# Patient Record
Sex: Male | Born: 1945 | Race: White | Hispanic: No | Marital: Married | Smoking: Former smoker
Health system: Southern US, Community
[De-identification: ages and names within clinical notes are randomized; demographics above are authoritative.]

## PROBLEM LIST (undated history)

## (undated) DIAGNOSIS — I2510 Atherosclerotic heart disease of native coronary artery without angina pectoris: Secondary | ICD-10-CM

## (undated) DIAGNOSIS — E785 Hyperlipidemia, unspecified: Secondary | ICD-10-CM

## (undated) HISTORY — PX: SKIN SURGERY: SHX2413

---

## 2010-12-16 HISTORY — PX: CORONARY ARTERY BYPASS GRAFT: SHX141

## 2011-05-02 DIAGNOSIS — I10 Essential (primary) hypertension: Secondary | ICD-10-CM | POA: Insufficient documentation

## 2011-05-07 DIAGNOSIS — I2510 Atherosclerotic heart disease of native coronary artery without angina pectoris: Secondary | ICD-10-CM | POA: Insufficient documentation

## 2011-05-28 DIAGNOSIS — Z951 Presence of aortocoronary bypass graft: Secondary | ICD-10-CM | POA: Insufficient documentation

## 2011-05-30 DIAGNOSIS — E785 Hyperlipidemia, unspecified: Secondary | ICD-10-CM | POA: Insufficient documentation

## 2013-04-14 DIAGNOSIS — R059 Cough, unspecified: Secondary | ICD-10-CM | POA: Insufficient documentation

## 2015-04-20 DIAGNOSIS — H9193 Unspecified hearing loss, bilateral: Secondary | ICD-10-CM | POA: Insufficient documentation

## 2015-04-20 DIAGNOSIS — H6123 Impacted cerumen, bilateral: Secondary | ICD-10-CM | POA: Insufficient documentation

## 2015-07-07 DIAGNOSIS — M25522 Pain in left elbow: Secondary | ICD-10-CM | POA: Insufficient documentation

## 2015-09-19 DIAGNOSIS — K759 Inflammatory liver disease, unspecified: Secondary | ICD-10-CM | POA: Insufficient documentation

## 2017-02-13 DIAGNOSIS — IMO0002 Reserved for concepts with insufficient information to code with codable children: Secondary | ICD-10-CM | POA: Insufficient documentation

## 2018-02-10 DIAGNOSIS — R972 Elevated prostate specific antigen [PSA]: Secondary | ICD-10-CM | POA: Insufficient documentation

## 2018-02-10 DIAGNOSIS — R310 Gross hematuria: Secondary | ICD-10-CM | POA: Insufficient documentation

## 2018-03-30 DIAGNOSIS — C61 Malignant neoplasm of prostate: Secondary | ICD-10-CM | POA: Insufficient documentation

## 2018-04-16 DIAGNOSIS — M542 Cervicalgia: Secondary | ICD-10-CM | POA: Insufficient documentation

## 2018-08-10 DIAGNOSIS — M25552 Pain in left hip: Secondary | ICD-10-CM | POA: Insufficient documentation

## 2019-02-25 DIAGNOSIS — M1612 Unilateral primary osteoarthritis, left hip: Secondary | ICD-10-CM | POA: Insufficient documentation

## 2019-04-12 DIAGNOSIS — M7989 Other specified soft tissue disorders: Secondary | ICD-10-CM | POA: Insufficient documentation

## 2019-06-02 DIAGNOSIS — Z96642 Presence of left artificial hip joint: Secondary | ICD-10-CM | POA: Insufficient documentation

## 2019-06-02 DIAGNOSIS — Z471 Aftercare following joint replacement surgery: Secondary | ICD-10-CM | POA: Insufficient documentation

## 2019-06-15 DIAGNOSIS — M720 Palmar fascial fibromatosis [Dupuytren]: Secondary | ICD-10-CM | POA: Insufficient documentation

## 2019-06-15 DIAGNOSIS — S99922A Unspecified injury of left foot, initial encounter: Secondary | ICD-10-CM | POA: Insufficient documentation

## 2019-10-22 DIAGNOSIS — K921 Melena: Secondary | ICD-10-CM | POA: Insufficient documentation

## 2020-01-07 DIAGNOSIS — N201 Calculus of ureter: Secondary | ICD-10-CM | POA: Insufficient documentation

## 2020-03-03 DIAGNOSIS — M6530 Trigger finger, unspecified finger: Secondary | ICD-10-CM | POA: Insufficient documentation

## 2021-10-31 DIAGNOSIS — W19XXXA Unspecified fall, initial encounter: Secondary | ICD-10-CM | POA: Insufficient documentation

## 2021-11-25 ENCOUNTER — Inpatient Hospital Stay (HOSPITAL_COMMUNITY)
Admission: EM | Admit: 2021-11-25 | Discharge: 2021-12-25 | DRG: 003 | Disposition: A | Discharge status: Rehabilitation Facility | Payer: Medicare Other | Attending: Family Medicine | Admitting: Family Medicine

## 2021-11-25 ENCOUNTER — Other Ambulatory Visit: Payer: Self-pay

## 2021-11-25 ENCOUNTER — Emergency Department (HOSPITAL_COMMUNITY): Payer: Medicare Other

## 2021-11-25 ENCOUNTER — Inpatient Hospital Stay (HOSPITAL_COMMUNITY): Payer: Medicare Other

## 2021-11-25 ENCOUNTER — Encounter (HOSPITAL_COMMUNITY): Payer: Self-pay | Admitting: Neurology

## 2021-11-25 DIAGNOSIS — F419 Anxiety disorder, unspecified: Secondary | ICD-10-CM | POA: Diagnosis not present

## 2021-11-25 DIAGNOSIS — I63531 Cerebral infarction due to unspecified occlusion or stenosis of right posterior cerebral artery: Secondary | ICD-10-CM | POA: Diagnosis not present

## 2021-11-25 DIAGNOSIS — J690 Pneumonitis due to inhalation of food and vomit: Secondary | ICD-10-CM

## 2021-11-25 DIAGNOSIS — G936 Cerebral edema: Secondary | ICD-10-CM | POA: Diagnosis not present

## 2021-11-25 DIAGNOSIS — I959 Hypotension, unspecified: Secondary | ICD-10-CM | POA: Diagnosis not present

## 2021-11-25 DIAGNOSIS — I615 Nontraumatic intracerebral hemorrhage, intraventricular: Secondary | ICD-10-CM | POA: Diagnosis not present

## 2021-11-25 DIAGNOSIS — I4892 Unspecified atrial flutter: Secondary | ICD-10-CM | POA: Diagnosis present

## 2021-11-25 DIAGNOSIS — R1311 Dysphagia, oral phase: Secondary | ICD-10-CM | POA: Diagnosis not present

## 2021-11-25 DIAGNOSIS — E162 Hypoglycemia, unspecified: Secondary | ICD-10-CM | POA: Diagnosis not present

## 2021-11-25 DIAGNOSIS — Z951 Presence of aortocoronary bypass graft: Secondary | ICD-10-CM

## 2021-11-25 DIAGNOSIS — N401 Enlarged prostate with lower urinary tract symptoms: Secondary | ICD-10-CM | POA: Diagnosis present

## 2021-11-25 DIAGNOSIS — R310 Gross hematuria: Secondary | ICD-10-CM | POA: Diagnosis not present

## 2021-11-25 DIAGNOSIS — D62 Acute posthemorrhagic anemia: Secondary | ICD-10-CM | POA: Diagnosis not present

## 2021-11-25 DIAGNOSIS — Z931 Gastrostomy status: Secondary | ICD-10-CM | POA: Diagnosis not present

## 2021-11-25 DIAGNOSIS — I639 Cerebral infarction, unspecified: Secondary | ICD-10-CM

## 2021-11-25 DIAGNOSIS — R1310 Dysphagia, unspecified: Secondary | ICD-10-CM | POA: Diagnosis not present

## 2021-11-25 DIAGNOSIS — I69393 Ataxia following cerebral infarction: Secondary | ICD-10-CM | POA: Diagnosis present

## 2021-11-25 DIAGNOSIS — J9601 Acute respiratory failure with hypoxia: Secondary | ICD-10-CM

## 2021-11-25 DIAGNOSIS — R1313 Dysphagia, pharyngeal phase: Secondary | ICD-10-CM | POA: Diagnosis present

## 2021-11-25 DIAGNOSIS — I700 Atherosclerosis of aorta: Secondary | ICD-10-CM | POA: Diagnosis present

## 2021-11-25 DIAGNOSIS — J9600 Acute respiratory failure, unspecified whether with hypoxia or hypercapnia: Secondary | ICD-10-CM

## 2021-11-25 DIAGNOSIS — I63543 Cerebral infarction due to unspecified occlusion or stenosis of bilateral cerebellar arteries: Secondary | ICD-10-CM | POA: Diagnosis present

## 2021-11-25 DIAGNOSIS — R0602 Shortness of breath: Secondary | ICD-10-CM

## 2021-11-25 DIAGNOSIS — Z20822 Contact with and (suspected) exposure to covid-19: Secondary | ICD-10-CM | POA: Diagnosis present

## 2021-11-25 DIAGNOSIS — G935 Compression of brain: Secondary | ICD-10-CM | POA: Diagnosis not present

## 2021-11-25 DIAGNOSIS — Z0189 Encounter for other specified special examinations: Secondary | ICD-10-CM

## 2021-11-25 DIAGNOSIS — K648 Other hemorrhoids: Secondary | ICD-10-CM | POA: Diagnosis present

## 2021-11-25 DIAGNOSIS — I63013 Cerebral infarction due to thrombosis of bilateral vertebral arteries: Secondary | ICD-10-CM | POA: Diagnosis not present

## 2021-11-25 DIAGNOSIS — I2510 Atherosclerotic heart disease of native coronary artery without angina pectoris: Secondary | ICD-10-CM | POA: Diagnosis present

## 2021-11-25 DIAGNOSIS — I69391 Dysphagia following cerebral infarction: Secondary | ICD-10-CM | POA: Diagnosis not present

## 2021-11-25 DIAGNOSIS — I63443 Cerebral infarction due to embolism of bilateral cerebellar arteries: Secondary | ICD-10-CM | POA: Diagnosis not present

## 2021-11-25 DIAGNOSIS — D72829 Elevated white blood cell count, unspecified: Secondary | ICD-10-CM | POA: Diagnosis not present

## 2021-11-25 DIAGNOSIS — J9602 Acute respiratory failure with hypercapnia: Secondary | ICD-10-CM | POA: Diagnosis not present

## 2021-11-25 DIAGNOSIS — R29702 NIHSS score 2: Secondary | ICD-10-CM | POA: Diagnosis present

## 2021-11-25 DIAGNOSIS — I4891 Unspecified atrial fibrillation: Secondary | ICD-10-CM | POA: Diagnosis not present

## 2021-11-25 DIAGNOSIS — R4701 Aphasia: Secondary | ICD-10-CM | POA: Diagnosis not present

## 2021-11-25 DIAGNOSIS — I6389 Other cerebral infarction: Secondary | ICD-10-CM | POA: Diagnosis not present

## 2021-11-25 DIAGNOSIS — H510 Palsy (spasm) of conjugate gaze: Secondary | ICD-10-CM | POA: Diagnosis present

## 2021-11-25 DIAGNOSIS — I110 Hypertensive heart disease with heart failure: Secondary | ICD-10-CM | POA: Diagnosis present

## 2021-11-25 DIAGNOSIS — I10 Essential (primary) hypertension: Secondary | ICD-10-CM | POA: Diagnosis not present

## 2021-11-25 DIAGNOSIS — Z930 Tracheostomy status: Secondary | ICD-10-CM

## 2021-11-25 DIAGNOSIS — R569 Unspecified convulsions: Secondary | ICD-10-CM | POA: Diagnosis not present

## 2021-11-25 DIAGNOSIS — A499 Bacterial infection, unspecified: Secondary | ICD-10-CM | POA: Diagnosis not present

## 2021-11-25 DIAGNOSIS — M1990 Unspecified osteoarthritis, unspecified site: Secondary | ICD-10-CM | POA: Diagnosis present

## 2021-11-25 DIAGNOSIS — E785 Hyperlipidemia, unspecified: Secondary | ICD-10-CM | POA: Diagnosis present

## 2021-11-25 DIAGNOSIS — Z8546 Personal history of malignant neoplasm of prostate: Secondary | ICD-10-CM

## 2021-11-25 DIAGNOSIS — E870 Hyperosmolality and hypernatremia: Secondary | ICD-10-CM | POA: Diagnosis not present

## 2021-11-25 DIAGNOSIS — G9341 Metabolic encephalopathy: Secondary | ICD-10-CM | POA: Diagnosis not present

## 2021-11-25 DIAGNOSIS — N3040 Irradiation cystitis without hematuria: Secondary | ICD-10-CM | POA: Diagnosis not present

## 2021-11-25 DIAGNOSIS — R509 Fever, unspecified: Secondary | ICD-10-CM | POA: Diagnosis not present

## 2021-11-25 DIAGNOSIS — I340 Nonrheumatic mitral (valve) insufficiency: Secondary | ICD-10-CM | POA: Diagnosis present

## 2021-11-25 DIAGNOSIS — I483 Typical atrial flutter: Secondary | ICD-10-CM | POA: Diagnosis not present

## 2021-11-25 DIAGNOSIS — Z79899 Other long term (current) drug therapy: Secondary | ICD-10-CM

## 2021-11-25 DIAGNOSIS — I69351 Hemiplegia and hemiparesis following cerebral infarction affecting right dominant side: Secondary | ICD-10-CM | POA: Diagnosis not present

## 2021-11-25 DIAGNOSIS — R410 Disorientation, unspecified: Secondary | ICD-10-CM | POA: Diagnosis not present

## 2021-11-25 DIAGNOSIS — R001 Bradycardia, unspecified: Secondary | ICD-10-CM | POA: Diagnosis not present

## 2021-11-25 DIAGNOSIS — N390 Urinary tract infection, site not specified: Secondary | ICD-10-CM | POA: Diagnosis not present

## 2021-11-25 DIAGNOSIS — Z978 Presence of other specified devices: Secondary | ICD-10-CM

## 2021-11-25 DIAGNOSIS — N210 Calculus in bladder: Secondary | ICD-10-CM | POA: Diagnosis present

## 2021-11-25 DIAGNOSIS — W19XXXA Unspecified fall, initial encounter: Secondary | ICD-10-CM | POA: Diagnosis not present

## 2021-11-25 DIAGNOSIS — E669 Obesity, unspecified: Secondary | ICD-10-CM | POA: Diagnosis present

## 2021-11-25 DIAGNOSIS — I5032 Chronic diastolic (congestive) heart failure: Secondary | ICD-10-CM | POA: Diagnosis present

## 2021-11-25 DIAGNOSIS — Z8249 Family history of ischemic heart disease and other diseases of the circulatory system: Secondary | ICD-10-CM

## 2021-11-25 DIAGNOSIS — Y842 Radiological procedure and radiotherapy as the cause of abnormal reaction of the patient, or of later complication, without mention of misadventure at the time of the procedure: Secondary | ICD-10-CM | POA: Diagnosis present

## 2021-11-25 DIAGNOSIS — Z781 Physical restraint status: Secondary | ICD-10-CM

## 2021-11-25 DIAGNOSIS — R338 Other retention of urine: Secondary | ICD-10-CM | POA: Diagnosis present

## 2021-11-25 DIAGNOSIS — K5790 Diverticulosis of intestine, part unspecified, without perforation or abscess without bleeding: Secondary | ICD-10-CM | POA: Diagnosis present

## 2021-11-25 DIAGNOSIS — I480 Paroxysmal atrial fibrillation: Secondary | ICD-10-CM | POA: Diagnosis present

## 2021-11-25 DIAGNOSIS — I160 Hypertensive urgency: Secondary | ICD-10-CM | POA: Diagnosis not present

## 2021-11-25 DIAGNOSIS — E876 Hypokalemia: Secondary | ICD-10-CM | POA: Diagnosis not present

## 2021-11-25 DIAGNOSIS — Z7982 Long term (current) use of aspirin: Secondary | ICD-10-CM | POA: Diagnosis not present

## 2021-11-25 DIAGNOSIS — Z96642 Presence of left artificial hip joint: Secondary | ICD-10-CM | POA: Diagnosis present

## 2021-11-25 DIAGNOSIS — Z6831 Body mass index (BMI) 31.0-31.9, adult: Secondary | ICD-10-CM

## 2021-11-25 DIAGNOSIS — R319 Hematuria, unspecified: Secondary | ICD-10-CM | POA: Diagnosis not present

## 2021-11-25 DIAGNOSIS — R1312 Dysphagia, oropharyngeal phase: Secondary | ICD-10-CM | POA: Diagnosis not present

## 2021-11-25 DIAGNOSIS — Z7189 Other specified counseling: Secondary | ICD-10-CM | POA: Diagnosis not present

## 2021-11-25 DIAGNOSIS — R471 Dysarthria and anarthria: Secondary | ICD-10-CM | POA: Diagnosis not present

## 2021-11-25 DIAGNOSIS — H9190 Unspecified hearing loss, unspecified ear: Secondary | ICD-10-CM | POA: Diagnosis present

## 2021-11-25 DIAGNOSIS — I6300 Cerebral infarction due to thrombosis of unspecified precerebral artery: Secondary | ICD-10-CM | POA: Diagnosis not present

## 2021-11-25 DIAGNOSIS — K921 Melena: Secondary | ICD-10-CM | POA: Diagnosis not present

## 2021-11-25 DIAGNOSIS — K625 Hemorrhage of anus and rectum: Secondary | ICD-10-CM

## 2021-11-25 DIAGNOSIS — Z9889 Other specified postprocedural states: Secondary | ICD-10-CM | POA: Diagnosis not present

## 2021-11-25 DIAGNOSIS — Z4659 Encounter for fitting and adjustment of other gastrointestinal appliance and device: Secondary | ICD-10-CM

## 2021-11-25 DIAGNOSIS — Z87891 Personal history of nicotine dependence: Secondary | ICD-10-CM

## 2021-11-25 DIAGNOSIS — F05 Delirium due to known physiological condition: Secondary | ICD-10-CM | POA: Diagnosis not present

## 2021-11-25 DIAGNOSIS — R451 Restlessness and agitation: Secondary | ICD-10-CM | POA: Diagnosis not present

## 2021-11-25 DIAGNOSIS — I69322 Dysarthria following cerebral infarction: Secondary | ICD-10-CM | POA: Diagnosis not present

## 2021-11-25 DIAGNOSIS — N029 Recurrent and persistent hematuria with unspecified morphologic changes: Secondary | ICD-10-CM | POA: Diagnosis present

## 2021-11-25 DIAGNOSIS — Z515 Encounter for palliative care: Secondary | ICD-10-CM | POA: Diagnosis not present

## 2021-11-25 DIAGNOSIS — Z809 Family history of malignant neoplasm, unspecified: Secondary | ICD-10-CM

## 2021-11-25 HISTORY — DX: Atherosclerotic heart disease of native coronary artery without angina pectoris: I25.10

## 2021-11-25 HISTORY — DX: Hyperlipidemia, unspecified: E78.5

## 2021-11-25 LAB — CBC
HCT: 48.7 % (ref 39.0–52.0)
Hemoglobin: 16.6 g/dL (ref 13.0–17.0)
MCH: 30.9 pg (ref 26.0–34.0)
MCHC: 34.1 g/dL (ref 30.0–36.0)
MCV: 90.7 fL (ref 80.0–100.0)
Platelets: 223 10*3/uL (ref 150–400)
RBC: 5.37 MIL/uL (ref 4.22–5.81)
RDW: 14.3 % (ref 11.5–15.5)
WBC: 6.8 10*3/uL (ref 4.0–10.5)
nRBC: 0 % (ref 0.0–0.2)

## 2021-11-25 LAB — URINALYSIS, ROUTINE W REFLEX MICROSCOPIC
Bacteria, UA: NONE SEEN
Bilirubin Urine: NEGATIVE
Glucose, UA: 50 mg/dL — AB
Ketones, ur: 20 mg/dL — AB
Leukocytes,Ua: NEGATIVE
Nitrite: NEGATIVE
Protein, ur: NEGATIVE mg/dL
Specific Gravity, Urine: 1.033 — ABNORMAL HIGH (ref 1.005–1.030)
pH: 6 (ref 5.0–8.0)

## 2021-11-25 LAB — COMPREHENSIVE METABOLIC PANEL
ALT: 29 U/L (ref 0–44)
AST: 31 U/L (ref 15–41)
Albumin: 3.9 g/dL (ref 3.5–5.0)
Alkaline Phosphatase: 86 U/L (ref 38–126)
Anion gap: 12 (ref 5–15)
BUN: 18 mg/dL (ref 8–23)
CO2: 22 mmol/L (ref 22–32)
Calcium: 9.1 mg/dL (ref 8.9–10.3)
Chloride: 105 mmol/L (ref 98–111)
Creatinine, Ser: 1.02 mg/dL (ref 0.61–1.24)
GFR, Estimated: >60 mL/min (ref >60)
Glucose, Bld: 134 mg/dL — ABNORMAL HIGH (ref 70–99)
Potassium: 3.8 mmol/L (ref 3.5–5.1)
Sodium: 139 mmol/L (ref 135–145)
Total Bilirubin: 1.2 mg/dL (ref 0.3–1.2)
Total Protein: 6.9 g/dL (ref 6.5–8.1)

## 2021-11-25 LAB — ABO/RH: ABO/RH(D): A POS

## 2021-11-25 LAB — DIFFERENTIAL
Abs Immature Granulocytes: 0.02 10*3/uL (ref 0.00–0.07)
Basophils Absolute: 0.1 10*3/uL (ref 0.0–0.1)
Basophils Relative: 1 %
Eosinophils Absolute: 0.2 10*3/uL (ref 0.0–0.5)
Eosinophils Relative: 3 %
Immature Granulocytes: 0 %
Lymphocytes Relative: 37 %
Lymphs Abs: 2.5 10*3/uL (ref 0.7–4.0)
Monocytes Absolute: 0.7 10*3/uL (ref 0.1–1.0)
Monocytes Relative: 10 %
Neutro Abs: 3.3 10*3/uL (ref 1.7–7.7)
Neutrophils Relative %: 49 %

## 2021-11-25 LAB — I-STAT CHEM 8, ED
BUN: 21 mg/dL (ref 8–23)
Calcium, Ion: 1 mmol/L — ABNORMAL LOW (ref 1.15–1.40)
Chloride: 108 mmol/L (ref 98–111)
Creatinine, Ser: 0.9 mg/dL (ref 0.61–1.24)
Glucose, Bld: 135 mg/dL — ABNORMAL HIGH (ref 70–99)
HCT: 49 % (ref 39.0–52.0)
Hemoglobin: 16.7 g/dL (ref 13.0–17.0)
Potassium: 3.6 mmol/L (ref 3.5–5.1)
Sodium: 140 mmol/L (ref 135–145)
TCO2: 23 mmol/L (ref 22–32)

## 2021-11-25 LAB — TYPE AND SCREEN
ABO/RH(D): A POS
Antibody Screen: NEGATIVE

## 2021-11-25 LAB — APTT: aPTT: 26 seconds (ref 24–36)

## 2021-11-25 LAB — RESP PANEL BY RT-PCR (FLU A&B, COVID) ARPGX2
Influenza A by PCR: NEGATIVE
Influenza B by PCR: NEGATIVE
SARS Coronavirus 2 by RT PCR: NEGATIVE

## 2021-11-25 LAB — RAPID URINE DRUG SCREEN, HOSP PERFORMED
Amphetamines: NOT DETECTED
Barbiturates: NOT DETECTED
Benzodiazepines: NOT DETECTED
Cocaine: NOT DETECTED
Opiates: NOT DETECTED
Tetrahydrocannabinol: NOT DETECTED

## 2021-11-25 LAB — PROTIME-INR
INR: 1 (ref 0.8–1.2)
Prothrombin Time: 13.2 seconds (ref 11.4–15.2)

## 2021-11-25 LAB — CBG MONITORING, ED
Glucose-Capillary: 132 mg/dL — ABNORMAL HIGH (ref 70–99)
Glucose-Capillary: 141 mg/dL — ABNORMAL HIGH (ref 70–99)

## 2021-11-25 LAB — MRSA NEXT GEN BY PCR, NASAL: MRSA by PCR Next Gen: NOT DETECTED

## 2021-11-25 IMAGING — MR MR HEAD W/O CM
6 of 10 series · 29 of 48 positions shown · non-contrast
Comparison: CT head [DATE]

CLINICAL DATA: Stroke.

EXAM:
MRI HEAD WITHOUT CONTRAST
TECHNIQUE: Multiplanar, multiecho pulse sequences of the brain and surrounding
structures were obtained without intravenous contrast.

[Series 2: DWI · axial · 3.0mm · 0.94mm/px · z∈[-64,+89]mm · 9 of 104 slices shown (1 of 2)]
[im 1/104]
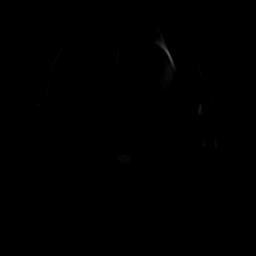
[im 13/104]
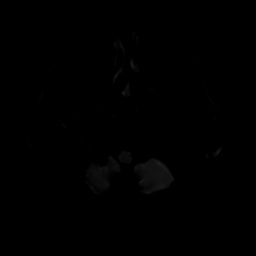
[im 26/104]
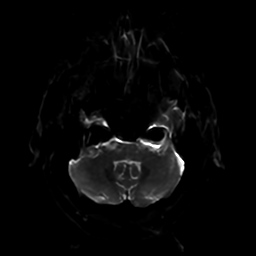
[im 39/104]
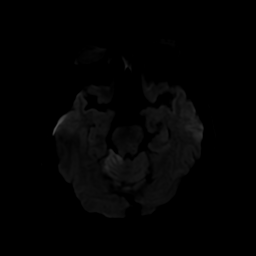
[im 52/104]
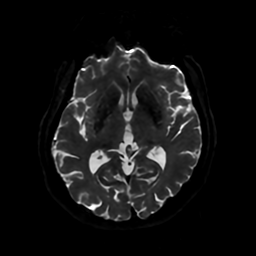
[im 65/104]
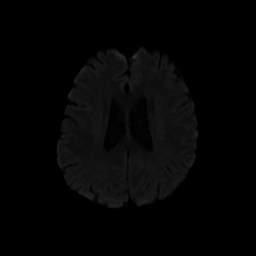
[im 78/104]
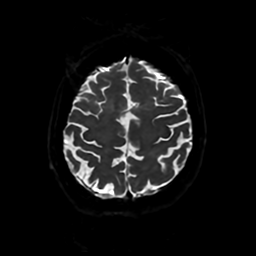
[im 91/104]
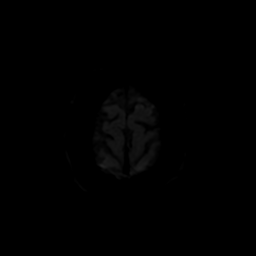
[im 104/104]
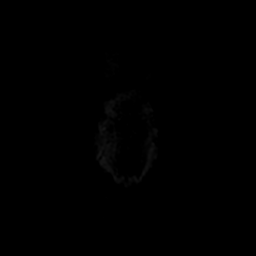

[Series 3: DWI · coronal · 4.0mm · 0.94mm/px · 7 of 74 slices shown (2 of 2)]
[im 1/74]
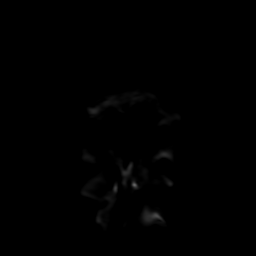
[im 13/74]
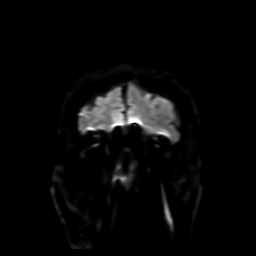
[im 25/74]
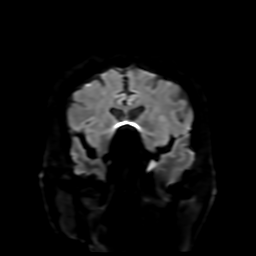
[im 37/74]
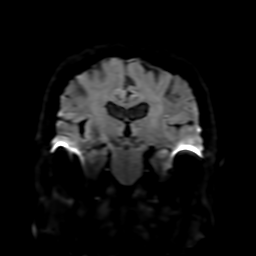
[im 49/74]
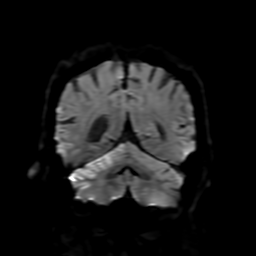
[im 61/74]
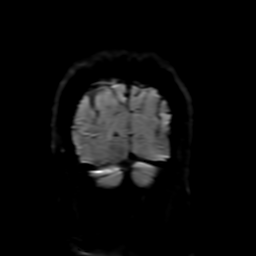
[im 74/74]
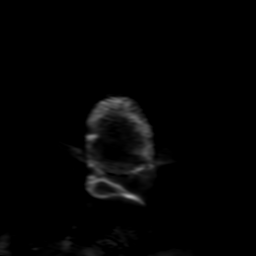

[Series 4: FLAIR · axial · 4.0mm · 0.45mm/px · z∈[-62,+87]mm · 3 of 35 slices shown (1 of 2)]
[im 1/35]
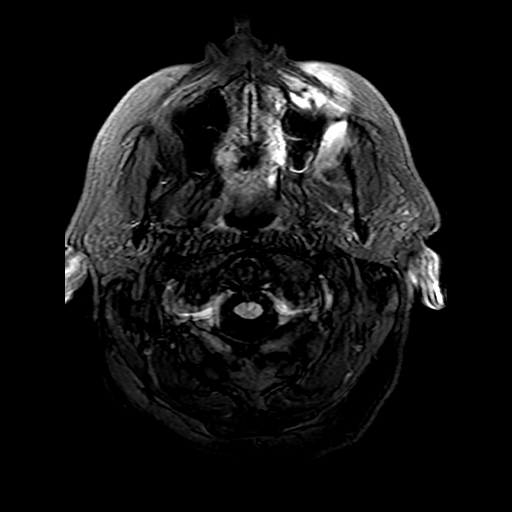
[im 18/35]
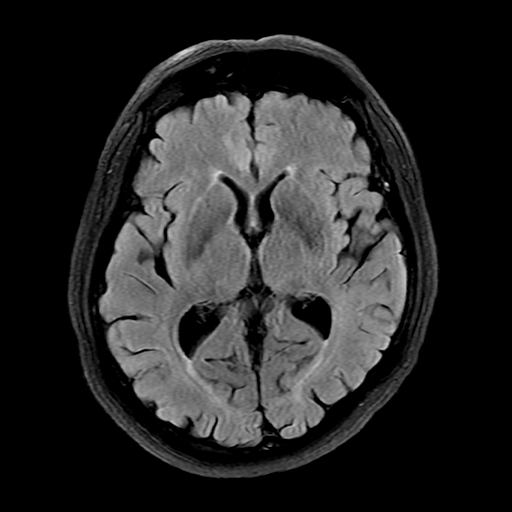
[im 35/35]
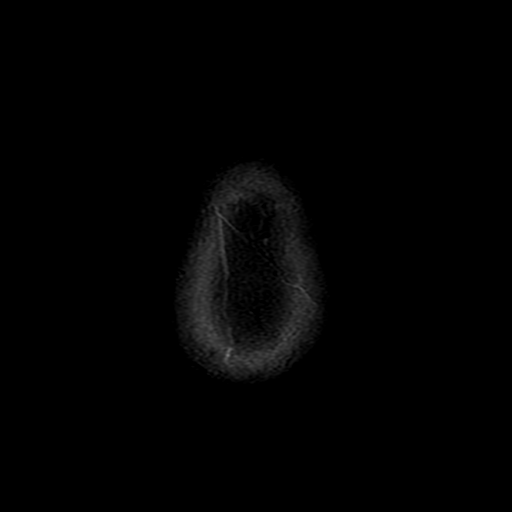

[Series 6: FLAIR · sagittal · 5.0mm · 0.23mm/px · 2 of 25 slices shown (2 of 2)]
[im 1/25]
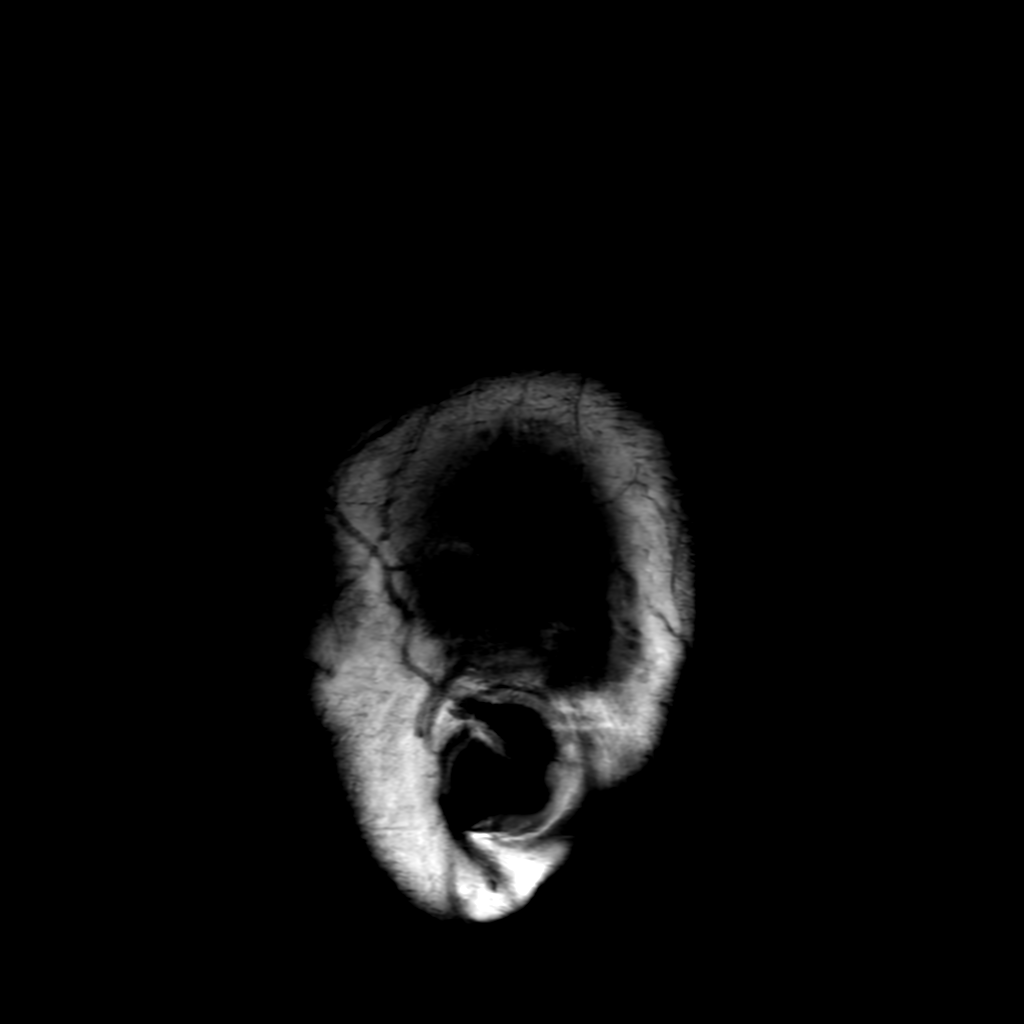
[im 25/25]
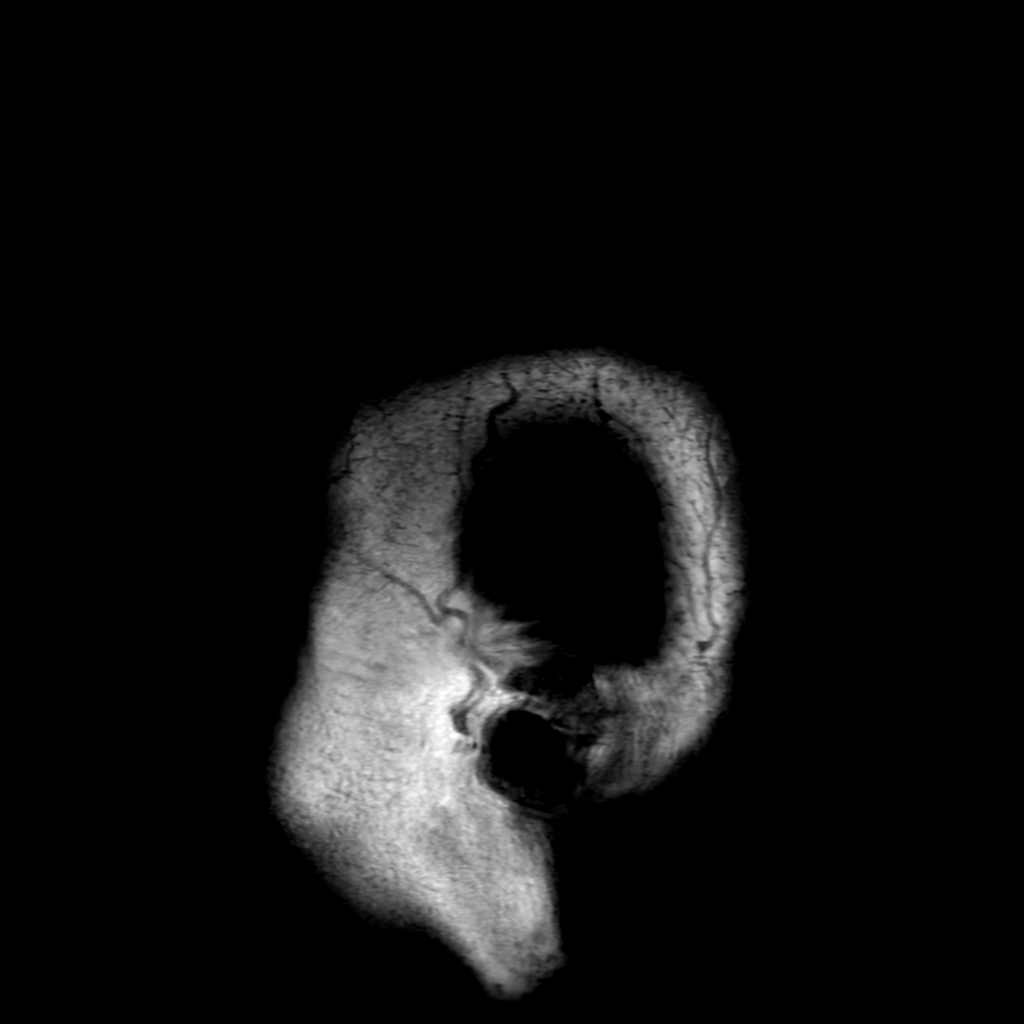

[Series 250: ADC · axial · 3.0mm · 0.94mm/px · z∈[-64,+89]mm · 5 of 52 slices shown (1 of 2)]
[im 1/52]
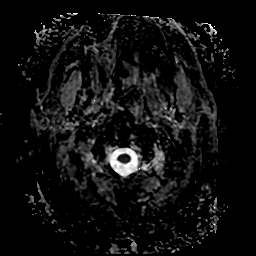
[im 13/52]
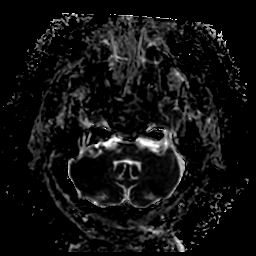
[im 26/52]
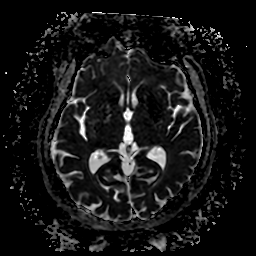
[im 39/52]
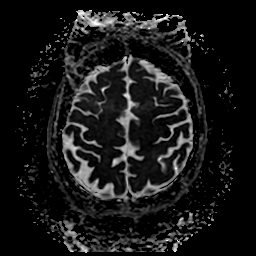
[im 52/52]
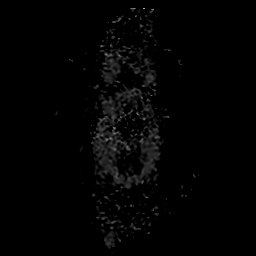

[Series 350: ADC · coronal · 4.0mm · 0.94mm/px · 3 of 34 slices shown (2 of 2)]
[im 1/34]
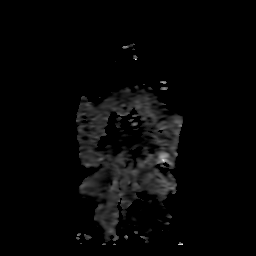
[im 17/34]
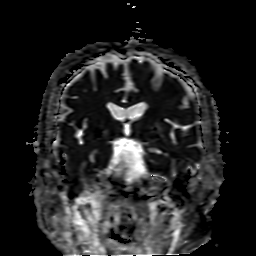
[im 34/34]
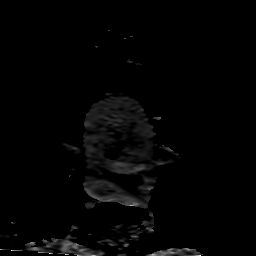

[29 of 48 positions shown; findings below may reference images not displayed]

FINDINGS: Brain: Acute infarct right superior cerebellum. Acute infarct left
posterior lateral cerebellum. No associated hemorrhage.

Ventricle size normal for age. Negative for hemorrhage or mass. No
significant chronic ischemic change.

Vascular: Normal arterial flow voids at the skull base.

Skull and upper cervical spine: Negative

Sinuses/Orbits: Minimal mucosal edema paranasal sinuses. Negative
orbit

Other: None
IMPRESSION: Acute infarct in the cerebellum bilaterally. Negative for
hemorrhage.

## 2021-11-25 IMAGING — CT CT ANGIO HEAD-NECK (W OR W/O PERF)
1 of 8 series · 14 of 47 positions shown · IV contrast (omnipaque)
Comparison: MRI head [DATE].  CT head [DATE]

CLINICAL DATA: Acute neuro deficit. Cerebellar infarct bilaterally
on MRI today.

EXAM:
CT ANGIOGRAPHY HEAD AND NECK
TECHNIQUE: Multidetector CT imaging of the head and neck was performed using
the standard protocol during bolus administration of intravenous
contrast. Multiplanar CT image reconstructions and MIPs were
obtained to evaluate the vascular anatomy. Carotid stenosis
measurements (when applicable) are obtained utilizing NASCET
criteria, using the distal internal carotid diameter as the
denominator.
CONTRAST:  75mL OMNIPAQUE IOHEXOL 350 MG/ML SOLN

[Series 12: thin · axial · 0.59mm/px · z∈[-420,-105]mm · 14 of 728 slices shown]
[im 49/728  brain]
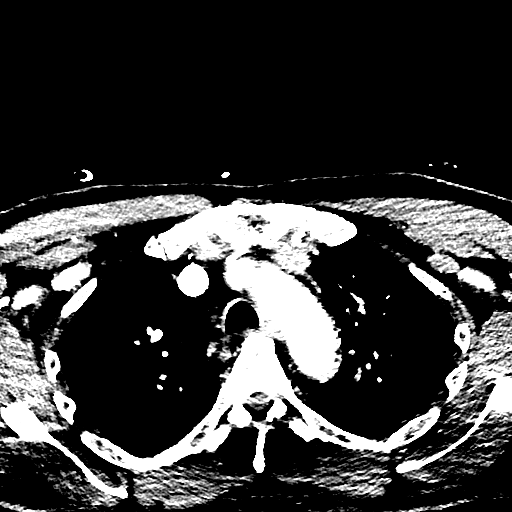
[im 97/728  bone]
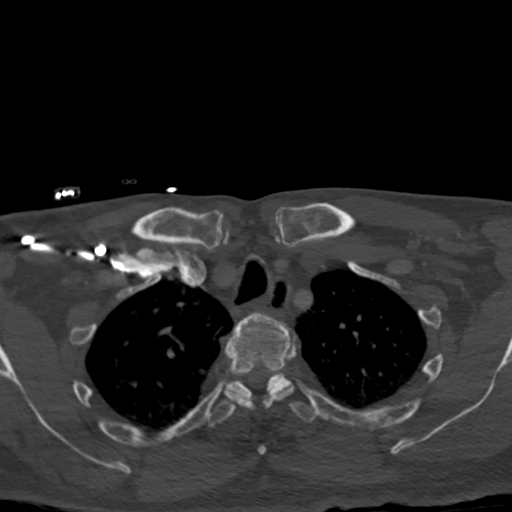
[im 146/728  brain]
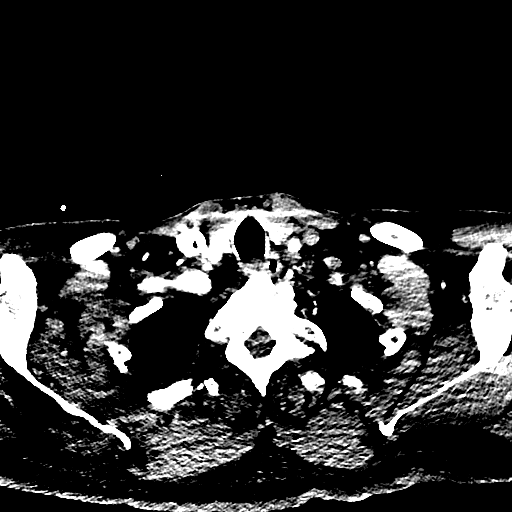
[im 194/728  bone]
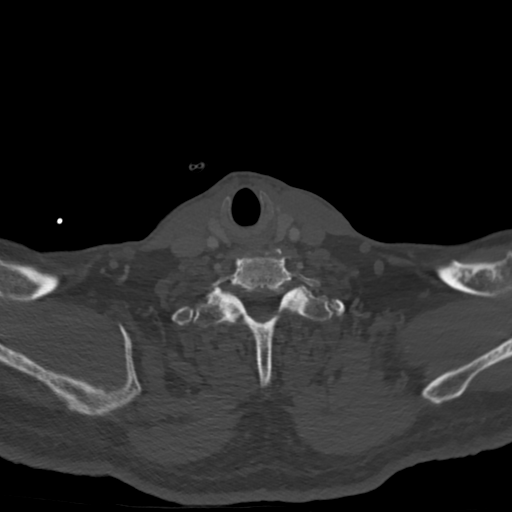
[im 243/728  brain]
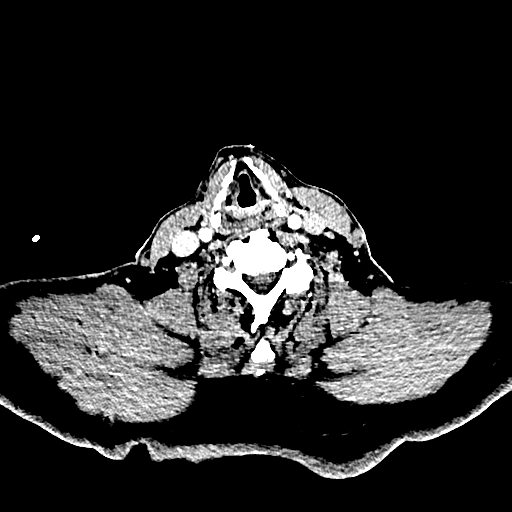
[im 291/728  bone]
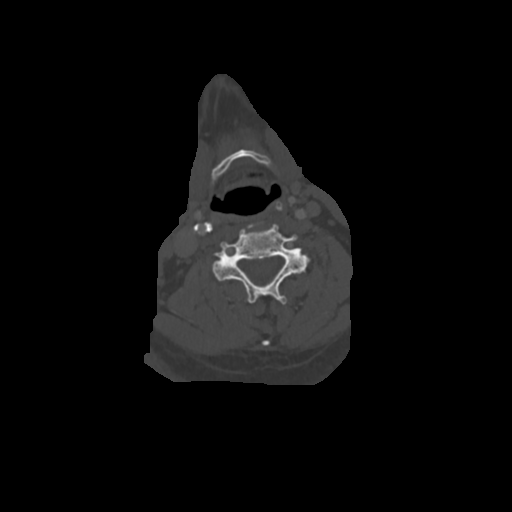
[im 340/728  brain]
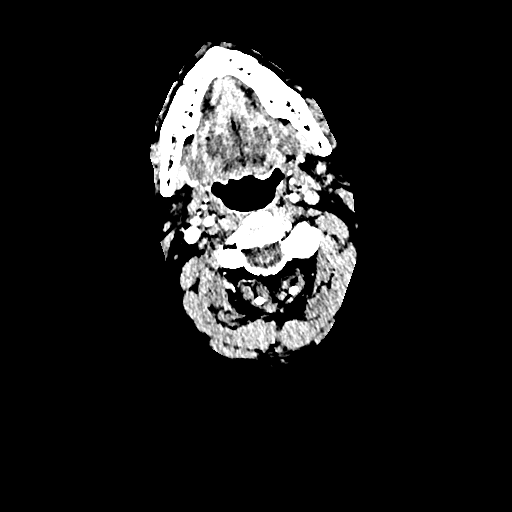
[im 388/728  bone]
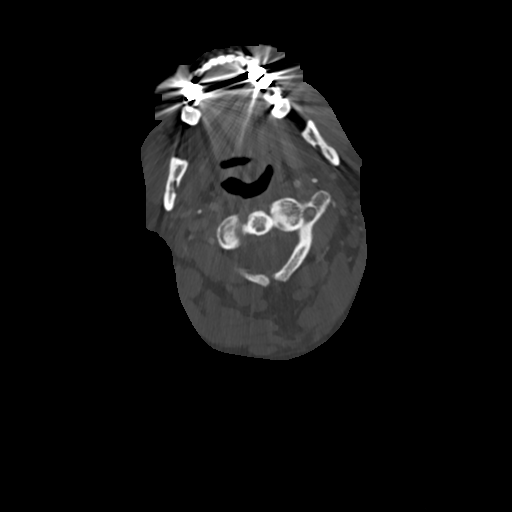
[im 437/728  brain]
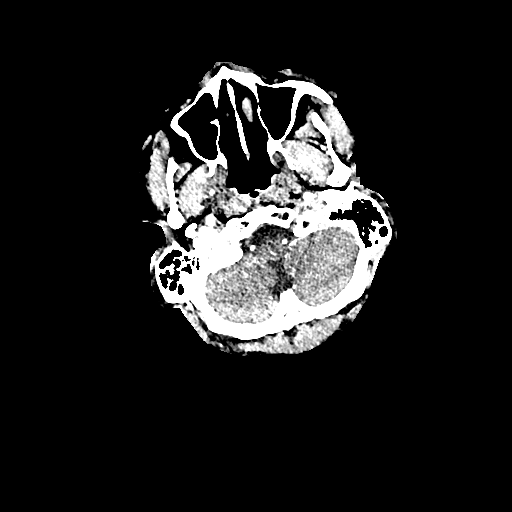
[im 485/728  bone]
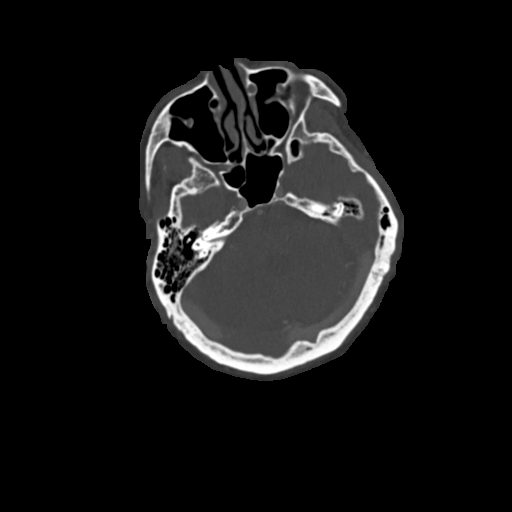
[im 534/728  brain]
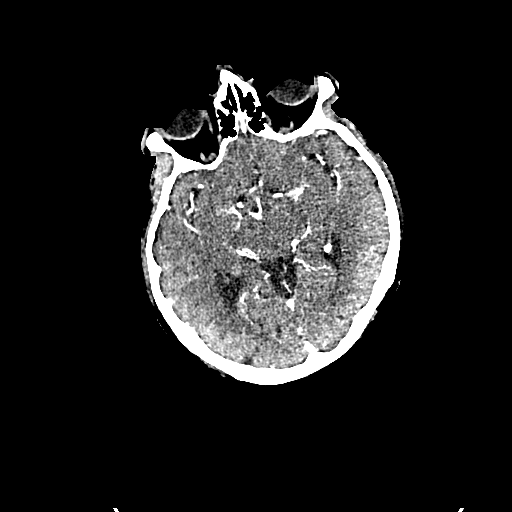
[im 582/728  bone]
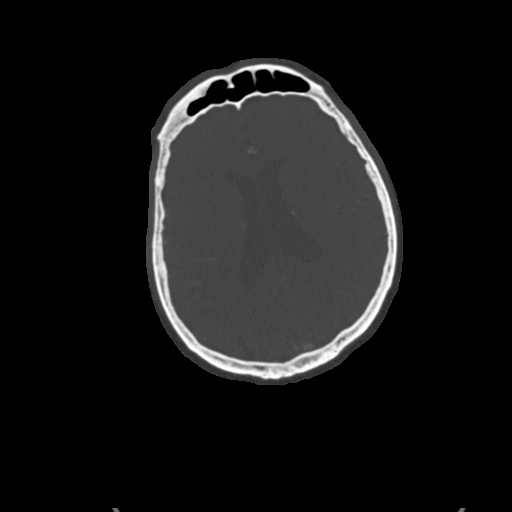
[im 631/728  brain]
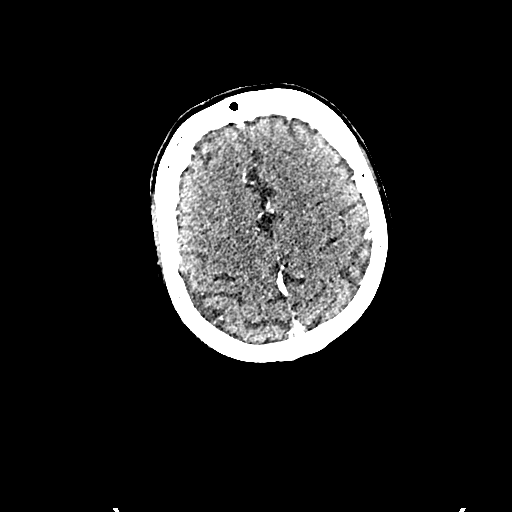
[im 679/728  bone]
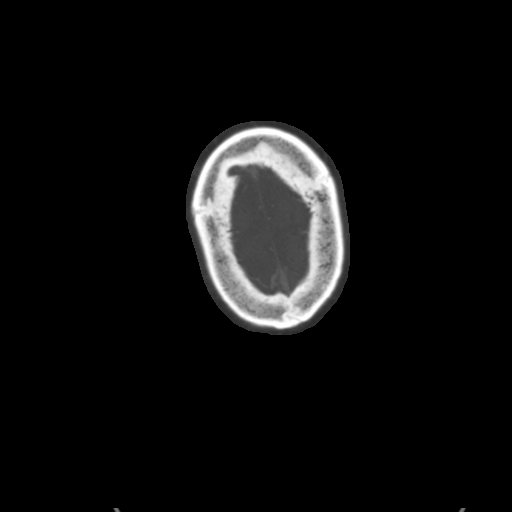

[14 of 47 positions shown; findings below may reference images not displayed]

FINDINGS: CTA NECK FINDINGS

Aortic arch: Suboptimal arterial opacification due to scan timing.

Minimal atherosclerotic calcification aortic arch. Proximal great
vessels widely patent.

Right carotid system: Mild atherosclerotic calcification right
carotid bifurcation without significant stenosis.

Left carotid system: Mild atherosclerotic calcification left carotid
bifurcation without significant stenosis.

Vertebral arteries: Both vertebral arteries widely patent without
significant stenosis.

Skeleton: Mild degenerative change cervical spine. No acute skeletal
abnormality.

Other neck: Mild degenerative change cervical spine. No acute
skeletal abnormality.

Upper chest: Lung apices clear bilaterally.

Review of the MIP images confirms the above findings

CTA HEAD FINDINGS

Anterior circulation: Internal carotid artery widely patent through
the skull base and cavernous segments. Anterior and middle cerebral
arteries widely patent and normal bilaterally.

Posterior circulation: Suboptimal arterial opacification due to scan
timing.

Both vertebral arteries patent to the basilar. PICA patent
bilaterally. Basilar widely patent without significant stenosis or
thrombus. Mild atherosclerotic irregularity. Superior cerebellar
arteries patent bilaterally. Proximal left AICA patent. Right AICA
not visualized.

Venous sinuses: Normal venous enhancement.

Anatomic variants: None

Review of the MIP images confirms the above findings
IMPRESSION: 1. Negative for intracranial large vessel occlusion. No significant
basilar stenosis or thrombus.
2. Suboptimal arterial opacification due to scan timing.
3. Mild atherosclerotic disease in the carotid bifurcation
bilaterally. Negative for carotid or vertebral artery stenosis.

## 2021-11-25 IMAGING — CT CT CTA ABD/PEL W/CM AND/OR W/O CM
3 of 13 series · 11 of 46 positions shown, 17 images · IV contrast (APPLIED)
Comparison: None.

CLINICAL DATA: GI bleed

EXAM:
CTA ABDOMEN AND PELVIS WITHOUT AND WITH CONTRAST
TECHNIQUE: Multidetector CT imaging of the abdomen and pelvis was performed
using the standard protocol during bolus administration of
intravenous contrast. Multiplanar reconstructed images and MIPs were
obtained and reviewed to evaluate the vascular anatomy.
CONTRAST:  100mL OMNIPAQUE IOHEXOL 350 MG/ML SOLN

[Series 10: arterial 3.0 i40f 2 (person_name) · axial · arterial · 0.92mm/px · z∈[+782,+944]mm · 4 of 163 slices shown]
[im 14/163  soft-tissue]
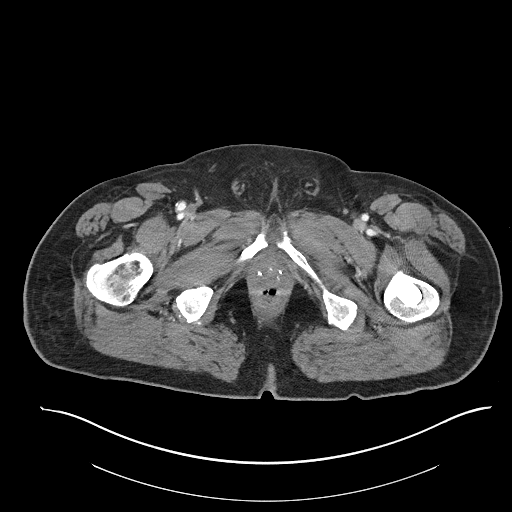
[im 41/163  soft-tissue]
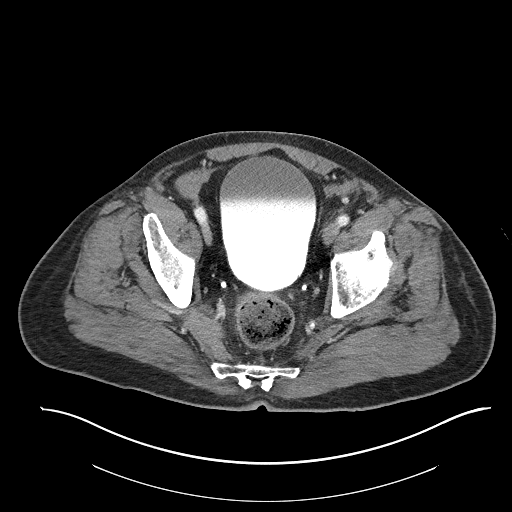
[im 55/163  soft-tissue]
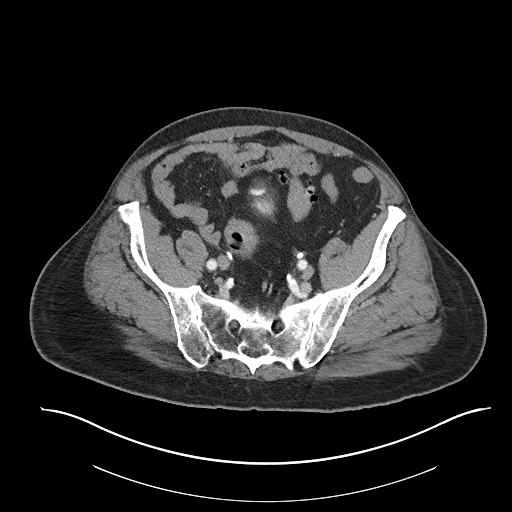
[im 68/163  soft-tissue]
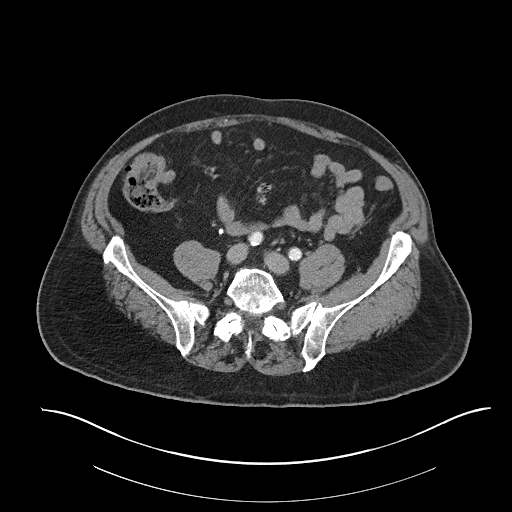

[Series 12: coronals · coronal · 0.89mm/px · 1 of 148 slices shown, 2 images]
[im 74/148  soft-tissue]
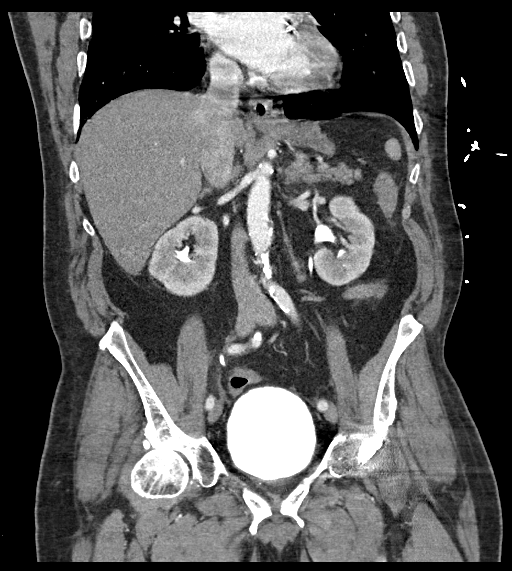
[im 74/148  bone]
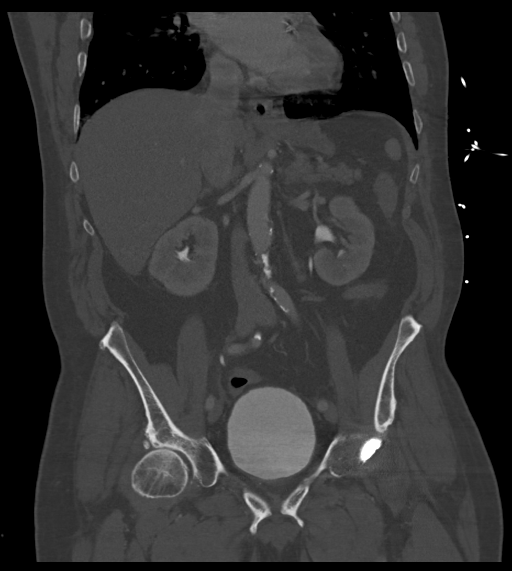

[Series 16: venous 5.0 i30f 1 (person_name) · axial · portal-venous · 0.85mm/px · z∈[+809,+1159]mm · 6 of 98 slices shown, 11 images]
[im 14/98  soft-tissue]
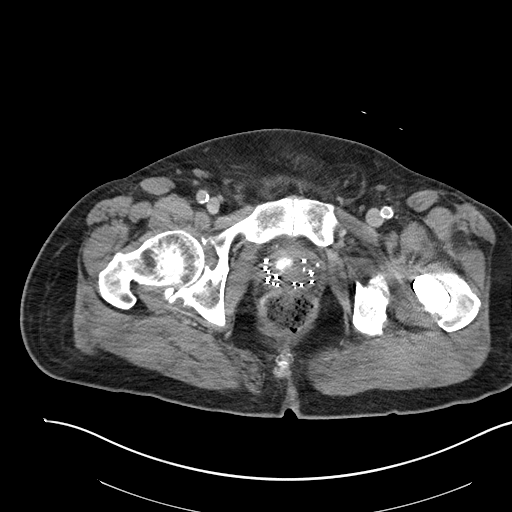
[im 14/98  bone]
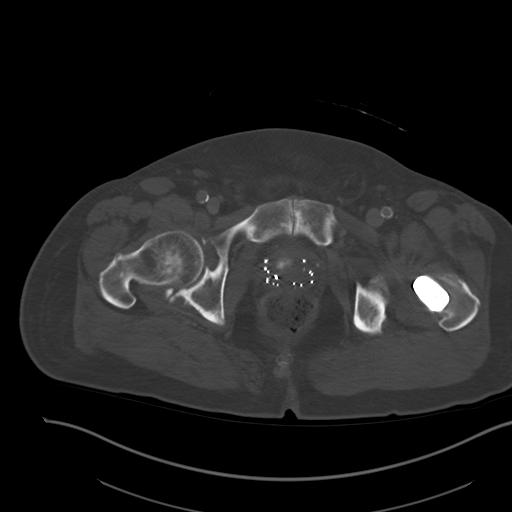
[im 28/98  soft-tissue]
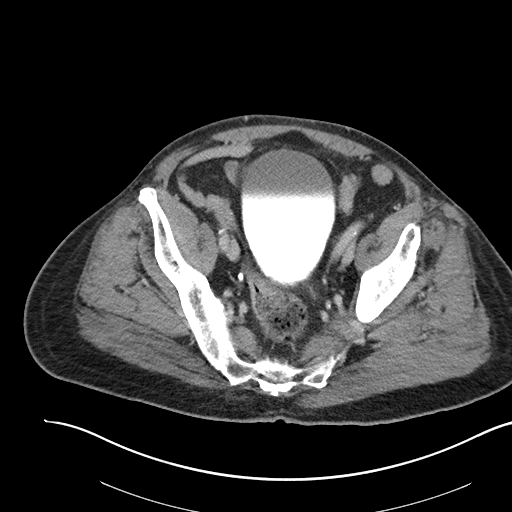
[im 42/98  soft-tissue]
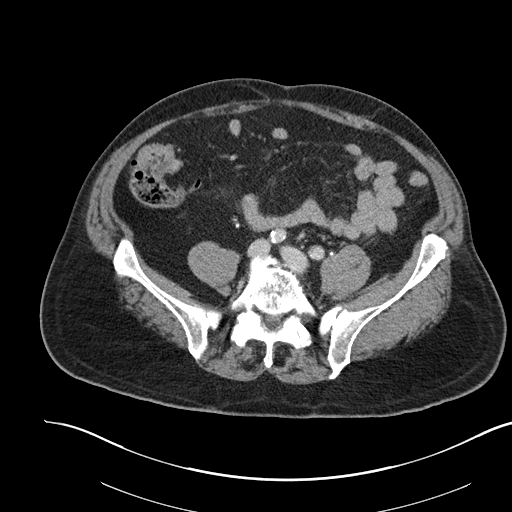
[im 42/98  lung]
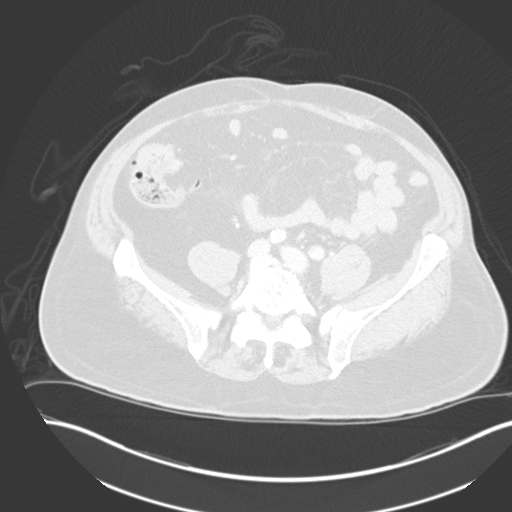
[im 56/98  soft-tissue]
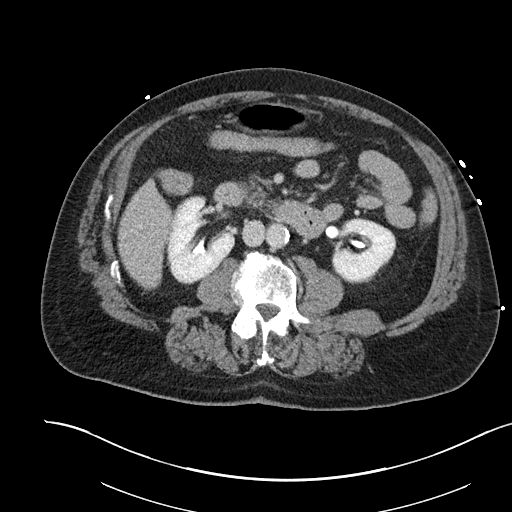
[im 56/98  lung]
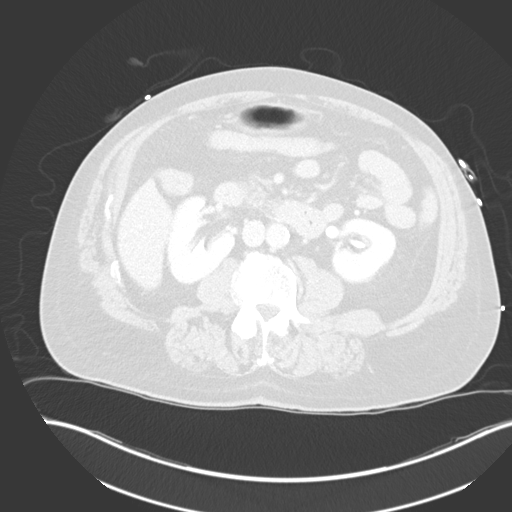
[im 70/98  soft-tissue]
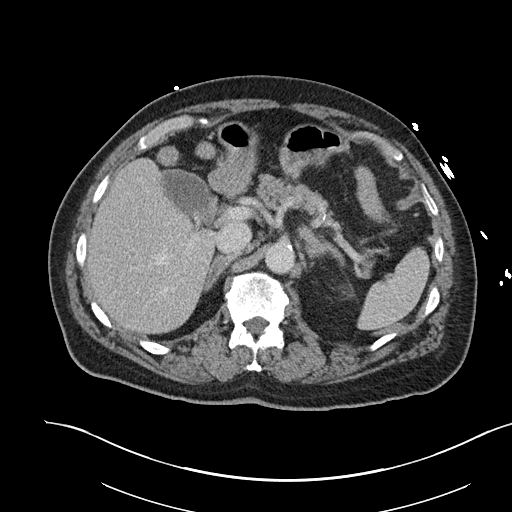
[im 70/98  lung]
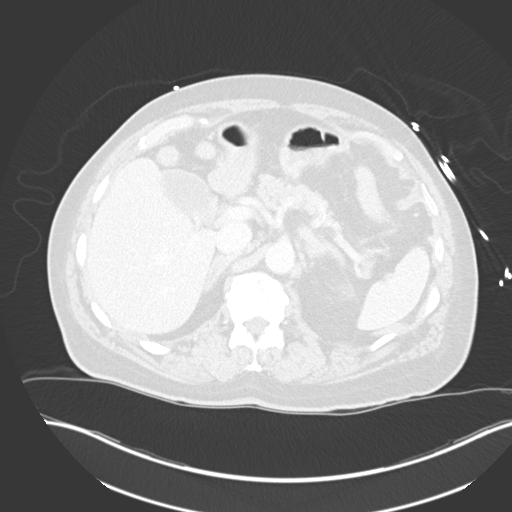
[im 84/98  soft-tissue]
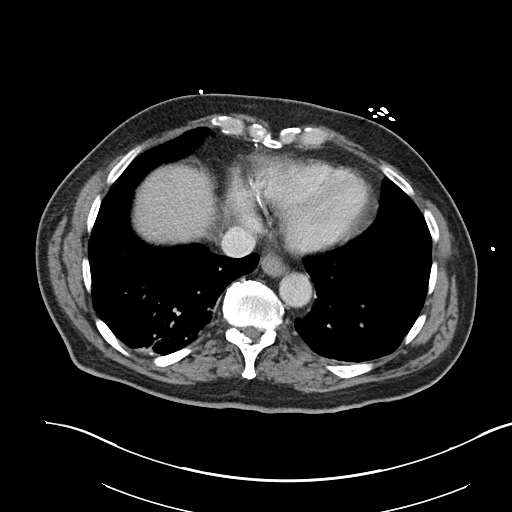
[im 84/98  lung]
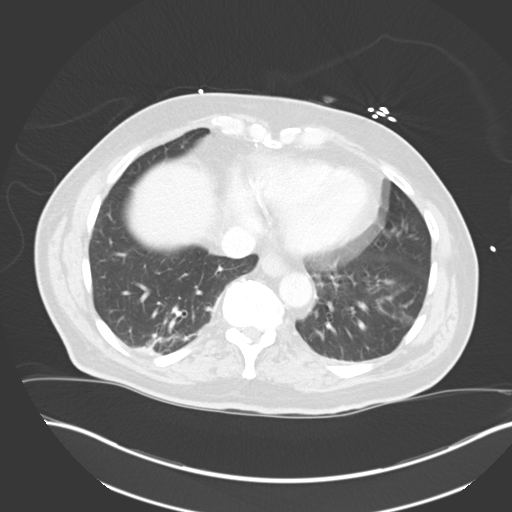

[11 of 46 positions shown; findings below may reference images not displayed]

FINDINGS: VASCULAR

Aorta: Normal caliber aorta without aneurysm, dissection, vasculitis
or significant stenosis. Moderate atherosclerotic disease of the
abdominal aorta consisting of calcified and noncalcified plaque.

Celiac: Patent without evidence of aneurysm, dissection, vasculitis
or significant stenosis.

SMA: Patent without evidence of aneurysm, dissection, vasculitis or
significant stenosis.

Renals: Both renal arteries are patent without evidence of aneurysm,
dissection, vasculitis, fibromuscular dysplasia or significant
stenosis.

IMA: Patent without evidence of aneurysm, dissection, vasculitis or
significant stenosis.

Inflow: Patent without evidence of aneurysm, dissection, vasculitis
or significant stenosis. Mild scattered calcified and noncalcified
plaque.

Proximal Outflow: Bilateral common femoral and visualized portions
of the superficial and profunda femoral arteries are patent without
evidence of aneurysm, dissection, vasculitis or significant
stenosis.

Veins: No obvious venous abnormality within the limitations of this
arterial phase study.

Review of the MIP images confirms the above findings.

NON-VASCULAR

Lower chest: Right greater than left bibasilar opacities, likely due
to scarring or atelectasis. Coronary artery calcifications.

Hepatobiliary: Low-attenuation lesions of the left lobe of the liver
which are likely simple cysts. No suspicious hepatic lesions.
Gallbladder is unremarkable. No biliary ductal dilation.

Pancreas: Unremarkable. No pancreatic ductal dilatation or
surrounding inflammatory changes.

Spleen: Normal in size without focal abnormality.

Adrenals/Urinary Tract: Bilateral adrenal glands are unremarkable.
Kidneys enhance symmetrically with no evidence of hydronephrosis or
nephrolithiasis. No suspicious filling defects of the opacified
renal collecting systems. Bladder is unremarkable.

Stomach/Bowel: Small hiatal hernia. Stomach is otherwise
unremarkable. Diverticulosis. Normal appearance of the gallbladder.
No bowel wall thickening, inflammatory change or evidence of
obstruction. Arterial and venous phase images demonstrate no
evidence of active GI bleed.

Lymphatic: No significant vascular findings are present. No enlarged
abdominal or pelvic lymph nodes.

Reproductive: Brachytherapy seeds noted in the prostate.

Other: Small fat containing left inguinal hernia.

Musculoskeletal: Prior left total hip arthroplasty. No aggressive
appearing osseous lesions.
IMPRESSION: VASCULAR

1. No evidence of active GI bleed.
2. Moderate aortic Atherosclerosis ([81]-[81]).

NON-VASCULAR

1. Diverticulosis with no evidence of diverticulitis.

## 2021-11-25 IMAGING — CT CT HEAD CODE STROKE
3 series · 15 of 47 positions shown, 18 images · non-contrast
Comparison: None.

CLINICAL DATA: Code stroke. 75-year-old male with dizziness and
abnormal gait.

EXAM:
CT HEAD WITHOUT CONTRAST
TECHNIQUE: Contiguous axial images were obtained from the base of the skull
through the vertex without intravenous contrast.

[Series 4: head 5.0 h30s · axial · 0.46mm/px · z∈[-240,-110]mm · 9 of 32 slices shown, 12 images]
[im 3/32  brain]
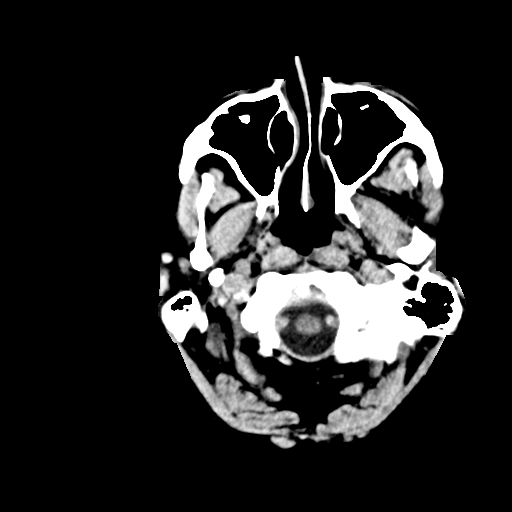
[im 3/32  bone]
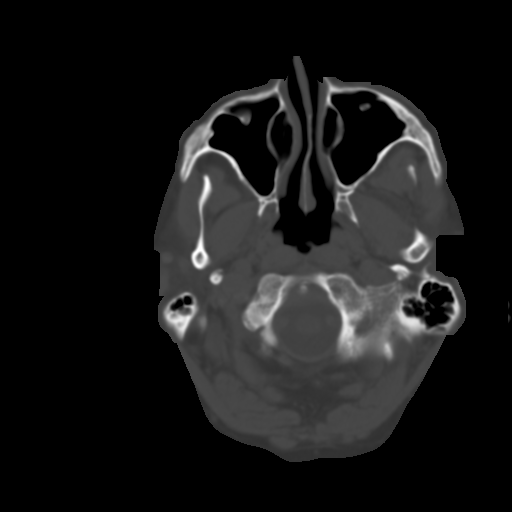
[im 6/32  brain]
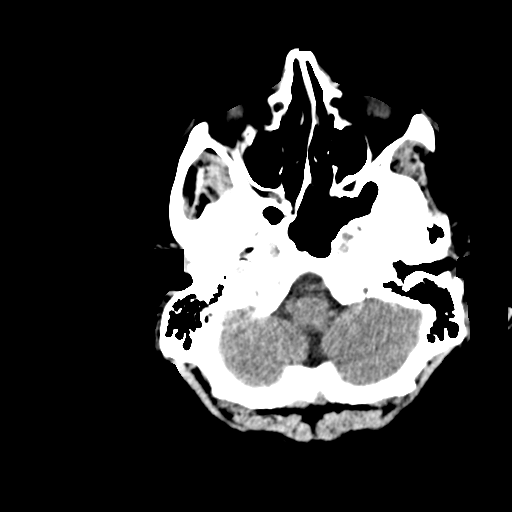
[im 9/32  brain]
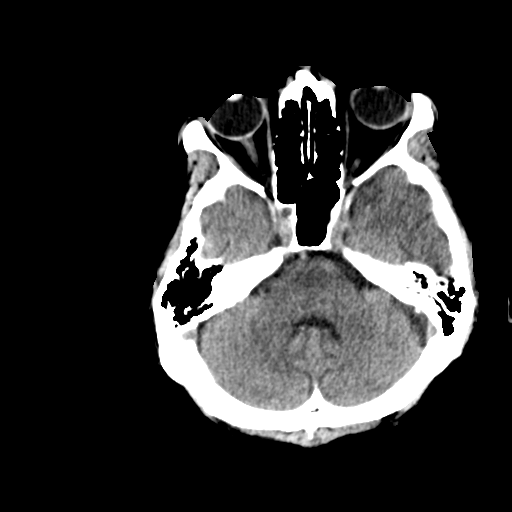
[im 12/32  brain]
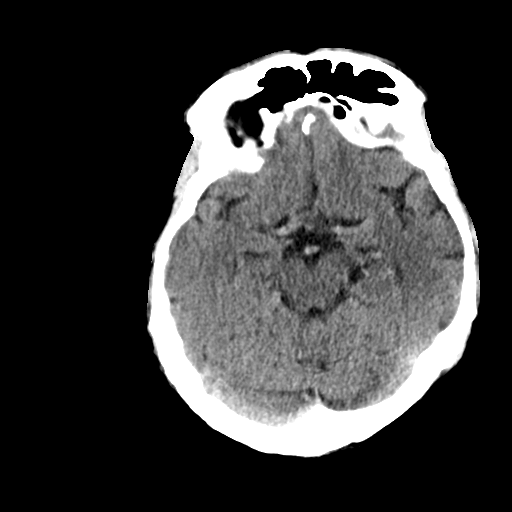
[im 17/32  brain]
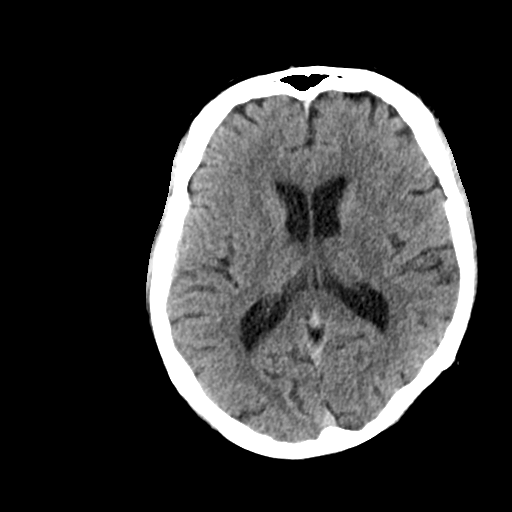
[im 17/32  bone]
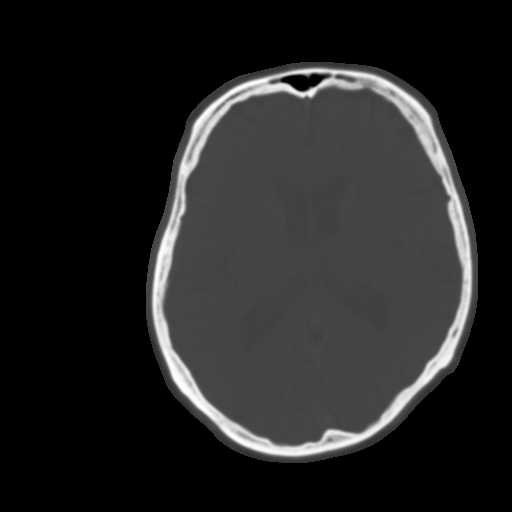
[im 20/32  brain]
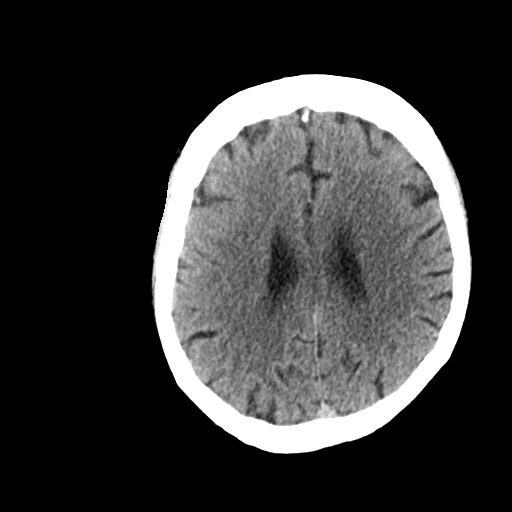
[im 23/32  brain]
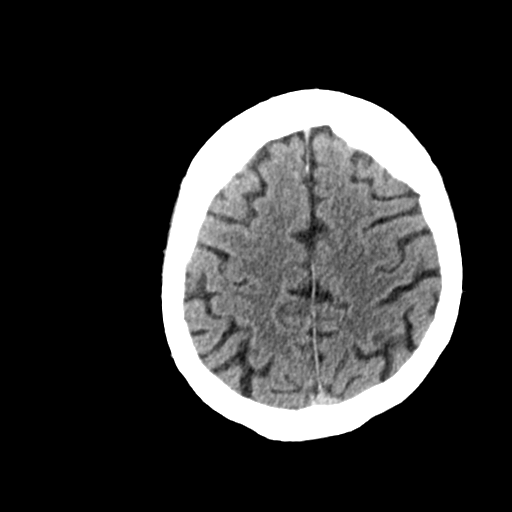
[im 26/32  brain]
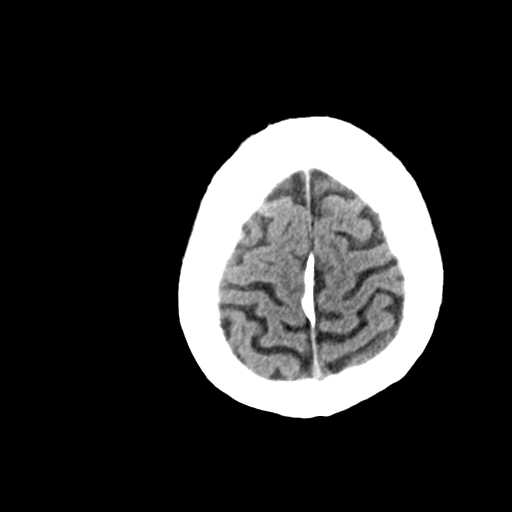
[im 29/32  brain]
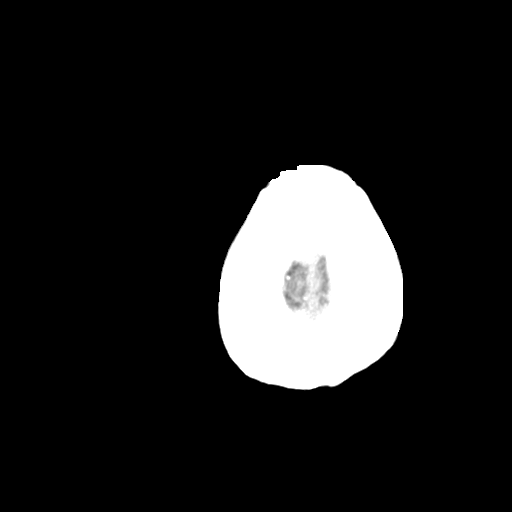
[im 29/32  bone]
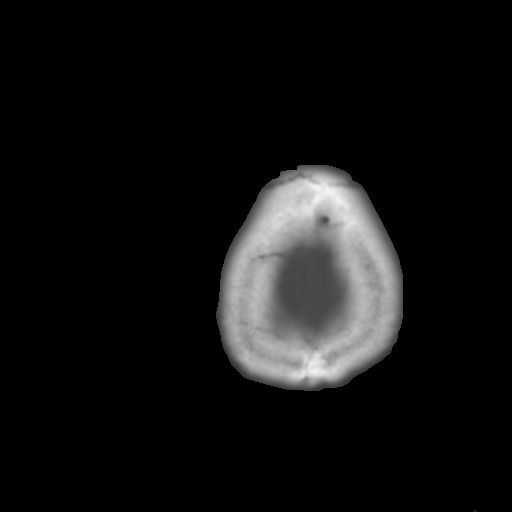

[Series 5: head 3.0 mpr cor · coronal · 0.33mm/px · 3 of 69 slices shown]
[im 23/69  brain]
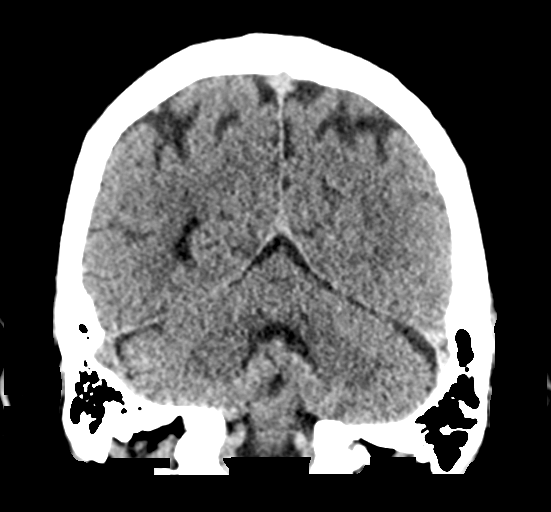
[im 31/69  brain]
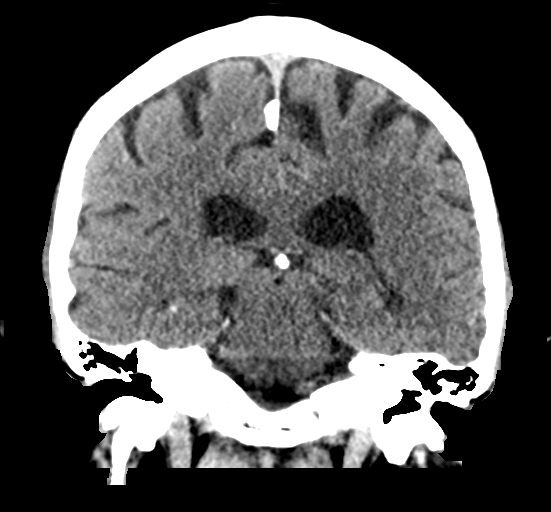
[im 38/69  brain]
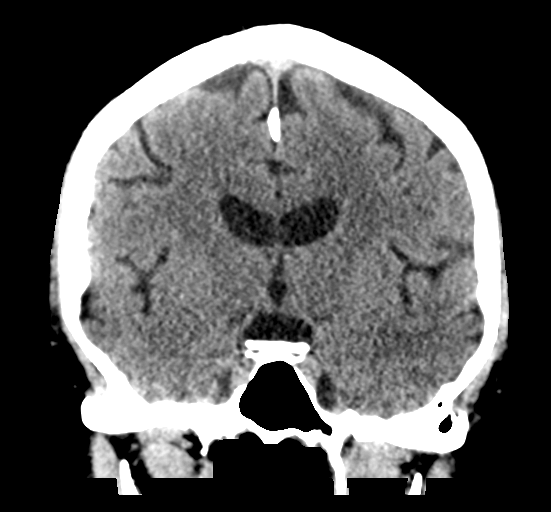

[Series 6: head 3.0 mpr sag · sagittal · 0.32mm/px · 3 of 60 slices shown]
[im 20/60  brain]
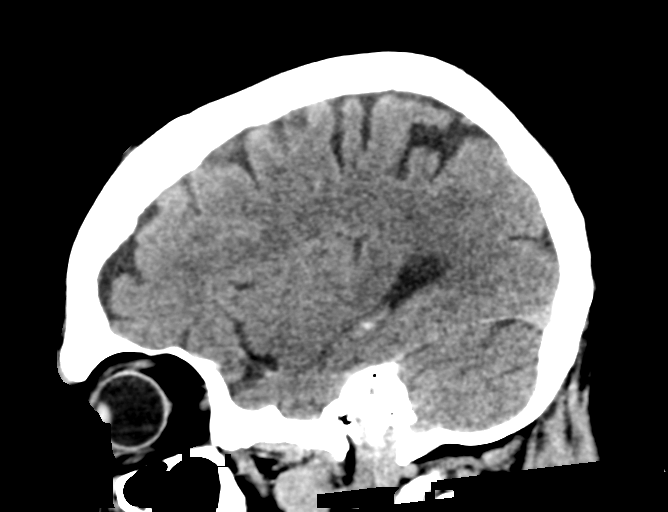
[im 30/60  brain]
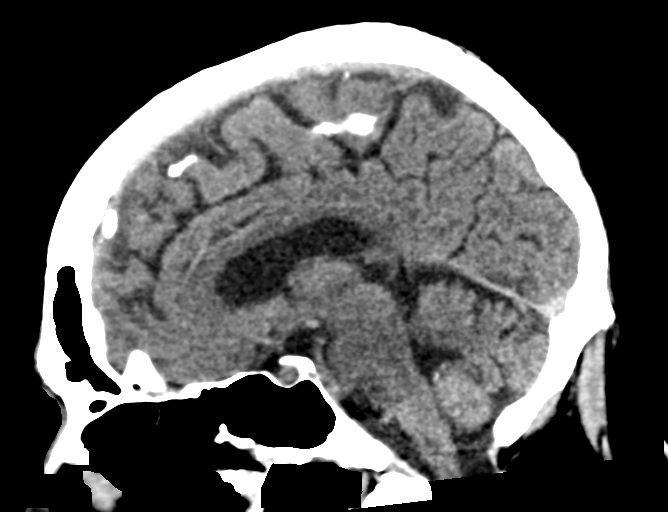
[im 40/60  brain]
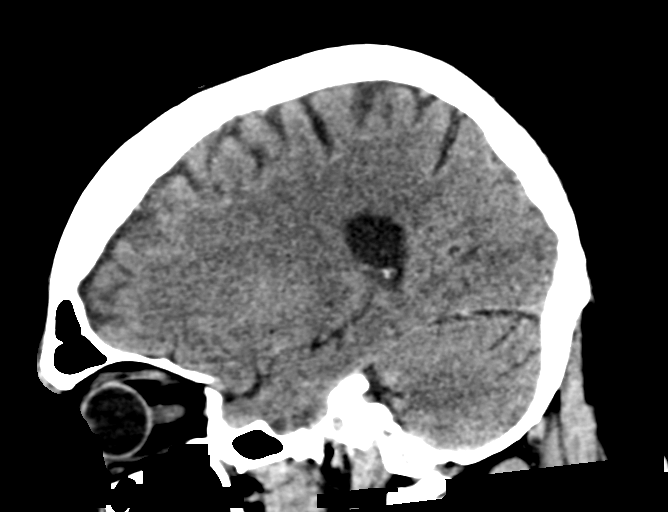

[15 of 47 positions shown; findings below may reference images not displayed]

FINDINGS: Brain: Cerebral volume is within normal limits for age. No midline
shift, ventriculomegaly, mass effect, evidence of mass lesion,
intracranial hemorrhage or evidence of cortically based acute
infarction. Gray-white matter differentiation appears symmetric and
within normal limits for age throughout the brain. Mild vascular
calcification left basal ganglia.

Vascular: Mild Calcified atherosclerosis at the skull base. No
suspicious intracranial vascular hyperdensity.

Skull: Congenital appearing incomplete ossification of the posterior
C1 ring. Mild plagiocephaly. No acute osseous abnormality
identified.

Sinuses/Orbits: Visualized paranasal sinuses and mastoids are clear.
Tympanic cavities are clear.

Other: Visualized orbits and scalp soft tissues are within normal
limits.

ASPECTS ([place] Stroke Program Early CT Score)

Total score (0-10 with 10 being normal): 10
IMPRESSION: 1. Normal for age non contrast CT appearance of the brain. ASPECTS
10.
2. These results were communicated to Dr. PAULUS N at [DATE] on
[DATE] by text page via the [employer] messaging system.

## 2021-11-25 MED ORDER — CLEVIDIPINE BUTYRATE 0.5 MG/ML IV EMUL
0.0000 mg/h | INTRAVENOUS | Status: DC
Start: 2021-11-25 — End: 2021-11-29
  Administered 2021-11-27: 6 mg/h via INTRAVENOUS
  Administered 2021-11-27: 10 mg/h via INTRAVENOUS
  Administered 2021-11-27: 6 mg/h via INTRAVENOUS
  Administered 2021-11-27: 4 mg/h via INTRAVENOUS
  Filled 2021-11-25 (×5): qty 50

## 2021-11-25 MED ORDER — PANTOPRAZOLE SODIUM 40 MG IV SOLR
40.0000 mg | Freq: Every day | INTRAVENOUS | Status: DC
  Administered 2021-11-25 – 2021-11-27 (×3): 40 mg via INTRAVENOUS
  Filled 2021-11-25 (×2): qty 40

## 2021-11-25 MED ORDER — ONDANSETRON HCL 4 MG/2ML IJ SOLN
4.0000 mg | Freq: Once | INTRAMUSCULAR | Status: AC
  Administered 2021-11-25: 4 mg via INTRAVENOUS
  Filled 2021-11-25: qty 2

## 2021-11-25 MED ORDER — DILTIAZEM LOAD VIA INFUSION
10.0000 mg | Freq: Once | INTRAVENOUS | Status: AC
  Administered 2021-11-25: 10 mg via INTRAVENOUS
  Filled 2021-11-25: qty 10

## 2021-11-25 MED ORDER — LORAZEPAM 2 MG/ML IJ SOLN
INTRAMUSCULAR | Status: AC
  Administered 2021-11-25: 0.5 mg via INTRAVENOUS
  Filled 2021-11-25: qty 1

## 2021-11-25 MED ORDER — LACTATED RINGERS IV BOLUS
500.0000 mL | Freq: Once | INTRAVENOUS | Status: AC
  Administered 2021-11-25: 500 mL via INTRAVENOUS

## 2021-11-25 MED ORDER — METOPROLOL TARTRATE 5 MG/5ML IV SOLN
5.0000 mg | INTRAVENOUS | Status: DC | PRN
  Administered 2021-11-28: 07:00:00 5 mg via INTRAVENOUS
  Filled 2021-11-25: qty 5

## 2021-11-25 MED ORDER — ACETAMINOPHEN 325 MG PO TABS
650.0000 mg | ORAL_TABLET | ORAL | Status: DC | PRN
  Administered 2021-11-26 – 2021-12-04 (×8): 650 mg via ORAL
  Filled 2021-11-25 (×8): qty 2

## 2021-11-25 MED ORDER — ACETAMINOPHEN 650 MG RE SUPP
650.0000 mg | RECTAL | Status: DC | PRN
  Administered 2021-11-26 – 2021-11-29 (×3): 650 mg via RECTAL
  Filled 2021-11-25 (×5): qty 1

## 2021-11-25 MED ORDER — DIAZEPAM 5 MG/ML IJ SOLN
2.5000 mg | Freq: Once | INTRAMUSCULAR | Status: AC
  Administered 2021-11-25: 2.5 mg via INTRAVENOUS
  Filled 2021-11-25: qty 2

## 2021-11-25 MED ORDER — TAMSULOSIN HCL 0.4 MG PO CAPS
0.4000 mg | ORAL_CAPSULE | Freq: Every day | ORAL | Status: DC
  Administered 2021-11-27 – 2021-11-28 (×2): 0.4 mg via ORAL
  Filled 2021-11-25 (×3): qty 1

## 2021-11-25 MED ORDER — SENNOSIDES-DOCUSATE SODIUM 8.6-50 MG PO TABS
2.0000 | ORAL_TABLET | Freq: Every day | ORAL | Status: DC
  Administered 2021-11-26 – 2021-11-28 (×3): 2 via ORAL
  Filled 2021-11-25 (×3): qty 2

## 2021-11-25 MED ORDER — ACETAMINOPHEN 160 MG/5ML PO SOLN
650.0000 mg | ORAL | Status: DC | PRN
  Administered 2021-11-30 – 2021-12-17 (×13): 650 mg
  Filled 2021-11-25 (×12): qty 20.3

## 2021-11-25 MED ORDER — LORAZEPAM 2 MG/ML IJ SOLN: 0.5000 mg | Freq: Once | INTRAMUSCULAR | Status: AC

## 2021-11-25 MED ORDER — CLEVIDIPINE BUTYRATE 0.5 MG/ML IV EMUL
INTRAVENOUS | Status: AC
  Administered 2021-11-25: 25 mg
  Filled 2021-11-25: qty 50

## 2021-11-25 MED ORDER — IOHEXOL 350 MG/ML SOLN
100.0000 mL | Freq: Once | INTRAVENOUS | Status: AC | PRN
  Administered 2021-11-25: 100 mL via INTRAVENOUS

## 2021-11-25 MED ORDER — DILTIAZEM HCL-DEXTROSE 125-5 MG/125ML-% IV SOLN (PREMIX)
5.0000 mg/h | INTRAVENOUS | Status: AC
Start: 2021-11-25 — End: 2021-11-26
  Administered 2021-11-25: 10 mg/h via INTRAVENOUS
  Administered 2021-11-25: 14:00:00 5 mg/h via INTRAVENOUS
  Filled 2021-11-25 (×2): qty 125

## 2021-11-25 MED ORDER — IOHEXOL 350 MG/ML SOLN
75.0000 mL | Freq: Once | INTRAVENOUS | Status: AC | PRN
  Administered 2021-11-25: 75 mL via INTRAVENOUS

## 2021-11-25 MED ORDER — SODIUM CHLORIDE 0.9 % IV SOLN: INTRAVENOUS | Status: DC

## 2021-11-25 MED ORDER — STROKE: EARLY STAGES OF RECOVERY BOOK
Freq: Once | Status: AC
  Filled 2021-11-25: qty 1

## 2021-11-25 MED ORDER — HYDRALAZINE HCL 20 MG/ML IJ SOLN
5.0000 mg | Freq: Once | INTRAMUSCULAR | Status: AC
  Administered 2021-11-25: 5 mg via INTRAVENOUS
  Filled 2021-11-25: qty 1

## 2021-11-25 MED ORDER — CLEVIDIPINE BUTYRATE 0.5 MG/ML IV EMUL: 0.0000 mg/h | INTRAVENOUS | Status: DC

## 2021-11-25 MED ORDER — TAMSULOSIN HCL 0.4 MG PO CAPS: 0.4000 mg | ORAL_CAPSULE | Freq: Every day | ORAL | Status: DC

## 2021-11-25 MED ORDER — TENECTEPLASE FOR STROKE
0.2500 mg/kg | PACK | Freq: Once | INTRAVENOUS | Status: AC
  Administered 2021-11-25: 25 mg via INTRAVENOUS
  Filled 2021-11-25: qty 10

## 2021-11-25 MED ORDER — SODIUM CHLORIDE 0.9% FLUSH
3.0000 mL | Freq: Once | INTRAVENOUS | Status: AC
  Administered 2021-11-25: 3 mL via INTRAVENOUS

## 2021-11-25 NOTE — Plan of Care (Signed)
  Problem: Clinical Measurements: Goal: Cardiovascular complication will be avoided Outcome: Progressing   Problem: Elimination: Goal: Will not experience complications related to urinary retention Outcome: Progressing   Problem: Safety: Goal: Ability to remain free from injury will improve Outcome: Progressing

## 2021-11-25 NOTE — Progress Notes (Signed)
1515: First bedside [facility] swallow screen performed by this RN. Patient coughing on intake of fluids, received "Failed" score.  1800: [facility] swallow screen reattempted. No coughing noted initially. Pt began profusely vomiting clear liquid within 15 seconds of PO intake. RN sat patient in High Fowler's position and helped support airway. Alcohol swab provided for patient to smell to help with nausea. Dr. [name] paged. Orders placed for one-time dose of 0.5 Ativan. Per Dr. [name] Zofran not considered due to prolonged QT interval with recent cardiac arrhythmias. Ativan administered with no further vomiting. Spoke with Dr. [name] again regarding diet status, will keep patient NPO until vomiting risk has decreased or SLP consult can be performed.

## 2021-11-25 NOTE — Progress Notes (Signed)
1437: Pt. Arrived to [facility]. Found to be in stool containing frank red blood. Bladder distended with overflow incontinence with movement. Attempted to allow patient to use urinal, patient unable to void independently. Spoke with Dr. [name] regarding stool and urinary retention. RN instructed to continue to monitor for worsening in bleeding as patient has prior history of internal hemorrhoids. For urinary retention, order placed for Foley catheter to avoid urethral trauma from repeated I&O catheterizations.

## 2021-11-25 NOTE — ED Triage Notes (Addendum)
NS notified to activate Code Stroke on pt at triage at 1019.  Pt states he woke up and felt normal this morning.  While putting his coat on at 10am he had sudden onset of dizziness, unsteady gait, nausea, and vomiting.  States his stomach feels very upset.  Denies history of vertigo.  Pt pale and diaphoretic.  EKG and labs completed at triage and pt to bridge for EDP assessment.

## 2021-11-25 NOTE — ED Provider Notes (Signed)
[facility]ERGENCY DEPARTMENT Provider Note   CSN: [id number] Arrival date & time: 11/25/21  1006  An emergency department physician performed an initial assessment on this suspected stroke patient at 1021.  History Chief Complaint  Patient presents with   Code Stroke    [name] is a 75 y.o. male.  HPI      75 year old male with a history of CABGx4, hyperlipidemia, prostate cancer who presents with concern for acute onset vertigo, nausea, vomiting, inability to walk.   Reports going to a wedding last night, had 2 glasses of wine. This morning he was in a normal state of health until 10 AM, when he suddenly developed room spinning dizziness, nausea and vomiting.  He reports developing the sudden onset dizziness, difficulty walking with associated nausea and vomiting.  He feels generalized abdominal discomfort, like he needs to have a bowel movement and his stomach is upset, but denies any other significant abdominal pain.  Denies diarrhea, constipation, black or bloody stools.  Denies numbness, weakness, visual changes, difficulty talking.  He has been out on a unable to walk since the dizziness started.  No history of ear symptoms, tinnitus, congestion, cough.  Denies any chest pain or shortness of breath.   A code stroke was called on arrival to triage.   Past Medical History:  Diagnosis Date   CAD (coronary artery disease)    a. s/p CABG in 2012   Hyperlipidemia     Patient Active Problem List   Diagnosis Date Noted   Stroke [facility]) 11/25/2021    Past Surgical History:  Procedure Laterality Date   CORONARY ARTERY BYPASS GRAFT  2012       Family History  Problem Relation Age of Onset   Cancer Brother    Coronary artery disease Brother     Social History   Tobacco Use   Smoking status: Former    Types: Cigarettes   Smokeless tobacco: Never  Substance Use Topics   Alcohol use: Yes    Alcohol/week: 3.0 standard drinks    Types: 3  Cans of beer per week   Drug use: Never    Home Medications Prior to Admission medications   Medication Sig Start Date End Date Taking? Authorizing Provider  aspirin EC 81 MG tablet Take 81 mg by mouth daily. Swallow whole.   Yes [provider]  atorvastatin (LIPITOR) 80 MG tablet Take 80 mg by mouth daily. 10/01/21  Yes [provider]  HYDROcodone-acetaminophen (NORCO/VICODIN) 5-325 MG tablet Take 1 tablet by mouth daily as needed for pain. 09/12/21  Yes [provider]  Omega-3 Fatty Acids (FISH OIL) 1000 MG CAPS Take 2,000 mg by mouth daily.   Yes [provider]  tamsulosin (FLOMAX) 0.4 MG CAPS capsule Take 0.4 mg by mouth daily.   Yes [provider]  tiZANidine (ZANAFLEX) 2 MG tablet Take 2 mg by mouth 3 (three) times daily.   Yes [provider]    Allergies    Patient has no allergy information on record.  Review of Systems   Review of Systems  Constitutional:  Negative for fever.  HENT:  Negative for sore throat.   Eyes:  Negative for visual disturbance.  Respiratory:  Negative for shortness of breath.   Cardiovascular:  Negative for chest pain.  Gastrointestinal:  Positive for abdominal pain, nausea and vomiting. Negative for constipation and diarrhea.  Genitourinary:  Negative for difficulty urinating.  Musculoskeletal:  Positive for gait problem. Negative for back  pain and neck stiffness.  Skin:  Negative for rash.  Neurological:  Positive for dizziness. Negative for syncope, speech difficulty, weakness, numbness and headaches.   Physical Exam Updated Vital Signs BP (!) 122/48 (BP Location: Left Arm)   Pulse 66   Temp 98.8 F (37.1 C) (Oral)   Resp (!) 22   Ht 5\' 10"  (1.778 m)   Wt 98.1 kg   SpO2 95%   BMI 31.03 kg/m   Physical Exam Vitals and nursing note reviewed.  Constitutional:      General: He is not in acute distress.    Appearance: He is well-developed. He is not diaphoretic.  HENT:     Head:  Normocephalic and atraumatic.  Eyes:     Conjunctiva/sclera: Conjunctivae normal.  Cardiovascular:     Rate and Rhythm: Normal rate and regular rhythm.     Heart sounds: Normal heart sounds. No murmur heard.   No friction rub. No gallop.  Pulmonary:     Effort: Pulmonary effort is normal. No respiratory distress.     Breath sounds: Normal breath sounds. No wheezing or rales.  Abdominal:     General: There is no distension.     Palpations: Abdomen is soft.     Tenderness: There is no abdominal tenderness. There is no guarding.  Musculoskeletal:     Cervical back: Normal range of motion.  Skin:    General: Skin is warm and dry.  Neurological:     Mental Status: He is alert and oriented to person, place, and time.    ED Results / Procedures / Treatments   Labs (all labs ordered are listed, but only abnormal results are displayed) Labs Reviewed  COMPREHENSIVE METABOLIC PANEL - Abnormal; Notable for the following components:      Result Value   Glucose, Bld 134 (*)    All other components within normal limits  URINALYSIS, ROUTINE W REFLEX MICROSCOPIC - Abnormal; Notable for the following components:   Specific Gravity, Urine 1.033 (*)    Glucose, UA 50 (*)    Hgb urine dipstick SMALL (*)    Ketones, ur 20 (*)    All other components within normal limits  I-STAT CHEM 8, ED - Abnormal; Notable for the following components:   Glucose, Bld 135 (*)    Calcium, Ion 1.00 (*)    All other components within normal limits  CBG MONITORING, ED - Abnormal; Notable for the following components:   Glucose-Capillary 132 (*)    All other components within normal limits  CBG MONITORING, ED - Abnormal; Notable for the following components:   Glucose-Capillary 141 (*)    All other components within normal limits  RESP PANEL BY RT-PCR (FLU A&B, COVID) ARPGX2  MRSA NEXT GEN BY PCR, NASAL  PROTIME-INR  APTT  CBC  DIFFERENTIAL  RAPID URINE DRUG SCREEN, HOSP PERFORMED  HEMOGLOBIN A1C  LIPID  PANEL  TSH  CBC  BASIC METABOLIC PANEL  TYPE AND SCREEN  ABO/RH    EKG EKG Interpretation  Date/Time:  Sunday November 25 2021 10:47:17 EST Ventricular Rate:  64 PR Interval:  109 QRS Duration: 100 QT Interval:  437 QTC Calculation: 451 R Axis:   58 Text Interpretation: Sinus or ectopic atrial rhythm Short PR interval Minimal ST depression, diffuse leads No previous ECGs available Confirmed by Gareth Morgan [phone]) on 11/25/2021 11:34:11 AM  Radiology MR BRAIN WO CONTRAST  Result Date: 11/25/2021 CLINICAL DATA:  Stroke. EXAM: MRI HEAD WITHOUT CONTRAST TECHNIQUE: Multiplanar, multiecho pulse sequences  of the brain and surrounding structures were obtained without intravenous contrast. COMPARISON:  CT head 11/25/2021 FINDINGS: Brain: Acute infarct right superior cerebellum. Acute infarct left posterior lateral cerebellum. No associated hemorrhage. Ventricle size normal for age. Negative for hemorrhage or mass. No significant chronic ischemic change. Vascular: Normal arterial flow voids at the skull base. Skull and upper cervical spine: Negative Sinuses/Orbits: Minimal mucosal edema paranasal sinuses. Negative orbit Other: None IMPRESSION: Acute infarct in the cerebellum bilaterally. Negative for hemorrhage. Electronically Signed   By: Franchot Gallo M.D.   On: 11/25/2021 11:53   CT HEAD CODE STROKE WO CONTRAST  Result Date: 11/25/2021 CLINICAL DATA:  Code stroke. 75 year old male with dizziness and abnormal gait. EXAM: CT HEAD WITHOUT CONTRAST TECHNIQUE: Contiguous axial images were obtained from the base of the skull through the vertex without intravenous contrast. COMPARISON:  None. FINDINGS: Brain: Cerebral volume is within normal limits for age. No midline shift, ventriculomegaly, mass effect, evidence of mass lesion, intracranial hemorrhage or evidence of cortically based acute infarction. Gray-white matter differentiation appears symmetric and within normal limits for age  throughout the brain. Mild vascular calcification left basal ganglia. Vascular: Mild Calcified atherosclerosis at the skull base. No suspicious intracranial vascular hyperdensity. 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 Mild plagiocephaly. No acute osseous abnormality identified. Sinuses/Orbits: Visualized paranasal sinuses and mastoids are clear. Tympanic cavities are clear. Other: Visualized orbits and scalp soft tissues are within normal limits. ASPECTS [facility] Stroke Program Early CT Score) Total score (0-10 with 10 being normal): 10 IMPRESSION: 1. Normal for age non contrast CT appearance of the brain. ASPECTS 10. 2. These results were communicated to Dr. [name] at 10:41 am on 11/25/2021 by text page via the [facility] messaging system. Electronically Signed   By: Genevie Ann M.D.   On: 11/25/2021 10:41   CT ANGIO HEAD NECK W WO CM (CODE STROKE)  Result Date: 11/25/2021 CLINICAL DATA:  Acute neuro deficit. Cerebellar infarct bilaterally on MRI today. EXAM: CT ANGIOGRAPHY HEAD AND NECK TECHNIQUE: Multidetector CT imaging of the head and neck was performed using the standard protocol during bolus administration of intravenous contrast. Multiplanar CT image reconstructions and MIPs were obtained to evaluate the vascular anatomy. Carotid stenosis measurements (when applicable) are obtained utilizing NASCET criteria, using the distal internal carotid diameter as the denominator. CONTRAST:  73mL OMNIPAQUE IOHEXOL 350 MG/ML SOLN COMPARISON:  MRI head 11/26/2019.  CT head 11/25/2021 FINDINGS: CTA NECK FINDINGS Aortic arch: Suboptimal arterial opacification due to scan timing. Minimal atherosclerotic calcification aortic arch. Proximal great vessels widely patent. Right carotid system: Mild atherosclerotic calcification right carotid bifurcation without significant stenosis. Left carotid system: Mild atherosclerotic calcification left carotid bifurcation without significant  stenosis. Vertebral arteries: Both vertebral arteries widely patent without significant stenosis. Skeleton: Mild degenerative change cervical spine. No acute skeletal abnormality. Other neck: Mild degenerative change cervical spine. No acute skeletal abnormality. Upper chest: Lung apices clear bilaterally. Review of the MIP images confirms the above findings CTA HEAD FINDINGS Anterior circulation: Internal carotid artery widely patent through the skull base and cavernous segments. Anterior and middle cerebral arteries widely patent and normal bilaterally. Posterior circulation: Suboptimal arterial opacification due to scan timing. Both vertebral arteries patent to the basilar. PICA patent bilaterally. Basilar widely patent without significant stenosis or thrombus. Mild atherosclerotic irregularity. Superior cerebellar arteries patent bilaterally. Proximal left AICA patent. Right AICA not visualized. Venous sinuses: Normal venous enhancement. Anatomic variants: None Review of the MIP images confirms the above findings IMPRESSION: 1. Negative  for intracranial large vessel occlusion. No significant basilar stenosis or thrombus. 2. Suboptimal arterial opacification due to scan timing. 3. Mild atherosclerotic disease in the carotid bifurcation bilaterally. Negative for carotid or vertebral artery stenosis. Electronically Signed   By: Franchot Gallo M.D.   On: 11/25/2021 13:16   CT Angio Abd/Pel W and/or Wo Contrast  Result Date: 11/25/2021 CLINICAL DATA:  GI bleed EXAM: CTA ABDOMEN AND PELVIS WITHOUT AND WITH CONTRAST TECHNIQUE: Multidetector CT imaging of the abdomen and pelvis was performed using the standard protocol during bolus administration of intravenous contrast. Multiplanar reconstructed images and MIPs were obtained and reviewed to evaluate the vascular anatomy. CONTRAST:  150mL OMNIPAQUE IOHEXOL 350 MG/ML SOLN COMPARISON:  None. FINDINGS: VASCULAR Aorta: Normal caliber aorta without aneurysm, dissection,  vasculitis or significant stenosis. Moderate atherosclerotic disease of the abdominal aorta consisting of calcified and noncalcified plaque. Celiac: Patent without evidence of aneurysm, dissection, vasculitis or significant stenosis. SMA: Patent without evidence of aneurysm, dissection, vasculitis or significant stenosis. Renals: Both renal arteries are patent without evidence of aneurysm, dissection, vasculitis, fibromuscular dysplasia or significant stenosis. IMA: Patent without evidence of aneurysm, dissection, vasculitis or significant stenosis. Inflow: Patent without evidence of aneurysm, dissection, vasculitis or significant stenosis. Mild scattered calcified and noncalcified plaque. Proximal Outflow: Bilateral common femoral and visualized portions of the superficial and profunda femoral arteries are patent without evidence of aneurysm, dissection, vasculitis or significant stenosis. Veins: No obvious venous abnormality within the limitations of this arterial phase study. Review of the MIP images confirms the above findings. NON-VASCULAR Lower chest: Right greater than left bibasilar opacities, likely due to scarring or atelectasis. Coronary artery calcifications. Hepatobiliary: Low-attenuation lesions of the left lobe of the liver which are likely simple cysts. No suspicious hepatic lesions. Gallbladder is unremarkable. No biliary ductal dilation. Pancreas: Unremarkable. No pancreatic ductal dilatation or surrounding inflammatory changes. Spleen: Normal in size without focal abnormality. Adrenals/Urinary Tract: Bilateral adrenal glands are unremarkable. Kidneys enhance symmetrically with no evidence of hydronephrosis or nephrolithiasis. No suspicious filling defects of the opacified renal collecting systems. Bladder is unremarkable. Stomach/Bowel: Small hiatal hernia. Stomach is otherwise unremarkable. Diverticulosis. Normal appearance of the gallbladder. No bowel wall thickening, inflammatory change or  evidence of obstruction. Arterial and venous phase images demonstrate no evidence of active GI bleed. Lymphatic: No significant vascular findings are present. No enlarged abdominal or pelvic lymph nodes. Reproductive: Brachytherapy seeds noted in the prostate. Other: Small fat containing left inguinal hernia. Musculoskeletal: Prior left total hip arthroplasty. No aggressive appearing osseous lesions. IMPRESSION: VASCULAR 1. No evidence of active GI bleed. 2. Moderate aortic Atherosclerosis (ICD10-I70.0). NON-VASCULAR 1. Diverticulosis with no evidence of diverticulitis. Electronically Signed   By: Yetta Glassman M.D.   On: 11/25/2021 14:36    Procedures .Critical Care Performed by: Gareth Morgan, MD Authorized by: Gareth Morgan, MD   Critical care provider statement:    Critical care time (minutes):  75   Critical care was time spent personally by me on the following activities:  Development of treatment plan with patient or surrogate, discussions with consultants, evaluation of patient's response to treatment, examination of patient, ordering and review of laboratory studies, ordering and review of radiographic studies, ordering and performing treatments and interventions, pulse oximetry, re-evaluation of patient's condition and review of old charts   Medications Ordered in ED Medications  acetaminophen (TYLENOL) tablet 650 mg (has no administration in time range)    Or  acetaminophen (TYLENOL) 160 MG/5ML solution 650 mg (has no administration in time range)  Or  acetaminophen (TYLENOL) suppository 650 mg (has no administration in time range)  pantoprazole (PROTONIX) injection 40 mg (40 mg Intravenous Given 11/25/21 2114)  0.9 %  sodium chloride infusion ( Intravenous Infusion Verify 11/25/21 2000)  senna-docusate (Senokot-S) tablet 2 tablet (2 tablets Oral Not Given 11/25/21 2114)  diltiazem (CARDIZEM) 1 mg/mL load via infusion 10 mg (10 mg Intravenous Bolus from Bag 11/25/21 1354)     And  diltiazem (CARDIZEM) 125 mg in dextrose 5% 125 mL (1 mg/mL) infusion (10 mg/hr Intravenous New Bag/Given 11/25/21 2114)  tamsulosin (FLOMAX) capsule 0.4 mg (0.4 mg Oral Not Given 11/25/21 1832)  metoprolol tartrate (LOPRESSOR) injection 5 mg (has no administration in time range)  clevidipine (CLEVIPREX) infusion 0.5 mg/mL (0 mg/hr Intravenous Not Given 11/25/21 1831)  sodium chloride flush (NS) 0.9 % injection 3 mL (3 mLs Intravenous Given 11/25/21 1055)  lactated ringers bolus 500 mL (0 mLs Intravenous Stopped 11/25/21 1302)  diazepam (VALIUM) injection 2.5 mg (2.5 mg Intravenous Given 11/25/21 1057)  ondansetron (ZOFRAN) injection 4 mg (4 mg Intravenous Given 11/25/21 1056)  hydrALAZINE (APRESOLINE) injection 5 mg (5 mg Intravenous Given 11/25/21 1102)  tenecteplase (TNKASE) injection for Stroke 25 mg (25 mg Intravenous Given 11/25/21 1214)  iohexol (OMNIPAQUE) 350 MG/ML injection 75 mL (75 mLs Intravenous Contrast Given 11/25/21 1234)   stroke: mapping our early stages of recovery book ( Does not apply Given 11/25/21 1836)  iohexol (OMNIPAQUE) 350 MG/ML injection 100 mL (100 mLs Intravenous Contrast Given 11/25/21 1356)  LORazepam (ATIVAN) injection 0.5 mg (0.5 mg Intravenous Given 11/25/21 1828)    ED Course  I have reviewed the triage vital signs and the nursing notes.  Pertinent labs & imaging results that were available during my care of the patient were reviewed by me and considered in my medical decision making (see chart for details).    MDM Rules/Calculators/A&P                            75 year old male with a history of CABGx4, hyperlipidemia, prostate cancer who presents with concern for acute onset vertigo, nausea, vomiting, inability to walk.   CODE STROKE called on arrival and evaluated by Neurology Dr. [name], low NIH, CT without acute findings. Recommends MRI.   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    Labs without acute findings. Going emergently to MRI which shows signs of cerebellar CVA. Dr. [name] to bedside for reevaluation and CTA ordered.  Reexamined and given known CVA, defictis in tnkase window he was given tNKase for posterior CVA> No obstruction on CTA.    After receiving it, he had bleeding. Noted to have bright red blood in underwear, had brown stool that had blood on outside. Not passing clots, hematochezia or melena. Sent type and screen. Suspect bleeding form internal hemorrhoids (he has had this in the past) --however will order CTA to evaluate for other significant lower GI bleed or etiology of mild pain on repeat exam.  He became tachycardic to the 150s, EKG shows atrial flutter, on monitor later noted to look more like atrial fibrillation. Cardiology consulted. Cleviprex with bp to 160s on very litte, will discontinue and start diltiazem bolus and gtt. For atrial fibrillation/flutter.   Final Clinical Impression(s) / ED Diagnoses Final diagnoses:  Acute ischemic stroke ([place])  Cerebellar stroke ([place])  Atrial flutter, unspecified type ([place])  Rectal bleeding    Rx /  DC Orders ED Discharge Orders     None        Gareth Morgan, MD 11/25/21 2202

## 2021-11-25 NOTE — ED Notes (Signed)
Cardiology at bedside.

## 2021-11-25 NOTE — H&P (Signed)
Neurology H&P  CC: dizziness, ataxia, n/v.   History is obtained from: patient, chart, wife.   HPI: [name] is a 75 y.o. male with a PMHx of CAD s/p CABG x4, HLD, OA, nephrolithiasis, urinary retention, BPH with LUTS and prostate cancer who presented by wife. Wife states they were at a hotel (visiting from [place], [place]) this am and husband was in his normal state of health when he awoke at 0500 hours. They went to parking lot at 1000 hours to move cars. Wife then noted the patient to be leaning over against the car and he stated he could not move. The family present are MDs and evaluated patient and told patient to go to hospital.   Upon arrival, he was ataxic with gait, had n/v several times, and was experiencing vertigo. He also c/o left abdominal pain and knows he has a bladder stone with cystoscopy earlier this month. No hematuria since procedure. No history of vertigo or vestibular problems. Code stroke was called by ED MD.   Upon arrival in CT suite, the patient had difficulty standing from wheelchair and rotating to bed. He needed assistance x 2. He had pallor and was diaphoretic. He was c/o left lower abdominal pain and vomited several times while in CT suite. CTH showed no acute finding and his NIHSS was 0. TNK was not offered at that time. Patient went stat to MRI brain which was positive for findings consistent with bilateral cerebellar strokes.   Upon re-examining, he was noted to have developed ataxia in BL uppers and significantly in BL lower extremities. Given patient was still in TNK window, we returned to ED room and Dr. [name] had a discussion with patient and wife about TNK and risk of bleeding. Patient has a history of hematuria with history of passing clots in his urine along with history of blood spots in his stool. Patient's wife understood his increased risk of GI and GU bleeding in addition to the risk of ICH from [facility]. Felt that his stroke would be debilitating and this  wife hesitantly agree to tNKASE. Patient's BP was over 200 after Hydralazine 5mg  IV. Hydralazine 15mg  IV was then given. BP did not decrease to TNK parameters, so Cleviprex was started. When BP in parameters, TNK was immediately given at 1214 and patient tolerated well. His Cleviprex was increased and his BP remained below parameters with TNK.   After TNK administration, patient returned urgently to CT suite for CTA head and neck which did not show LVO. Patient was taken back to ED and will be admitted to ICU overnight secondary to TNK administration.   After TNK, RN informed NP that patient had some blood streaked stool. NP to room immediately. Stool was brown and solid. Only streaks of blood. No hematochezia, hematemesis. Patient states he has internal hemorrhoids and this same thing occurs at home from time to time. He has never been to a hospital for GI bleeding.    In Care Everywhere, patient has CT abd/pelvis with and without contrast on 11/15/21 due to gross hematuria. He had a urogram which showed a 74mm nonobstructive calculus within the right interpole. Patient denies any hematuria since procedure.   Patient was in NSR, but went into Aflutter with RVR at 1315 and cardiology consult was called by ED MD.   LKW: 1000 TNK given? Yes, after MRI brain resulted.  IR thrombectomy? No , no LVO.  MRS: 0  NIHSS: 0 in CT suite. On subsequent exam in ED  room after MRI brain resulted, patient was noted to be ataxic in the right UE/LE with FNF and HKS.  NIHSS score up to 2.   ROS: As per HPI. A robust ROS was unable to be performed due to emergent nature of event.   PMHx: CAD, HLD, prostate cancer.  SurgHx: CABG x 4, right TKR, XRT and brachytherapy for prostate cancer, TURBT, cystourethroscopy.   No family history on file.  Social History:  has no history on file for tobacco use, alcohol use, and drug use.   PTA medications: ASA 81mg , Lipitor, Hydrocodone, Flomax, Zanaflex.   Not on File    Exam: Current vital signs: BP (!) 170/64   Pulse (!) 114   Temp 98.6 F (37 C) (Oral)   Resp (!) 21   Wt 98.1 kg   SpO2 97%   Physical Exam  Constitutional: Acutely ill appearing, but not toxic male in NAD except for vomiting. WDWN.    Psych: Affect appropriate to situation. Eyes: No scleral injection. HENT: No OP obstruction. Head: Normocephalic/Atraumatic.   Cardiovascular: Normal rate and regular rhythm on telemetry.  No LE edema.  Respiratory: Effort normal.  GI: Soft.  No distension. There is no tenderness.  Skin: pallor, diaphoretic.   Neuro: Mental Status: Patient is awake, alert, oriented to person, place, month, year, and situation. Patient is able to give a clear and coherent history. No signs of aphasia or neglect. Cranial Nerves: II: Visual Fields are full. Pupils are equal, round, and reactive to light. Blinks to threat.  III,IV, VI: EOMI without ptosis or diplopia.  V: Facial sensation is symmetric to light touch.  VII: Facial movement is symmetric. No droop.  VIII: hearing is intact to voice. X: Uvula is midline and palate elevates symmetrically. XI: Shoulder shrug is symmetric. XII: tongue is midline without atrophy or fasciculations.  Motor: 5/5 throughout.         Tone is normal. Bulk is normal.  Sensory: Sensation is symmetric to light touch in the UE/LEs. Extinction is absent to DSS.  Deep Tendon Reflexes: 2+ and symmetric in the biceps and patellae.  Plantars: Toes are downgoing bilaterally. Cerebellar: FNF and HKS are intact bilaterally in CT suite originally. Later, patient ataxic with right arm and leg. No drift.  Gait:  Deferred.   I have reviewed labs in epic and the pertinent results are: INR  1   aPTT  26     creat  .90   glucose  134     Images:  CTH Normal for age non contrast CT appearance of the brain. ASPECTS 10.  MRI brain Acute infarct in the cerebellum bilaterally. Negative for hemorrhage.  CTA head and neck No LVO  per MD and discussed with radiologist.  Assessment: 75 yo male with stroke risk factors of CAD and HLD. CTH was negative for acute findings with ASPECTS or 10. In the CT suite, his NIHSS was 0 and TNK was not offered. However, after stat MRI showed bilateral cerebellar infarcts and upon reexamining, he was noted to have developed ataxia. Patient was given TNK as he was still in the window. On repeat exam, prior to TNK, his NIHSS was 2 for right sided ataxia. He has no known history of cardiac arrhythmia, but given his change to Aflutter with RVR at 1315, he could have had silent AF without past diagnosis. If he is confirmed AF, this could have contributed to the stroke. His presenting symptoms are consistent with the location of the strokes.  Impression:  -Bilateral cerebellar infarcts s/p TNK administration.   Plan:  -ICU admit per neurology.  -Close NIHSS and neurology check monitoring.  -Bleeding precautions.  -No non compressive sticks or tube insertion x 24 hours.  -No ASA or other AC x 24 hours.  -DAPT per stroke team in a.m.  -PT/OT/ST.  -echocardiogram. -Lipid panel, TSH, HbA1c.  -Goal LDL < 70-already on Lipitor 80mg .  -Goal A1c < 7.  -NPO until passes RN swallow study.  -Heart healthy diet after swallow study.  -Stroke team to follow in am.   Cards: New AF with RVR-per cardiology.  No AC til 24 hours after TNK administration.  SCDs.    GI/GU -hx internal hemorrhoids.  -monitor for GIB.  -monitor for hematuria given history of bladder stone and prostate cancer.   Prophylaxis: DVT: SCDs GI: Protonix 40mg  IV q 24 hours.  Bowel: Senna S ii po qhs, hold for loose stools.   Dispo: to be determined.  FULL CODE.   This patient is critically ill and at significant risk of neurological worsening, death and care requires constant monitoring of vital signs, hemodynamics,respiratory and cardiac monitoring, neurological assessment, discussion with family, other specialists and  medical decision making of high complexity. I spent 90 minutes of neurocritical care time  in the care of  this patient. This was time spent independent of any time provided by nurse practitioner or PA.  Patient seen by Clance Boll, MSN, APN-BC and MD. Note/plan to be edited by MD.     NEUROHOSPITALIST ADDENDUM Performed a face to face diagnostic evaluation.   I have reviewed the contents of history and physical exam as documented by PA/ARNP/Resident and agree with above documentation.  I have discussed and formulated the above plan as documented. Edits to the note have been made as needed.  Impression/ey exam findings/Plan: Stroke code for ataxia, nausea + vomiting and abdominal pain. NIHSS in the CT Scanner was 0 and tNKAse not offered. MRI Brain with developing stroke in BL cerebellum with DWI/FLAIR mismatch. On re-examination, noted to have BL lower extremities ataxia along with nausea + vomiting. Discussed risk of ICH with tNKase along with higher risk of bleeding given his prior history of hematuria and GI bleeding from hemorrhoids. He has only had spotting of his stools in the past from these hemorrhoids and this has never been a big concern. Wife is hesitant but given the debilitating nature of ataxia and the fact that he has strokes in BL cerebellum, agreed to [facility]. His Blood pressure was lowered with hydralazine and cleviprex and he was given tNKASE. CT Angio head and neck with no basilar thrombosis, no LVO. Will admit him to ICU and monitor closely along with type and cross.  [place] Pager Number [phone]  Donnetta Simpers, MD [facility] [phone]   If 7pm to 7am, please call on call as listed on AMION.

## 2021-11-25 NOTE — ED Notes (Deleted)
Finishing his lunch. Can he have 10 min before contacting? He has looked pt up

## 2021-11-25 NOTE — ED Notes (Signed)
Patient had episode of brown stool with bright red blood around it. EDP and neurology notified and at bedside monitoring patient. Bedside cleaning done and no more bleeding noted at this time.

## 2021-11-25 NOTE — ED Provider Notes (Addendum)
Emergency Medicine Provider Triage Evaluation Note  [name] , a 75 y.o. male  was evaluated in triage.  Pt complains of severe vertigo.  He states that he was last known normal at 1000.  He states that he woke up with severe vertigo this morning.  He is having nausea and vomiting.  States that its stomach also feels "very upset."  He also has systolic hypertension of 616 on arrival.  Review of Systems  Positive: See above Negative:   Physical Exam  BP (!) 226/87 (BP Location: Left Arm)   Pulse 84   Temp 98.6 F (37 C) (Oral)   Resp 20   SpO2 100%  Gen:   Awake, in moderate distress on exam Resp:  Normal effort  MSK:   Moves extremities without difficulty  Other:  No cranial nerve deficits.  No pronator drift.  He has strength 5/5 in bilateral upper and lower extremities.  He does have what appears to be vertical nystagmus when attempting to do extremes of gaze.  Complaining of vertigo and heaving during exam.  Medical Decision Making  Medically screening exam initiated at 10:21 AM.  Appropriate orders placed.  [name] was informed that the remainder of the evaluation will be completed by another provider, this initial triage assessment does not replace that evaluation, and the importance of remaining in the ED until their evaluation is complete.  Code stroke initiated.  Concern for [facility]   Mickie Hillier, PA-C 11/25/21 1022    Mickie Hillier, PA-C 11/25/21 1028    Mickie Hillier, PA-C 11/25/21 1110    Gareth Morgan, MD 11/26/21 Benancio Deeds

## 2021-11-25 NOTE — ED Notes (Signed)
Stroke swallow screen not completed yet due to patient intermittently vomiting.

## 2021-11-25 NOTE — Progress Notes (Addendum)
PHARMACIST CODE STROKE RESPONSE  Notified to mix TNK at 1204 by Dr. [name] Delivered TNK to RN at 1212  TNK dose = 25 mg IV over 5 seconds.   Issues/delays encountered (if applicable): Needed to obtain weight after consent. BP control requiring hydralazine and cleviprex infusion   Albertina Parr, PharmD., BCPS, BCCCP Clinical Pharmacist Please refer to [facility] for unit-specific pharmacist

## 2021-11-25 NOTE — Consult Note (Signed)
Cardiology Consultation:   Patient ID: [name] MRN: [id number]; DOB: [birth year]  Admit date: 11/25/2021 Date of Consult: 11/25/2021  PCP:  Rogelia Rohrer, MD   [facility] Providers Cardiologist:  [facility] (last visit in 2016 by review of records)  Patient Profile:   [name] is a 75 y.o. male with past medical history of CAD (s/p CABG in 2012 in [place], [place]), HLD and history of prostate cancer who is being seen 11/25/2021 for the evaluation of atrial flutter with RVR at the request of Dr. [name].  History of Present Illness:   [name] presented to [name] ED on 11/25/2021 for evaluation of new onset dizziness, nausea and vomiting. Was noted to have vertical nystagmus upon arrival and CODE STROKE was initiated. CT Head showed no acute findings. MRI showed acute infarct in the cerebellum bilaterally. He did receive TPA and has been started on Clevidipine given his elevated BP. Around 1315, he was noted to go from normal sinus rhythm to atrial flutter with RVR, heart rate in the 140's to 150's. He has been started on IV Cardizem with rates still variable in the 120's to 140's at times.  Most history is provided by his wife who is at the bedside as he is alert and oriented but reports feeling very uncomfortable and having a headache. She reports they live near [facility] and are in town for a wedding but last night he became pale and diaphoretic while sitting at the dinner table. Symptoms spontaneously resolved at that time and he was active throughout the night and dancing per his wife's report without any symptoms. This morning when they were preparing to leave the hotel, his wife noted he was leaning against the car and he told her he could not move. Two of their family members are pediatricians and evaluated the patient and recommended ED evaluation. He does have a known history of CAD as outlined above but is unaware of any prior history of CHF or cardiac  arrhythmias. He has recently been followed by Urology for hematuria and underwent cystoscopy last week which showed small stone debris which were aspirated.   Past Medical History:  Diagnosis Date   CAD (coronary artery disease)    a. s/p CABG in 2012   Hyperlipidemia     Past Surgical History:  Procedure Laterality Date   CORONARY ARTERY BYPASS GRAFT  2012     Home Medications:  Prior to Admission medications   Medication Sig Start Date End Date Taking? Authorizing Provider  aspirin EC 81 MG tablet Take 81 mg by mouth daily. Swallow whole.   Yes [provider]  atorvastatin (LIPITOR) 80 MG tablet Take 80 mg by mouth daily. 10/01/21  Yes [provider]  HYDROcodone-acetaminophen (NORCO/VICODIN) 5-325 MG tablet Take 1 tablet by mouth daily as needed for pain. 09/12/21  Yes [provider]  Omega-3 Fatty Acids (FISH OIL) 1000 MG CAPS Take 2,000 mg by mouth daily.   Yes [provider]  tamsulosin (FLOMAX) 0.4 MG CAPS capsule Take 0.4 mg by mouth daily.   Yes [provider]  tiZANidine (ZANAFLEX) 2 MG tablet Take 2 mg by mouth 3 (three) times daily.   Yes [provider]    Inpatient Medications: Scheduled Meds:   stroke: mapping our early stages of recovery book   Does not apply Once   pantoprazole (PROTONIX) IV  40 mg Intravenous QHS   senna-docusate  2 tablet Oral QHS   tamsulosin  0.4 mg  Oral QPC supper   Continuous Infusions:  sodium chloride     diltiazem (CARDIZEM) infusion 5 mg/hr (11/25/21 1353)   PRN Meds: acetaminophen **OR** acetaminophen (TYLENOL) oral liquid 160 mg/5 mL **OR** acetaminophen  Allergies:   Not on File  Social History:   Social History   Socioeconomic History   Marital status: Married    Spouse name: Not on file   Number of children: Not on file   Years of education: Not on file   Highest education level: Not on file  Occupational History   Not on file  Tobacco Use   Smoking status:  Former    Types: Cigarettes   Smokeless tobacco: Never  Substance and Sexual Activity   Alcohol use: Yes    Alcohol/week: 3.0 standard drinks    Types: 3 Cans of beer per week   Drug use: Never   Sexual activity: Not on file  Other Topics Concern   Not on file  Social History Narrative   Not on file   Social Determinants of Health   Financial Resource Strain: Not on file  Food Insecurity: Not on file  Transportation Needs: Not on file  Physical Activity: Not on file  Stress: Not on file  Social Connections: Not on file  Intimate Partner Violence: Not on file    Family History:   Family History  Problem Relation Age of Onset   Cancer Brother    Coronary artery disease Brother      ROS:  Please see the history of present illness.   All other ROS reviewed and negative.     Physical Exam/Data:   Vitals:   11/25/21 1403 11/25/21 1405 11/25/21 1410 11/25/21 1415  BP: (!) 163/69 (!) 164/67 (!) 166/65 (!) 143/76  Pulse: (!) 143 (!) 120 (!) 115 (!) 133  Resp: (!) 26 (!) 24 (!) 22 (!) 23  Temp:      TempSrc:      SpO2: 96% 96% 96% 96%  Weight:      Height:       No intake or output data in the 24 hours ending 11/25/21 1436 Last 3 Weights 11/25/2021  Weight (lbs) 216 lb 4.3 oz  Weight (kg) 98.1 kg     Body mass index is 31.03 kg/m.  General: Elderly male appearing uncomfortable on stretcher.  HEENT: normal Neck: no JVD Vascular: No carotid bruits; Distal pulses 2+ bilaterally Cardiac:  normal S1, S2; Irregularly irregular.  Lungs:  clear to auscultation bilaterally, no wheezing, rhonchi or rales  Abd: soft, nontender, no hepatomegaly  Ext: no pitting edema Musculoskeletal:  No deformities, BUE and BLE strength normal and equal Skin: warm and dry  Neuro:  no focal abnormalities noted Psych:  Normal affect   EKG:  The EKG was personally reviewed and demonstrates: Atrial flutter with RVR, HR 149.  Telemetry:  Telemetry was personally reviewed and demonstrates:  Atrial flutter, HR in 120's to 140's.   Relevant CV Studies:  Echocardiogram: Pending  Laboratory Data:  High Sensitivity Troponin:  No results for input(s): TROPONINIHS in the last 720 hours.   Chemistry Recent Labs  Lab 11/25/21 1020 11/25/21 1029  NA 139 140  K 3.8 3.6  CL 105 108  CO2 22  --   GLUCOSE 134* 135*  BUN 18 21  CREATININE 1.02 0.90  CALCIUM 9.1  --   GFRNONAA >60  --   ANIONGAP 12  --     Recent Labs  Lab 11/25/21 1020  PROT  6.9  ALBUMIN 3.9  AST 31  ALT 29  ALKPHOS 86  BILITOT 1.2   Lipids No results for input(s): CHOL, TRIG, HDL, LABVLDL, LDLCALC, CHOLHDL in the last 168 hours.  Hematology Recent Labs  Lab 11/25/21 1020 11/25/21 1029  WBC 6.8  --   RBC 5.37  --   HGB 16.6 16.7  HCT 48.7 49.0  MCV 90.7  --   MCH 30.9  --   MCHC 34.1  --   RDW 14.3  --   PLT 223  --    Thyroid No results for input(s): TSH, FREET4 in the last 168 hours.  BNPNo results for input(s): BNP, PROBNP in the last 168 hours.  DDimer No results for input(s): DDIMER in the last 168 hours.   Radiology/Studies:  MR BRAIN WO CONTRAST  Result Date: 11/25/2021 CLINICAL DATA:  Stroke. EXAM: MRI HEAD WITHOUT CONTRAST TECHNIQUE: Multiplanar, multiecho pulse sequences of the brain and surrounding structures were obtained without intravenous contrast. COMPARISON:  CT head 11/25/2021 FINDINGS: Brain: Acute infarct right superior cerebellum. Acute infarct left posterior lateral cerebellum. No associated hemorrhage. Ventricle size normal for age. Negative for hemorrhage or mass. No significant chronic ischemic change. Vascular: Normal arterial flow voids at the skull base. Skull and upper cervical spine: Negative Sinuses/Orbits: Minimal mucosal edema paranasal sinuses. Negative orbit Other: None IMPRESSION: Acute infarct in the cerebellum bilaterally. Negative for hemorrhage. Electronically Signed   By: Franchot Gallo M.D.   On: 11/25/2021 11:53   CT HEAD CODE STROKE WO  CONTRAST  Result Date: 11/25/2021 CLINICAL DATA:  Code stroke. 75 year old male with dizziness and abnormal gait. EXAM: CT HEAD WITHOUT CONTRAST TECHNIQUE: Contiguous axial images were obtained from the base of the skull through the vertex without intravenous contrast. COMPARISON:  None. FINDINGS: Brain: Cerebral volume is within normal limits for age. No midline shift, ventriculomegaly, mass effect, evidence of mass lesion, intracranial hemorrhage or evidence of cortically based acute infarction. Gray-white matter differentiation appears symmetric and within normal limits for age throughout the brain. Mild vascular calcification left basal ganglia. Vascular: Mild Calcified atherosclerosis at the skull base. No suspicious intracranial vascular hyperdensity. 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 Mild plagiocephaly. No acute osseous abnormality identified. Sinuses/Orbits: Visualized paranasal sinuses and mastoids are clear. Tympanic cavities are clear. Other: Visualized orbits and scalp soft tissues are within normal limits. ASPECTS [facility] Stroke Program Early CT Score) Total score (0-10 with 10 being normal): 10 IMPRESSION: 1. Normal for age non contrast CT appearance of the brain. ASPECTS 10. 2. These results were communicated to Dr. [name] at 10:41 am on 11/25/2021 by text page via the [facility] messaging system. Electronically Signed   By: Genevie Ann M.D.   On: 11/25/2021 10:41     Assessment and Plan:   1. Atrial Flutter with RVR - This is a new diagnosis for the patient this admission and he did have an episode of dizziness yesterday evening which could have been possible atrial flutter but he did not tell any family members of associated symptoms at that time. Was initially in normal sinus rhythm upon arrival to the ED but went to atrial flutter with RVR around 1315. - Electrolytes WNL. Will check TSH. Echocardiogram pending. Currently on IV Cardizem 5 mg/hr with  rates mostly in the 120's to 130's. Can titrate if BP remains elevated but anticipate Neurology will want to allow for permissive hypertension in the setting of his recent CVA. Initially plan for a rate-control strategy as  he is not a candidate for DCCV or antiarrhythmics at this time.  - He will require initiation of anticoagulation and will defer timing of this to Neurology given that he did receive tPA this admission. Would also benefit from a sleep study as an outpatient given his new diagnosis and this can be arranged by his PCP or [facility] upon returning home.  2. CAD - He is s/p CABG in 2012 in [place], [place]. No recent ischemic evaluation by review of records. Echocardiogram pending. On ASA 81mg  daily and Atorvastatin 80mg  daily prior to admission.   3. HLD - Repeat FLP pending. On Atorvastatin 80mg  daily as an outpatient.   4. Hematuria - Being followed by Urology as an outpatient and by review of Care Everywhere he recently underwent cystoscopy last week which showed small stone debris which were aspirated.  Risk Assessment/Risk Scores:    CHA2DS2-VASc Score = 4   This indicates a 4.8% annual risk of stroke. The patient's score is based upon: CHF History: 0 HTN History: 0 Diabetes History: 0 Stroke History: 2 Vascular Disease History: 1 Age Score: 1 Gender Score: 0    For questions or updates, please contact [facility] Please consult [email or web address] for contact info under    Signed, Erma Heritage, PA-C  11/25/2021 2:36 PM

## 2021-11-26 ENCOUNTER — Encounter (HOSPITAL_COMMUNITY)
Admission: EM | Disposition: A | Discharge status: Rehabilitation Facility | Payer: Self-pay | Source: Home / Self Care | Attending: Neurology

## 2021-11-26 ENCOUNTER — Inpatient Hospital Stay (HOSPITAL_COMMUNITY): Payer: Medicare Other

## 2021-11-26 ENCOUNTER — Inpatient Hospital Stay (HOSPITAL_COMMUNITY): Payer: Medicare Other | Admitting: Anesthesiology

## 2021-11-26 ENCOUNTER — Encounter (HOSPITAL_COMMUNITY): Payer: Self-pay | Admitting: Neurology

## 2021-11-26 DIAGNOSIS — I6389 Other cerebral infarction: Secondary | ICD-10-CM

## 2021-11-26 HISTORY — PX: SUBOCCIPITAL CRANIECTOMY CERVICAL LAMINECTOMY: SHX5404

## 2021-11-26 LAB — CBC
HCT: 45.5 % (ref 39.0–52.0)
Hemoglobin: 15.6 g/dL (ref 13.0–17.0)
MCH: 30.7 pg (ref 26.0–34.0)
MCHC: 34.3 g/dL (ref 30.0–36.0)
MCV: 89.6 fL (ref 80.0–100.0)
Platelets: 232 10*3/uL (ref 150–400)
RBC: 5.08 MIL/uL (ref 4.22–5.81)
RDW: 14.6 % (ref 11.5–15.5)
WBC: 13.3 10*3/uL — ABNORMAL HIGH (ref 4.0–10.5)
nRBC: 0 % (ref 0.0–0.2)

## 2021-11-26 LAB — HEMOGLOBIN A1C
Hgb A1c MFr Bld: 5.8 % — ABNORMAL HIGH (ref 4.8–5.6)
Mean Plasma Glucose: 119.76 mg/dL

## 2021-11-26 LAB — TSH: TSH: 1.289 u[IU]/mL (ref 0.350–4.500)

## 2021-11-26 LAB — BASIC METABOLIC PANEL
Anion gap: 11 (ref 5–15)
BUN: 15 mg/dL (ref 8–23)
CO2: 22 mmol/L (ref 22–32)
Calcium: 8.7 mg/dL — ABNORMAL LOW (ref 8.9–10.3)
Chloride: 105 mmol/L (ref 98–111)
Creatinine, Ser: 0.83 mg/dL (ref 0.61–1.24)
GFR, Estimated: >60 mL/min (ref >60)
Glucose, Bld: 117 mg/dL — ABNORMAL HIGH (ref 70–99)
Potassium: 3.8 mmol/L (ref 3.5–5.1)
Sodium: 138 mmol/L (ref 135–145)

## 2021-11-26 LAB — ECHOCARDIOGRAM COMPLETE
Area-P 1/2: 3.61 cm2
Height: 70 in
S' Lateral: 3 cm
Weight: 3460.34 oz

## 2021-11-26 LAB — LIPID PANEL
Cholesterol: 164 mg/dL (ref 0–200)
HDL: 48 mg/dL (ref >40)
LDL Cholesterol: 106 mg/dL — ABNORMAL HIGH (ref 0–99)
Total CHOL/HDL Ratio: 3.4 RATIO
Triglycerides: 52 mg/dL (ref <150)
VLDL: 10 mg/dL (ref 0–40)

## 2021-11-26 LAB — SODIUM
Sodium: 138 mmol/L (ref 135–145)
Sodium: 139 mmol/L (ref 135–145)

## 2021-11-26 IMAGING — MR MR HEAD W/O CM
6 series · 37 of 48 positions shown · non-contrast
Comparison: MRI of the brain [DATE].

CLINICAL DATA: Neuro deficit, acute, stroke suspected.

EXAM:
MRI HEAD WITHOUT CONTRAST
TECHNIQUE: Multiplanar, multiecho pulse sequences of the brain and surrounding
structures were obtained without intravenous contrast.

[Series 3: DWI · axial · 3.0mm · 0.94mm/px · z∈[-127,+25]mm · 9 of 104 slices shown (1 of 2)]
[im 1/104]
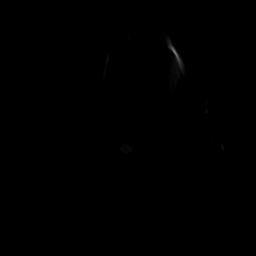
[im 19/104]
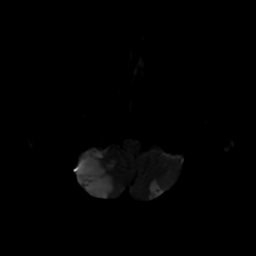
[im 29/104]
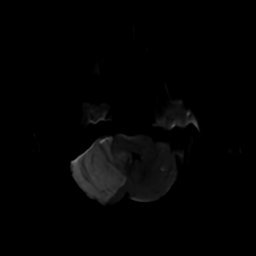
[im 47/104]
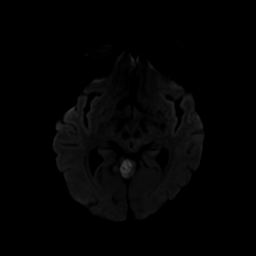
[im 57/104]
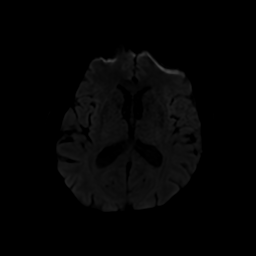
[im 75/104]
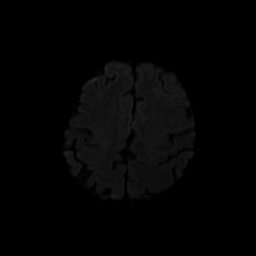
[im 85/104]
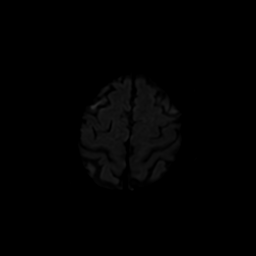
[im 94/104]
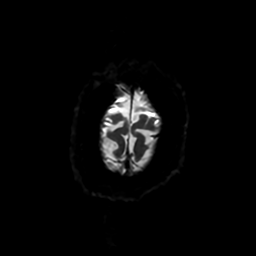
[im 104/104]
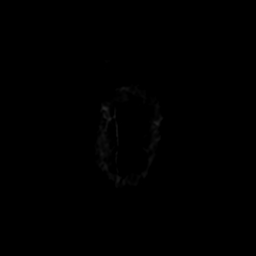

[Series 4: DWI · coronal · 4.0mm · 0.94mm/px · 7 of 71 slices shown (2 of 2)]
[im 1/71]
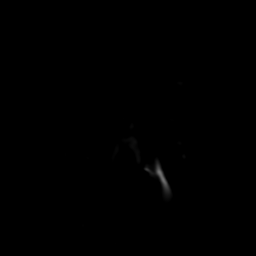
[im 12/71]
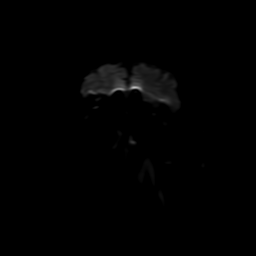
[im 24/71]
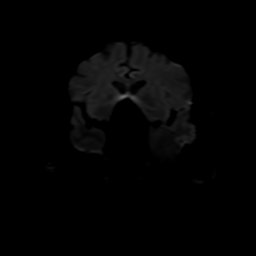
[im 36/71]
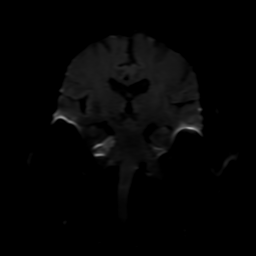
[im 47/71]
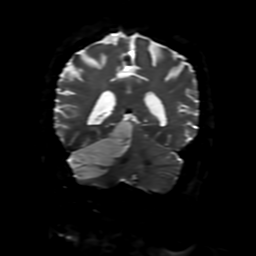
[im 59/71]
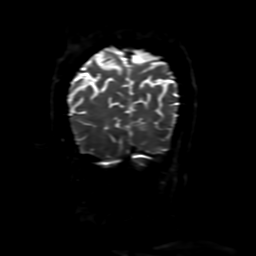
[im 71/71]
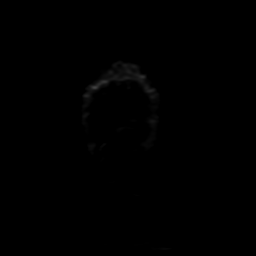

[Series 5: (person_name) · axial · 3.0mm · 0.47mm/px · z∈[-138,+10]mm · 8 of 100 slices shown]
[im 1/100]
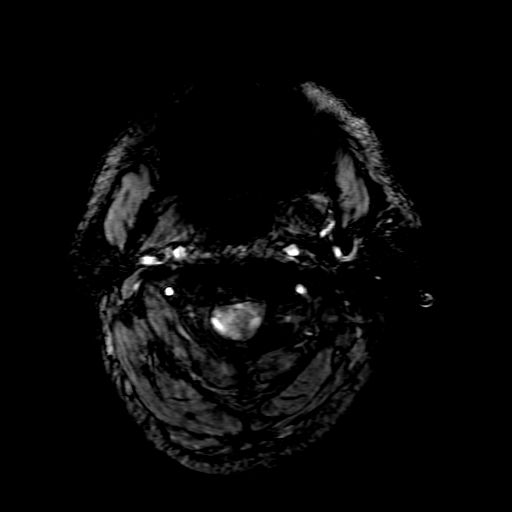
[im 12/100]
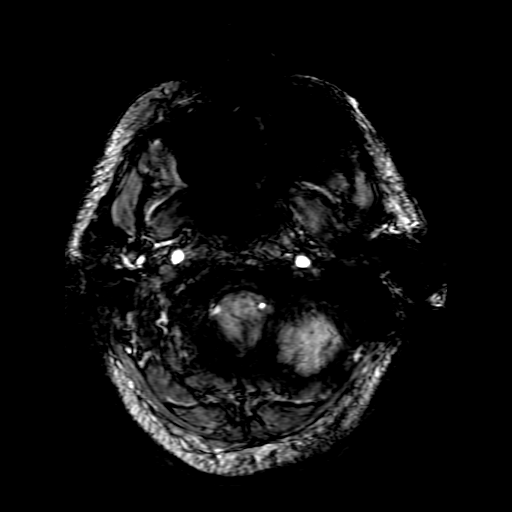
[im 34/100]
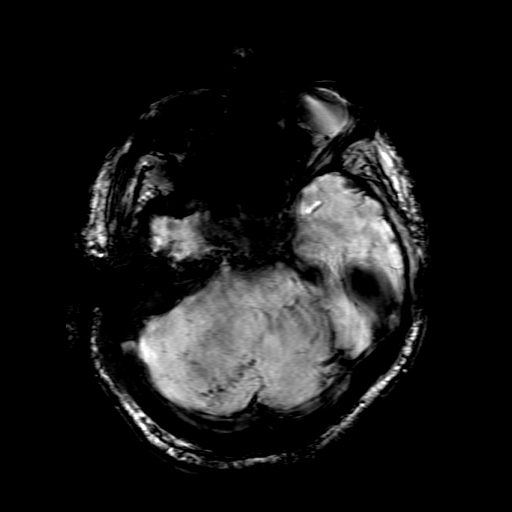
[im 45/100]
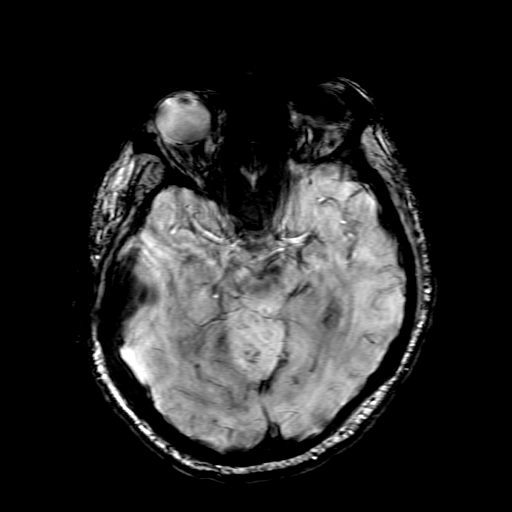
[im 56/100]
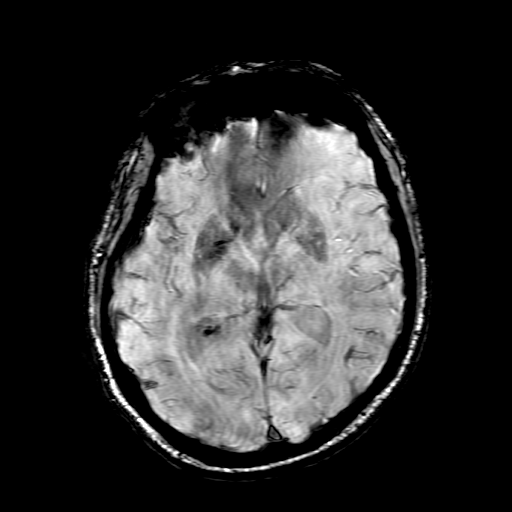
[im 67/100]
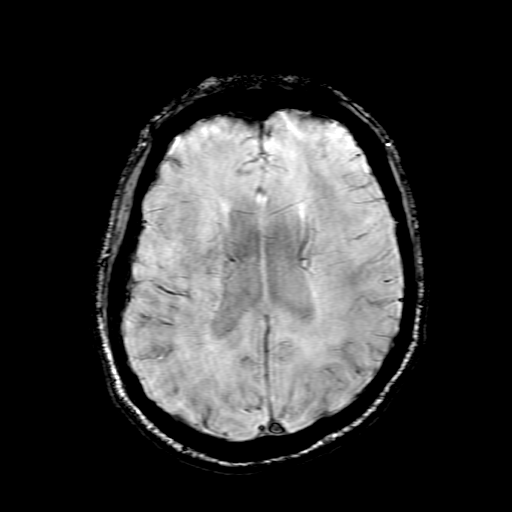
[im 89/100]
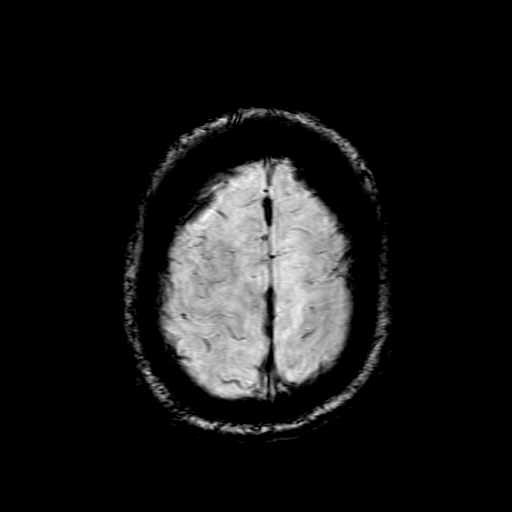
[im 100/100]
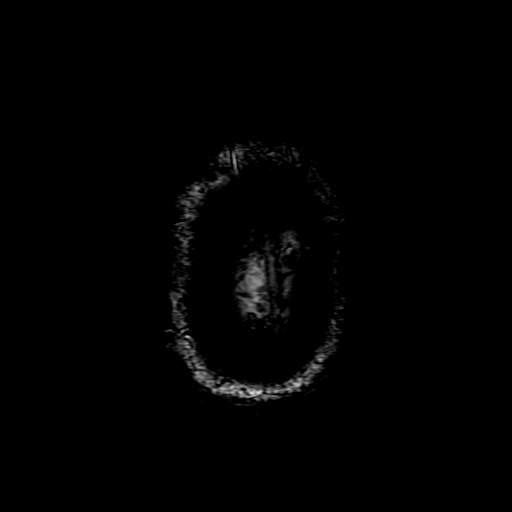

[Series 350: ADC · axial · 3.0mm · 0.94mm/px · z∈[-127,+25]mm · 5 of 52 slices shown (1 of 2)]
[im 1/52]
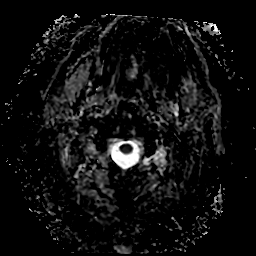
[im 13/52]
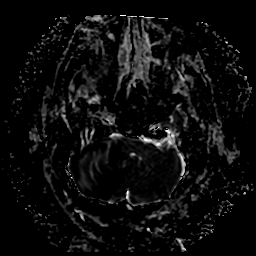
[im 26/52]
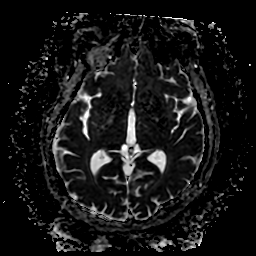
[im 39/52]
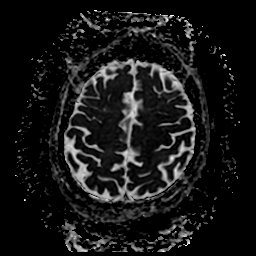
[im 52/52]
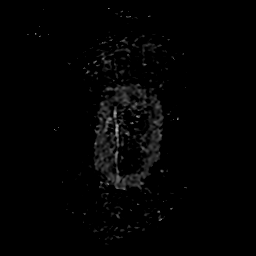

[Series 450: ADC · coronal · 4.0mm · 0.94mm/px · 4 of 35 slices shown (2 of 2)]
[im 1/35]
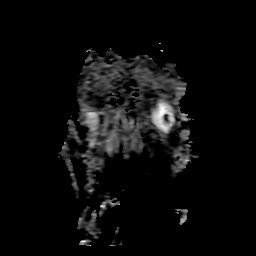
[im 12/35]
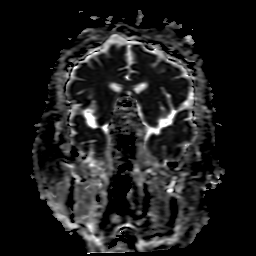
[im 23/35]
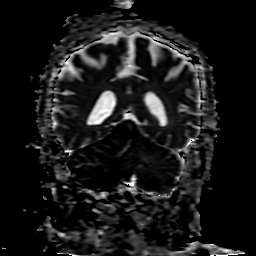
[im 35/35]
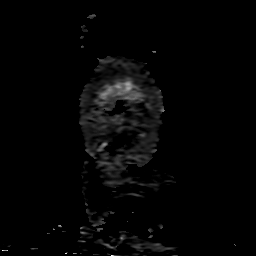

[Series 500: filt_pha: (person_name) · axial · 3.0mm · 0.47mm/px · z∈[-138,-72]mm · 4 of 100 slices shown]
[im 1/100]
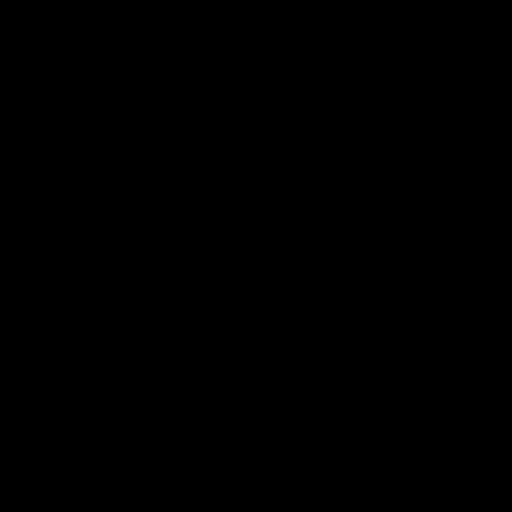
[im 12/100]
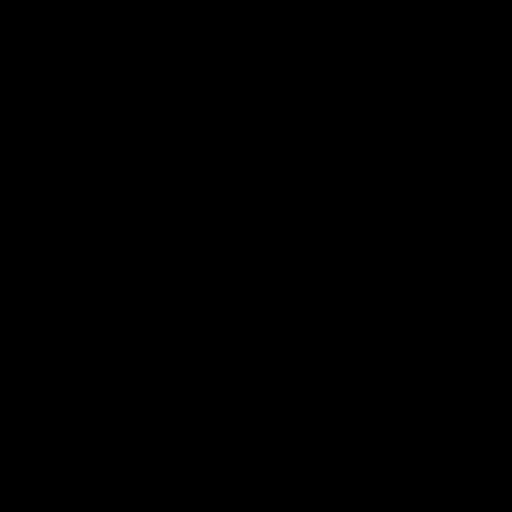
[im 34/100]
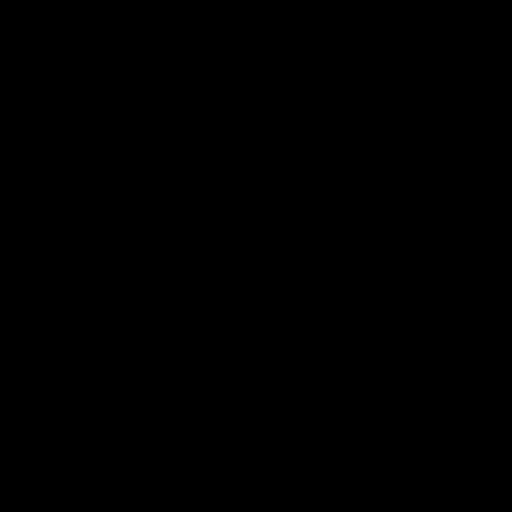
[im 45/100]
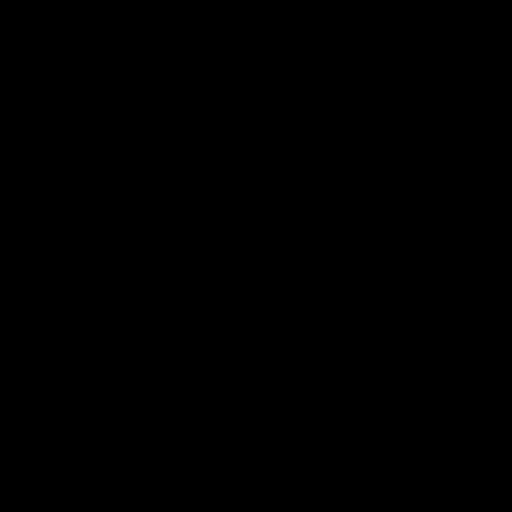

[37 of 48 positions shown; findings below may reference images not displayed]

FINDINGS: Acute infarct involving most of the right hemisphere and extending
to the corresponding medial cerebellar peduncle with multiple
punctate foci of susceptibility artifact consistent with petechial
hemorrhage. Small infarcts in the left cerebellar hemisphere. There
is interval progression of edema with mass effect on the fourth
ventricle and low lying right cerebellar tonsil. There is no
hydrocephalus. No focus of restricted diffusion in the
supratentorial compartment.
IMPRESSION: Interval progression of edema associated to bilateral cerebellar
acute infarcts with mass effect on the fourth ventricle and low
lying right cerebellar tonsil. No hydrocephalus.

## 2021-11-26 IMAGING — CT CT HEAD W/O CM
3 of 4 series · 15 of 47 positions shown, 18 images · non-contrast
Comparison: MRI same day.  CT and MRI yesterday.

CLINICAL DATA: New onset aphasia and disorientation. Hydrocephalus.

EXAM:
CT HEAD WITHOUT CONTRAST
TECHNIQUE: Contiguous axial images were obtained from the base of the skull
through the vertex without intravenous contrast.

[Series 5: head 2.0 h70h · axial · 0.39mm/px · z∈[-174,-38]mm · 9 of 85 slices shown, 12 images]
[im 9/85  brain]
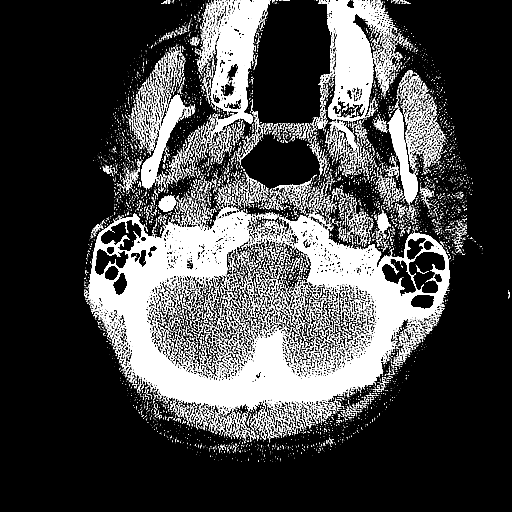
[im 9/85  bone]
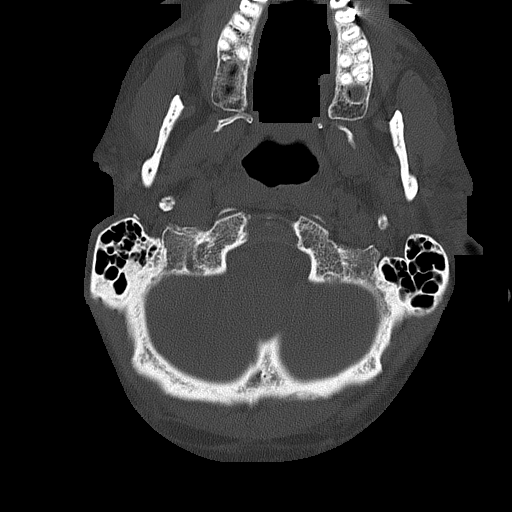
[im 17/85  brain]
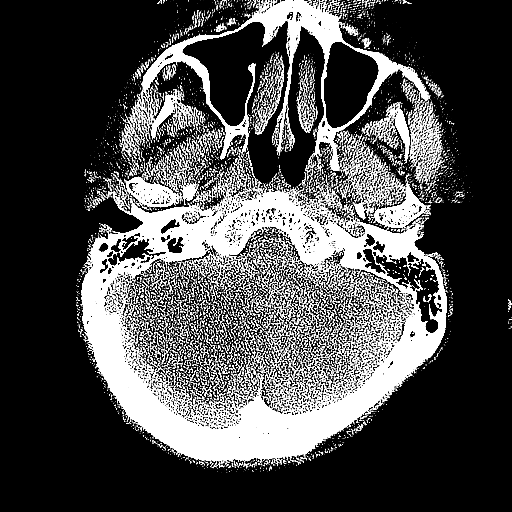
[im 25/85  brain]
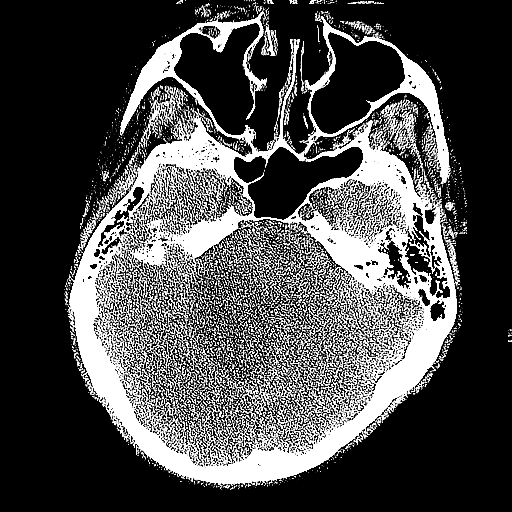
[im 33/85  brain]
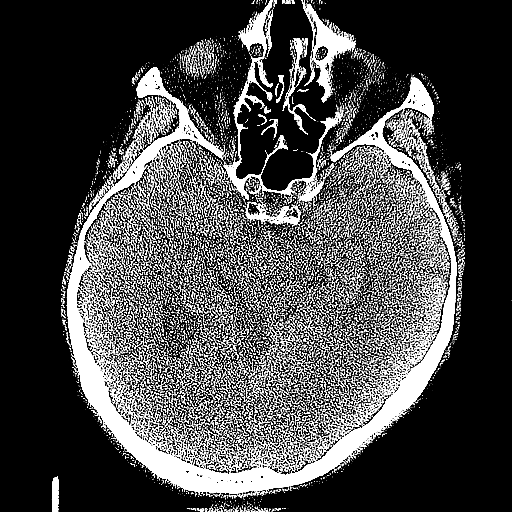
[im 45/85  brain]
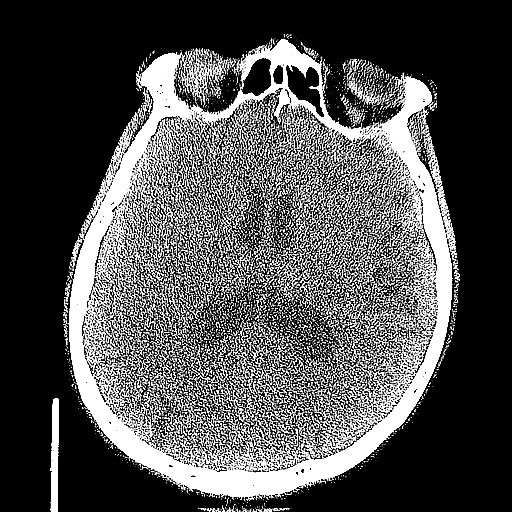
[im 45/85  bone]
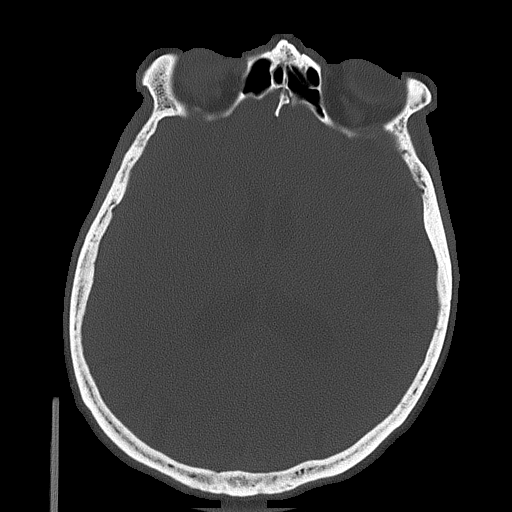
[im 53/85  brain]
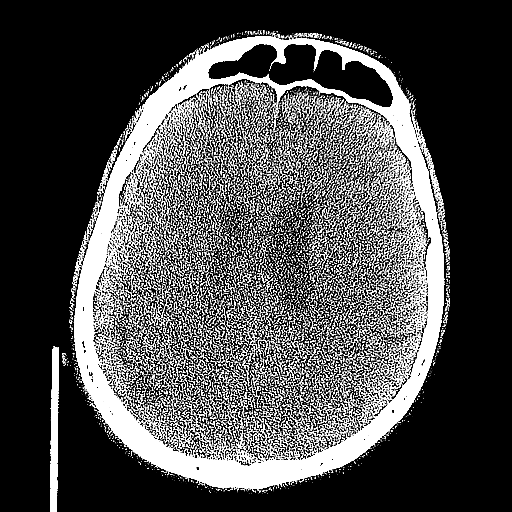
[im 61/85  brain]
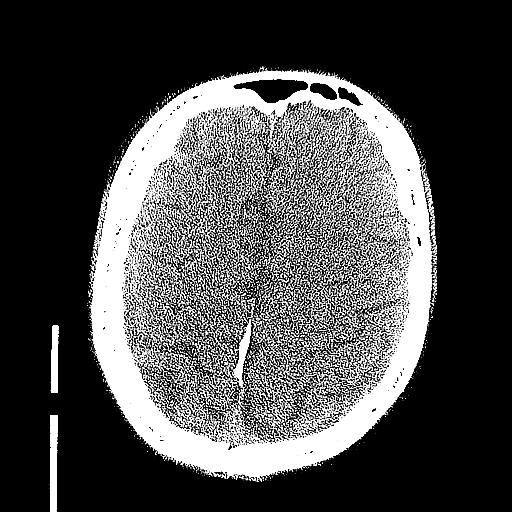
[im 69/85  brain]
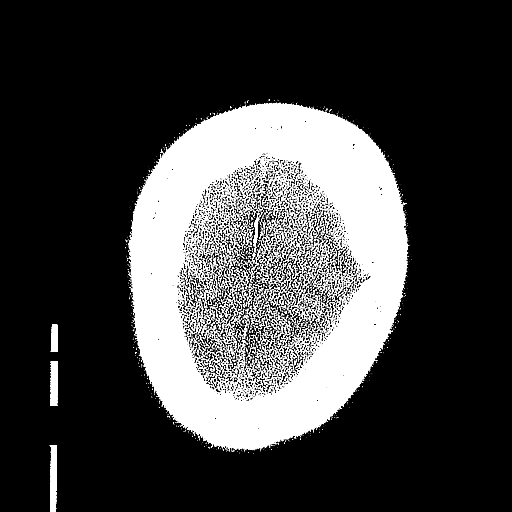
[im 77/85  brain]
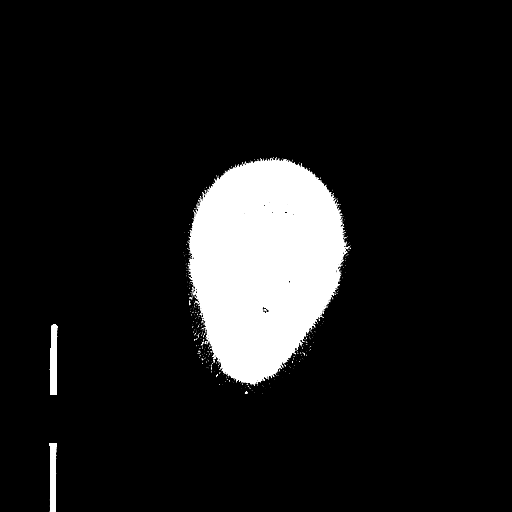
[im 77/85  bone]
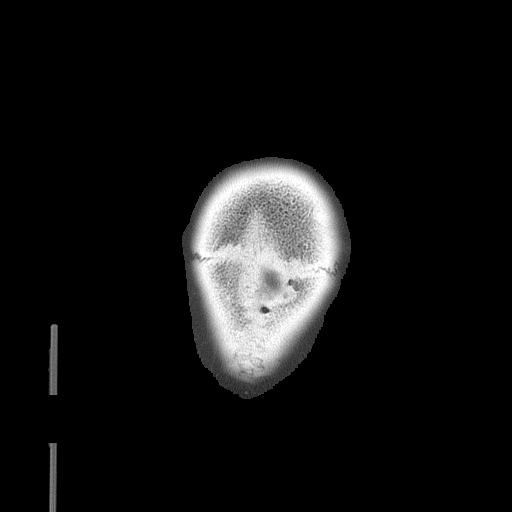

[Series 6: head 3.0 mpr cor · coronal · 0.33mm/px · 3 of 69 slices shown]
[im 23/69  brain]
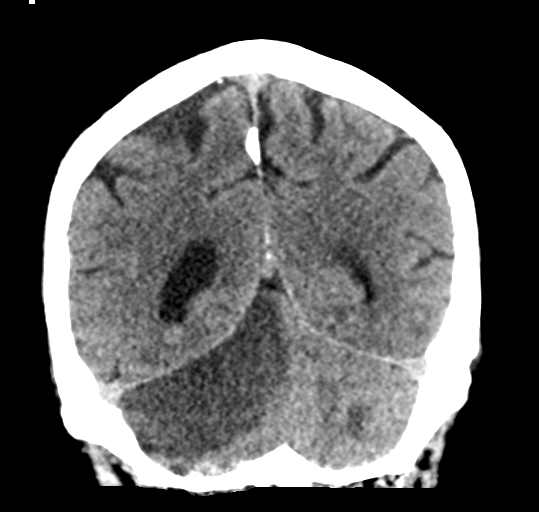
[im 31/69  brain]
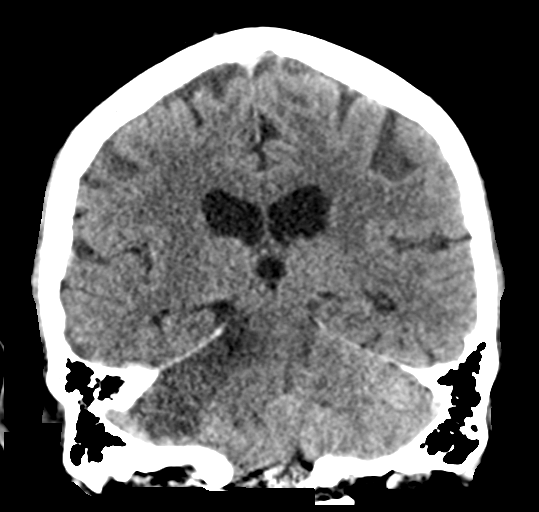
[im 38/69  brain]
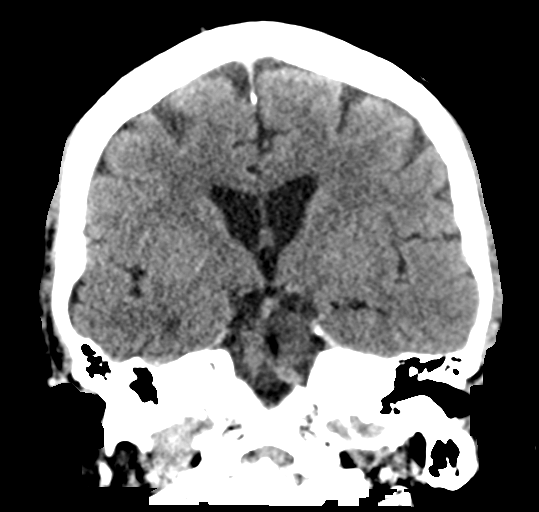

[Series 7: head 3.0 mpr sag · sagittal · 0.33mm/px · 3 of 60 slices shown]
[im 20/60  brain]
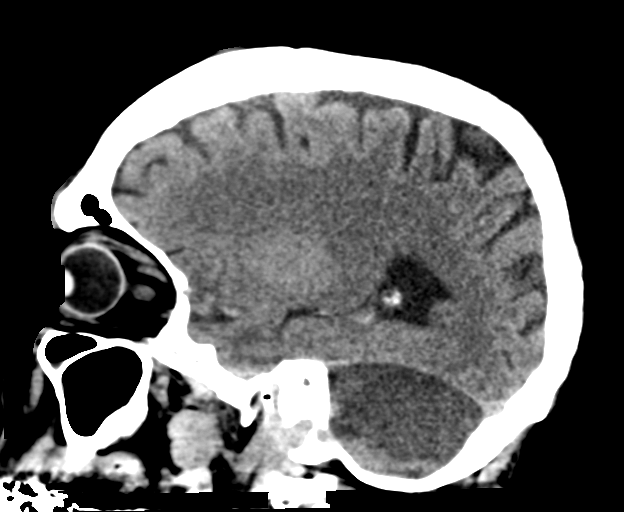
[im 30/60  brain]
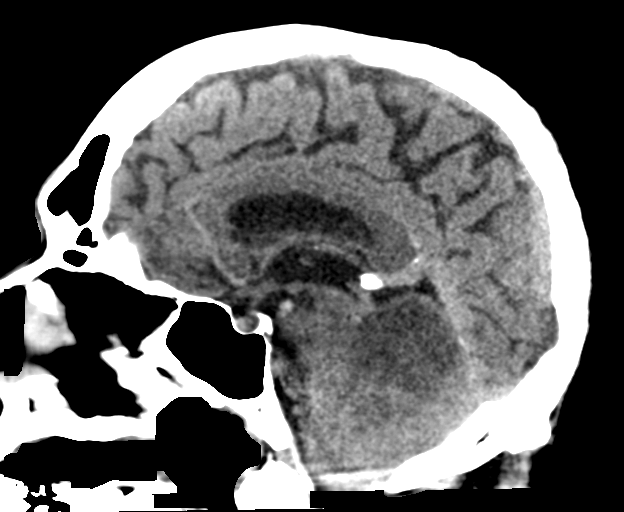
[im 40/60  brain]
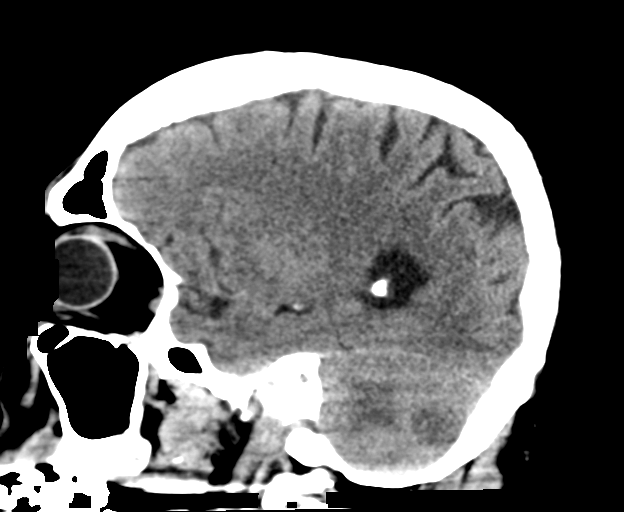

[15 of 47 positions shown; findings below may reference images not displayed]

FINDINGS: Brain: Low-density now present in the areas of acute cerebellar
infarction, much more extensive in the right hemisphere than the
left. Swelling and mass-effect upon the fourth ventricle. No
evidence of hemorrhage. Third ventricular and lateral ventricular
size remains stable, therefore there is no fourth ventricular
obstruction. No new infarction is identified. Cerebral hemispheres
appear normal.

Vascular: No abnormal vascular finding.

Skull: Normal

Sinuses/Orbits: Clear/normal

Other: None
IMPRESSION: Low-density and swelling now present in the areas of acute
cerebellar infarction, much more extensive on the right than the
left. Mass-effect upon the fourth ventricle but no evidence of
ventricular obstruction as evidenced by stable size of the lateral
and third ventricles.

## 2021-11-26 SURGERY — SUBOCCIPITAL CRANIECTOMY CERVICAL LAMINECTOMY/DURAPLASTY
Anesthesia: General | Site: Head

## 2021-11-26 MED ORDER — SODIUM CHLORIDE 3 % IV SOLN
INTRAVENOUS | Status: DC
  Filled 2021-11-26 (×7): qty 500

## 2021-11-26 MED ORDER — BUPIVACAINE HCL (PF) 0.5 % IJ SOLN
INTRAMUSCULAR | Status: AC
  Filled 2021-11-26: qty 30

## 2021-11-26 MED ORDER — BUTALBITAL-APAP-CAFFEINE 50-325-40 MG PO TABS
1.0000 | ORAL_TABLET | Freq: Three times a day (TID) | ORAL | Status: DC | PRN
  Administered 2021-11-26 – 2021-11-29 (×4): 1 via ORAL
  Filled 2021-11-26 (×4): qty 1

## 2021-11-26 MED ORDER — DILTIAZEM HCL 60 MG PO TABS
60.0000 mg | ORAL_TABLET | Freq: Three times a day (TID) | ORAL | Status: DC
  Administered 2021-11-26 – 2021-11-29 (×8): 60 mg via ORAL
  Filled 2021-11-26 (×13): qty 1

## 2021-11-26 MED ORDER — THROMBIN 5000 UNITS EX SOLR
CUTANEOUS | Status: AC
  Filled 2021-11-26: qty 5000

## 2021-11-26 MED ORDER — LIDOCAINE-EPINEPHRINE 2 %-1:100000 IJ SOLN
INTRAMUSCULAR | Status: AC
  Filled 2021-11-26: qty 1

## 2021-11-26 MED ORDER — PROPOFOL 10 MG/ML IV BOLUS
INTRAVENOUS | Status: AC
  Filled 2021-11-26: qty 20

## 2021-11-26 MED ORDER — LABETALOL HCL 5 MG/ML IV SOLN
10.0000 mg | INTRAVENOUS | Status: DC | PRN
  Administered 2021-11-26 – 2021-12-03 (×6): 10 mg via INTRAVENOUS
  Filled 2021-11-26 (×6): qty 4

## 2021-11-26 MED ORDER — ONDANSETRON HCL 4 MG/2ML IJ SOLN: 4.0000 mg | Freq: Four times a day (QID) | INTRAMUSCULAR | Status: DC | PRN

## 2021-11-26 MED ORDER — ONDANSETRON HCL 4 MG/2ML IJ SOLN
4.0000 mg | Freq: Four times a day (QID) | INTRAMUSCULAR | Status: DC
  Administered 2021-11-26: 4 mg via INTRAVENOUS
  Filled 2021-11-26: qty 2

## 2021-11-26 MED ORDER — CHLORHEXIDINE GLUCONATE CLOTH 2 % EX PADS
6.0000 | MEDICATED_PAD | Freq: Every day | CUTANEOUS | Status: DC
  Administered 2021-11-27 – 2021-12-25 (×31): 6 via TOPICAL

## 2021-11-26 MED ORDER — ASPIRIN 81 MG PO CHEW
81.0000 mg | CHEWABLE_TABLET | Freq: Every day | ORAL | Status: DC
  Administered 2021-11-26 – 2021-11-28 (×3): 81 mg via ORAL
  Filled 2021-11-26 (×3): qty 1

## 2021-11-26 MED ORDER — SODIUM CHLORIDE 23.4 % INJECTION (4 MEQ/ML) FOR IV ADMINISTRATION: 120.0000 meq | Freq: Once | INTRAVENOUS | Status: DC

## 2021-11-26 MED ORDER — SODIUM CHLORIDE 3 % IV BOLUS
250.0000 mL | Freq: Once | INTRAVENOUS | Status: AC
  Administered 2021-11-26: 250 mL via INTRAVENOUS
  Filled 2021-11-26: qty 500

## 2021-11-26 MED ORDER — BACITRACIN ZINC 500 UNIT/GM EX OINT
TOPICAL_OINTMENT | CUTANEOUS | Status: AC
  Filled 2021-11-26: qty 28.35

## 2021-11-26 MED ORDER — THROMBIN 20000 UNITS EX SOLR
CUTANEOUS | Status: AC
  Filled 2021-11-26: qty 20000

## 2021-11-26 MED ORDER — CLOPIDOGREL BISULFATE 75 MG PO TABS
75.0000 mg | ORAL_TABLET | Freq: Every day | ORAL | Status: DC
  Administered 2021-11-26 – 2021-11-28 (×3): 75 mg via ORAL
  Filled 2021-11-26 (×3): qty 1

## 2021-11-26 MED ORDER — ATORVASTATIN CALCIUM 80 MG PO TABS
80.0000 mg | ORAL_TABLET | Freq: Every day | ORAL | Status: DC
  Administered 2021-11-26 – 2021-11-28 (×3): 80 mg via ORAL
  Filled 2021-11-26 (×3): qty 1

## 2021-11-26 MED ORDER — FENTANYL CITRATE (PF) 250 MCG/5ML IJ SOLN
INTRAMUSCULAR | Status: AC
  Filled 2021-11-26: qty 5

## 2021-11-26 SURGICAL SUPPLY — 64 items
ADH SKN CLS APL DERMABOND .7 (GAUZE/BANDAGES/DRESSINGS) ×1
APL SKNCLS STERI-STRIP NONHPOA (GAUZE/BANDAGES/DRESSINGS)
BAG COUNTER SPONGE SURGICOUNT (BAG) ×2 IMPLANT
BAG SPNG CNTER NS LX DISP (BAG) ×1
BAND INSRT 18 STRL LF DISP RB (MISCELLANEOUS)
BAND RUBBER #18 3X1/16 STRL (MISCELLANEOUS) IMPLANT
BENZOIN TINCTURE PRP APPL 2/3 (GAUZE/BANDAGES/DRESSINGS) IMPLANT
BLADE CLIPPER SURG (BLADE) ×4 IMPLANT
BLADE ULTRA TIP 2M (BLADE) IMPLANT
BUR ACORN 6.0 PRECISION (BURR) ×2 IMPLANT
BUR ACORN 9.0 PRECISION (BURR) ×1 IMPLANT
BUR PRECISION FLUTE 5.0 (BURR) IMPLANT
CANISTER SUCT 3000ML PPV (MISCELLANEOUS) ×2 IMPLANT
CLIP VESOCCLUDE MED 6/CT (CLIP) IMPLANT
COVER BACK TABLE 60X90IN (DRAPES) IMPLANT
DERMABOND ADVANCED (GAUZE/BANDAGES/DRESSINGS) ×1
DERMABOND ADVANCED .7 DNX12 (GAUZE/BANDAGES/DRESSINGS) ×1 IMPLANT
DRAPE LAPAROTOMY 100X72 PEDS (DRAPES) ×2 IMPLANT
DRAPE MICROSCOPE LEICA (MISCELLANEOUS) IMPLANT
DRAPE WARM FLUID 44X44 (DRAPES) ×2 IMPLANT
DURAPREP 6ML APPLICATOR 50/CS (WOUND CARE) ×2 IMPLANT
ELECT REM PT RETURN 9FT ADLT (ELECTROSURGICAL) ×2
ELECTRODE REM PT RTRN 9FT ADLT (ELECTROSURGICAL) ×1 IMPLANT
EVACUATOR 1/8 PVC DRAIN (DRAIN) IMPLANT
EVACUATOR SILICONE 100CC (DRAIN) IMPLANT
GAUZE 4X4 16PLY ~~LOC~~+RFID DBL (SPONGE) ×2 IMPLANT
GAUZE SPONGE 4X4 12PLY STRL (GAUZE/BANDAGES/DRESSINGS) IMPLANT
GLOVE SURG LTX SZ6.5 (GLOVE) ×1 IMPLANT
GLOVE SURG LTX SZ7.5 (GLOVE) ×2 IMPLANT
GLOVE SURG UNDER POLY LF SZ7 (GLOVE) ×1 IMPLANT
GLOVE SURG UNDER POLY LF SZ7.5 (GLOVE) ×2 IMPLANT
GOWN STRL REUS W/ TWL LRG LVL3 (GOWN DISPOSABLE) ×1 IMPLANT
GOWN STRL REUS W/ TWL XL LVL3 (GOWN DISPOSABLE) IMPLANT
GOWN STRL REUS W/TWL 2XL LVL3 (GOWN DISPOSABLE) IMPLANT
GOWN STRL REUS W/TWL LRG LVL3 (GOWN DISPOSABLE) ×4
GOWN STRL REUS W/TWL XL LVL3 (GOWN DISPOSABLE)
HEMOSTAT POWDER KIT SURGIFOAM (HEMOSTASIS) ×2 IMPLANT
HEMOSTAT SURGICEL 2X14 (HEMOSTASIS) IMPLANT
KIT BASIN OR (CUSTOM PROCEDURE TRAY) ×2 IMPLANT
KIT TURNOVER KIT B (KITS) ×2 IMPLANT
NDL HYPO 25X1 1.5 SAFETY (NEEDLE) ×1 IMPLANT
NEEDLE HYPO 25X1 1.5 SAFETY (NEEDLE) ×2 IMPLANT
NS IRRIG 1000ML POUR BTL (IV SOLUTION) ×3 IMPLANT
PACK CRANIOTOMY CUSTOM (CUSTOM PROCEDURE TRAY) ×2 IMPLANT
PAD ARMBOARD 7.5X6 YLW CONV (MISCELLANEOUS) ×6 IMPLANT
PATTIES SURGICAL .5 X1 (DISPOSABLE) IMPLANT
PATTIES SURGICAL 1/4 X 3 (GAUZE/BANDAGES/DRESSINGS) IMPLANT
PIN MAYFIELD SKULL DISP (PIN) ×1 IMPLANT
SPONGE SURGIFOAM ABS GEL 100 (HEMOSTASIS) IMPLANT
SPONGE T-LAP 4X18 ~~LOC~~+RFID (SPONGE) IMPLANT
STAPLER SKIN PROX WIDE 3.9 (STAPLE) IMPLANT
SUT ETHILON 3 0 FSL (SUTURE) IMPLANT
SUT MNCRL AB 3-0 PS2 27 (SUTURE) ×1 IMPLANT
SUT MON AB 3-0 SH 27 (SUTURE)
SUT MON AB 3-0 SH27 (SUTURE) ×1 IMPLANT
SUT PROLENE 6 0 BV (SUTURE) ×2 IMPLANT
SUT VIC AB 0 CT1 18XCR BRD8 (SUTURE) ×1 IMPLANT
SUT VIC AB 0 CT1 8-18 (SUTURE) ×2
SUT VIC AB 2-0 CP2 18 (SUTURE) ×2 IMPLANT
TOWEL GREEN STERILE (TOWEL DISPOSABLE) ×2 IMPLANT
TOWEL GREEN STERILE FF (TOWEL DISPOSABLE) IMPLANT
TRAY FOLEY MTR SLVR 16FR STAT (SET/KITS/TRAYS/PACK) IMPLANT
UNDERPAD 30X36 HEAVY ABSORB (UNDERPADS AND DIAPERS) IMPLANT
WATER STERILE IRR 1000ML POUR (IV SOLUTION) ×2 IMPLANT

## 2021-11-26 NOTE — Evaluation (Signed)
Physical Therapy Evaluation Patient Details Name: [name] MRN: [id number] DOB: Oct 29, 1946 Today's Date: 11/26/2021  History of Present Illness  75 yo male presenting to ED on 12/11 with ataxia and vertigo. 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 TNK given at 1214. PMH including CAD s/p CABG x4, HLD, OA, nephrolithiasis, urinary retention, BPH with LUTS and prostate cancer.   Clinical Impression  Pt admitted with above. Pt motivated and eager to participate in therapy. Pt indep PTA now presenting with R UE and LE ataxia in addition to impaired balance, disconjugate gaze, impaired vision, and dependency on assist for all mobility and ADls at this time. Pt to greatly benefit from inpatient rehab to achieve maximal functional recovery for safe transition home with spouse. Pt's is from [facility] area as they were here in [place] visiting for a wedding. Wife would prefer to go to an inpatient rehab in [place] if possible. Acute PT to cont to follow.       Recommendations for follow up therapy are one component of a multi-disciplinary discharge planning process, led by the attending physician.  Recommendations may be updated based on patient status, additional functional criteria and insurance authorization.  Follow Up Recommendations Acute inpatient rehab (3hours/day) (from [facility], would like to go to [facility] rehab)    Assistance Recommended at Discharge Frequent or constant Supervision/Assistance  Functional Status Assessment Patient has had a recent decline in their functional status and demonstrates the ability to make significant improvements in function in a reasonable and predictable amount of time.  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      Mobility  Bed Mobility Overal bed mobility: Needs Assistance Bed Mobility: Rolling;Sidelying to Sit Rolling: Min assist (for safety due to quick movements) Sidelying to sit: Mod assist;+2 for safety/equipment;HOB elevated Supine to sit: Mod assist;+2 for safety/equipment     General bed mobility comments: pt quick to move, pt unsteady upon sitting up requiring assist to prevent lean to the L and posteriorly    Transfers Overall transfer level: Needs assistance Equipment used: 2 person hand held assist Transfers: Sit to/from Stand;Bed to chair/wheelchair/BSC Sit to Stand: Mod assist;+2 physical assistance;+2 safety/equipment   Step pivot transfers: Mod assist;+2 physical assistance       General transfer comment: Mod A +2 for correcting posterior lean and gain balance in standing, verbal cues to sequencing stepping pattern for step pvt to chair going to the L    Ambulation/Gait               General Gait Details: pt with noted bilat ataxia significantly worse on R compared to L. Pt sided stepped to [facility] (to the R) and then to the foot of the bed (to the L), pt more steady and required less assist when moving to the L. overall required modAx2 due to instability and ataxia of core and LEs  Stairs            Wheelchair Mobility    Modified Rankin (Stroke Patients Only)       Balance Overall balance assessment: Needs assistance Sitting-balance support: No upper extremity supported;Feet supported Sitting balance-Leahy Scale: Poor Sitting balance - Comments: L lareral lean   Standing balance support: Bilateral  upper extremity supported;During functional activity Standing balance-Leahy Scale: Poor Standing balance comment: posterior and L lean                             Pertinent Vitals/Pain Pain Assessment: 0-10 Pain Score: 5  Pain Location: HA; back of head Pain  Descriptors / Indicators: Discomfort;Grimacing;Pressure Pain Intervention(s): Monitored during session (did mildly increased with sitting EOB)    Home Living Family/patient expects to be discharged to:: Private residence Living Arrangements: Spouse/significant other Available Help at Discharge: Family;Available 24 hours/day Type of Home: House Home Access: Stairs to enter Entrance Stairs-Rails: Right Entrance Stairs-Number of Steps: 2   Home Layout: One level Home Equipment: Standard Walker;Cane - single point;Shower seat - built in;Grab bars - toilet      Prior Function Prior Level of Function : Independent/Modified Independent;Working/employed;Driving               ADLs Comments: 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 Difficulty performing finger to nose test. overshooting when performing targeted reach to R. RUE Coordination: decreased fine motor;decreased gross motor LUE Deficits / Details: Decreased coordination. Difficulty performing finger to nose test. LUE Coordination: decreased fine motor;decreased gross motor    Lower Extremity Assessment Lower Extremity Assessment: RLE deficits/detail RLE Deficits / Details: grossly 4/5 RLE Coordination: decreased gross motor (ataxic movement)    Cervical / Trunk Assessment Cervical / Trunk Assessment:  (per wife pt experiencing some neck problems in which he was scheduled for MRI today)  Communication   Communication: No difficulties  Cognition Arousal/Alertness: Awake/alert Behavior During Therapy: Impulsive (pt HOH but with joking manor t/o session) Overall Cognitive Status: Impaired/Different from baseline Area of Impairment: Following commands;Safety/judgement;Awareness;Problem solving;Attention                    Current Attention Level: Selective   Following Commands: Follows one step commands with increased time Safety/Judgement: Decreased awareness of safety (aware he can't get up alone and is experiencing balance impairment however is quick ot move/impulsive) Awareness: Intellectual Problem Solving: Slow processing;Requires verbal cues;Difficulty sequencing General Comments: Pt very motivated. HOH which could be causing delay in command following, impulsive        General Comments General comments (skin integrity, edema, etc.): pt with noted impaired vision and disconjugate gaze, OT with further evaluation of vision    Exercises     Assessment/Plan    PT Assessment Patient needs continued PT services  PT Problem List Decreased strength;Decreased activity tolerance;Decreased mobility;Decreased coordination;Decreased cognition;Decreased knowledge of use of DME;Decreased safety awareness       PT Treatment Interventions DME instruction;Gait training;Stair training;Functional mobility training;Therapeutic activities;Therapeutic exercise;Balance training;Neuromuscular re-education;Cognitive remediation    PT Goals (Current goals can be found in the Care Plan section)  Acute Rehab PT Goals Patient Stated Goal: get better PT Goal Formulation: With patient/family Time For Goal Achievement: 12/10/21 Potential to Achieve Goals: Good    Frequency Min 4X/week   Barriers to discharge        Co-evaluation PT/OT/SLP Co-Evaluation/Treatment: Yes Reason for Co-Treatment: Complexity of the patient's impairments (multi-system involvement) PT goals addressed during session: Mobility/safety with mobility         AM-PAC PT "6 Clicks" Mobility  Outcome Measure Help needed turning from your  back to your side while in a flat bed without using bedrails?: A Lot Help needed moving from lying on your back to sitting on the side of a flat bed without using bedrails?: A Lot Help needed  moving to and from a bed to a chair (including a wheelchair)?: A Lot Help needed standing up from a chair using your arms (e.g., wheelchair or bedside chair)?: A Lot Help needed to walk in hospital room?: A Lot Help needed climbing 3-5 steps with a railing? : Total 6 Click Score: 11    End of Session Equipment Utilized During Treatment: Gait belt Activity Tolerance: Patient tolerated treatment well Patient left: in chair;with call bell/phone within reach;with chair alarm set;with family/visitor present Nurse Communication: Mobility status (transfer with 2 person assist to the L to return to bed) PT Visit Diagnosis: Unsteadiness on feet (R26.81);Difficulty in walking, not elsewhere classified (R26.2);Ataxic gait (R26.0)    Time: 0930-1008 PT Time Calculation (min) (ACUTE ONLY): 38 min   Charges:   PT Evaluation $PT Eval Moderate Complexity: 1 Mod          Kittie Plater, PT, DPT Acute Rehabilitation Services Pager #: [phone] Office #: [phone]   Berline Lopes 11/26/2021, 12:17 PM

## 2021-11-26 NOTE — Progress Notes (Addendum)
STROKE TEAM PROGRESS NOTE   INTERVAL HISTORY His wife, [name] is at the bedside.  Patient has worked with physical therapy today plan to work with speech therapy.  Patient did fail his initial bedside swallow screen due to vomiting after drinking water.  Blood pressure has been well controlled.  Patient remains on a Cardizem drip for heart rate control.  Plan to convert to p.o. medication when able.  Noticeable right gaze palsy plan, for repeat MRI as this has worsened this morning.   Vitals:   11/26/21 0300 11/26/21 0400 11/26/21 0500 11/26/21 0600  BP: (!) 162/58 (!) 160/51 (!) 161/56 (!) 161/69  Pulse: 73 69 69 67  Resp: (!) 23 (!) 21 (!) 25 (!) 21  Temp:  99.2 F (37.3 C)    TempSrc:  Oral    SpO2: 95% 94% 93% 97%  Weight:      Height:       CBC:  Recent Labs  Lab 11/25/21 1020 11/25/21 1029 11/26/21 0512  WBC 6.8  --  13.3*  NEUTROABS 3.3  --   --   HGB 16.6 16.7 15.6  HCT 48.7 49.0 45.5  MCV 90.7  --  89.6  PLT 223  --  229   Basic Metabolic Panel:  Recent Labs  Lab 11/25/21 1020 11/25/21 1029 11/26/21 0512  NA 139 140 138  K 3.8 3.6 3.8  CL 105 108 105  CO2 22  --  22  GLUCOSE 134* 135* 117*  BUN 18 21 15   CREATININE 1.02 0.90 0.83  CALCIUM 9.1  --  8.7*   Lipid Panel: No results for input(s): CHOL, TRIG, HDL, CHOLHDL, VLDL, LDLCALC in the last 168 hours. HgbA1c:  Recent Labs  Lab 11/26/21 0512  HGBA1C 5.8*   Urine Drug Screen:  Recent Labs  Lab 11/25/21 1646  LABOPIA NONE DETECTED  COCAINSCRNUR NONE DETECTED  LABBENZ NONE DETECTED  AMPHETMU NONE DETECTED  THCU NONE DETECTED  LABBARB NONE DETECTED    Alcohol Level No results for input(s): ETH in the last 168 hours.  IMAGING past 24 hours MR BRAIN WO CONTRAST  Result Date: 11/25/2021 CLINICAL DATA:  Stroke. EXAM: MRI HEAD WITHOUT CONTRAST TECHNIQUE: Multiplanar, multiecho pulse sequences of the brain and surrounding structures were obtained without intravenous contrast. COMPARISON:  CT head  11/25/2021 FINDINGS: Brain: Acute infarct right superior cerebellum. Acute infarct left posterior lateral cerebellum. No associated hemorrhage. Ventricle size normal for age. Negative for hemorrhage or mass. No significant chronic ischemic change. Vascular: Normal arterial flow voids at the skull base. Skull and upper cervical spine: Negative Sinuses/Orbits: Minimal mucosal edema paranasal sinuses. Negative orbit Other: None IMPRESSION: Acute infarct in the cerebellum bilaterally. Negative for hemorrhage. Electronically Signed   By: Franchot Gallo M.D.   On: 11/25/2021 11:53   CT HEAD CODE STROKE WO CONTRAST  Result Date: 11/25/2021 CLINICAL DATA:  Code stroke. 75 year old male with dizziness and abnormal gait. EXAM: CT HEAD WITHOUT CONTRAST TECHNIQUE: Contiguous axial images were obtained from the base of the skull through the vertex without intravenous contrast. COMPARISON:  None. FINDINGS: Brain: Cerebral volume is within normal limits for age. No midline shift, ventriculomegaly, mass effect, evidence of mass lesion, intracranial hemorrhage or evidence of cortically based acute infarction. Gray-white matter differentiation appears symmetric and within normal limits for age throughout the brain. Mild vascular calcification left basal ganglia. Vascular: Mild Calcified atherosclerosis at the skull base. No suspicious intracranial vascular hyperdensity. 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 Mild  plagiocephaly. No acute osseous abnormality identified. Sinuses/Orbits: Visualized paranasal sinuses and mastoids are clear. Tympanic cavities are clear. Other: Visualized orbits and scalp soft tissues are within normal limits. ASPECTS [facility] Stroke Program Early CT Score) Total score (0-10 with 10 being normal): 10 IMPRESSION: 1. Normal for age non contrast CT appearance of the brain. ASPECTS 10. 2. These results were communicated to Dr. [name] at 10:41 am on 11/25/2021 by  text page via the [facility] messaging system. Electronically Signed   By: Genevie Ann M.D.   On: 11/25/2021 10:41   CT ANGIO HEAD NECK W WO CM (CODE STROKE)  Result Date: 11/25/2021 CLINICAL DATA:  Acute neuro deficit. Cerebellar infarct bilaterally on MRI today. EXAM: CT ANGIOGRAPHY HEAD AND NECK TECHNIQUE: Multidetector CT imaging of the head and neck was performed using the standard protocol during bolus administration of intravenous contrast. Multiplanar CT image reconstructions and MIPs were obtained to evaluate the vascular anatomy. Carotid stenosis measurements (when applicable) are obtained utilizing NASCET criteria, using the distal internal carotid diameter as the denominator. CONTRAST:  52mL OMNIPAQUE IOHEXOL 350 MG/ML SOLN COMPARISON:  MRI head 11/26/2019.  CT head 11/25/2021 FINDINGS: CTA NECK FINDINGS Aortic arch: Suboptimal arterial opacification due to scan timing. Minimal atherosclerotic calcification aortic arch. Proximal great vessels widely patent. Right carotid system: Mild atherosclerotic calcification right carotid bifurcation without significant stenosis. Left carotid system: Mild atherosclerotic calcification left carotid bifurcation without significant stenosis. Vertebral arteries: Both vertebral arteries widely patent without significant stenosis. Skeleton: Mild degenerative change cervical spine. No acute skeletal abnormality. Other neck: Mild degenerative change cervical spine. No acute skeletal abnormality. Upper chest: Lung apices clear bilaterally. Review of the MIP images confirms the above findings CTA HEAD FINDINGS Anterior circulation: Internal carotid artery widely patent through the skull base and cavernous segments. Anterior and middle cerebral arteries widely patent and normal bilaterally. Posterior circulation: Suboptimal arterial opacification due to scan timing. Both vertebral arteries patent to the basilar. PICA patent bilaterally. Basilar widely patent without significant  stenosis or thrombus. Mild atherosclerotic irregularity. Superior cerebellar arteries patent bilaterally. Proximal left AICA patent. Right AICA not visualized. Venous sinuses: Normal venous enhancement. Anatomic variants: None Review of the MIP images confirms the above findings IMPRESSION: 1. Negative for intracranial large vessel occlusion. No significant basilar stenosis or thrombus. 2. Suboptimal arterial opacification due to scan timing. 3. Mild atherosclerotic disease in the carotid bifurcation bilaterally. Negative for carotid or vertebral artery stenosis. Electronically Signed   By: Franchot Gallo M.D.   On: 11/25/2021 13:16   CT Angio Abd/Pel W and/or Wo Contrast  Result Date: 11/25/2021 CLINICAL DATA:  GI bleed EXAM: CTA ABDOMEN AND PELVIS WITHOUT AND WITH CONTRAST TECHNIQUE: Multidetector CT imaging of the abdomen and pelvis was performed using the standard protocol during bolus administration of intravenous contrast. Multiplanar reconstructed images and MIPs were obtained and reviewed to evaluate the vascular anatomy. CONTRAST:  156mL OMNIPAQUE IOHEXOL 350 MG/ML SOLN COMPARISON:  None. FINDINGS: VASCULAR Aorta: Normal caliber aorta without aneurysm, dissection, vasculitis or significant stenosis. Moderate atherosclerotic disease of the abdominal aorta consisting of calcified and noncalcified plaque. Celiac: Patent without evidence of aneurysm, dissection, vasculitis or significant stenosis. SMA: Patent without evidence of aneurysm, dissection, vasculitis or significant stenosis. Renals: Both renal arteries are patent without evidence of aneurysm, dissection, vasculitis, fibromuscular dysplasia or significant stenosis. IMA: Patent without evidence of aneurysm, dissection, vasculitis or significant stenosis. Inflow: Patent without evidence of aneurysm, dissection, vasculitis or significant stenosis. Mild scattered calcified and noncalcified plaque. Proximal Outflow: Bilateral common  femoral and  visualized portions of the superficial and profunda femoral arteries are patent without evidence of aneurysm, dissection, vasculitis or significant stenosis. Veins: No obvious venous abnormality within the limitations of this arterial phase study. Review of the MIP images confirms the above findings. NON-VASCULAR Lower chest: Right greater than left bibasilar opacities, likely due to scarring or atelectasis. Coronary artery calcifications. Hepatobiliary: Low-attenuation lesions of the left lobe of the liver which are likely simple cysts. No suspicious hepatic lesions. Gallbladder is unremarkable. No biliary ductal dilation. Pancreas: Unremarkable. No pancreatic ductal dilatation or surrounding inflammatory changes. Spleen: Normal in size without focal abnormality. Adrenals/Urinary Tract: Bilateral adrenal glands are unremarkable. Kidneys enhance symmetrically with no evidence of hydronephrosis or nephrolithiasis. No suspicious filling defects of the opacified renal collecting systems. Bladder is unremarkable. Stomach/Bowel: Small hiatal hernia. Stomach is otherwise unremarkable. Diverticulosis. Normal appearance of the gallbladder. No bowel wall thickening, inflammatory change or evidence of obstruction. Arterial and venous phase images demonstrate no evidence of active GI bleed. Lymphatic: No significant vascular findings are present. No enlarged abdominal or pelvic lymph nodes. Reproductive: Brachytherapy seeds noted in the prostate. Other: Small fat containing left inguinal hernia. Musculoskeletal: Prior left total hip arthroplasty. No aggressive appearing osseous lesions. IMPRESSION: VASCULAR 1. No evidence of active GI bleed. 2. Moderate aortic Atherosclerosis (ICD10-I70.0). NON-VASCULAR 1. Diverticulosis with no evidence of diverticulitis. Electronically Signed   By: Yetta Glassman M.D.   On: 11/25/2021 14:36    PHYSICAL EXAM  Temp:  [98 F (36.7 C)-99.2 F (37.3 C)] 98.3 F (36.8 C) (12/12  1200) Pulse Rate:  [63-164] 63 (12/12 1100) Resp:  [15-27] 19 (12/12 1100) BP: (107-181)/(45-97) 172/75 (12/12 1100) SpO2:  [92 %-99 %] 96 % (12/12 1100)  General - Well nourished, well developed, in no apparent distress.  Ophthalmologic - fundi not visualized due to noncooperation.  Cardiovascular - Regular rhythm and rate.  Mental Status -  Level of arousal and orientation to time, place, and person were intact. Language including expression, naming, repetition, comprehension was assessed and found intact. Attention span and concentration were normal. Recent and remote memory were intact. Fund of Knowledge was assessed and was intact.  Cranial Nerves II - XII - II -no visual field deficit, difficulty assessing right visual field III, IV, VI -Nystagmus.  Right gaze palsy, able to cross midline V - Facial sensation intact bilaterally. VII - Facial movement intact bilaterally. VIII - Hearing & vestibular intact bilaterally. X - Palate elevates symmetrically. XI - Chin turning & shoulder shrug intact bilaterally. XII - Tongue protrusion intact.  Motor Strength - The patient's strength was normal in all extremities and pronator drift was absent.  Bulk was normal and fasciculations were absent.   Motor Tone - Muscle tone was assessed at the neck and appendages and was normal.  Reflexes - The patient's reflexes were symmetrical in all extremities and he had no pathological reflexes.  Sensory - Light touch, temperature/pinprick were assessed and were symmetrical.    Coordination - right upper and lower extremity FTN and HTK moderate ataxia Left upper and lower extremity FTN and HTK mild ataxia  Gait and Station - deferred.   ASSESSMENT/PLAN [name] is a 75 y.o. male with history of CABG x4, hyperlipidemia, prostate cancer, internal hemorrhoid, and a nonobstructive calculus in right interpole with hematuria presenting with acute onset vertigo nausea vomiting and an  inability to walk.  At 10 AM on November 25, 2021 patient developed vertigo nausea and vomiting.  He also noted  abdominal discomfort.  A code stroke was called on his arrival to triage.  Once the patient was in the CT scanner he developed more nausea and vomiting he went to a stat MRI which was positive for bilateral cerebellar strokes.  He then developed ataxia in his bilateral upper and lower extremities.  He was then given TNKase and went for a stat CTA of the head.  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 ASPECTS 10.    CTA head & neck unremarkable MRI  -acute infarct of the cerebellum bilaterally.  negative for hemorrhage.  MRI Repeat 12/12- Interval progression of edema associated to bilateral cerebellar acute infarcts with mass effect on the fourth ventricle and low lying right cerebellar tonsil. No hydrocephalus 2D Echo pending LDL pending HgbA1c 5.8 VTE prophylaxis - SCDs aspirin 81 mg daily prior to admission, now on No antithrombotic within 24h of TNK Therapy recommendations: Inpatient rehab Disposition: Pending  Hypertension Home meds: None Stable On IV cardizem Cleviprex as needed   Atrial filutter - new diagnosis Cardiology on board IV Cardizem changed to p.o. 60 mg Q8 As needed metoprolol for rate control  Hyperlipidemia Home meds: Atorvastatin 80 mg, resumed in hospital LDL pending, goal < 70 Atorvastatin 80 mg daily Continue statin at discharge  Other Stroke Risk Factors Advanced Age >/= 11  Obesity, Body mass index is 31.03 kg/m., BMI >/= 30 associated with increased stroke risk, recommend weight loss, diet and exercise as appropriate  Coronary artery disease s/p CABG  Other Active Problems Abdominal discomfort CTA abdomen pelvis-no evidence of active GI bleed.  Moderate aortic atherosclerosis.  Diverticulosis with no evidence of  diverticulitis. Internal hemorrhoid Nonobstructive calculus in right interpole Restart Flomax prior to removing Foley catheter Hematuria one month ago  Hospital day # 1  Patient seen and examined by NP/APP with MD. MD to update note as needed.   Janine Ores, DNP, FNP-BC [facility] Pager: [phone]  ATTENDING NOTE: I reviewed above note and agree with the assessment and plan. Pt was seen and examined.   75 year old male with history of CAD status post CABG, hyperlipidemia, BPH with hematuria a month ago, prostate cancer admitted for ataxia of gait, nausea vomiting and vertigo.  CT no acute abnormality.  Stat MRI showed bilateral cerebellum infarct right more than left.  CT head and neck no LVO, left VA origin stenosis.  Status post TN K and admitted to ICU.  Overnight, patient found to have new onset a flutter, cardiology consulted, put on IV Cardizem.  Rate controlled and now converted back to sinus rhythm.  IV Cardizem changed to p.o. after passing swallow.  EF 70 to 75%, LDL 106, A1c 5.8.  UDS negative.  Creatinine 0.90.  On exam, wife at bedside.  Patient sitting in chair, awake alert, mildly lethargic.  AAOx3 but mild psychomotor slowing.  Visual fields full, however right gaze deficit, left gaze complete with and sustained nystagmus.  Denies diplopia at that time.  Facial symmetrical, tongue midline.  Muscle strength and sensation symmetrical.  However, bilateral upper and lower extremity mild ataxia, right more severe than left.  Difficulty with repetitive alternating movement.  Per RN, right gait deficit was new from this morning, concerning for right pontine developing infarct, repeat MRI showed interval enlargement of right cerebellar infarct with significant mass-effect to pontine, upper medulla and fourth ventricle.  We will start 3%  saline with bolus for cerebral edema treatment.  Close neuro monitoring, if further neuro changes, repeat CT stat and may consider  neurosurgery consultation.  Etiology for patient stroke likely due to newly diagnosed a flutter not on AC.  Currently within 24 hours of TNK.  Once after 24 hours, will start DAPT and then transition to [place] in 7 to 10 days given large size of cerebellar infarct.  Continue statin.  PT/OT recommend CIR.  For detailed assessment and plan, please refer to above as I have made changes wherever appropriate.   Rosalin Hawking, MD PhD Stroke Neurology 11/26/2021 6:30 PM  This patient is critically ill due to bilateral cerebellar infarct, cerebral edema, a flutter, hypertension and at significant risk of neurological worsening, death form brain stem compression, hydrocephalus, brain herniation, heart failure, seizure. This patient's care requires constant monitoring of vital signs, hemodynamics, respiratory and cardiac monitoring, review of multiple databases, neurological assessment, discussion with family, other specialists and medical decision making of high complexity. I spent 45 minutes of neurocritical care time in the care of this patient. I had long discussion with wife at bedside, updated pt current condition, treatment plan and potential prognosis, and answered all the questions. She expressed understanding and appreciation.      To contact Stroke Continuity provider, please refer to [email or web address] After hours, contact General Neurology

## 2021-11-26 NOTE — Progress Notes (Signed)
3% hypertonic saline 250 mL bolus started. See IV check prior to infusion start below. Infusion started in new IV, IV watch in place, blood return present. 3% administration through peripheral line verified with Dr. Erlinda Hong and pharmacy prior to administration. Patient advised to attempt to keep left arm still during 30 minute bolus.    11/26/21 1659  Peripheral IV 11/26/21 20 G Left;Posterior Hand  Placement Date/Time: 11/26/21 1625   Size (Gauge): 20 G  Orientation: Left;Posterior  Location: Hand  Site Prep: Chlorhexidine (preferred)  Local Anesthetic: None  Person Inserting LDA: Montez Hageman RN  Insertion attempts: 1  Patient Tolerance: Tolera...  Site Assessment Clean;Dry;Intact  Line Status (S)  Flushed;Saline locked;Blood return noted;ivWatch in place (Check immediately prior to 3% hypertonic saline bolus.)  Dressing Type Transparent  Dressing Status Clean;Dry;Intact  Dressing Change Due 12/03/21

## 2021-11-26 NOTE — Evaluation (Signed)
Speech Language Pathology Evaluation Patient Details Name: [name] MRN: [id number] DOB: Jun 19, 1946 Today's Date: 11/26/2021 Time: 1030-1100 SLP Time Calculation (min) (ACUTE ONLY): 30 min  Problem List:  Patient Active Problem List   Diagnosis Date Noted   Stroke [facility]) 11/25/2021   Past Medical History:  Past Medical History:  Diagnosis Date   CAD (coronary artery disease)    a. s/p CABG in 2012   Hyperlipidemia    Past Surgical History:  Past Surgical History:  Procedure Laterality Date   CORONARY ARTERY BYPASS GRAFT  2012   HPI:  75 yo male with stroke risk factors of CAD and HLD. Stat MRI showed bilateral cerebellar infarcts and pt noted to have developed ataxia. Patient was given TNK as he was still in the window. On repeat exam, prior to TNK, his NIHSS was 2 for right sided ataxia. Pt has suffered from nausea and vomiting impacting success with [facility]   Assessment / Plan / Recommendation Clinical Impression  Pt presents with a mild ataxic dysarthria (drunk speech) that he denies awareness of. He additionally was found to have moderate difficulty in alternating attention and following complex commands. He was unable to focus attention fully to mathematical reasoning tasks. Pt unaware of errors. Pt recommended to f/u with AIR for dysarthria and cogntion at next level of care. SLP will follow acutely as able.    SLP Assessment  SLP Recommendation/Assessment: Patient needs continued Speech [place] [facility] SLP Visit Diagnosis: Dysarthria and anarthria (R47.1)    Recommendations for follow up therapy are one component of a multi-disciplinary discharge planning process, led by the attending physician.  Recommendations may be updated based on patient status, additional functional criteria and insurance authorization.    Follow Up Recommendations  Acute inpatient rehab (3hours/day)    Assistance Recommended at Discharge  Frequent or constant  Supervision/Assistance  Functional Status Assessment Patient has had a recent decline in their functional status and demonstrates the ability to make significant improvements in function in a reasonable and predictable amount of time.  Frequency and Duration min 1 x/week  1 week      SLP Evaluation Cognition  Overall Cognitive Status: Impaired/Different from baseline Arousal/Alertness: Awake/alert Orientation Level: Oriented X4 Attention: Sustained;Selective Sustained Attention: Appears intact Selective Attention: Impaired Selective Attention Impairment: Verbal basic;Functional basic Memory: Appears intact Awareness: Impaired Awareness Impairment: Emergent impairment Problem Solving: Impaired Problem Solving Impairment: Verbal basic;Functional basic Executive Function: Reasoning;Self Monitoring;Self Correcting Reasoning: Impaired Reasoning Impairment: Verbal basic;Functional basic Self Monitoring: Impaired Self Monitoring Impairment: Verbal basic;Functional basic Safety/Judgment: Appears intact       Comprehension  Auditory Comprehension Overall Auditory Comprehension: Impaired Yes/No Questions: Within Functional Limits Commands: Impaired One Step Basic Commands: 75-100% accurate Two Step Basic Commands: 75-100% accurate Multistep Basic Commands: 25-49% accurate Complex Commands: 25-49% accurate Conversation: Complex Visual Recognition/Discrimination Discrimination: Within Function Limits Reading Comprehension Reading Status: Within funtional limits    Expression Verbal Expression Overall Verbal Expression: Impaired Initiation: No impairment Automatic Speech: Name;Social Response Level of Generative/Spontaneous Verbalization: Conversation Repetition: No impairment Naming: Impairment Responsive: 76-100% accurate Confrontation: Impaired Convergent: 75-100% accurate Divergent: 75-100% accurate Interfering Components: Attention Written Expression Dominant Hand:  Right   Oral / Motor  Oral Motor/Sensory Function Overall Oral Motor/Sensory Function: Other (comment) (ataxic movement) Motor Speech Overall Motor Speech: Impaired Respiration: Within functional limits Phonation: Normal Resonance: Within functional limits Articulation: Impaired Level of Impairment: Conversation Intelligibility: Intelligible Motor Planning: Witnin functional limits Motor Speech Errors: Unaware   GO  Herbie Baltimore, MA CCC-SLP  Acute Rehabilitation Services Pager [phone] Office [phone]  Lynann Beaver 11/26/2021, 1:08 PM

## 2021-11-26 NOTE — Progress Notes (Signed)
  Transition of Care [facility]) Screening Note   Patient Details  Name: [name] Date of Birth: [birth year]   Transition of Care [facility]) CM/SW Contact:    Geralynn Ochs, LCSW Phone Number: 11/26/2021, 3:30 PM    Transition of Care Department [facility]) has reviewed patient, noting recommendation for inpatient rehab and workup ongoing. We will continue to monitor patient advancement through interdisciplinary progression rounds.

## 2021-11-26 NOTE — Progress Notes (Signed)
  Echocardiogram 2D Echocardiogram has been performed.  [name] 11/26/2021, 3:28 PM

## 2021-11-26 NOTE — Op Note (Signed)
PATIENT: [name]  DAY OF SURGERY: 11/26/21   PRE-OPERATIVE DIAGNOSIS:  Cerebellar stroke, increased intracranial pressure   POST-OPERATIVE DIAGNOSIS:  Same   PROCEDURE:  Suboccipital decompressive craniectomy   SURGEON:  Surgeon(s) and Role:    Judith Part, MD - Primary     ANESTHESIA: ETGA   BRIEF HISTORY: This is a 75 year old man who presented with signs and symptoms of a large cerebellar stroke. He had progressive edema and, despite hypertonics, was having progressive severe headaches and changes in mental status. CTH showed new effacement of the pons, I therefore recommended posterior fossa decompression. This was discussed with the patient as well as risks, benefits, and alternatives and wished to proceed with surgery.   OPERATIVE DETAIL: The patient was taken to the operating room. A formal time out was performed with two patient identifiers and confirmed the operative site. Anesthesia was induced by the anesthesia team. The [place] head holder was applied and the patient was placed prone on the OR table. The mayfield was secured to the table. The operative site was marked, hair was clipped with surgical clippers, the area was then prepped and draped in a sterile fashion.   A linear midline incision was placed from the inion to C2. Soft tissues were dissected and retracted, hemostasis was confirmed. A large posterior fossa decompressive craniectomy was then performed with a combination of high speed drill and rongeurs. The dura was opened in a standard Y shaped fashion and the cerebellum was allowed to swell out without constriction at the edges. As expected, there was diffuse cerebellar edema, worse on the right than left. The CSF was under very high pressure, but flowing well without an obstruction pattern. After decompression the hemispheres regained their pulsatility and appeared well decompressed. The wound was copiously irrigated, hemostasis was confirmed, all  instrument and sponge counts were correct, the incision was then closed in layers. The patient was then returned to anesthesia for emergence. No apparent complications at the completion of the procedure.   EBL:  122mL   DRAINS: none   SPECIMENS: none   Judith Part, MD 11/26/21 11:02 PM

## 2021-11-26 NOTE — Progress Notes (Signed)
BP 181/80. PRN labetalol 10 mg given IV. BP now 162/73.   Montez Hageman, RN

## 2021-11-26 NOTE — Progress Notes (Signed)
Electrophysiology Rounding Note  Patient Name: [name] Date of Encounter: 11/26/2021  Primary Cardiologist: Follows in [place] Electrophysiologist: None   Subjective   The patient is doing well today.  At this time, the patient denies chest pain, shortness of breath, or any new concerns.  Inpatient Medications    Scheduled Meds:  Chlorhexidine Gluconate Cloth  6 each Topical Daily   pantoprazole (PROTONIX) IV  40 mg Intravenous QHS   senna-docusate  2 tablet Oral QHS   tamsulosin  0.4 mg Oral QPC supper   Continuous Infusions:  sodium chloride 50 mL/hr at 11/26/21 0600   clevidipine     diltiazem (CARDIZEM) infusion 5 mg/hr (11/26/21 0600)   PRN Meds: acetaminophen **OR** acetaminophen (TYLENOL) oral liquid 160 mg/5 mL **OR** acetaminophen, metoprolol tartrate   Vital Signs    Vitals:   11/26/21 0300 11/26/21 0400 11/26/21 0500 11/26/21 0600  BP: (!) 162/58 (!) 160/51 (!) 161/56 (!) 161/69  Pulse: 73 69 69 67  Resp: (!) 23 (!) 21 (!) 25 (!) 21  Temp:  99.2 F (37.3 C)    TempSrc:  Oral    SpO2: 95% 94% 93% 97%  Weight:      Height:        Intake/Output Summary (Last 24 hours) at 11/26/2021 0813 Last data filed at 11/26/2021 0600 Gross per 24 hour  Intake 912.25 ml  Output 1700 ml  Net -787.75 ml   Filed Weights   11/25/21 1200  Weight: 98.1 kg    Physical Exam    GEN- The patient is well appearing, alert and oriented x 3 today.   Head- normocephalic, atraumatic Eyes-  Sclera clear, conjunctiva pink Ears- hearing intact Oropharynx- clear Neck- supple Lungs- Clear to ausculation bilaterally, normal work of breathing Heart- Regular rate and rhythm, no murmurs, rubs or gallops GI- soft, NT, ND, + BS Extremities- no clubbing or cyanosis. No edema Skin- no rash or lesion Psych- euthymic mood, full affect Neuro- strength and sensation are intact  Labs    CBC Recent Labs    11/25/21 1020 11/25/21 1029 11/26/21 0512  WBC 6.8  --  13.3*   NEUTROABS 3.3  --   --   HGB 16.6 16.7 15.6  HCT 48.7 49.0 45.5  MCV 90.7  --  89.6  PLT 223  --  676   Basic Metabolic Panel Recent Labs    11/25/21 1020 11/25/21 1029 11/26/21 0512  NA 139 140 138  K 3.8 3.6 3.8  CL 105 108 105  CO2 22  --  22  GLUCOSE 134* 135* 117*  BUN 18 21 15   CREATININE 1.02 0.90 0.83  CALCIUM 9.1  --  8.7*   Liver Function Tests Recent Labs    11/25/21 1020  AST 31  ALT 29  ALKPHOS 86  BILITOT 1.2  PROT 6.9  ALBUMIN 3.9   No results for input(s): LIPASE, AMYLASE in the last 72 hours. Cardiac Enzymes No results for input(s): CKTOTAL, CKMB, CKMBINDEX, TROPONINI in the last 72 hours.   Telemetry    NSR 60-70s this am, paroxysms of flutter overnight (personally reviewed)  Radiology    Tyler BRAIN WO CONTRAST  Result Date: 11/25/2021 CLINICAL DATA:  Stroke. EXAM: MRI HEAD WITHOUT CONTRAST TECHNIQUE: Multiplanar, multiecho pulse sequences of the brain and surrounding structures were obtained without intravenous contrast. COMPARISON:  CT head 11/25/2021 FINDINGS: Brain: Acute infarct right superior cerebellum. Acute infarct left posterior lateral cerebellum. No associated hemorrhage. Ventricle size normal for age. Negative  for hemorrhage or mass. No significant chronic ischemic change. Vascular: Normal arterial flow voids at the skull base. Skull and upper cervical spine: Negative Sinuses/Orbits: Minimal mucosal edema paranasal sinuses. Negative orbit Other: None IMPRESSION: Acute infarct in the cerebellum bilaterally. Negative for hemorrhage. Electronically Signed   By: Franchot Gallo M.D.   On: 11/25/2021 11:53   CT HEAD CODE STROKE WO CONTRAST  Result Date: 11/25/2021 CLINICAL DATA:  Code stroke. 75 year old male with dizziness and abnormal gait. EXAM: CT HEAD WITHOUT CONTRAST TECHNIQUE: Contiguous axial images were obtained from the base of the skull through the vertex without intravenous contrast. COMPARISON:  None. FINDINGS: Brain: Cerebral  volume is within normal limits for age. No midline shift, ventriculomegaly, mass effect, evidence of mass lesion, intracranial hemorrhage or evidence of cortically based acute infarction. Gray-white matter differentiation appears symmetric and within normal limits for age throughout the brain. Mild vascular calcification left basal ganglia. Vascular: Mild Calcified atherosclerosis at the skull base. No suspicious intracranial vascular hyperdensity. 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 Mild plagiocephaly. No acute osseous abnormality identified. Sinuses/Orbits: Visualized paranasal sinuses and mastoids are clear. Tympanic cavities are clear. Other: Visualized orbits and scalp soft tissues are within normal limits. ASPECTS [facility] Stroke Program Early CT Score) Total score (0-10 with 10 being normal): 10 IMPRESSION: 1. Normal for age non contrast CT appearance of the brain. ASPECTS 10. 2. These results were communicated to Dr. [name] at 10:41 am on 11/25/2021 by text page via the [facility] messaging system. Electronically Signed   By: Genevie Ann M.D.   On: 11/25/2021 10:41   CT ANGIO HEAD NECK W WO CM (CODE STROKE)  Result Date: 11/25/2021 CLINICAL DATA:  Acute neuro deficit. Cerebellar infarct bilaterally on MRI today. EXAM: CT ANGIOGRAPHY HEAD AND NECK TECHNIQUE: Multidetector CT imaging of the head and neck was performed using the standard protocol during bolus administration of intravenous contrast. Multiplanar CT image reconstructions and MIPs were obtained to evaluate the vascular anatomy. Carotid stenosis measurements (when applicable) are obtained utilizing NASCET criteria, using the distal internal carotid diameter as the denominator. CONTRAST:  72mL OMNIPAQUE IOHEXOL 350 MG/ML SOLN COMPARISON:  MRI head 11/26/2019.  CT head 11/25/2021 FINDINGS: CTA NECK FINDINGS Aortic arch: Suboptimal arterial opacification due to scan timing. Minimal atherosclerotic calcification  aortic arch. Proximal great vessels widely patent. Right carotid system: Mild atherosclerotic calcification right carotid bifurcation without significant stenosis. Left carotid system: Mild atherosclerotic calcification left carotid bifurcation without significant stenosis. Vertebral arteries: Both vertebral arteries widely patent without significant stenosis. Skeleton: Mild degenerative change cervical spine. No acute skeletal abnormality. Other neck: Mild degenerative change cervical spine. No acute skeletal abnormality. Upper chest: Lung apices clear bilaterally. Review of the MIP images confirms the above findings CTA HEAD FINDINGS Anterior circulation: Internal carotid artery widely patent through the skull base and cavernous segments. Anterior and middle cerebral arteries widely patent and normal bilaterally. Posterior circulation: Suboptimal arterial opacification due to scan timing. Both vertebral arteries patent to the basilar. PICA patent bilaterally. Basilar widely patent without significant stenosis or thrombus. Mild atherosclerotic irregularity. Superior cerebellar arteries patent bilaterally. Proximal left AICA patent. Right AICA not visualized. Venous sinuses: Normal venous enhancement. Anatomic variants: None Review of the MIP images confirms the above findings IMPRESSION: 1. Negative for intracranial large vessel occlusion. No significant basilar stenosis or thrombus. 2. Suboptimal arterial opacification due to scan timing. 3. Mild atherosclerotic disease in the carotid bifurcation bilaterally. Negative for carotid or vertebral artery stenosis. Electronically Signed  By: Franchot Gallo M.D.   On: 11/25/2021 13:16   CT Angio Abd/Pel W and/or Wo Contrast  Result Date: 11/25/2021 CLINICAL DATA:  GI bleed EXAM: CTA ABDOMEN AND PELVIS WITHOUT AND WITH CONTRAST TECHNIQUE: Multidetector CT imaging of the abdomen and pelvis was performed using the standard protocol during bolus administration of  intravenous contrast. Multiplanar reconstructed images and MIPs were obtained and reviewed to evaluate the vascular anatomy. CONTRAST:  131mL OMNIPAQUE IOHEXOL 350 MG/ML SOLN COMPARISON:  None. FINDINGS: VASCULAR Aorta: Normal caliber aorta without aneurysm, dissection, vasculitis or significant stenosis. Moderate atherosclerotic disease of the abdominal aorta consisting of calcified and noncalcified plaque. Celiac: Patent without evidence of aneurysm, dissection, vasculitis or significant stenosis. SMA: Patent without evidence of aneurysm, dissection, vasculitis or significant stenosis. Renals: Both renal arteries are patent without evidence of aneurysm, dissection, vasculitis, fibromuscular dysplasia or significant stenosis. IMA: Patent without evidence of aneurysm, dissection, vasculitis or significant stenosis. Inflow: Patent without evidence of aneurysm, dissection, vasculitis or significant stenosis. Mild scattered calcified and noncalcified plaque. Proximal Outflow: Bilateral common femoral and visualized portions of the superficial and profunda femoral arteries are patent without evidence of aneurysm, dissection, vasculitis or significant stenosis. Veins: No obvious venous abnormality within the limitations of this arterial phase study. Review of the MIP images confirms the above findings. NON-VASCULAR Lower chest: Right greater than left bibasilar opacities, likely due to scarring or atelectasis. Coronary artery calcifications. Hepatobiliary: Low-attenuation lesions of the left lobe of the liver which are likely simple cysts. No suspicious hepatic lesions. Gallbladder is unremarkable. No biliary ductal dilation. Pancreas: Unremarkable. No pancreatic ductal dilatation or surrounding inflammatory changes. Spleen: Normal in size without focal abnormality. Adrenals/Urinary Tract: Bilateral adrenal glands are unremarkable. Kidneys enhance symmetrically with no evidence of hydronephrosis or nephrolithiasis. No  suspicious filling defects of the opacified renal collecting systems. Bladder is unremarkable. Stomach/Bowel: Small hiatal hernia. Stomach is otherwise unremarkable. Diverticulosis. Normal appearance of the gallbladder. No bowel wall thickening, inflammatory change or evidence of obstruction. Arterial and venous phase images demonstrate no evidence of active GI bleed. Lymphatic: No significant vascular findings are present. No enlarged abdominal or pelvic lymph nodes. Reproductive: Brachytherapy seeds noted in the prostate. Other: Small fat containing left inguinal hernia. Musculoskeletal: Prior left total hip arthroplasty. No aggressive appearing osseous lesions. IMPRESSION: VASCULAR 1. No evidence of active GI bleed. 2. Moderate aortic Atherosclerosis (ICD10-I70.0). NON-VASCULAR 1. Diverticulosis with no evidence of diverticulitis. Electronically Signed   By: Yetta Glassman M.D.   On: 11/25/2021 14:36    Patient Profile     [name] is a 75yo man with CAD s/p CABG in 2012, HLD, prostate cancer who I am seeing today for new diagnosis atrial flutter at the request of Dr [name]. He presented to the ER today as a code stroke and was found to have a cerebellar infarct. He has received thrombolytics and is being admitted to the neuro ICU for monitoring. He is with his wife today. They live in [place]. No prior history of AF/AFL. Not previously on anticoagulation. Does have history of bladder/kidney stones with intermittent hematuria.   Assessment & Plan    Atrial flutter New diagnosis, CHADS2VASc of at least 4 No obvious atrial fibrillation on tele thus far.  [place] once appropriate per neurology This am rates controlled and has converted to NSR on diltiazem Now in NSR, could consider rhythm control to keep in sinus, though options somewhat limited with CAD.  Echo pending.   2. CAD Denies s/s of ischemia  3. Stroke Per neurology team  EP will follow with you. Chronically, they plan to follow  back up with his cardiologist in [place].  For questions or updates, please contact [place] Please consult [email or web address] for contact info under Cardiology/STEMI.  Signed, Shirley Friar, PA-C  11/26/2021, 8:13 AM

## 2021-11-26 NOTE — Progress Notes (Signed)
Patient noted to have disorientation to month, new aphasia, and new dysarthria on assessment. Dr. Erlinda Hong notified of change in neurologic status and NIH score changing from 5 at 1700 to 8 at 1800. Order for stat head CT placed. Pt. taken to CT and back without incident.    11/26/21 1800  NIH Stroke Scale ( + Modified Stroke Scale Criteria)   Interval (S)  Neuro change  Level of Consciousness (1a.)    0  LOC Questions (1b. )   + 1  LOC Commands (1c. )   +  0  Best Gaze (2. )  + 1  Visual (3. )  + 2  Facial Palsy (4. )     0  Motor Arm, Left (5a. )   + 0  Motor Arm, Right (5b. )   + 0  Motor Leg, Left (6a. )   + 0  Motor Leg, Right (6b. )   + 0  Limb Ataxia (7. ) 1  Sensory (8. )   + 0  Best Language (9. )   + 1  Dysarthria (10. ) 1  Extinction/Inattention (11.)   + 1  Modified SS Total  + 6  Complete NIHSS TOTAL 8

## 2021-11-26 NOTE — Consult Note (Signed)
Neurosurgery Consultation  Reason for Consult: Cerebellar stroke Referring Physician: Erlinda Hong  CC: Headache  HPI: This is a 75 y.o. man that presents with bilateral R>L cerebellar strokes. He's been on 3% but has had progressive worsening mental status and severe headaches. He's currently somnolent, complaining of the worst headache of his life, and nauseated but not vomiting, complaining of some mild diplopia but unable to describe which directions make it worse. No other complaints except for ataxia and feeling confused. No recent use of anti-platelet or anti-coagulant medications.   ROS: A 14 point ROS was performed and is negative except as noted in the HPI.   PMHx:  Past Medical History:  Diagnosis Date   CAD (coronary artery disease)    a. s/p CABG in 2012   Hyperlipidemia    FamHx:  Family History  Problem Relation Age of Onset   Cancer Brother    Coronary artery disease Brother    SocHx:  reports that he has quit smoking. His smoking use included cigarettes. He has never used smokeless tobacco. He reports current alcohol use of about 3.0 standard drinks per week. He reports that he does not use drugs.  Exam: Vital signs in last 24 hours: Temp:  [98.3 F (36.8 C)-99.5 F (37.5 C)] 99.4 F (37.4 C) (12/12 2000) Pulse Rate:  [61-74] 61 (12/12 2000) Resp:  [15-25] 22 (12/12 2000) BP: (107-184)/(51-88) 129/53 (12/12 2000) SpO2:  [91 %-97 %] 91 % (12/12 2000) General: Awake, alert, cooperative, lying in bed in NAD Head: Normocephalic and atruamatic HEENT: Neck supple Pulmonary: breathing room air comfortably, no evidence of increased work of breathing Cardiac: RRR Abdomen: S NT ND Extremities: Warm and well perfused x4 Neuro: Somnolent, Ox2, PERRL, EOMI but unable to gaze past midline to the right, FS Strength 5/5 on L, 5/5 RLE, 4/5 RUE with severe appendicular ataxia   Assessment and Plan: 75 y.o. man w/ large R cerebellar stroke. [place] personally reviewed, which shows  worsening posterior fossa compression, now with effacement of the pons ventrally, worsened than prior.   -given his symptoms and CT findings, I agree that he needs decompression. Will take to the OR for suboccipital craniectomy  Judith Part, MD 11/26/21 10:35 PM [facility]

## 2021-11-26 NOTE — Evaluation (Signed)
Occupational Therapy Evaluation Patient Details Name: [name] MRN: [id number] DOB: [birth year] Today's Date: 11/26/2021   History of Present Illness 75 yo male presenting to ED on 12/11 with ataxia and vertigo. 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 TNK given at 1214. PMH including CAD s/p CABG x4, HLD, OA, nephrolithiasis, urinary retention, BPH with LUTS and prostate cancer.   Clinical Impression   PTA, pt was living with his wife and was independent; working, driving, and enjoys Marketing executive. Pt currently requiring Mod A for UB ADLs, Max A for LB ADLs, and Mod-Max A +2 for functional mobility. Pt presenting with decreased coordination, balance, and vision impacting his functional performance. Pt is very motivated, has good family support, and was very active and independent PTA. Recommend dc to inpatient rehab for intensive OT to optimize independence, safety, and reduce caregiver burden. Pt will require further acute OT to facilitate safe dc.      Recommendations for follow up therapy are one component of a multi-disciplinary discharge planning process, led by the attending physician.  Recommendations may be updated based on patient status, additional functional criteria and insurance authorization.   Follow Up Recommendations  Acute inpatient rehab (3hours/day) (preference for [facility] as [facility] are from [facility])    Assistance Recommended at Discharge Frequent or constant Supervision/Assistance  Functional Status Assessment  Patient has had a recent decline in their functional status and demonstrates the ability to make significant improvements in function in a reasonable and predictable amount of time.  Equipment Recommendations  BSC/3in1    Recommendations for Other Services PT consult;Rehab consult;Speech consult     Precautions / Restrictions Precautions Precautions: Fall Restrictions Other Position/Activity Restrictions: Wears  a lift in R shoe s/p hip replacement      Mobility Bed Mobility Overal bed mobility: Needs Assistance Bed Mobility: Supine to Sit     Supine to sit: Mod assist;+2 for safety/equipment     General bed mobility comments: Mod A to correct L lateral lean once at EOB    Transfers Overall transfer level: Needs assistance   Transfers: Sit to/from Stand Sit to Stand: Mod assist;+2 physical assistance;+2 safety/equipment           General transfer comment: Mod A +2 for correcting posterior lean and gain balance in standing      Balance Overall balance assessment: Needs assistance Sitting-balance support: No upper extremity supported;Feet supported Sitting balance-Leahy Scale: Poor Sitting balance - Comments: L lareral lean   Standing balance support: Bilateral upper extremity supported;During functional activity Standing balance-Leahy Scale: Poor Standing balance comment: posterior and L lean                           ADL either performed or assessed with clinical judgement   ADL Overall ADL's : Needs assistance/impaired Eating/Feeding: NPO   Grooming: Moderate assistance;Sitting   Upper Body Bathing: Moderate assistance;Sitting   Lower Body Bathing: Maximal assistance;Sit to/from stand;+2 for physical assistance;+2 for safety/equipment   Upper Body Dressing : Moderate assistance;Sitting   Lower Body Dressing: Maximal assistance;Sit to/from stand;+2 for physical assistance;+2 for safety/equipment   Toilet Transfer: Maximal assistance;+2 for physical assistance;Ambulation (two person hand held)           Functional mobility during ADLs: Maximal assistance;+2 for physical assistance;+2 for safety/equipment General ADL Comments: Decreased coorindation, strength, balance and cognition     Vision Baseline Vision/History: 1 Wears glasses (reading) Vision Assessment?: Yes Eye  Alignment: Impaired (comment) Ocular Range of Motion: Impaired-to be further  tested in functional context;Restricted on the right Alignment/Gaze Preference: Other (comment) (unable to track passed midline to R) Tracking/Visual Pursuits: Decreased smoothness of eye movement to RIGHT inferior field;Decreased smoothness of eye movement to RIGHT superior field;Other (comment) (lateral L pulsating with gazing to L) Convergence: Impaired (comment) Visual Fields: Other (comment) (no noted fieldcut with peripheral vision testing) Additional Comments: Will continue to follow. Unable to track to R passed midline. dysconjugate gaze and R eye with decreased movement. unable to conjugate. L lateral beats noted with L gaze.     Perception     Praxis      Pertinent Vitals/Pain Pain Assessment: 0-10 Pain Score: 5  Pain Location: HA; back of head Pain Descriptors / Indicators: Discomfort;Grimacing;Pressure Pain Intervention(s): Monitored during session;Limited activity within patient's tolerance;Repositioned     Hand Dominance Right   Extremity/Trunk Assessment Upper Extremity Assessment Upper Extremity Assessment: RUE deficits/detail;LUE deficits/detail RUE Deficits / Details: Decreased coordination. Difficulty performing finger to nose test. overshooting when performing targeted reach to R. RUE Coordination: decreased fine motor;decreased gross motor LUE Deficits / Details: Decreased coordination. Difficulty performing finger to nose test. LUE Coordination: decreased fine motor;decreased gross motor   Lower Extremity Assessment Lower Extremity Assessment: Defer to PT evaluation   Cervical / Trunk Assessment Cervical / Trunk Assessment: Other exceptions (posterior lean in standing)   Communication Communication Communication: No difficulties   Cognition Arousal/Alertness: Awake/alert Behavior During Therapy: Flat affect Overall Cognitive Status: Impaired/Different from baseline Area of Impairment: Following commands;Safety/judgement;Awareness;Problem  solving;Attention                   Current Attention Level: Selective   Following Commands: Follows one step commands with increased time Safety/Judgement: Decreased awareness of safety;Decreased awareness of deficits Awareness: Intellectual Problem Solving: Slow processing;Requires verbal cues;Difficulty sequencing General Comments: Pt very motivated. Requiring increased time for following commands.     General Comments  Wife present throughout. BP supine 174/72, sitting EOB 188/70, sitting EOB after 5 min 170/83, and sitting in recliner afte transfer 175/71    Exercises     Shoulder Instructions      Home Living Family/patient expects to be discharged to:: Private residence Living Arrangements: Spouse/significant other Available Help at Discharge: Family;Available 24 hours/day Type of Home: House Home Access: Stairs to enter [employer] of Steps: 2 Entrance Stairs-Rails: Right Home Layout: One level     Bathroom Shower/Tub: Occupational psychologist: Standard     Home Equipment: Standard Walker;Cane - single point;Shower seat - built in;Grab bars - toilet          Prior Functioning/Environment Prior Level of Function : Independent/Modified Independent;Working/employed;Driving               ADLs Comments: 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        OT Problem List: Decreased strength;Decreased range of motion;Decreased activity tolerance;Impaired balance (sitting and/or standing);Decreased safety awareness;Decreased knowledge of use of DME or AE;Decreased knowledge of precautions      OT Treatment/Interventions: Self-care/ADL training;Therapeutic exercise;Energy conservation;DME and/or AE instruction;Therapeutic activities;Patient/family education    OT Goals(Current goals can be found in the care plan section) Acute Rehab OT Goals Patient Stated Goal: Get back to normal OT Goal Formulation: With patient/family Time  For Goal Achievement: 12/10/21 Potential to Achieve Goals: Good  OT Frequency: Min 2X/week   Barriers to D/C:            Co-evaluation  AM-PAC OT "6 Clicks" Daily Activity     Outcome Measure Help from another person eating meals?: Total Help from another person taking care of personal grooming?: A Lot Help from another person toileting, which includes using toliet, bedpan, or urinal?: A Lot Help from another person bathing (including washing, rinsing, drying)?: A Lot Help from another person to put on and taking off regular upper body clothing?: A Lot Help from another person to put on and taking off regular lower body clothing?: A Lot 6 Click Score: 11   End of Session Equipment Utilized During Treatment: Gait belt Nurse Communication: Mobility status;Other (comment) (BP)  Activity Tolerance: Patient tolerated treatment well Patient left: in chair;with call bell/phone within reach;with chair alarm set;with family/visitor present  OT Visit Diagnosis: Unsteadiness on feet (R26.81);Other abnormalities of gait and mobility (R26.89);Muscle weakness (generalized) (M62.81);History of falling (Z91.81)                Time: 0930-1009 OT Time Calculation (min): 39 min Charges:  OT General Charges $OT Visit: 1 Visit OT Evaluation $OT Eval Moderate Complexity: 1 Mod OT Treatments $Self Care/Home Management : 8-22 mins  Valla Pacey MSOT, OTR/L Acute Rehab Pager: [phone] Office: [place] 11/26/2021, 10:31 AM

## 2021-11-26 NOTE — Progress Notes (Signed)
Inpatient Rehab Admissions Coordinator:   Per therapy recommendations,  patient was screened for CIR candidacy by Clemens Catholic, MS, CCC-SLP . At this time, Pt. Appears to demonstrate medical necessity, functional decline, and ability to tolerate intensity of CIR. Pt. is a potential candidate for CIR. I will place   order for rehab consult per protocol for full assessment. Please contact me any with questions.Clemens Catholic, [place], [place] Admissions Coordinator  [phone] ([place]) [phone] (office)

## 2021-11-26 NOTE — Progress Notes (Signed)
OT Cancellation Note  Patient Details Name: [name] MRN: [id number] DOB: 12-19-1945   Cancelled Treatment:    Reason Eval/Treat Not Completed:  (strict bedrest) Will return as schedule allows and pt medically ready. Thank you.  [place], OTR/L Acute Rehab Pager: [phone] Office: [phone] 11/26/2021, 7:14 AM

## 2021-11-26 NOTE — TOC Initial Note (Signed)
Transition of Care [facility]) - Initial/Assessment Note    Patient Details  Name: [name] MRN: [id number] Date of Birth: 05/23/1946  Transition of Care [facility]) CM/SW Contact:    Ella Bodo, RN Phone Number: 11/26/2021, 12:00pm  Clinical Narrative:                 75 yo male presenting to ED on 12/11 with ataxia and vertigo. 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 Prior to admission, patient independent and living at home with spouse.  Patient currently in MRI; met with patient's wife at bedside.  She states that they are from the [place] area, and would prefer [facility] rehab if possible.  Left message for admissions liaison with [facility] rehab regarding referral.  Will follow.   Expected Discharge Plan: IP Rehab Facility Barriers to Discharge: Continued Medical Work up   Patient Goals and CMS Choice   CMS Medicare.gov Compare Post Acute Care list provided to:: Patient Represenative (must comment) (wife) Choice offered to / list presented to : Spouse, Patient  Expected Discharge Plan and Services Expected Discharge Plan: [place]   Discharge Planning Services: CM Consult Post Acute Care Choice: IP Rehab Living arrangements for the past 2 months: Single Family Home                                      Prior Living Arrangements/Services Living arrangements for the past 2 months: Single Family Home Lives with:: Spouse Patient language and need for interpreter reviewed:: Yes        Need for Family Participation in Patient Care: Yes (Comment) Care giver support system in place?: Yes (comment)   Criminal Activity/Legal Involvement Pertinent to Current Situation/Hospitalization: No - Comment as needed  Activities of Daily Living Home Assistive Devices/Equipment: None ADL Screening (condition at time of admission) Patient's cognitive ability adequate to safely complete daily activities?: Yes Is the patient deaf or  have difficulty hearing?: No Does the patient have difficulty seeing, even when wearing glasses/contacts?: No Does the patient have difficulty concentrating, remembering, or making decisions?: No Patient able to express need for assistance with ADLs?: No Does the patient have difficulty dressing or bathing?: No Independently performs ADLs?: Yes (appropriate for developmental age) Does the patient have difficulty walking or climbing stairs?: No Weakness of Legs: None Weakness of Arms/Hands: None  Permission Sought/Granted                  Emotional Assessment              Admission diagnosis:  Stroke [facility]) [I63.9] Acute ischemic stroke [facility]) [I63.9] Cerebellar stroke [facility]) [I63.9] Patient Active Problem List   Diagnosis Date Noted   Stroke ([place]) 11/25/2021   PCP:  Rogelia Rohrer, MD Pharmacy:   [place], [place] - [address], [place] [address] Phone: [phone] Fax: [phone]     Social Determinants of Health (SDOH) Interventions    Readmission Risk Interventions No flowsheet data found.  Reinaldo Raddle, RN, BSN  Trauma/Neuro ICU Case Manager [phone]

## 2021-11-26 NOTE — Progress Notes (Signed)
Pt. With neuro change during Dr. Phoebe Sharps assessment at approximately 1000. Previous NIH score of 2 marked only for ataxia in R arm and R leg. During Dr. Phoebe Sharps assessment pt. Noted to have left gaze preference but would still cross midline. RN raised concern about changes. Standard 24 hr post TNK CT changed to MRI to assess for further infarcts.    11/26/21 1000  Modified NIH Stroke Scale  Interval (S)  Neuro change (Stroke MD at bedside)  Level of Consciousness (1a.)    0  LOC Questions (1b. )   + 0  LOC Commands (1c. )   +  0  Best Gaze (2. )  + 1  Visual (3. )  + 2  Facial Palsy (4. )     0  Motor Arm, Left (5a. )   + 0  Motor Arm, Right (5b. )   + 0  Motor Leg, Left (6a. )   + 0  Motor Leg, Right (6b. )   + 0  Limb Ataxia (7. ) 1  Sensory (8. )   + 0  Best Language (9. )   + 0  Dysarthria (10. ) 0  Extinction/Inattention (11.)   + 1 (Attending more to left side than earlier)  Complete NIHSS TOTAL 5

## 2021-11-26 NOTE — Anesthesia Preprocedure Evaluation (Addendum)
Anesthesia Evaluation  Patient identified by MRN, date of birth, ID band Patient awake    Reviewed: Allergy & Precautions, NPO status , Patient's Chart, lab work & pertinent test results  Airway Mallampati: III   Neck ROM: Limited    Dental  (+) Teeth Intact, Dental Advisory Given   Pulmonary former smoker,    breath sounds clear to auscultation       Cardiovascular + CAD and + CABG   Rhythm:Regular Rate:Normal  Echo:  1. Left ventricular ejection fraction, by estimation, is 70 to 75%. The  left ventricle has hyperdynamic function. The left ventricle has no  regional wall motion abnormalities. The left ventricular internal cavity  size was mildly dilated. Left  ventricular diastolic parameters are consistent with Grade I diastolic  dysfunction (impaired relaxation). Elevated left atrial pressure.  2. RV is not well seen but overall RVEF is probably normal . The right  ventricular size is normal.  3. The mitral valve is normal in structure. Mild mitral valve  regurgitation.  4. The aortic valve is tricuspid. Aortic valve regurgitation is not  visualized. Aortic valve sclerosis/calcification is present, without any  evidence of aortic stenosis.  5. The inferior vena cava is normal in size with greater than 50%  respiratory variability, suggesting right atrial pressure of 3 mmHg.    Neuro/Psych CVA negative psych ROS   GI/Hepatic negative GI ROS, Neg liver ROS,   Endo/Other  negative endocrine ROS  Renal/GU negative Renal ROS     Musculoskeletal negative musculoskeletal ROS (+)   Abdominal Normal abdominal exam  (+)   Peds  Hematology negative hematology ROS (+)   Anesthesia Other Findings   Reproductive/Obstetrics                            Anesthesia Physical Anesthesia Plan  ASA: 3 and emergent  Anesthesia Plan: General   Post-op Pain Management:    Induction: Intravenous,  Rapid sequence and Cricoid pressure planned  PONV Risk Score and Plan: 3 and Ondansetron, Dexamethasone and Midazolam  Airway Management Planned: Oral ETT  Additional Equipment: None  Intra-op Plan:   Post-operative Plan: Possible Post-op intubation/ventilation  Informed Consent: I have reviewed the patients History and Physical, chart, labs and discussed the procedure including the risks, benefits and alternatives for the proposed anesthesia with the patient or authorized representative who has indicated his/her understanding and acceptance.     Dental advisory given  Plan Discussed with: CRNA  Anesthesia Plan Comments:        Anesthesia Quick Evaluation

## 2021-11-26 NOTE — Plan of Care (Addendum)
Pt MRI repeat showed large right and small left cerebellar infarcts, however, will severe edema and mass effect on the pontine, upper medullar, fourth ventricle. No hydrocephalus. This explains pt right gaze palsy found this am during round. Given the dramatic change within 24h of the two MRIs, will start 3% saline for cerebral edema treatment. No central line at this time, will do 3% saline 250cc bolus followed by 3% @ 75cc.  Around 6pm, stroke RN reported that pt sleepy and newly developed dysarthria and aphasia. NIHSS from 2 to 8. Still able to arouse and following simple commands. Will do stat CT head. Also discussed with Dr. Zada Finders, may need to consider suboccpital decompression with or without EVD based on CT findings.   Rosalin Hawking, MD PhD Stroke Neurology 11/26/2021 6:26 PM  ADDENDUM: Pt wife at bedside, pt mildly sleepy but arousable easily and complained of HA, b/l frontal, can not tell me whether sharp or dull. Also complained of b/l leg soreness. Moving all extremities equally. Still has right gaze deficit. Dr. Zada Finders is in [place] now and he will look at the CT for further decision. Updated wife.   Rosalin Hawking, MD PhD Stroke Neurology 11/26/2021 7:30 PM

## 2021-11-26 NOTE — Evaluation (Signed)
Clinical/Bedside Swallow Evaluation Patient Details  Name: [name] MRN: [id number] Date of Birth: September 25, 1946  Today's Date: 11/26/2021 Time: SLP Start Time (ACUTE ONLY): 14 SLP Stop Time (ACUTE ONLY): 1100 SLP Time Calculation (min) (ACUTE ONLY): 30 min  Past Medical History:  Past Medical History:  Diagnosis Date   CAD (coronary artery disease)    a. s/p CABG in 2012   Hyperlipidemia    Past Surgical History:  Past Surgical History:  Procedure Laterality Date   CORONARY ARTERY BYPASS GRAFT  2012   HPI:  75 yo male with stroke risk factors of CAD and HLD. Stat MRI showed bilateral cerebellar infarcts and pt noted to have developed ataxia. Patient was given TNK as he was still in the window. On repeat exam, prior to TNK, his NIHSS was 2 for right sided ataxia. Pt has suffered from nausea and vomiting impacting success with [facility]    Assessment / Plan / Recommendation  Clinical Impression  Pt demonstrates coughing after sips of thin liquids. Given ataxic dysarthria and appearance of intermittent coughing suspect timing impairment with swallow response. Attempted to cue pt in smaller sips and oral holding. Pt attempted but struggled to complete. Cognitive impairment noted and pt may struggle to follow precautions independently at least initially. Trialed nectar thick liquids with immediate resolution of coughing. Pt able to self feed puree and masticate solids. Recommend regular diet and nectar thick liquids. Will f/u for resolution vs need for instrumental assessment. SLP Visit Diagnosis: Dysphagia, unspecified (R13.10)    Aspiration Risk  Mild aspiration risk    Diet Recommendation Regular;Nectar-thick liquid   Liquid Administration via: Cup;Straw Medication Administration: Whole meds with liquid Supervision: Patient able to self feed Compensations: Slow rate;Small sips/bites Postural Changes: Seated upright at 90 degrees    Other  Recommendations Oral Care  Recommendations: Oral care BID    Recommendations for follow up therapy are one component of a multi-disciplinary discharge planning process, led by the attending physician.  Recommendations may be updated based on patient status, additional functional criteria and insurance authorization.  Follow up Recommendations Acute inpatient rehab (3hours/day)      Assistance Recommended at Discharge Frequent or constant Supervision/Assistance  Functional Status Assessment Patient has had a recent decline in their functional status and demonstrates the ability to make significant improvements in function in a reasonable and predictable amount of time.  Frequency and Duration min 2x/week  2 weeks       Prognosis        Swallow Study   General HPI: 75 yo male with stroke risk factors of CAD and HLD. Stat MRI showed bilateral cerebellar infarcts and pt noted to have developed ataxia. Patient was given TNK as he was still in the window. On repeat exam, prior to TNK, his NIHSS was 2 for right sided ataxia. Pt has suffered from nausea and vomiting impacting success with [facility] Type of Study: Bedside Swallow Evaluation Previous Swallow Assessment: none Diet Prior to this Study: NPO Temperature Spikes Noted: No Respiratory Status: Room air History of Recent Intubation: No Behavior/Cognition: Alert;Cooperative;Pleasant mood Oral Cavity Assessment: Within Functional Limits Oral Care Completed by SLP: No Oral Cavity - Dentition: Adequate natural dentition Vision: Functional for self-feeding Self-Feeding Abilities: Needs assist Patient Positioning: Upright in chair Baseline Vocal Quality: Normal Volitional Cough: Strong Volitional Swallow: Able to elicit    Oral/Motor/Sensory Function Overall Oral Motor/Sensory Function: Other (comment) (ataxic movement)   Ice Chips     Thin Liquid Thin Liquid: Impaired Presentation: Self Fed;Straw;Cup  Pharyngeal  Phase Impairments: Cough - Immediate    Nectar  Thick Nectar Thick Liquid: Within functional limits Presentation: Straw;Self Fed   Honey Thick Honey Thick Liquid: Not tested   Puree Puree: Within functional limits Presentation: Spoon;Self Fed   Solid     Solid: Within functional limits Presentation: Self Fed      Aashka Salomone, Katherene Ponto 11/26/2021,1:00 PM

## 2021-11-27 ENCOUNTER — Inpatient Hospital Stay (HOSPITAL_COMMUNITY): Payer: Medicare Other

## 2021-11-27 ENCOUNTER — Encounter (HOSPITAL_COMMUNITY): Payer: Self-pay | Admitting: Neurological Surgery

## 2021-11-27 DIAGNOSIS — G936 Cerebral edema: Secondary | ICD-10-CM

## 2021-11-27 DIAGNOSIS — Z9889 Other specified postprocedural states: Secondary | ICD-10-CM

## 2021-11-27 DIAGNOSIS — I639 Cerebral infarction, unspecified: Secondary | ICD-10-CM

## 2021-11-27 DIAGNOSIS — I63443 Cerebral infarction due to embolism of bilateral cerebellar arteries: Secondary | ICD-10-CM | POA: Diagnosis not present

## 2021-11-27 DIAGNOSIS — J690 Pneumonitis due to inhalation of food and vomit: Secondary | ICD-10-CM

## 2021-11-27 DIAGNOSIS — I4892 Unspecified atrial flutter: Secondary | ICD-10-CM | POA: Diagnosis not present

## 2021-11-27 LAB — BASIC METABOLIC PANEL
Anion gap: 8 (ref 5–15)
BUN: 17 mg/dL (ref 8–23)
CO2: 22 mmol/L (ref 22–32)
Calcium: 7.8 mg/dL — ABNORMAL LOW (ref 8.9–10.3)
Chloride: 112 mmol/L — ABNORMAL HIGH (ref 98–111)
Creatinine, Ser: 0.89 mg/dL (ref 0.61–1.24)
GFR, Estimated: >60 mL/min (ref >60)
Glucose, Bld: 131 mg/dL — ABNORMAL HIGH (ref 70–99)
Potassium: 3.7 mmol/L (ref 3.5–5.1)
Sodium: 142 mmol/L (ref 135–145)

## 2021-11-27 LAB — POCT I-STAT 7, (LYTES, BLD GAS, ICA,H+H)
Acid-base deficit: 1 mmol/L (ref 0.0–2.0)
Bicarbonate: 23.6 mmol/L (ref 20.0–28.0)
Calcium, Ion: 1.16 mmol/L (ref 1.15–1.40)
HCT: 42 % (ref 39.0–52.0)
Hemoglobin: 14.3 g/dL (ref 13.0–17.0)
O2 Saturation: 100 %
Patient temperature: 98.2
Potassium: 3.8 mmol/L (ref 3.5–5.1)
Sodium: 144 mmol/L (ref 135–145)
TCO2: 25 mmol/L (ref 22–32)
pCO2 arterial: 38.7 mmHg (ref 32.0–48.0)
pH, Arterial: 7.391 (ref 7.350–7.450)
pO2, Arterial: 212 mmHg — ABNORMAL HIGH (ref 83.0–108.0)

## 2021-11-27 LAB — CBC
HCT: 43 % (ref 39.0–52.0)
Hemoglobin: 14.3 g/dL (ref 13.0–17.0)
MCH: 30.6 pg (ref 26.0–34.0)
MCHC: 33.3 g/dL (ref 30.0–36.0)
MCV: 92.1 fL (ref 80.0–100.0)
Platelets: 217 10*3/uL (ref 150–400)
RBC: 4.67 MIL/uL (ref 4.22–5.81)
RDW: 14.9 % (ref 11.5–15.5)
WBC: 12.1 10*3/uL — ABNORMAL HIGH (ref 4.0–10.5)
nRBC: 0 % (ref 0.0–0.2)

## 2021-11-27 LAB — SODIUM
Sodium: 145 mmol/L (ref 135–145)
Sodium: 147 mmol/L — ABNORMAL HIGH (ref 135–145)

## 2021-11-27 IMAGING — DX DG CHEST 1V PORT
1 series · 1 of 1 positions shown · non-contrast
Comparison: None.

CLINICAL DATA: Check intubation status.

EXAM:
PORTABLE CHEST 1 VIEW

[chest]
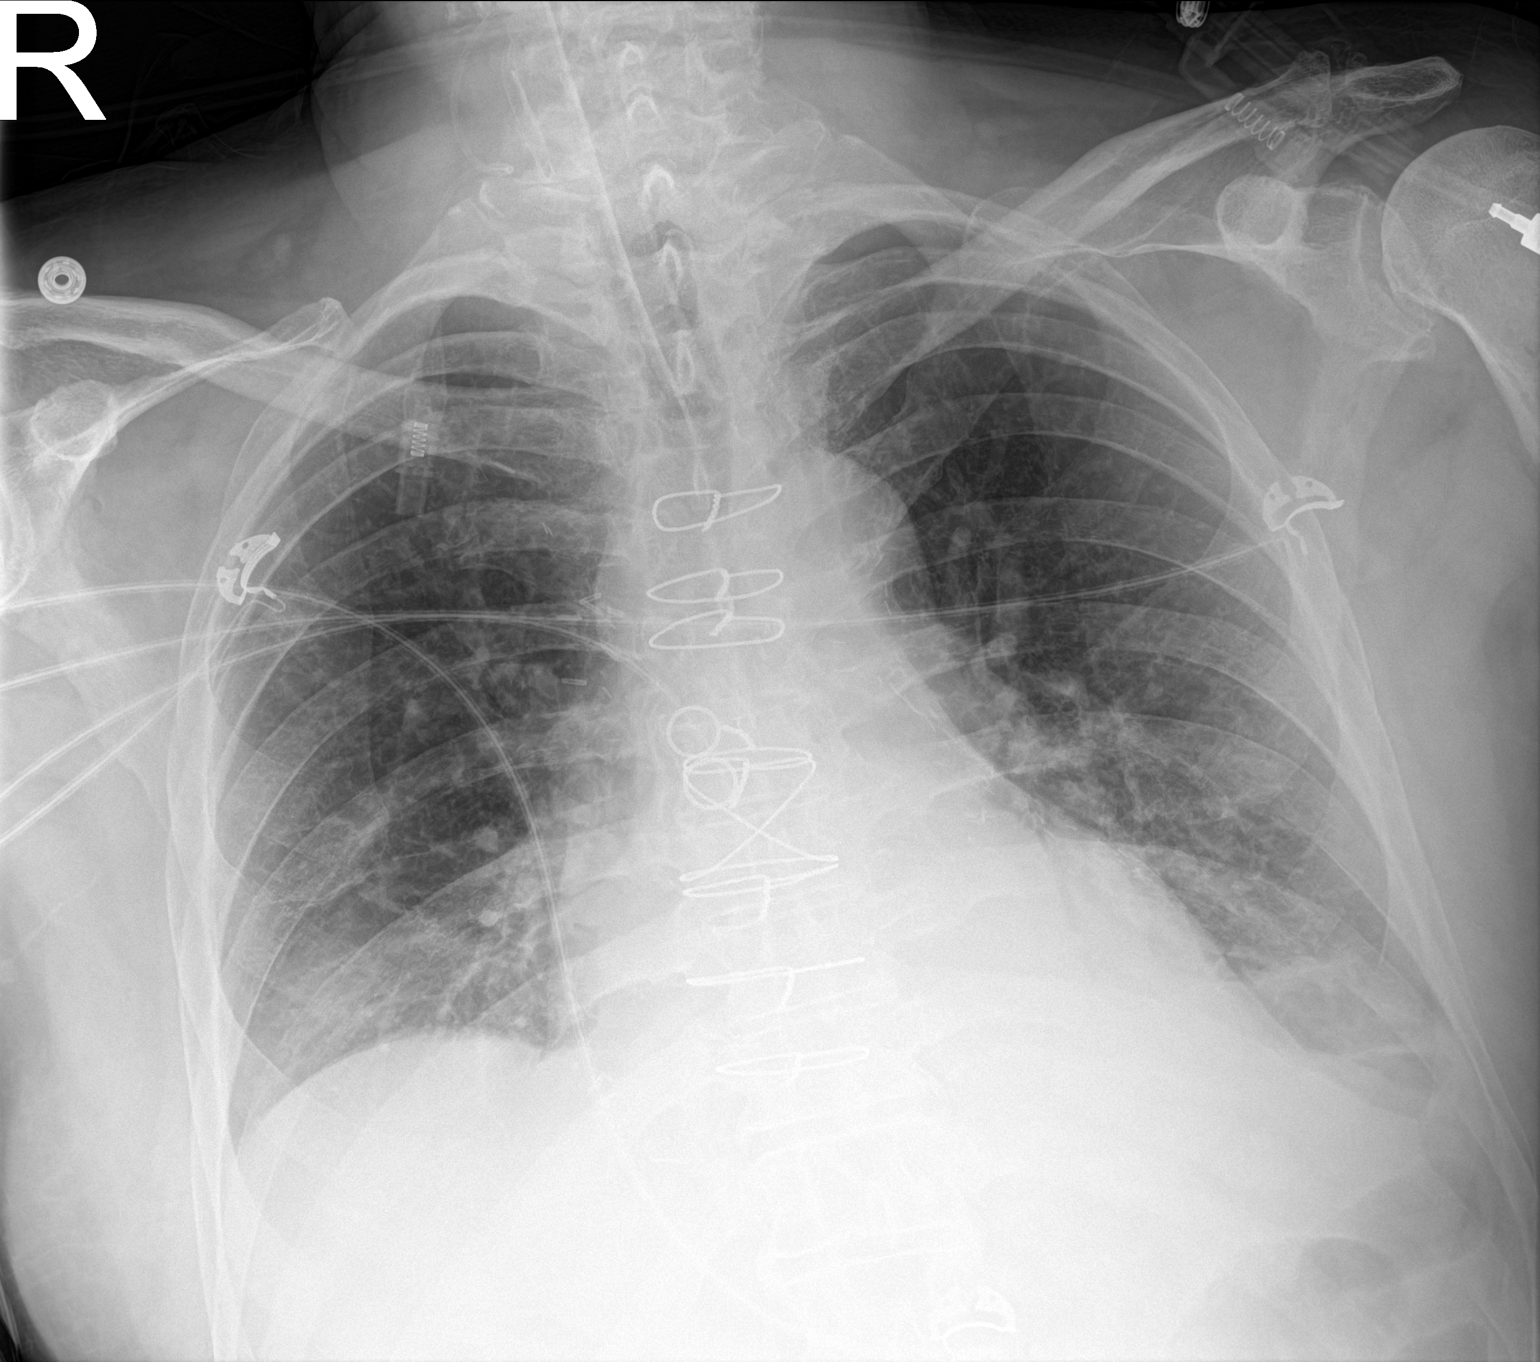

[1 of 1 positions shown; findings below may reference images not displayed]

FINDINGS: Endotracheal tube is in place with the tip 6 cm from the carina.
There are intact sternotomy sutures with CABG changes. On the left
there is increased opacity in the lower lobe distribution consistent
with pneumonia or aspiration with small left pleural effusion
consistent with a parapneumonic pleural effusion.

The remaining lungs are generally clear. The right sulci are sharp.
There is thoracic spondylosis.
IMPRESSION: 1. ETT tip 6 cm from the carina.
2. Left lower lobe consolidation with small adjacent left pleural
effusion. Findings are likely either due to pneumonia or aspiration.
3. Clinical correlation and radiographic follow-up recommended.

## 2021-11-27 IMAGING — CT CT HEAD W/O CM
4 series · 17 of 47 positions shown, 19 images · non-contrast
Comparison: [DATE]

CLINICAL DATA: Stroke, follow-up suboccipital decompression

EXAM:
CT HEAD WITHOUT CONTRAST
TECHNIQUE: Contiguous axial images were obtained from the base of the skull
through the vertex without intravenous contrast.

[Series 3: head wo · axial · 0.48mm/px · z∈[+1251,+1391]mm · 7 of 38 slices shown, 9 images]
[im 5/38  brain]
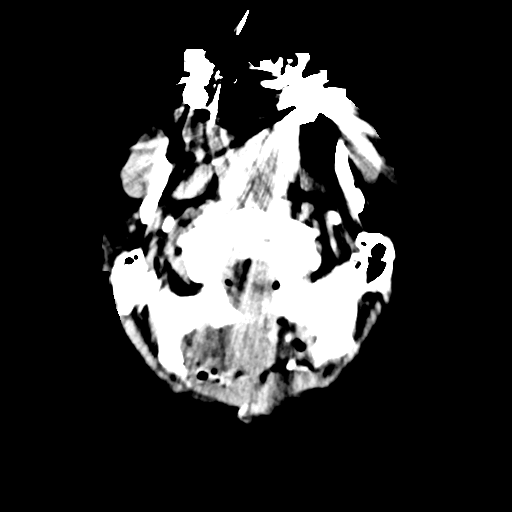
[im 5/38  bone]
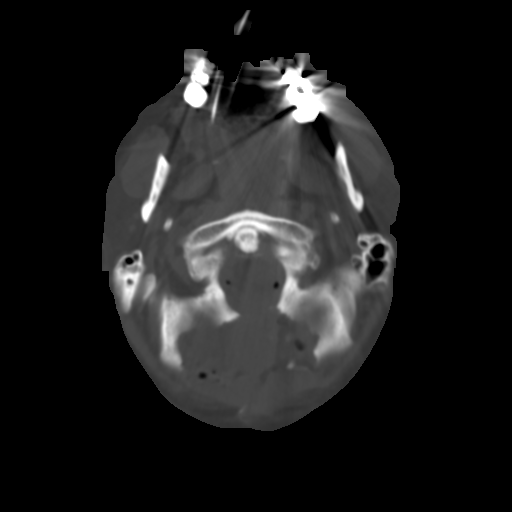
[im 10/38  brain]
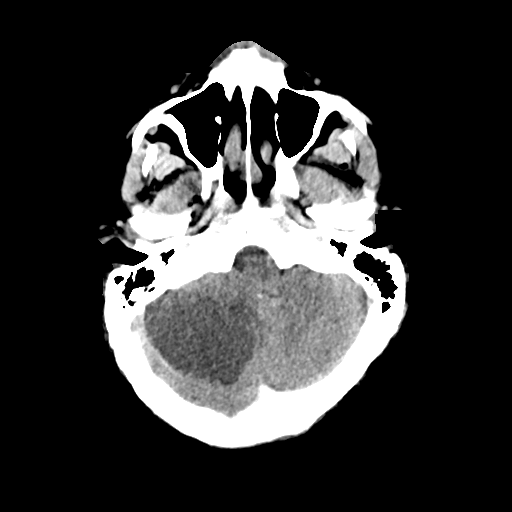
[im 14/38  brain]
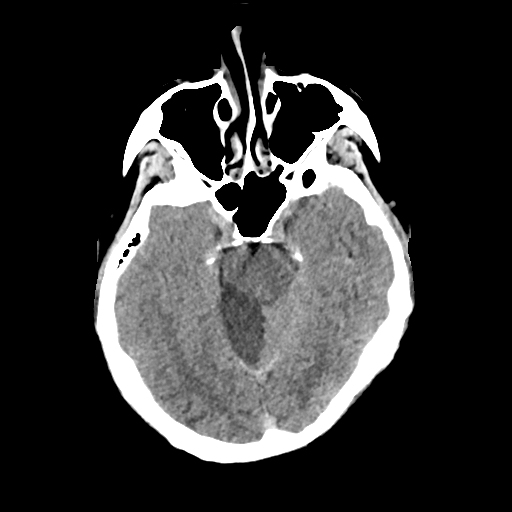
[im 19/38  brain]
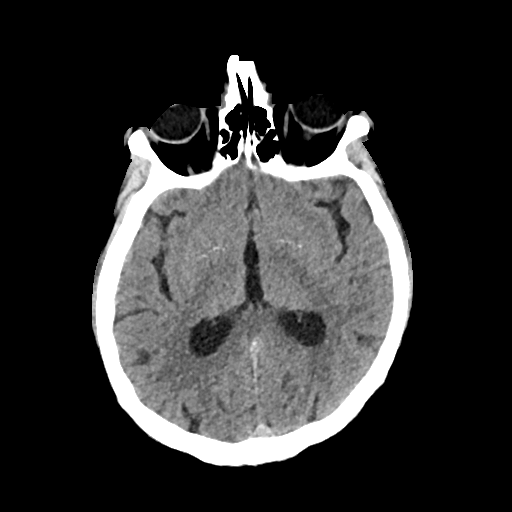
[im 24/38  brain]
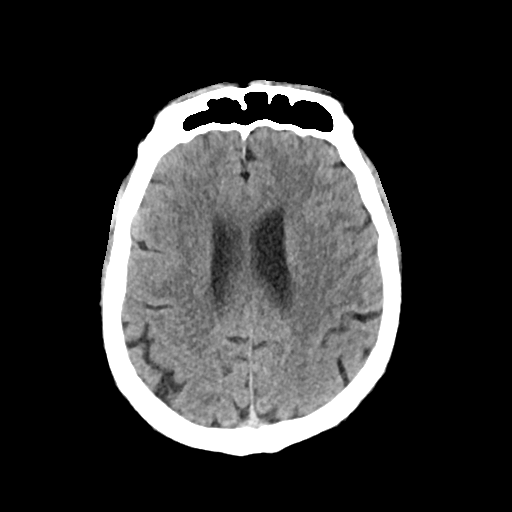
[im 24/38  bone]
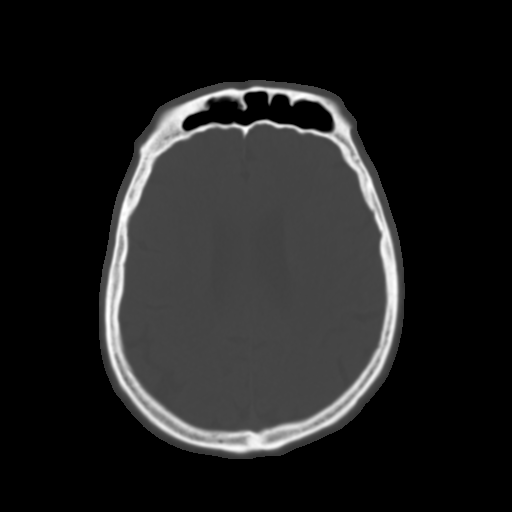
[im 28/38  brain]
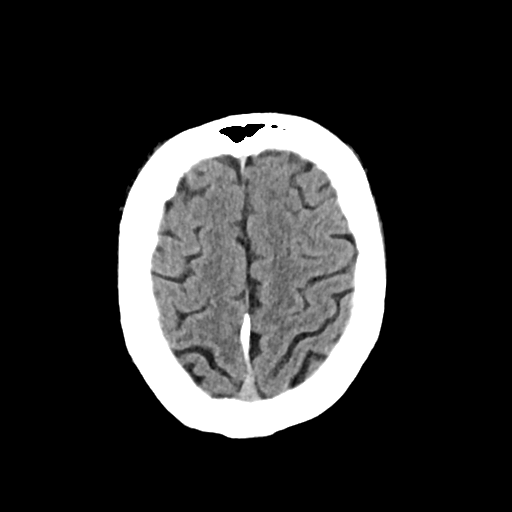
[im 33/38  brain]
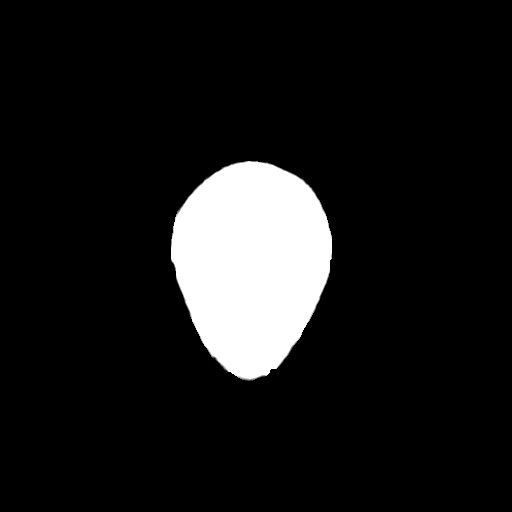

[Series 4: head bone · axial · 0.48mm/px · z∈[+1249,+1313]mm · 4 of 94 slices shown]
[im 10/94  bone]
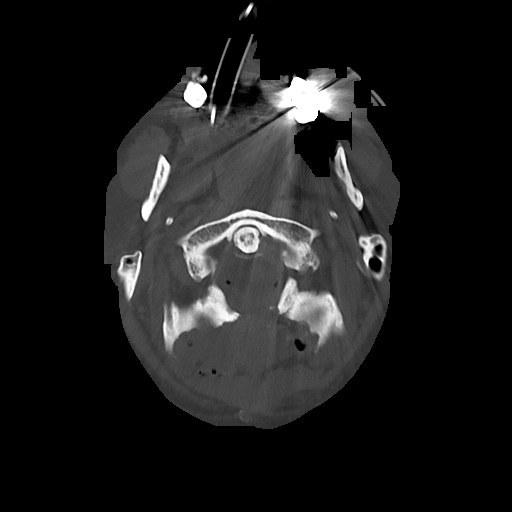
[im 19/94  bone]
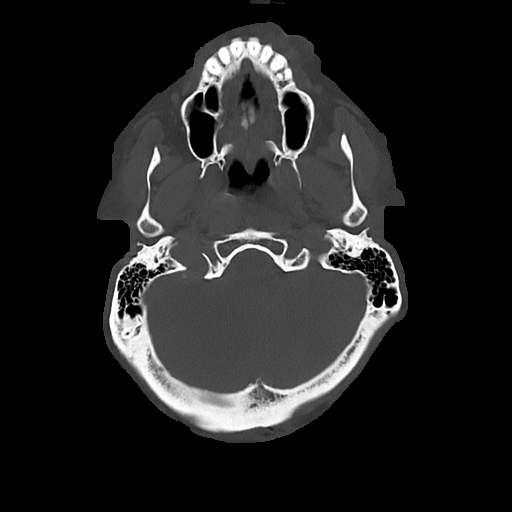
[im 28/94  bone]
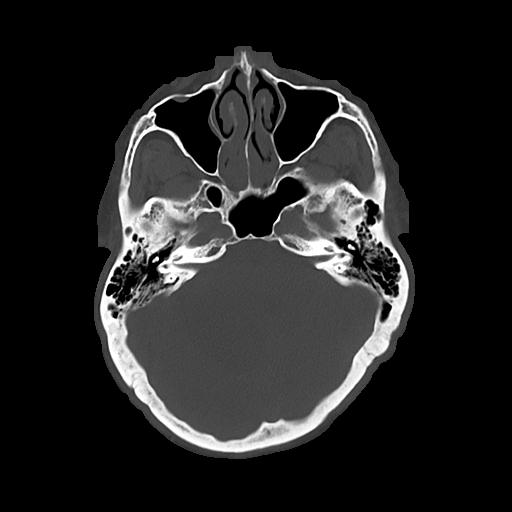
[im 42/94  bone]
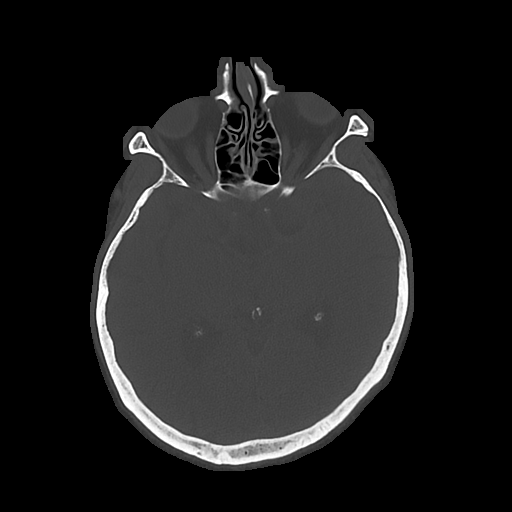

[Series 5: cor soft · coronal · 0.38mm/px · 3 of 72 slices shown]
[im 26/72  brain]
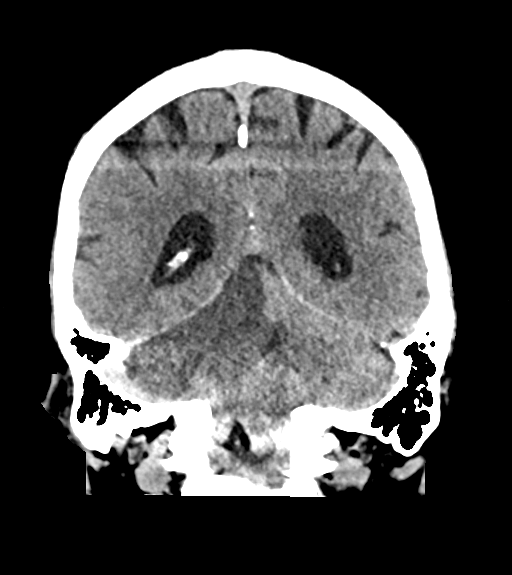
[im 33/72  brain]
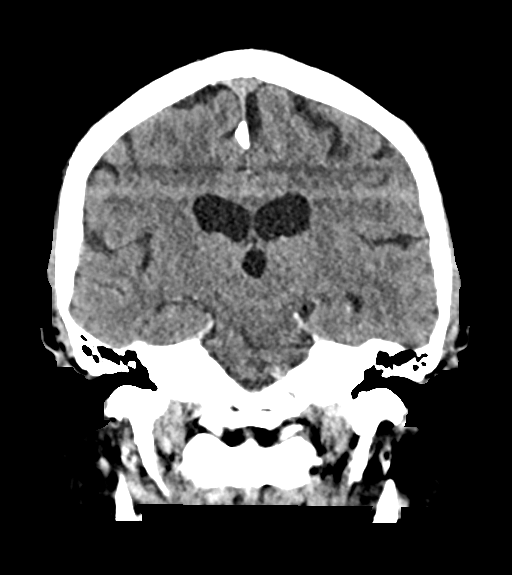
[im 39/72  brain]
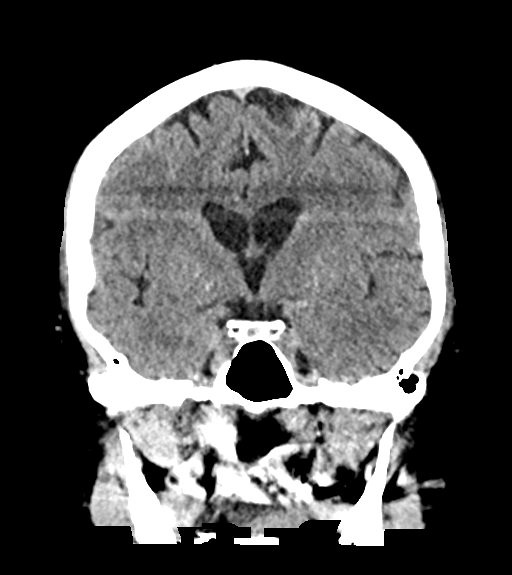

[Series 6: sag soft · sagittal · 0.45mm/px · 3 of 65 slices shown]
[im 22/65  brain]
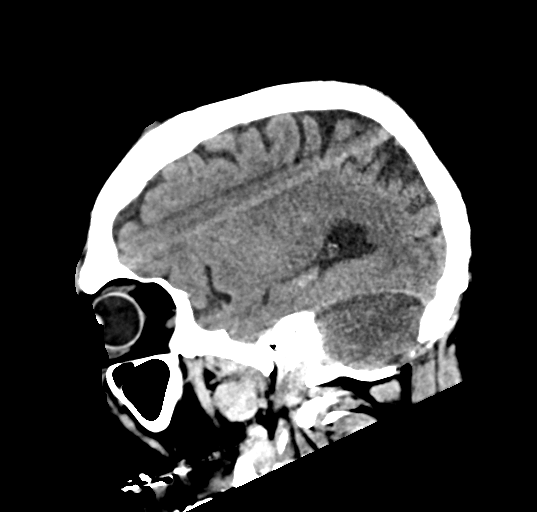
[im 33/65  brain]
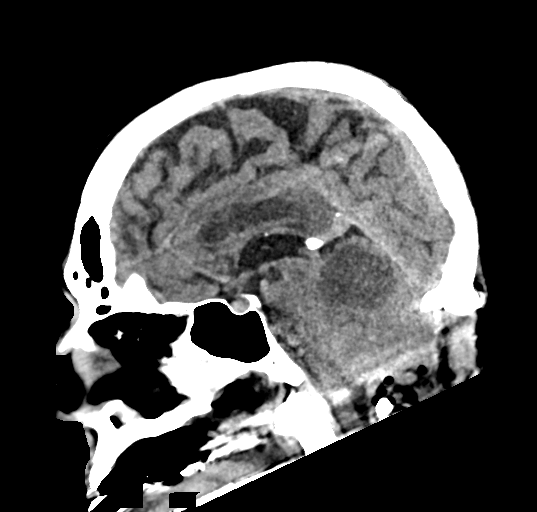
[im 43/65  brain]
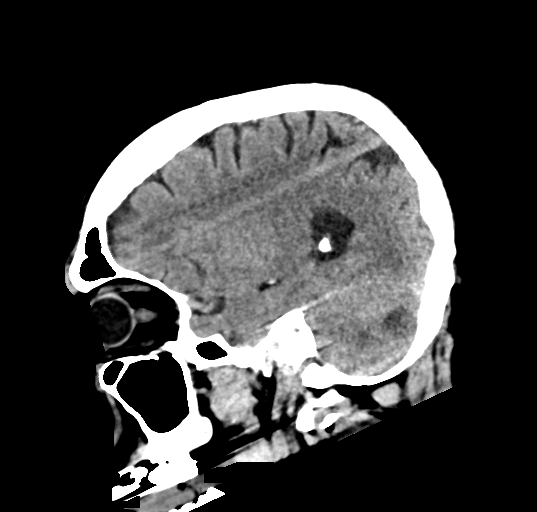

[17 of 47 positions shown; findings below may reference images not displayed]

FINDINGS: Brain: Status post interval suboccipital decompression, with
pneumocephalus in the posterior fossa, not unexpected
postoperatively. Redemonstrated hypodensity in the
right-greater-than-left cerebellum, not significantly changed in
extent since the prior exam. Persistent edema and mass effect on the
fourth ventricle. Unchanged size and configuration of the lateral
and third ventricles. No new area of infarction identified. No acute
hemorrhage.

Vascular: No hyperdense vessel.

Skull: Status post suboccipital craniectomy.  No acute fracture.

Sinuses/Orbits: Mucosal thickening in the ethmoid air cells. The
orbits are unremarkable.

Other: Trace fluid in right mastoid air cells. The patient is
intubated.
IMPRESSION: Status post interval suboccipital decompression, with unchanged
right greater than left cerebellar infarcts and no new infarction.
Unchanged narrowing of the fourth ventricle, without evidence of
ventricular obstruction or hydrocephalus.

## 2021-11-27 MED ORDER — EPHEDRINE 5 MG/ML INJ
INTRAVENOUS | Status: AC
  Filled 2021-11-27: qty 5

## 2021-11-27 MED ORDER — PHENYLEPHRINE 40 MCG/ML (10ML) SYRINGE FOR IV PUSH (FOR BLOOD PRESSURE SUPPORT)
PREFILLED_SYRINGE | INTRAVENOUS | Status: DC | PRN
  Administered 2021-11-27: 40 ug via INTRAVENOUS

## 2021-11-27 MED ORDER — BUPIVACAINE HCL (PF) 0.5 % IJ SOLN
INTRAMUSCULAR | Status: DC | PRN
  Administered 2021-11-27: 5 mL

## 2021-11-27 MED ORDER — SODIUM CHLORIDE 3 % IV BOLUS
250.0000 mL | Freq: Once | INTRAVENOUS | Status: AC
  Administered 2021-11-27: 250 mL via INTRAVENOUS
  Filled 2021-11-27: qty 500

## 2021-11-27 MED ORDER — CEFAZOLIN SODIUM-DEXTROSE 2-3 GM-%(50ML) IV SOLR
INTRAVENOUS | Status: DC | PRN
  Administered 2021-11-27: 2 g via INTRAVENOUS

## 2021-11-27 MED ORDER — PHENYLEPHRINE 40 MCG/ML (10ML) SYRINGE FOR IV PUSH (FOR BLOOD PRESSURE SUPPORT)
PREFILLED_SYRINGE | INTRAVENOUS | Status: AC
  Filled 2021-11-27: qty 10

## 2021-11-27 MED ORDER — LOSARTAN POTASSIUM 50 MG PO TABS
50.0000 mg | ORAL_TABLET | Freq: Every day | ORAL | Status: DC
  Administered 2021-11-27: 50 mg via ORAL
  Filled 2021-11-27: qty 1

## 2021-11-27 MED ORDER — FENTANYL CITRATE PF 50 MCG/ML IJ SOSY
25.0000 ug | PREFILLED_SYRINGE | INTRAMUSCULAR | Status: DC | PRN
Start: 2021-11-27 — End: 2021-11-28
  Administered 2021-11-27: 100 ug via INTRAVENOUS
  Administered 2021-11-27 (×2): 50 ug via INTRAVENOUS
  Administered 2021-11-27: 20:00:00 100 ug via INTRAVENOUS
  Administered 2021-11-27: 50 ug via INTRAVENOUS
  Administered 2021-11-28: 08:00:00 25 ug via INTRAVENOUS
  Filled 2021-11-27 (×2): qty 1
  Filled 2021-11-27 (×2): qty 2
  Filled 2021-11-27 (×2): qty 1

## 2021-11-27 MED ORDER — ORAL CARE MOUTH RINSE
15.0000 mL | OROMUCOSAL | Status: DC
  Administered 2021-11-27 (×5): 15 mL via OROMUCOSAL

## 2021-11-27 MED ORDER — SODIUM CHLORIDE 0.9 % IV SOLN: INTRAVENOUS | Status: DC | PRN

## 2021-11-27 MED ORDER — PROPOFOL 1000 MG/100ML IV EMUL
5.0000 ug/kg/min | INTRAVENOUS | Status: DC
  Administered 2021-11-27: 50 ug/kg/min via INTRAVENOUS
  Filled 2021-11-27 (×2): qty 100

## 2021-11-27 MED ORDER — LIDOCAINE-EPINEPHRINE 2 %-1:100000 IJ SOLN
INTRAMUSCULAR | Status: DC | PRN
  Administered 2021-11-27: 5 mL via INTRADERMAL

## 2021-11-27 MED ORDER — SODIUM CHLORIDE 0.9 % IV SOLN
3.0000 g | Freq: Four times a day (QID) | INTRAVENOUS | Status: AC
  Administered 2021-11-27 – 2021-12-03 (×27): 3 g via INTRAVENOUS
  Filled 2021-11-27 (×27): qty 8

## 2021-11-27 MED ORDER — CEFAZOLIN SODIUM 1 G IJ SOLR
INTRAMUSCULAR | Status: AC
  Filled 2021-11-27: qty 20

## 2021-11-27 MED ORDER — BACITRACIN 500 UNIT/GM EX OINT
TOPICAL_OINTMENT | CUTANEOUS | Status: DC | PRN
  Administered 2021-11-27: 1 via TOPICAL

## 2021-11-27 MED ORDER — SUCCINYLCHOLINE CHLORIDE 200 MG/10ML IV SOSY
PREFILLED_SYRINGE | INTRAVENOUS | Status: AC
  Filled 2021-11-27: qty 20

## 2021-11-27 MED ORDER — ROCURONIUM BROMIDE 100 MG/10ML IV SOLN
INTRAVENOUS | Status: DC | PRN
  Administered 2021-11-27 (×2): 50 mg via INTRAVENOUS

## 2021-11-27 MED ORDER — PHENYLEPHRINE HCL-NACL 20-0.9 MG/250ML-% IV SOLN
INTRAVENOUS | Status: DC | PRN
  Administered 2021-11-27: 30 ug/min via INTRAVENOUS

## 2021-11-27 MED ORDER — ROCURONIUM BROMIDE 10 MG/ML (PF) SYRINGE
PREFILLED_SYRINGE | INTRAVENOUS | Status: AC
  Filled 2021-11-27: qty 30

## 2021-11-27 MED ORDER — FENTANYL CITRATE (PF) 250 MCG/5ML IJ SOLN
INTRAMUSCULAR | Status: DC | PRN
  Administered 2021-11-26 – 2021-11-27 (×3): 50 ug via INTRAVENOUS
  Administered 2021-11-27: 100 ug via INTRAVENOUS

## 2021-11-27 MED ORDER — PROPOFOL 10 MG/ML IV BOLUS
INTRAVENOUS | Status: DC | PRN
  Administered 2021-11-26: 160 mg via INTRAVENOUS
  Administered 2021-11-27: 40 mg via INTRAVENOUS

## 2021-11-27 MED ORDER — LIDOCAINE 2% (20 MG/ML) 5 ML SYRINGE
INTRAMUSCULAR | Status: AC
  Filled 2021-11-27: qty 10

## 2021-11-27 MED ORDER — THROMBIN 5000 UNITS EX SOLR
OROMUCOSAL | Status: DC | PRN
  Administered 2021-11-27: 5 mL via TOPICAL

## 2021-11-27 MED ORDER — LIDOCAINE HCL (CARDIAC) PF 100 MG/5ML IV SOSY
PREFILLED_SYRINGE | INTRAVENOUS | Status: DC | PRN
  Administered 2021-11-26: 60 mg via INTRATRACHEAL

## 2021-11-27 MED ORDER — DEXMEDETOMIDINE HCL IN NACL 400 MCG/100ML IV SOLN
0.2000 ug/kg/h | INTRAVENOUS | Status: DC
  Administered 2021-11-27 – 2021-11-28 (×2): 0.4 ug/kg/h via INTRAVENOUS
  Administered 2021-11-28: 23:00:00 0.7 ug/kg/h via INTRAVENOUS
  Administered 2021-11-28 (×2): 0.6 ug/kg/h via INTRAVENOUS
  Administered 2021-11-29: 06:00:00 0.9 ug/kg/h via INTRAVENOUS
  Administered 2021-11-29: 1.1 ug/kg/h via INTRAVENOUS
  Administered 2021-11-29: 1.2 ug/kg/h via INTRAVENOUS
  Administered 2021-11-29: 15:00:00 0.9 ug/kg/h via INTRAVENOUS
  Administered 2021-11-29: 1 ug/kg/h via INTRAVENOUS
  Administered 2021-11-30 (×3): 1.2 ug/kg/h via INTRAVENOUS
  Administered 2021-11-30 – 2021-12-01 (×2): 0.8 ug/kg/h via INTRAVENOUS
  Administered 2021-12-01 – 2021-12-02 (×3): 0.7 ug/kg/h via INTRAVENOUS
  Filled 2021-11-27 (×17): qty 100

## 2021-11-27 MED ORDER — CHLORHEXIDINE GLUCONATE 0.12% ORAL RINSE (MEDLINE KIT)
15.0000 mL | Freq: Two times a day (BID) | OROMUCOSAL | Status: DC
  Administered 2021-11-27 – 2021-12-05 (×16): 15 mL via OROMUCOSAL

## 2021-11-27 MED ORDER — SUCCINYLCHOLINE CHLORIDE 200 MG/10ML IV SOSY
PREFILLED_SYRINGE | INTRAVENOUS | Status: DC | PRN
  Administered 2021-11-26: 120 mg via INTRAVENOUS

## 2021-11-27 NOTE — Progress Notes (Signed)
Neurosurgery Service Post-operative progress note  Assessment & Plan: 75 y.o. man s/p suboccipital craniectomy. Intra-op findings c/w expectations, well decompressed and regained cerebellar pulsatility after decompression.  -leaving intubated overnight post-op, will get a CTH to confirm vents are stable  Judith Part  11/27/21 1:27 AM

## 2021-11-27 NOTE — Consult Note (Signed)
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 Hypertensive on arrival.  Given TNKase. New afib mentioned in admission note as well - converted back to sinus. Developed increasing edema with mass effect on 4th ventricle and low lying cerebellar tonsil.  Increasing drowsiness.   S/p suboccipital craniectomy, remained intubated after surgery.  Pertinent  Medical History  CAD, s/p CABG  HLD  OA,  Nephrolithiasis BPH  Prostate cancer   Significant Hospital Events: Including procedures, antibiotic start and stop dates in addition to other pertinent events     Interim History / Subjective:    Objective   Blood pressure (!) 155/78, pulse (!) 59, temperature 98.2 F (36.8 C), temperature source Axillary, resp. rate 18, height 5\' 10"  (1.778 m), weight 98.1 kg, SpO2 100 %.    Vent Mode: PRVC FiO2 (%):  [50 %-100 %] 50 % Set Rate:  [18 bmp] 18 bmp Vt Set:  [580 mL] 580 mL PEEP:  [5 cmH20] 5 cmH20 Plateau Pressure:  [17 cmH20-18 cmH20] 17 cmH20   Intake/Output Summary (Last 24 hours) at 11/27/2021 0340 Last data filed at 11/27/2021 0200 Gross per 24 hour  Intake 2503.49 ml  Output 1425 ml  Net 1078.49 ml   Filed Weights   11/25/21 1200  Weight: 98.1 kg    Examination: General: nad, intubated, occasional grimace. HENT: ET tube, surgical bandage Lungs: CTAB Cardiovascular: RRR no mgr  Abdomen: NT, ND, NBS Extremities: no edema, no erythema  Neuro: sedated, occasional grimace GU:  Resolved Hospital Problem list    Assessment & Plan:  S/p posterior decompression craniectomy for cerebellar stroke:  Cont mech vent per neurosurgery, overnight.   Supportive care.  Patient remains on hypertonic saline to reduce brain edema.  Cont current vent settings, except ok to titrate down on FIO2.   CXR ordered.    Labs   CBC: Recent Labs  Lab 11/25/21 1020 11/25/21 1029 11/26/21 0512 11/27/21 0309  WBC 6.8  --  13.3*  --   NEUTROABS 3.3  --   --   --   HGB 16.6 16.7 15.6 14.3  HCT 48.7 49.0 45.5 42.0  MCV 90.7  --  89.6  --   PLT 223  --  232  --     Basic Metabolic Panel: Recent Labs  Lab 11/25/21 1020 11/25/21 1029 11/26/21 0512 11/26/21 1624 11/26/21 2207 11/27/21 0309  NA 139 140 138 138 139 144  K 3.8 3.6 3.8  --   --  3.8  CL 105 108 105  --   --   --   CO2 22  --  22  --   --   --   GLUCOSE 134* 135* 117*  --   --   --   BUN 18 21 15   --   --   --   CREATININE 1.02 0.90 0.83  --   --   --   CALCIUM 9.1  --  8.7*  --   --   --    GFR: Estimated Creatinine Clearance: 91.7 mL/min (by C-G formula based on SCr of 0.83 mg/dL). Recent Labs  Lab 11/25/21 1020 11/26/21 0512  WBC 6.8 13.3*  Liver Function Tests: Recent Labs  Lab 11/25/21 1020  AST 31  ALT 29  ALKPHOS 86  BILITOT 1.2  PROT 6.9  ALBUMIN 3.9   No results for input(s): LIPASE, AMYLASE in the last 168 hours. No results for input(s): AMMONIA in the last 168 hours.  ABG    Component Value Date/Time   PHART 7.391 11/27/2021 0309   PCO2ART 38.7 11/27/2021 0309   PO2ART 212 (H) 11/27/2021 0309   HCO3 23.6 11/27/2021 0309   TCO2 25 11/27/2021 0309   ACIDBASEDEF 1.0 11/27/2021 0309   O2SAT 100.0 11/27/2021 0309     Coagulation Profile: Recent Labs  Lab 11/25/21 1020  INR 1.0    Cardiac Enzymes: No results for input(s): CKTOTAL, CKMB, CKMBINDEX, TROPONINI in the last 168 hours.  HbA1C: Hgb A1c MFr Bld  Date/Time Value Ref Range Status  11/26/2021 05:12 AM 5.8 (H) 4.8 - 5.6 % Final    Comment:    (NOTE) Pre diabetes:          5.7%-6.4%  Diabetes:              >6.4%  Glycemic control for   <7.0% adults with diabetes      CBG: Recent Labs  Lab 11/25/21 1022 11/25/21 1025  GLUCAP 132* 141*    Review of Systems:   Unable to assess.   Past Medical History:  He,  has a past medical history of CAD (coronary artery disease) and Hyperlipidemia.   Surgical History:   Past Surgical History:  Procedure Laterality Date   CORONARY ARTERY BYPASS GRAFT  2012     Social History:   reports that he has quit smoking. His smoking use included cigarettes. He has never used smokeless tobacco. He reports current alcohol use of about 3.0 standard drinks per week. He reports that he does not use drugs.   Family History:  His family history includes Cancer in his brother; Coronary artery disease in his brother.   Allergies Not on File   Home Medications  Prior to Admission medications   Medication Sig Start Date End Date Taking? Authorizing Provider  aspirin EC 81 MG tablet Take 81 mg by mouth daily. Swallow whole.   Yes [provider]  atorvastatin (LIPITOR) 80 MG tablet Take 80 mg by mouth daily. 10/01/21  Yes [provider]  HYDROcodone-acetaminophen (NORCO/VICODIN) 5-325 MG tablet Take 1 tablet by mouth daily as needed for pain. 09/12/21  Yes [provider]  Omega-3 Fatty Acids (FISH OIL) 1000 MG CAPS Take 2,000 mg by mouth daily.   Yes [provider]  tamsulosin (FLOMAX) 0.4 MG CAPS capsule Take 0.4 mg by mouth daily.   Yes [provider]  tiZANidine (ZANAFLEX) 2 MG tablet Take 2 mg by mouth 3 (three) times daily.   Yes [provider]     Critical care time: 35 min

## 2021-11-27 NOTE — Progress Notes (Signed)
Pharmacy Antibiotic Note  [name] is a 75 y.o. male admitted on 11/25/2021 presenting as code stroke, intubated, now with concern for aspiration pna.  Pharmacy has been consulted for Unasyn  dosing.  Plan: Unasyn 3g IV every 6 hours Monitor renal function, clinical progression and LOT  Height: 5\' 10"  (177.8 cm) Weight: 98.1 kg (216 lb 4.3 oz) IBW/kg (Calculated) : 73  Temp (24hrs), Avg:99 F (37.2 C), Min:98.2 F (36.8 C), Max:100.1 F (37.8 C)  Recent Labs  Lab 11/25/21 1020 11/25/21 1029 11/26/21 0512 11/27/21 0311  WBC 6.8  --  13.3* 12.1*  CREATININE 1.02 0.90 0.83 0.89    Estimated Creatinine Clearance: 85.5 mL/min (by C-G formula based on SCr of 0.89 mg/dL).    Not on File  Bertis Ruddy, PharmD Clinical Pharmacist ED Pharmacist Phone # [phone] 11/27/2021 8:39 AM

## 2021-11-27 NOTE — TOC Progression Note (Signed)
Transition of Care [facility]) - Progression Note    Patient Details  Name: Deakon Frix MRN: [id number] Date of Birth: Oct 27, 1946  Transition of [facility]) CM/SW Contact  Ella Bodo, RN Phone Number: 11/27/2021, 12:34 PM  Clinical Narrative:    Noted events of last evening.  Faxed referral to [facility], Maryan Char, at [phone].  Aware patient is not medically ready, but will continue to follow/communicate with [facility] for possible admission pending medical stability.    Expected Discharge Plan: IP Rehab Facility Barriers to Discharge: Continued Medical Work up  Expected Discharge Plan and Services Expected Discharge Plan: [place]   Discharge Planning Services: CM Consult Post Acute Care Choice: IP Rehab Living arrangements for the past 2 months: Single Family Home                                       Social Determinants of Health (SDOH) Interventions    Readmission Risk Interventions No flowsheet data found.  Reinaldo Raddle, RN, BSN  Trauma/Neuro ICU Case Manager [phone]

## 2021-11-27 NOTE — Progress Notes (Signed)
NAME:  [name], MRN:  [id number], DOB:  Nov 19, 1946, LOS: 2 ADMISSION DATE:  11/25/2021, CONSULTATION DATE: 11/27/21 REFERRING MD:  Marcelle Overlie, CHIEF COMPLAINT:   stroke symptoms  History of Present Illness:  Mr. 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 Hypertensive on arrival.  Given TNKase. New afib mentioned in admission note as well - converted back to sinus. Developed increasing edema with mass effect on 4th ventricle and low lying cerebellar tonsil.  Increasing drowsiness.   S/p suboccipital craniectomy, remained intubated after surgery.  Pertinent  Medical History  CAD, s/p CABG  HLD  OA,  Nephrolithiasis BPH  Prostate cancer   Significant Hospital Events: Including procedures, antibiotic start and stop dates in addition to other pertinent events     Interim History / Subjective:  Patient became agitated this morning, trying to get out of bed Tolerating spontaneous breathing trial Bedside ultrasonography confirmed bilateral lower lobe infiltrates but no pleural effusion  Objective   Blood pressure 133/64, pulse (!) 59, temperature 100.1 F (37.8 C), temperature source Axillary, resp. rate 18, height 5\' 10"  (1.778 m), weight 98.1 kg, SpO2 99 %.    Vent Mode: PRVC FiO2 (%):  [50 %-100 %] 50 % Set Rate:  [18 bmp] 18 bmp Vt Set:  [580 mL] 580 mL PEEP:  [5 cmH20] 5 cmH20 Plateau Pressure:  [17 cmH20-18 cmH20] 17 cmH20   Intake/Output Summary (Last 24 hours) at 11/27/2021 0837 Last data filed at 11/27/2021 0600 Gross per 24 hour  Intake 2577.49 ml  Output 1300 ml  Net 1277.49 ml   Filed Weights   11/25/21 1200  Weight: 98.1 kg    Examination: General: Crtitically ill-appearing male, orally intubated HEENT: [place]/AT, eyes anicteric.  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 and compression of ventral pons s/p posterior decompression craniectomy  Acute hypoxic respiratory failure  Probable aspiration pneumonia  Chronic HFpEF Hypertension  Neurosurgery and neurology is following Continue secondary stroke prophylaxis Patient is on 3% saline, closely monitor serum sodium Continue lung protective ventilation Patient is tolerating spontaneous breathing trial We will try to extubate him today Stop sedation Bedside POCUS showed bilateral lower lobe infiltrates, cardiac systolic function looks optimal We will send respiratory culture Started on Unasyn Monitor intake and output Continue clevidipine Will resume oral antihypertensive after extubation    Labs   CBC: Recent Labs  Lab 11/25/21 1020 11/25/21 1029 11/26/21 0512 11/27/21 0309 11/27/21 0311  WBC 6.8  --  13.3*  --  12.1*  NEUTROABS 3.3  --   --   --   --   HGB 16.6 16.7 15.6 14.3 14.3  HCT 48.7 49.0 45.5 42.0 43.0  MCV 90.7  --  89.6  --  92.1  PLT 223  --  232  --  237    Basic Metabolic Panel: Recent Labs  Lab 11/25/21 1020 11/25/21 1029 11/26/21 0512 11/26/21 1624 11/26/21 2207 11/27/21 0309 11/27/21 0311  NA 139 140 138 138 139 144 142  K 3.8 3.6 3.8  --   --  3.8 3.7  CL 105 108 105  --   --   --  112*  CO2 22  --  22  --   --   --  22  GLUCOSE 134* 135* 117*  --   --   --  131*  BUN 18 21 15   --   --   --  17  CREATININE 1.02 0.90 0.83  --   --   --  0.89  CALCIUM 9.1  --  8.7*  --   --   --  7.8*   GFR: Estimated Creatinine Clearance: 85.5 mL/min (by C-G formula based on SCr of 0.89 mg/dL). Recent Labs  Lab 11/25/21 1020 11/26/21 0512 11/27/21 0311  WBC  6.8 13.3* 12.1*    Liver Function Tests: Recent Labs  Lab 11/25/21 1020  AST 31  ALT 29  ALKPHOS 86  BILITOT 1.2  PROT 6.9  ALBUMIN 3.9   No results for input(s): LIPASE, AMYLASE in the last 168 hours. No results for input(s): AMMONIA in the last 168 hours.  ABG    Component Value Date/Time   PHART 7.391 11/27/2021 0309   PCO2ART 38.7 11/27/2021 0309   PO2ART 212 (H) 11/27/2021 0309   HCO3 23.6 11/27/2021 0309   TCO2 25 11/27/2021 0309   ACIDBASEDEF 1.0 11/27/2021 0309   O2SAT 100.0 11/27/2021 0309     Coagulation Profile: Recent Labs  Lab 11/25/21 1020  INR 1.0    Cardiac Enzymes: No results for input(s): CKTOTAL, CKMB, CKMBINDEX, TROPONINI in the last 168 hours.  HbA1C: Hgb A1c MFr Bld  Date/Time Value Ref Range Status  11/26/2021 05:12 AM 5.8 (H) 4.8 - 5.6 % Final    Comment:    (NOTE) Pre diabetes:          5.7%-6.4%  Diabetes:              >6.4%  Glycemic control for   <7.0% adults with diabetes     CBG: Recent Labs  Lab 11/25/21 1022 11/25/21 1025  GLUCAP 132* 141*    Critical care time:     Total critical care time: 41 minutes  Performed by: [place] care time was exclusive of separately billable procedures and treating other patients.   Critical care was necessary to treat or prevent imminent or life-threatening deterioration.   Critical care was time spent personally by me on the following activities: development of treatment plan with patient and/or surrogate as well as nursing, discussions with consultants, evaluation of patient's response to treatment, examination of patient, obtaining history from patient or surrogate, ordering and performing treatments and interventions, ordering and review of laboratory studies, ordering and review of radiographic studies, pulse oximetry and re-evaluation of patient's condition.   Jacky Kindle MD [place] [facility] See Amion for pager If no response to pager,  please call [phone] until 7pm After 7pm, Please call E-link [phone]

## 2021-11-27 NOTE — Progress Notes (Signed)
PT Cancellation Note  Patient Details Name: [name] MRN: [id number] DOB: [birth year]   Cancelled Treatment:    Reason Eval/Treat Not Completed: Patient not medically ready. Pt with neuro change last evening resulting in a decompressive craniotomy and intubation. PT to return as appropriate to complete re-evaluation of mobility.  Kittie Plater, PT, DPT Acute Rehabilitation Services Pager #: [phone] Office #: [phone]    Berline Lopes 11/27/2021, 9:04 AM

## 2021-11-27 NOTE — Transfer of Care (Signed)
Immediate Anesthesia Transfer of Care Note  Patient: Lucila Maine  Procedure(s) Performed: SUBOCCIPITAL CRANIECTOMY (Head)  Patient Location: ICU  Anesthesia Type:General  Level of Consciousness: sedated and Patient remains intubated per anesthesia plan  Airway & Oxygen Therapy: Patient remains intubated per anesthesia plan and Patient placed on Ventilator (see vital sign flow sheet for setting)  Post-op Assessment: Report given to RN and Post -op Vital signs reviewed and stable  Post vital signs: Reviewed and stable  Last Vitals:  Vitals Value Taken Time  BP 109/54 11/27/21 0200  Temp 36.8 C 11/27/21 0135  Pulse 55 11/27/21 0203  Resp 18 11/27/21 0203  SpO2 100 % 11/27/21 0203  Vitals shown include unvalidated device data.  Last Pain:  Vitals:   11/27/21 0135  TempSrc: Axillary  PainSc:       Patients Stated Pain Goal: 3 (57/01/77 9390)  Complications: No notable events documented.

## 2021-11-27 NOTE — Progress Notes (Signed)
Speech Language Pathology Treatment: Dysphagia  Patient Details Name: [name] MRN: [id number] DOB: [birth year] Today's Date: 11/27/2021 Time: 1015-1030 SLP Time Calculation (min) (ACUTE ONLY): 15 min  Assessment / Plan / Recommendation Clinical Impression  Pt seen again after events of last night - emergent occipital craniotomy, less than 24 hour intubation. Pt hoarse but alert and anxious to eat and drink. BP very high, pt not stable for instrumental assessment. Trials of nectar thick liquids given: pt consumed 8 oz of nectar thick liquids with mild throat clearing. Consumed 4 oz of puree and a cracker without difficulty. Sips of water elicited immediate hard cough. Recommend pt resume nectar thick liquids and regular solids. Will f/u for further trials and need for instrumental assessment as pt stabilizes.   HPI HPI: 75 yo male with stroke risk factors of CAD and HLD. Stat MRI showed bilateral cerebellar infarcts and pt noted to have developed ataxia. Patient was given TNK as he was still in the window. On repeat exam, prior to TNK, his NIHSS was 2 for right sided ataxia. Pt has suffered from nausea and vomiting impacting success with [place] with current plan of care;MBS      Recommendations for follow up therapy are one component of a multi-disciplinary discharge planning process, led by the attending physician.  Recommendations may be updated based on patient status, additional functional criteria and insurance authorization.    Recommendations  Diet recommendations: Regular;Thin liquid Liquids provided via: Cup;Straw Medication Administration: Whole meds with liquid Supervision: Patient able to self feed Compensations: Slow rate;Small sips/bites Postural Changes and/or Swallow Maneuvers: Seated upright 90 degrees                Oral Care Recommendations: Oral care BID Follow Up Recommendations: Acute inpatient rehab (3hours/day) Assistance  recommended at discharge: Frequent or constant Supervision/Assistance SLP Visit Diagnosis: Dysarthria and anarthria (R47.1);Dysphagia, unspecified (R13.10) Plan: Continue with current plan of care;MBS       GO                Eliz Nigg, Katherene Ponto  11/27/2021, 10:42 AM

## 2021-11-27 NOTE — Progress Notes (Signed)
eLink Physician-Brief Progress Note Patient Name: [name] DOB: 05/17/1946 MRN: [id number]   Date of Service  11/27/2021  HPI/Events of Note  Patient is s/p ischemic cerebellar stroke s/p TPA, he developed cerebellar edema, mass effect on the 4 th ventricle, and increased ICP, and went to the OR for a sub-occipital craniectomy, patient returned to the ICU on the ventilator.  eICU Interventions  New Patient Evaluation. Orders entered. Neurosurgery is following.        [name] 11/27/2021, 2:02 AM

## 2021-11-27 NOTE — Progress Notes (Signed)
  Telemetry reviewed and stable. Shows sinus brady/NSR 50-60s  Continue diltiazem 60 mg q 8 hrs.  Can titrate as needed.   Plan to consolidate to 180 mg daily at discharge, and follow up with cardiology in [facility].   [place] once appropriate per neurology.   EP to see as needed while remains here.    Legrand Como [address]" [place], PA-C  11/27/2021 7:47 AM

## 2021-11-27 NOTE — Anesthesia Procedure Notes (Signed)
Arterial Line Insertion Start/End12/11/2021 11:35 PM, 11/26/2021 11:40 PM Performed by: Effie Berkshire, MD, anesthesiologist  Patient location: Pre-op. Preanesthetic checklist: patient identified, IV checked, site marked, risks and benefits discussed, surgical consent, monitors and equipment checked, pre-op evaluation, timeout performed and anesthesia consent Lidocaine 1% used for infiltration Right, radial was placed Catheter size: 20 G Hand hygiene performed  and maximum sterile barriers used   Attempts: 1 Procedure performed without using ultrasound guided technique. Following insertion, dressing applied and Biopatch. Post procedure assessment: normal and unchanged  Patient tolerated the procedure well with no immediate complications.

## 2021-11-27 NOTE — Progress Notes (Signed)
Neurosurgery Service Progress Note  Subjective: No acute events overnight   Objective: Vitals:   11/27/21 0500 11/27/21 0600 11/27/21 0700 11/27/21 0800  BP: 125/64 139/62 133/64   Pulse: (!) 49 67 (!) 59   Resp: 18 18 18    Temp:    100.1 F (37.8 C)  TempSrc:    Axillary  SpO2: 99% 100% 99%   Weight:      Height:        Physical Exam: Intubated, eyes open spontanoeusly, MAEx4 without preference, incision c/d/I with some staining on the pillow from likely pin site  Assessment & Plan: 75 y.o. man w/ cerebellar stroke s/p decompressive suboccipital craniectomy, post-op CTH with good decompression, improved 4th ventricular effacement, no hydro.  -ok for subQ heparin 12/14, can continue [place]  11/27/21 8:38 AM

## 2021-11-27 NOTE — Procedures (Signed)
Extubation Procedure Note  Patient Details:   Name: [name] DOB: 05/02/1946 MRN: [id number]   Airway Documentation:    Vent end date: 11/27/21 Vent end time: 0850   Evaluation  O2 sats: stable throughout Complications: No apparent complications Patient did tolerate procedure well. Bilateral Breath Sounds: Clear, Diminished   Yes  Pt extubated to [place] with RN at bedside. Positive cuff leak noted and pt is tolerating well. Rt will continue to monitor.   Cathie Olden 11/27/2021, 9:27 AM

## 2021-11-27 NOTE — Progress Notes (Addendum)
STROKE TEAM PROGRESS NOTE  INTERVAL HISTORY His wife, [name] is at the bedside.  Patient underwent an emergent suboccipital craniectomy overnight. He was extubated this morning. His right upper and lower extremities are weak with drift. He has a left gaze preference with some nystagmus. He is more alert today than he was last evening. He is eager to eat and drink. Hypertonic saline is still infusing, cleviprex infusing for blood pressure control.   Vitals:   11/27/21 0500 11/27/21 0600 11/27/21 0700 11/27/21 0800  BP: 125/64 139/62 133/64   Pulse: (!) 49 67 (!) 59   Resp: 18 18 18    Temp:    100.1 F (37.8 C)  TempSrc:    Axillary  SpO2: 99% 100% 99%   Weight:      Height:       CBC:  Recent Labs  Lab 11/25/21 1020 11/25/21 1029 11/26/21 0512 11/27/21 0309 11/27/21 0311  WBC 6.8  --  13.3*  --  12.1*  NEUTROABS 3.3  --   --   --   --   HGB 16.6   < > 15.6 14.3 14.3  HCT 48.7   < > 45.5 42.0 43.0  MCV 90.7  --  89.6  --  92.1  PLT 223  --  232  --  217   < > = values in this interval not displayed.    Basic Metabolic Panel:  Recent Labs  Lab 11/26/21 0512 11/26/21 1624 11/27/21 0309 11/27/21 0311  NA 138   < > 144 142  K 3.8  --  3.8 3.7  CL 105  --   --  112*  CO2 22  --   --  22  GLUCOSE 117*  --   --  131*  BUN 15  --   --  17  CREATININE 0.83  --   --  0.89  CALCIUM 8.7*  --   --  7.8*   < > = values in this interval not displayed.    Lipid Panel:  Recent Labs  Lab 11/26/21 0512  CHOL 164  TRIG 52  HDL 48  CHOLHDL 3.4  VLDL 10  LDLCALC 106*   HgbA1c:  Recent Labs  Lab 11/26/21 0512  HGBA1C 5.8*    Urine Drug Screen:  Recent Labs  Lab 11/25/21 1646  LABOPIA NONE DETECTED  COCAINSCRNUR NONE DETECTED  LABBENZ NONE DETECTED  AMPHETMU NONE DETECTED  THCU NONE DETECTED  LABBARB NONE DETECTED     Alcohol Level No results for input(s): ETH in the last 168 hours.  IMAGING past 24 hours CT HEAD WO CONTRAST (5MM)  Result Date:  11/27/2021 CLINICAL DATA:  Stroke, follow-up suboccipital decompression EXAM: CT HEAD WITHOUT CONTRAST TECHNIQUE: Contiguous axial images were obtained from the base of the skull through the vertex without intravenous contrast. COMPARISON:  11/26/2021 FINDINGS: Brain: Status post interval suboccipital decompression, with pneumocephalus in the posterior fossa, not unexpected postoperatively. Redemonstrated hypodensity in the right-greater-than-left cerebellum, not significantly changed in extent since the prior exam. Persistent edema and mass effect on the fourth ventricle. Unchanged size and configuration of the lateral and third ventricles. No new area of infarction identified. No acute hemorrhage. Vascular: No hyperdense vessel. Skull: Status post suboccipital craniectomy.  No acute fracture. Sinuses/Orbits: Mucosal thickening in the ethmoid air cells. The orbits are unremarkable. Other: Trace fluid in right mastoid air cells. The patient is intubated. IMPRESSION: Status post interval suboccipital decompression, with unchanged right greater than left cerebellar infarcts and no  new infarction. Unchanged narrowing of the fourth ventricle, without evidence of ventricular obstruction or hydrocephalus. Electronically Signed   By: Merilyn Baba M.D.   On: 11/27/2021 03:20   CT HEAD WO CONTRAST (5MM)  Result Date: 11/26/2021 CLINICAL DATA:  New onset aphasia and disorientation. Hydrocephalus. EXAM: CT HEAD WITHOUT CONTRAST TECHNIQUE: Contiguous axial images were obtained from the base of the skull through the vertex without intravenous contrast. COMPARISON:  MRI same day.  CT and MRI yesterday. FINDINGS: Brain: Low-density now present in the areas of acute cerebellar infarction, much more extensive in the right hemisphere than the left. Swelling and mass-effect upon the fourth ventricle. No evidence of hemorrhage. Third ventricular and lateral ventricular size remains stable, therefore there is no fourth  ventricular obstruction. No new infarction is identified. Cerebral hemispheres appear normal. Vascular: No abnormal vascular finding. Skull: Normal Sinuses/Orbits: Clear/normal Other: None IMPRESSION: Low-density and swelling now present in the areas of acute cerebellar infarction, much more extensive on the right than the left. Mass-effect upon the fourth ventricle but no evidence of ventricular obstruction as evidenced by stable size of the lateral and third ventricles. Electronically Signed   By: Nelson Chimes M.D.   On: 11/26/2021 18:48   MR BRAIN WO CONTRAST  Result Date: 11/26/2021 CLINICAL DATA:  Neuro deficit, acute, stroke suspected. EXAM: MRI HEAD WITHOUT CONTRAST TECHNIQUE: Multiplanar, multiecho pulse sequences of the brain and surrounding structures were obtained without intravenous contrast. COMPARISON:  MRI of the brain November 25, 2021. FINDINGS: Acute infarct involving most of the right hemisphere and extending to the corresponding medial cerebellar peduncle with multiple punctate foci of susceptibility artifact consistent with petechial hemorrhage. Small infarcts in the left cerebellar hemisphere. There is interval progression of edema with mass effect on the fourth ventricle and low lying right cerebellar tonsil. There is no hydrocephalus. No focus of restricted diffusion in the supratentorial compartment. IMPRESSION: Interval progression of edema associated to bilateral cerebellar acute infarcts with mass effect on the fourth ventricle and low lying right cerebellar tonsil. No hydrocephalus. Electronically Signed   By: Pedro Earls M.D.   On: 11/26/2021 12:40   DG CHEST PORT 1 VIEW  Result Date: 11/27/2021 CLINICAL DATA:  Check intubation status. EXAM: PORTABLE CHEST 1 VIEW COMPARISON:  None. FINDINGS: Endotracheal tube is in place with the tip 6 cm from the carina. There are intact sternotomy sutures with CABG changes. On the left there is increased opacity in the  lower lobe distribution consistent with pneumonia or aspiration with small left pleural effusion consistent with a parapneumonic pleural effusion. The remaining lungs are generally clear. The right sulci are sharp. There is thoracic spondylosis. IMPRESSION: 1. ETT tip 6 cm from the carina. 2. Left lower lobe consolidation with small adjacent left pleural effusion. Findings are likely either due to pneumonia or aspiration. 3. Clinical correlation and radiographic follow-up recommended. Electronically Signed   By: Telford Nab M.D.   On: 11/27/2021 04:40   ECHOCARDIOGRAM COMPLETE  Result Date: 11/26/2021    ECHOCARDIOGRAM REPORT   Patient Name:   CHRISTORPHER HISAW Date of Exam: 11/26/2021 Medical Rec #:  [id number]     Height:       70.0 in Accession #:    [id number]    Weight:       216.3 lb Date of Birth:  04-29-1946     BSA:          2.158 m Patient Age:    21 years  BP:           163/80 mmHg Patient Gender: M             HR:           68 bpm. Exam Location:  Inpatient Procedure: 2D Echo, Cardiac Doppler and Color Doppler Indications:    Stroke.  History:        Patient has no prior history of Echocardiogram examinations.                 CAD, Prior CABG; Risk Factors:Dyslipidemia.  Sonographer:    Philipp Deputy RDCS Referring Phys: [place]  1. Left ventricular ejection fraction, by estimation, is 70 to 75%. The left ventricle has hyperdynamic function. The left ventricle has no regional wall motion abnormalities. The left ventricular internal cavity size was mildly dilated. Left ventricular diastolic parameters are consistent with Grade I diastolic dysfunction (impaired relaxation). Elevated left atrial pressure.  2. RV is not well seen but overall RVEF is probably normal . The right ventricular size is normal.  3. The mitral valve is normal in structure. Mild mitral valve regurgitation.  4. The aortic valve is tricuspid. Aortic valve regurgitation is not visualized. Aortic  valve sclerosis/calcification is present, without any evidence of aortic stenosis.  5. The inferior vena cava is normal in size with greater than 50% respiratory variability, suggesting right atrial pressure of 3 mmHg. FINDINGS  Left Ventricle: Left ventricular ejection fraction, by estimation, is 70 to 75%. The left ventricle has hyperdynamic function. The left ventricle has no regional wall motion abnormalities. The left ventricular internal cavity size was mildly dilated. There is no left ventricular hypertrophy. Left ventricular diastolic parameters are consistent with Grade I diastolic dysfunction (impaired relaxation). Elevated left atrial pressure. Right Ventricle: RV is not well seen but overall RVEF is probably normal. The right ventricular size is normal. Right vetricular wall thickness was not assessed. Left Atrium: Left atrial size was normal in size. Right Atrium: Right atrial size was normal in size. Pericardium: There is no evidence of pericardial effusion. Mitral Valve: The mitral valve is normal in structure. Mild mitral valve regurgitation. Tricuspid Valve: The tricuspid valve is grossly normal. Tricuspid valve regurgitation is trivial. Aortic Valve: The aortic valve is tricuspid. Aortic valve regurgitation is not visualized. Aortic valve sclerosis/calcification is present, without any evidence of aortic stenosis. Pulmonic Valve: The pulmonic valve was not well visualized. Pulmonic valve regurgitation is not visualized. Aorta: The aortic root and ascending aorta are structurally normal, with no evidence of dilitation. Venous: The inferior vena cava is normal in size with greater than 50% respiratory variability, suggesting right atrial pressure of 3 mmHg. IAS/Shunts: No atrial level shunt detected by color flow Doppler.  LEFT VENTRICLE PLAX 2D LVIDd:         5.20 cm Diastology LVIDs:         3.00 cm LV e' medial:    4.90 cm/s LV PW:         1.00 cm LV E/e' medial:  26.3 LV IVS:        1.00 cm LV e'  lateral:   4.46 cm/s                        LV E/e' lateral: 28.9  RIGHT VENTRICLE RV Basal diam:  3.60 cm RV S prime:     11.60 cm/s TAPSE (M-mode): 2.4 cm LEFT ATRIUM  Index        RIGHT ATRIUM           Index LA diam:        4.00 cm 1.85 cm/m   RA Area:     16.30 cm LA Vol (A2C):   93.3 ml 43.24 ml/m  RA Volume:   41.50 ml  19.23 ml/m LA Vol (A4C):   68.7 ml 31.84 ml/m LA Biplane Vol: 85.6 ml 39.67 ml/m  AORTIC VALVE LVOT Vmax:   139.00 cm/s LVOT Vmean:  97.400 cm/s LVOT VTI:    0.284 m  AORTA Ao Root diam: 4.00 cm MITRAL VALVE MV Area (PHT): 3.61 cm     SHUNTS MV Decel Time: 210 msec     Systemic VTI: 0.28 m MV E velocity: 129.00 cm/s MV A velocity: 34.30 cm/s MV E/A ratio:  3.76 Dorris Carnes MD Electronically signed by Dorris Carnes MD Signature Date/Time: 11/26/2021/4:24:00 PM    Final     PHYSICAL EXAM  Temp:  [98.2 F (36.8 C)-100.1 F (37.8 C)] 100.1 F (37.8 C) (12/13 0800) Pulse Rate:  [49-74] 59 (12/13 0700) Resp:  [15-24] 18 (12/13 0700) BP: (103-184)/(53-88) 133/64 (12/13 0700) SpO2:  [89 %-100 %] 99 % (12/13 0700) FiO2 (%):  [40 %-100 %] 40 % (12/13 0805)  General - Well nourished, well developed, in no apparent distress.  Ophthalmologic - fundi not visualized due to noncooperation.  Cardiovascular - Regular rhythm and rate.  Mental Status -  Level of arousal and orientation to time, place, and person were intact.  Mild psychomotor slowing Language including expression, naming, repetition, comprehension was assessed and found intact. Recent and remote memory were intact. Fund of Knowledge was assessed and was intact.  Cranial Nerves II - XII - II -no visual field deficit, difficulty assessing right visual field III, IV, VI -Nystagmus.  Right gaze deficit V - Facial sensation intact bilaterally. VII - Facial movement intact bilaterally. VIII - Hearing & vestibular intact bilaterally. X - Palate elevates symmetrically. XI - Chin turning & shoulder shrug  intact bilaterally. XII - Tongue protrusion intact.  Motor Strength -right upper extremity and right lower extremity slightly weaker than left. Motor Tone - Muscle tone was assessed at the neck and appendages and was normal. Reflexes - The patients reflexes were symmetrical in all extremities and he had no pathological reflexes. Sensory - Light touch, temperature/pinprick were assessed and were symmetrical.   Coordination -difficulty with rapid alternating movement right upper and lower extremity FTN and HTK moderate ataxia Left upper and lower extremity FTN and HTK mild ataxia  Gait and Station - deferred.  ASSESSMENT/PLAN [name] is a 75 y.o. male with history of CABG x4, hyperlipidemia, prostate cancer, internal hemorrhoid, and a nonobstructive calculus in right interpole with hematuria presenting with acute onset vertigo nausea vomiting and an inability to walk.  At 10 AM on November 25, 2021 patient developed vertigo nausea and vomiting.  He also noted abdominal discomfort.  A code stroke was called on his arrival to triage.  Once the patient was in the CT scanner he developed more nausea and vomiting he went to a stat MRI which was positive for bilateral cerebellar strokes.  He then developed ataxia in his bilateral upper and lower extremities.  He was then given TNKase and went for a stat CTA of the head.  No LVO was shown on the CTA. Repeat MRI showed progression of edema related to the cerebellar infarcts with mass effect on the  4th ventricle. Neurosurgery consulted and hypertonic saline initiated. 12/13 suboccipital craniectomy overnight.  Patient tolerated procedure well.  Extubated this morning.  Speech therapy recommends resuming nectar thickened liquids with his diet.  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 ASPECTS 10.    Repeat CT-extensive swelling in the areas of the acute cerebellar  infarction more extensive on the right than the left.  Mass-effect on the fourth ventricle.  No evidence of ventricular obstruction.  Stable lateral and third ventricles. Post craniectomy CT-Unchanged right greater than left cerebellar infarcts.  No new infarction.  Unchanged narrowing of the fourth ventricle.  No evidence of ventricular obstruction or hydrocephalus. CTA head & neck unremarkable MRI  -acute infarct of the cerebellum bilaterally.  negative for hemorrhage.  MRI Repeat 12/12- Interval progression of edema associated to bilateral cerebellar acute infarcts with mass effect on the fourth ventricle and low lying right cerebellar tonsil. No hydrocephalus 2D Echo EF 70-75% LDL 106 HgbA1c 5.8 VTE prophylaxis - SCDs aspirin 81 mg daily prior to admission, now on No antithrombotic within 24h of TNK Therapy recommendations: Inpatient rehab- [facility] rehab closer to patients home Disposition: Pending  Cerebellar Edema s/p suboccipital decompression  3% normal saline initiated on 12/12 Sodium goal is 150-155 Na 139-144-145 S/p suboccipital decompression with Dr. Zada Finders Close neuro monitoring  Hypertension Home meds: None Stable- BP <160 per neurosurgery and CCM Cleviprex taper off as able On cardizem Add cozaar 50 D/c ART Line  Atrial flutter - new diagnosis Cardiology on board IV Cardizem changed to p.o. 60 mg Q8 As needed metoprolol for rate control  Hyperlipidemia Home meds: Atorvastatin 80 mg, resumed in hospital LDL 106, goal < 70 Atorvastatin 80 mg daily Continue statin at discharge  Other Stroke Risk Factors Advanced Age >/= 82  Obesity, Body mass index is 31.03 kg/m., BMI >/= 30 associated with increased stroke risk, recommend weight loss, diet and exercise as appropriate  Coronary artery disease s/p CABG  Other Active Problems Abdominal discomfort CTA abdomen pelvis-no evidence of active GI bleed.  Moderate aortic atherosclerosis.  Diverticulosis with no  evidence of diverticulitis. Internal hemorrhoid Nonobstructive calculus in right interpole Restart Flomax prior to removing Foley catheter Hematuria one month ago  Hospital day # 2  Patient seen and examined by NP/APP with MD. MD to update note as needed.   Janine Ores, DNP, FNP-BC [facility] Pager: [phone]  ATTENDING NOTE: I reviewed above note and agree with the assessment and plan. Pt was seen and examined.   Wife at bedside.  Patient lying in bed, asking for water, denies headache this morning.  He had suboccipital decompression procedure with Dr. Venetia Constable last night, extubated this morning, doing well, still has mild dysarthria, right gaze palsy but improving, and bilateral ataxia.  PT/OT recommend CIR.  Continue 3% saline at this moment, Na goal 150-155.  BP goal less than 160 per neurosurgery, continue Cardizem and add on Cozaar.  Taper off Cleviprex as able.  Continue aspirin Plavix and statin.  Close neuro monitoring.  For detailed assessment and plan, please refer to above as I have made changes wherever appropriate.   Rosalin Hawking, MD PhD Stroke Neurology 11/27/2021 5:20 PM  This patient is critically ill due to large cerebellar infarct, cerebral edema, status post suboccipital decompression procedure, a flutter, hypertension and at significant risk of neurological worsening, death form recurrent stroke, hemorrhagic conversion, brain herniation, brainstem compression, recurrent stroke, heart failure, seizure,  obstructive hydrocephalus. This patient's care requires constant monitoring of vital signs, hemodynamics, respiratory and cardiac monitoring, review of multiple databases, neurological assessment, discussion with family, other specialists and medical decision making of high complexity. I spent 45 minutes of neurocritical care time in the care of this patient. I had long discussion with wife and patient at bedside, updated pt current condition, treatment  plan and potential prognosis, and answered all the questions.  They expressed understanding and appreciation.       To contact Stroke Continuity provider, please refer to [email or web address] After hours, contact General Neurology

## 2021-11-27 NOTE — Progress Notes (Signed)
Inpatient Rehabilitation Admissions Coordinator    Inpatient rehab consult received. No family present. I will follow up with family for full assessment for a possible CIR admit here at [facility] if their preference of [facility] is not available.  Danne Baxter, RN, MSN Rehab Admissions Coordinator [phone] 11/27/2021 3:06 PM

## 2021-11-27 NOTE — Anesthesia Procedure Notes (Signed)
Procedure Name: Intubation Date/Time: 11/27/2021 12:21 AM Performed by: Clovis Cao, CRNA Pre-anesthesia Checklist: Patient identified, Emergency Drugs available, Suction available and Patient being monitored Patient Re-evaluated:Patient Re-evaluated prior to induction Oxygen Delivery Method: Circle system utilized Preoxygenation: Pre-oxygenation with 100% oxygen Induction Type: IV induction, Rapid sequence and Cricoid Pressure applied Laryngoscope Size: Miller and 2 Grade View: Grade I Tube type: Oral Tube size: 8.0 mm Number of attempts: 1 Airway Equipment and Method: Stylet Placement Confirmation: ETT inserted through vocal cords under direct vision, positive ETCO2 and breath sounds checked- equal and bilateral Secured at: 22 cm Tube secured with: Tape Dental Injury: Teeth and Oropharynx as per pre-operative assessment

## 2021-11-27 NOTE — Progress Notes (Signed)
RT note: RT and RN transported vent patient to CT and back to 4N 29. Vitals signs stable through out.

## 2021-11-28 ENCOUNTER — Inpatient Hospital Stay (HOSPITAL_COMMUNITY): Payer: Medicare Other

## 2021-11-28 DIAGNOSIS — E876 Hypokalemia: Secondary | ICD-10-CM

## 2021-11-28 DIAGNOSIS — R509 Fever, unspecified: Secondary | ICD-10-CM | POA: Diagnosis not present

## 2021-11-28 DIAGNOSIS — I63443 Cerebral infarction due to embolism of bilateral cerebellar arteries: Secondary | ICD-10-CM

## 2021-11-28 DIAGNOSIS — D72829 Elevated white blood cell count, unspecified: Secondary | ICD-10-CM

## 2021-11-28 DIAGNOSIS — R410 Disorientation, unspecified: Secondary | ICD-10-CM

## 2021-11-28 DIAGNOSIS — I4892 Unspecified atrial flutter: Secondary | ICD-10-CM | POA: Diagnosis not present

## 2021-11-28 DIAGNOSIS — I639 Cerebral infarction, unspecified: Secondary | ICD-10-CM | POA: Diagnosis not present

## 2021-11-28 DIAGNOSIS — G936 Cerebral edema: Secondary | ICD-10-CM | POA: Diagnosis not present

## 2021-11-28 LAB — SODIUM
Sodium: 155 mmol/L — ABNORMAL HIGH (ref 135–145)
Sodium: 156 mmol/L — ABNORMAL HIGH (ref 135–145)
Sodium: 157 mmol/L — ABNORMAL HIGH (ref 135–145)

## 2021-11-28 LAB — BASIC METABOLIC PANEL
Anion gap: 7 (ref 5–15)
BUN: 15 mg/dL (ref 8–23)
CO2: 24 mmol/L (ref 22–32)
Calcium: 7.9 mg/dL — ABNORMAL LOW (ref 8.9–10.3)
Chloride: 121 mmol/L — ABNORMAL HIGH (ref 98–111)
Creatinine, Ser: 1.12 mg/dL (ref 0.61–1.24)
GFR, Estimated: >60 mL/min (ref >60)
Glucose, Bld: 124 mg/dL — ABNORMAL HIGH (ref 70–99)
Potassium: 3.2 mmol/L — ABNORMAL LOW (ref 3.5–5.1)
Sodium: 152 mmol/L — ABNORMAL HIGH (ref 135–145)

## 2021-11-28 LAB — CBC
HCT: 43 % (ref 39.0–52.0)
Hemoglobin: 14.6 g/dL (ref 13.0–17.0)
MCH: 31.1 pg (ref 26.0–34.0)
MCHC: 34 g/dL (ref 30.0–36.0)
MCV: 91.5 fL (ref 80.0–100.0)
Platelets: 195 10*3/uL (ref 150–400)
RBC: 4.7 MIL/uL (ref 4.22–5.81)
RDW: 14.7 % (ref 11.5–15.5)
WBC: 13.6 10*3/uL — ABNORMAL HIGH (ref 4.0–10.5)
nRBC: 0 % (ref 0.0–0.2)

## 2021-11-28 IMAGING — CT CT HEAD W/O CM
3 of 4 series · 13 of 47 positions shown, 15 images · non-contrast
Comparison: [DATE]

CLINICAL DATA: Stroke, follow-up

EXAM:
CT HEAD WITHOUT CONTRAST
TECHNIQUE: Contiguous axial images were obtained from the base of the skull
through the vertex without intravenous contrast.

[Series 4: head without cor · coronal · non-contrast · 0.37mm/px · 3 of 71 slices shown]
[im 25/71  brain]
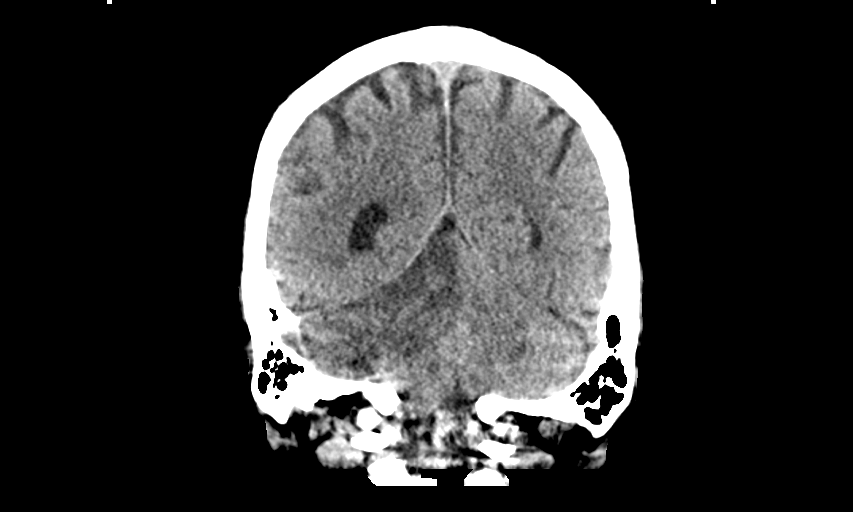
[im 32/71  brain]
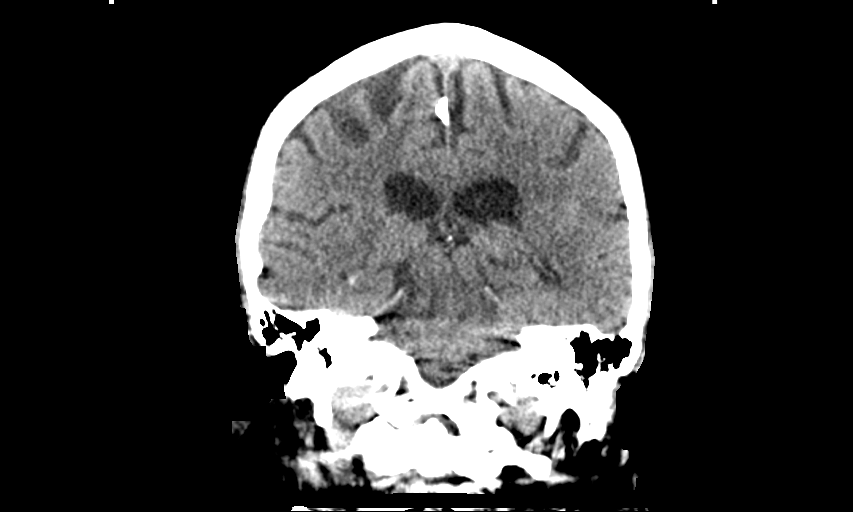
[im 39/71  brain]
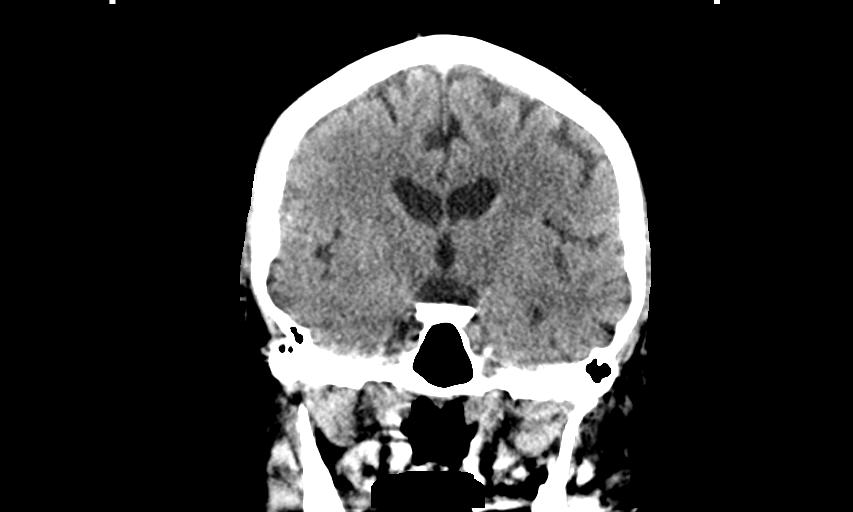

[Series 5: head without · axial · non-contrast · 0.41mm/px · z∈[-172,-32]mm · 7 of 38 slices shown, 9 images]
[im 5/38  brain]
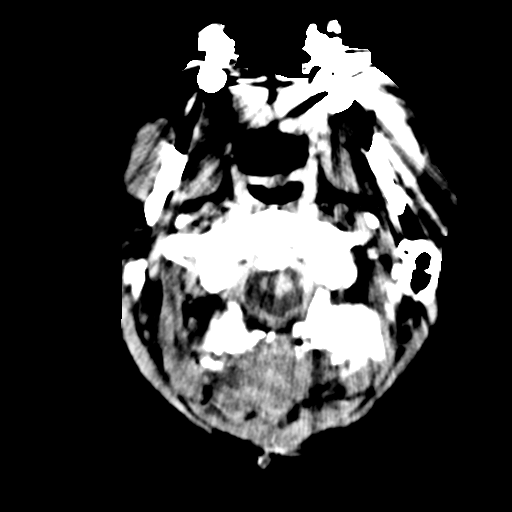
[im 5/38  bone]
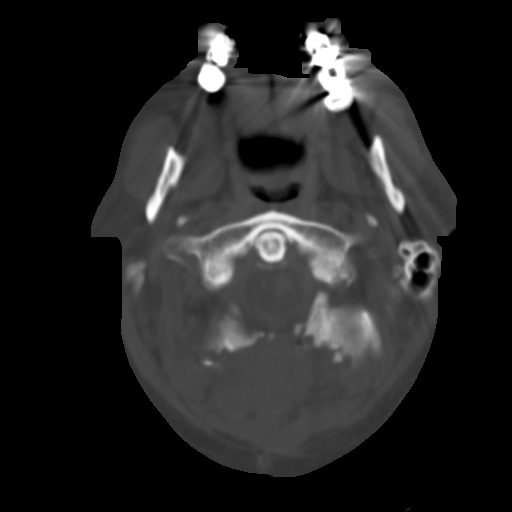
[im 10/38  brain]
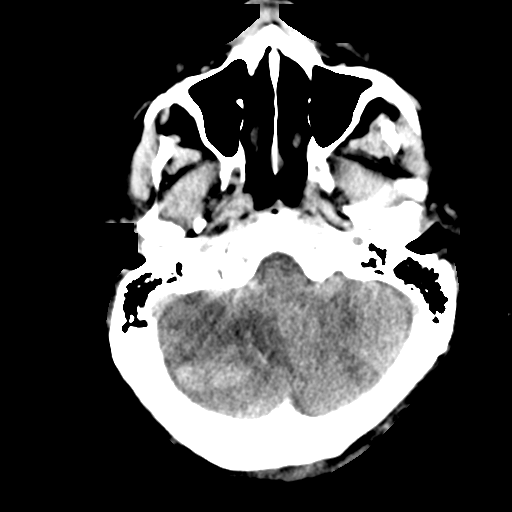
[im 14/38  brain]
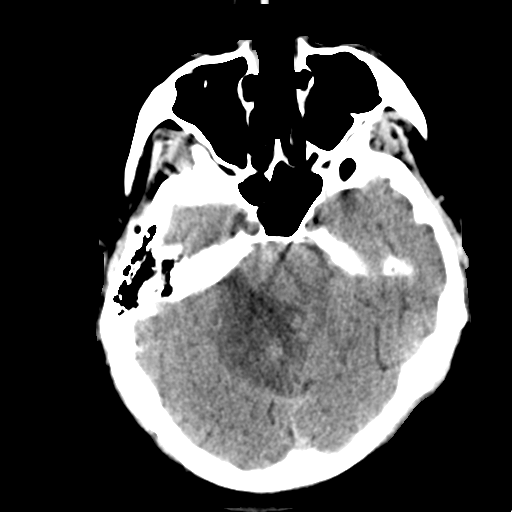
[im 19/38  brain]
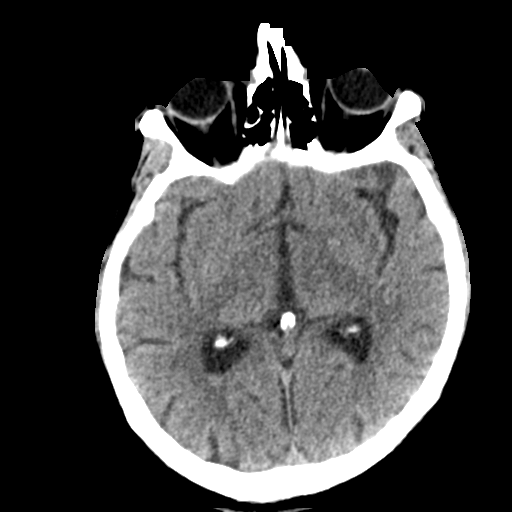
[im 24/38  brain]
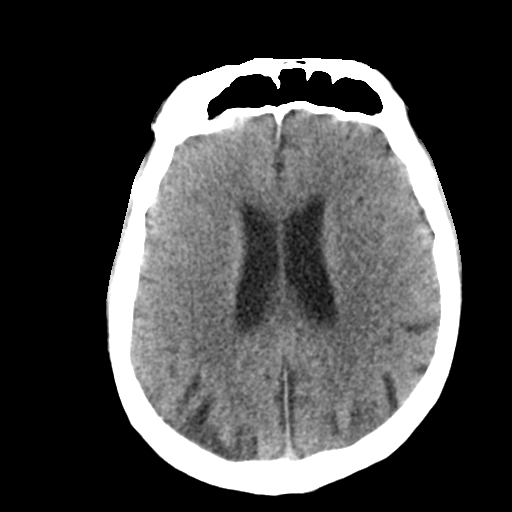
[im 24/38  bone]
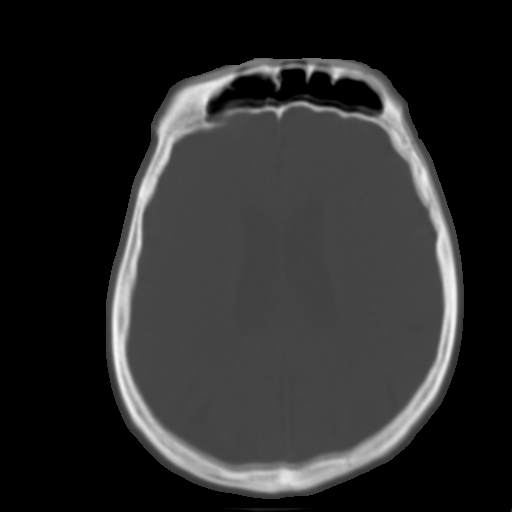
[im 28/38  brain]
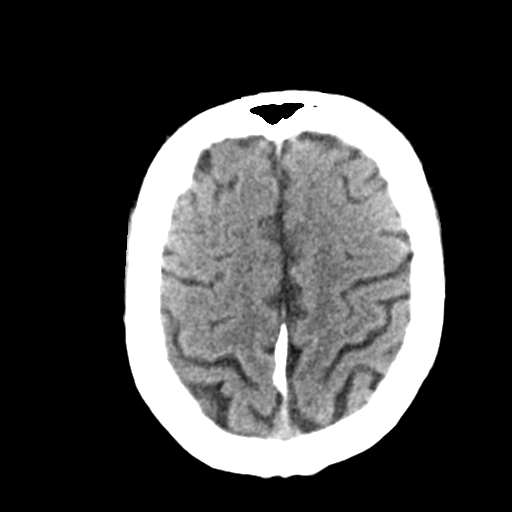
[im 33/38  brain]
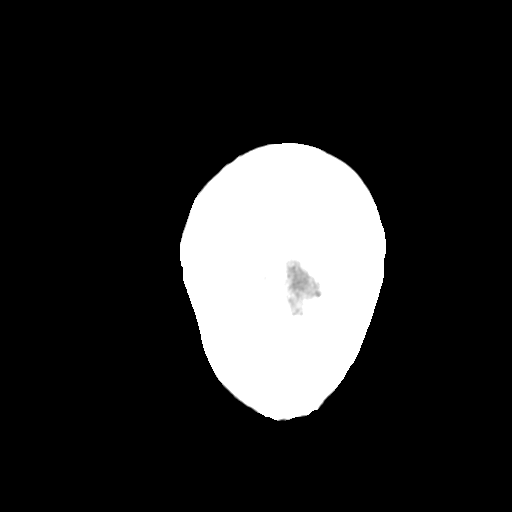

[Series 6: head without sag · sagittal · non-contrast · 0.37mm/px · 3 of 66 slices shown]
[im 22/66  brain]
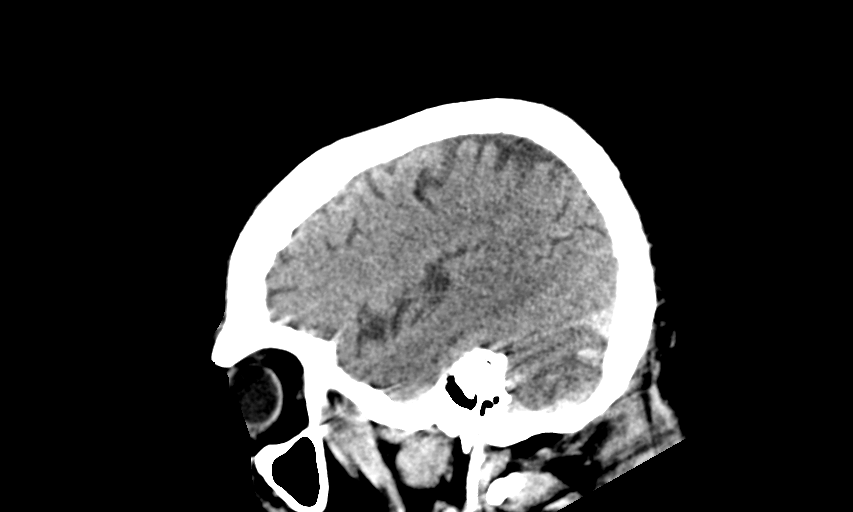
[im 33/66  brain]
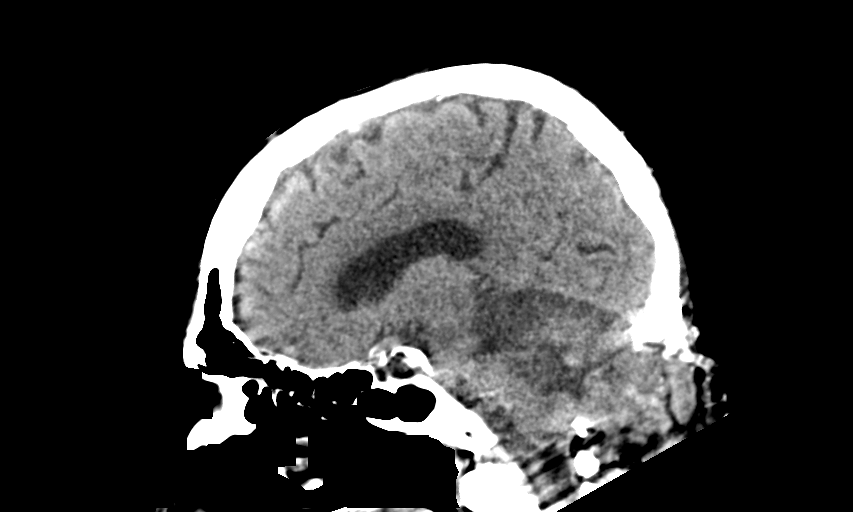
[im 44/66  brain]
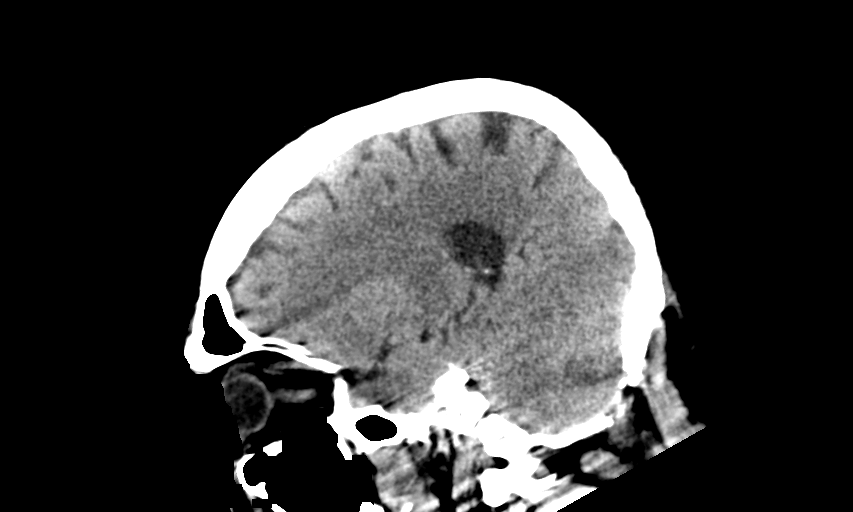

[13 of 47 positions shown; findings below may reference images not displayed]

FINDINGS: Brain: Redemonstrated hypodensity in the right greater than left
cerebellum, with new areas of hyperdensity in the right cerebellum,
concerning for hemorrhage. There is grossly unchanged mass effect on
the fourth ventricle, without enlargement of the lateral or third
ventricles to suggest hydrocephalus. No new area of infarction is
identified.

Vascular: No hyperdense vessel.

Skull: Status post suboccipital craniotomy.

Sinuses/Orbits: No acute finding.

Other: The mastoids are well aerated.
IMPRESSION: New hyperdensity in the right cerebellar infarct, most likely
hemorrhage. No evidence of increased mass effect, with unchanged
compression of the fourth ventricle without resulting hydrocephalus.

These results will be called to the ordering clinician or
representative by the Radiologist Assistant, and communication
documented in the PACS or [REDACTED].

## 2021-11-28 MED ORDER — ALBUTEROL SULFATE (2.5 MG/3ML) 0.083% IN NEBU
INHALATION_SOLUTION | RESPIRATORY_TRACT | Status: AC
  Administered 2021-11-28: 16:00:00 2.5 mg
  Filled 2021-11-28: qty 3

## 2021-11-28 MED ORDER — QUETIAPINE FUMARATE 25 MG PO TABS
25.0000 mg | ORAL_TABLET | Freq: Every day | ORAL | Status: DC
  Administered 2021-11-28: 21:00:00 25 mg via ORAL
  Filled 2021-11-28: qty 1

## 2021-11-28 MED ORDER — CALCIUM GLUCONATE-NACL 1-0.675 GM/50ML-% IV SOLN
1.0000 g | Freq: Once | INTRAVENOUS | Status: AC
  Administered 2021-11-28: 12:00:00 1000 mg via INTRAVENOUS
  Filled 2021-11-28: qty 50

## 2021-11-28 MED ORDER — SODIUM CHLORIDE 0.9 % IV SOLN: INTRAVENOUS | Status: DC

## 2021-11-28 MED ORDER — PANTOPRAZOLE SODIUM 40 MG PO TBEC
40.0000 mg | DELAYED_RELEASE_TABLET | Freq: Every day | ORAL | Status: DC
  Administered 2021-11-28: 18:00:00 40 mg via ORAL
  Filled 2021-11-28: qty 1

## 2021-11-28 MED ORDER — POTASSIUM CHLORIDE 10 MEQ/100ML IV SOLN: 10.0000 meq | INTRAVENOUS | Status: DC

## 2021-11-28 MED ORDER — LOSARTAN POTASSIUM 50 MG PO TABS
100.0000 mg | ORAL_TABLET | Freq: Every day | ORAL | Status: DC
  Administered 2021-11-28: 11:00:00 100 mg via ORAL
  Filled 2021-11-28: qty 2

## 2021-11-28 MED ORDER — POTASSIUM CHLORIDE 20 MEQ PO PACK
60.0000 meq | PACK | Freq: Two times a day (BID) | ORAL | Status: AC
  Administered 2021-11-28 (×2): 60 meq via ORAL
  Filled 2021-11-28 (×2): qty 3

## 2021-11-28 MED ORDER — POTASSIUM CHLORIDE 20 MEQ PO PACK: 60.0000 meq | PACK | Freq: Once | ORAL | Status: DC

## 2021-11-28 MED ORDER — ENOXAPARIN SODIUM 40 MG/0.4ML IJ SOSY
40.0000 mg | PREFILLED_SYRINGE | INTRAMUSCULAR | Status: DC
  Administered 2021-11-28: 18:00:00 40 mg via SUBCUTANEOUS
  Filled 2021-11-28: qty 0.4

## 2021-11-28 NOTE — Progress Notes (Signed)
Pt transported to CT and back to 4N29. Vital signs stable throughout.

## 2021-11-28 NOTE — Progress Notes (Addendum)
STROKE TEAM PROGRESS NOTE  INTERVAL HISTORY Patient is seen in his room with his wife [name] at the bedside.  He has been doing well overnight, remains hemodynamically stable and his neurological exam is stable.  Vitals:   11/28/21 1358 11/28/21 1400 11/28/21 1500 11/28/21 1530  BP: (!) 163/93  (!) 150/59 (!) 161/59  Pulse:  86 (!) 53 60  Resp:  13 (!) 26 (!) 22  Temp:      TempSrc:      SpO2:  (!) 88% 94% 93%  Weight:      Height:       CBC:  Recent Labs  Lab 11/25/21 1020 11/25/21 1029 11/27/21 0311 11/28/21 0331  WBC 6.8   < > 12.1* 13.6*  NEUTROABS 3.3  --   --   --   HGB 16.6   < > 14.3 14.6  HCT 48.7   < > 43.0 43.0  MCV 90.7   < > 92.1 91.5  PLT 223   < > 217 195   < > = values in this interval not displayed.    Basic Metabolic Panel:  Recent Labs  Lab 11/27/21 0311 11/27/21 1059 11/28/21 0331 11/28/21 1002  NA 142   < > 152* 155*  K 3.7  --  3.2*  --   CL 112*  --  121*  --   CO2 22  --  24  --   GLUCOSE 131*  --  124*  --   BUN 17  --  15  --   CREATININE 0.89  --  1.12  --   CALCIUM 7.8*  --  7.9*  --    < > = values in this interval not displayed.    Lipid Panel:  Recent Labs  Lab 11/26/21 0512  CHOL 164  TRIG 52  HDL 48  CHOLHDL 3.4  VLDL 10  LDLCALC 106*    HgbA1c:  Recent Labs  Lab 11/26/21 0512  HGBA1C 5.8*    Urine Drug Screen:  Recent Labs  Lab 11/25/21 1646  LABOPIA NONE DETECTED  COCAINSCRNUR NONE DETECTED  LABBENZ NONE DETECTED  AMPHETMU NONE DETECTED  THCU NONE DETECTED  LABBARB NONE DETECTED     Alcohol Level No results for input(s): ETH in the last 168 hours.  IMAGING past 24 hours No results found.  PHYSICAL EXAM  Temp:  [99.6 F (37.6 C)-101.5 F (38.6 C)] 100.6 F (38.1 C) (12/14 1200) Pulse Rate:  [51-91] 60 (12/14 1530) Resp:  [7-32] 22 (12/14 1530) BP: (111-187)/(49-112) 161/59 (12/14 1530) SpO2:  [88 %-96 %] 93 % (12/14 1530)  General - Well nourished, well developed, in no apparent  distress.  Cardiovascular - Regular rhythm and rate.  Respiratory- respirations even and unlabored   NEURO:  Mental Status: AA&Ox3  Speech/Language: speech with some aphasia, patient will speak in words and short phrases.  He will often have difficulty finding the right word and may repeat a single word as an answer to multiple questions.    Cranial Nerves:  II: PERRL. Visual fields full.  III, IV, VI: EOMI.  Horizontal nystagmus Eyelids elevate symmetrically.  V: Sensation is intact to light touch and symmetrical to face.  VII: Smile is symmetrical. .  VIII: hearing intact to voice. IX, X:  Phonation is normal.  XII: tongue is midline without fasciculations. Motor: 4+/5 strength in RUE, 5/5 strength to all other muscle groups tested.  Tone: is normal and bulk is normal Sensation- Intact to light touch  bilaterally. Extinction absent to light touch to DSS. Sharp/Dull   Vibration.   Coordination: FTN with some dysmetria, HKS: ataxia in BLE. Drift in RUE Gait- deferred   ASSESSMENT/PLAN [name] is a 75 y.o. male with history of CABG x4, hyperlipidemia, prostate cancer, internal hemorrhoid, and a nonobstructive calculus in right interpole with hematuria presenting with acute onset vertigo nausea vomiting and an inability to walk.  At 10 AM on November 25, 2021 patient developed vertigo nausea and vomiting.  He also noted abdominal discomfort.  A code stroke was called on his arrival to triage.  Once the patient was in the CT scanner he developed more nausea and vomiting he went to a stat MRI which was positive for bilateral cerebellar strokes.  He then developed ataxia in his bilateral upper and lower extremities.  He was then given TNKase and went for a stat CTA of the head.  No LVO was shown on the CTA. Repeat MRI showed progression of edema related to the cerebellar infarcts with mass effect on the 4th ventricle. Neurosurgery consulted and hypertonic saline initiated. 12/13  suboccipital craniectomy overnight.  Patient tolerated procedure well.  Speech therapy recommends resuming nectar thickened liquids with his diet.  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 ASPECTS 10.    Repeat CT-extensive swelling in the areas of the acute cerebellar infarction more extensive on the right than the left.  Mass-effect on the fourth ventricle.  No evidence of ventricular obstruction.  Stable lateral and third ventricles. Post craniectomy CT-Unchanged right greater than left cerebellar infarcts.  No new infarction.  Unchanged narrowing of the fourth ventricle.  No evidence of ventricular obstruction or hydrocephalus. CTA head & neck unremarkable MRI  acute infarct of the cerebellum bilaterally.  negative for hemorrhage.  MRI Repeat 12/12- Interval progression of edema associated to bilateral cerebellar acute infarcts with mass effect on the fourth ventricle and low lying right cerebellar tonsil. No hydrocephalus 2D Echo EF 70-75% LDL 106 HgbA1c 5.8 VTE prophylaxis - lovenox aspirin 81 mg daily prior to admission, now on aspirin 81 and Plavix 75 DAPT. Plan for DOAC in 7-10 days given the large size of infarct.  Therapy recommendations: Inpatient rehab- [facility] rehab closer to patients home Disposition: Pending  Cerebellar Edema s/p suboccipital decompression  3% normal saline initiated on 12/12 Sodium goal is 150-155 Na 139-144-145-152->156 S/p suboccipital decompression with Dr. Zada Finders On 3% saline 75cc->50cc-> NS 50 Close neuro monitoring CT pending  Hypertension Home meds: None Stable on the high end BP <160 per neurosurgery and CCM Cleviprex off at this time On cardizem Increase cozaar 50->100 D/c ART Line  Atrial flutter - new diagnosis Cardiology on board IV Cardizem changed to p.o. 60 mg Q8 As needed metoprolol for rate control  Fever Leukocytosis T-max 101.5 WBC  13.3->13.6 CXR 12/13 - Left lower lobe consolidation with small adjacent left pleural effusion. Findings are likely either due to pneumonia or aspiration. On unasyn   Hyperlipidemia Home meds: Atorvastatin 80 mg, resumed in hospital LDL 106, goal < 70 Atorvastatin 80 mg daily Continue statin at discharge  Delirium/agitation Encephalopathy Sundowning On precedex Will consider seroquel if QTc allows Could be due to increased ICP vs. Fever/aspiration pneumonia Repeat CT pending to rule out hydrocephalus  Other Stroke Risk Factors Advanced Age >/= 29  Obesity, Body mass index is 31.03 kg/m., BMI >/= 30 associated with increased stroke risk, recommend weight loss, diet and exercise  as appropriate  Coronary artery disease s/p CABG  Other Active Problems Abdominal discomfort CTA abdomen pelvis-no evidence of active GI bleed.  Moderate aortic atherosclerosis.  Diverticulosis with no evidence of diverticulitis. Internal hemorrhoid Nonobstructive calculus in right interpole Restart Flomax prior to removing Foley catheter Hematuria one month ago  Hospital day # 3  Patient seen and examined by NP/APP with MD. MD to update note as needed.   [place] , MSN, AGACNP-BC [facility] See Amion for schedule and pager information 11/28/2021 4:36 PM  ATTENDING NOTE: I reviewed above note and agree with the assessment and plan. Pt was seen and examined.   Wife at bedside.  Patient awake alert, sitting in bed, still has intermittent paraphasic errors, however able to name and repeat, follow simple commands.  Right gaze palsy improved, but still not complete.  Still has mild right arm drift and leg weakness.  Mild dysarthria and dysmetria BL extremities.  On 3% saline, Na 156, switch to normal saline.    Developed fever this afternoon, continued leukocytosis WBC 13.66 yesterday showed left lower lobe consolidation, put on Unasyn for possible aspiration.  Per report,  patient overnight delirium, agitated, put on Precedex.  We will repeat CT to rule out hydrocephalus.  EKG to rule out QT pronation, will consider Seroquel if QT allows.  Continue DAPT and statin.  Close neuro monitoring.  For detailed assessment and plan, please refer to above as I have made changes wherever appropriate.   Rosalin Hawking, MD PhD Stroke Neurology 11/28/2021 6:06 PM  This patient is critically ill due to large cerebellar infarct, brainstem compression, status post occipital acute compression, delirium and a flutter and at significant risk of neurological worsening, death form brain herniation, brainstem compression, obstructive hydrocephalus, heart failure, sepsis. This patient's care requires constant monitoring of vital signs, hemodynamics, respiratory and cardiac monitoring, review of multiple databases, neurological assessment, discussion with family, other specialists and medical decision making of high complexity. I spent 40 minutes of neurocritical care time in the care of this patient. I had long discussion with wife at bedside, updated pt current condition, treatment plan and potential prognosis, and answered all the questions.  She expressed understanding and appreciation.        To contact Stroke Continuity provider, please refer to [email or web address] After hours, contact General Neurology

## 2021-11-28 NOTE — TOC Progression Note (Signed)
Transition of Care [facility]) - Progression Note    Patient Details  Name: [name] MRN: [id number] Date of Birth: 09/23/1946  Transition of Care [facility]) CM/SW Contact  Oren Section Cleta Alberts, RN Phone Number: 11/28/2021, 5:18 PM  Clinical Narrative:    Updated clinical and therapy notes faxed to [facility] at [facility].  Will follow up with him in AM to check eligibility and bed availability.     Expected Discharge Plan: IP Rehab Facility Barriers to Discharge: Continued Medical Work up  Expected Discharge Plan and Services Expected Discharge Plan: [place]   Discharge Planning Services: CM Consult Post Acute Care Choice: IP Rehab Living arrangements for the past 2 months: Single Family Home                                       Social Determinants of Health (SDOH) Interventions    Readmission Risk Interventions No flowsheet data found.  Reinaldo Raddle, RN, BSN  Trauma/Neuro ICU Case Manager [phone]

## 2021-11-28 NOTE — Progress Notes (Signed)
Speech Language Pathology Treatment: Dysphagia;Cognitive-Linquistic  Patient Details Name: [name] MRN: [id number] DOB: 1946-03-09 Today's Date: 11/28/2021 Time: 3790-2409 SLP Time Calculation (min) (ACUTE ONLY): 19 min  Assessment / Plan / Recommendation Clinical Impression  Pt demonstrates increased lethargy, dysarthria and now aphasia today. RN reported that pt has been observed to cough with nectar thick liquids unless full supervision given for slow rate and sustained attention due to increased lethargy. SLP awake pt from sleep positioned upright and provided a cup of strawberry ensure with 6 nectar thick packets added, making the texture close to honey thick. Pt was able to sip with a straw independently with occasional throat clearing. Pts vocal quality is intermittently wet from secretions at baseline as well. He is able to follow cues for throat clearing as needed as instructed his wife to assist in this. Pt still able to masticate adequately when alert, but he is not very interested in food. Provided wife with a magic cup to feed him. SLP also reassessed word finding as he has been observed to continue to be fluent but with frequent phonemic paraphasias and perseverations on words. Pt scored 1/8 correct in a picture naming task though he previously scored 7/8. He is able to be cued to recognize errors and correct with phonemic cues, sematic cues and question cues. Discussed with wife and provided aphasia education booklet. Pt to down grade to honey thick liquids and regular solids. RN still very concerned about stress and BP, not appropriate for FEES or travel to [facility] at this time.   HPI HPI: 75 yo male with stroke risk factors of CAD and HLD. Stat MRI showed bilateral cerebellar infarcts and pt noted to have developed ataxia. Patient was given TNK as he was still in the window. On repeat exam, prior to TNK, his NIHSS was 2 for right sided ataxia. Pt has suffered from nausea and vomiting  impacting success with [place] with current plan of care      Recommendations for follow up therapy are one component of a multi-disciplinary discharge planning process, led by the attending physician.  Recommendations may be updated based on patient status, additional functional criteria and insurance authorization.    Recommendations  Diet recommendations: Honey-thick liquid;Regular Liquids provided via: Cup;Straw Medication Administration: Whole meds with liquid Supervision: Staff to assist with self feeding;Full supervision/cueing for compensatory strategies Compensations: Slow rate;Small sips/bites Postural Changes and/or Swallow Maneuvers: Seated upright 90 degrees                Oral Care Recommendations: Oral care BID Follow Up Recommendations: Acute inpatient rehab (3hours/day) Assistance recommended at discharge: Frequent or constant Supervision/Assistance SLP Visit Diagnosis: Dysarthria and anarthria (R47.1);Dysphagia, unspecified (R13.10);Aphasia (R47.01) Plan: Continue with current plan of care           Amarilis Belflower, Katherene Ponto  11/28/2021, 10:56 AM

## 2021-11-28 NOTE — Progress Notes (Signed)
NAME:  [name], MRN:  [id number], DOB:  Jan 05, 1946, LOS: 3 ADMISSION DATE:  11/25/2021, CONSULTATION DATE: 11/27/21 REFERRING MD:  Marcelle Overlie, CHIEF COMPLAINT:   stroke symptoms  History of Present Illness:  [name] is a 75 yo man with hx of CAD, s/p CABG HLD, OA, nephrolithiasis admitted on 12/11 with ataxia, n/v, vertigo, weakness, found to have cerebellar stroke. Hypertensive on arrival.  Given TNKase. New afib mentioned in admission note as well - converted back to sinus. Developed increasing edema with mass effect on 4th ventricle and low lying cerebellar tonsil.  Increasing drowsiness.   S/p suboccipital craniectomy, remained intubated after surgery.  Pertinent  Medical History  CAD, s/p CABG  HLD  OA,  Nephrolithiasis BPH  Prostate cancer   Significant Hospital Events: Including procedures, antibiotic start and stop dates in addition to other pertinent events     Interim History / Subjective:  Patient continued to spike fever with T-max 101.5 He has been aspirating with thin liquids, will ask speech therapy to reevaluate for swallowing  Objective   Blood pressure (!) 111/57, pulse (!) 57, temperature (!) 101.5 F (38.6 C), temperature source Oral, resp. rate (!) 32, height 5\' 10"  (1.778 m), weight 98.1 kg, SpO2 (!) 89 %.        Intake/Output Summary (Last 24 hours) at 11/28/2021 0845 Last data filed at 11/28/2021 0400 Gross per 24 hour  Intake 2041.87 ml  Output 4200 ml  Net -2158.13 ml   Filed Weights   11/25/21 1200  Weight: 98.1 kg    Examination: General: Acutely ill-appearing male, lying on the bed HEENT: [place]/AT, eyes anicteric.  moist mucus membranes Neuro: Alert, awake following commands, perseverating, aphasic, antigravity in all 4 extremities.  Dysmetria bilaterally, unable to check the gait Chest: Coarse breath sounds, no wheezes or rhonchi Heart: Regular rate and rhythm, no murmurs or gallops Abdomen: Soft, nontender, nondistended, bowel sounds  present Skin: No rash    Resolved Hospital Problem list    Assessment & Plan:  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 and compression of ventral pons s/p posterior decompression craniectomy Dysphagia Acute hypoxic respiratory failure, improved Aspiration pneumonia  Chronic HFpEF Hypertension Hypokalemia Hypocalcemia  Neurosurgery and neurology is following Continue secondary stroke prophylaxis Continue 3% saline, closely monitor serum sodium, currently at goal Patient was successfully extubated yesterday Currently on room air Respiratory cultures pending Continue Unasyn Monitor intake and output Swallow evaluation Continue home antihypertensive meds Continue aggressive electrolyte supplement    Labs   CBC: Recent Labs  Lab 11/25/21 1020 11/25/21 1029 11/26/21 0512 11/27/21 0309 11/27/21 0311 11/28/21 0331  WBC 6.8  --  13.3*  --  12.1* 13.6*  NEUTROABS 3.3  --   --   --   --   --   HGB 16.6 16.7 15.6 14.3 14.3 14.6  HCT 48.7 49.0 45.5 42.0 43.0 43.0  MCV 90.7  --  89.6  --  92.1 91.5  PLT 223  --  232  --  217 465    Basic Metabolic Panel: Recent Labs  Lab 11/25/21 1020 11/25/21 1029 11/26/21 0512 11/26/21 1624 11/27/21 0309 11/27/21 0311 11/27/21 1059 11/27/21 1636 11/28/21 0331  NA 139 140 138   < > 144 142 145 147* 152*  K 3.8 3.6 3.8  --  3.8 3.7  --   --  3.2*  CL 105 108 105  --   --  112*  --   --  121*  CO2 22  --  22  --   --  22  --   --  24  GLUCOSE 134* 135* 117*  --   --  131*  --   --  124*  BUN 18 21 15   --   --  17  --   --  15  CREATININE 1.02 0.90 0.83  --   --  0.89  --   --  1.12  CALCIUM 9.1  --  8.7*  --   --  7.8*  --   --  7.9*   < > = values in this interval not displayed.   GFR: Estimated Creatinine Clearance: 67.9 mL/min (by C-G formula based on SCr of 1.12 mg/dL). Recent Labs  Lab 11/25/21 1020 11/26/21 0512 11/27/21 0311 11/28/21 0331  WBC 6.8 13.3* 12.1* 13.6*    Liver Function  Tests: Recent Labs  Lab 11/25/21 1020  AST 31  ALT 29  ALKPHOS 86  BILITOT 1.2  PROT 6.9  ALBUMIN 3.9   No results for input(s): LIPASE, AMYLASE in the last 168 hours. No results for input(s): AMMONIA in the last 168 hours.  ABG    Component Value Date/Time   PHART 7.391 11/27/2021 0309   PCO2ART 38.7 11/27/2021 0309   PO2ART 212 (H) 11/27/2021 0309   HCO3 23.6 11/27/2021 0309   TCO2 25 11/27/2021 0309   ACIDBASEDEF 1.0 11/27/2021 0309   O2SAT 100.0 11/27/2021 0309     Coagulation Profile: Recent Labs  Lab 11/25/21 1020  INR 1.0    Cardiac Enzymes: No results for input(s): CKTOTAL, CKMB, CKMBINDEX, TROPONINI in the last 168 hours.  HbA1C: Hgb A1c MFr Bld  Date/Time Value Ref Range Status  11/26/2021 05:12 AM 5.8 (H) 4.8 - 5.6 % Final    Comment:    (NOTE) Pre diabetes:          5.7%-6.4%  Diabetes:              >6.4%  Glycemic control for   <7.0% adults with diabetes     CBG: Recent Labs  Lab 11/25/21 1022 11/25/21 1025  GLUCAP 132* 141*    Critical care time:     Total critical care time: 32 minutes  Performed by: [place] care time was exclusive of separately billable procedures and treating other patients.   Critical care was necessary to treat or prevent imminent or life-threatening deterioration.   Critical care was time spent personally by me on the following activities: development of treatment plan with patient and/or surrogate as well as nursing, discussions with consultants, evaluation of patient's response to treatment, examination of patient, obtaining history from patient or surrogate, ordering and performing treatments and interventions, ordering and review of laboratory studies, ordering and review of radiographic studies, pulse oximetry and re-evaluation of patient's condition.   Jacky Kindle MD [facility] See Amion for pager If no response to pager, please call [phone] until 7pm After  7pm, Please call E-link [phone]

## 2021-11-28 NOTE — Progress Notes (Signed)
[facility] Progress Note Patient Name: Trask Vosler DOB: 13-Jan-1946 MRN: [id number]   Date of Service  11/28/2021  HPI/Events of Note  Latest CT scan result indicates hemorrhage into the infarct without increase in mass effect,  eICU Interventions  Bedside RN requested to call and update the neurologist covering tonight.        Kerry Kass Qamar Aughenbaugh 11/28/2021, 11:54 PM

## 2021-11-28 NOTE — Anesthesia Postprocedure Evaluation (Signed)
Anesthesia Post Note  Patient: [name]  Procedure(s) Performed: SUBOCCIPITAL CRANIECTOMY (Head)     Patient location during evaluation: SICU Anesthesia Type: General Level of consciousness: sedated Pain management: pain level controlled Vital Signs Assessment: post-procedure vital signs reviewed and stable Respiratory status: patient remains intubated per anesthesia plan Cardiovascular status: stable Postop Assessment: no apparent nausea or vomiting Anesthetic complications: no   No notable events documented.              Effie Berkshire

## 2021-11-28 NOTE — Progress Notes (Signed)
Occupational Therapy Treatment Patient Details Name: [name] MRN: [id number] DOB: 1946-03-07 Today's Date: 11/28/2021   History of present illness 75 yo male presenting to ED on 12/11 with ataxia and vertigo. 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 TNK given at 1214. PMH including CAD s/p CABG x4, HLD, OA, nephrolithiasis, urinary retention, BPH with LUTS and prostate cancer.   OT comments  Patient with incremental progress toward patient focused goals.  Nursing able to decrease sedation, but patient with noted increased impulsivity and restlessness.  Patient with generalized complaints of pain, but no specific location.  Patient able to take steps and practice transfers this date with up to Mod A of 2.  Light grooming seated edge of bed and Lower body ADL with Max A.  Deficits impacting independence are listed below.  AIR continues to be recommended based on age and ability to progress with a more aggressive multi disciplined post acute rehab approach.     Recommendations for follow up therapy are one component of a multi-disciplinary discharge planning process, led by the attending physician.  Recommendations may be updated based on patient status, additional functional criteria and insurance authorization.    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    Transfers Overall transfer level: Needs assistance Equipment used: 2 person hand held assist Transfers: Sit to/from Stand;Bed to chair/wheelchair/BSC Sit to Stand: Mod assist;+2 physical assistance;+2 safety/equipment   Step pivot transfers: Mod assist;+2 physical assistance             Balance Overall balance assessment: Needs assistance Sitting-balance support: No upper extremity supported;Feet supported Sitting balance-Leahy Scale: Poor     Standing balance support: Bilateral upper extremity supported;During functional activity Standing balance-Leahy Scale: Poor                             ADL either performed or assessed with clinical judgement   ADL       Grooming: Moderate assistance;Sitting           Upper Body Dressing : Moderate assistance;Sitting   Lower Body Dressing: Maximal assistance;Bed level                      Extremity/Trunk Assessment Upper Extremity Assessment Upper Extremity Assessment: RUE deficits/detail RUE Deficits / Details: Decreased coordination and active use of R UE.  Weakness. RUE Coordination: decreased fine motor;decreased gross motor   Lower Extremity Assessment Lower Extremity Assessment: Defer to PT evaluation   Cervical / Trunk Assessment Cervical / Trunk Assessment: Normal                      Cognition Arousal/Alertness: Awake/alert Behavior During Therapy: Impulsive Overall Cognitive Status: Impaired/Different from baseline Area of Impairment: Following commands;Safety/judgement;Awareness;Problem solving;Attention  Current Attention Level: Sustained   Following Commands: Follows one step commands with increased time Safety/Judgement: Decreased awareness of safety;Decreased awareness of deficits Awareness: Intellectual Problem Solving: Slow processing;Requires verbal  cues;Difficulty sequencing General Comments: increased agitation                            Pertinent Vitals/ Pain       Pain Assessment: Faces Faces Pain Scale: Hurts even more Facial Expression: Tense Body Movements: Restlessness Muscle Tension: Tense, rigid Compliance with ventilator (intubated pts.): N/A Vocalization (extubated pts.): Sighing, moaning CPOT Total: 5 Pain Location: generalized with no location specified Pain Descriptors / Indicators: Discomfort;Grimacing;Pressure Pain Intervention(s): Monitored during session                                                          Frequency  Min 2X/week        Progress Toward Goals  OT Goals(current goals can now be found in the care plan section)  Progress towards OT goals: Progressing toward goals  Acute Rehab OT Goals OT Goal Formulation: Patient unable to participate in goal setting Time For Goal Achievement: 12/10/21 Potential to Achieve Goals: Good  Plan Discharge plan remains appropriate    Co-evaluation    PT/OT/SLP Co-Evaluation/Treatment: Yes Reason for Co-Treatment: Complexity of the patient's impairments (multi-system involvement) PT goals addressed during session: Mobility/safety with mobility;Balance OT goals addressed during session: ADL's and self-care      AM-PAC OT "6 Clicks" Daily Activity     Outcome Measure   Help from another person eating meals?: A Lot Help from another person taking care of personal grooming?: A Lot Help from another person toileting, which includes using toliet, bedpan, or urinal?: A Lot Help from another person bathing (including washing, rinsing, drying)?: A Lot Help from another person to put on and taking off regular upper body clothing?: A Lot Help from another person to put on and taking off regular lower body clothing?: A Lot 6 Click Score: 12    End of Session Equipment Utilized During Treatment: Gait belt  OT Visit  Diagnosis: Unsteadiness on feet (R26.81);Other abnormalities of gait and mobility (R26.89);Muscle weakness (generalized) (M62.81);History of falling (Z91.81)   Activity Tolerance Patient tolerated treatment well   Patient Left in bed;with call bell/phone within reach;with bed alarm set   Nurse Communication          Time: 1353-1416 OT Time Calculation (min): 23 min  Charges: OT General Charges $OT Visit: 1 Visit OT Treatments $Self Care/Home Management : 8-22 mins  11/28/2021  RP, OTR/L  Acute Rehabilitation Services  Office:  [phone]   Metta Clines 11/28/2021, 3:44 PM

## 2021-11-28 NOTE — Progress Notes (Signed)
Received consult for PIV placement for hypertonic infusion. Secure chat regarding consult with Dr. Tamala Julian, Dr. Tacy Learn and Joelene Millin RN. Recommended that hypertonic infusion is not appropriate for an USGPIV or midline. Recommended CVC. Fran Lowes, RN VAST

## 2021-11-28 NOTE — Progress Notes (Signed)
Inpatient Rehabilitation Admissions Coordinator   I met with patient with his wife at bedside. Wife prefers [facility] due to proximity to their home and social supports. I met with them in case [facility] bed is not available. We discussed goals and expectations of a possible CIR admit . I feel he is a great candidate for an AIR/CIR Program. I will follow his progress at a distance.  Danne Baxter, RN, MSN Rehab Admissions Coordinator [phone] 11/28/2021 11:32 AM

## 2021-11-28 NOTE — Progress Notes (Signed)
Physical Therapy Treatment Patient Details Name: [name] MRN: [id number] DOB: June 26, 1946 Today's Date: 11/28/2021   History of Present Illness 75 yo male presenting to ED on 12/11 with ataxia and vertigo. 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 TNK given at 1214. S/p suboccipital crani for evacuation on 12/13. PMH including CAD s/p CABG x4, HLD, OA, nephrolithiasis, urinary retention, BPH with LUTS and prostate cancer.    PT Comments    Patient making small progress towards physical therapy goals. Patient impulsive and restless throughout session, requiring frequent cues for redirection. Patient requires modA+2 for sit to stand and step pivot transfer. Patient with ataxia and functional L LE weakness with difficulty advancing during transfer. Patient with more word finding difficulty this session and perseveration on certain words/phrases. Continue to recommend CIR level therapies to assist with maximizing functional mobility and safety.     Recommendations for follow up therapy are one component of a multi-disciplinary discharge planning process, led by the attending physician.  Recommendations may be updated based on patient status, additional functional criteria and insurance authorization.  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    Transfers Overall transfer level: Needs assistance Equipment used: 2 person hand held assist Transfers: Sit to/from Stand;Bed to chair/wheelchair/BSC Sit to Stand: Mod assist;+2 physical assistance;+2 safety/equipment     Step pivot transfers: Mod assist;+2 physical assistance     General transfer comment: modA+2 to rise and steady from recliner with posterior lean initially. ModA+2 to complete step pivot transfer to bed with cues for advancing L LE. Continues to demo ataxia    Ambulation/Gait                   Stairs             Wheelchair Mobility    Modified Rankin (Stroke Patients Only) Modified Rankin (Stroke Patients Only) Pre-Morbid Rankin Score: No symptoms Modified Rankin: Moderately severe disability     Balance Overall balance assessment: Needs assistance Sitting-balance support: No upper extremity supported;Feet supported Sitting balance-Leahy Scale: Poor     Standing balance support: Bilateral upper extremity supported;During functional activity Standing balance-Leahy Scale: Poor                              Cognition Arousal/Alertness: Awake/alert Behavior During Therapy: Impulsive Overall Cognitive Status: Impaired/Different from baseline Area of Impairment: Following commands;Safety/judgement;Awareness;Problem solving;Attention                   Current Attention Level: Sustained   Following Commands: Follows one step commands with increased time Safety/Judgement: Decreased awareness of safety;Decreased awareness of deficits Awareness: Intellectual Problem Solving: Slow processing;Requires verbal cues;Difficulty sequencing General Comments: increased agitation this  date with impulsive behaviors attempting to return to bed prior to PT/OT  preparation. Requires cues to stop and wait as well as slowing breathing.        Exercises      General Comments General comments (skin integrity, edema, etc.): VSS on 5L O2 [place]      Pertinent Vitals/Pain Pain Assessment: Faces Faces Pain Scale: Hurts even more Facial Expression: Tense Body Movements: Restlessness Muscle Tension: Tense, rigid Compliance with ventilator (intubated pts.): N/A Vocalization (extubated pts.): Sighing, moaning CPOT Total: 5 Pain Location: generalized with no location specified Pain Descriptors / Indicators: Discomfort;Grimacing;Pressure Pain Intervention(s): Limited activity within patient's tolerance;Monitored during session;Repositioned    Home Living                          Prior Function            PT Goals (current goals can now be found in the care plan section) Acute Rehab PT Goals Patient Stated Goal: help me. PT Goal Formulation: With patient/family Time For Goal Achievement: 12/10/21 Potential to Achieve Goals: Good Progress towards PT goals: Progressing toward goals    Frequency    Min 4X/week      PT Plan Current plan remains appropriate    Co-evaluation PT/OT/SLP Co-Evaluation/Treatment: Yes Reason for Co-Treatment: Complexity of the patient's impairments (multi-system involvement);For patient/therapist safety;To address functional/ADL transfers;Necessary to address cognition/behavior during functional activity PT goals addressed during session: Mobility/safety with mobility;Balance OT goals addressed during session: ADL's and self-care      AM-PAC PT "6 Clicks" Mobility   Outcome Measure  Help needed turning from your back to your side while in a flat bed without using bedrails?: A Little Help needed moving from lying on your back to sitting on the side of a flat bed without using bedrails?: Total Help needed moving to and from a bed to a chair (including a wheelchair)?: Total Help needed standing up  from a chair using your arms (e.g., wheelchair or bedside chair)?: Total Help needed to walk in hospital room?: Total Help needed climbing 3-5 steps with a railing? : Total 6 Click Score: 8    End of Session Equipment Utilized During Treatment: Gait belt Activity Tolerance: Patient tolerated treatment well Patient left: in bed;with call bell/phone within reach;with bed alarm set;with nursing/sitter in room Nurse Communication: Mobility status PT Visit Diagnosis: Unsteadiness on feet (R26.81);Difficulty in walking, not elsewhere classified (R26.2);Ataxic gait (R26.0)     Time: 1353-1416 PT Time Calculation (min) (ACUTE ONLY): 23 min  Charges:  $Therapeutic Activity: 8-22 mins                     Cisco Kindt A. Gilford Rile PT, DPT Acute Rehabilitation Services Pager [phone] Office [phone]    [name] 11/28/2021, 4:03 PM

## 2021-11-29 ENCOUNTER — Inpatient Hospital Stay (HOSPITAL_COMMUNITY): Payer: Medicare Other

## 2021-11-29 DIAGNOSIS — I63443 Cerebral infarction due to embolism of bilateral cerebellar arteries: Secondary | ICD-10-CM | POA: Diagnosis not present

## 2021-11-29 DIAGNOSIS — I639 Cerebral infarction, unspecified: Secondary | ICD-10-CM | POA: Diagnosis not present

## 2021-11-29 DIAGNOSIS — I4892 Unspecified atrial flutter: Secondary | ICD-10-CM | POA: Diagnosis not present

## 2021-11-29 DIAGNOSIS — R1312 Dysphagia, oropharyngeal phase: Secondary | ICD-10-CM

## 2021-11-29 DIAGNOSIS — G935 Compression of brain: Secondary | ICD-10-CM | POA: Diagnosis not present

## 2021-11-29 LAB — BASIC METABOLIC PANEL
Anion gap: 9 (ref 5–15)
BUN: 20 mg/dL (ref 8–23)
CO2: 22 mmol/L (ref 22–32)
Calcium: 8.1 mg/dL — ABNORMAL LOW (ref 8.9–10.3)
Chloride: 127 mmol/L — ABNORMAL HIGH (ref 98–111)
Creatinine, Ser: 1.08 mg/dL (ref 0.61–1.24)
GFR, Estimated: >60 mL/min (ref >60)
Glucose, Bld: 109 mg/dL — ABNORMAL HIGH (ref 70–99)
Potassium: 3.4 mmol/L — ABNORMAL LOW (ref 3.5–5.1)
Sodium: 158 mmol/L — ABNORMAL HIGH (ref 135–145)

## 2021-11-29 LAB — CBC
HCT: 38.9 % — ABNORMAL LOW (ref 39.0–52.0)
Hemoglobin: 12.7 g/dL — ABNORMAL LOW (ref 13.0–17.0)
MCH: 30.1 pg (ref 26.0–34.0)
MCHC: 32.6 g/dL (ref 30.0–36.0)
MCV: 92.2 fL (ref 80.0–100.0)
Platelets: 187 10*3/uL (ref 150–400)
RBC: 4.22 MIL/uL (ref 4.22–5.81)
RDW: 15 % (ref 11.5–15.5)
WBC: 10 10*3/uL (ref 4.0–10.5)
nRBC: 0 % (ref 0.0–0.2)

## 2021-11-29 LAB — SODIUM
Sodium: 158 mmol/L — ABNORMAL HIGH (ref 135–145)
Sodium: 155 mmol/L — ABNORMAL HIGH (ref 135–145)
Sodium: 154 mmol/L — ABNORMAL HIGH (ref 135–145)

## 2021-11-29 IMAGING — DX DG CHEST 1V PORT
1 series · 1 of 1 positions shown · non-contrast
Comparison: [DATE].

CLINICAL DATA: Acute respiratory failure.

EXAM:
PORTABLE CHEST 1 VIEW

[chest ap]
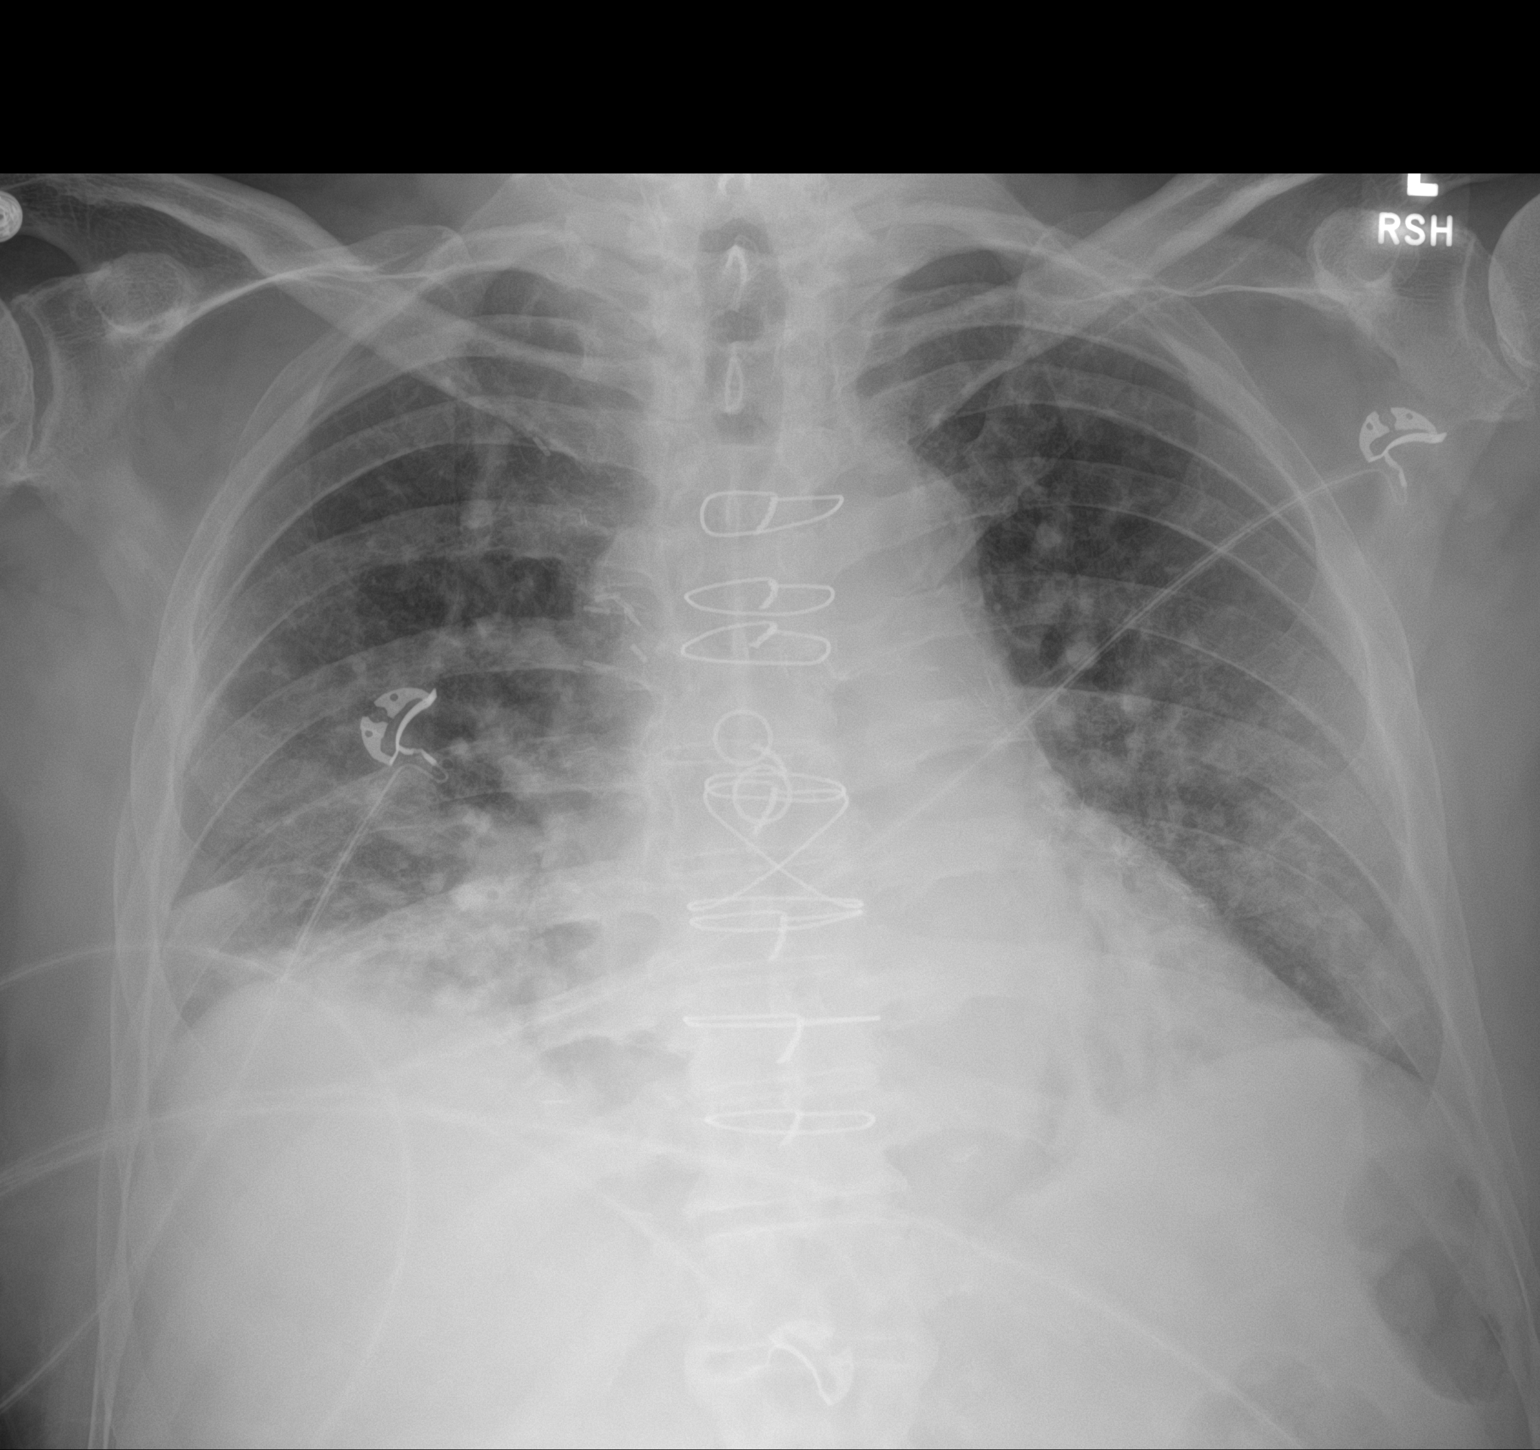

[1 of 1 positions shown; findings below may reference images not displayed]

FINDINGS: Stable cardiomediastinal silhouette. Status post coronary bypass
graft. Endotracheal tube has been removed. Mild central pulmonary
vascular congestion is noted. Mild bibasilar subsegmental
atelectasis or edema is noted with small right pleural effusion.
Bony thorax is unremarkable.
IMPRESSION: Mild bibasilar subsegmental atelectasis or edema is noted with small
right pleural effusion.

## 2021-11-29 MED ORDER — VANCOMYCIN HCL 2000 MG/400ML IV SOLN
2000.0000 mg | Freq: Once | INTRAVENOUS | Status: AC
Start: 2021-11-29 — End: 2021-11-29
  Administered 2021-11-29: 2000 mg via INTRAVENOUS
  Filled 2021-11-29: qty 400

## 2021-11-29 MED ORDER — VANCOMYCIN HCL 1250 MG/250ML IV SOLN
1250.0000 mg | INTRAVENOUS | Status: DC
  Administered 2021-11-30: 1250 mg via INTRAVENOUS
  Filled 2021-11-29: qty 250

## 2021-11-29 MED ORDER — HALOPERIDOL LACTATE 5 MG/ML IJ SOLN
1.0000 mg | INTRAMUSCULAR | Status: AC | PRN
  Administered 2021-11-30: 1 mg via INTRAVENOUS
  Filled 2021-11-29: qty 1

## 2021-11-29 MED ORDER — VANCOMYCIN HCL 750 MG/150ML IV SOLN: 750.0000 mg | Freq: Three times a day (TID) | INTRAVENOUS | Status: DC

## 2021-11-29 MED ORDER — HYDRALAZINE HCL 20 MG/ML IJ SOLN
10.0000 mg | INTRAMUSCULAR | Status: DC | PRN
  Administered 2021-11-29 – 2021-12-11 (×10): 10 mg via INTRAVENOUS
  Filled 2021-11-29 (×10): qty 1

## 2021-11-29 MED ORDER — SODIUM CHLORIDE 0.9 % IV SOLN: INTRAVENOUS | Status: DC

## 2021-11-29 MED ORDER — POTASSIUM CHLORIDE 10 MEQ/100ML IV SOLN
10.0000 meq | INTRAVENOUS | Status: AC
  Administered 2021-11-29 (×4): 10 meq via INTRAVENOUS
  Filled 2021-11-29 (×4): qty 100

## 2021-11-29 MED ORDER — POTASSIUM CHLORIDE 20 MEQ PO PACK: 40.0000 meq | PACK | Freq: Once | ORAL | Status: DC

## 2021-11-29 MED ORDER — CLEVIDIPINE BUTYRATE 0.5 MG/ML IV EMUL
0.0000 mg/h | INTRAVENOUS | Status: DC
  Administered 2021-11-29: 20 mg/h via INTRAVENOUS
  Administered 2021-11-29: 01:00:00 16 mg/h via INTRAVENOUS
  Administered 2021-11-29: 01:00:00 1 mg/h via INTRAVENOUS
  Administered 2021-11-29: 16:00:00 21 mg/h via INTRAVENOUS
  Administered 2021-11-29: 20 mg/h via INTRAVENOUS
  Administered 2021-11-29: 13:00:00 16 mg/h via INTRAVENOUS
  Administered 2021-11-29: 20 mg/h via INTRAVENOUS
  Administered 2021-11-29: 21 mg/h via INTRAVENOUS
  Administered 2021-11-29 – 2021-11-30 (×2): 16 mg/h via INTRAVENOUS
  Administered 2021-11-30: 20 mg/h via INTRAVENOUS
  Administered 2021-11-30: 16 mg/h via INTRAVENOUS
  Administered 2021-11-30: 18 mg/h via INTRAVENOUS
  Administered 2021-11-30 (×2): 20 mg/h via INTRAVENOUS
  Administered 2021-11-30: 16 mg/h via INTRAVENOUS
  Administered 2021-11-30 – 2021-12-01 (×2): 14 mg/h via INTRAVENOUS
  Administered 2021-12-01: 15 mg/h via INTRAVENOUS
  Administered 2021-12-01: 10 mg/h via INTRAVENOUS
  Administered 2021-12-01: 13 mg/h via INTRAVENOUS
  Administered 2021-12-01: 16 mg/h via INTRAVENOUS
  Administered 2021-12-01: 02:00:00 12 mg/h via INTRAVENOUS
  Administered 2021-12-02: 13:00:00 13 mg/h via INTRAVENOUS
  Administered 2021-12-02: 06:00:00 14 mg/h via INTRAVENOUS
  Administered 2021-12-02: 05:00:00 12 mg/h via INTRAVENOUS
  Filled 2021-11-29 (×26): qty 100

## 2021-11-29 NOTE — Progress Notes (Signed)
Neurosurgery Service Progress Note  Subjective: No acute events overnight, had some agitation and dysarthria prompting rpt CTH  Objective: Vitals:   11/29/21 0650 11/29/21 0700 11/29/21 0730 11/29/21 0800  BP: (!) 125/52 (!) 124/57 (!) 122/52 (!) 120/52  Pulse:  (!) 52 (!) 54 (!) 55  Resp:  14 15 (!) 27  Temp:    99.8 F (37.7 C)  TempSrc:    Axillary  SpO2:  97% 97% 99%  Weight:      Height:        Physical Exam: Awake/alert, dysarthric, speech fluent with decreased overall output this morning, Fcx4 with 5/5 on L and 4/5 on R strength, +RUE dysmetria and appendicular ataxia, incision c/d/i  Assessment & Plan: 75 y.o. man s/p suboccipital decompression for stroke, post-op CTH w/ some type 1 hemorrhagic transformation.  -no change in neurosurgical plan of care, call with any questions or concerns  Judith Part  11/29/21 9:12 AM

## 2021-11-29 NOTE — Progress Notes (Signed)
[facility] Progress Note Patient Name: [name] DOB: 09-23-1946 MRN: [id number]   Date of Service  11/29/2021  HPI/Events of Note  Patient with episodic agitation resulting in pulling his iv out twice in the course of this shift. Qtc 0.43.  eICU Interventions  PRN Haldol ordered for episodic agitation.        Kerry Kass Siddh Vandeventer 11/29/2021, 11:52 PM

## 2021-11-29 NOTE — Progress Notes (Signed)
SLP Cancellation Note  Patient Details Name: Yasser Hepp MRN: [id number] DOB: 1946-05-13   Cancelled treatment:       Reason Eval/Treat Not Completed: Other (comment). Per RN pt still demonstrating signs of dysphagia with honey thick liquids. Will proceed with MBS tomorrow. MD already determined pt to stay NPO. Wrote order.    Americo Vallery, Katherene Ponto 11/29/2021, 11:58 AM

## 2021-11-29 NOTE — Progress Notes (Signed)
PT Cancellation Note  Patient Details Name: [name] MRN: [id number] DOB: 1946-05-27   Cancelled Treatment:    Reason Eval/Treat Not Completed: Other (comment) RN requesting to hold for today. Patient has been restless and agitated throughout the day and recently has settled and fallen asleep. PT will re-attempt at later date.   [name] PT, DPT Acute Rehabilitation Services Pager [phone] Office [phone]    [name] 11/29/2021, 4:43 PM

## 2021-11-29 NOTE — TOC CAGE-AID Note (Signed)
Transition of Care [facility]) - CAGE-AID Screening   Patient Details  Name: [name] MRN: [id number] Date of Birth: 01-Jun-1946  Transition of Care [facility]) CM/SW Contact:    Edis Huish C Pham, [place] Phone Number: 11/29/2021, 9:39 AM   Clinical Narrative: Pt is unable to participate in Cage Aid.  [name], MSW, LCSW-A Pronouns:  She/Her/Hers [place] Transitions of Care Clinical Social Worker Direct Number:  [phone] [email or web address]    CAGE-AID Screening: Substance Abuse Screening unable to be completed due to: : Patient unable to participate             Substance Abuse Education Offered: No

## 2021-11-29 NOTE — Progress Notes (Addendum)
STROKE TEAM PROGRESS NOTE  INTERVAL HISTORY Patient is seen in his room with his wife [name] at the bedside.  He had an episode of agitation overnight with dysarthria prompting repeat CT head, which revealed hemorrhagic transformation of his stroke.  He has been hemodynamically stable and his neurological exam is stable with continued aphasia.  Vitals:   11/29/21 0700 11/29/21 0730 11/29/21 0800 11/29/21 1200  BP: (!) 124/57 (!) 122/52 (!) 120/52   Pulse: (!) 52 (!) 54 (!) 55   Resp: 14 15 (!) 27   Temp:   99.8 F (37.7 C) (!) 102 F (38.9 C)  TempSrc:   Axillary Axillary  SpO2: 97% 97% 99%   Weight:      Height:       CBC:  Recent Labs  Lab 11/25/21 1020 11/25/21 1029 11/28/21 0331 11/29/21 0617  WBC 6.8   < > 13.6* 10.0  NEUTROABS 3.3  --   --   --   HGB 16.6   < > 14.6 12.7*  HCT 48.7   < > 43.0 38.9*  MCV 90.7   < > 91.5 92.2  PLT 223   < > 195 187   < > = values in this interval not displayed.    Basic Metabolic Panel:  Recent Labs  Lab 11/28/21 0331 11/28/21 1002 11/29/21 0617 11/29/21 1106  NA 152*   < > 158* 158*  K 3.2*  --  3.4*  --   CL 121*  --  127*  --   CO2 24  --  22  --   GLUCOSE 124*  --  109*  --   BUN 15  --  20  --   CREATININE 1.12  --  1.08  --   CALCIUM 7.9*  --  8.1*  --    < > = values in this interval not displayed.    Lipid Panel:  Recent Labs  Lab 11/26/21 0512  CHOL 164  TRIG 52  HDL 48  CHOLHDL 3.4  VLDL 10  LDLCALC 106*    HgbA1c:  Recent Labs  Lab 11/26/21 0512  HGBA1C 5.8*    Urine Drug Screen:  Recent Labs  Lab 11/25/21 1646  LABOPIA NONE DETECTED  COCAINSCRNUR NONE DETECTED  LABBENZ NONE DETECTED  AMPHETMU NONE DETECTED  THCU NONE DETECTED  LABBARB NONE DETECTED     Alcohol Level No results for input(s): ETH in the last 168 hours.  IMAGING past 24 hours CT HEAD WO CONTRAST (5MM)  Result Date: 11/28/2021 CLINICAL DATA:  Stroke, follow-up EXAM: CT HEAD WITHOUT CONTRAST TECHNIQUE: Contiguous axial  images were obtained from the base of the skull through the vertex without intravenous contrast. COMPARISON:  11/27/2021 FINDINGS: Brain: Redemonstrated hypodensity in the right greater than left cerebellum, with new areas of hyperdensity in the right cerebellum, concerning for hemorrhage. There is grossly unchanged mass effect on the fourth ventricle, without enlargement of the lateral or third ventricles to suggest hydrocephalus. No new area of infarction is identified. Vascular: No hyperdense vessel. Skull: Status post suboccipital craniotomy. Sinuses/Orbits: No acute finding. Other: The mastoids are well aerated. IMPRESSION: New hyperdensity in the right cerebellar infarct, most likely hemorrhage. No evidence of increased mass effect, with unchanged compression of the fourth ventricle without resulting hydrocephalus. These results will be called to the ordering clinician or representative by the Radiologist Assistant, and communication documented in the PACS or [employer]. Electronically Signed   By: Merilyn Baba M.D.   On: 11/28/2021  23:16   DG CHEST PORT 1 VIEW  Result Date: 11/29/2021 CLINICAL DATA:  Acute respiratory failure. EXAM: PORTABLE CHEST 1 VIEW COMPARISON:  November 27, 2021. FINDINGS: Stable cardiomediastinal silhouette. Status post coronary bypass graft. Endotracheal tube has been removed. Mild central pulmonary vascular congestion is noted. Mild bibasilar subsegmental atelectasis or edema is noted with small right pleural effusion. Bony thorax is unremarkable. IMPRESSION: Mild bibasilar subsegmental atelectasis or edema is noted with small right pleural effusion. Electronically Signed   By: Marijo Conception M.D.   On: 11/29/2021 11:14    PHYSICAL EXAM  Temp:  [99.8 F (37.7 C)-102 F (38.9 C)] 102 F (38.9 C) (12/15 1200) Pulse Rate:  [48-84] 55 (12/15 0800) Resp:  [12-37] 27 (12/15 0800) BP: (111-226)/(42-130) 120/52 (12/15 0800) SpO2:  [91 %-99 %] 99 % (12/15  0800)  General - Well nourished, well developed, in no apparent distress.  Cardiovascular - Regular rhythm and rate.  Respiratory- respirations even and unlabored   NEURO:  Mental Status: AA&Ox3  Speech/Language: speech with some aphasia, patient will speak in words and short phrases.  He will often have difficulty finding the right word and may repeat a single word as an answer to multiple questions.  Speech is slightly improved today.  Horizontal nystagmus continues.  Patient appears to respond better to objects and examiners presented on his left side.   ASSESSMENT/PLAN [name] is a 75 y.o. male with history of CABG x4, hyperlipidemia, prostate cancer, internal hemorrhoid, and a nonobstructive calculus in right interpole with hematuria presenting with acute onset vertigo nausea vomiting and an inability to walk.  At 10 AM on November 25, 2021 patient developed vertigo nausea and vomiting.  He also noted abdominal discomfort.  A code stroke was called on his arrival to triage.  Once the patient was in the CT scanner he developed more nausea and vomiting he went to a stat MRI which was positive for bilateral cerebellar strokes.  He then developed ataxia in his bilateral upper and lower extremities.  He was then given TNKase and went for a stat CTA of the head.  No LVO was shown on the CTA. Repeat MRI showed progression of edema related to the cerebellar infarcts with mass effect on the 4th ventricle. Neurosurgery consulted and hypertonic saline initiated. 12/13 suboccipital craniectomy overnight.  Patient tolerated procedure well.  Speech therapy recommends resuming nectar thickened liquids with his diet.  Repeat CT 12/14 shows hemorrhagic transformation of patient's stroke.  Lovenox and Plavix d/c'd.  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  ASPECTS 10.    Repeat CT-extensive swelling in the areas of the acute cerebellar infarction more extensive on the right than the left.  Mass-effect on the fourth ventricle.  No evidence of ventricular obstruction.  Stable lateral and third ventricles. Post craniectomy CT-Unchanged right greater than left cerebellar infarcts.  No new infarction.  Unchanged narrowing of the fourth ventricle.  No evidence of ventricular obstruction or hydrocephalus. Repeat CT 12/14 New hyperdensity in right cerebellar infarct, likely hemorrhage with no increased mass effect. CTA head & neck unremarkable MRI  acute infarct of the cerebellum bilaterally.  negative for hemorrhage.  MRI Repeat 12/12- Interval progression of edema associated to bilateral cerebellar acute infarcts with mass effect on the fourth ventricle and low lying right cerebellar tonsil. No hydrocephalus CT head 12/14 -right cerebellum hemorrhagic transformation without hydrocephalus 2D  Echo EF 70-75% LDL 106 HgbA1c 5.8 VTE prophylaxis - SCDs aspirin 81 mg daily prior to admission, now on aspirin 81, lovenox and plavix discontinued due to hemorrhagic transformation of cerebellar stroke. Will consider DOAC in 14 days post stroke if neuro stable Therapy recommendations: Inpatient rehab- [facility] rehab closer to patients home Disposition: Pending  Cerebellar Edema s/p suboccipital decompression  3% normal saline initiated on 12/12 Sodium goal is 150-155 Na 139-144-145-152->156-> 158 S/p suboccipital decompression with Dr. Zada Finders On 3% saline 75cc->50cc-> NS 50->75cc Close neuro monitoring CT 12/13 reveals unchanged infarcts without evidence of hydrocephalus CT head 12/14 -right cerebellum hemorrhagic transformation without hydrocephalus  Hypertension Home meds: None Stable on the high end BP <160 per neurosurgery and CCM Cleviprex off at this time On cardizem Increase cozaar 50->100 D/c ART Line  Atrial flutter - new diagnosis Cardiology  on board IV Cardizem changed to p.o. 60 mg Q8 As needed metoprolol for rate control Consider DOAC in 14 days post stroke if neuro stable  Fever Leukocytosis T-max 101.5->101.3 WBC 13.3->13.6->10.0 CXR 12/13 - Left lower lobe consolidation with small adjacent left pleural effusion. Findings are likely either due to pneumonia or aspiration. On unasyn   Hyperlipidemia Home meds: Atorvastatin 80 mg, resumed in hospital LDL 106, goal < 70 Atorvastatin 80 mg daily Continue statin at discharge  Delirium/agitation Encephalopathy Sundowning On precedex On seroquel 25 Qhs Could be due to increased ICP vs. Fever/aspiration pneumonia Repeat CT reveals no hydrocephalus  Dysphagia NPO for now PO foods and medications held due to concern for aspiration MBSS tomorrow  Other Stroke Risk Factors Advanced Age >/= 33  Obesity, Body mass index is 31.03 kg/m., BMI >/= 30 associated with increased stroke risk, recommend weight loss, diet and exercise as appropriate  Coronary artery disease s/p CABG  Other Active Problems Abdominal discomfort CTA abdomen pelvis-no evidence of active GI bleed.  Moderate aortic atherosclerosis.  Diverticulosis with no evidence of diverticulitis. Internal hemorrhoid Nonobstructive calculus in right interpole Restart Flomax prior to removing Foley catheter Hematuria one month ago Hypokalemia 3.4 -> supplment via [facility] day # 4  Patient seen and examined by NP/APP with MD. MD to update note as needed.   [place] , MSN, AGACNP-BC [facility] See Amion for schedule and pager information 11/29/2021 2:19 PM  ATTENDING NOTE: I reviewed above note and agree with the assessment and plan. Pt was seen and examined.   Wife at bedside.  Patient awake alert, mildly lethargic, fluctuating mental status, intermittently oriented to place and people, but not to age or time.  Able to name and repeat, intermittent word salad and incoherence.   Mild dysarthria.  Moving all extremities, right arm weakness improved.  Overnight patient continued to have intermittent fever, coughing, diet on hold, currently n.p.o., pending barium study tomorrow.  Continue IV fluid.  CT repeat showed right cerebellar hemorrhagic conversion, no hydrocephalus.  Continue aspirin 81, DC Plavix and Lovenox subcu.  Leukocytosis improved, continue Unasyn. Na 155, off 3% saline.  Continue close monitoring.  For detailed assessment and plan, please refer to above as I have made changes wherever appropriate.   This patient is critically ill due to large cerebellar infarct, brainstem compression, hemorrhagic conversion, encephalopathy, a flutter and at significant risk of neurological worsening, death form recurrent stroke, hemorrhagic conversion, brainstem compression, hydrocephalus, heart failure, sepsis. This patient's care requires constant monitoring of vital signs, hemodynamics, respiratory and cardiac monitoring, review of multiple databases, neurological assessment, discussion with family, other  specialists and medical decision making of high complexity. I spent 45 minutes of neurocritical care time in the care of this patient. I had long discussion with wife at bedside, updated pt current condition, treatment plan and potential prognosis, and answered all the questions.  She expressed understanding and appreciation.    Rosalin Hawking, MD PhD Stroke Neurology 11/29/2021 6:23 PM   To contact Stroke Continuity provider, please refer to [email or web address] After hours, contact General Neurology

## 2021-11-29 NOTE — Progress Notes (Signed)
Pharmacy Antibiotic Note  [name] is a 75 y.o. male admitted on 11/25/2021 presenting as code stroke, intubated and concern for aspiration pna now with fever on Unasyn. Plans are to add vancomycin  Plan: -vancomycin 2gm IV x1 followed by 1250 mg IV q24h; estimated AUC= 464 . -Will follow renal function, cultures and clinical progress   Height: 5\' 10"  (177.8 cm) Weight: 98.1 kg (216 lb 4.3 oz) IBW/kg (Calculated) : 73  Temp (24hrs), Avg:101.1 F (38.4 C), Min:100.6 F (38.1 C), Max:101.5 F (38.6 C)  Recent Labs  Lab 11/25/21 1020 11/25/21 1029 11/26/21 0512 11/27/21 0311 11/28/21 0331  WBC 6.8  --  13.3* 12.1* 13.6*  CREATININE 1.02 0.90 0.83 0.89 1.12     Estimated Creatinine Clearance: 67.9 mL/min (by C-G formula based on SCr of 1.12 mg/dL).    Not on File  Bertis Ruddy, PharmD Clinical Pharmacist ED Pharmacist Phone # [phone] 11/29/2021 4:23 AM

## 2021-11-29 NOTE — Progress Notes (Signed)
Pharmacy Antibiotic Note  [name] is a 75 y.o. male admitted on 11/25/2021 presenting as code stroke, intubated and concern for aspiration pna now with fever on Unasyn. Plans are to add vancomycin  Plan: -vancomycin 2gm IV x1 followed by 1250 mg IV q24h; estimated AUC= 464 . -Will follow renal function, cultures and clinical progress   Height: 5\' 10"  (177.8 cm) Weight: 98.1 kg (216 lb 4.3 oz) IBW/kg (Calculated) : 73  Temp (24hrs), Avg:101.1 F (38.4 C), Min:100.6 F (38.1 C), Max:101.5 F (38.6 C)  Recent Labs  Lab 11/25/21 1020 11/25/21 1029 11/26/21 0512 11/27/21 0311 11/28/21 0331  WBC 6.8  --  13.3* 12.1* 13.6*  CREATININE 1.02 0.90 0.83 0.89 1.12     Estimated Creatinine Clearance: 67.9 mL/min (by C-G formula based on SCr of 1.12 mg/dL).      Hildred Laser, PharmD Clinical Pharmacist **Pharmacist phone directory can now be found on [email or web address] ([email or web address]).  Listed under [place].

## 2021-11-29 NOTE — Progress Notes (Signed)
NAME:  [name], MRN:  [id number], DOB:  [birth year], LOS: 4 ADMISSION DATE:  11/25/2021, CONSULTATION DATE: 11/27/21 REFERRING MD:  Marcelle Overlie, CHIEF COMPLAINT:   stroke symptoms  History of Present Illness:  [name] is a 75 yo man with hx of CAD, s/p CABG HLD, OA, nephrolithiasis admitted on 12/11 with ataxia, n/v, vertigo, weakness, found to have cerebellar stroke. Hypertensive on arrival.  Given TNKase. New afib mentioned in admission note as well - converted back to sinus. Developed increasing edema with mass effect on 4th ventricle and low lying cerebellar tonsil.  Increasing drowsiness.   S/p suboccipital craniectomy, remained intubated after surgery.  Pertinent  Medical History  CAD, s/p CABG  HLD  OA,  Nephrolithiasis BPH  Prostate cancer   Significant Hospital Events: Including procedures, antibiotic start and stop dates in addition to other pertinent events     Interim History / Subjective:  Patient's fever curve is trending down after starting Unasyn and vancomycin Last night with agitated became hypotensive and altered Stat head CT was done which showed hemorrhagic conversion of right cerebellar infarct  Objective   Blood pressure (!) 120/52, pulse (!) 55, temperature 99.8 F (37.7 C), temperature source Axillary, resp. rate (!) 27, height 5\' 10"  (1.778 m), weight 98.1 kg, SpO2 99 %.        Intake/Output Summary (Last 24 hours) at 11/29/2021 1116 Last data filed at 11/29/2021 0800 Gross per 24 hour  Intake 2645.03 ml  Output 3300 ml  Net -654.97 ml   Filed Weights   11/25/21 1200  Weight: 98.1 kg    Examination: General: Acutely ill-appearing male, lying on the bed HEENT: [place]/AT, eyes anicteric.  moist mucus membranes Neuro: Alert, awake following commands, dysarthric, word finding difficulty, antigravity in all 4 extremities.  Dysmetria bilaterally, unable to check the gait Chest: Coarse breath sounds, no wheezes or rhonchi Heart: Regular rate and  rhythm, no murmurs or gallops Abdomen: Soft, nontender, nondistended, bowel sounds present Skin: No rash    Resolved Hospital Problem list   Acute hypoxic respiratory failure  Assessment & Plan:  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 and compression of ventral pons s/p posterior decompression craniectomy Hemorrhagic conversion of right cerebellar infarct Dysphagia Aspiration pneumonia  Chronic HFpEF Hypertension, uncontrolled Induced hypernatremia Hypokalemia Hypocalcemia  Neurosurgery and neurology is following After repeat head CT which showed hemorrhagic conversion of right cerebellar infarct, neurosurgery recommended no surgical intervention Continue clevidipine with SBP goal <140 Continue secondary stroke prophylaxis Plavix and subcu Lovenox was stopped by neurology Continue aspirin 3% saline is kept on hold as serum sodium went up to 158 Closely monitor serum sodium with goal 150-155 Patient's fever curve is trending down after addition of vancomycin to IV Unasyn Respiratory culture is pending Currently on room air Monitor intake and output Repeat swallow evaluation is requested as patient is coughing when he drinks water or coffee Continue losartan and diltiazem Continue aggressive electrolyte supplement Holding off on free water considering cerebral edema   Labs   CBC: Recent Labs  Lab 11/25/21 1020 11/25/21 1029 11/26/21 0512 11/27/21 0309 11/27/21 0311 11/28/21 0331 11/29/21 0617  WBC 6.8  --  13.3*  --  12.1* 13.6* 10.0  NEUTROABS 3.3  --   --   --   --   --   --   HGB 16.6   < > 15.6 14.3 14.3 14.6 12.7*  HCT 48.7   < > 45.5 42.0 43.0 43.0 38.9*  MCV 90.7  --  89.6  --  92.1 91.5 92.2  PLT 223  --  232  --  217 195 187   < > = values in this interval not displayed.    Basic Metabolic Panel: Recent Labs  Lab 11/25/21 1020 11/25/21 1029 11/26/21 0512 11/26/21 1624 11/27/21 0309 11/27/21 0311 11/27/21 1059 11/28/21 0331  11/28/21 1002 11/28/21 1620 11/28/21 2232 11/29/21 0617  NA 139 140 138   < > 144 142   < > 152* 155* 156* 157* 158*  K 3.8 3.6 3.8  --  3.8 3.7  --  3.2*  --   --   --  3.4*  CL 105 108 105  --   --  112*  --  121*  --   --   --  127*  CO2 22  --  22  --   --  22  --  24  --   --   --  22  GLUCOSE 134* 135* 117*  --   --  131*  --  124*  --   --   --  109*  BUN 18 21 15   --   --  17  --  15  --   --   --  20  CREATININE 1.02 0.90 0.83  --   --  0.89  --  1.12  --   --   --  1.08  CALCIUM 9.1  --  8.7*  --   --  7.8*  --  7.9*  --   --   --  8.1*   < > = values in this interval not displayed.   GFR: Estimated Creatinine Clearance: 70.4 mL/min (by C-G formula based on SCr of 1.08 mg/dL). Recent Labs  Lab 11/26/21 0512 11/27/21 0311 11/28/21 0331 11/29/21 0617  WBC 13.3* 12.1* 13.6* 10.0    Liver Function Tests: Recent Labs  Lab 11/25/21 1020  AST 31  ALT 29  ALKPHOS 86  BILITOT 1.2  PROT 6.9  ALBUMIN 3.9   No results for input(s): LIPASE, AMYLASE in the last 168 hours. No results for input(s): AMMONIA in the last 168 hours.  ABG    Component Value Date/Time   PHART 7.391 11/27/2021 0309   PCO2ART 38.7 11/27/2021 0309   PO2ART 212 (H) 11/27/2021 0309   HCO3 23.6 11/27/2021 0309   TCO2 25 11/27/2021 0309   ACIDBASEDEF 1.0 11/27/2021 0309   O2SAT 100.0 11/27/2021 0309     Coagulation Profile: Recent Labs  Lab 11/25/21 1020  INR 1.0    Cardiac Enzymes: No results for input(s): CKTOTAL, CKMB, CKMBINDEX, TROPONINI in the last 168 hours.  HbA1C: Hgb A1c MFr Bld  Date/Time Value Ref Range Status  11/26/2021 05:12 AM 5.8 (H) 4.8 - 5.6 % Final    Comment:    (NOTE) Pre diabetes:          5.7%-6.4%  Diabetes:              >6.4%  Glycemic control for   <7.0% adults with diabetes     CBG: Recent Labs  Lab 11/25/21 1022 11/25/21 1025  GLUCAP 132* 141*    Critical care time:     Total critical care time: 34 minutes  Performed by: [place] care time was exclusive of separately billable procedures and treating other patients.   Critical care was necessary to treat or prevent imminent or life-threatening deterioration.   Critical care was time spent personally by me  on the following activities: development of treatment plan with patient and/or surrogate as well as nursing, discussions with consultants, evaluation of patient's response to treatment, examination of patient, obtaining history from patient or surrogate, ordering and performing treatments and interventions, ordering and review of laboratory studies, ordering and review of radiographic studies, pulse oximetry and re-evaluation of patient's condition.   Jacky Kindle MD [facility] See Amion for pager If no response to pager, please call [phone] until 7pm After 7pm, Please call E-link [phone]

## 2021-11-30 ENCOUNTER — Inpatient Hospital Stay (HOSPITAL_COMMUNITY): Payer: Medicare Other

## 2021-11-30 DIAGNOSIS — I160 Hypertensive urgency: Secondary | ICD-10-CM

## 2021-11-30 DIAGNOSIS — R509 Fever, unspecified: Secondary | ICD-10-CM | POA: Diagnosis not present

## 2021-11-30 DIAGNOSIS — I4892 Unspecified atrial flutter: Secondary | ICD-10-CM | POA: Diagnosis not present

## 2021-11-30 DIAGNOSIS — I63531 Cerebral infarction due to unspecified occlusion or stenosis of right posterior cerebral artery: Secondary | ICD-10-CM

## 2021-11-30 DIAGNOSIS — G9341 Metabolic encephalopathy: Secondary | ICD-10-CM

## 2021-11-30 LAB — GLUCOSE, CAPILLARY
Glucose-Capillary: 107 mg/dL — ABNORMAL HIGH (ref 70–99)
Glucose-Capillary: 115 mg/dL — ABNORMAL HIGH (ref 70–99)
Glucose-Capillary: 115 mg/dL — ABNORMAL HIGH (ref 70–99)

## 2021-11-30 LAB — CBC
HCT: 45.4 % (ref 39.0–52.0)
Hemoglobin: 15.6 g/dL (ref 13.0–17.0)
MCH: 31.4 pg (ref 26.0–34.0)
MCHC: 34.4 g/dL (ref 30.0–36.0)
MCV: 91.3 fL (ref 80.0–100.0)
Platelets: 211 10*3/uL (ref 150–400)
RBC: 4.97 MIL/uL (ref 4.22–5.81)
RDW: 14.8 % (ref 11.5–15.5)
WBC: 9.9 10*3/uL (ref 4.0–10.5)
nRBC: 0.2 % (ref 0.0–0.2)

## 2021-11-30 LAB — EXPECTORATED SPUTUM ASSESSMENT W GRAM STAIN, RFLX TO RESP C

## 2021-11-30 LAB — BASIC METABOLIC PANEL
Anion gap: 10 (ref 5–15)
BUN: 19 mg/dL (ref 8–23)
CO2: 19 mmol/L — ABNORMAL LOW (ref 22–32)
Calcium: 7.9 mg/dL — ABNORMAL LOW (ref 8.9–10.3)
Chloride: 123 mmol/L — ABNORMAL HIGH (ref 98–111)
Creatinine, Ser: 0.96 mg/dL (ref 0.61–1.24)
GFR, Estimated: >60 mL/min (ref >60)
Glucose, Bld: 103 mg/dL — ABNORMAL HIGH (ref 70–99)
Potassium: 4.3 mmol/L (ref 3.5–5.1)
Sodium: 152 mmol/L — ABNORMAL HIGH (ref 135–145)

## 2021-11-30 LAB — PHOSPHORUS
Phosphorus: 2.3 mg/dL — ABNORMAL LOW (ref 2.5–4.6)
Phosphorus: 2.5 mg/dL (ref 2.5–4.6)

## 2021-11-30 LAB — SODIUM
Sodium: 154 mmol/L — ABNORMAL HIGH (ref 135–145)
Sodium: 147 mmol/L — ABNORMAL HIGH (ref 135–145)
Sodium: 147 mmol/L — ABNORMAL HIGH (ref 135–145)

## 2021-11-30 LAB — MAGNESIUM
Magnesium: 2.6 mg/dL — ABNORMAL HIGH (ref 1.7–2.4)
Magnesium: 2.8 mg/dL — ABNORMAL HIGH (ref 1.7–2.4)

## 2021-11-30 MED ORDER — BETHANECHOL CHLORIDE 10 MG PO TABS
10.0000 mg | ORAL_TABLET | Freq: Three times a day (TID) | ORAL | Status: DC
  Administered 2021-11-30 – 2021-12-25 (×74): 10 mg
  Filled 2021-11-30 (×74): qty 1

## 2021-11-30 MED ORDER — SENNOSIDES-DOCUSATE SODIUM 8.6-50 MG PO TABS
2.0000 | ORAL_TABLET | Freq: Every day | ORAL | Status: DC
  Administered 2021-11-30 – 2021-12-02 (×3): 2
  Filled 2021-11-30 (×3): qty 2

## 2021-11-30 MED ORDER — HYDRALAZINE HCL 25 MG PO TABS
25.0000 mg | ORAL_TABLET | Freq: Three times a day (TID) | ORAL | Status: DC
Start: 2021-11-30 — End: 2021-12-02
  Administered 2021-11-30 – 2021-12-02 (×5): 25 mg
  Filled 2021-11-30 (×5): qty 1

## 2021-11-30 MED ORDER — ATORVASTATIN CALCIUM 80 MG PO TABS
80.0000 mg | ORAL_TABLET | Freq: Every day | ORAL | Status: DC
  Administered 2021-12-01 – 2021-12-25 (×25): 80 mg
  Filled 2021-11-30 (×25): qty 1

## 2021-11-30 MED ORDER — PANTOPRAZOLE 2 MG/ML SUSPENSION
40.0000 mg | Freq: Every day | ORAL | Status: DC
  Administered 2021-11-30 – 2021-12-25 (×26): 40 mg
  Filled 2021-11-30 (×26): qty 20

## 2021-11-30 MED ORDER — BUTALBITAL-APAP-CAFFEINE 50-325-40 MG PO TABS: 1.0000 | ORAL_TABLET | Freq: Three times a day (TID) | ORAL | Status: DC | PRN

## 2021-11-30 MED ORDER — SODIUM CHLORIDE 3 % IV SOLN
INTRAVENOUS | Status: DC
  Filled 2021-11-30 (×2): qty 500

## 2021-11-30 MED ORDER — QUETIAPINE FUMARATE 25 MG PO TABS
50.0000 mg | ORAL_TABLET | Freq: Every day | ORAL | Status: DC
  Administered 2021-11-30 – 2021-12-01 (×2): 50 mg
  Filled 2021-11-30 (×2): qty 2

## 2021-11-30 MED ORDER — QUETIAPINE FUMARATE 25 MG PO TABS: 50.0000 mg | ORAL_TABLET | Freq: Every day | ORAL | Status: DC

## 2021-11-30 MED ORDER — SODIUM CHLORIDE 3 % IV BOLUS
250.0000 mL | Freq: Once | INTRAVENOUS | Status: AC
  Administered 2021-11-30: 250 mL via INTRAVENOUS
  Filled 2021-11-30: qty 500

## 2021-11-30 MED ORDER — OSMOLITE 1.5 CAL PO LIQD
1000.0000 mL | ORAL | Status: AC
  Administered 2021-11-30 – 2021-12-04 (×3): 1000 mL

## 2021-11-30 MED ORDER — PROSOURCE TF PO LIQD
90.0000 mL | Freq: Two times a day (BID) | ORAL | Status: DC
  Administered 2021-11-30 – 2021-12-25 (×50): 90 mL
  Filled 2021-11-30 (×51): qty 90

## 2021-11-30 MED ORDER — DILTIAZEM HCL 60 MG PO TABS
60.0000 mg | ORAL_TABLET | Freq: Three times a day (TID) | ORAL | Status: DC
Start: 2021-11-30 — End: 2021-12-05
  Administered 2021-11-30 – 2021-12-04 (×12): 60 mg
  Filled 2021-11-30 (×18): qty 1

## 2021-11-30 MED ORDER — ASPIRIN 81 MG PO CHEW
81.0000 mg | CHEWABLE_TABLET | Freq: Every day | ORAL | Status: DC
  Administered 2021-12-01 – 2021-12-17 (×17): 81 mg
  Filled 2021-11-30 (×17): qty 1

## 2021-11-30 MED ORDER — LOSARTAN POTASSIUM 50 MG PO TABS
50.0000 mg | ORAL_TABLET | Freq: Two times a day (BID) | ORAL | Status: DC
  Administered 2021-11-30: 50 mg via ORAL
  Filled 2021-11-30: qty 1

## 2021-11-30 NOTE — Progress Notes (Signed)
NAME:  [name], MRN:  [id number], DOB:  [birth year], LOS: 5 ADMISSION DATE:  11/25/2021, CONSULTATION DATE:  12/13 REFERRING MD:  Marcelle Overlie, CHIEF COMPLAINT:  Stroke symptoms   History of Present Illness:  75 y/o male admitted with a cerebellar stroke on 12/11, received TNKase, he developed increasing mass effect on the 4th ventricle, moved to the ICU.  Pertinent  Medical History  CAD, s/p CABG  HLD  OA,  Nephrolithiasis BPH  Prostate cancer   Significant Hospital Events: Including procedures, antibiotic start and stop dates in addition to other pertinent events   12/11 admission, received TNKase 12/13 worsening edema/mass effect, had emergent craniotomy 12/14 hemorrhagic transformation of stroke   Culture 12/11 sars cov 2/flu > negative 12/14 blood  12/15 resp culture > gram positive rods  Abx 12/12 cefazolin x1 12/13 unasyn > 12/14 vanc >  Imaging 12/11 MRI brain > acute bilateral cerebellar infarct 12/11 CT angiogram ab/pelv > no evidence of GI bleeding, diverticulosis  12/12 MRI brain > interval progression of edema associated to to bilateral cerebellar acute infarcts with mass effect on 4th ventricle and low lying R cerebellar tonsil, no hydro 12/12 CT head > low density swelling in areas of cerebellar infarct R>L, mass effect on 4th vent no hydro 12/13 CT head > s/p interval subocciptal decompression with unchanged R > L cerebellar infarcts and new infarction, unchanged narrowing of 4th ventricle, no hydro 12/14 CT head > new hyperdensity in R cerebellar infarct due to hemorrhage, no mass effect, unchanged compression of 4th ventricul wihtout hydrocephalus  Interim History / Subjective:  Received haldol overnight because of periods of severe agitation, pulling at tubes, lines. Remains on precedex For SLP evaluation later today, planning modified barium swallow   Objective   Blood pressure 133/70, pulse 67, temperature 99.3 F (37.4 C), temperature source  Axillary, resp. rate (!) 25, height 5\' 10"  (1.778 m), weight 98.1 kg, SpO2 93 %.        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 dn compression of ventral pons s/p posterior decompression with craniectomy Hemorrhagic conversion of R cerebellar infarct PT/OT following Will need rehab post hospital, looking at [facility] inpatient rehab Holding 3% saline Repeat imaging per neuro  Dysphagia Aspiration Modified barium swallow today if fails then Cor track today ASA, no plavix  Right lower lobe pneumonia Atelectasis vs R pleural effusion, CXR looks more like atelectasis to me Unasyn Bedside ultrasound if WBC up over weekend  Chronic HFpER Hypertension CAD Cleviprex Goal SBP < 140 Losartan Lipitor  Induced hypernatremia Hypokalemia Hypocalcemia Monitor BMET and UOP Replace electrolytes as needed  Acute metabolic encephalopathy Precedex Seroquel Prn haldol  Atrial flutter Diltiazem Tele  BPH flomax   Best Practice (right click and "Reselect all SmartList Selections" daily)   Diet/type: NPO DVT prophylaxis: SCD GI prophylaxis: PPI Lines: N/A Foley:  N/A Code Status:  full code  Last date of multidisciplinary goals of care discussion [12/16 with Somaya Grassi: OK with CPR and short term intubation but would not want prolonged life support.  Would reconsider need for mechanical  ventilation if no improvement after 5-7 days.]  Labs   CBC: Recent Labs  Lab 11/25/21 1020 11/25/21 1029 11/26/21 0512 11/27/21 0309 11/27/21 0311 11/28/21 0331 11/29/21 0617 11/30/21 0409  WBC 6.8  --  13.3*  --  12.1* 13.6* 10.0 9.9  NEUTROABS 3.3  --   --   --   --   --   --   --   HGB 16.6   < > 15.6 14.3 14.3 14.6 12.7* 15.6  HCT 48.7   < > 45.5 42.0 43.0 43.0 38.9* 45.4  MCV 90.7  --  89.6  --  92.1 91.5 92.2 91.3  PLT 223  --  232  --  217 195 187 211   < > = values in this interval not displayed.    Basic Metabolic Panel: Recent Labs  Lab 11/26/21 0512 11/26/21 1624 11/27/21 0309 11/27/21 0311 11/27/21 1059 11/28/21 0331 11/28/21 1002 11/29/21 0617 11/29/21 1106 11/29/21 1612 11/29/21 2254 11/30/21 0409  NA 138   < > 144 142   < > 152*   < > 158* 158* 155* 154* 152*  K 3.8  --  3.8 3.7  --  3.2*  --  3.4*  --   --   --  4.3  CL 105  --   --  112*  --  121*  --  127*  --   --   --  123*  CO2 22  --   --  22  --  24  --  22  --   --   --  19*  GLUCOSE 117*  --   --  131*  --  124*  --  109*  --   --   --  103*  BUN 15  --   --  17  --  15  --  20  --   --   --  19  CREATININE 0.83  --   --  0.89  --  1.12  --  1.08  --   --   --  0.96  CALCIUM 8.7*  --   --  7.8*  --  7.9*  --  8.1*  --   --   --  7.9*   < > = values in this interval not displayed.   GFR: Estimated Creatinine Clearance: 79.3 mL/min (by C-G formula based on SCr of 0.96 mg/dL). Recent Labs  Lab 11/27/21 0311 11/28/21 0331 11/29/21 0617 11/30/21 0409  WBC 12.1* 13.6* 10.0 9.9    Liver Function Tests: Recent Labs  Lab 11/25/21 1020  AST 31  ALT 29  ALKPHOS 86  BILITOT 1.2  PROT 6.9  ALBUMIN 3.9   No results for input(s): LIPASE, AMYLASE in the last 168 hours. No results for input(s): AMMONIA in the last 168 hours.  ABG    Component Value Date/Time   PHART 7.391 11/27/2021 0309   PCO2ART 38.7 11/27/2021 0309   PO2ART 212 (H) 11/27/2021 0309   HCO3 23.6 11/27/2021 0309    TCO2 25 11/27/2021 0309   ACIDBASEDEF 1.0 11/27/2021 0309   O2SAT 100.0 11/27/2021 0309     Coagulation Profile: Recent Labs  Lab 11/25/21 1020  INR 1.0    Cardiac Enzymes: No results for input(s): CKTOTAL, CKMB, CKMBINDEX, TROPONINI in the last  168 hours.  HbA1C: Hgb A1c MFr Bld  Date/Time Value Ref Range Status  11/26/2021 05:12 AM 5.8 (H) 4.8 - 5.6 % Final    Comment:    (NOTE) Pre diabetes:          5.7%-6.4%  Diabetes:              >6.4%  Glycemic control for   <7.0% adults with diabetes     CBG: Recent Labs  Lab 11/25/21 1022 11/25/21 1025  GLUCAP 132* 141*    Critical care time: 35 minutes    Roselie Awkward, MD [facility] Pager: [phone] Cell: [phone] After 7:00 pm call Elink  [phone]

## 2021-11-30 NOTE — Progress Notes (Addendum)
STROKE TEAM PROGRESS NOTE   INTERVAL HISTORY Patient is seen in his room with his wife [name] at the bedside.  He had another episode of agitation overnight and received one dose of haldol. He was made NPO yesterday for concerns of aspiration PNA and had not received his PO medications. Plans for modified barium swallow today. Order cortrak if he does not pass. Patient is relatively communicative today, he does need some prompting with his speech to say his age but is able to communicate effectively. Plan to increase seroquel tonight and decrease cleviprex and precedex as appropriate.   Vitals:   11/30/21 0600 11/30/21 0700 11/30/21 0800 11/30/21 0900  BP: 135/64 133/70 138/64 (!) 142/64  Pulse: 60 67 64 66  Resp: (!) 26 (!) 25 (!) 22 (!) 27  Temp:      TempSrc:      SpO2: 92% 93% 93% 92%  Weight:      Height:       CBC:  Recent Labs  Lab 11/25/21 1020 11/25/21 1029 11/29/21 0617 11/30/21 0409  WBC 6.8   < > 10.0 9.9  NEUTROABS 3.3  --   --   --   HGB 16.6   < > 12.7* 15.6  HCT 48.7   < > 38.9* 45.4  MCV 90.7   < > 92.2 91.3  PLT 223   < > 187 211   < > = values in this interval not displayed.    Basic Metabolic Panel:  Recent Labs  Lab 11/29/21 0617 11/29/21 1106 11/29/21 2254 11/30/21 0409  NA 158*   < > 154* 152*  K 3.4*  --   --  4.3  CL 127*  --   --  123*  CO2 22  --   --  19*  GLUCOSE 109*  --   --  103*  BUN 20  --   --  19  CREATININE 1.08  --   --  0.96  CALCIUM 8.1*  --   --  7.9*   < > = values in this interval not displayed.    Lipid Panel:  Recent Labs  Lab 11/26/21 0512  CHOL 164  TRIG 52  HDL 48  CHOLHDL 3.4  VLDL 10  LDLCALC 106*    HgbA1c:  Recent Labs  Lab 11/26/21 0512  HGBA1C 5.8*    Urine Drug Screen:  Recent Labs  Lab 11/25/21 1646  LABOPIA NONE DETECTED  COCAINSCRNUR NONE DETECTED  LABBENZ NONE DETECTED  AMPHETMU NONE DETECTED  THCU NONE DETECTED  LABBARB NONE DETECTED     Alcohol Level No results for input(s):  ETH in the last 168 hours.  IMAGING past 24 hours No results found.  PHYSICAL EXAM  Temp:  [98.3 F (36.8 C)-101.9 F (38.8 C)] 99.3 F (37.4 C) (12/16 0400) Pulse Rate:  [55-82] 66 (12/16 0900) Resp:  [14-34] 27 (12/16 0900) BP: (96-156)/(47-105) 142/64 (12/16 0900) SpO2:  [86 %-94 %] 92 % (12/16 0900)  General - Well nourished, well developed, in no apparent distress.  Cardiovascular - Regular rhythm and rate.  Respiratory- respirations even and unlabored   NEURO:  Mental Status: AA&Ox3 Speech/Language:  Able to speak short phrases, mild aphasia. Does have some difficulty finding the correct word. When asked his age he needed prompting to say 56. (He first said 68 and then 73). Patient is responding more to left side, however is also responding to people on his right now.   Cranial Nerves II - XII -  II -no visual field deficit III, IV, VI -Nystagmus.  V - Facial sensation intact bilaterally. VII - Facial movement intact bilaterally. VIII - Hearing & vestibular intact bilaterally. X - Palate elevates symmetrically. XI - Chin turning & shoulder shrug intact bilaterally. XII - Tongue protrusion intact.   Motor Strength -right upper extremity and right lower extremity slightly weaker than left. Motor Tone - Muscle tone was assessed at the neck and appendages and was normal. Reflexes - The patients reflexes were symmetrical in all extremities and he had no pathological reflexes. Sensory - Light touch, temperature/pinprick were assessed and were symmetrical.   Coordination -difficulty with rapid alternating movement Gait and Station - deferred.   ASSESSMENT/PLAN [name] is a 75 y.o. male with history of CABG x4, hyperlipidemia, prostate cancer, internal hemorrhoid, and a nonobstructive calculus in right interpole with hematuria presenting with acute onset vertigo nausea vomiting and an inability to walk.  At 10 AM on November 25, 2021 patient developed vertigo  nausea and vomiting.  He also noted abdominal discomfort.  A code stroke was called on his arrival to triage.  Once the patient was in the CT scanner he developed more nausea and vomiting he went to a stat MRI which was positive for bilateral cerebellar strokes.  He then developed ataxia in his bilateral upper and lower extremities.  He was then given TNKase and went for a stat CTA of the head.  No LVO was shown on the CTA. Repeat MRI showed progression of edema related to the cerebellar infarcts with mass effect on the 4th ventricle. Neurosurgery consulted and hypertonic saline initiated. 12/13 suboccipital craniectomy overnight.  Patient tolerated procedure well.  Speech therapy recommends resuming nectar thickened liquids with his diet.  Repeat CT 12/14 shows hemorrhagic transformation of patient's stroke.  Lovenox and Plavix d/c'd.  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 ASPECTS 10.    Repeat CT-extensive swelling in the areas of the acute cerebellar infarction more extensive on the right than the left.  Mass-effect on the fourth ventricle.  No evidence of ventricular obstruction.  Stable lateral and third ventricles. Post craniectomy CT-Unchanged right greater than left cerebellar infarcts.  No new infarction.  Unchanged narrowing of the fourth ventricle.  No evidence of ventricular obstruction or hydrocephalus. Repeat CT 12/14 New hyperdensity in right cerebellar infarct, likely hemorrhage with no increased mass effect. CTA head & neck unremarkable MRI  acute infarct of the cerebellum bilaterally.  negative for hemorrhage.  MRI Repeat 12/12- Interval progression of edema associated to bilateral cerebellar acute infarcts with mass effect on the fourth ventricle and low lying right cerebellar tonsil. No hydrocephalus CT head 12/14 -right cerebellum hemorrhagic  transformation without hydrocephalus CT repeat pending 2D Echo EF 70-75% LDL 106 HgbA1c 5.8 VTE prophylaxis - SCDs aspirin 81 mg daily prior to admission, now on aspirin 81, lovenox and plavix discontinued due to hemorrhagic transformation of cerebellar stroke. Will consider DOAC in 14 days post stroke if neuro and CT stable Therapy recommendations: Inpatient rehab- [facility] rehab closer to patients home Disposition: Pending  Cerebellar Edema s/p suboccipital decompression  3% normal saline initiated on 12/12 Sodium goal is 150-155 Na 154 S/p suboccipital decompression with Dr. Zada Finders 3% d/c'd Close neuro monitoring CT 12/13 reveals unchanged infarcts without evidence of hydrocephalus CT head 12/14 - right cerebellum hemorrhagic transformation without hydrocephalus CT repeat pending  Hypertension Home meds: None  Stable on the high end BP <160  Continue cozaar 50 bid, cardizem 60 Q8 Add hydralazine 25 Q8 Taper off Cleviprex as able  Atrial flutter - new diagnosis Cardiology on board IV Cardizem changed to p.o. 60 mg Q8 As needed metoprolol for rate control Consider DOAC in 14 days post stroke if neuro and CT stable  Fever Leukocytosis T-max 101.5->101.3-> 99.3->101.9 WBC 13.3->13.6->10.0->9.9 CXR 12/13 - Left lower lobe consolidation with small adjacent left pleural effusion. Findings are likely either due to pneumonia or aspiration. On unasyn   Hyperlipidemia Home meds: Atorvastatin 80 mg, resumed in hospital LDL 106, goal < 70 Atorvastatin 80 mg daily Continue statin at discharge  Delirium/agitation Encephalopathy Sundowning On precedex, taper off as able On seroquel 25 Qhs-> increase to 50 tonight Could be due to increased ICP vs. Fever/aspiration pneumonia Repeat CT 12/14 reveals no hydrocephalus  Dysphagia NPO for now MBSS done 12/16 [place] ordered. Patient is only able to have sparing thickened liquids currently Will start TF and meds via  cortrak  Other Stroke Risk Factors Advanced Age >/= 75  Obesity, Body mass index is 31.03 kg/m., BMI >/= 30 associated with increased stroke risk, recommend weight loss, diet and exercise as appropriate  Coronary artery disease s/p CABG  Other Active Problems Abdominal discomfort CTA abdomen pelvis-no evidence of active GI bleed.  Moderate aortic atherosclerosis.  Diverticulosis with no evidence of diverticulitis. Internal hemorrhoid Nonobstructive calculus in right interpole Restart Flomax prior to removing Foley catheter Hematuria one month ago Hypokalemia  Replaced-> 4.3  Hospital day # 5  Patient seen and examined by NP/APP with MD. MD to update note as needed.   Tyler Ores, DNP, FNP-BC [facility] Pager: [phone]  ATTENDING NOTE: I reviewed above note and agree with the assessment and plan. Pt was seen and examined.   Wife at bedside.  Patient is lying in bed, still has fever and agitation overnight.  Still has mild right upper extremity weakness and dysarthria.  Intermittent word salad and, disorientation.  Did not pass swallow, still n.p.o., put a core track for tube feeding and medication.  Continue Zosyn for presumed aspiration pneumonia.  BP on the higher side, continue Cardizem and Corgard but add hydralazine.  Taper off Cleviprex and Precedex as able.  Increase Seroquel to 50 nightly.  PT OT recommend CIR.  Repeat CT in a.m.  For detailed assessment and plan, please refer to above as I have made changes wherever appropriate.   Tyler Hawking, MD PhD Stroke Neurology 11/30/2021 5:23 PM  This patient is critically ill due to large cerebellar infarct, brainstem compression, hemorrhagic conversion, encephalopathy, a flutter and at significant risk of neurological worsening, death form recurrent stroke, hemorrhagic conversion, brainstem compression, hydrocephalus, heart failure, sepsis. This patient's care requires constant monitoring of vital signs,  hemodynamics, respiratory and cardiac monitoring, review of multiple databases, neurological assessment, discussion with family, other specialists and medical decision making of high complexity. I spent 40 minutes of neurocritical care time in the care of this patient. I had long discussion with wife at bedside, updated pt current condition, treatment plan and potential prognosis, and answered all the questions.  She expressed understanding and appreciation.        To contact Stroke Continuity provider, please refer to [email or web address] After hours, contact General Neurology

## 2021-11-30 NOTE — Progress Notes (Signed)
Modified Barium Swallow Progress Note  Patient Details  Name: [name] MRN: [id number] Date of Birth: 07-01-1946  Today's Date: 11/30/2021  Modified Barium Swallow completed.  Full report located under Chart Review in the Imaging Section.  Brief recommendations include the following:  Clinical Impression  Pt demonstates a moderate oropharyngeal dyspahgia with base of tongue propulsion particularly impacted, resulting in vallecular (eventually spilling to pyriform sinus) residue post swallow. Pt also with a slight delay in swallow initiation. Pt experienced sensed penetration and aspiration with ejection with nectar thick liquids primarily after the swallow. Residue could never be fully cleared despite positional strategies and cues for effort. Thin liquids were aspirated with sensation before and after the swallow. Pt recommended to have alternate method of nutriiton placed since his mentation has been variable and appetite rather poor. He does become agitated when NPO, so nectar thick water after oral care is allowed. SLP will f/u for therapy and trials.   Swallow Evaluation Recommendations       SLP Diet Recommendations: Nectar thick liquid;NPO   Liquid Administration via: Straw;Cup   Medication Administration: Via alternative means       Compensations: Slow rate;Small sips/bites   Postural Changes: Remain semi-upright after after feeds/meals (Comment);Seated upright at 90 degrees   Oral Care Recommendations: Oral care BID       Herbie Baltimore, MA CCC-SLP  Acute Rehabilitation Services Office [phone]  Othelia Pulling, Katherene Ponto 11/30/2021,11:36 AM

## 2021-11-30 NOTE — Progress Notes (Signed)
Occupational Therapy Treatment Patient Details Name: [name] MRN: [id number] DOB: 01/13/1946 Today's Date: 11/30/2021   History of present illness 75 yo male presenting to ED on 12/11 with ataxia and vertigo. 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 TNK given at 1214. S/p suboccipital crani for evacuation on 12/13. PMH including CAD s/p CABG x4, HLD, OA, nephrolithiasis, urinary retention, BPH with LUTS and prostate cancer.   OT comments  Patient with good progress toward all patient goals.  Patient is more alert, more appropriate and following commands better.  Deficits impacting independence remain, and are listed below.  OT to continue efforts in the acute setting, he is closer to one person assist, and should begin progressing with ADL completion going forward.  Continue to recommend AIR for intensive post acute rehab.     Recommendations for follow up therapy are one component of a multi-disciplinary discharge planning process, led by the attending physician.  Recommendations may be updated based on patient status, additional functional criteria and insurance authorization.    Follow Up Recommendations  Acute inpatient rehab (3hours/day)    Assistance Recommended at Discharge Frequent or constant Supervision/Assistance  Equipment Recommendations  BSC/3in1    Recommendations for Other Services      Precautions / Restrictions Precautions Precautions: Fall Restrictions Weight Bearing Restrictions: No       Mobility Bed Mobility Overal bed mobility: Needs Assistance Bed Mobility: Supine to Sit Rolling: Min assist Sidelying to sit: Mod assist;+2 for safety/equipment;HOB elevated Supine to sit: Mod assist;+2 for physical assistance Sit to supine: Mod assist;+2 for physical assistance   General bed mobility comments: modA+2 for all aspects of bed mobility. Patient overshooting trunk elevation requiring assist to regain balance.  Patient with difficulty sequencing and coordinating Patient Response: Anxious  Transfers Overall transfer level: Needs assistance Equipment used: 2 person hand held assist Transfers: Sit to/from Stand;Bed to chair/wheelchair/BSC Sit to Stand: Mod assist;+2 physical assistance   Step pivot transfers: Mod assist;+2 physical assistance       General transfer comment: modA+2 to rise and steady from EOB. Patient able to take pivotal steps towards recliner on L side with modA+2 for balance and sequencing.     Balance Overall balance assessment: Needs assistance Sitting-balance support: No upper extremity supported;Feet supported Sitting balance-Leahy Scale: Poor Sitting balance - Comments: mild L lateral lean. Min guard for sitting balance, at times minA   Standing balance support: Bilateral upper extremity supported;During functional activity Standing balance-Leahy Scale: Poor Standing balance comment: reliant on UE support and external assist                             Extremity/Trunk Assessment Upper Extremity Assessment RUE Deficits / Details: Decreased coordination and active use of R UE.  Weakness. RUE Sensation: WNL RUE Coordination: decreased fine motor;decreased gross motor   Lower Extremity Assessment Lower Extremity Assessment: Defer to PT evaluation   Cervical / Trunk Assessment Cervical / Trunk Assessment: Normal                      Cognition Arousal/Alertness: Awake/alert Behavior During Therapy: Impulsive Overall Cognitive Status: Impaired/Different from baseline Area of Impairment: Following commands;Safety/judgement;Awareness;Problem solving;Attention                   Current Attention Level: Sustained   Following Commands: Follows one step commands with increased time Safety/Judgement: Decreased awareness of safety;Decreased awareness of deficits  Awareness: Intellectual Problem Solving: Slow processing;Requires verbal  cues;Difficulty sequencing General Comments: cognition seems to be improved this session. Following commands with increased time and appropriately. Easily distracted requiring cues for redirection                            Pertinent Vitals/ Pain       Pain Assessment: No/denies pain Faces Pain Scale: No hurt Facial Expression: Relaxed, neutral Body Movements: Absence of movements Muscle Tension: Relaxed Compliance with ventilator (intubated pts.): N/A Vocalization (extubated pts.): Talking in normal tone or no sound CPOT Total: 0 Pain Intervention(s): Monitored during session                                                          Frequency  Min 2X/week        Progress Toward Goals  OT Goals(current goals can now be found in the care plan section)  Progress towards OT goals: Progressing toward goals  Acute Rehab OT Goals Time For Goal Achievement: 12/10/21 Potential to Achieve Goals: Good  Plan Discharge plan remains appropriate    Co-evaluation    PT/OT/SLP Co-Evaluation/Treatment: Yes Reason for Co-Treatment: Complexity of the patient's impairments (multi-system involvement);Necessary to address cognition/behavior during functional activity;To address functional/ADL transfers PT goals addressed during session: Mobility/safety with mobility;Balance;Strengthening/ROM OT goals addressed during session: ADL's and self-care      AM-PAC OT "6 Clicks" Daily Activity     Outcome Measure   Help from another person eating meals?: A Lot Help from another person taking care of personal grooming?: A Lot Help from another person toileting, which includes using toliet, bedpan, or urinal?: A Lot Help from another person bathing (including washing, rinsing, drying)?: A Lot Help from another person to put on and taking off regular upper body clothing?: A Lot Help from another person to put on and taking off regular lower body clothing?: A  Lot 6 Click Score: 12    End of Session Equipment Utilized During Treatment: Gait belt  OT Visit Diagnosis: Unsteadiness on feet (R26.81);Other abnormalities of gait and mobility (R26.89);Muscle weakness (generalized) (M62.81);History of falling (Z91.81)   Activity Tolerance     Patient Left in bed;with call bell/phone within reach;with bed alarm set   Nurse Communication Mobility status;Other (comment)        Time: 5102-5852 OT Time Calculation (min): 23 min  Charges: OT General Charges $OT Visit: 1 Visit OT Treatments $Therapeutic Activity: 8-22 mins  11/30/2021  RP, OTR/L  Acute Rehabilitation Services  Office:  [phone]   Metta Clines 11/30/2021, 4:07 PM

## 2021-11-30 NOTE — Procedures (Signed)
Cortrak  Person Inserting Tube:  [name], [name], RD Tube Type:  Cortrak - 43 inches Tube Size:  10 Tube Location:  Left nare Initial Placement:  Stomach Secured by: Bridle Technique Used to Measure Tube Placement:  Marking at nare/corner of mouth Cortrak Secured At:  65 cm  Cortrak Tube Team Note:  Consult received to place a Cortrak feeding tube.   X-ray is required, abdominal x-ray has been ordered by the Cortrak team. Please confirm tube placement before using the Cortrak tube.   If the tube becomes dislodged please keep the tube and contact the Cortrak team at [email or web address] (password [email or web address]) for replacement.  If after hours and replacement cannot be delayed, place a NG tube and confirm placement with an abdominal x-ray.     [name]., MS, RD, LDN (she/her/hers) RD pager number and weekend/on-call pager number located in [place].

## 2021-11-30 NOTE — Progress Notes (Signed)
Dr. Erlinda Hong gave order to increase BP goal to <160 at 1000 this morning (12/16) but the order was not put in until 1550. Been titrating cleviprex based on <160 goal since this morning.   Normajean Baxter, RN

## 2021-11-30 NOTE — Progress Notes (Signed)
Initial Nutrition Assessment  DOCUMENTATION CODES:   Not applicable  INTERVENTION:   Initiate tube feeding via Cortrak tube: Osmolite 1.5 at 25 ml/h and increase by 10 ml every 4 hours to goal rate of 55 ml/hr (1320 ml per day) Prosource TF 90 ml BID  Provides 2140 kcal, 126 gm protein, 1003 ml free water daily    NUTRITION DIAGNOSIS:   Inadequate oral intake related to inability to eat as evidenced by NPO status.  GOAL:   Patient will meet greater than or equal to 90% of their needs  MONITOR:   Diet advancement, TF tolerance  REASON FOR ASSESSMENT:   Rounds, Consult Enteral/tube feeding initiation and management  ASSESSMENT:   Pt with PMH of CAD s/p CABG, HLD, OA, nephrolithiasis, BPH, and prostate cancer admitted with cerebellar stroke 12/11 s/p TNK.     Pt discussed during ICU rounds and with RN. Pt agitated, failed MBS, needs cortrak tube. Per RN pt has not been eating and not had any PO meds since Tuesday.  Pt with severe agitated overnight requiring haldol.  Noted RLL PNA.  Spoke with CCM, ok to start feeding after cortrak placement today.    Spoke with pt and his wife [name] and [name]). They report that pt has not had any recent weight loss and eats 2-3 meals per day usually. Always has Breakfast and Supper. 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  Noted some muscle wasting more on L than R. Pt with hx of L hip replacement in 2020 and R knee replacement this year 03/2021.   12/11 admitted 12/13 pt with worsening edema s/p emergent crani 12/16 failed MBS, only allowed sips with awake  Medications reviewed and include: senokot-s  NS @ 75 ml/hr Cleviprex @  Precedex  Labs reviewed: Na 154 (currently off 3%)     NUTRITION - FOCUSED PHYSICAL EXAM:  Flowsheet Row Most Recent Value  Orbital Region No depletion  Upper Arm Region No depletion  Thoracic and Lumbar Region No depletion  Buccal Region No depletion   Temple Region No depletion  Clavicle Bone Region No depletion  Clavicle and Acromion Bone Region No depletion  Scapular Bone Region No depletion  Dorsal Hand No depletion  Patellar Region Mild depletion  Anterior Thigh Region Mild depletion  Posterior Calf Region Mild depletion  Edema (RD Assessment) None  Hair Reviewed  Eyes Reviewed  Mouth Reviewed  [dry tongue, contrast residue present from MBS]  Skin Reviewed  Nails Reviewed       Diet Order:   Diet Order             Diet NPO time specified Except for: Other (See Comments)  Diet effective now                   EDUCATION NEEDS:   Not appropriate for education at this time  Skin:  Skin Assessment: Reviewed RN Assessment (closed head incision)  Last BM:  12/15  Height:   Ht Readings from Last 1 Encounters:  11/25/21 5\' 10"  (1.778 m)    Weight:   Wt Readings from Last 1 Encounters:  11/25/21 98.1 kg    BMI:  Body mass index is 31.03 kg/m.  Estimated Nutritional Needs:   Kcal:  2100-2300  Protein:  115-125 grams  Fluid:  > 2 l/day  Lockie Pares., RD, LDN, CNSC See AMiON for contact information

## 2021-11-30 NOTE — Progress Notes (Signed)
Physical Therapy Treatment Patient Details Name: [name] MRN: [id number] DOB: Aug 06, 1946 Today's Date: 11/30/2021   History of Present Illness 75 yo male presenting to ED on 12/11 with ataxia and vertigo. 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 TNK given at 1214. S/p suboccipital crani for evacuation on 12/13. PMH including CAD s/p CABG x4, HLD, OA, nephrolithiasis, urinary retention, BPH with LUTS and prostate cancer.    PT Comments    Patient's cognition seems to be improved this session. Following commands with consistently but with increased time. Patient required modA+2 for bed mobility and transfers with HHAx2. Patient able to take pivotal steps towards recliner with better control from previous session although continues to be ataxic. Continue to recommend CIR level therapies to assist with maximizing functional mobility and safety.     Recommendations for follow up therapy are one component of a multi-disciplinary discharge planning process, led by the attending physician.  Recommendations may be updated based on patient status, additional functional criteria and insurance authorization.  Follow Up Recommendations  Acute inpatient rehab (3hours/day) (from [facility], would like to go to [facility] rehab)     Assistance Recommended at Discharge Frequent or constant Supervision/Assistance  Equipment Recommendations  Other (comment) (TBD)    Recommendations for Other Services       Precautions / Restrictions Precautions Precautions: Fall Restrictions Weight Bearing Restrictions: No     Mobility  Bed Mobility Overal bed mobility: Needs Assistance Bed Mobility: Supine to Sit     Supine to sit: Mod assist;+2 for physical assistance     General bed mobility comments: modA+2 for all aspects of bed mobility. Patient overshooting trunk elevation requiring assist to regain balance. Patient with difficulty sequencing and  coordinating    Transfers Overall transfer level: Needs assistance Equipment used: 2 person hand held assist Transfers: Sit to/from Stand;Bed to chair/wheelchair/BSC Sit to Stand: Mod assist;+2 physical assistance     Step pivot transfers: Mod assist;+2 physical assistance     General transfer comment: modA+2 to rise and steady from EOB. Patient able to take pivotal steps towards recliner on L side with modA+2 for balance and sequencing.    Ambulation/Gait                   Stairs             Wheelchair Mobility    Modified Rankin (Stroke Patients Only) Modified Rankin (Stroke Patients Only) Pre-Morbid Rankin Score: No symptoms Modified Rankin: Severe disability     Balance Overall balance assessment: Needs assistance Sitting-balance support: No upper extremity supported;Feet supported Sitting balance-Leahy Scale: Poor Sitting balance - Comments: mild L lateral lean. Min guard for sitting balance, at times minA   Standing balance support: Bilateral upper extremity supported;During functional activity Standing balance-Leahy Scale: Poor Standing balance comment: reliant on UE support and external assist                            Cognition Arousal/Alertness: Awake/alert Behavior During Therapy: Impulsive Overall Cognitive Status: Impaired/Different from baseline Area of Impairment: Following commands;Safety/judgement;Awareness;Problem solving;Attention                   Current Attention Level: Sustained   Following Commands: Follows one step commands with increased time Safety/Judgement: Decreased awareness of safety;Decreased awareness of deficits Awareness: Intellectual Problem Solving: Slow processing;Requires verbal cues;Difficulty sequencing General Comments: cognition seems to be improved this session. Following  commands with increased time and appropriately. Easily distracted requiring cues for redirection         Exercises      General Comments        Pertinent Vitals/Pain Pain Assessment: Faces Faces Pain Scale: No hurt Facial Expression: Relaxed, neutral Body Movements: Absence of movements Muscle Tension: Relaxed Compliance with ventilator (intubated pts.): N/A Vocalization (extubated pts.): Talking in normal tone or no sound CPOT Total: 0 Pain Intervention(s): Monitored during session    Home Living                          Prior Function            PT Goals (current goals can now be found in the care plan section) Acute Rehab PT Goals PT Goal Formulation: With patient/family Time For Goal Achievement: 12/10/21 Potential to Achieve Goals: Good Progress towards PT goals: Progressing toward goals    Frequency    Min 4X/week      PT Plan Current plan remains appropriate    Co-evaluation PT/OT/SLP Co-Evaluation/Treatment: Yes Reason for Co-Treatment: Necessary to address cognition/behavior during functional activity;For patient/therapist safety;To address functional/ADL transfers PT goals addressed during session: Mobility/safety with mobility;Balance;Strengthening/ROM        AM-PAC PT "6 Clicks" Mobility   Outcome Measure  Help needed turning from your back to your side while in a flat bed without using bedrails?: A Little Help needed moving from lying on your back to sitting on the side of a flat bed without using bedrails?: Total Help needed moving to and from a bed to a chair (including a wheelchair)?: Total Help needed standing up from a chair using your arms (e.g., wheelchair or bedside chair)?: Total Help needed to walk in hospital room?: Total Help needed climbing 3-5 steps with a railing? : Total 6 Click Score: 8    End of Session Equipment Utilized During Treatment: Gait belt Activity Tolerance: Patient tolerated treatment well Patient left: in chair;with call bell/phone within reach;with chair alarm set Nurse Communication: Mobility  status PT Visit Diagnosis: Unsteadiness on feet (R26.81);Difficulty in walking, not elsewhere classified (R26.2);Ataxic gait (R26.0)     Time: 8003-4917 PT Time Calculation (min) (ACUTE ONLY): 23 min  Charges:  $Therapeutic Activity: 8-22 mins                     Timotheus Salm A. Gilford Rile PT, DPT Acute Rehabilitation Services Pager [phone] Office [phone]    [name] 11/30/2021, 3:43 PM

## 2021-12-01 ENCOUNTER — Inpatient Hospital Stay (HOSPITAL_COMMUNITY): Payer: Medicare Other

## 2021-12-01 DIAGNOSIS — I63013 Cerebral infarction due to thrombosis of bilateral vertebral arteries: Secondary | ICD-10-CM | POA: Diagnosis not present

## 2021-12-01 DIAGNOSIS — E870 Hyperosmolality and hypernatremia: Secondary | ICD-10-CM

## 2021-12-01 DIAGNOSIS — F05 Delirium due to known physiological condition: Secondary | ICD-10-CM

## 2021-12-01 LAB — CULTURE, RESPIRATORY W GRAM STAIN: Culture: NORMAL

## 2021-12-01 LAB — BASIC METABOLIC PANEL
Anion gap: 7 (ref 5–15)
BUN: 18 mg/dL (ref 8–23)
CO2: 19 mmol/L — ABNORMAL LOW (ref 22–32)
Calcium: 7.2 mg/dL — ABNORMAL LOW (ref 8.9–10.3)
Chloride: 126 mmol/L — ABNORMAL HIGH (ref 98–111)
Creatinine, Ser: 1.09 mg/dL (ref 0.61–1.24)
GFR, Estimated: >60 mL/min (ref >60)
Glucose, Bld: 112 mg/dL — ABNORMAL HIGH (ref 70–99)
Potassium: 4.5 mmol/L (ref 3.5–5.1)
Sodium: 152 mmol/L — ABNORMAL HIGH (ref 135–145)

## 2021-12-01 LAB — GLUCOSE, CAPILLARY
Glucose-Capillary: 100 mg/dL — ABNORMAL HIGH (ref 70–99)
Glucose-Capillary: 149 mg/dL — ABNORMAL HIGH (ref 70–99)
Glucose-Capillary: 97 mg/dL (ref 70–99)
Glucose-Capillary: 126 mg/dL — ABNORMAL HIGH (ref 70–99)
Glucose-Capillary: 129 mg/dL — ABNORMAL HIGH (ref 70–99)

## 2021-12-01 LAB — MAGNESIUM
Magnesium: 2.5 mg/dL — ABNORMAL HIGH (ref 1.7–2.4)
Magnesium: 2.5 mg/dL — ABNORMAL HIGH (ref 1.7–2.4)

## 2021-12-01 LAB — PHOSPHORUS
Phosphorus: 3 mg/dL (ref 2.5–4.6)
Phosphorus: 3.2 mg/dL (ref 2.5–4.6)

## 2021-12-01 LAB — SODIUM: Sodium: 153 mmol/L — ABNORMAL HIGH (ref 135–145)

## 2021-12-01 IMAGING — CT CT HEAD W/O CM
4 series · 17 of 47 positions shown, 19 images · non-contrast
Comparison: Three days ago

CLINICAL DATA: Follow-up stroke

EXAM:
CT HEAD WITHOUT CONTRAST
TECHNIQUE: Contiguous axial images were obtained from the base of the skull
through the vertex without intravenous contrast.

[Series 3: head bone · axial · 0.51mm/px · z∈[+996,+1054]mm · 4 of 85 slices shown]
[im 9/85  bone]
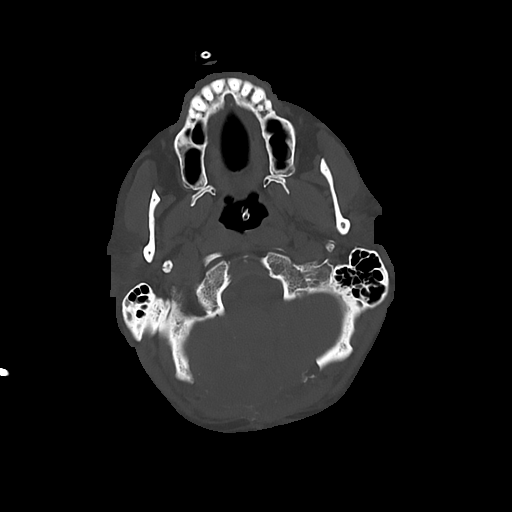
[im 17/85  bone]
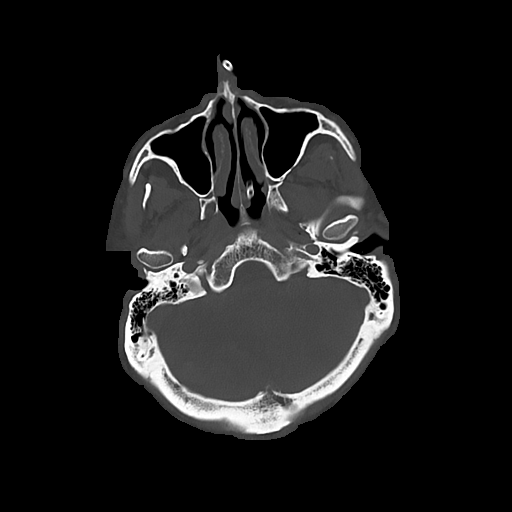
[im 26/85  bone]
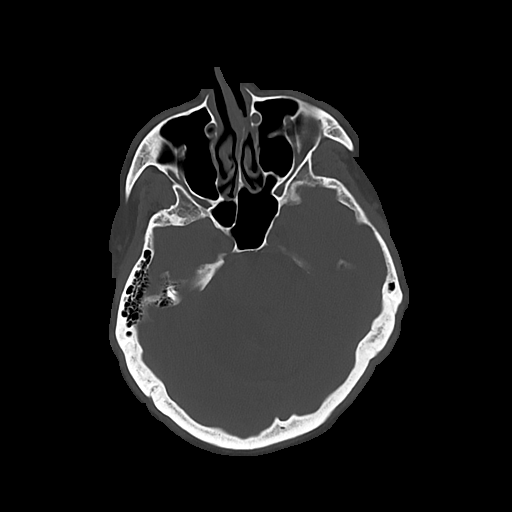
[im 38/85  bone]
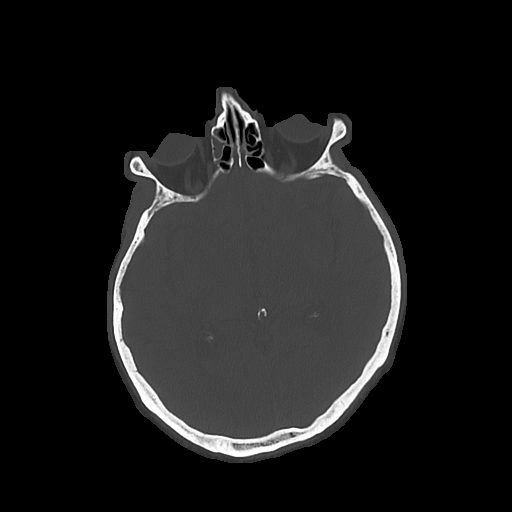

[Series 4: head wo · axial · 0.51mm/px · z∈[+1000,+1120]mm · 7 of 34 slices shown, 9 images]
[im 5/34  brain]
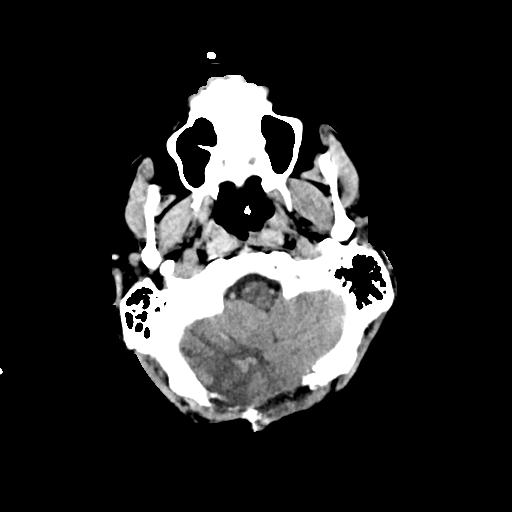
[im 5/34  bone]
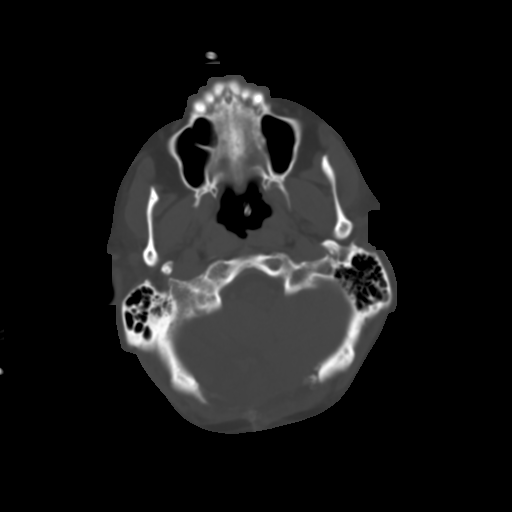
[im 9/34  brain]
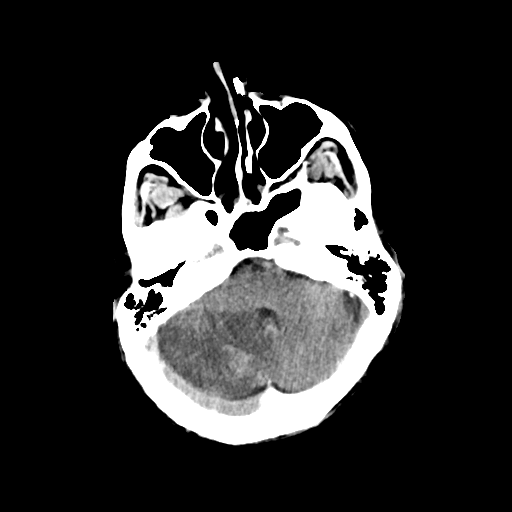
[im 13/34  brain]
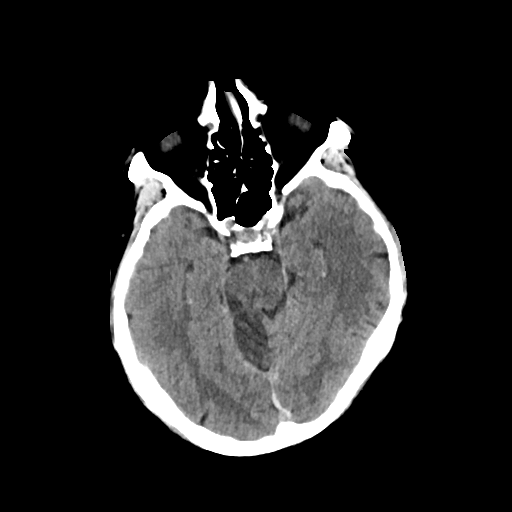
[im 17/34  brain]
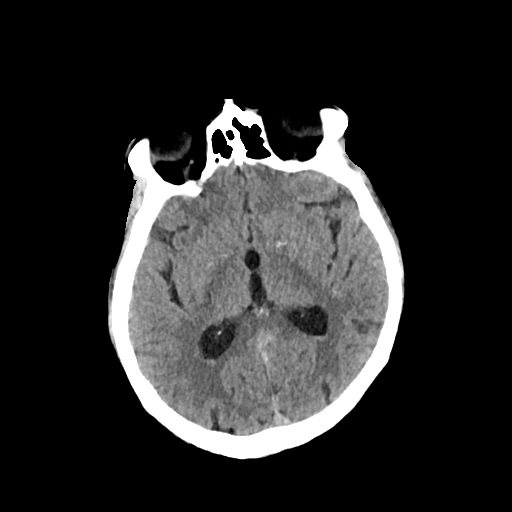
[im 21/34  brain]
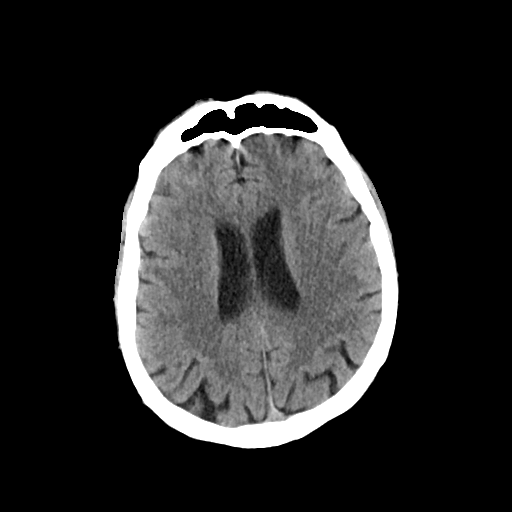
[im 21/34  bone]
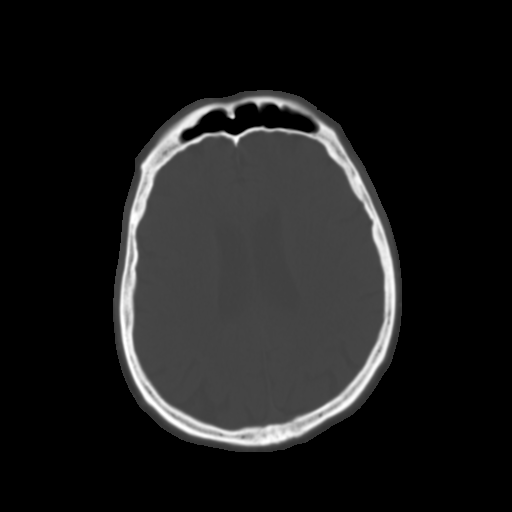
[im 25/34  brain]
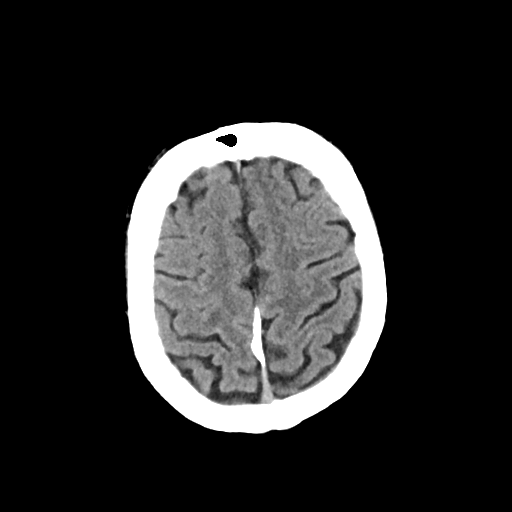
[im 29/34  brain]
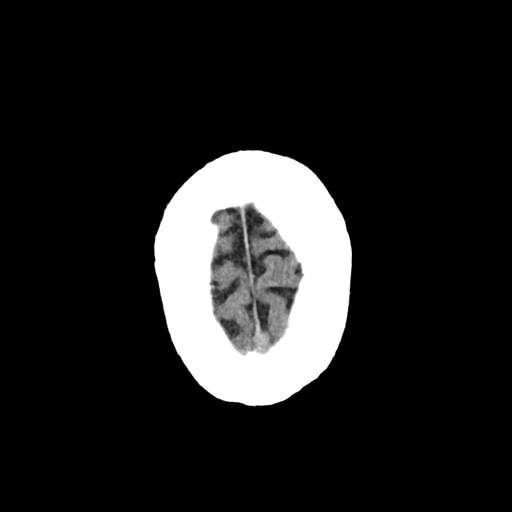

[Series 5: cor soft · coronal · 0.37mm/px · 3 of 72 slices shown]
[im 24/72  brain]
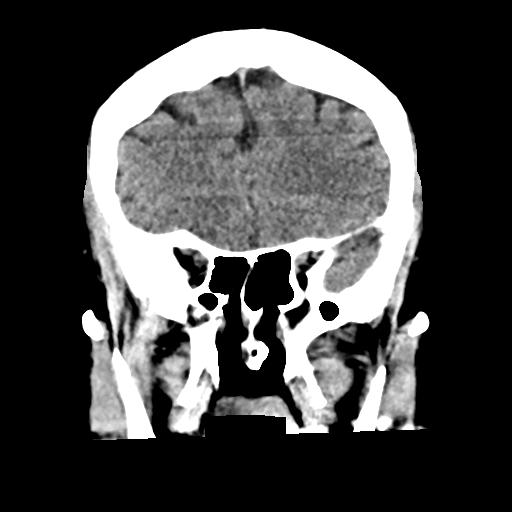
[im 32/72  brain]
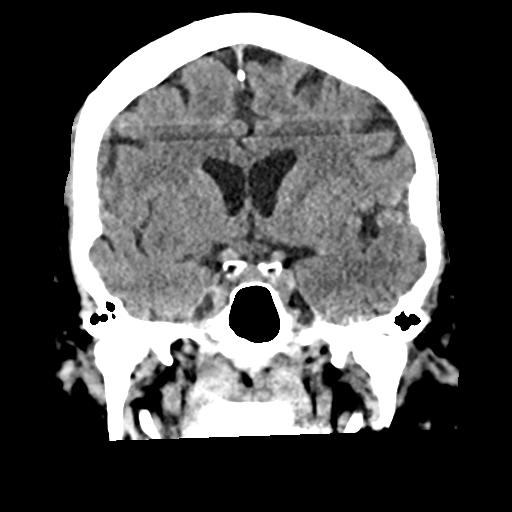
[im 40/72  brain]
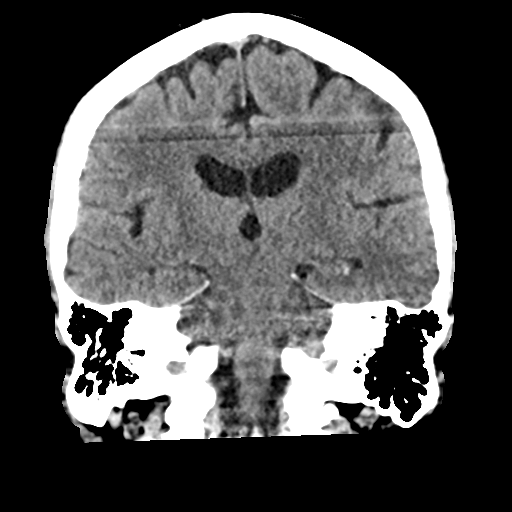

[Series 6: sag soft · sagittal · 0.40mm/px · 3 of 62 slices shown]
[im 21/62  brain]
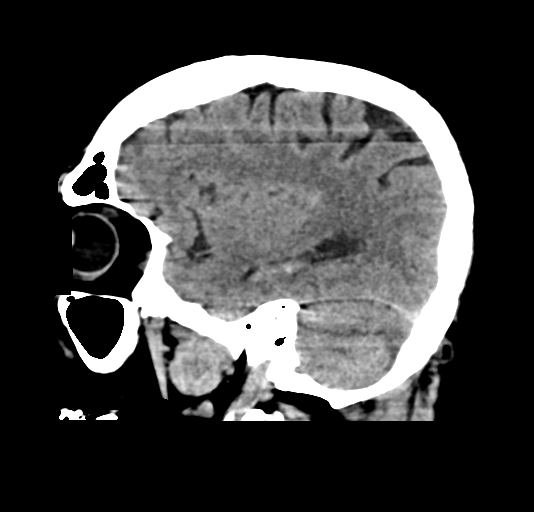
[im 31/62  brain]
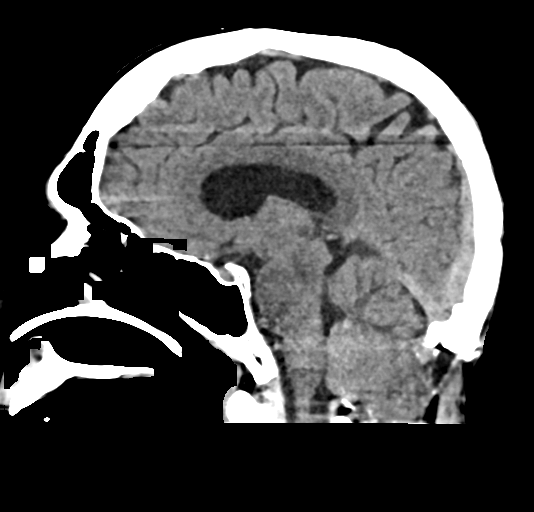
[im 41/62  brain]
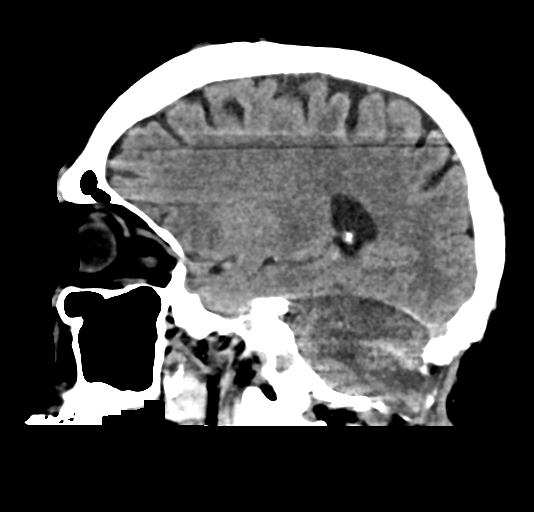

[17 of 47 positions shown; findings below may reference images not displayed]

FINDINGS: Brain: Known cerebellar infarction, right more than left, with
hemorrhage and fourth ventricular narrowing post decompressive
suboccipital craniectomy. Small volume hemorrhage layering in the
occipital horns of the lateral ventricles. No ventricular dilatation
or change. No new infarct.

Vascular: Unremarkable

Skull: Suboccipital craniectomy with unchanged bulging of the
swollen brain.

Sinuses/Orbits: Negative
IMPRESSION: Stable cerebellar infarction with hemorrhage. No hydrocephalus or
other interval change.

## 2021-12-01 MED ORDER — LOSARTAN POTASSIUM 50 MG PO TABS
50.0000 mg | ORAL_TABLET | Freq: Two times a day (BID) | ORAL | Status: DC
  Administered 2021-12-01 – 2021-12-04 (×8): 50 mg
  Filled 2021-12-01 (×8): qty 1

## 2021-12-01 MED ORDER — CLONIDINE HCL 0.2 MG/24HR TD PTWK
0.2000 mg | MEDICATED_PATCH | TRANSDERMAL | Status: DC
  Administered 2021-12-01: 0.2 mg via TRANSDERMAL
  Filled 2021-12-01: qty 1

## 2021-12-01 MED ORDER — SODIUM CHLORIDE 3 % IV SOLN: INTRAVENOUS | Status: DC

## 2021-12-01 MED ORDER — ENOXAPARIN SODIUM 30 MG/0.3ML IJ SOSY
30.0000 mg | PREFILLED_SYRINGE | Freq: Every day | INTRAMUSCULAR | Status: DC
Start: 2021-12-01 — End: 2021-12-03
  Administered 2021-12-01 – 2021-12-02 (×2): 30 mg via SUBCUTANEOUS
  Filled 2021-12-01 (×2): qty 0.3

## 2021-12-01 NOTE — Progress Notes (Addendum)
STROKE TEAM PROGRESS NOTE   INTERVAL HISTORY Patient is seen in his room with his wife [name] and his sister at the bedside.  Patient was still restless overnight, but he said he did not sleep well.  He is on Seroquel 50 mg at bedtime and Precedex.  He did not pass his modified barium swallow screen yesterday and does have a cortrak in place.  Orders and to titrate down 3% saline overnight.  And also plan to wean Cleviprex drip and discontinue. CT head this morning shows stable appearance of cerebral infarction with petechial hemorrhage.  No hydrocephalus or interval change  Vitals:   12/01/21 1345 12/01/21 1400 12/01/21 1415 12/01/21 1430  BP: (!) 114/57 (!) 119/55 (!) 130/58 128/63  Pulse: 81 77 76 75  Resp: (!) 36 (!) 28 (!) 40   Temp:      TempSrc:      SpO2: 94% 94% 94%   Weight:      Height:       CBC:  Recent Labs  Lab 11/25/21 1020 11/25/21 1029 11/29/21 0617 11/30/21 0409  WBC 6.8   < > 10.0 9.9  NEUTROABS 3.3  --   --   --   HGB 16.6   < > 12.7* 15.6  HCT 48.7   < > 38.9* 45.4  MCV 90.7   < > 92.2 91.3  PLT 223   < > 187 211   < > = values in this interval not displayed.    Basic Metabolic Panel:  Recent Labs  Lab 11/30/21 0409 11/30/21 1005 11/30/21 1659 11/30/21 2159 12/01/21 0329 12/01/21 0348 12/01/21 0516  NA 152*   < > 147*   < > 152*  --  153*  K 4.3  --   --   --  4.5  --   --   CL 123*  --   --   --  126*  --   --   CO2 19*  --   --   --  19*  --   --   GLUCOSE 103*  --   --   --  112*  --   --   BUN 19  --   --   --  18  --   --   CREATININE 0.96  --   --   --  1.09  --   --   CALCIUM 7.9*  --   --   --  7.2*  --   --   MG  --    < > 2.8*  --   --  2.5*  --   PHOS  --    < > 2.5  --   --  3.0  --    < > = values in this interval not displayed.    Lipid Panel:  Recent Labs  Lab 11/26/21 0512  CHOL 164  TRIG 52  HDL 48  CHOLHDL 3.4  VLDL 10  LDLCALC 106*    HgbA1c:  Recent Labs  Lab 11/26/21 0512  HGBA1C 5.8*    Urine Drug  Screen:  Recent Labs  Lab 11/25/21 1646  LABOPIA NONE DETECTED  COCAINSCRNUR NONE DETECTED  LABBENZ NONE DETECTED  AMPHETMU NONE DETECTED  THCU NONE DETECTED  LABBARB NONE DETECTED     Alcohol Level No results for input(s): ETH in the last 168 hours.  IMAGING past 24 hours CT HEAD WO CONTRAST (5MM)  Result Date: 12/01/2021 CLINICAL DATA:  Follow-up stroke EXAM: CT  HEAD WITHOUT CONTRAST TECHNIQUE: Contiguous axial images were obtained from the base of the skull through the vertex without intravenous contrast. COMPARISON:  Three days ago FINDINGS: Brain: Known cerebellar infarction, right more than left, with hemorrhage and fourth ventricular narrowing post decompressive suboccipital craniectomy. Small volume hemorrhage layering in the occipital horns of the lateral ventricles. No ventricular dilatation or change. No new infarct. Vascular: Unremarkable Skull: Suboccipital craniectomy with unchanged bulging of the swollen brain. Sinuses/Orbits: Negative IMPRESSION: Stable cerebellar infarction with hemorrhage. No hydrocephalus or other interval change. Electronically Signed   By: Jorje Guild M.D.   On: 12/01/2021 05:03    PHYSICAL EXAM  Temp:  [98 F (36.7 C)-102.1 F (38.9 C)] 99.2 F (37.3 C) (12/17 1215) Pulse Rate:  [64-90] 75 (12/17 1430) Resp:  [19-44] 40 (12/17 1415) BP: (104-183)/(51-108) 128/63 (12/17 1430) SpO2:  [89 %-97 %] 94 % (12/17 1415)  General - Well nourished, well developed, in no apparent distress. Cardiovascular - Regular rhythm and rate. Respiratory- respirations even and unlabored  NEURO:  Mental Status: AA&Ox3 Speech/Language:  Able to speak short phrases, mild nonfluent speech does have some difficulty finding the correct word and some confusion. Patient is responding more to left side, however is also responding to people on his right now.   Cranial Nerves II - XII - II -no visual field deficit III, IV, VI -full range but had saccadic dysmetria on  lateral gaze to the right V - Facial sensation intact bilaterally. VII - Facial movement intact bilaterally. VIII - Hearing & vestibular intact bilaterally. X - Palate elevates symmetrically. XI - Chin turning & shoulder shrug intact bilaterally. XII - Tongue protrusion intact.   Motor Strength -right upper extremity and right lower extremity slightly weaker than left. Motor Tone - Muscle tone was assessed at the neck and appendages and was normal. Reflexes - The patients reflexes were symmetrical in all extremities and he had no pathological reflexes. Sensory - Light touch, temperature/pinprick were assessed and were symmetrical.   Coordination -difficulty with finger-to-nose and medial coordination on the right gait and Station - deferred.   ASSESSMENT/PLAN [name] is a 75 y.o. male with history of CABG x4, hyperlipidemia, prostate cancer, internal hemorrhoid, and a nonobstructive calculus in right interpole with hematuria presenting with acute onset vertigo nausea vomiting and an inability to walk.  At 10 AM on November 25, 2021 patient developed vertigo nausea and vomiting.  He also noted abdominal discomfort.  A code stroke was called on his arrival to triage.  Once the patient was in the CT scanner he developed more nausea and vomiting he went to a stat MRI which was positive for bilateral cerebellar strokes.  He then developed ataxia in his bilateral upper and lower extremities.  He was then given TNKase and went for a stat CTA of the head.  No LVO was shown on the CTA. Repeat MRI showed progression of edema related to the cerebellar infarcts with mass effect on the 4th ventricle. Neurosurgery consulted and hypertonic saline initiated. 12/13 suboccipital craniectomy overnight.  Patient tolerated procedure well.  Speech therapy recommends resuming nectar thickened liquids with his diet.  Repeat CT 12/14 shows hemorrhagic transformation of patient's stroke.  Lovenox resumed today as he  is post 24 hours from hemorrhagic transformation of his stroke.  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 ASPECTS 10.    Repeat CT-extensive swelling  in the areas of the acute cerebellar infarction more extensive on the right than the left.  Mass-effect on the fourth ventricle.  No evidence of ventricular obstruction.  Stable lateral and third ventricles. Post craniectomy CT-Unchanged right greater than left cerebellar infarcts.  No new infarction.  Unchanged narrowing of the fourth ventricle.  No evidence of ventricular obstruction or hydrocephalus. Repeat CT 12/14 New hyperdensity in right cerebellar infarct, likely hemorrhage with no increased mass effect. CTA head & neck unremarkable MRI  acute infarct of the cerebellum bilaterally.  negative for hemorrhage.  MRI Repeat 12/12- Interval progression of edema associated to bilateral cerebellar acute infarcts with mass effect on the fourth ventricle and low lying right cerebellar tonsil. No hydrocephalus CT head 12/14 -right cerebellum hemorrhagic transformation without hydrocephalus CT repeat pending 2D Echo EF 70-75% LDL 106 HgbA1c 5.8 VTE prophylaxis - SCDs aspirin 81 mg daily prior to admission, now on aspirin 81, lovenox and plavix discontinued due to hemorrhagic transformation of cerebellar stroke. Will consider DOAC in 14 days post stroke if neuro and CT stable Therapy recommendations: Inpatient rehab- [facility] rehab closer to patients home Disposition: Pending  Cerebellar Edema s/p suboccipital decompression  3% normal saline initiated on 12/12 Sodium goal is 150-155 Na 154 S/p suboccipital decompression with Dr. Zada Finders 3% d/c'd and then resumed due to sodium levels Close neuro monitoring CT 12/13 reveals unchanged infarcts without evidence of hydrocephalus CT head 12/14 - right cerebellum  hemorrhagic transformation without hydrocephalus CT head 12/17- Stable cerebellar infarct with hemorrhage.  No hydrocephalus or interval change Titrate down 3% normal saline overnight.  Orders placed  Hypertension Home meds: None Stable on the high end BP <160  Continue cozaar 50 bid, cardizem 60 Q8 Add hydralazine 25 Q8 Taper off Cleviprex as able  Atrial flutter - new diagnosis Cardiology on board IV Cardizem changed to p.o. 60 mg Q8 As needed metoprolol for rate control Consider DOAC in 14 days post stroke if neuro and CT stable  Fever Leukocytosis T-max 101.5->101.3-> 99.3->101.9 WBC 13.3->13.6->10.0->9.9 CXR 12/13 - Left lower lobe consolidation with small adjacent left pleural effusion. Findings are likely either due to pneumonia or aspiration. On unasyn   Hyperlipidemia Home meds: Atorvastatin 80 mg, resumed in hospital LDL 106, goal < 70 Atorvastatin 80 mg daily Continue statin at discharge  Delirium/agitation Encephalopathy Sundowning On precedex, taper off as able On seroquel 25 Qhs-> increase to 50 tonight Could be due to increased ICP vs. Fever/aspiration pneumonia Repeat CT 12/14 reveals no hydrocephalus Emphasize sleep hygiene  Dysphagia NPO for now MBSS done 12/16 [place]ak ordered. Patient is only able to have sparing thickened liquids currently Will start TF and meds via cortrak  Other Stroke Risk Factors Advanced Age >/= 65  Obesity, Body mass index is 31.03 kg/m., BMI >/= 30 associated with increased stroke risk, recommend weight loss, diet and exercise as appropriate  Coronary artery disease s/p CABG  Other Active Problems Abdominal discomfort CTA abdomen pelvis-no evidence of active GI bleed.  Moderate aortic atherosclerosis.  Diverticulosis with no evidence of diverticulitis. Internal hemorrhoid Nonobstructive calculus in right interpole Hematuria one month ago Flomax DC'd and changed to Urecholine 10 mg 3 times daily   Hospital day #  6  Patient seen and examined by NP/APP with MD. MD to update note as needed.   Janine Ores, DNP, FNP-BC [facility] Pager: [phone]  STROKE MD NOTE : I have personally obtained history,examined this patient, reviewed notes, independently viewed imaging studies, participated in medical decision making  and plan of care.ROS completed by me personally and pertinent positives fully documented  I have made any additions or clarifications directly to the above note. Agree with note above.  Plan wean off hypertonic saline drip over the next 24 hours and discontinue.  We will Cleviprex drip and use as needed blood pressure medicines and oral medications with systolic goal below 176.  Mobilize out of bed.  Therapy consults.  Hopefully transfer out of ICU in the next couple of days and then to rehab.  Long discussion with patient's wife and sister at the bedside.  They prefer inpatient rehab [facility] area where the left.This patient is critically ill and at significant risk of neurological worsening, death and care requires constant monitoring of vital signs, hemodynamics,respiratory and cardiac monitoring, extensive review of multiple databases, frequent neurological assessment, discussion with family, other specialists and medical decision making of high complexity.I have made any additions or clarifications directly to the above note.This critical care time does not reflect procedure time, or teaching time or supervisory time of PA/NP/Med Resident etc but could involve care discussion time.  I spent 30 minutes of neurocritical care time  in the care of  this patient.      Antony Contras, MD Medical Director [facility] Stroke [facility] Pager: [phone] 12/01/2021 3:26 PM    To contact Stroke Continuity provider, please refer to [email or web address] After hours, contact General Neurology

## 2021-12-01 NOTE — Progress Notes (Signed)
NAME:  [name], MRN:  [id number], DOB:  April 13, 1946, LOS: 6 ADMISSION DATE:  11/25/2021, CONSULTATION DATE:  12/13 REFERRING MD:  Marcelle Overlie, CHIEF COMPLAINT:  Stroke symptoms   History of Present Illness:  74 y/o male admitted with a cerebellar stroke on 12/11, received TNKase, he developed increasing mass effect on the 4th ventricle, moved to the ICU.  Pertinent  Medical History  CAD, s/p CABG  HLD  OA,  Nephrolithiasis BPH  Prostate cancer   Interim History / Subjective:  Patient could not sleep last night due to agitation and delirium Still requiring Precedex infusion Repeat head CT was done this morning, showed no new change   Objective   Blood pressure (!) 142/74, pulse 83, temperature (!) 100.7 F (38.2 C), temperature source Oral, resp. rate (!) 36, height 5\' 10"  (1.778 m), weight 98.1 kg, SpO2 94 %.        Intake/Output Summary (Last 24 hours) at 12/01/2021 1035 Last data filed at 12/01/2021 0800 Gross per 24 hour  Intake 2601.61 ml  Output 2900 ml  Net -298.39 ml   Filed Weights   11/25/21 1200  Weight: 98.1 kg    Examination: Physical exam: General: Acutely ill-appearing male, lying on the bed HEENT: [place]/AT, eyes anicteric.  Severely dry mucus membranes Neuro: Awake but confused, dysarthric and dysmetric.  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 and compression of ventral pons s/p posterior decompression with craniectomy Hemorrhagic conversion of R cerebellar infarct Continue aggressive PT OT Will need rehab post hospital, looking at [facility] inpatient rehab Continue aspirin, holding Plavix Stroke team is following  Dysphagia Micro aspirations Modified barium swallow  showing visible aspiration Cortrak was placed yesterday Continue tube feeds   Right lower lobe pneumonia Patient became afebrile after the start of Unasyn Unasyn White count is trending down  Chronic HFpEF Hypertension, uncontrolled CAD Cleviprex Goal SBP < 140 Losartan Lipitor Started on clonidine, try to taper off Cleviprex infusion  Induced hypernatremia Hypokalemia, corrected Hypocalcemia Monitor BMET and UOP Replace electrolytes as needed Per stroke team will taper off 3% saline  Hyperactive delirium Continue Precedex Seroquel  Proximal atrial flutter Continue diltiazem  BPH flomax   Best Practice (right click and "Reselect all SmartList Selections" daily)   Diet/type: NPO tube feeds DVT prophylaxis: SCD GI prophylaxis: PPI Lines: N/A Foley:  N/A Code Status:  full code Last date of multidisciplinary goals of care discussion [12/16: OK with CPR and short term intubation but would not want prolonged life support.  Would reconsider need for mechanical ventilation if no improvement after 5-7 days.]  Labs   CBC: Recent Labs  Lab 11/25/21 1020 11/25/21 1029 11/26/21 0512 11/27/21 0309 11/27/21 0311 11/28/21 0331 11/29/21 0617 11/30/21 0409  WBC 6.8  --  13.3*  --  12.1* 13.6* 10.0 9.9  NEUTROABS 3.3  --   --   --   --   --   --   --   HGB 16.6   < > 15.6 14.3 14.3 14.6 12.7* 15.6  HCT 48.7   < > 45.5 42.0 43.0 43.0 38.9* 45.4  MCV 90.7  --  89.6  --  92.1 91.5 92.2 91.3  PLT 223  --  232  --  217  195 187 211   < > = values in this interval not displayed.    Basic Metabolic Panel: Recent Labs  Lab 11/27/21 0311 11/27/21 1059 11/28/21 0331 11/28/21 1002 11/29/21 0617 11/29/21 1106 11/30/21 0409 11/30/21 1005 11/30/21 1320 11/30/21 1659 11/30/21 2159 12/01/21 0329 12/01/21 0348 12/01/21 0516  NA 142   < > 152*   < > 158*   < > 152* 154*  --  147* 147* 152*  --  153*  K 3.7  --  3.2*  --  3.4*  --  4.3  --   --   --   --  4.5  --   --    CL 112*  --  121*  --  127*  --  123*  --   --   --   --  126*  --   --   CO2 22  --  24  --  22  --  19*  --   --   --   --  19*  --   --   GLUCOSE 131*  --  124*  --  109*  --  103*  --   --   --   --  112*  --   --   BUN 17  --  15  --  20  --  19  --   --   --   --  18  --   --   CREATININE 0.89  --  1.12  --  1.08  --  0.96  --   --   --   --  1.09  --   --   CALCIUM 7.8*  --  7.9*  --  8.1*  --  7.9*  --   --   --   --  7.2*  --   --   MG  --   --   --   --   --   --   --   --  2.6* 2.8*  --   --  2.5*  --   PHOS  --   --   --   --   --   --   --   --  2.3* 2.5  --   --  3.0  --    < > = values in this interval not displayed.   GFR: Estimated Creatinine Clearance: 69.8 mL/min (by C-G formula based on SCr of 1.09 mg/dL). Recent Labs  Lab 11/27/21 0311 11/28/21 0331 11/29/21 0617 11/30/21 0409  WBC 12.1* 13.6* 10.0 9.9    Liver Function Tests: Recent Labs  Lab 11/25/21 1020  AST 31  ALT 29  ALKPHOS 86  BILITOT 1.2  PROT 6.9  ALBUMIN 3.9   No results for input(s): LIPASE, AMYLASE in the last 168 hours. No results for input(s): AMMONIA in the last 168 hours.  ABG    Component Value Date/Time   PHART 7.391 11/27/2021 0309   PCO2ART 38.7 11/27/2021 0309   PO2ART 212 (H) 11/27/2021 0309   HCO3 23.6 11/27/2021 0309   TCO2 25 11/27/2021 0309   ACIDBASEDEF 1.0 11/27/2021 0309   O2SAT 100.0 11/27/2021 0309     Coagulation Profile: Recent Labs  Lab 11/25/21 1020  INR 1.0    Cardiac Enzymes: No results for input(s): CKTOTAL, CKMB, CKMBINDEX, TROPONINI in the last 168 hours.  HbA1C: Hgb A1c MFr Bld  Date/Time Value Ref Range Status  11/26/2021 05:12 AM 5.8 (H) 4.8 - 5.6 %  Final    Comment:    (NOTE) Pre diabetes:          5.7%-6.4%  Diabetes:              >6.4%  Glycemic control for   <7.0% adults with diabetes     CBG: Recent Labs  Lab 11/30/21 1551 11/30/21 1920 11/30/21 2339 12/01/21 0359 12/01/21 0745  GLUCAP 115* 115* 107* 100* 149*     Critical care time:     Total critical care time: 33 minutes  Performed by: [place] care time was exclusive of separately billable procedures and treating other patients.   Critical care was necessary to treat or prevent imminent or life-threatening deterioration.   Critical care was time spent personally by me on the following activities: development of treatment plan with patient and/or surrogate as well as nursing, discussions with consultants, evaluation of patient's response to treatment, examination of patient, obtaining history from patient or surrogate, ordering and performing treatments and interventions, ordering and review of laboratory studies, ordering and review of radiographic studies, pulse oximetry and re-evaluation of patient's condition.   Jacky Kindle MD [facility] See Amion for pager If no response to pager, please call [phone] until 7pm After 7pm, Please call E-link [phone]

## 2021-12-01 NOTE — Progress Notes (Signed)
Physical Therapy Treatment Patient Details Name: [name] MRN: [id number] DOB: 04-Mar-1946 Today's Date: 12/01/2021   History of Present Illness 75 yo male presenting to ED on 12/11 with ataxia and vertigo. 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 TNK given at 1214. S/p suboccipital crani for evacuation on 12/13. PMH including CAD s/p CABG x4, HLD, OA, nephrolithiasis, urinary retention, BPH with LUTS and prostate cancer.    PT Comments    Patient able to progress to EOB with modA this date. Patient continues to require minA to maintain sitting balance due to truncal ataxia. Patient able to stand for pericare with modA+2 and 3rd person to perform pericare. Patient transferred to recliner with modA+2 and able to take steps towards recliner. Will need +2 and chair follow to progress beyond transfer. Patient more fatigued this date than previous requiring more redirection cues and cues for slow controlled movement. Continue to recommend CIR level therapies to assist with maximizing functional mobility and safety.     Recommendations for follow up therapy are one component of a multi-disciplinary discharge planning process, led by the attending physician.  Recommendations may be updated based on patient status, additional functional criteria and insurance authorization.  Follow Up Recommendations  Acute inpatient rehab (3hours/day) (from [facility], would like to go to [facility] rehab)     Assistance Recommended at Discharge Frequent or constant Supervision/Assistance  Equipment Recommendations  Other (comment) (TBD)    Recommendations for Other Services       Precautions / Restrictions Precautions Precautions: Fall Precaution Comments: cortrak, SBP <160 Restrictions Weight Bearing Restrictions: No     Mobility  Bed Mobility Overal bed mobility: Needs Assistance Bed Mobility: Supine to Sit     Supine to sit: Mod assist     General  bed mobility comments: modA to bring LEs off bed and elevate trunk. Cues required for slow controlled movement    Transfers Overall transfer level: Needs assistance Equipment used: 2 person hand held assist Transfers: Sit to/from Stand;Bed to chair/wheelchair/BSC Sit to Stand: Mod assist;+2 physical assistance     Step pivot transfers: Mod assist;+2 physical assistance     General transfer comment: modA+2 to stand from EOB x 2 for peri care and then preparation to perform step pivot transfer. Patient able to take steps towards recliner with better control but continues to be impulsive. Will need 2 skilled hands and chair follow to progress beyond step pivot transfer.    Ambulation/Gait                   Stairs             Wheelchair Mobility    Modified Rankin (Stroke Patients Only) Modified Rankin (Stroke Patients Only) Pre-Morbid Rankin Score: No symptoms Modified Rankin: Severe disability     Balance Overall balance assessment: Needs assistance Sitting-balance support: No upper extremity supported;Feet supported Sitting balance-Leahy Scale: Poor Sitting balance - Comments: L lateral lean. MinA throughout for sitting balance   Standing balance support: Bilateral upper extremity supported;During functional activity Standing balance-Leahy Scale: Poor Standing balance comment: reliant on UE support and external assist                            Cognition Arousal/Alertness: Awake/alert Behavior During Therapy: Impulsive Overall Cognitive Status: Impaired/Different from baseline Area of Impairment: Following commands;Safety/judgement;Awareness;Problem solving;Attention  Current Attention Level: Sustained   Following Commands: Follows one step commands with increased time Safety/Judgement: Decreased awareness of safety;Decreased awareness of deficits Awareness: Intellectual Problem Solving: Slow processing;Requires  verbal cues;Difficulty sequencing          Exercises      General Comments        Pertinent Vitals/Pain Pain Assessment: Faces Faces Pain Scale: No hurt Pain Intervention(s): Monitored during session    Home Living                          Prior Function            PT Goals (current goals can now be found in the care plan section) Acute Rehab PT Goals PT Goal Formulation: With patient/family Time For Goal Achievement: 12/10/21 Potential to Achieve Goals: Good Progress towards PT goals: Progressing toward goals    Frequency    Min 4X/week      PT Plan Current plan remains appropriate    Co-evaluation              AM-PAC PT "6 Clicks" Mobility   Outcome Measure  Help needed turning from your back to your side while in a flat bed without using bedrails?: A Little Help needed moving from lying on your back to sitting on the side of a flat bed without using bedrails?: A Lot Help needed moving to and from a bed to a chair (including a wheelchair)?: Total Help needed standing up from a chair using your arms (e.g., wheelchair or bedside chair)?: Total Help needed to walk in hospital room?: Total Help needed climbing 3-5 steps with a railing? : Total 6 Click Score: 9    End of Session Equipment Utilized During Treatment: Gait belt Activity Tolerance: Patient tolerated treatment well Patient left: in chair;with call bell/phone within reach;with chair alarm set Nurse Communication: Mobility status PT Visit Diagnosis: Unsteadiness on feet (R26.81);Difficulty in walking, not elsewhere classified (R26.2);Ataxic gait (R26.0)     Time: 4128-7867 PT Time Calculation (min) (ACUTE ONLY): 24 min  Charges:  $Therapeutic Activity: 23-37 mins                     Randee Upchurch A. Gilford Rile PT, DPT Acute Rehabilitation Services Pager [phone] Office [phone]    [name] 12/01/2021, 12:28 PM

## 2021-12-01 NOTE — Progress Notes (Signed)
NEUROSURGERY PROGRESS NOTE  Postop day 5 suboccipital decompression for stroke. Sitting up in bed and alert. Having some agitation issues which is being managed by CCM and neurology. Plan for CIR per therapy. No acute events overnight and no change in neurological function.   Temp:  [98 F (36.7 C)-100.7 F (38.2 C)] 100.7 F (38.2 C) (12/17 0800) Pulse Rate:  [58-90] 83 (12/17 1000) Resp:  [15-42] 36 (12/17 1000) BP: (104-183)/(56-108) 142/74 (12/17 1000) SpO2:  [89 %-97 %] 94 % (12/17 1000)   Eleonore Chiquito, NP 12/01/2021 10:39 AM

## 2021-12-02 ENCOUNTER — Inpatient Hospital Stay (HOSPITAL_COMMUNITY): Payer: Medicare Other

## 2021-12-02 DIAGNOSIS — I6300 Cerebral infarction due to thrombosis of unspecified precerebral artery: Secondary | ICD-10-CM

## 2021-12-02 DIAGNOSIS — R1311 Dysphagia, oral phase: Secondary | ICD-10-CM

## 2021-12-02 DIAGNOSIS — I63013 Cerebral infarction due to thrombosis of bilateral vertebral arteries: Secondary | ICD-10-CM

## 2021-12-02 LAB — GLUCOSE, CAPILLARY
Glucose-Capillary: 106 mg/dL — ABNORMAL HIGH (ref 70–99)
Glucose-Capillary: 116 mg/dL — ABNORMAL HIGH (ref 70–99)
Glucose-Capillary: 116 mg/dL — ABNORMAL HIGH (ref 70–99)
Glucose-Capillary: 118 mg/dL — ABNORMAL HIGH (ref 70–99)
Glucose-Capillary: 126 mg/dL — ABNORMAL HIGH (ref 70–99)
Glucose-Capillary: 126 mg/dL — ABNORMAL HIGH (ref 70–99)
Glucose-Capillary: 147 mg/dL — ABNORMAL HIGH (ref 70–99)

## 2021-12-02 LAB — SODIUM: Sodium: 160 mmol/L — ABNORMAL HIGH (ref 135–145)

## 2021-12-02 IMAGING — CT CT HEAD W/O CM
5 of 6 series · 17 of 47 positions shown, 18 images · non-contrast
Comparison: CT head dated 1 day prior

CLINICAL DATA: Right cerebellar infarct with hemorrhage, follow-up

EXAM:
CT HEAD WITHOUT CONTRAST
TECHNIQUE: Contiguous axial images were obtained from the base of the skull
through the vertex without intravenous contrast.

[Series 3: head bone · axial · 0.46mm/px · z∈[-96,+22]mm · 7 of 85 slices shown]
[im 9/85  bone]
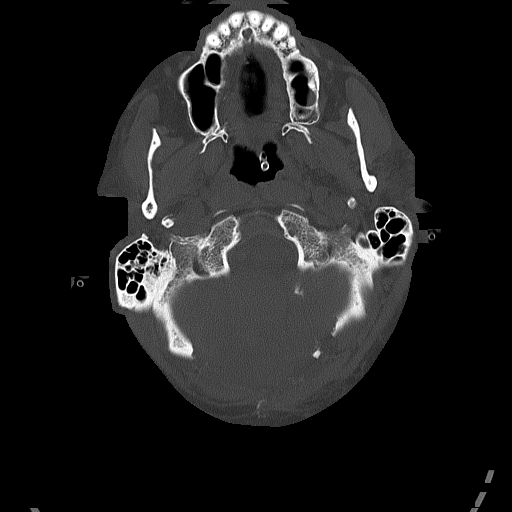
[im 17/85  bone]
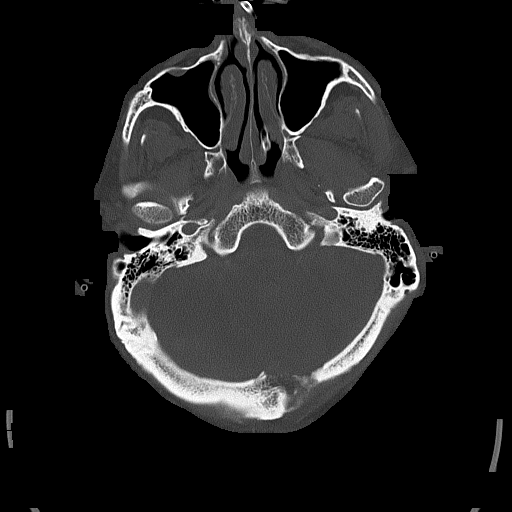
[im 26/85  bone]
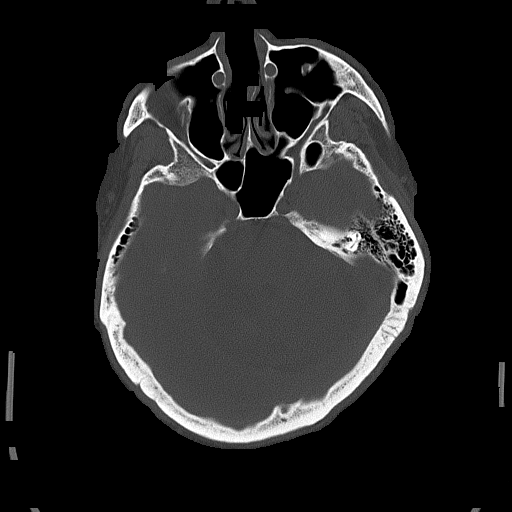
[im 34/85  bone]
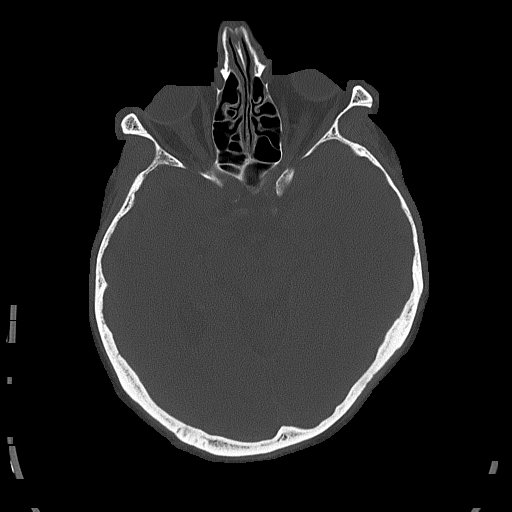
[im 51/85  bone]
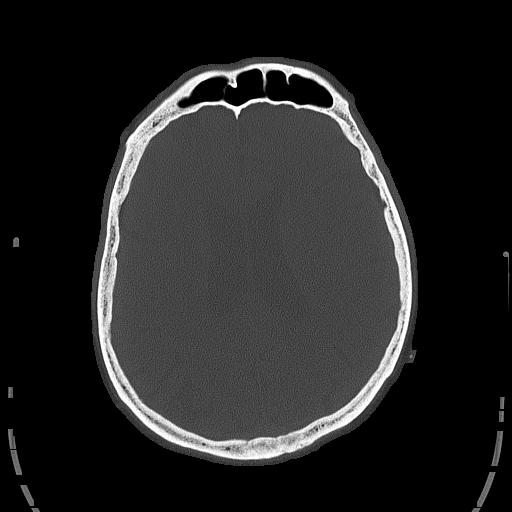
[im 59/85  bone]
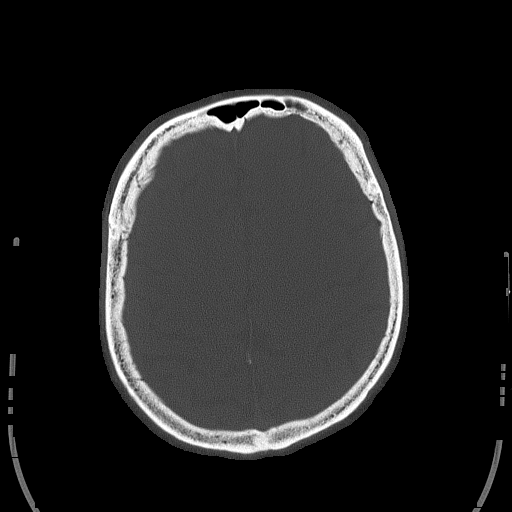
[im 68/85  bone]
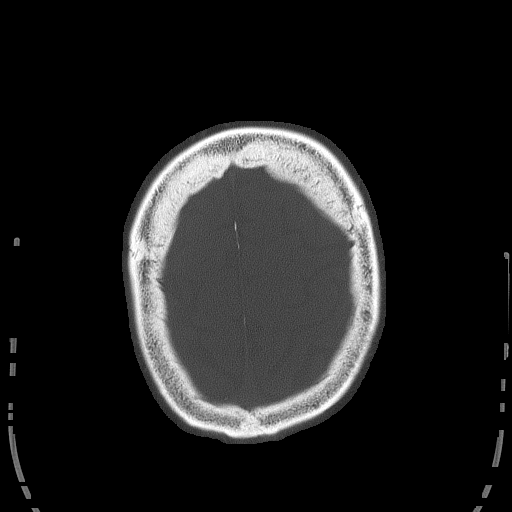

[Series 4: head without · axial · non-contrast · 0.46mm/px · z∈[-62,-7]mm · 2 of 33 slices shown, 3 images]
[im 11/33  brain]
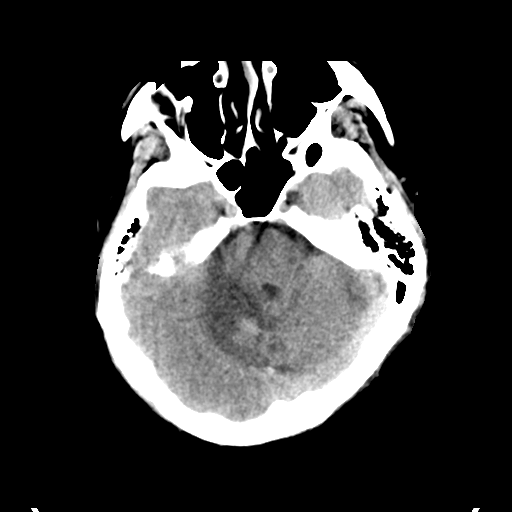
[im 11/33  bone]
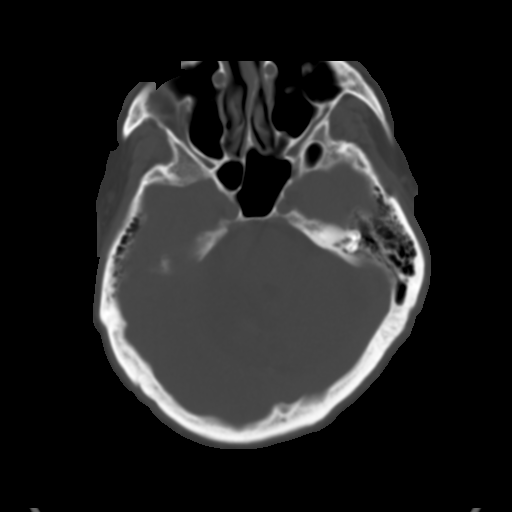
[im 22/33  brain]
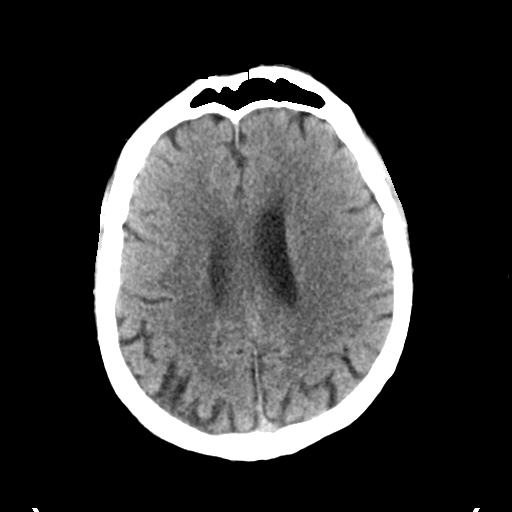

[Series 5: head without cor · coronal · non-contrast · 0.36mm/px · 3 of 70 slices shown]
[im 26/70  brain]
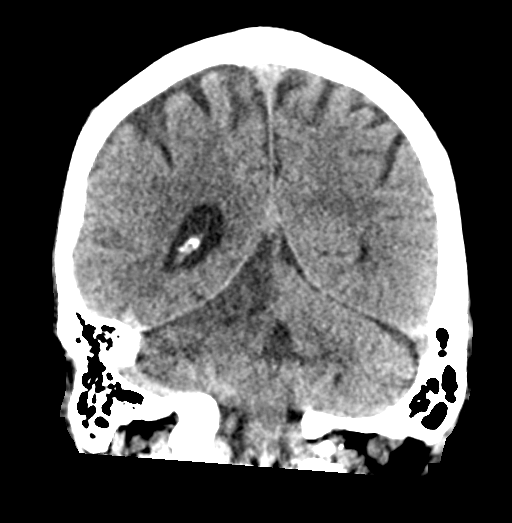
[im 32/70  brain]
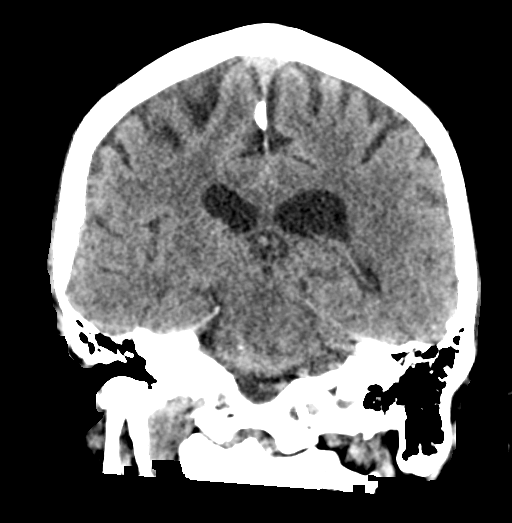
[im 38/70  brain]
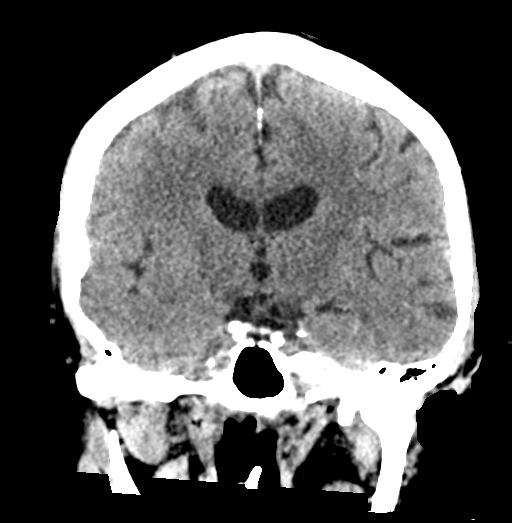

[Series 6: head without sag · sagittal · non-contrast · 0.34mm/px · 3 of 57 slices shown]
[im 19/57  brain]
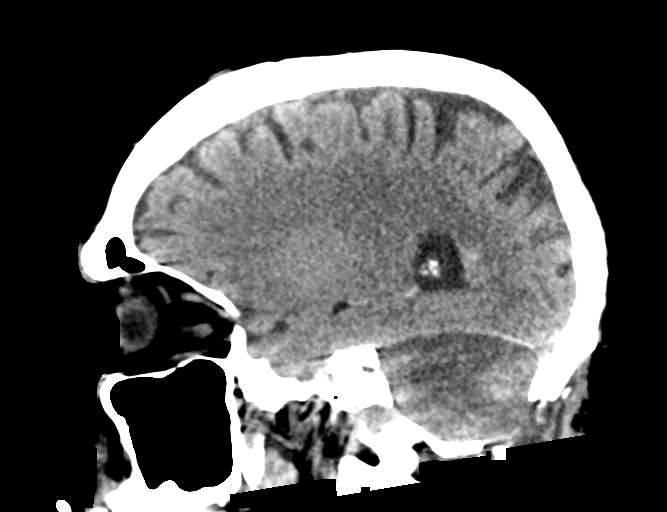
[im 29/57  brain]
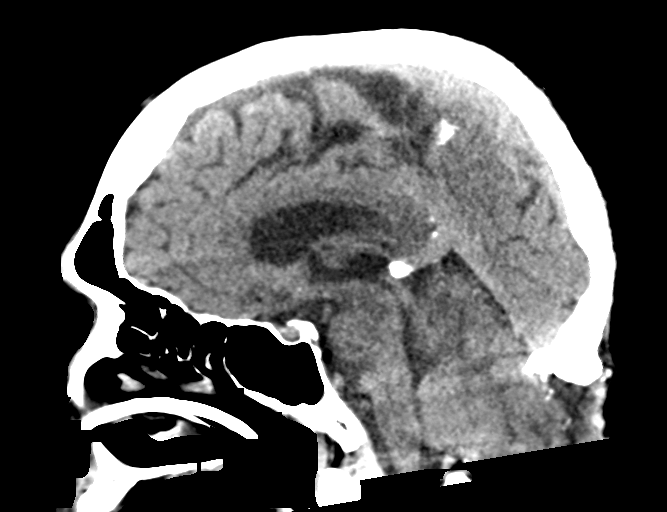
[im 38/57  brain]
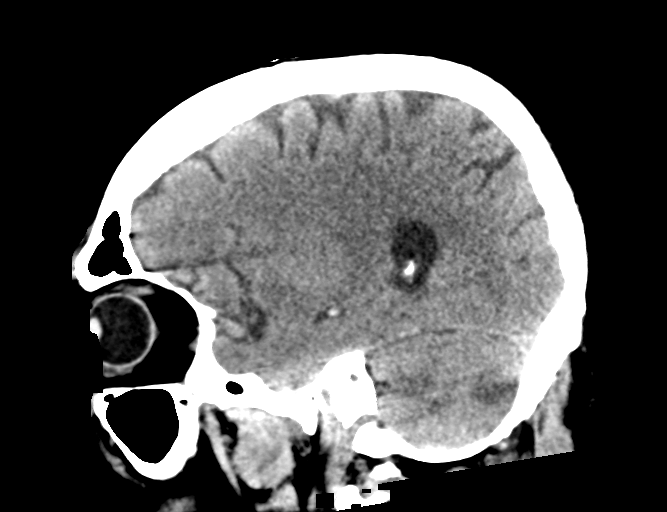

[Series 7: head without ax · axial · non-contrast · 0.36mm/px · z∈[-30,+21]mm · 2 of 34 slices shown]
[im 12/34  brain]
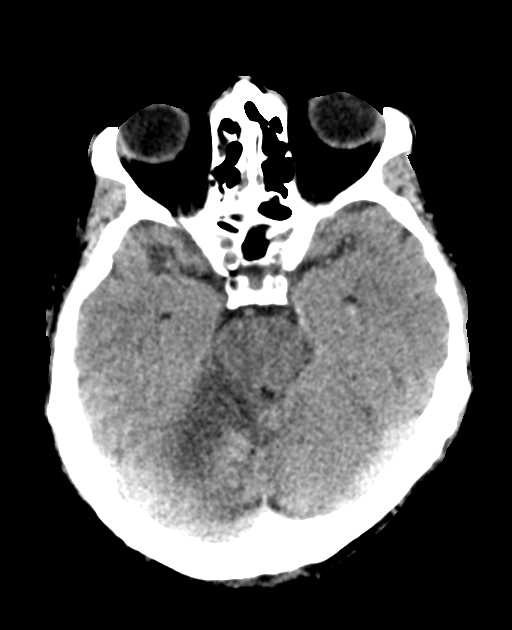
[im 23/34  brain]
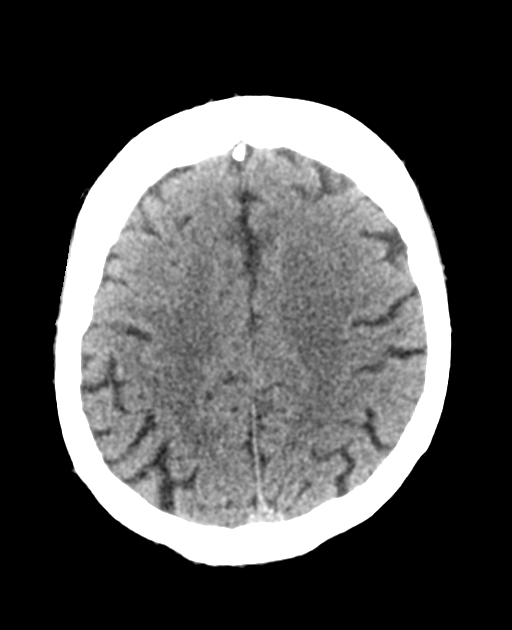

[17 of 47 positions shown; findings below may reference images not displayed]

FINDINGS: Brain: Again seen is an evolving infarct in the right cerebellar
hemisphere with associated hemorrhage. The hemorrhage is not
significantly changed compared to the CT from 1 day prior. The
infarcted tissue appears more hyperdense consistent with expected
evolution. There is swelling of the affected brain with partial
effacement of the fourth ventricle and protrusion posteriorly
through the craniectomy defect, not significantly changed. There is
no upstream hydrocephalus. The smaller infarct in the left
cerebellar hemisphere is unchanged.

There is no evidence of new infarct or hemorrhage. There is no acute
extra-axial fluid collection. Ventricles are stable in size.

There is no mass lesion.  There is no midline shift.

Vascular: No hyperdense vessel or unexpected calcification.

Skull: Postsurgical changes reflecting suboccipital craniectomy are
again seen.

Sinuses/Orbits: The paranasal sinuses are clear. The globes and
orbits are unremarkable.

Other: A nasoenteric catheter is partially imaged.
IMPRESSION: Ongoing evolution of the right cerebellar hemisphere infarct with
hemorrhagic transformation, with no significant interval change in
the extent of blood products and unchanged partial effacement of
fourth ventricle with no upstream hydrocephalus.

## 2021-12-02 MED ORDER — NOREPINEPHRINE 4 MG/250ML-% IV SOLN
INTRAVENOUS | Status: AC
  Administered 2021-12-02: 19:00:00 10 ug/min via INTRAVENOUS
  Filled 2021-12-02: qty 250

## 2021-12-02 MED ORDER — QUETIAPINE FUMARATE 25 MG PO TABS
12.5000 mg | ORAL_TABLET | Freq: Every day | ORAL | Status: DC
Start: 2021-12-02 — End: 2021-12-05
  Administered 2021-12-02 – 2021-12-04 (×3): 12.5 mg
  Filled 2021-12-02 (×3): qty 1

## 2021-12-02 MED ORDER — NOREPINEPHRINE 4 MG/250ML-% IV SOLN: 2.0000 ug/min | INTRAVENOUS | Status: DC

## 2021-12-02 MED ORDER — QUETIAPINE FUMARATE 25 MG PO TABS
75.0000 mg | ORAL_TABLET | Freq: Every day | ORAL | Status: DC
Start: 2021-12-02 — End: 2021-12-02

## 2021-12-02 MED ORDER — MELATONIN 5 MG PO TABS
5.0000 mg | ORAL_TABLET | Freq: Every evening | ORAL | Status: DC | PRN
  Administered 2021-12-02 – 2021-12-20 (×11): 5 mg
  Filled 2021-12-02 (×11): qty 1

## 2021-12-02 MED ORDER — HYDRALAZINE HCL 50 MG PO TABS
100.0000 mg | ORAL_TABLET | Freq: Three times a day (TID) | ORAL | Status: DC
  Administered 2021-12-02 – 2021-12-04 (×6): 100 mg
  Filled 2021-12-02 (×7): qty 2

## 2021-12-02 MED ORDER — HYDRALAZINE HCL 50 MG PO TABS
50.0000 mg | ORAL_TABLET | Freq: Three times a day (TID) | ORAL | Status: DC
Start: 2021-12-02 — End: 2021-12-02

## 2021-12-02 MED ORDER — CARVEDILOL 12.5 MG PO TABS
12.5000 mg | ORAL_TABLET | Freq: Two times a day (BID) | ORAL | Status: DC
  Administered 2021-12-02 – 2021-12-04 (×4): 12.5 mg
  Filled 2021-12-02 (×4): qty 1

## 2021-12-02 MED ORDER — LACTATED RINGERS IV BOLUS
1000.0000 mL | Freq: Once | INTRAVENOUS | Status: AC
  Administered 2021-12-02: 19:00:00 1000 mL via INTRAVENOUS

## 2021-12-02 MED ORDER — SODIUM CHLORIDE 0.9 % IV SOLN
250.0000 mL | INTRAVENOUS | Status: DC
  Administered 2021-12-02: 20:00:00 250 mL via INTRAVENOUS

## 2021-12-02 MED ORDER — QUETIAPINE FUMARATE 100 MG PO TABS
100.0000 mg | ORAL_TABLET | Freq: Every day | ORAL | Status: DC
  Administered 2021-12-02 – 2021-12-04 (×3): 100 mg
  Filled 2021-12-02 (×4): qty 1

## 2021-12-02 NOTE — Progress Notes (Signed)
Cleviprex stopped at 1745. BP stable at <160 Ordered evening enteral medications given.  Patient agitated, restless, noted to be soiled and changed by nursing staff. Neuro assessment unchanged at this time, patient following commands, MAE's, aphasic but communicating with staff and wife at bedside.  By 3014 patient lethargic, difficult to arouse, not following commands. BP 82/45 MAP 55 HR 73. CCM aware, patient bolused, levo started, and STAT CTH obtained.  Upon return to floor, patient returned to baseline. Levo being titrated.  BP 157/71 MAP 92 RR 25 Spo2 100% on 2L [place]

## 2021-12-02 NOTE — Progress Notes (Signed)
An USGPIV (ultrasound guided PIV) has been placed for short-term vasopressor infusion. A correctly placed ivWatch must be used when administering Vasopressors. Should this treatment be needed beyond 72 hours, central line access should be obtained.  It will be the responsibility of the bedside nurse to follow best practice to prevent extravasations.   ?

## 2021-12-02 NOTE — Progress Notes (Signed)
NAME:  [name], MRN:  [id number], DOB:  03-19-1946, LOS: 7 ADMISSION DATE:  11/25/2021, CONSULTATION DATE:  12/13 REFERRING MD:  Marcelle Overlie, CHIEF COMPLAINT:  Stroke symptoms   History of Present Illness:  75 y/o male admitted with a cerebellar stroke on 12/11, received TNKase, he developed increasing mass effect on the 4th ventricle, moved to the ICU.  Pertinent  Medical History  CAD, s/p CABG  HLD  OA,  Nephrolithiasis BPH  Prostate cancer   Interim History / Subjective:  Patient remained agitated and confused Precedex had to be restarted again at night, despite getting Seroquel 6 at bedtime  Also remains on clevidipine infusion as  Objective   Blood pressure (!) 150/111, pulse 84, temperature 98.2 F (36.8 C), temperature source Axillary, resp. rate (!) 25, height 5\' 10"  (1.778 m), weight 98.1 kg, SpO2 91 %.        Intake/Output Summary (Last 24 hours) at 12/02/2021 1053 Last data filed at 12/02/2021 0900 Gross per 24 hour  Intake 2857.42 ml  Output 1385 ml  Net 1472.42 ml   Filed Weights   11/25/21 1200  Weight: 98.1 kg    Examination: Physical exam: General: Acutely ill-appearing male, lying on the bed HEENT: [place]/AT, eyes anicteric.  Severely dry mucus membranes Neuro: Awake but confused, dysarthric and dysmetric.  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 and compression of ventral pons s/p posterior decompression with craniectomy Hemorrhagic conversion of R cerebellar infarct Continue aggressive PT OT Will need rehab post hospital, looking at [facility] inpatient rehab Continue aspirin, holding Plavix due to hemorrhagic conversion Stroke team is following Repeat head CT on 12/17,  showed stability  Dysphagia Micro aspirations Modified barium swallow showing visible aspiration Cortrak was placed Continue tube feeds   Right lower lobe pneumonia Patient became afebrile after the start of Unasyn Continue Unasyn for 7 days  Chronic HFpEF Hypertension, uncontrolled CAD Still requiring Cleviprex infusion Goal SBP < 140 Continue losartan and clonidine Started on Coreg and hydralazine Lipitor Will try to taper off clevidipine infusion  Induced hypernatremia Hypokalemia, corrected Hypocalcemia Monitor BMET and UOP Replace electrolytes as needed Serum sodium went up to 160 3% saline was stopped this morning Closely monitor serum sodium and other electrolytes  Hyperactive delirium Patient remained restless and agitated Had to be restarted back on Precedex infusion overnight Continue Seroquel 100 mg nightly and 12.5 mg during daytime Continue Precedex as needed to prevent self-harm  Proximal atrial flutter Continue diltiazem  BPH flomax   Best Practice (right click and "Reselect all SmartList Selections" daily)   Diet/type: NPO tube feeds DVT prophylaxis: SCD GI prophylaxis: PPI Lines: N/A Foley:  N/A Code Status:  full code Last date of multidisciplinary goals of care discussion [12/18: OK with CPR and short term intubation but would not want prolonged life support.  Would reconsider need for mechanical ventilation if no improvement after 5-7 days.]  Labs   CBC: Recent Labs  Lab 11/26/21 0512 11/27/21 0309 11/27/21 0311 11/28/21 0331 11/29/21 0617 11/30/21 0409  WBC 13.3*  --  12.1* 13.6* 10.0 9.9  HGB 15.6 14.3 14.3 14.6 12.7* 15.6  HCT 45.5 42.0 43.0 43.0 38.9* 45.4  MCV 89.6  --  92.1 91.5 92.2 91.3  PLT  232  --  217 195 187 244    Basic Metabolic Panel: Recent Labs  Lab 11/27/21 0311 11/27/21 1059 11/28/21 0331 11/28/21 1002 11/29/21 0617 11/29/21 1106 11/30/21 0409 11/30/21 1005 11/30/21 1320 11/30/21 1659  11/30/21 2159 12/01/21 0329 12/01/21 0348 12/01/21 0516 12/01/21 1605 12/02/21 0349  NA 142   < > 152*   < > 158*   < > 152*   < >  --  147* 147* 152*  --  153*  --  160*  K 3.7  --  3.2*  --  3.4*  --  4.3  --   --   --   --  4.5  --   --   --   --   CL 112*  --  121*  --  127*  --  123*  --   --   --   --  126*  --   --   --   --   CO2 22  --  24  --  22  --  19*  --   --   --   --  19*  --   --   --   --   GLUCOSE 131*  --  124*  --  109*  --  103*  --   --   --   --  112*  --   --   --   --   BUN 17  --  15  --  20  --  19  --   --   --   --  18  --   --   --   --   CREATININE 0.89  --  1.12  --  1.08  --  0.96  --   --   --   --  1.09  --   --   --   --   CALCIUM 7.8*  --  7.9*  --  8.1*  --  7.9*  --   --   --   --  7.2*  --   --   --   --   MG  --   --   --   --   --   --   --   --  2.6* 2.8*  --   --  2.5*  --  2.5*  --   PHOS  --   --   --   --   --   --   --   --  2.3* 2.5  --   --  3.0  --  3.2  --    < > = values in this interval not displayed.   GFR: Estimated Creatinine Clearance: 69.8 mL/min (by C-G formula based on SCr of 1.09 mg/dL). Recent Labs  Lab 11/27/21 0311 11/28/21 0331 11/29/21 0617 11/30/21 0409  WBC 12.1* 13.6* 10.0 9.9    Liver Function Tests: No results for input(s): AST, ALT, ALKPHOS, BILITOT, PROT, ALBUMIN in the last 168 hours.  No results for input(s): LIPASE, AMYLASE in the last 168 hours. No results for input(s): AMMONIA in the last 168 hours.  ABG    Component Value Date/Time   PHART 7.391 11/27/2021 0309   PCO2ART 38.7 11/27/2021 0309   PO2ART 212 (H) 11/27/2021 0309   HCO3 23.6 11/27/2021 0309   TCO2 25 11/27/2021 0309   ACIDBASEDEF 1.0 11/27/2021 0309   O2SAT 100.0 11/27/2021 0309     Coagulation Profile: No results for  input(s): INR, PROTIME in the last 168 hours.   Cardiac Enzymes: No results for input(s): CKTOTAL, CKMB, CKMBINDEX, TROPONINI in the last 168 hours.  HbA1C: Hgb A1c MFr Bld  Date/Time Value Ref Range  Status  11/26/2021 05:12 AM 5.8 (H) 4.8 - 5.6 % Final    Comment:    (NOTE) Pre diabetes:          5.7%-6.4%  Diabetes:              >6.4%  Glycemic control for   <7.0% adults with diabetes     CBG: Recent Labs  Lab 12/01/21 1528 12/01/21 2020 12/02/21 0107 12/02/21 0436 12/02/21 0747  GLUCAP 126* 129* 147* 116* 126*    Critical care time:     Total critical care time: 32 minutes  Performed by: [place] care time was exclusive of separately billable procedures and treating other patients.   Critical care was necessary to treat or prevent imminent or life-threatening deterioration.   Critical care was time spent personally by me on the following activities: development of treatment plan with patient and/or surrogate as well as nursing, discussions with consultants, evaluation of patient's response to treatment, examination of patient, obtaining history from patient or surrogate, ordering and performing treatments and interventions, ordering and review of laboratory studies, ordering and review of radiographic studies, pulse oximetry and re-evaluation of patient's condition.   Jacky Kindle MD [facility] See Amion for pager If no response to pager, please call [phone] until 7pm After 7pm, Please call E-link [phone]

## 2021-12-02 NOTE — Progress Notes (Signed)
STROKE TEAM PROGRESS NOTE   INTERVAL HISTORY Patient is seen in his room with his wife [name]  at the bedside.  Patient was still restless overnight, but he said he did not sleep well despite Seroquel 50 mg at bedtime and had to be restarted on Precedex.  He did not pass his modified barium swallow screen yesterday and does have a cortrak in place.  Marland Kitchen  He is still on Cleviprex drip for blood pressure control but there is seems to be discordance between cuff pressure and A-line.  Vitals:   12/02/21 1200 12/02/21 1215 12/02/21 1230 12/02/21 1245  BP: 139/64 127/60 135/68 137/61  Pulse: 87 86 87 86  Resp: (!) 31 (!) 25 (!) 36 (!) 35  Temp: 99.5 F (37.5 C)     TempSrc: Oral     SpO2: (!) 89% (!) 88% 91% 91%  Weight:      Height:       CBC:  Recent Labs  Lab 11/29/21 0617 11/30/21 0409  WBC 10.0 9.9  HGB 12.7* 15.6  HCT 38.9* 45.4  MCV 92.2 91.3  PLT 187 737   Basic Metabolic Panel:  Recent Labs  Lab 11/30/21 0409 11/30/21 1005 12/01/21 0329 12/01/21 0348 12/01/21 0516 12/01/21 1605 12/02/21 0349  NA 152*   < > 152*  --  153*  --  160*  K 4.3  --  4.5  --   --   --   --   CL 123*  --  126*  --   --   --   --   CO2 19*  --  19*  --   --   --   --   GLUCOSE 103*  --  112*  --   --   --   --   BUN 19  --  18  --   --   --   --   CREATININE 0.96  --  1.09  --   --   --   --   CALCIUM 7.9*  --  7.2*  --   --   --   --   MG  --    < >  --  2.5*  --  2.5*  --   PHOS  --    < >  --  3.0  --  3.2  --    < > = values in this interval not displayed.   Lipid Panel:  Recent Labs  Lab 11/26/21 0512  CHOL 164  TRIG 52  HDL 48  CHOLHDL 3.4  VLDL 10  LDLCALC 106*   HgbA1c:  Recent Labs  Lab 11/26/21 0512  HGBA1C 5.8*   Urine Drug Screen:  Recent Labs  Lab 11/25/21 1646  LABOPIA NONE DETECTED  COCAINSCRNUR NONE DETECTED  LABBENZ NONE DETECTED  AMPHETMU NONE DETECTED  THCU NONE DETECTED  LABBARB NONE DETECTED    Alcohol Level No results for input(s): ETH in the  last 168 hours.  IMAGING past 24 hours No results found.  PHYSICAL EXAM  Temp:  [98.1 F (36.7 C)-99.5 F (37.5 C)] 99.5 F (37.5 C) (12/18 1200) Pulse Rate:  [71-87] 86 (12/18 1245) Resp:  [15-40] 35 (12/18 1245) BP: (92-172)/(55-118) 137/61 (12/18 1245) SpO2:  [88 %-99 %] 91 % (12/18 1245)  General - Well nourished, well developed elderly Caucasian male, in no apparent distress. Cardiovascular - Regular rhythm and rate. Respiratory- respirations even and unlabored  NEURO:  Mental Status: AA&Ox3 Speech/Language:  Able to  speak short phrases, mild nonfluent speech does have some difficulty finding the correct word and some confusion.  Disoriented.  Easy distractibility.  Diminished attention, registration and recall. Cranial Nerves II - XII - II -no visual field deficit III, IV, VI -full range but had saccadic dysmetria on lateral gaze to the right V - Facial sensation intact bilaterally. VII - Facial movement intact bilaterally. VIII - Hearing & vestibular intact bilaterally. X - Palate elevates symmetrically. XI - Chin turning & shoulder shrug intact bilaterally. XII - Tongue protrusion intact.   Motor Strength -right upper extremity and right lower extremity slightly weaker than left. Motor Tone - Muscle tone was assessed at the neck and appendages and was normal. Reflexes - The patients reflexes were symmetrical in all extremities and he had no pathological reflexes. Sensory - Light touch, temperature/pinprick were assessed and were symmetrical.   Coordination -difficulty with finger-to-nose and medial coordination on the right gait and Station - deferred.   ASSESSMENT/PLAN [name] is a 75 y.o. male with history of CABG x4, hyperlipidemia, prostate cancer, internal hemorrhoid, and a nonobstructive calculus in right interpole with hematuria presenting with acute onset vertigo nausea vomiting and an inability to walk.  At 10 AM on November 25, 2021 patient  developed vertigo nausea and vomiting.  He also noted abdominal discomfort.  A code stroke was called on his arrival to triage.  Once the patient was in the CT scanner he developed more nausea and vomiting he went to a stat MRI which was positive for bilateral cerebellar strokes.  He then developed ataxia in his bilateral upper and lower extremities.  He was then given TNKase and went for a stat CTA of the head.  No LVO was shown on the CTA. Repeat MRI showed progression of edema related to the cerebellar infarcts with mass effect on the 4th ventricle. Neurosurgery consulted and hypertonic saline initiated. 12/13 suboccipital craniectomy overnight.  Patient tolerated procedure well.  Speech therapy recommends resuming nectar thickened liquids with his diet.  Repeat CT 12/14 shows hemorrhagic transformation of patient's stroke.  Lovenox resumed today as he is post 24 hours from hemorrhagic transformation of his stroke.  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 ASPECTS 10.    Repeat CT-extensive swelling in the areas of the acute cerebellar infarction more extensive on the right than the left.  Mass-effect on the fourth ventricle.  No evidence of ventricular obstruction.  Stable lateral and third ventricles. Post craniectomy CT-Unchanged right greater than left cerebellar infarcts.  No new infarction.  Unchanged narrowing of the fourth ventricle.  No evidence of ventricular obstruction or hydrocephalus. Repeat CT 12/14 New hyperdensity in right cerebellar infarct, likely hemorrhage with no increased mass effect. CTA head & neck unremarkable MRI  acute infarct of the cerebellum bilaterally.  negative for hemorrhage.  MRI Repeat 12/12- Interval progression of edema associated to bilateral cerebellar acute infarcts with mass effect on the fourth ventricle and low lying  right cerebellar tonsil. No hydrocephalus CT head 12/14 -right cerebellum hemorrhagic transformation without hydrocephalus CT repeat pending 2D Echo EF 70-75% LDL 106 HgbA1c 5.8 VTE prophylaxis - SCDs aspirin 81 mg daily prior to admission, now on aspirin 81, lovenox and plavix discontinued due to hemorrhagic transformation of cerebellar stroke. Will consider DOAC in 14 days post stroke if neuro and CT stable Therapy recommendations: Inpatient rehab- [facility] rehab closer to patients home  Disposition: Pending  Cerebellar Edema s/p suboccipital decompression  3% normal saline initiated on 12/12 Sodium goal is 150-155 Na 154 S/p suboccipital decompression with Dr. Zada Finders 3% d/c'd and then resumed due to sodium levels Close neuro monitoring CT 12/13 reveals unchanged infarcts without evidence of hydrocephalus CT head 12/14 - right cerebellum hemorrhagic transformation without hydrocephalus CT head 12/17- Stable cerebellar infarct with hemorrhage.  No hydrocephalus or interval change Titrate down 3% normal saline overnight.  Orders placed  Hypertension Home meds: None Stable on the high end BP <160  Continue cozaar 50 bid, cardizem 60 Q8 Add hydralazine 25 Q8 Taper off Cleviprex as able  Atrial flutter - new diagnosis Cardiology on board IV Cardizem changed to p.o. 60 mg Q8 As needed metoprolol for rate control Consider DOAC in 14 days post stroke if neuro and CT stable  Fever Leukocytosis T-max 101.5->101.3-> 99.3->101.9 WBC 13.3->13.6->10.0->9.9 CXR 12/13 - Left lower lobe consolidation with small adjacent left pleural effusion. Findings are likely either due to pneumonia or aspiration. On unasyn   Hyperlipidemia Home meds: Atorvastatin 80 mg, resumed in hospital LDL 106, goal < 70 Atorvastatin 80 mg daily Continue statin at discharge  Delirium/agitation Encephalopathy Sundowning On precedex, taper off as able On seroquel 25 Qhs-> increase to 50 tonight Could be due  to increased ICP vs. Fever/aspiration pneumonia Repeat CT 12/14 reveals no hydrocephalus Emphasize sleep hygiene  Dysphagia NPO for now MBSS done 12/16 [place] ordered. Patient is only able to have sparing thickened liquids currently Will start TF and meds via cortrak  Other Stroke Risk Factors Advanced Age >/= 66  Obesity, Body mass index is 31.03 kg/m., BMI >/= 30 associated with increased.  Use of oral blood pressure medications.  Risk, recommend weight loss, diet and exercise as appropriate  Coronary artery disease s/p CABG  Other Active Problems Abdominal discomfort CTA abdomen pelvis-no evidence of active GI bleed.  Moderate aortic atherosclerosis.  Diverticulosis with no evidence of diverticulitis. Internal hemorrhoid Nonobstructive calculus in right interpole Hematuria one month ago Flomax DC'd and changed to Urecholine 10 mg 3 times daily   Hospital day # 7  Patient remains confused and agitated requiring Seroquel and Precedex.  Plan to wean discontinue Precedex and increase Seroquel dose to 12. 5 mg in the morning.  And 100 mg at night.  Change systolic blood pressure goal to below 180 and Cleviprex drip and use as needed blood pressure medicines and oral medications with systolic goal below 573.  Mobilize out of bed.  Therapy consults.  Hopefully transfer out of ICU in the next couple of days and then to rehab.  Long discussion with patient's wife  at the bedside.  They prefer inpatient rehab [facility] area where they live.  Discussed with Dr. Tacy Learn critical care medicine.This patient is critically ill and at significant risk of neurological worsening, death and care requires constant monitoring of vital signs, hemodynamics,respiratory and cardiac monitoring, extensive review of multiple databases, frequent neurological assessment, discussion with family, other specialists and medical decision making of high complexity.I have made any additions or clarifications directly to the  above note.This critical care time does not reflect procedure time, or teaching time or supervisory time of PA/NP/Med Resident etc but could involve care discussion time.  I spent 30 minutes of neurocritical care time  in the care of  this patient.      Antony Contras, MD Medical Director [facility] Pager: [phone] 12/02/2021 1:49 PM    To contact Stroke Continuity  provider, please refer to [email or web address] After hours, contact General Neurology

## 2021-12-02 NOTE — Progress Notes (Signed)
Patient ID: [name], male   DOB: 01/06/1946, 75 y.o.   MRN: [id number] Patient remains confused but neurologically at baseline.  Moves all extremities no new neurosurgical recommendations

## 2021-12-03 DIAGNOSIS — I4892 Unspecified atrial flutter: Secondary | ICD-10-CM

## 2021-12-03 LAB — BASIC METABOLIC PANEL
Anion gap: 10 (ref 5–15)
BUN: 19 mg/dL (ref 8–23)
CO2: 23 mmol/L (ref 22–32)
Calcium: 8 mg/dL — ABNORMAL LOW (ref 8.9–10.3)
Chloride: 126 mmol/L — ABNORMAL HIGH (ref 98–111)
Creatinine, Ser: 1.22 mg/dL (ref 0.61–1.24)
GFR, Estimated: >60 mL/min (ref >60)
Glucose, Bld: 116 mg/dL — ABNORMAL HIGH (ref 70–99)
Potassium: 3.5 mmol/L (ref 3.5–5.1)
Sodium: 159 mmol/L — ABNORMAL HIGH (ref 135–145)

## 2021-12-03 LAB — CBC
HCT: 44.4 % (ref 39.0–52.0)
Hemoglobin: 14.2 g/dL (ref 13.0–17.0)
MCH: 29.8 pg (ref 26.0–34.0)
MCHC: 32 g/dL (ref 30.0–36.0)
MCV: 93.1 fL (ref 80.0–100.0)
Platelets: 197 10*3/uL (ref 150–400)
RBC: 4.77 MIL/uL (ref 4.22–5.81)
RDW: 16 % — ABNORMAL HIGH (ref 11.5–15.5)
WBC: 8.1 10*3/uL (ref 4.0–10.5)
nRBC: 0 % (ref 0.0–0.2)

## 2021-12-03 LAB — GLUCOSE, CAPILLARY
Glucose-Capillary: 107 mg/dL — ABNORMAL HIGH (ref 70–99)
Glucose-Capillary: 100 mg/dL — ABNORMAL HIGH (ref 70–99)
Glucose-Capillary: 93 mg/dL (ref 70–99)
Glucose-Capillary: 98 mg/dL (ref 70–99)
Glucose-Capillary: 112 mg/dL — ABNORMAL HIGH (ref 70–99)
Glucose-Capillary: 118 mg/dL — ABNORMAL HIGH (ref 70–99)

## 2021-12-03 LAB — SODIUM: Sodium: 159 mmol/L — ABNORMAL HIGH (ref 135–145)

## 2021-12-03 LAB — MAGNESIUM: Magnesium: 2.5 mg/dL — ABNORMAL HIGH (ref 1.7–2.4)

## 2021-12-03 LAB — PHOSPHORUS: Phosphorus: 2.8 mg/dL (ref 2.5–4.6)

## 2021-12-03 MED ORDER — ENOXAPARIN SODIUM 40 MG/0.4ML IJ SOSY
40.0000 mg | PREFILLED_SYRINGE | Freq: Every day | INTRAMUSCULAR | Status: DC
  Administered 2021-12-03 – 2021-12-17 (×14): 40 mg via SUBCUTANEOUS
  Filled 2021-12-03 (×14): qty 0.4

## 2021-12-03 NOTE — Progress Notes (Signed)
STROKE TEAM PROGRESS NOTE   INTERVAL HISTORY Patient is seen in his room with his wife at the bedside.  Patient had transient hypotensive episode with blood pressure dropping to systolic 28Z and became lethargic and stat CT scan of the head showed no acute abnormality and was transiently started on IV Levophed with improvement in his blood pressure as well as mental status and neurological exam.  This morning he is doing better is off vasopressors with good blood pressure and neurological exam is at baseline.  Still confused, disoriented and intermittently agitated but was able to participate with physical therapist earlier.  Family prefers inpatient rehab in [place]. Vitals:   12/03/21 1154 12/03/21 1200 12/03/21 1300 12/03/21 1400  BP:  (!) 145/65 (!) 156/66 (!) 169/84  Pulse:  66 68 69  Resp:  (!) 24 (!) 31 (!) 24  Temp: 98.3 F (36.8 C)     TempSrc: Oral     SpO2:  95% 95% 94%  Weight:      Height:       CBC:  Recent Labs  Lab 11/30/21 0409 12/03/21 0927  WBC 9.9 8.1  HGB 15.6 14.2  HCT 45.4 44.4  MCV 91.3 93.1  PLT 211 662   Basic Metabolic Panel:  Recent Labs  Lab 12/01/21 0329 12/01/21 0348 12/01/21 1605 12/02/21 0349 12/03/21 0434 12/03/21 0927  NA 152*   < >  --    < > 159* 159*  K 4.5  --   --   --   --  3.5  CL 126*  --   --   --   --  126*  CO2 19*  --   --   --   --  23  GLUCOSE 112*  --   --   --   --  116*  BUN 18  --   --   --   --  19  CREATININE 1.09  --   --   --   --  1.22  CALCIUM 7.2*  --   --   --   --  8.0*  MG  --    < > 2.5*  --   --  2.5*  PHOS  --    < > 3.2  --   --  2.8   < > = values in this interval not displayed.   Lipid Panel:  No results for input(s): CHOL, TRIG, HDL, CHOLHDL, VLDL, LDLCALC in the last 168 hours.  HgbA1c:  No results for input(s): HGBA1C in the last 168 hours.  Urine Drug Screen:  No results for input(s): LABOPIA, COCAINSCRNUR, LABBENZ, AMPHETMU, THCU, LABBARB in the last 168 hours.   Alcohol Level No  results for input(s): ETH in the last 168 hours.  IMAGING past 24 hours CT HEAD WO CONTRAST (5MM)  Result Date: 12/02/2021 CLINICAL DATA:  Right cerebellar infarct with hemorrhage, follow-up EXAM: CT HEAD WITHOUT CONTRAST TECHNIQUE: Contiguous axial images were obtained from the base of the skull through the vertex without intravenous contrast. COMPARISON:  CT head dated 1 day prior FINDINGS: Brain: Again seen is an evolving infarct in the right cerebellar hemisphere with associated hemorrhage. The hemorrhage is not significantly changed compared to the CT from 1 day prior. The infarcted tissue appears more hyperdense consistent with expected evolution. There is swelling of the affected brain with partial effacement of the fourth ventricle and protrusion posteriorly through the craniectomy defect, not significantly changed. There is no upstream hydrocephalus. The smaller  infarct in the left cerebellar hemisphere is unchanged. There is no evidence of new infarct or hemorrhage. There is no acute extra-axial fluid collection. Ventricles are stable in size. There is no mass lesion.  There is no midline shift. Vascular: No hyperdense vessel or unexpected calcification. Skull: Postsurgical changes reflecting suboccipital craniectomy are again seen. Sinuses/Orbits: The paranasal sinuses are clear. The globes and orbits are unremarkable. Other: A nasoenteric catheter is partially imaged. IMPRESSION: Ongoing evolution of the right cerebellar hemisphere infarct with hemorrhagic transformation, with no significant interval change in the extent of blood products and unchanged partial effacement of fourth ventricle with no upstream hydrocephalus. Electronically Signed   By: Valetta Mole M.D.   On: 12/02/2021 18:52    PHYSICAL EXAM  Temp:  [97.7 F (36.5 C)-100.8 F (38.2 C)] 98.3 F (36.8 C) (12/19 1154) Pulse Rate:  [62-85] 69 (12/19 1400) Resp:  [18-44] 24 (12/19 1400) BP: (82-188)/(45-147) 169/84 (12/19  1400) SpO2:  [87 %-100 %] 94 % (12/19 1400)  General - Well nourished, well developed elderly Caucasian male, in no apparent distress. Cardiovascular - Regular rhythm and rate. Respiratory- respirations even and unlabored  NEURO:  Mental Status: AA&Ox3 Speech/Language:  Able to speak short phrases, mild nonfluent speech does have some difficulty finding the correct word and some confusion.  Disoriented.  Easy distractibility.  Diminished attention, registration and recall. Cranial Nerves II - XII - II -no visual field deficit III, IV, VI -full range but had saccadic dysmetria on lateral gaze to the right V - Facial sensation intact bilaterally. VII - Facial movement intact bilaterally. VIII - Hearing & vestibular intact bilaterally. X - Palate elevates symmetrically. XI - Chin turning & shoulder shrug intact bilaterally. XII - Tongue protrusion intact.   Motor Strength -right upper extremity and right lower extremity slightly weaker than left. Motor Tone - Muscle tone was assessed at the neck and appendages and was normal. Reflexes - The patients reflexes were symmetrical in all extremities and he had no pathological reflexes. Sensory - Light touch, temperature/pinprick were assessed and were symmetrical.   Coordination -difficulty with finger-to-nose and medial coordination on the right gait and Station - deferred.   ASSESSMENT/PLAN [name] is a 75 y.o. male with history of CABG x4, hyperlipidemia, prostate cancer, internal hemorrhoid, and a nonobstructive calculus in right interpole with hematuria presenting with acute onset vertigo nausea vomiting and an inability to walk.  At 10 AM on November 25, 2021 patient developed vertigo nausea and vomiting.  He also noted abdominal discomfort.  A code stroke was called on his arrival to triage.  Once the patient was in the CT scanner he developed more nausea and vomiting he went to a stat MRI which was positive for bilateral  cerebellar strokes.  He then developed ataxia in his bilateral upper and lower extremities.  He was then given TNKase and went for a stat CTA of the head.  No LVO was shown on the CTA. Repeat MRI showed progression of edema related to the cerebellar infarcts with mass effect on the 4th ventricle. Neurosurgery consulted and hypertonic saline initiated. 12/13 suboccipital craniectomy overnight.  Patient tolerated procedure well.  Speech therapy recommends resuming nectar thickened liquids with his diet.  Repeat CT 12/14 shows hemorrhagic transformation of patient's stroke.  Lovenox resumed today as he is post 24 hours from hemorrhagic transformation of his stroke.  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 ASPECTS 10.    Repeat CT-extensive swelling in the areas of the acute cerebellar infarction more extensive on the right than the left.  Mass-effect on the fourth ventricle.  No evidence of ventricular obstruction.  Stable lateral and third ventricles. Post craniectomy CT-Unchanged right greater than left cerebellar infarcts.  No new infarction.  Unchanged narrowing of the fourth ventricle.  No evidence of ventricular obstruction or hydrocephalus. Repeat CT 12/14 New hyperdensity in right cerebellar infarct, likely hemorrhage with no increased mass effect. CTA head & neck unremarkable MRI  acute infarct of the cerebellum bilaterally.  negative for hemorrhage.  MRI Repeat 12/12- Interval progression of edema associated to bilateral cerebellar acute infarcts with mass effect on the fourth ventricle and low lying right cerebellar tonsil. No hydrocephalus CT head 12/14 -right cerebellum hemorrhagic transformation without hydrocephalus CT repeat pending 2D Echo EF 70-75% LDL 106 HgbA1c 5.8 VTE prophylaxis - SCDs aspirin 81 mg daily prior to admission, now on aspirin  81, lovenox and plavix discontinued due to hemorrhagic transformation of cerebellar stroke. Will consider DOAC in 14 days post stroke if neuro and CT stable Therapy recommendations: Inpatient rehab- [facility] rehab closer to patients home Disposition: Pending  Cerebellar Edema s/p suboccipital decompression  3% normal saline initiated on 12/12 Sodium goal is 150-155 Na 154 S/p suboccipital decompression with Dr. Zada Finders 3% d/c'd and then resumed due to sodium levels Close neuro monitoring CT 12/13 reveals unchanged infarcts without evidence of hydrocephalus CT head 12/14 - right cerebellum hemorrhagic transformation without hydrocephalus CT head 12/17- Stable cerebellar infarct with hemorrhage.  No hydrocephalus or interval change Titrate down 3% normal saline overnight.  Orders placed  Hypertension Home meds: None Stable on the high end BP <160  Continue cozaar 50 bid, cardizem 60 Q8 Add hydralazine 25 Q8 Taper off Cleviprex as able  Atrial flutter - new diagnosis Cardiology on board IV Cardizem changed to p.o. 60 mg Q8 As needed metoprolol for rate control Consider DOAC in 14 days post stroke if neuro and CT stable  Fever Leukocytosis T-max 101.5->101.3-> 99.3->101.9 WBC 13.3->13.6->10.0->9.9 CXR 12/13 - Left lower lobe consolidation with small adjacent left pleural effusion. Findings are likely either due to pneumonia or aspiration. On unasyn   Hyperlipidemia Home meds: Atorvastatin 80 mg, resumed in hospital LDL 106, goal < 70 Atorvastatin 80 mg daily Continue statin at discharge  Delirium/agitation Encephalopathy Sundowning On precedex, taper off as able On seroquel 25 Qhs-> increase to 50 tonight Could be due to increased ICP vs. Fever/aspiration pneumonia Repeat CT 12/14 reveals no hydrocephalus Emphasize sleep hygiene  Dysphagia NPO for now MBSS done 12/16 [place] ordered. Patient is only able to have sparing thickened liquids currently Will start TF and  meds via cortrak  Other Stroke Risk Factors Advanced Age >/= 20  Obesity, Body mass index is 31.03 kg/m., BMI >/= 30 associated with increased.  Use of oral blood pressure medications.  Risk, recommend weight loss, diet and exercise as appropriate  Coronary artery disease s/p CABG  Other Active Problems Abdominal discomfort CTA abdomen pelvis-no evidence of active GI bleed.  Moderate aortic atherosclerosis.  Diverticulosis with no evidence of diverticulitis. Internal hemorrhoid Nonobstructive calculus in right interpole Hematuria one month ago Flomax DC'd and changed to Urecholine 10 mg 3 times daily   Hospital day # 8  Patient remains confused and intermittently slightly agitated requiring Seroquel he became transiently hypotensive yesterday and repeat brain scan showed no acute findings.  He required Levophed for short  period of time he is currently off pressors and hemodynamically and neurologically stable.  Therapy consults.  Hopefully transfer out of ICU in the next couple of days and then to rehab.  Long discussion with patient's wife  at the bedside.  They prefer inpatient rehab [facility] area where they live.  Discussed with Dr. Tacy Learn critical care medicine.This patient is critically ill and at significant risk of neurological worsening, death and care requires constant monitoring of vital signs, hemodynamics,respiratory and cardiac monitoring, extensive review of multiple databases, frequent neurological assessment, discussion with family, other specialists and medical decision making of high complexity.I have made any additions or clarifications directly to the above note.This critical care time does not reflect procedure time, or teaching time or supervisory time of PA/NP/Med Resident etc but could involve care discussion time.  I spent 30 minutes of neurocritical care time  in the care of  this patient.      Antony Contras, MD Medical Director [place] Pager:  [phone] 12/03/2021 2:22 PM    To contact Stroke Continuity provider, please refer to [email or web address] After hours, contact General Neurology

## 2021-12-03 NOTE — Progress Notes (Signed)
Inpatient Rehabilitation Admissions Coordinator   I met at bedside with patient and his wife, patient participating in PT/OT session. Noted progress. Wife requesting to follow up with TOC on possible [facility] admit . I will notify team.  Danne Baxter, RN, MSN Rehab Admissions Coordinator [phone] 12/03/2021 11:35 AM

## 2021-12-03 NOTE — Progress Notes (Signed)
° °  NAME:  [name], MRN:  [id number], DOB:  12/19/1945, LOS: 8 ADMISSION DATE:  11/25/2021, CONSULTATION DATE:  12/13 REFERRING MD:  Marcelle Overlie, CHIEF COMPLAINT:  Stroke symptoms   History of Present Illness:  75 y/o male admitted with a cerebellar stroke on 12/11, received TNKase, he developed increasing mass effect on the 4th ventricle, moved to the ICU.  Pertinent  Medical History  CAD, s/p CABG  HLD  OA,  Nephrolithiasis BPH  Prostate cancer   Interim History / Subjective:  Ongoing delirium.  Episode of hypotension overnight with brief need for Levophed. CT head obtained and stable from prior. BP now stable.  Objective   Blood pressure (!) 147/61, pulse 70, temperature 98.4 F (36.9 C), temperature source Oral, resp. rate (!) 36, height 5\' 10"  (1.778 m), weight 98.1 kg, SpO2 94 %.        Intake/Output Summary (Last 24 hours) at 12/03/2021 7829 Last data filed at 12/03/2021 0600 Gross per 24 hour  Intake 2197.86 ml  Output 1300 ml  Net 897.86 ml    Filed Weights   11/25/21 1200  Weight: 98.1 kg    Examination: General: Acutely ill-appearing male, lying on the bed HEENT: [place]/AT, eyes anicteric.  Dry mucus membranes Neuro: Awake but confused, dysarthric and dysmetric.  Antigravity in all 4 extremities Chest: CTAB Heart: RRR, no M/R/G Abdomen: BS x 4, S/NT/ND Skin: No rash   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 and compression of ventral pons s/p posterior decompression with craniectomy Hemorrhagic conversion of R cerebellar infarct Continue aggressive PT OT Will need rehab post hospital, looking at [facility] inpatient rehab Continue aspirin, holding Plavix due to hemorrhagic conversion Stroke team is following  Dysphagia  - Modified barium swallow showing aspiration, now s/p cortrak Continue tube feeds   Right lower lobe pneumonia Continue Unasyn for 7 days, stop date 12/19  Chronic HFpEF Hypertension,  uncontrolled CAD Goal SBP < 140 Continue Losartan and Clonidine, Coreg, Hydralazine, Lipitor  Induced hypernatremia - hypertonic now off Hypokalemia, corrected Hypocalcemia Check BMP now and again in AM (last was 12/17) Replace electrolytes as needed  Hyperactive delirium Continue Seroquel 100 mg nightly and 12.5 mg during daytime  Proximal atrial flutter Continue Diltiazem  BPH Continue Flomax   Best Practice (right click and "Reselect all SmartList Selections" daily)   Diet/type: NPO tube feeds DVT prophylaxis: LMWH GI prophylaxis: PPI Lines: N/A Foley:  N/A Code Status:  full code Last date of multidisciplinary goals of care discussion [12/18: OK with CPR and short term intubation but would not want prolonged life support.  Would reconsider need for mechanical ventilation if no improvement after 5-7 days.]   Critical care time: 30 min.    Montey Hora, [facility] For pager details, please see AMION or use Epic chat  After 1900, please call [facility] for cross coverage needs 12/03/2021, 8:49 AM

## 2021-12-03 NOTE — Progress Notes (Signed)
Physical Therapy Treatment Patient Details Name: [name] MRN: [id number] DOB: 1946-08-08 Today's Date: 12/03/2021   History of Present Illness 75 yo male presenting to ED on 12/11 with ataxia and vertigo. 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 TNK given at 1214. S/p suboccipital crani for evacuation on 12/13. PMH including CAD s/p CABG x4, HLD, OA, nephrolithiasis, urinary retention, BPH with LUTS and prostate cancer.    PT Comments    Pt seen by PT/OT today, received in bed very restless and seeming to be uncomfortable and unable to settle and relax. Deep pressure given by OT to UE joints with pt showing visible signs of relaxation and relief afterwards. Pt stood EOB x2 with mod A +2 for deep pressure to LE's as well as pressure given at ankle, knee and hip of each extremity after return to supine. Pt's wife reports that pt was unable to sleep last night. Room darkened and soft music played and heavy blankets placed on pt to encourage nap after physical activity. Continue to recommend CIR at d/c. PT will continue to follow.    Recommendations for follow up therapy are one component of a multi-disciplinary discharge planning process, led by the attending physician.  Recommendations may be updated based on patient status, additional functional criteria and insurance authorization.  Follow Up Recommendations  Acute inpatient rehab (3hours/day) (from [facility], would like to go to [facility] rehab)     Assistance Recommended at Discharge Frequent or constant Supervision/Assistance  Equipment Recommendations  Other (comment) (TBD)    Recommendations for Other Services Rehab consult     Precautions / Restrictions Precautions Precautions: Fall Precaution Comments: cortrak, SBP <160 Restrictions Weight Bearing Restrictions: No Other Position/Activity Restrictions: Wears a lift in R shoe s/p hip replacement on L causing leg length discrepency      Mobility  Bed Mobility Overal bed mobility: Needs Assistance Bed Mobility: Supine to Sit     Supine to sit: Mod assist;+2 for physical assistance;+2 for safety/equipment Sit to supine: Mod assist;+2 for physical assistance   General bed mobility comments: once mvmt towards EOB initiated, pt participated with elevation of trunk into sitting, immediate L lean once upright but pt corrected with light tactile cues.    Transfers Overall transfer level: Needs assistance Equipment used: 2 person hand held assist Transfers: Sit to/from Stand Sit to Stand: Mod assist;+2 physical assistance           General transfer comment: Pt initiated standing from EOB, mod A +2 to control mvmt and prevent knee buckling. Pt fatigued very quickly in standing and requested to sit after 10 secs. Able to tolerate one more bout of standing before returning to supine.    Ambulation/Gait               General Gait Details: did not advance ambulation today and pt needing sleep and not doing as well controlling LE motion in standing, likely due to fatigue   Stairs             Wheelchair Mobility    Modified Rankin (Stroke Patients Only) Modified Rankin (Stroke Patients Only) Pre-Morbid Rankin Score: No symptoms Modified Rankin: Severe disability     Balance Overall balance assessment: Needs assistance Sitting-balance support: No upper extremity supported;Feet supported Sitting balance-Leahy Scale: Poor Sitting balance - Comments: L lateral lean. MinA throughout for sitting balance   Standing balance support: Bilateral upper extremity supported;During functional activity Standing balance-Leahy Scale: Poor Standing balance comment: reliant  on UE support and external assist                            Cognition Arousal/Alertness: Awake/alert Behavior During Therapy: Restless Overall Cognitive Status: Impaired/Different from baseline Area of Impairment: Following  commands;Safety/judgement;Awareness;Problem solving;Attention                   Current Attention Level: Sustained   Following Commands: Follows one step commands with increased time Safety/Judgement: Decreased awareness of safety;Decreased awareness of deficits Awareness: Intellectual Problem Solving: Slow processing;Requires verbal cues;Difficulty sequencing General Comments: pt writhing in bed and appears uncomfortable. Deep pressure given to UE's and pt visibly responded with relaxation and slowed breathing. Blankets placed on patient after session to further help pt calm and try to get some rest        Exercises      General Comments General comments (skin integrity, edema, etc.): VSS on 5 L O2. Wife present and discussed tips for helping him rest      Pertinent Vitals/Pain Pain Assessment: Faces Faces Pain Scale: Hurts a little bit Pain Location: R knee with flexion and deep pressure Pain Descriptors / Indicators: Discomfort;Grimacing;Pressure Pain Intervention(s): Limited activity within patient's tolerance;Monitored during session    Home Living                          Prior Function            PT Goals (current goals can now be found in the care plan section) Acute Rehab PT Goals Patient Stated Goal: none stated but body language indicating he wants to move PT Goal Formulation: With patient/family Time For Goal Achievement: 12/10/21 Potential to Achieve Goals: Good Progress towards PT goals: Progressing toward goals    Frequency    Min 4X/week      PT Plan Current plan remains appropriate    Co-evaluation PT/OT/SLP Co-Evaluation/Treatment: Yes Reason for Co-Treatment: Complexity of the patient's impairments (multi-system involvement);Necessary to address cognition/behavior during functional activity;For patient/therapist safety PT goals addressed during session: Mobility/safety with mobility;Balance;Strengthening/ROM         AM-PAC PT "6 Clicks" Mobility   Outcome Measure  Help needed turning from your back to your side while in a flat bed without using bedrails?: A Little Help needed moving from lying on your back to sitting on the side of a flat bed without using bedrails?: A Lot Help needed moving to and from a bed to a chair (including a wheelchair)?: Total Help needed standing up from a chair using your arms (e.g., wheelchair or bedside chair)?: Total Help needed to walk in hospital room?: Total Help needed climbing 3-5 steps with a railing? : Total 6 Click Score: 9    End of Session Equipment Utilized During Treatment: Gait belt Activity Tolerance: Patient limited by fatigue Patient left: with call bell/phone within reach;in bed;with bed alarm set;with family/visitor present Nurse Communication: Mobility status PT Visit Diagnosis: Unsteadiness on feet (R26.81);Difficulty in walking, not elsewhere classified (R26.2);Ataxic gait (R26.0)     Time: 1041-1110 PT Time Calculation (min) (ACUTE ONLY): 29 min  Charges:  $Therapeutic Activity: 8-22 mins                     Leighton Roach, PT  Acute Rehab Services  Pager [phone] Office [place] 12/03/2021, 12:33 PM

## 2021-12-03 NOTE — Progress Notes (Signed)
Occupational Therapy Treatment Patient Details Name: [name] MRN: [id number] DOB: 1946-06-14 Today's Date: 12/03/2021   History of present illness 75 yo male presenting to ED on 12/11 with ataxia and vertigo. 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 TNK given at 1214. S/p suboccipital crani for evacuation on 12/13. PMH including CAD s/p CABG x4, HLD, OA, nephrolithiasis, urinary retention, BPH with LUTS and prostate cancer.   OT comments  Pt progressing towards established OT goals and motivated to participate in therapy despite fatigue. Upon arrival, pt restless in bed. RN and wife reporting he hasn't slept well in a few days. Providing joint compressions to wrist, elbows, and shoulder - noting visible signs of relaxation. Pt performing sit<>stand from EOB with Mod A +2. Adapting environment to optimize restfulness with music, decreased light, and heavy blankets. Providing wife with education on changes in sensory input to promote rest. Continue to highly recommend dc to AIR and will continue to follow acutely as admitted.    Recommendations for follow up therapy are one component of a multi-disciplinary discharge planning process, led by the attending physician.  Recommendations may be updated based on patient status, additional functional criteria and insurance authorization.    Follow Up Recommendations  Acute inpatient rehab (3hours/day)    Assistance Recommended at Discharge Frequent or constant Supervision/Assistance  Equipment Recommendations  BSC/3in1    Recommendations for Other Services PT consult;Rehab consult;Speech consult    Precautions / Restrictions Precautions Precautions: Fall Precaution Comments: cortrak, SBP <160 Restrictions Weight Bearing Restrictions: No Other Position/Activity Restrictions: Wears a lift in R shoe s/p hip replacement on L causing leg length discrepency       Mobility Bed Mobility Overal bed  mobility: Needs Assistance Bed Mobility: Supine to Sit     Supine to sit: Mod assist;+2 for physical assistance;+2 for safety/equipment Sit to supine: Mod assist;+2 for physical assistance   General bed mobility comments: once mvmt towards EOB initiated, pt participated with elevation of trunk into sitting, immediate L lean once upright but pt corrected with light tactile cues.    Transfers Overall transfer level: Needs assistance Equipment used: 2 person hand held assist Transfers: Sit to/from Stand Sit to Stand: Mod assist;+2 physical assistance           General transfer comment: Pt initiated standing from EOB, mod A +2 to control mvmt and prevent knee buckling. Pt fatigued very quickly in standing and requested to sit after 10 secs. Able to tolerate one more bout of standing before returning to supine.     Balance Overall balance assessment: Needs assistance Sitting-balance support: No upper extremity supported;Feet supported Sitting balance-Leahy Scale: Poor Sitting balance - Comments: L lateral lean. MinA throughout for sitting balance   Standing balance support: Bilateral upper extremity supported;During functional activity Standing balance-Leahy Scale: Poor Standing balance comment: reliant on UE support and external assist                           ADL either performed or assessed with clinical judgement   ADL Overall ADL's : Needs assistance/impaired                                     Functional mobility during ADLs: Moderate assistance;+2 for physical assistance (sit<>stand only) General ADL Comments: Pt restless while in bed. Providing proprioceptive input at BUE and BLEs.  Pt also performing sit<>stand x2 while at EOB. More restful after session. Provided increased deep pressure with heavy blankets once in bed.    Extremity/Trunk Assessment Upper Extremity Assessment Upper Extremity Assessment: RUE deficits/detail;LUE  deficits/detail RUE Deficits / Details: Decreased coordination and active use of R UE.  Weakness. LUE Deficits / Details: Decreased coordination. Difficulty performing finger to nose test.   Lower Extremity Assessment Lower Extremity Assessment: Defer to PT evaluation        Vision       Perception     Praxis      Cognition Arousal/Alertness: Awake/alert Behavior During Therapy: Restless Overall Cognitive Status: Impaired/Different from baseline Area of Impairment: Following commands;Safety/judgement;Awareness;Problem solving;Attention                   Current Attention Level: Sustained   Following Commands: Follows one step commands with increased time Safety/Judgement: Decreased awareness of safety;Decreased awareness of deficits Awareness: Intellectual Problem Solving: Slow processing;Requires verbal cues;Difficulty sequencing General Comments: pt restless in bed and appears uncomfortable. Deep pressure given to UE's and pt visibly responded with relaxation and slowed breathing. Blankets placed on patient after session to further help pt calm and try to get some rest          Exercises Exercises: Other exercises Other Exercises Other Exercises: Joint compressions at wrists, elbow, and shoulders; x10 each. Other Exercises: Joint compressions to ankles and knees x5.   Shoulder Instructions       General Comments VSS on 5 L O2. Wife present and discussed tips and environmental adaptations for helping him rest.    Pertinent Vitals/ Pain       Pain Assessment: Faces Faces Pain Scale: Hurts a little bit Pain Location: R knee with flexion and deep pressure Pain Descriptors / Indicators: Discomfort;Grimacing;Pressure Pain Intervention(s): Monitored during session;Limited activity within patient's tolerance;Repositioned  Home Living                                          Prior Functioning/Environment              Frequency  Min  2X/week        Progress Toward Goals  OT Goals(current goals can now be found in the care plan section)  Progress towards OT goals: Progressing toward goals  Acute Rehab OT Goals OT Goal Formulation: With family Time For Goal Achievement: 12/10/21 Potential to Achieve Goals: Good ADL Goals Pt Will Perform Grooming: with set-up;with supervision;sitting Pt Will Perform Lower Body Dressing: with min assist;sit to/from stand Pt Will Transfer to Toilet: with min assist;stand pivot transfer;bedside commode Pt Will Perform Toileting - Clothing Manipulation and hygiene: with min guard assist;sitting/lateral leans  Plan Discharge plan remains appropriate    Co-evaluation    PT/OT/SLP Co-Evaluation/Treatment: Yes Reason for Co-Treatment: To address functional/ADL transfers;For patient/therapist safety;Complexity of the patient's impairments (multi-system involvement) PT goals addressed during session: Mobility/safety with mobility;Balance;Strengthening/ROM OT goals addressed during session: ADL's and self-care      AM-PAC OT "6 Clicks" Daily Activity     Outcome Measure   Help from another person eating meals?: A Lot Help from another person taking care of personal grooming?: A Lot Help from another person toileting, which includes using toliet, bedpan, or urinal?: A Lot Help from another person bathing (including washing, rinsing, drying)?: A Lot Help from another person to put on and taking off regular upper  body clothing?: A Lot Help from another person to put on and taking off regular lower body clothing?: A Lot 6 Click Score: 12    End of Session Equipment Utilized During Treatment: Gait belt  OT Visit Diagnosis: Unsteadiness on feet (R26.81);Other abnormalities of gait and mobility (R26.89);Muscle weakness (generalized) (M62.81);History of falling (Z91.81)   Activity Tolerance Patient tolerated treatment well   Patient Left in bed;with call bell/phone within reach;with  family/visitor present   Nurse Communication Mobility status        Time: 2341-4436 OT Time Calculation (min): 39 min  Charges: OT General Charges $OT Visit: 1 Visit OT Treatments $Therapeutic Activity: 23-37 mins  [place], OTR/L Acute Rehab Pager: [phone] Office: [place] 12/03/2021, 1:41 PM

## 2021-12-03 NOTE — TOC Progression Note (Signed)
Transition of Care [facility]) - Progression Note    Patient Details  Name: [name] MRN: [id number] Date of Birth: 1946/06/20  [facility] CM/SW Contact  Ella Bodo, RN Phone Number: 12/03/2021, 4:28 PM  Clinical Narrative:    Patient now off Precedex drip with plans to transfer out of ICU soon.  Updated progress notes and therapy notes faxed to [facility] in admissions at [facility] rehab; left message for [place] on [employer].  Spoke with patient's wife, [name], and updated her.  She is appreciative of my call; will provide updates as they are available.   Expected Discharge Plan: IP Rehab Facility Barriers to Discharge: Continued Medical Work up  Expected Discharge Plan and Services Expected Discharge Plan: [place]   Discharge Planning Services: CM Consult Post Acute Care Choice: IP Rehab Living arrangements for the past 2 months: Single Family Home                                       Social Determinants of Health (SDOH) Interventions    Readmission Risk Interventions No flowsheet data found.  Reinaldo Raddle, RN, BSN  Trauma/Neuro ICU Case Manager [phone]

## 2021-12-04 ENCOUNTER — Inpatient Hospital Stay (HOSPITAL_COMMUNITY): Payer: Medicare Other

## 2021-12-04 DIAGNOSIS — J9602 Acute respiratory failure with hypercapnia: Secondary | ICD-10-CM

## 2021-12-04 LAB — HEPATIC FUNCTION PANEL
ALT: 118 U/L — ABNORMAL HIGH (ref 0–44)
AST: 124 U/L — ABNORMAL HIGH (ref 15–41)
Albumin: 2.3 g/dL — ABNORMAL LOW (ref 3.5–5.0)
Alkaline Phosphatase: 65 U/L (ref 38–126)
Bilirubin, Direct: <0.1 mg/dL (ref 0.0–0.2)
Total Bilirubin: 0.6 mg/dL (ref 0.3–1.2)
Total Protein: 6 g/dL — ABNORMAL LOW (ref 6.5–8.1)

## 2021-12-04 LAB — CULTURE, BLOOD (ROUTINE X 2)
Culture: NO GROWTH
Culture: NO GROWTH
Special Requests: ADEQUATE
Special Requests: ADEQUATE

## 2021-12-04 LAB — CBC WITH DIFFERENTIAL/PLATELET
Abs Immature Granulocytes: 0.04 10*3/uL (ref 0.00–0.07)
Basophils Absolute: 0 10*3/uL (ref 0.0–0.1)
Basophils Relative: 0 %
Eosinophils Absolute: 0.1 10*3/uL (ref 0.0–0.5)
Eosinophils Relative: 2 %
HCT: 46.5 % (ref 39.0–52.0)
Hemoglobin: 15 g/dL (ref 13.0–17.0)
Immature Granulocytes: 1 %
Lymphocytes Relative: 13 %
Lymphs Abs: 1 10*3/uL (ref 0.7–4.0)
MCH: 30.4 pg (ref 26.0–34.0)
MCHC: 32.3 g/dL (ref 30.0–36.0)
MCV: 94.1 fL (ref 80.0–100.0)
Monocytes Absolute: 0.7 10*3/uL (ref 0.1–1.0)
Monocytes Relative: 9 %
Neutro Abs: 5.4 10*3/uL (ref 1.7–7.7)
Neutrophils Relative %: 75 %
Platelets: 207 10*3/uL (ref 150–400)
RBC: 4.94 MIL/uL (ref 4.22–5.81)
RDW: 16.2 % — ABNORMAL HIGH (ref 11.5–15.5)
WBC: 7.2 10*3/uL (ref 4.0–10.5)
nRBC: 0 % (ref 0.0–0.2)

## 2021-12-04 LAB — BASIC METABOLIC PANEL
Anion gap: 7 (ref 5–15)
BUN: 30 mg/dL — ABNORMAL HIGH (ref 8–23)
CO2: 23 mmol/L (ref 22–32)
Calcium: 7.9 mg/dL — ABNORMAL LOW (ref 8.9–10.3)
Chloride: 130 mmol/L — ABNORMAL HIGH (ref 98–111)
Creatinine, Ser: 1.29 mg/dL — ABNORMAL HIGH (ref 0.61–1.24)
GFR, Estimated: 58 mL/min — ABNORMAL LOW (ref >60)
Glucose, Bld: 117 mg/dL — ABNORMAL HIGH (ref 70–99)
Potassium: 3.3 mmol/L — ABNORMAL LOW (ref 3.5–5.1)
Sodium: 160 mmol/L — ABNORMAL HIGH (ref 135–145)

## 2021-12-04 LAB — MAGNESIUM: Magnesium: 2.8 mg/dL — ABNORMAL HIGH (ref 1.7–2.4)

## 2021-12-04 LAB — GLUCOSE, CAPILLARY
Glucose-Capillary: 114 mg/dL — ABNORMAL HIGH (ref 70–99)
Glucose-Capillary: 115 mg/dL — ABNORMAL HIGH (ref 70–99)
Glucose-Capillary: 67 mg/dL — ABNORMAL LOW (ref 70–99)
Glucose-Capillary: 73 mg/dL (ref 70–99)
Glucose-Capillary: 84 mg/dL (ref 70–99)
Glucose-Capillary: 88 mg/dL (ref 70–99)

## 2021-12-04 LAB — CK: Total CK: 2469 U/L — ABNORMAL HIGH (ref 49–397)

## 2021-12-04 LAB — CBC
HCT: 46.4 % (ref 39.0–52.0)
Hemoglobin: 15.2 g/dL (ref 13.0–17.0)
MCH: 30.7 pg (ref 26.0–34.0)
MCHC: 32.8 g/dL (ref 30.0–36.0)
MCV: 93.7 fL (ref 80.0–100.0)
Platelets: 207 10*3/uL (ref 150–400)
RBC: 4.95 MIL/uL (ref 4.22–5.81)
RDW: 16.2 % — ABNORMAL HIGH (ref 11.5–15.5)
WBC: 7.1 10*3/uL (ref 4.0–10.5)
nRBC: 0 % (ref 0.0–0.2)

## 2021-12-04 LAB — PHOSPHORUS: Phosphorus: 4.1 mg/dL (ref 2.5–4.6)

## 2021-12-04 IMAGING — DX DG CHEST 1V PORT
1 series · 1 of 1 positions shown · non-contrast
Comparison: Chest x-ray [DATE].

CLINICAL DATA: Code stroke.  Shortness of breath.

EXAM:
PORTABLE CHEST 1 VIEW

[chest ap]
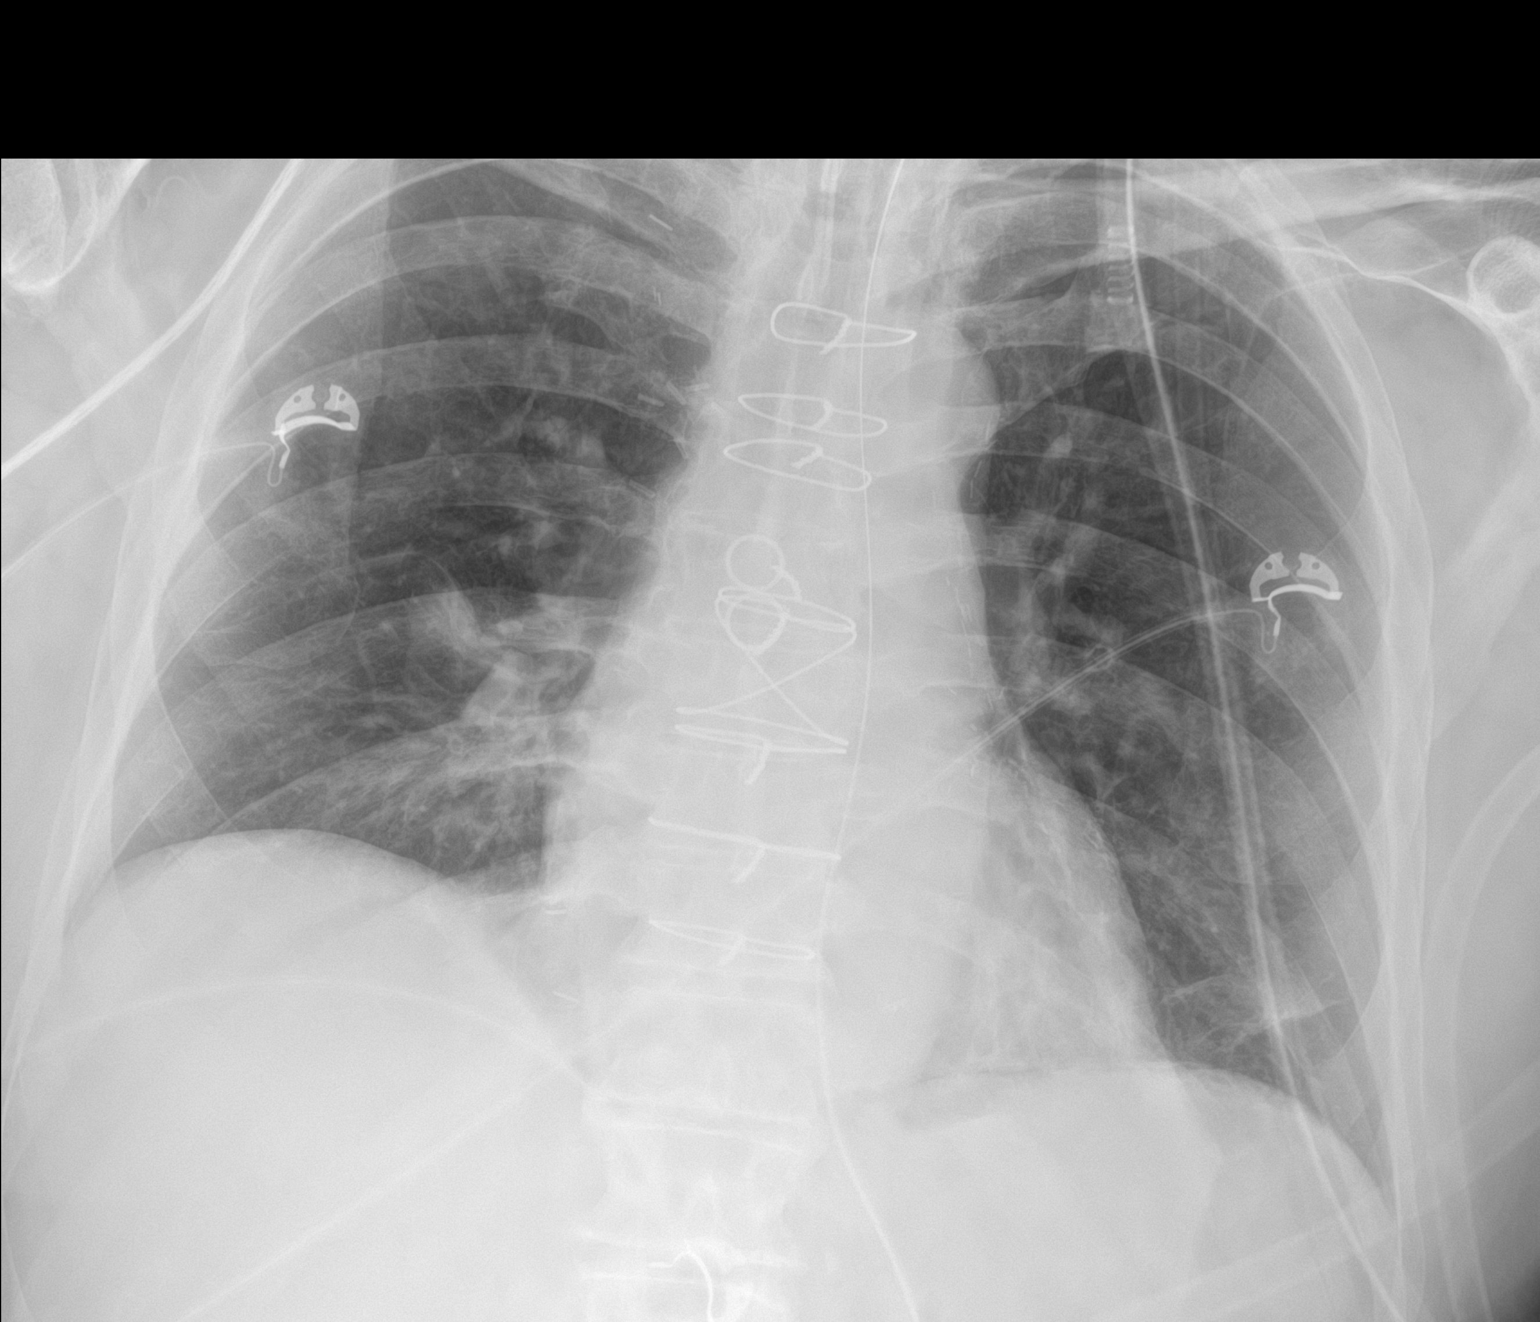

[1 of 1 positions shown; findings below may reference images not displayed]

FINDINGS: Endotracheal tube tip is 5.7 cm above the carina. Patient is status
post cardiac surgery. Enteric tube extends below the diaphragm,
distal tip is not included on the image.

There is some minimal patchy infrahilar opacities bilaterally.
Costophrenic angles are clear. There is no pneumothorax. No acute
fractures are seen.
IMPRESSION: 1. Patchy bilateral infrahilar opacities may represent aspiration or
infection.

## 2021-12-04 MED ORDER — FENTANYL CITRATE PF 50 MCG/ML IJ SOSY: 100.0000 ug | PREFILLED_SYRINGE | Freq: Once | INTRAMUSCULAR | Status: AC

## 2021-12-04 MED ORDER — FREE WATER
200.0000 mL | Status: DC
Start: 2021-12-04 — End: 2021-12-07
  Administered 2021-12-04 – 2021-12-07 (×13): 200 mL

## 2021-12-04 MED ORDER — FENTANYL CITRATE PF 50 MCG/ML IJ SOSY
PREFILLED_SYRINGE | INTRAMUSCULAR | Status: AC
  Administered 2021-12-04: 23:00:00 100 ug via INTRAVENOUS
  Filled 2021-12-04: qty 2

## 2021-12-04 MED ORDER — SODIUM CHLORIDE 0.9 % IV SOLN
2.0000 g | INTRAVENOUS | Status: AC
  Administered 2021-12-04 – 2021-12-10 (×7): 2 g via INTRAVENOUS
  Filled 2021-12-04 (×7): qty 20

## 2021-12-04 MED ORDER — VANCOMYCIN HCL 1250 MG/250ML IV SOLN
1250.0000 mg | INTRAVENOUS | Status: DC
  Administered 2021-12-05: 01:00:00 1250 mg via INTRAVENOUS
  Filled 2021-12-04: qty 250

## 2021-12-04 MED ORDER — POTASSIUM CHLORIDE 20 MEQ PO PACK
60.0000 meq | PACK | Freq: Once | ORAL | Status: AC
  Administered 2021-12-04: 10:00:00 60 meq
  Filled 2021-12-04: qty 3

## 2021-12-04 MED ORDER — ETOMIDATE 2 MG/ML IV SOLN
INTRAVENOUS | Status: AC
  Administered 2021-12-04: 23:00:00 20 mg via INTRAVENOUS
  Filled 2021-12-04: qty 20

## 2021-12-04 MED ORDER — ETOMIDATE 2 MG/ML IV SOLN: 20.0000 mg | Freq: Once | INTRAVENOUS | Status: AC

## 2021-12-04 MED ORDER — NOREPINEPHRINE 4 MG/250ML-% IV SOLN
INTRAVENOUS | Status: AC
  Filled 2021-12-04: qty 250

## 2021-12-04 MED ORDER — DEXTROSE 50 % IV SOLN
INTRAVENOUS | Status: AC
  Administered 2021-12-04: 50 mL
  Filled 2021-12-04: qty 50

## 2021-12-04 MED ORDER — VALPROIC ACID 250 MG/5ML PO SOLN
500.0000 mg | Freq: Three times a day (TID) | ORAL | Status: AC
  Administered 2021-12-04 (×3): 500 mg via ORAL
  Filled 2021-12-04 (×3): qty 10

## 2021-12-04 MED ORDER — HALOPERIDOL LACTATE 5 MG/ML IJ SOLN
5.0000 mg | Freq: Once | INTRAMUSCULAR | Status: AC
  Administered 2021-12-04: 16:00:00 5 mg via INTRAVENOUS
  Filled 2021-12-04: qty 1

## 2021-12-04 MED ORDER — FENTANYL 2500MCG IN NS 250ML (10MCG/ML) PREMIX INFUSION
0.0000 ug/h | INTRAVENOUS | Status: DC
  Administered 2021-12-04: 50 ug/h via INTRAVENOUS
  Filled 2021-12-04: qty 250

## 2021-12-04 MED ORDER — MIDAZOLAM HCL 2 MG/2ML IJ SOLN: 2.0000 mg | Freq: Once | INTRAMUSCULAR | Status: AC

## 2021-12-04 MED ORDER — JEVITY 1.5 CAL/FIBER PO LIQD
1000.0000 mL | ORAL | Status: DC
  Administered 2021-12-05 – 2021-12-24 (×18): 1000 mL
  Filled 2021-12-04 (×30): qty 1000

## 2021-12-04 MED ORDER — MIDAZOLAM HCL 2 MG/2ML IJ SOLN
INTRAMUSCULAR | Status: AC
  Administered 2021-12-04: 23:00:00 2 mg via INTRAVENOUS
  Filled 2021-12-04: qty 2

## 2021-12-04 MED ORDER — VECURONIUM BROMIDE 10 MG IV SOLR
INTRAVENOUS | Status: AC
  Filled 2021-12-04: qty 10

## 2021-12-04 MED ORDER — PROPOFOL 1000 MG/100ML IV EMUL
5.0000 ug/kg/min | INTRAVENOUS | Status: DC
  Administered 2021-12-05: 08:00:00 10 ug/kg/min via INTRAVENOUS
  Administered 2021-12-05 (×2): 20 ug/kg/min via INTRAVENOUS
  Administered 2021-12-06: 03:00:00 25 ug/kg/min via INTRAVENOUS
  Filled 2021-12-04 (×4): qty 100

## 2021-12-04 MED ORDER — NOREPINEPHRINE 4 MG/250ML-% IV SOLN
0.0000 ug/min | INTRAVENOUS | Status: DC
  Administered 2021-12-04: 23:00:00 10 ug/min via INTRAVENOUS
  Administered 2021-12-05: 08:00:00 4 ug/min via INTRAVENOUS
  Filled 2021-12-04 (×2): qty 250

## 2021-12-04 MED ORDER — SODIUM CHLORIDE 0.9 % IV SOLN: Freq: Once | INTRAVENOUS | Status: AC

## 2021-12-04 NOTE — Procedures (Signed)
Patient with severe altered mental status respiratory distress not able to protect airways status post stroke and occipital decompressive craniotomy we will go ahead and intubate the patient

## 2021-12-04 NOTE — Progress Notes (Signed)
PT Cancellation Note  Patient Details Name: Rasul Decola MRN: [id number] DOB: 02-13-1946   Cancelled Treatment:    Reason Eval/Treat Not Completed: Patient's level of consciousness - PT arrived to room, pt attempting to sleep and per wife pt has been very restless all morning. PT to check back as time allows.  Stacie Glaze, PT DPT Acute Rehabilitation Services Pager [phone]  Office [phone]    Louis Matte 12/04/2021, 10:45 AM

## 2021-12-04 NOTE — Procedures (Signed)
Name: Chananya Canizalez MRN: [id number] DOB: March 15, 1946   PROCEDURE NOTE  Procedure:  Endotracheal intubation.  Indication:  Acute respiratory failure  Consent:  Consent was implied due to the emergency nature of the procedure.  Anesthesia:  A total of 10 mg of Etomidate was given intravenously.  Procedure summary:  Appropriate equipment was assembled. The patient was identified as [employer] and safety timeout was performed. The patient was placed supine, with head in sniffing position. After adequate level of anesthesia was achieved, a malar 4 blade was inserted into the oropharynx and the vocal cords were visualized. A 8 endotracheal tube was inserted without difficulty and visualized going through the vocal cords. The stylette was removed and cuff inflated. Colorimetric change was noted on the CO2 meter. Breath sounds were heard over both lung fields equally. ETT was secured at 25 lip line.  Post procedure chest xray was ordered.  Complications:  No immediate complications were noted.  Hemodynamic parameters and oxygenation remained stable throughout the procedure.      12/04/2021, 10:55 PM

## 2021-12-04 NOTE — Progress Notes (Signed)
CorTrak removed by patient. Notified SLP; SLP advised meds crushed in puree until new CorTrak can be placed tomorrow.

## 2021-12-04 NOTE — Progress Notes (Signed)
SLP Cancellation Note  Patient Details Name: [name] MRN: [id number] DOB: 28-Mar-1946   Cancelled treatment:       Reason Eval/Treat Not Completed: Other (comment). Pt restless and need of cleaning up. Attempted swallow therapy, but will try alter if schedule allows if pt more able to participate   Rayette Mogg, Katherene Ponto 12/04/2021, 11:14 AM

## 2021-12-04 NOTE — Progress Notes (Deleted)
Modified Barium Swallow Progress Note  Patient Details  Name: Latravious Levitt MRN: [id number] Date of Birth: [birth year]  Today's Date: 12/04/2021  Modified Barium Swallow completed.  Full report located under Chart Review in the Imaging Section.  Brief recommendations include the following:  Clinical Impression      Swallow Evaluation Recommendations                       Compensations: Slow rate;Small sips/bites                Kiegan Macaraeg, Katherene Ponto 12/04/2021,2:22 PM

## 2021-12-04 NOTE — Progress Notes (Signed)
STROKE TEAM PROGRESS NOTE   INTERVAL HISTORY Patient is seen in his room with his wife at the bedside.  Patient appears more confused and delirious today.  Remains intermittently agitated.  He has pulled out core track tube in future if he still recommends n.p.o. with only nectar thick liquid sips only he was febrile last night and has just completed course of Unasyn for aspiration pneumonia.  WBC count was normal Vitals:   12/04/21 1300 12/04/21 1400 12/04/21 1500 12/04/21 1600  BP: (!) 108/49 113/60 (!) 165/86   Pulse: (!) 51 (!) 50 (!) 49 (!) 51  Resp: (!) 39 (!) 23 19 20   Temp:      TempSrc:      SpO2: 92% 93% 95% 97%  Weight:      Height:       CBC:  Recent Labs  Lab 12/04/21 0523 12/04/21 0526  WBC 7.1 7.2  NEUTROABS  --  5.4  HGB 15.2 15.0  HCT 46.4 46.5  MCV 93.7 94.1  PLT 207 237   Basic Metabolic Panel:  Recent Labs  Lab 12/03/21 0927 12/04/21 0523  NA 159* 160*  K 3.5 3.3*  CL 126* 130*  CO2 23 23  GLUCOSE 116* 117*  BUN 19 30*  CREATININE 1.22 1.29*  CALCIUM 8.0* 7.9*  MG 2.5* 2.8*  PHOS 2.8 4.1   Lipid Panel:  No results for input(s): CHOL, TRIG, HDL, CHOLHDL, VLDL, LDLCALC in the last 168 hours.  HgbA1c:  No results for input(s): HGBA1C in the last 168 hours.  Urine Drug Screen:  No results for input(s): LABOPIA, COCAINSCRNUR, LABBENZ, AMPHETMU, THCU, LABBARB in the last 168 hours.   Alcohol Level No results for input(s): ETH in the last 168 hours.  IMAGING past 24 hours No results found.  PHYSICAL EXAM  Temp:  [97.9 F (36.6 C)-103.2 F (39.6 C)] 101.1 F (38.4 C) (12/20 1200) Pulse Rate:  [49-72] 51 (12/20 1600) Resp:  [16-39] 20 (12/20 1600) BP: (104-166)/(41-137) 165/86 (12/20 1500) SpO2:  [80 %-99 %] 97 % (12/20 1600)  General - Well nourished, well developed elderly Caucasian male, in no apparent distress. Cardiovascular - Regular rhythm and rate. Respiratory- respirations even and unlabored  NEURO:  Mental Status:  AA&Ox3 Speech/Language: Awake alert but disoriented.  Poor attention easy distractibility able to speak short phrases, mild nonfluent speech does have some difficulty finding the correct word and some confusion.  Disoriented.  Easy distractibility.  Diminished attention, registration and recall. Cranial Nerves II - XII - II -no visual field deficit III, IV, VI -full range but had saccadic dysmetria on lateral gaze to the right V - Facial sensation intact bilaterally. VII - Facial movement intact bilaterally. VIII - Hearing & vestibular intact bilaterally. X - Palate elevates symmetrically. XI - Chin turning & shoulder shrug intact bilaterally. XII - Tongue protrusion intact.   Motor Strength -right upper extremity and right lower extremity slightly weaker than left. Motor Tone - Muscle tone was assessed at the neck and appendages and was normal. Reflexes - The patients reflexes were symmetrical in all extremities and he had no pathological reflexes. Sensory - Light touch, temperature/pinprick were assessed and were symmetrical.   Coordination -difficulty with finger-to-nose and medial coordination on the right gait and Station - deferred.   ASSESSMENT/PLAN [name] is a 75 y.o. male with history of CABG x4, hyperlipidemia, prostate cancer, internal hemorrhoid, and a nonobstructive calculus in right interpole with hematuria presenting with acute onset vertigo nausea vomiting  and an inability to walk.  At 10 AM on November 25, 2021 patient developed vertigo nausea and vomiting.  He also noted abdominal discomfort.  A code stroke was called on his arrival to triage.  Once the patient was in the CT scanner he developed more nausea and vomiting he went to a stat MRI which was positive for bilateral cerebellar strokes.  He then developed ataxia in his bilateral upper and lower extremities.  He was then given TNKase and went for a stat CTA of the head.  No LVO was shown on the CTA. Repeat MRI  showed progression of edema related to the cerebellar infarcts with mass effect on the 4th ventricle. Neurosurgery consulted and hypertonic saline initiated. 12/13 suboccipital craniectomy overnight.  Patient tolerated procedure well.  Speech therapy recommends resuming nectar thickened liquids with his diet.  Repeat CT 12/14 shows hemorrhagic transformation of patient's stroke.  Lovenox resumed today as he is post 24 hours from hemorrhagic transformation of his stroke.  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 ASPECTS 10.    Repeat CT-extensive swelling in the areas of the acute cerebellar infarction more extensive on the right than the left.  Mass-effect on the fourth ventricle.  No evidence of ventricular obstruction.  Stable lateral and third ventricles. Post craniectomy CT-Unchanged right greater than left cerebellar infarcts.  No new infarction.  Unchanged narrowing of the fourth ventricle.  No evidence of ventricular obstruction or hydrocephalus. Repeat CT 12/14 New hyperdensity in right cerebellar infarct, likely hemorrhage with no increased mass effect. CTA head & neck unremarkable MRI  acute infarct of the cerebellum bilaterally.  negative for hemorrhage.  MRI Repeat 12/12- Interval progression of edema associated to bilateral cerebellar acute infarcts with mass effect on the fourth ventricle and low lying right cerebellar tonsil. No hydrocephalus CT head 12/14 -right cerebellum hemorrhagic transformation without hydrocephalus CT repeat pending 2D Echo EF 70-75% LDL 106 HgbA1c 5.8 VTE prophylaxis - SCDs aspirin 81 mg daily prior to admission, now on aspirin 81, lovenox and plavix discontinued due to hemorrhagic transformation of cerebellar stroke. Will consider DOAC in 14 days post stroke if neuro and CT stable Therapy recommendations: Inpatient  rehab- [facility] rehab closer to patients home Disposition: Pending  Cerebellar Edema s/p suboccipital decompression  3% normal saline initiated on 12/12 Sodium goal is 150-155 Na 154 S/p suboccipital decompression with Dr. Zada Finders 3% d/c'd and then resumed due to sodium levels Close neuro monitoring CT 12/13 reveals unchanged infarcts without evidence of hydrocephalus CT head 12/14 - right cerebellum hemorrhagic transformation without hydrocephalus CT head 12/17- Stable cerebellar infarct with hemorrhage.  No hydrocephalus or interval change Titrate down 3% normal saline overnight.  Orders placed  Hypertension Home meds: None Stable on the high end BP <160  Continue cozaar 50 bid, cardizem 60 Q8 Add hydralazine 25 Q8 Taper off Cleviprex as able  Atrial flutter - new diagnosis Cardiology on board IV Cardizem changed to p.o. 60 mg Q8 As needed metoprolol for rate control Consider DOAC in 14 days post stroke if neuro and CT stable  Fever Leukocytosis T-max 101.5->101.3-> 99.3->101.9 WBC 13.3->13.6->10.0->9.9 CXR 12/13 - Left lower lobe consolidation with small adjacent left pleural effusion. Findings are likely either due to pneumonia or aspiration. On unasyn   Hyperlipidemia Home meds: Atorvastatin 80 mg, resumed in hospital LDL 106, goal < 70 Atorvastatin 80 mg daily Continue statin at discharge  Delirium/agitation Encephalopathy Sundowning On precedex, taper off as able On seroquel 25 Qhs-> increase to 50 tonight Could be due to  . Fever/aspiration pneumonia Repeat CT 12/14 reveals no hydrocephalus Emphasize sleep hygiene Added valproic acid 500 mg 3 times daily  Dysphagia NPO for now MBSS done 12/16 [place] ordered. Patient is only able to have sparing thickened liquids currently Will start TF and meds via cortrak  Other Stroke Risk Factors Advanced Age >/= 31  Obesity, Body mass index is 31.03 kg/m., BMI >/= 30 associated with increased.  Use of oral blood  pressure medications.  Risk, recommend weight loss, diet and exercise as appropriate  Coronary artery disease s/p CABG  Other Active Problems Abdominal discomfort CTA abdomen pelvis-no evidence of active GI bleed.  Moderate aortic atherosclerosis.  Diverticulosis with no evidence of diverticulitis. Internal hemorrhoid Nonobstructive calculus in right interpole Hematuria one month ago Flomax DC'd and changed to Urecholine 10 mg 3 times daily   Hospital day # 9  Patient remains confused and intermittently slightly agitated despite getting Seroquel .  Plan to add valproic acid 500 mg 3 times daily to help with agitation.Marland Kitchen  Hopefully transfer out of ICU in the next couple of days and then to rehab.  Long discussion with patient's wife  at the bedside.  They prefer inpatient rehab [facility] area where they live.  Discussed with Montey Hora critical care medicine PA.This patient is critically ill and at significant risk of neurological worsening, death and care requires constant monitoring of vital signs, hemodynamics,respiratory and cardiac monitoring, extensive review of multiple databases, frequent neurological assessment, discussion with family, other specialists and medical decision making of high complexity.I have made any additions or clarifications directly to the above note.This critical care time does not reflect procedure time, or teaching time or supervisory time of PA/NP/Med Resident etc but could involve care discussion time.  I spent 30 minutes of neurocritical care time  in the care of  this patient.      Antony Contras, MD Medical Director [place] Pager: [phone] 12/04/2021 4:14 PM    To contact Stroke Continuity provider, please refer to [email or web address] After hours, contact General Neurology

## 2021-12-04 NOTE — Progress Notes (Signed)
[facility] Progress Note Patient Name: [name] DOB: [birth year] MRN: [id number]   Date of Service  12/04/2021  HPI/Events of Note  Multiple watery stools - Nursing request for Flexiseal.   eICU Interventions  Plan: Place Flexiseal.      Intervention Category Major Interventions: Other:  Lysle Dingwall 12/04/2021, 3:39 AM

## 2021-12-04 NOTE — Progress Notes (Signed)
Pharmacy Antibiotic Note  [name] is a 75 y.o. male admitted on 11/25/2021 with pneumonia.  Pharmacy has been consulted for Vancomycin dosing. Pt admitted last week with stroke. Unable to protect airway 12/20 PM requiring intubation. Re-starting anti-biotics. Labs below.  Plan: Vancomycin 1250 mg IV q24h >>>Estimated AUC: 538 Ceftriaxone per MD Trend WBC, temp, renal function  F/U infectious work-up Drug levels as indicated   Height: 5\' 10"  (177.8 cm) Weight: 98.1 kg (216 lb 4.3 oz) IBW/kg (Calculated) : 73  Temp (24hrs), Avg:100.1 F (37.8 C), Min:97.9 F (36.6 C), Max:103 F (39.4 C)  Recent Labs  Lab 11/29/21 0617 11/30/21 0409 12/01/21 0329 12/03/21 0927 12/04/21 0523 12/04/21 0526  WBC 10.0 9.9  --  8.1 7.1 7.2  CREATININE 1.08 0.96 1.09 1.22 1.29*  --     Estimated Creatinine Clearance: 59 mL/min (A) (by C-G formula based on SCr of 1.29 mg/dL (H)).    Not on [place], PharmD, [place] Pharmacist Phone: [phone]

## 2021-12-04 NOTE — Progress Notes (Signed)
Increasingly agitated; patient removed Rt safety mitt. When attempting to put safety mitt back on, patient became uncooperative and began swinging arms and kicking legs. Multiple attempts at therapeutic communication to verbally de-escalate the situation failed. Continued to swing and kick violently. Notified CCM MD Meier and order for 5 mg of haldol obtained.

## 2021-12-04 NOTE — TOC Progression Note (Signed)
Transition of Care [facility]) - Progression Note    Patient Details  Name: [name] MRN: [id number] Date of Birth: Nov 02, 1946  [facility] CM/SW Contact  Oren Section Cleta Alberts, RN Phone Number: 12/04/2021, 11:50am  Clinical Narrative:    [name] with Claiborne Billings in admissions at [facility] rehab; she has reviewed patient's recent clinical information.  She states patient cannot discharge to [facility] rehab with Cortrak and will need permanent means of feeding prior to discharge to rehab center.  Speech therapy continues to follow.  Will update [facility] rehab admissions on Thursday.   Expected Discharge Plan: IP Rehab Facility Barriers to Discharge: Continued Medical Work up  Expected Discharge Plan and Services Expected Discharge Plan: [place]   Discharge Planning Services: CM Consult Post Acute Care Choice: IP Rehab Living arrangements for the past 2 months: Single Family Home                                       Social Determinants of Health (SDOH) Interventions    Readmission Risk Interventions No flowsheet data found.  Reinaldo Raddle, RN, BSN  Trauma/Neuro ICU Case Manager [phone]

## 2021-12-04 NOTE — Progress Notes (Signed)
° °  NAME:  [name], MRN:  [id number], DOB:  Feb 18, 1946, LOS: 9 ADMISSION DATE:  11/25/2021, CONSULTATION DATE:  12/13 REFERRING MD:  Marcelle Overlie, CHIEF COMPLAINT:  Stroke symptoms   History of Present Illness:  75 y/o male admitted with a cerebellar stroke on 12/11, received TNKase, he developed increasing mass effect on the 4th ventricle, moved to the ICU. 12/13 he had a suboccipital craniectomy.  Repeat CT 12/14 showed hemorrhagic conversion.  Pertinent  Medical History  CAD, s/p CABG  HLD  OA,  Nephrolithiasis BPH  Prostate cancer   Interim History / Subjective:  Spiked fever to 103F overnight. Otherwise no acute events. WBC normal.  Differential added on this AM.  Objective   Blood pressure (!) 149/137, pulse 61, temperature 99 F (37.2 C), temperature source Axillary, resp. rate (!) 22, height 5\' 10"  (1.778 m), weight 98.1 kg, SpO2 92 %.        Intake/Output Summary (Last 24 hours) at 12/04/2021 0747 Last data filed at 12/04/2021 0600 Gross per 24 hour  Intake 1718.56 ml  Output 2600 ml  Net -881.44 ml    Filed Weights   11/25/21 1200  Weight: 98.1 kg    Examination: General: Adult male, resting in bed, wife at bedside HEENT: [place]/AT, eyes anicteric.  Dry mucus membranes Neuro: Awake but confused, dysarthric and dysmetric.  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  WBC normal, vitals stable. Get blood cultures, UA, add differential onto CBC. Hold further abx for now but low threshold to add back (? Aspiration).  Chronic HFpEF Hypertension, uncontrolled CAD Goal SBP < 140 Continue Losartan and Clonidine, Coreg, Hydralazine, Lipitor  Induced hypernatremia - hypertonic now off Hypokalemia Hypocalcemia 60 mEq K per tube Follow BMP  Hyperactive delirium Continue Seroquel 100 mg nightly and 12.5 mg during daytime  Proximal atrial flutter Continue Diltiazem  BPH Continue Flomax   Best Practice (right click and "Reselect all SmartList Selections" daily)   Diet/type: NPO tube feeds DVT prophylaxis: LMWH GI prophylaxis: PPI Lines: N/A Foley:  N/A Code Status:  full code Last date of multidisciplinary goals of care discussion [12/18: OK with CPR and short term intubation but would not want prolonged life support.  Would reconsider need for mechanical ventilation if no improvement after 5-7 days.]   Montey Hora, PA - [facility] [place] [facility] For pager details, please see AMION or use Epic chat  After 1900, please call [facility] for cross coverage needs 12/04/2021, 7:47 AM

## 2021-12-04 NOTE — Progress Notes (Signed)
Speech Language Pathology Treatment: Dysphagia  Patient Details Name: [name] MRN: [id number] DOB: 1946/08/10 Today's Date: 12/04/2021 Time: 8280-0349 SLP Time Calculation (min) (ACUTE ONLY): 30 min  Assessment / Plan / Recommendation Clinical Impression  Pt seen after pulling out Cortrak. Extensive oral care performed to facilitate swallowing. Pt speech much clearer after sips of ice and water. Pt able to swallow puree with crushed meds with some delayed coughing. Pt able to participate in orientation activity with moderate verbal cueing to stay on tasks and repeat correct answers. Will continue efforts.    HPI HPI: 75 yo male with stroke risk factors of CAD and HLD. Stat MRI showed bilateral cerebellar infarcts and pt noted to have developed ataxia. Patient was given TNK as he was still in the window. On repeat exam, prior to TNK, his NIHSS was 2 for right sided ataxia. Pt was evalauted by SLP and placed on a regualr diet and nectar thick liquids given coughing with thin. Later, pt had a neurochange and underwent a suboccipital craniotomy to relieve ventricle pressure on brain stem with subsequent hemorrhagic conversion of cerebellar stroke. Pts function has declined with increased signs of aspiration. MBS to determine ability to continue with PO intake.      SLP Plan  Continue with current plan of care      Recommendations for follow up therapy are one component of a multi-disciplinary discharge planning process, led by the attending physician.  Recommendations may be updated based on patient status, additional functional criteria and insurance authorization.    Recommendations  Diet recommendations: NPO;Nectar-thick liquid (sips of nectar thick waterk) Liquids provided via: Straw;Cup Medication Administration: Crushed with puree Supervision: Staff to assist with self feeding;Full supervision/cueing for compensatory strategies Compensations: Slow rate;Small sips/bites Postural  Changes and/or Swallow Maneuvers: Seated upright 90 degrees                Follow Up Recommendations: Acute inpatient rehab (3hours/day) Assistance recommended at discharge: Frequent or constant Supervision/Assistance SLP Visit Diagnosis: Dysarthria and anarthria (R47.1);Dysphagia, unspecified (R13.10);Aphasia (R47.01) Plan: Continue with current plan of care           Nahal Wanless, Katherene Ponto  12/04/2021, 2:23 PM

## 2021-12-04 NOTE — Progress Notes (Addendum)
Nutrition Follow-up  DOCUMENTATION CODES:   Not applicable  INTERVENTION:   Tube feeding via Cortrak tube:  D/C Osmolite 1.5   Jevity 1.5 @ 55 ml/hr (1320 ml per day) Prosource TF 90 ml BID  Provides 2140 kcal, 128 gm protein, 1003 ml free water daily  200 ml free water every 4 hours (to lower Na levels after hypertonic saline) Total free water: 2203 ml  NUTRITION DIAGNOSIS:   Inadequate oral intake related to inability to eat as evidenced by NPO status. Ongoing.   GOAL:   Patient will meet greater than or equal to 90% of their needs Met with TF at goal.   MONITOR:   Diet advancement, TF tolerance  REASON FOR ASSESSMENT:   Rounds, Consult Enteral/tube feeding initiation and management  ASSESSMENT:   Pt with PMH of CAD s/p CABG, HLD, OA, nephrolithiasis, BPH, and prostate cancer admitted with cerebellar stroke 12/11 s/p TNK.     Pt discussed during ICU rounds and with RN. Pt with fevers overnight completed Abx for RLL aspiration PNA.  Noted d/c plan for rehab possibly at [facility] rehab SLP following, not ready for repeat swallow 12/19 when spoke with SLP.   12/11 admitted 12/13 pt with worsening edema s/p emergent crani 12/16 failed MBS, only allowed sips with awake  Medications reviewed   Labs reviewed: Na 160 (currently off 3%)  K: 3.3, LFTs elevated  A1C: 5.8   Diet Order:   Diet Order             Diet NPO time specified Except for: Other (See Comments)  Diet effective now                   EDUCATION NEEDS:   Not appropriate for education at this time  Skin:  Skin Assessment: Reviewed RN Assessment (closed head incision)  Last BM:  12/20 large x 4 - rectal tube placed, BM regimen meds d/c'ed  Height:   Ht Readings from Last 1 Encounters:  11/25/21 5' 10"  (1.778 m)    Weight:   Wt Readings from Last 1 Encounters:  11/25/21 98.1 kg    BMI:  Body mass index is 31.03 kg/m.  Estimated Nutritional Needs:   Kcal:   2100-2300  Protein:  115-125 grams  Fluid:  > 2 l/day  Lockie Pares., RD, LDN, CNSC See AMiON for contact information

## 2021-12-05 ENCOUNTER — Inpatient Hospital Stay (HOSPITAL_COMMUNITY): Payer: Medicare Other

## 2021-12-05 DIAGNOSIS — J9601 Acute respiratory failure with hypoxia: Secondary | ICD-10-CM

## 2021-12-05 DIAGNOSIS — I639 Cerebral infarction, unspecified: Secondary | ICD-10-CM

## 2021-12-05 DIAGNOSIS — Z978 Presence of other specified devices: Secondary | ICD-10-CM

## 2021-12-05 LAB — POCT I-STAT 7, (LYTES, BLD GAS, ICA,H+H)
Acid-base deficit: 5 mmol/L — ABNORMAL HIGH (ref 0.0–2.0)
Bicarbonate: 20.1 mmol/L (ref 20.0–28.0)
Calcium, Ion: 1.14 mmol/L — ABNORMAL LOW (ref 1.15–1.40)
HCT: 36 % — ABNORMAL LOW (ref 39.0–52.0)
Hemoglobin: 12.2 g/dL — ABNORMAL LOW (ref 13.0–17.0)
O2 Saturation: 97 %
Patient temperature: 98.6
Potassium: 3.8 mmol/L (ref 3.5–5.1)
Sodium: 162 mmol/L (ref 135–145)
TCO2: 21 mmol/L — ABNORMAL LOW (ref 22–32)
pCO2 arterial: 35.7 mmHg (ref 32.0–48.0)
pH, Arterial: 7.358 (ref 7.350–7.450)
pO2, Arterial: 97 mmHg (ref 83.0–108.0)

## 2021-12-05 LAB — BASIC METABOLIC PANEL
BUN: 30 mg/dL — ABNORMAL HIGH (ref 8–23)
BUN: 35 mg/dL — ABNORMAL HIGH (ref 8–23)
CO2: 20 mmol/L — ABNORMAL LOW (ref 22–32)
CO2: 20 mmol/L — ABNORMAL LOW (ref 22–32)
Calcium: 7.4 mg/dL — ABNORMAL LOW (ref 8.9–10.3)
Calcium: 7.6 mg/dL — ABNORMAL LOW (ref 8.9–10.3)
Chloride: >130 mmol/L (ref 98–111)
Chloride: >130 mmol/L (ref 98–111)
Creatinine, Ser: 1.16 mg/dL (ref 0.61–1.24)
Creatinine, Ser: 1.36 mg/dL — ABNORMAL HIGH (ref 0.61–1.24)
GFR, Estimated: >60 mL/min (ref >60)
GFR, Estimated: 55 mL/min — ABNORMAL LOW (ref >60)
Glucose, Bld: 107 mg/dL — ABNORMAL HIGH (ref 70–99)
Glucose, Bld: 127 mg/dL — ABNORMAL HIGH (ref 70–99)
Potassium: 3.7 mmol/L (ref 3.5–5.1)
Potassium: 4.2 mmol/L (ref 3.5–5.1)
Sodium: 159 mmol/L — ABNORMAL HIGH (ref 135–145)
Sodium: 160 mmol/L — ABNORMAL HIGH (ref 135–145)

## 2021-12-05 LAB — CBC
HCT: 42.7 % (ref 39.0–52.0)
Hemoglobin: 13.7 g/dL (ref 13.0–17.0)
MCH: 30.1 pg (ref 26.0–34.0)
MCHC: 32.1 g/dL (ref 30.0–36.0)
MCV: 93.8 fL (ref 80.0–100.0)
Platelets: 222 10*3/uL (ref 150–400)
RBC: 4.55 MIL/uL (ref 4.22–5.81)
RDW: 16.4 % — ABNORMAL HIGH (ref 11.5–15.5)
WBC: 11.5 10*3/uL — ABNORMAL HIGH (ref 4.0–10.5)
nRBC: 0.3 % — ABNORMAL HIGH (ref 0.0–0.2)

## 2021-12-05 LAB — GLUCOSE, CAPILLARY
Glucose-Capillary: 125 mg/dL — ABNORMAL HIGH (ref 70–99)
Glucose-Capillary: 88 mg/dL (ref 70–99)
Glucose-Capillary: 108 mg/dL — ABNORMAL HIGH (ref 70–99)
Glucose-Capillary: 114 mg/dL — ABNORMAL HIGH (ref 70–99)
Glucose-Capillary: 149 mg/dL — ABNORMAL HIGH (ref 70–99)
Glucose-Capillary: 117 mg/dL — ABNORMAL HIGH (ref 70–99)

## 2021-12-05 LAB — PHOSPHORUS: Phosphorus: 4 mg/dL (ref 2.5–4.6)

## 2021-12-05 LAB — MRSA NEXT GEN BY PCR, NASAL: MRSA by PCR Next Gen: NOT DETECTED

## 2021-12-05 LAB — AMMONIA: Ammonia: 53 umol/L — ABNORMAL HIGH (ref 9–35)

## 2021-12-05 LAB — TRIGLYCERIDES: Triglycerides: 356 mg/dL — ABNORMAL HIGH (ref <150)

## 2021-12-05 LAB — MAGNESIUM: Magnesium: 2.7 mg/dL — ABNORMAL HIGH (ref 1.7–2.4)

## 2021-12-05 LAB — CK: Total CK: 1992 U/L — ABNORMAL HIGH (ref 49–397)

## 2021-12-05 IMAGING — DX DG ABD PORTABLE 1V
1 series · 1 of 1 positions shown · non-contrast
Comparison: [DATE] abdominal radiograph

CLINICAL DATA: Tube placement

EXAM:
PORTABLE ABDOMEN - 1 VIEW

[abdomen supine]
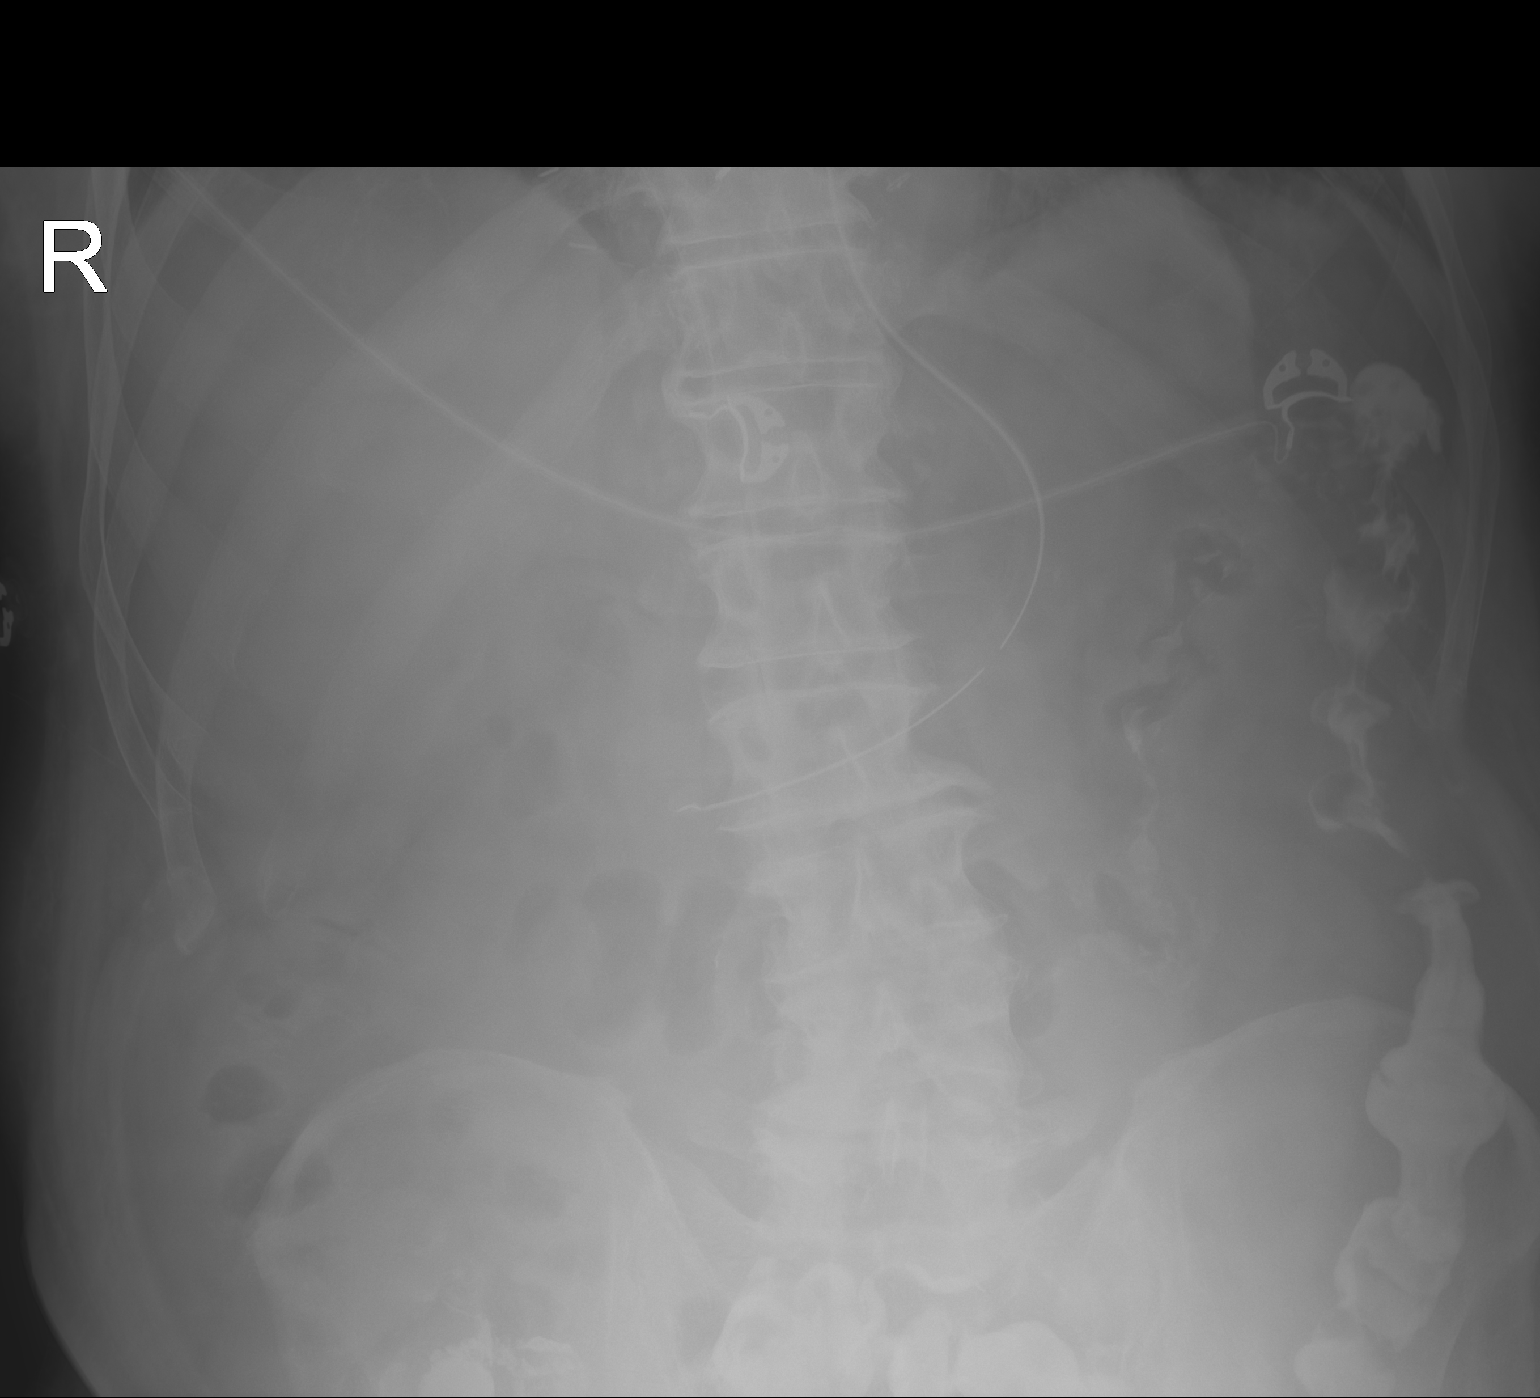

[1 of 1 positions shown; findings below may reference images not displayed]

FINDINGS: Enteric tube terminates in the distal body of the stomach. No
dilated small bowel loops. Retained oral contrast in the left colon.
No evidence of pneumatosis or pneumoperitoneum.
IMPRESSION: Enteric tube terminates in the distal body of the stomach.

## 2021-12-05 IMAGING — DX DG ABD PORTABLE 1V
1 series · 1 of 1 positions shown · non-contrast
Comparison: [DATE].

CLINICAL DATA: A 75-year-old male presents with cerebellar stroke.

EXAM:
PORTABLE ABDOMEN - 1 VIEW

[abdomen]
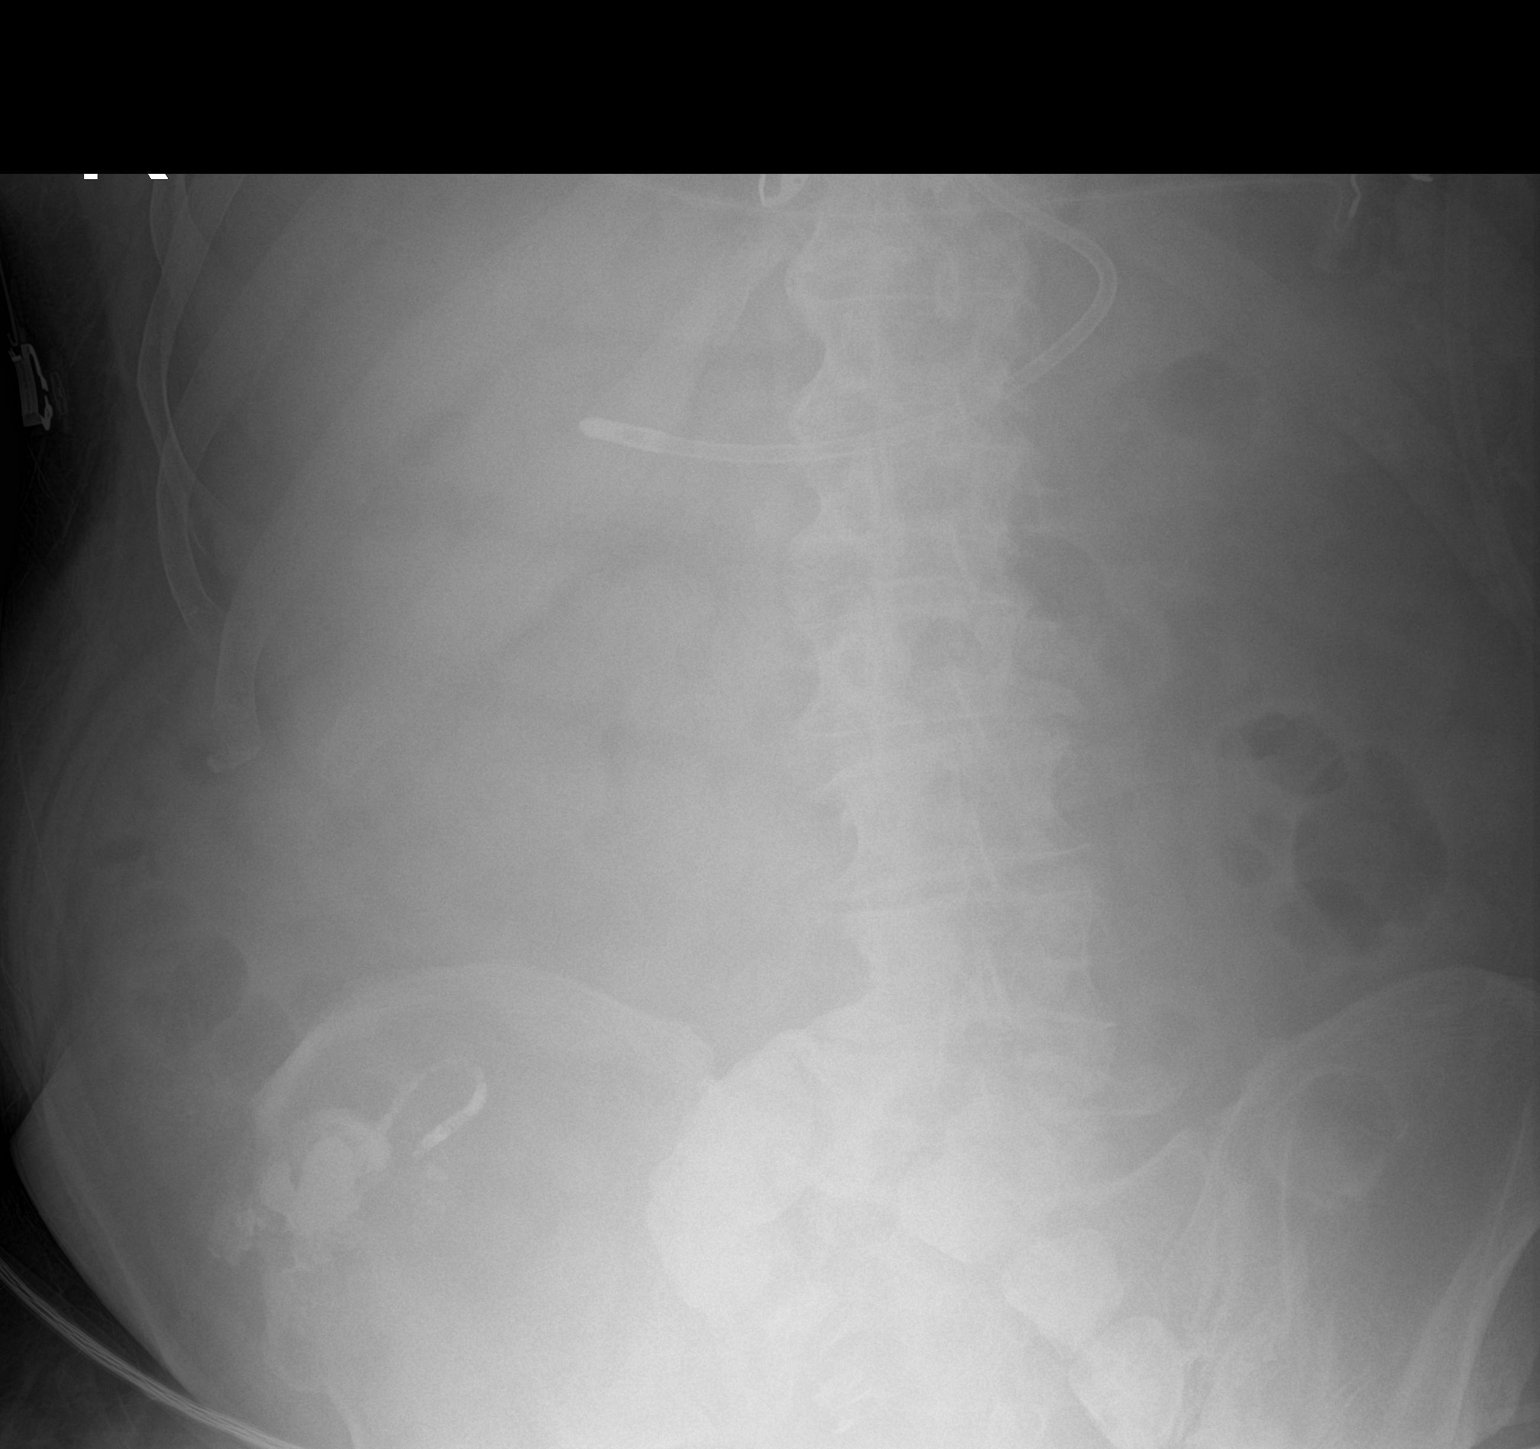

[1 of 1 positions shown; findings below may reference images not displayed]

FINDINGS: EKG leads project over the upper abdomen. There is a feeding tube at
the level of the antrum/pylorus tip directed distally.

Contrast present in the appendix and in the distal colon. No signs
of gross bowel dilation.

Spinal degenerative changes.  No acute regional skeletal process.
IMPRESSION: Feeding tube at the level of the antrum/pylorus tip directed
distally.

## 2021-12-05 IMAGING — CT CT HEAD W/O CM
3 of 4 series · 13 of 47 positions shown, 15 images · non-contrast
Comparison: Altered mental status

CLINICAL DATA: [DATE]

EXAM:
CT HEAD WITHOUT CONTRAST
TECHNIQUE: Contiguous axial images were obtained from the base of the skull
through the vertex without intravenous contrast.

[Series 3: head without · axial · non-contrast · 0.49mm/px · z∈[-174,-29]mm · 7 of 39 slices shown, 9 images]
[im 5/39  brain]
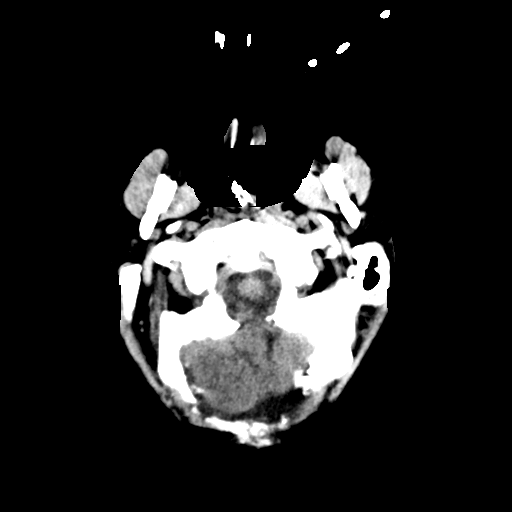
[im 5/39  bone]
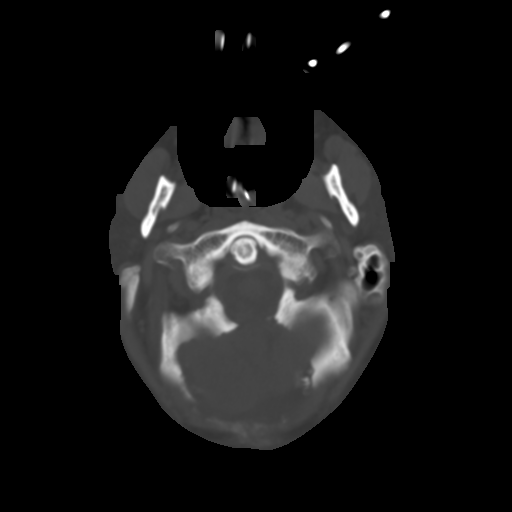
[im 10/39  brain]
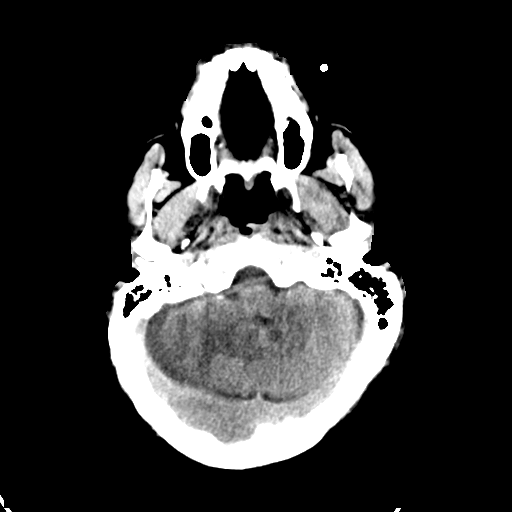
[im 15/39  brain]
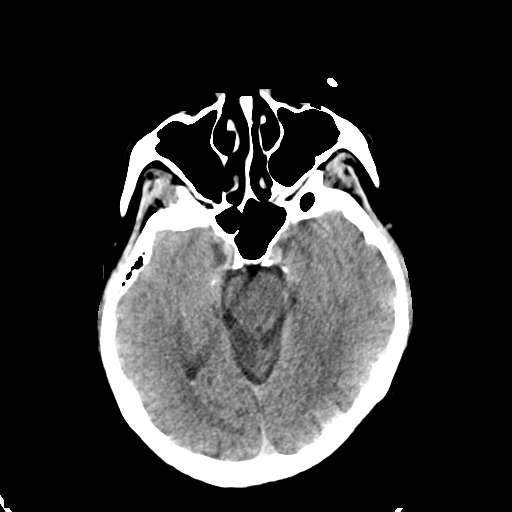
[im 20/39  brain]
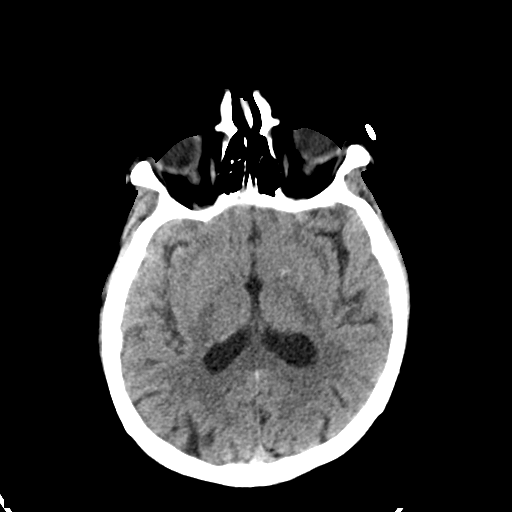
[im 24/39  brain]
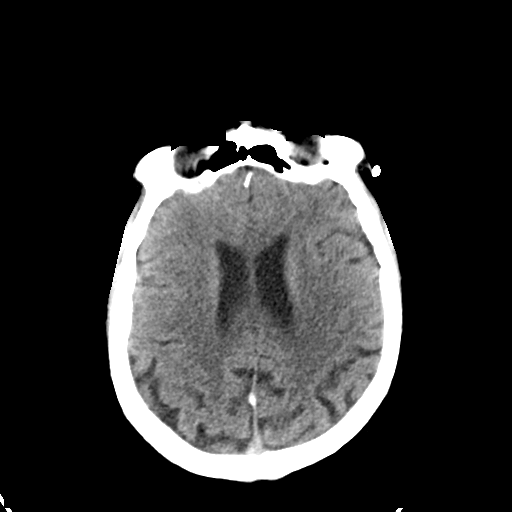
[im 24/39  bone]
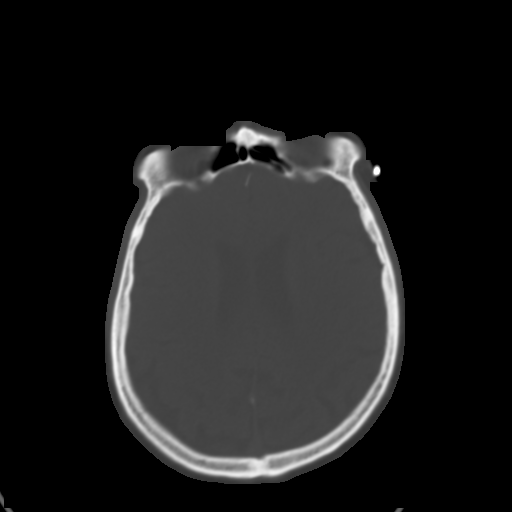
[im 29/39  brain]
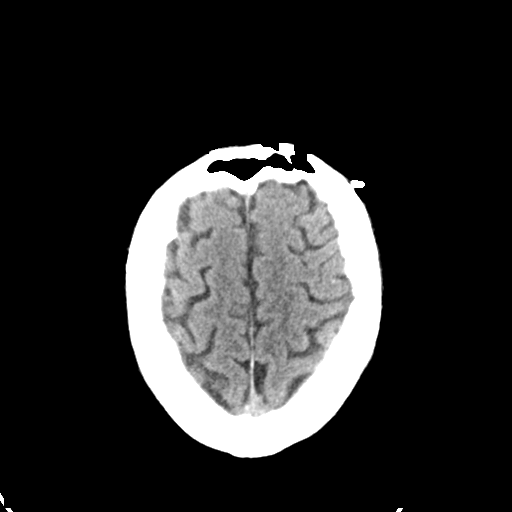
[im 34/39  brain]
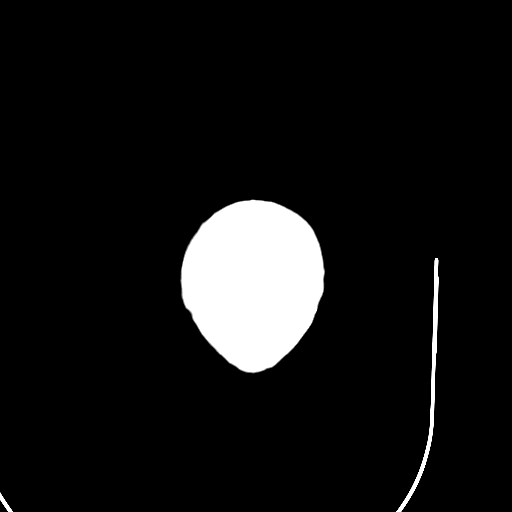

[Series 5: head without cor · coronal · non-contrast · 0.38mm/px · 3 of 73 slices shown]
[im 29/73  brain]
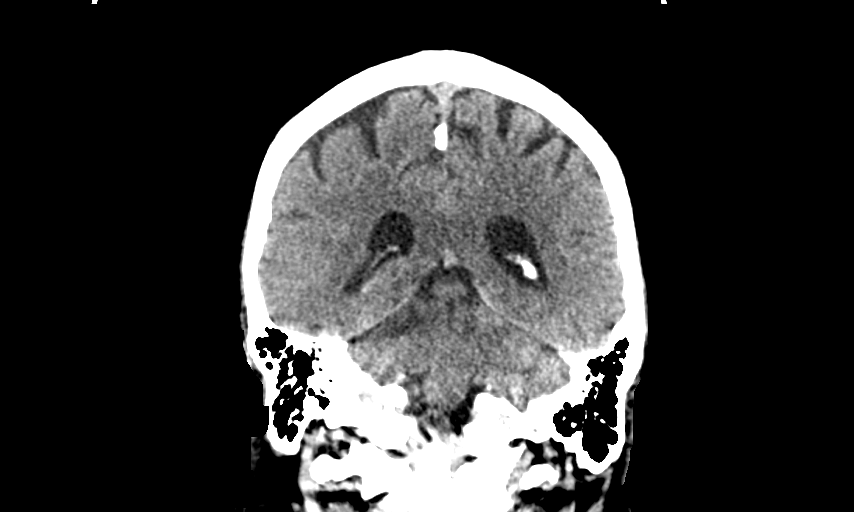
[im 34/73  brain]
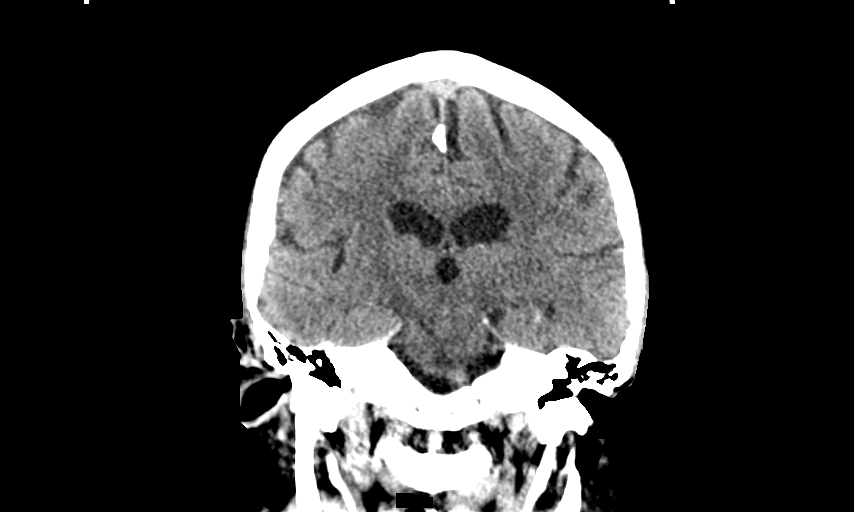
[im 39/73  brain]
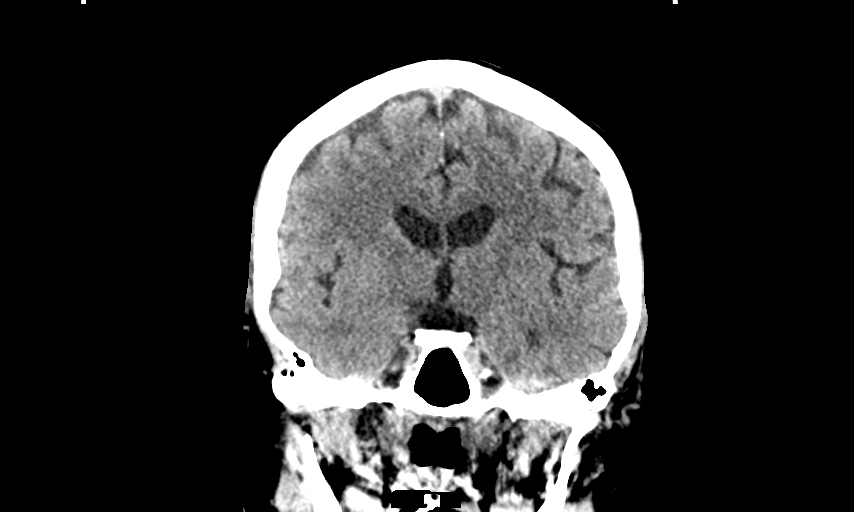

[Series 6: head without sag · sagittal · non-contrast · 0.38mm/px · 3 of 65 slices shown]
[im 22/65  brain]
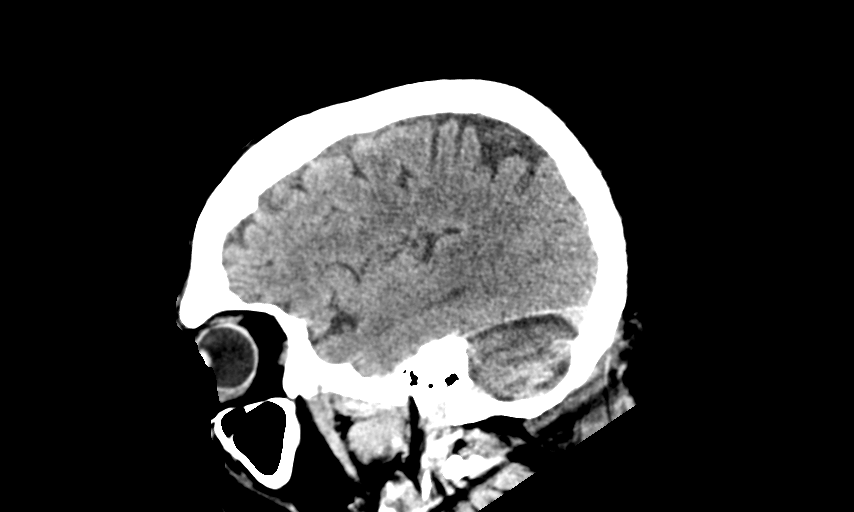
[im 33/65  brain]
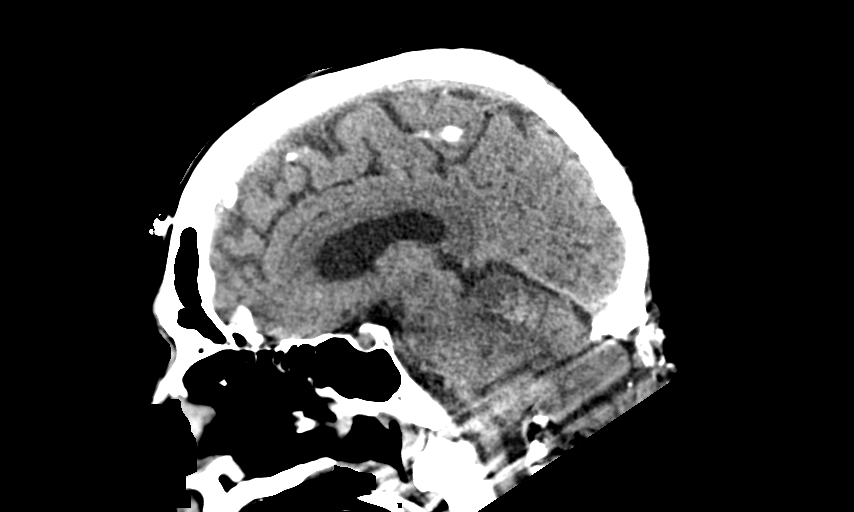
[im 43/65  brain]
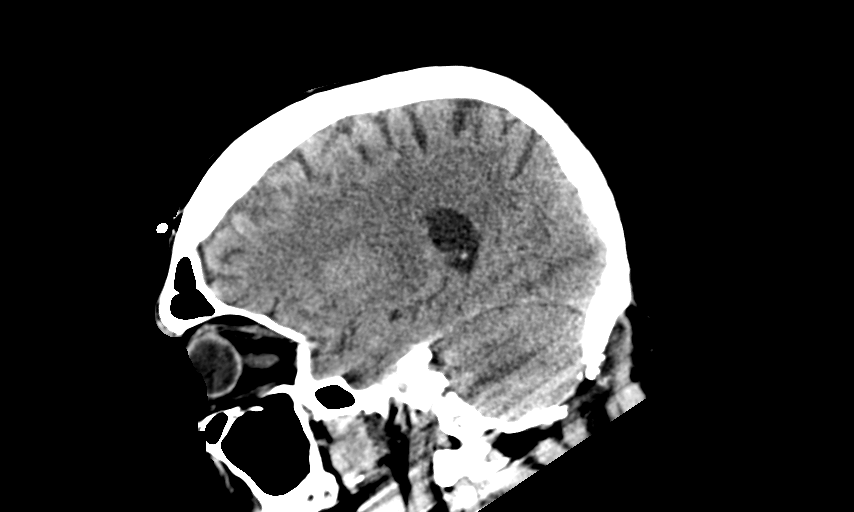

[13 of 47 positions shown; findings below may reference images not displayed]

FINDINGS: Brain: Decompressive suboccipital craniotomy again noted. Evolving
infarct within the right cerebellar hemisphere again noted
demonstrating mild mass effect upon the fourth ventricle and with
stable protrusion of the right cerebellar hemisphere through the
craniotomy defect. Ventricular size is normal. Trace layering
hemorrhage is seen with the except ule horn of the lateral
ventricles bilaterally, decreased since prior examination.

No interval infarct. No interval hemorrhage. Mild periventricular
white matter changes are present likely reflecting the sequela of
small vessel ischemia.

Vascular: No asymmetric hyperdense vasculature at the skull base.

Skull: No acute fracture

Sinuses/Orbits: Orbits are unremarkable. Paranasal sinuses are
clear.

Other: Mastoid air cells and middle ear cavities are clear.
IMPRESSION: Evolving infarct within the right cerebral hemisphere demonstrating
stable mild mass effect upon the right fourth ventricle and
unchanged protrusion through the decompressive suboccipital
craniotomy defect.

Decreasing, minimal intraventricular hemorrhage. No interval
hemorrhage or infarct.

## 2021-12-05 MED ORDER — FENTANYL BOLUS VIA INFUSION
25.0000 ug | INTRAVENOUS | Status: DC | PRN
  Administered 2021-12-05: 10:00:00 50 ug via INTRAVENOUS
  Filled 2021-12-05: qty 100

## 2021-12-05 MED ORDER — POTASSIUM CHLORIDE 20 MEQ PO PACK
40.0000 meq | PACK | Freq: Once | ORAL | Status: AC
  Administered 2021-12-05: 09:00:00 40 meq
  Filled 2021-12-05: qty 2

## 2021-12-05 MED ORDER — FENTANYL CITRATE PF 50 MCG/ML IJ SOSY: 50.0000 ug | PREFILLED_SYRINGE | Freq: Once | INTRAMUSCULAR | Status: DC

## 2021-12-05 MED ORDER — FENTANYL 2500MCG IN NS 250ML (10MCG/ML) PREMIX INFUSION: 25.0000 ug/h | INTRAVENOUS | Status: DC

## 2021-12-05 MED ORDER — DOCUSATE SODIUM 50 MG/5ML PO LIQD
100.0000 mg | Freq: Two times a day (BID) | ORAL | Status: DC
  Administered 2021-12-05: 22:00:00 100 mg
  Filled 2021-12-05 (×2): qty 10

## 2021-12-05 MED ORDER — POLYETHYLENE GLYCOL 3350 17 G PO PACK
17.0000 g | PACK | Freq: Every day | ORAL | Status: DC
  Filled 2021-12-05: qty 1

## 2021-12-05 MED ORDER — CHLORHEXIDINE GLUCONATE 0.12% ORAL RINSE (MEDLINE KIT)
15.0000 mL | Freq: Two times a day (BID) | OROMUCOSAL | Status: DC
  Administered 2021-12-05 – 2021-12-08 (×6): 15 mL via OROMUCOSAL

## 2021-12-05 MED ORDER — VANCOMYCIN HCL 1500 MG/300ML IV SOLN
1500.0000 mg | INTRAVENOUS | Status: DC
  Administered 2021-12-06: 1500 mg via INTRAVENOUS
  Filled 2021-12-05: qty 300

## 2021-12-05 MED ORDER — ORAL CARE MOUTH RINSE
15.0000 mL | OROMUCOSAL | Status: DC
  Administered 2021-12-05 – 2021-12-09 (×38): 15 mL via OROMUCOSAL

## 2021-12-05 NOTE — Progress Notes (Signed)
[place] Progress Note Patient Name: [name] DOB: 09-Oct-1946 MRN: [id number]   Date of Service  12/05/2021  HPI/Events of Note  Notified of Cl+ = 130 --> >130. Na+ = 162 --> 159.  eICU Interventions  Plan: Continue ordered free water 200 mL per tube Q 4 hours. Continue to trend Na+ and Cl-.     Intervention Category Major Interventions: Electrolyte abnormality - evaluation and management  Lysle Dingwall 12/05/2021, 6:39 AM

## 2021-12-05 NOTE — Progress Notes (Signed)
Nutrition Follow-up  DOCUMENTATION CODES:   Not applicable  INTERVENTION:   Tube feeding via Cortrak tube:  Jevity 1.5 @ 55 ml/hr (1320 ml per day) Prosource TF 90 ml BID  Provides 2140 kcal, 128 gm protein, 1003 ml free water daily  200 ml free water every 4 hours (to lower Na levels after hypertonic saline) Total free water: 2203 ml  NUTRITION DIAGNOSIS:   Inadequate oral intake related to inability to eat as evidenced by NPO status. Ongoing.   GOAL:   Patient will meet greater than or equal to 90% of their needs Met with TF at goal.   MONITOR:   Diet advancement, TF tolerance  REASON FOR ASSESSMENT:   Rounds, Consult Enteral/tube feeding initiation and management  ASSESSMENT:   Pt with PMH of CAD s/p CABG, HLD, OA, nephrolithiasis, BPH, and prostate cancer admitted with cerebellar stroke 12/11 s/p TNK.     Pt discussed during ICU rounds and with RN. Spoke with pt's sister who is at bedside.   Pt reintubated, cortrak replaced. Hopeful for extubation tomorrow.    12/11 admitted 12/13 pt with worsening edema s/p emergent crani 12/16 failed MBS, only allowed sips with awake 12/20 pt pulled out cortrak, that evening was re-intubated possible aspiration 12/21 s/p cortrak placement   Medications reviewed: colace, protonix, miralax  Fentanyl  Levophed @ 4 mcg  Propofol   Labs reviewed: Na 160 (currently off 3%)  CL: >130, CO2: 20, Ammonia: 53    Diet Order:   Diet Order             Diet NPO time specified Except for: Other (See Comments)  Diet effective now                   EDUCATION NEEDS:   Not appropriate for education at this time  Skin:  Skin Assessment: Reviewed RN Assessment (closed head incision)  Last BM:  12/20 large x 4 - rectal tube placed, BM regimen meds d/c'ed  Height:   Ht Readings from Last 1 Encounters:  11/25/21 5' 10" (1.778 m)    Weight:   Wt Readings from Last 1 Encounters:  12/05/21 87.3 kg    BMI:  Body  mass index is 27.62 kg/m.  Estimated Nutritional Needs:   Kcal:  2100-2300  Protein:  115-125 grams  Fluid:  > 2 l/day  Lockie Pares., RD, LDN, CNSC See AMiON for contact information

## 2021-12-05 NOTE — Progress Notes (Signed)
Late entry Note:  Was notified of worsening encephalopathy by RN. On my evaluation, patient is visibly tachypneic and encephalopathic. Bilateral crackles in all lung fields on ausculation. Vials with satting in mid 80s on room air.  Discussed with PCCM. He is too encephalopathic to tolerate Bipap. PCCM team evaluated and patient was intubated.  STAT CTH stable.   [place] Pager Number [phone]

## 2021-12-05 NOTE — Progress Notes (Signed)
NAME:  [name], MRN:  [id number], DOB:  1946-08-24, LOS: 42 ADMISSION DATE:  11/25/2021, CONSULTATION DATE:  12/13 REFERRING MD:  Marcelle Overlie, CHIEF COMPLAINT:  Stroke symptoms   History of Present Illness:  75 y/o male admitted with a cerebellar stroke on 12/11, received TNKase, he developed increasing mass effect on the 4th ventricle, moved to the ICU. 12/13 he had a suboccipital craniectomy.  Repeat CT 12/14 showed hemorrhagic conversion.  Pertinent  Medical History  CAD, s/p CABG  HLD  OA,  Nephrolithiasis BPH  Prostate cancer   Interim History / Subjective:  Patient possibly aspirated and became hypoxic overnight Required intubation CT head post intubation:Evolving infarct within the right cerebral hemisphere demonstrating stable mild mass effect upon the right fourth ventricle and unchanged protrusion through the decompressive suboccipital craniotomy defect.  Fever improving 79F Intubated on mechanical ventilation 45% Patient awake and following commands On low dose levo On propofol for sedation   Objective   Blood pressure (!) 123/56, pulse 75, temperature 100.2 F (37.9 C), temperature source Axillary, resp. rate (!) 28, height 5\' 10"  (1.778 m), weight 87.3 kg, SpO2 100 %.    Vent Mode: PRVC FiO2 (%):  [40 %-45 %] 45 % Set Rate:  [20 bmp] 20 bmp Vt Set:  [580 mL] 580 mL PEEP:  [5 cmH20] 5 cmH20 Plateau Pressure:  [15 cmH20-20 cmH20] 15 cmH20   Intake/Output Summary (Last 24 hours) at 12/05/2021 3299 Last data filed at 12/05/2021 0100 Gross per 24 hour  Intake 220 ml  Output 750 ml  Net -530 ml    Filed Weights   11/25/21 1200 12/05/21 0416  Weight: 98.1 kg 87.3 kg    Examination: General: critically ill appearing on mech vent HEENT: MM pink/moist; ETT in place Neuro: MAE; PERRL CV: s1s2, no m/r/g PULM:  dim clear BS bilaterally; on mech vent PRVC GI: soft, bsx4 active  Extremities: warm/dry, no edema  Skin: no rashes or lesions    CT head 12/20:  Evolving infarct within the right cerebral hemisphere demonstrating stable mild mass effect upon the right fourth ventricle and unchanged protrusion through the decompressive suboccipital craniotomy defect.  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 and compression of ventral pons s/p posterior decompression with craniectomy Hemorrhagic conversion of R cerebellar infarct P: -Stroke following -PT/OT/SLP -avoid fevers -CBG monitoring for goal <180 -continue ASA; hold plavix due to hemorrhagic conversion -Will need rehab post hospital, looking at [facility] inpatient rehab  Acute respiratory failure w/ hypoxia Possible aspiration pneumonia Right lower lobe pneumonia - s/p 7 days Unasyn P: -continue mech vent prvc 6-8 cc/kg -wean fio2 for sats >92% -vap prevention in place -daily sbt/sat -consider extubation if tolerates SBT well today -continue ceftriaxone for asp ppx -repeat sputum culture -cxr am -wean sedation for RASS 0 to -1  Shock: post intubation and sedation; cultures pending P: -wean levo MAP goal >65 -follow cultures -continue ceftriaxone -wean sedation for RASS 0 to -1  Dysphagia  - Modified barium swallow showing aspiration, now s/p P: -will place cortrak today -continue tube feeds -slp eval when extubated  Fever: improving Leukocytosis: possible aspiration pneumonia P: -continue ceftriaxone -repeat respiratory cultures -trend fever/wbc curve -follow bcx2  Chronic HFpEF Hypertension, uncontrolled CAD P: -currently on levo for hypotension -hold antihypertensives  Induced hypernatremia - hypertonic now off Hypokalemia Hypocalcemia P: -giving 40 meq K per tube -trend bmp/mag and replete as needed  Hyperactive delirium P: -will hold seroquel while on propofol  Proximal  atrial flutter P: -diltiazem on hold due to hypotension  BPH P: -Continue Flomax -trend uop   Best Practice (right click and "Reselect all  SmartList Selections" daily)   Diet/type: NPO tube feeds DVT prophylaxis: SCD GI prophylaxis: PPI Lines: N/A Foley:  N/A Code Status:  full code Last date of multidisciplinary goals of care discussion [12/18: OK with CPR and short term intubation but would not want prolonged life support.  Would reconsider need for mechanical ventilation if no improvement after 5-7 days. 12/21: updated wife and patient at bedside]  Critical care time: 35 minutes   JD Rexene Agent [place] [facility] 12/05/2021, 8:38 AM  Please see [email or web address] for pager details.  From 7A-7P if no response, please call [phone]. After hours, please call ELink [phone].

## 2021-12-05 NOTE — Progress Notes (Signed)
[place] Progress Note Patient Name: [name] DOB: 11/30/1946 MRN: [id number]   Date of Service  12/05/2021  HPI/Events of Note  ABG on 50%/PRVC 20/TV 580/P 5 = 7.358/35.7/97/20.1.  eICU Interventions  Continue present ventilator management.     Intervention Category Major Interventions: Respiratory failure - evaluation and management  Lysle Dingwall 12/05/2021, 12:37 AM

## 2021-12-05 NOTE — Procedures (Signed)
Cortrak  Tube Type:  Cortrak - 43 inches Tube Location:  Left nare Initial Placement:  Stomach Secured by: Bridle Technique Used to Measure Tube Placement:  Marking at nare/corner of mouth Cortrak Secured At:  70 cm  Cortrak Tube Team Note:  Consult received to place a Cortrak feeding tube.   X-ray is required, abdominal x-ray has been ordered by the Cortrak team. Please confirm tube placement before using the Cortrak tube.   If the tube becomes dislodged please keep the tube and contact the Cortrak team at [email or web address] ([email or web address]) for replacement.  If after hours and replacement cannot be delayed, place a NG tube and confirm placement with an abdominal x-ray.    Lesslie Mckeehan MS, RD, LDN Please refer to AMION for RD and/or RD on-call/weekend/after hours pager   

## 2021-12-05 NOTE — Progress Notes (Signed)
[place] Progress Note Patient Name: [name] DOB: August 02, 1946 MRN: [id number]   Date of Service  12/05/2021  HPI/Events of Note  Hypoglycemia - Blood glucose = 67. Patient given D50 already.   eICU Interventions  Restart TF with Jevity 1.5 at 55 mL/hour.     Intervention Category Major Interventions: Other:  Lysle Dingwall 12/05/2021, 3:53 AM

## 2021-12-05 NOTE — Progress Notes (Signed)
Pharmacy Antibiotic Note  [name] is a 75 y.o. male admitted on 11/25/2021 with pneumonia.  Pharmacy has been consulted for Vancomycin dosing. Also on ceftriaxone per MD. Pt admitted last week with stroke. Unable to protect airway 12/20 PM requiring intubation. Restarting antibiotics. SCr improved to 1.16 this morning.  Plan: Adjust vancomycin to 1500mg  IV q24h >>>Estimated AUC: 457 Ceftriaxone 2g IV q24h Monitor clinical progress, c/s, renal function F/u de-escalation plan/LOT, vancomycin levels as indicated   Height: 5\' 10"  (177.8 cm) Weight: 87.3 kg (192 lb 7.4 oz) IBW/kg (Calculated) : 73  Temp (24hrs), Avg:100.2 F (37.9 C), Min:99.3 F (37.4 C), Max:101.1 F (38.4 C)  Recent Labs  Lab 11/30/21 0409 12/01/21 0329 12/03/21 0927 12/04/21 0523 12/04/21 0526 12/05/21 0505  WBC 9.9  --  8.1 7.1 7.2 11.5*  CREATININE 0.96 1.09 1.22 1.29*  --  1.16     Estimated Creatinine Clearance: 57.7 mL/min (by C-G formula based on SCr of 1.16 mg/dL).    Not on [place], PharmD, BCPS Please check AMION for all [place] contact numbers Clinical Pharmacist 12/05/2021 11:05 AM

## 2021-12-05 NOTE — Progress Notes (Signed)
OT Cancellation Note  Patient Details Name: [name] MRN: [id number] DOB: Nov 09, 1946   Cancelled Treatment:    Reason Eval/Treat Not Completed: Patient not medically ready.  Medical issues which prohibited therapy Per RN, attempting to wean this date. RN request to hold therapy. OT will re-attempt at later date.   Whitney Hillegass D Macel Yearsley 12/05/2021, 10:11 AM

## 2021-12-05 NOTE — Progress Notes (Signed)
STROKE TEAM PROGRESS NOTE   INTERVAL HISTORY Patient is seen in his room with his wife at the bedside.  Patient became more delirious yesterday with worsening encephalopathy and became tachypneic with oxygen saturations in the mid 80s with bilateral crackles in all lung fields on auscultation and CCM intubated and electively.  This morning he is resting comfortably on the ventilator with eyes open and following simple basic commands moving all 4 extremities well without weakness.  Repeat CT scan of the head this morning shows stable postoperative changes of decompressive suboccipital craniotomy and large right posterior infarct with stable mild mass-effect on fourth ventricle with decreasing intraventricular hemorrhage.  Chest x-ray yesterday had shown patchy bilateral infrahilar opacities possibly aspiration patient was started on antibiotics vancomycin and ceftriaxone Vitals:   12/05/21 1345 12/05/21 1400 12/05/21 1415 12/05/21 1430  BP: (!) 99/52 (!) 74/45 (!) 84/49 107/61  Pulse: 62 61 60 64  Resp: (!) 28 (!) 29 (!) 28 (!) 24  Temp:      TempSrc:      SpO2: 96% 96% 97% 98%  Weight:      Height:       CBC:  Recent Labs  Lab 12/04/21 0526 12/04/21 2359 12/05/21 0505  WBC 7.2  --  11.5*  NEUTROABS 5.4  --   --   HGB 15.0 12.2* 13.7  HCT 46.5 36.0* 42.7  MCV 94.1  --  93.8  PLT 207  --  350   Basic Metabolic Panel:  Recent Labs  Lab 12/04/21 0523 12/04/21 2359 12/05/21 0505 12/05/21 1128  NA 160*   < > 159* 160*  K 3.3*   < > 3.7 4.2  CL 130*  --  >130* >130*  CO2 23  --  20* 20*  GLUCOSE 117*  --  107* 127*  BUN 30*  --  30* 35*  CREATININE 1.29*  --  1.16 1.36*  CALCIUM 7.9*  --  7.4* 7.6*  MG 2.8*  --  2.7*  --   PHOS 4.1  --  4.0  --    < > = values in this interval not displayed.   Lipid Panel:  Recent Labs  Lab 12/05/21 0505  TRIG 356*    HgbA1c:  No results for input(s): HGBA1C in the last 168 hours.  Urine Drug Screen:  No results for input(s):  LABOPIA, COCAINSCRNUR, LABBENZ, AMPHETMU, THCU, LABBARB in the last 168 hours.   Alcohol Level No results for input(s): ETH in the last 168 hours.  IMAGING past 24 hours CT HEAD WO CONTRAST (5MM)  Result Date: 12/05/2021 CLINICAL DATA:  12/02/2021 EXAM: CT HEAD WITHOUT CONTRAST TECHNIQUE: Contiguous axial images were obtained from the base of the skull through the vertex without intravenous contrast. COMPARISON:  Altered mental status FINDINGS: Brain: Decompressive suboccipital craniotomy again noted. Evolving infarct within the right cerebellar hemisphere again noted demonstrating mild mass effect upon the fourth ventricle and with stable protrusion of the right cerebellar hemisphere through the craniotomy defect. Ventricular size is normal. Trace layering hemorrhage is seen with the except ule horn of the lateral ventricles bilaterally, decreased since prior examination. No interval infarct. No interval hemorrhage. Mild periventricular white matter changes are present likely reflecting the sequela of small vessel ischemia. Vascular: No asymmetric hyperdense vasculature at the skull base. Skull: No acute fracture Sinuses/Orbits: Orbits are unremarkable. Paranasal sinuses are clear. Other: Mastoid air cells and middle ear cavities are clear. IMPRESSION: Evolving infarct within the right cerebral hemisphere demonstrating stable mild mass  effect upon the right fourth ventricle and unchanged protrusion through the decompressive suboccipital craniotomy defect. Decreasing, minimal intraventricular hemorrhage. No interval hemorrhage or infarct. Electronically Signed   By: Fidela Salisbury M.D.   On: 12/05/2021 00:46   DG CHEST PORT 1 VIEW  Result Date: 12/04/2021 CLINICAL DATA:  Code stroke.  Shortness of breath. EXAM: PORTABLE CHEST 1 VIEW COMPARISON:  Chest x-ray 11/29/2021. FINDINGS: Endotracheal tube tip is 5.7 cm above the carina. Patient is status post cardiac surgery. Enteric tube extends below the  diaphragm, distal tip is not included on the image. There is some minimal patchy infrahilar opacities bilaterally. Costophrenic angles are clear. There is no pneumothorax. No acute fractures are seen. IMPRESSION: 1. Patchy bilateral infrahilar opacities may represent aspiration or infection. Electronically Signed   By: Ronney Asters M.D.   On: 12/04/2021 23:22   DG Abd Portable 1V  Result Date: 12/05/2021 CLINICAL DATA:  A 75 year old male presents with cerebellar stroke. EXAM: PORTABLE ABDOMEN - 1 VIEW COMPARISON:  December 05, 2021. FINDINGS: EKG leads project over the upper abdomen. There is a feeding tube at the level of the antrum/pylorus tip directed distally. Contrast present in the appendix and in the distal colon. No signs of gross bowel dilation. Spinal degenerative changes.  No acute regional skeletal process. IMPRESSION: Feeding tube at the level of the antrum/pylorus tip directed distally. Electronically Signed   By: Zetta Bills M.D.   On: 12/05/2021 12:38   DG Abd Portable 1V  Result Date: 12/05/2021 CLINICAL DATA:  Tube placement EXAM: PORTABLE ABDOMEN - 1 VIEW COMPARISON:  11/30/2021 abdominal radiograph FINDINGS: Enteric tube terminates in the distal body of the stomach. No dilated small bowel loops. Retained oral contrast in the left colon. No evidence of pneumatosis or pneumoperitoneum. IMPRESSION: Enteric tube terminates in the distal body of the stomach. Electronically Signed   By: Ilona Sorrel M.D.   On: 12/05/2021 08:09    PHYSICAL EXAM  Temp:  [99.3 F (37.4 C)-100.7 F (38.2 C)] 100.7 F (38.2 C) (12/21 1200) Pulse Rate:  [51-85] 64 (12/21 1430) Resp:  [20-35] 24 (12/21 1430) BP: (71-158)/(42-96) 107/61 (12/21 1430) SpO2:  [87 %-100 %] 98 % (12/21 1430) FiO2 (%):  [40 %-45 %] 40 % (12/21 1100) Weight:  [87.3 kg] 87.3 kg (12/21 0416)  General - Well nourished, well developed elderly Caucasian male, i who is intubated and on ventilatory support  cardiovascular -  Regular rhythm and rate. Respiratory- respirations even and unlabored  NEURO:  Mental Status: AA&Ox3 Speech/Language: Awake alert intubated and sedated.  Follows simple midline and one-step commands  cranial Nerves II - XII - II -no visual field deficit III, IV, VI -full range but had saccadic dysmetria on lateral gaze to the right V - Facial sensation intact bilaterally. VII - Facial movement intact bilaterally. VIII - Hearing & vestibular intact bilaterally. X - Palate elevates symmetrically. XI - Chin turning & shoulder shrug intact bilaterally. XII - Tongue protrusion intact.   Motor Strength -right upper extremity and right lower extremity slightly weaker than left. Motor Tone - Muscle tone was assessed at the neck and appendages and was normal. Reflexes - The patients reflexes were symmetrical in all extremities and he had no pathological reflexes. Sensory - Light touch, temperature/pinprick were assessed and were symmetrical.   Coordination -difficulty with finger-to-nose and medial coordination on the right gait and Station - deferred.   ASSESSMENT/PLAN [name] is a 75 y.o. male with history of CABG x4, hyperlipidemia,  prostate cancer, internal hemorrhoid, and a nonobstructive calculus in right interpole with hematuria presenting with acute onset vertigo nausea vomiting and an inability to walk.  At 10 AM on November 25, 2021 patient developed vertigo nausea and vomiting.  He also noted abdominal discomfort.  A code stroke was called on his arrival to triage.  Once the patient was in the CT scanner he developed more nausea and vomiting he went to a stat MRI which was positive for bilateral cerebellar strokes.  He then developed ataxia in his bilateral upper and lower extremities.  He was then given TNKase and went for a stat CTA of the head.  No LVO was shown on the CTA. Repeat MRI showed progression of edema related to the cerebellar infarcts with mass effect on the 4th  ventricle. Neurosurgery consulted and hypertonic saline initiated. 12/13 suboccipital craniectomy overnight.  Patient tolerated procedure well.  Speech therapy recommends resuming nectar thickened liquids with his diet.  Repeat CT 12/14 shows hemorrhagic transformation of patient's stroke.  Lovenox resumed today as he is post 24 hours from hemorrhagic transformation of his stroke.  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 ASPECTS 10.    Repeat CT-extensive swelling in the areas of the acute cerebellar infarction more extensive on the right than the left.  Mass-effect on the fourth ventricle.  No evidence of ventricular obstruction.  Stable lateral and third ventricles. Post craniectomy CT-Unchanged right greater than left cerebellar infarcts.  No new infarction.  Unchanged narrowing of the fourth ventricle.  No evidence of ventricular obstruction or hydrocephalus. Repeat CT 12/14 New hyperdensity in right cerebellar infarct, likely hemorrhage with no increased mass effect. CTA head & neck unremarkable MRI  acute infarct of the cerebellum bilaterally.  negative for hemorrhage.  MRI Repeat 12/12- Interval progression of edema associated to bilateral cerebellar acute infarcts with mass effect on the fourth ventricle and low lying right cerebellar tonsil. No hydrocephalus CT head 12/14 -right cerebellum hemorrhagic transformation without hydrocephalus CT repeat stable postoperative changes involving infarct resolving intraventricular blood.  No acute abnormalities 2D Echo EF 70-75% LDL 106 HgbA1c 5.8 VTE prophylaxis - SCDs aspirin 81 mg daily prior to admission, now on aspirin 81, lovenox and plavix discontinued due to hemorrhagic transformation of cerebellar stroke. Will consider DOAC in 14 days post stroke if neuro and CT stable Therapy recommendations:  Inpatient rehab- [facility] rehab closer to patients home Disposition: Pending  Cerebellar Edema s/p suboccipital decompression  3% normal saline initiated on 12/12 Sodium goal is 150-155 Na 154 S/p suboccipital decompression with Dr. Zada Finders 3% d/c'd and then resumed due to sodium levels Close neuro monitoring CT 12/13 reveals unchanged infarcts without evidence of hydrocephalus CT head 12/14 - right cerebellum hemorrhagic transformation without hydrocephalus CT head 12/17- Stable cerebellar infarct with hemorrhage.  No hydrocephalus or interval change Titrate down 3% normal saline overnight.  Orders placed  Hypertension Home meds: None Stable on the high end BP <160  Continue cozaar 50 bid, cardizem 60 Q8 Add hydralazine 25 Q8 Taper off Cleviprex as able  Atrial flutter - new diagnosis Cardiology on board IV Cardizem changed to p.o. 60 mg Q8 As needed metoprolol for rate control Consider DOAC in 14 days post stroke if neuro and CT stable  Fever Leukocytosis T-max 101.5->101.3-> 99.3->101.9 WBC 13.3->13.6->10.0->9.9 CXR 12/13 - Left lower lobe consolidation with small adjacent left pleural effusion. Findings are likely either due  to pneumonia or aspiration. On unasyn   Hyperlipidemia Home meds: Atorvastatin 80 mg, resumed in hospital LDL 106, goal < 70 Atorvastatin 80 mg daily Continue statin at discharge  Delirium/agitation Encephalopathy Sundowning On precedex, taper off as able On seroquel 25 Qhs-> increase to 50 tonight Could be due to  . Fever/aspiration pneumonia Repeat CT 12/14 reveals no hydrocephalus Emphasize sleep hygiene Added valproic acid 500 mg 3 times daily  Dysphagia NPO for now MBSS done 12/16 [place] ordered. Patient is only able to have sparing thickened liquids currently Will start TF and meds via cortrak Aspiration Pneumonia : Intubated 12/04/2021.  Started on vancomycin and ceftriaxone  Other Stroke Risk Factors Advanced Age >/= 57   Obesity, Body mass index is 27.62 kg/m., BMI >/= 30 associated with increased.  Use of oral blood pressure medications.  Risk, recommend weight loss, diet and exercise as appropriate  Coronary artery disease s/p CABG  Other Active Problems Abdominal discomfort CTA abdomen pelvis-no evidence of active GI bleed.  Moderate aortic atherosclerosis.  Diverticulosis with no evidence of diverticulitis. Internal hemorrhoid Nonobstructive calculus in right interpole Hematuria one month ago Flomax DC'd and changed to Urecholine 10 mg 3 times daily Respiratory distress and low oxygen sats-presumed aspiration pneumonia intubated 12/04/2021   Hospital day # 10  Patient became more encephalopathic and developed tachypnea and low oxygen saturations requiring elective intubation.  Chest x-ray suggests infiltrates raising possibility of aspiration pneumonia and he will be started on vancomycin and ceftriaxone.  Recommend continue antibiotics and ventilatory support for a few days then consider 1 extubation.  Long discussion with patient's wife regarding his prognosis which is reasonable and willing to support him for few days.  He does not want long-term clinical support, tracheostomy.  Discussed with Dr.  Verlee Monte critical care medicine this patient is critically ill and at significant risk of neurological worsening, death and care requires constant monitoring of vital signs, hemodynamics,respiratory and cardiac monitoring, extensive review of multiple databases, frequent neurological assessment, discussion with family, other specialists and medical decision making of high complexity.I have made any additions or clarifications directly to the above note.This critical care time does not reflect procedure time, or teaching time or supervisory time of PA/NP/Med Resident etc but could involve care discussion time.  I spent 35 minutes of neurocritical care time  in the care of  this patient.      Antony Contras,  MD Medical Director [place] Pager: [phone] 12/05/2021 3:04 PM    To contact Stroke Continuity provider, please refer to [email or web address] After hours, contact General Neurology

## 2021-12-05 NOTE — Progress Notes (Signed)
SLP Cancellation Note  Patient Details Name: [name] MRN: [id number] DOB: March 06, 1946   Cancelled treatment:       Reason Eval/Treat Not Completed: Medical issues which prohibited therapy. Pt now intubated. Will keep on caseload and wait for readiness to resume therapy.    [name], [name] 12/05/2021, 9:26 AM

## 2021-12-05 NOTE — Progress Notes (Signed)
PT Cancellation Note  Patient Details Name: [name] MRN: [id number] DOB: Dec 16, 1946   Cancelled Treatment:    Reason Eval/Treat Not Completed: Medical issues which prohibited therapy Per RN, attempting to wean this date. RN request to hold therapy. PT will re-attempt at later date.   Sanai Frick A. Gilford Rile PT, DPT Acute Rehabilitation Services Pager [phone] Office [phone]    [name] 12/05/2021, 9:05 AM

## 2021-12-06 ENCOUNTER — Inpatient Hospital Stay (HOSPITAL_COMMUNITY): Payer: Medicare Other

## 2021-12-06 DIAGNOSIS — J9601 Acute respiratory failure with hypoxia: Secondary | ICD-10-CM

## 2021-12-06 DIAGNOSIS — Z515 Encounter for palliative care: Secondary | ICD-10-CM | POA: Diagnosis not present

## 2021-12-06 DIAGNOSIS — Z7189 Other specified counseling: Secondary | ICD-10-CM | POA: Diagnosis not present

## 2021-12-06 LAB — BASIC METABOLIC PANEL
Anion gap: 5 (ref 5–15)
Anion gap: 5 (ref 5–15)
BUN: 37 mg/dL — ABNORMAL HIGH (ref 8–23)
BUN: 32 mg/dL — ABNORMAL HIGH (ref 8–23)
BUN: 27 mg/dL — ABNORMAL HIGH (ref 8–23)
CO2: 22 mmol/L (ref 22–32)
CO2: 23 mmol/L (ref 22–32)
CO2: 24 mmol/L (ref 22–32)
Calcium: 7.5 mg/dL — ABNORMAL LOW (ref 8.9–10.3)
Calcium: 7.8 mg/dL — ABNORMAL LOW (ref 8.9–10.3)
Calcium: 8 mg/dL — ABNORMAL LOW (ref 8.9–10.3)
Chloride: 128 mmol/L — ABNORMAL HIGH (ref 98–111)
Chloride: 129 mmol/L — ABNORMAL HIGH (ref 98–111)
Creatinine, Ser: 1.18 mg/dL (ref 0.61–1.24)
Creatinine, Ser: 1.11 mg/dL (ref 0.61–1.24)
Creatinine, Ser: 0.95 mg/dL (ref 0.61–1.24)
GFR, Estimated: >60 mL/min (ref >60)
GFR, Estimated: >60 mL/min (ref >60)
GFR, Estimated: >60 mL/min (ref >60)
Glucose, Bld: 134 mg/dL — ABNORMAL HIGH (ref 70–99)
Glucose, Bld: 118 mg/dL — ABNORMAL HIGH (ref 70–99)
Glucose, Bld: 131 mg/dL — ABNORMAL HIGH (ref 70–99)
Potassium: 3.3 mmol/L — ABNORMAL LOW (ref 3.5–5.1)
Potassium: 3.4 mmol/L — ABNORMAL LOW (ref 3.5–5.1)
Potassium: 3.7 mmol/L (ref 3.5–5.1)
Sodium: 156 mmol/L — ABNORMAL HIGH (ref 135–145)
Sodium: 156 mmol/L — ABNORMAL HIGH (ref 135–145)
Sodium: 158 mmol/L — ABNORMAL HIGH (ref 135–145)

## 2021-12-06 LAB — CBC
HCT: 39.3 % (ref 39.0–52.0)
Hemoglobin: 12.3 g/dL — ABNORMAL LOW (ref 13.0–17.0)
MCH: 30.5 pg (ref 26.0–34.0)
MCHC: 31.3 g/dL (ref 30.0–36.0)
MCV: 97.5 fL (ref 80.0–100.0)
Platelets: 195 10*3/uL (ref 150–400)
RBC: 4.03 MIL/uL — ABNORMAL LOW (ref 4.22–5.81)
RDW: 16.6 % — ABNORMAL HIGH (ref 11.5–15.5)
WBC: 12.1 10*3/uL — ABNORMAL HIGH (ref 4.0–10.5)
nRBC: 0 % (ref 0.0–0.2)

## 2021-12-06 LAB — POCT I-STAT 7, (LYTES, BLD GAS, ICA,H+H)
Acid-base deficit: 3 mmol/L — ABNORMAL HIGH (ref 0.0–2.0)
Bicarbonate: 21.8 mmol/L (ref 20.0–28.0)
Calcium, Ion: 1.24 mmol/L (ref 1.15–1.40)
HCT: 32 % — ABNORMAL LOW (ref 39.0–52.0)
Hemoglobin: 10.9 g/dL — ABNORMAL LOW (ref 13.0–17.0)
O2 Saturation: 98 %
Patient temperature: 98.3
Potassium: 4 mmol/L (ref 3.5–5.1)
Sodium: 158 mmol/L — ABNORMAL HIGH (ref 135–145)
TCO2: 23 mmol/L (ref 22–32)
pCO2 arterial: 35.6 mmHg (ref 32.0–48.0)
pH, Arterial: 7.395 (ref 7.350–7.450)
pO2, Arterial: 105 mmHg (ref 83.0–108.0)

## 2021-12-06 LAB — GLUCOSE, CAPILLARY
Glucose-Capillary: 109 mg/dL — ABNORMAL HIGH (ref 70–99)
Glucose-Capillary: 110 mg/dL — ABNORMAL HIGH (ref 70–99)
Glucose-Capillary: 120 mg/dL — ABNORMAL HIGH (ref 70–99)
Glucose-Capillary: 126 mg/dL — ABNORMAL HIGH (ref 70–99)
Glucose-Capillary: 128 mg/dL — ABNORMAL HIGH (ref 70–99)
Glucose-Capillary: 133 mg/dL — ABNORMAL HIGH (ref 70–99)
Glucose-Capillary: 97 mg/dL (ref 70–99)

## 2021-12-06 LAB — MAGNESIUM: Magnesium: 2.8 mg/dL — ABNORMAL HIGH (ref 1.7–2.4)

## 2021-12-06 LAB — TRIGLYCERIDES: Triglycerides: 319 mg/dL — ABNORMAL HIGH (ref <150)

## 2021-12-06 LAB — CK: Total CK: 977 U/L — ABNORMAL HIGH (ref 49–397)

## 2021-12-06 IMAGING — DX DG CHEST 1V PORT
1 series · 1 of 1 positions shown · non-contrast
Comparison: [DATE]

CLINICAL DATA: Acute respiratory failure with hypoxia

EXAM:
PORTABLE CHEST 1 VIEW

[chest]
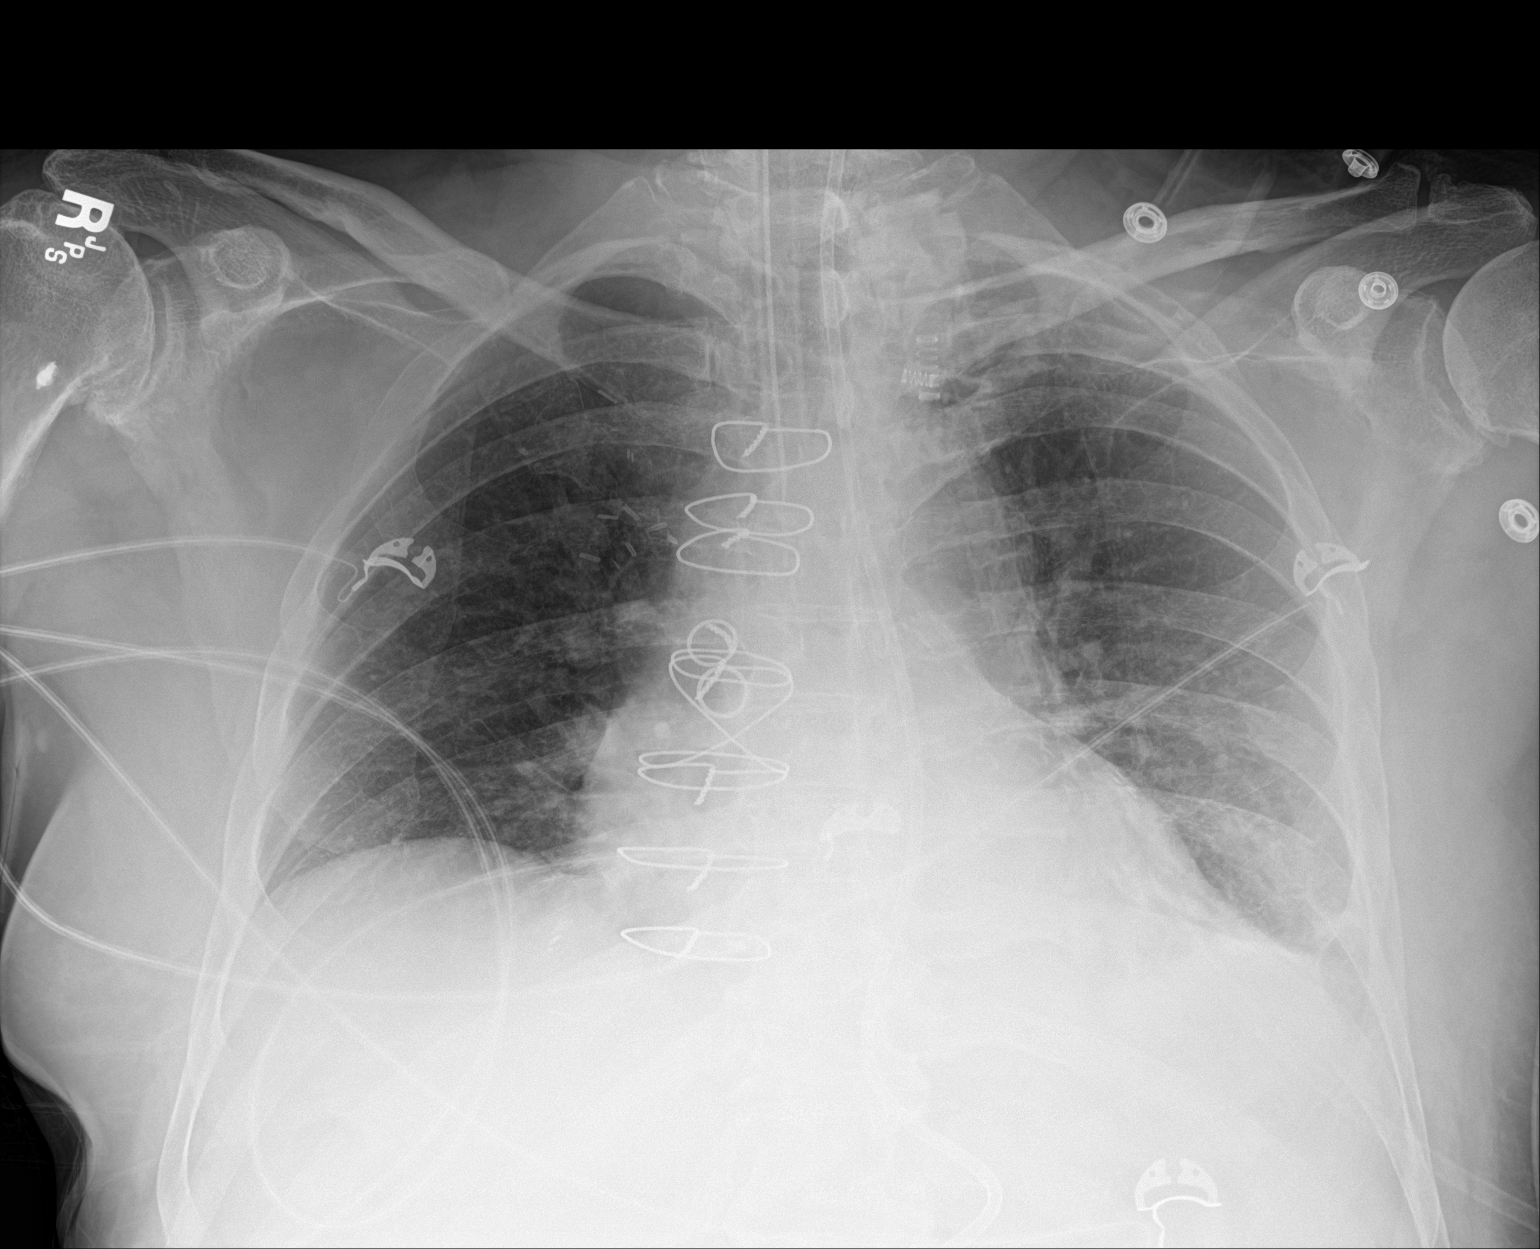

[1 of 1 positions shown; findings below may reference images not displayed]

FINDINGS: Prior CABG. Endotracheal to is 3 cm above the carina. Feeding tube
enters the stomach. Heart is normal size. No confluent opacity on
the right. Left basilar opacity, favor atelectasis. No effusions or
acute bony abnormality.
IMPRESSION: Low lung volumes.  Left base atelectasis.

## 2021-12-06 MED ORDER — DOCUSATE SODIUM 50 MG/5ML PO LIQD: 100.0000 mg | Freq: Two times a day (BID) | ORAL | Status: DC | PRN

## 2021-12-06 MED ORDER — POTASSIUM CHLORIDE 20 MEQ PO PACK
20.0000 meq | PACK | ORAL | Status: AC
  Administered 2021-12-06 (×2): 20 meq
  Filled 2021-12-06 (×2): qty 1

## 2021-12-06 MED ORDER — FENTANYL BOLUS VIA INFUSION
25.0000 ug | INTRAVENOUS | Status: DC | PRN
  Filled 2021-12-06: qty 100

## 2021-12-06 MED ORDER — POTASSIUM CHLORIDE 10 MEQ/100ML IV SOLN
10.0000 meq | INTRAVENOUS | Status: AC
  Administered 2021-12-06 (×4): 10 meq via INTRAVENOUS
  Filled 2021-12-06 (×4): qty 100

## 2021-12-06 MED ORDER — POTASSIUM CHLORIDE 20 MEQ PO PACK
60.0000 meq | PACK | Freq: Once | ORAL | Status: AC
  Administered 2021-12-06: 18:00:00 60 meq
  Filled 2021-12-06: qty 3

## 2021-12-06 MED ORDER — CLONIDINE HCL 0.1 MG PO TABS
0.1000 mg | ORAL_TABLET | Freq: Two times a day (BID) | ORAL | Status: DC
Start: 2021-12-06 — End: 2021-12-09
  Administered 2021-12-06 – 2021-12-09 (×6): 0.1 mg
  Filled 2021-12-06 (×6): qty 1

## 2021-12-06 MED ORDER — MIDAZOLAM HCL 2 MG/2ML IJ SOLN: 4.0000 mg | Freq: Once | INTRAMUSCULAR | Status: AC

## 2021-12-06 MED ORDER — DEXMEDETOMIDINE HCL IN NACL 400 MCG/100ML IV SOLN
0.4000 ug/kg/h | INTRAVENOUS | Status: DC
Start: 2021-12-06 — End: 2021-12-10
  Administered 2021-12-06: 11:00:00 0.4 ug/kg/h via INTRAVENOUS
  Administered 2021-12-06: 20:00:00 0.5 ug/kg/h via INTRAVENOUS
  Administered 2021-12-07: 02:00:00 0.7 ug/kg/h via INTRAVENOUS
  Administered 2021-12-07: 17:00:00 0.4 ug/kg/h via INTRAVENOUS
  Administered 2021-12-07: 08:00:00 0.7 ug/kg/h via INTRAVENOUS
  Administered 2021-12-08: 22:00:00 0.9 ug/kg/h via INTRAVENOUS
  Administered 2021-12-08: 02:00:00 0.6 ug/kg/h via INTRAVENOUS
  Administered 2021-12-09: 10:00:00 0.5 ug/kg/h via INTRAVENOUS
  Administered 2021-12-09 (×2): 0.9 ug/kg/h via INTRAVENOUS
  Administered 2021-12-10: 01:00:00 0.8 ug/kg/h via INTRAVENOUS
  Filled 2021-12-06 (×12): qty 100

## 2021-12-06 MED ORDER — FENTANYL 2500MCG IN NS 250ML (10MCG/ML) PREMIX INFUSION
25.0000 ug/h | INTRAVENOUS | Status: DC
Start: 2021-12-06 — End: 2021-12-12
  Administered 2021-12-10: 22:00:00 100 ug/h via INTRAVENOUS
  Administered 2021-12-10 – 2021-12-11 (×2): 25 ug/h via INTRAVENOUS
  Filled 2021-12-06 (×2): qty 250

## 2021-12-06 MED ORDER — POLYETHYLENE GLYCOL 3350 17 G PO PACK: 17.0000 g | PACK | Freq: Every day | ORAL | Status: DC | PRN

## 2021-12-06 MED ORDER — TIZANIDINE HCL 2 MG PO TABS
1.0000 mg | ORAL_TABLET | Freq: Three times a day (TID) | ORAL | Status: DC
  Administered 2021-12-06 – 2021-12-25 (×55): 1 mg
  Filled 2021-12-06 (×62): qty 0.5

## 2021-12-06 NOTE — Progress Notes (Signed)
Date and time results received: 12/06/21 0320  Test: Chloride  Critical Value: Cl= >130  Name of Provider Notified: Oletta Darter  Orders Received? Or Actions Taken?:  MD made aware of critical value. Order to retake BMP @ 0900. Will continue to monitor pt.    Royal Piedra, RN

## 2021-12-06 NOTE — Progress Notes (Signed)
STROKE TEAM PROGRESS NOTE   INTERVAL HISTORY Patient is seen in his room with his wife at the bedside.  He remains intubated due respiratory failure from likely aspiration event on 12/04/2021. This morning he is resting comfortably on the ventilator with eyes open and following simple basic commands moving all 4 extremities well without weakness.  Discussion on 12/06/2021 between Neuro NP, Palliative NP, Dr. Verlee Monte, and [name] (Wife) about proceeding with a tracheostomy if the patient has another aspiration event after extubation. Hope to extubate later today or tomorrow morning.   Vitals:   12/06/21 1108 12/06/21 1200 12/06/21 1300 12/06/21 1400  BP: 137/76 140/80 110/72 125/76  Pulse: (!) 57 (!) 105 94 89  Resp: (!) 28 (!) 27 (!) 25 (!) 22  Temp:  98.3 F (36.8 C)    TempSrc:  Axillary    SpO2: 97% (!) 84% 98% 98%  Weight:      Height:       CBC:  Recent Labs  Lab 12/04/21 0526 12/04/21 2359 12/05/21 0505 12/06/21 0118  WBC 7.2  --  11.5* 12.1*  NEUTROABS 5.4  --   --   --   HGB 15.0   < > 13.7 12.3*  HCT 46.5   < > 42.7 39.3  MCV 94.1  --  93.8 97.5  PLT 207  --  222 195   < > = values in this interval not displayed.    Basic Metabolic Panel:  Recent Labs  Lab 12/04/21 0523 12/04/21 2359 12/05/21 0505 12/05/21 1128 12/06/21 0118 12/06/21 0743  NA 160*   < > 159*   < > 156* 156*  K 3.3*   < > 3.7   < > 3.3* 3.4*  CL 130*  --  >130*   < > CRITICAL RESULT CALLED TO, READ BACK BY AND VERIFIED WITH: 128*  CO2 23  --  20*   < > 22 23  GLUCOSE 117*  --  107*   < > 134* 118*  BUN 30*  --  30*   < > 37* 32*  CREATININE 1.29*  --  1.16   < > 1.18 1.11  CALCIUM 7.9*  --  7.4*   < > 7.5* 7.8*  MG 2.8*  --  2.7*  --  2.8*  --   PHOS 4.1  --  4.0  --   --   --    < > = values in this interval not displayed.    Lipid Panel:  Recent Labs  Lab 12/06/21 0118  TRIG 319*     HgbA1c:  No results for input(s): HGBA1C in the last 168 hours.  Urine Drug Screen:  No results  for input(s): LABOPIA, COCAINSCRNUR, LABBENZ, AMPHETMU, THCU, LABBARB in the last 168 hours.   Alcohol Level No results for input(s): ETH in the last 168 hours.  IMAGING past 24 hours DG Chest Port 1 View  Result Date: 12/06/2021 CLINICAL DATA:  Acute respiratory failure with hypoxia EXAM: PORTABLE CHEST 1 VIEW COMPARISON:  12/04/2021 FINDINGS: Prior CABG. Endotracheal to is 3 cm above the carina. Feeding tube enters the stomach. Heart is normal size. No confluent opacity on the right. Left basilar opacity, favor atelectasis. No effusions or acute bony abnormality. IMPRESSION: Low lung volumes.  Left base atelectasis. Electronically Signed   By: Rolm Baptise M.D.   On: 12/06/2021 08:07    PHYSICAL EXAM  Temp:  [98.3 F (36.8 C)-100.5 F (38.1 C)] 98.3 F (36.8 C) (12/22 1200)  Pulse Rate:  [54-105] 89 (12/22 1400) Resp:  [19-31] 22 (12/22 1400) BP: (89-157)/(50-102) 125/76 (12/22 1400) SpO2:  [84 %-100 %] 98 % (12/22 1400) FiO2 (%):  [40 %] 40 % (12/22 1108)  General - Well nourished, well developed elderly Caucasian male,  who is intubated and on ventilatory support  cardiovascular - Regular rhythm and rate. Respiratory- respirations even and unlabored  NEURO:  Mental Status: Awake alert and interactive.  Follows commands well.  Speech/Language: Awake alert intubated and sedated.  Follows simple midline and one-step commands  cranial Nerves II - XII - II -no visual field deficit III, IV, VI -full range but had saccadic dysmetria on lateral gaze to the right V - Facial sensation intact bilaterally. VII - Facial movement intact bilaterally. VIII - Hearing & vestibular intact bilaterally. X - Palate elevates symmetrically. XI - Chin turning & shoulder shrug intact bilaterally. XII - Tongue protrusion intact.   Motor Strength -right upper extremity and right lower extremity slightly weaker than left. Motor Tone - Muscle tone was assessed at the neck and appendages and was  normal. Reflexes - The patients reflexes were symmetrical in all extremities and he had no pathological reflexes. Sensory - Light touch, temperature/pinprick were assessed and were symmetrical.   Coordination -difficulty with finger-to-nose and medial coordination on the right gait and Station - deferred.   ASSESSMENT/PLAN [name] is a 75 y.o. male with history of CABG x4, hyperlipidemia, prostate cancer, internal hemorrhoid, and a nonobstructive calculus in right interpole with hematuria presenting with acute onset vertigo nausea vomiting and an inability to walk.  At 10 AM on November 25, 2021 patient developed vertigo nausea and vomiting.  He also noted abdominal discomfort.  A code stroke was called on his arrival to triage.  Once the patient was in the CT scanner he developed more nausea and vomiting he went to a stat MRI which was positive for bilateral cerebellar strokes.  He then developed ataxia in his bilateral upper and lower extremities.  He was then given TNKase and went for a stat CTA of the head.  No LVO was shown on the CTA. Repeat MRI showed progression of edema related to the cerebellar infarcts with mass effect on the 4th ventricle. Neurosurgery consulted and hypertonic saline initiated. 12/13 suboccipital craniectomy overnight.  Patient tolerated procedure well.  Speech therapy recommends resuming nectar thickened liquids with his diet.  Repeat CT 12/14 shows hemorrhagic transformation of patient's stroke.  Lovenox resumed today as he is post 24 hours from hemorrhagic transformation of his stroke.  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 ASPECTS 10.    Repeat CT-extensive swelling in the areas of the acute cerebellar infarction more extensive on the right than the left.  Mass-effect on the fourth ventricle.  No evidence of ventricular  obstruction.  Stable lateral and third ventricles. Post craniectomy CT-Unchanged right greater than left cerebellar infarcts.  No new infarction.  Unchanged narrowing of the fourth ventricle.  No evidence of ventricular obstruction or hydrocephalus. Repeat CT 12/14 New hyperdensity in right cerebellar infarct, likely hemorrhage with no increased mass effect. CTA head & neck unremarkable MRI  acute infarct of the cerebellum bilaterally.  negative for hemorrhage.  MRI Repeat 12/12- Interval progression of edema associated to bilateral cerebellar acute infarcts with mass effect on the fourth ventricle and low lying right cerebellar tonsil. No hydrocephalus CT head 12/14 -  right cerebellum hemorrhagic transformation without hydrocephalus CT repeat stable postoperative changes involving infarct resolving intraventricular blood.  No acute abnormalities 2D Echo EF 70-75% LDL 106 HgbA1c 5.8 VTE prophylaxis - SCDs aspirin 81 mg daily prior to admission, now on aspirin 81, lovenox and plavix discontinued due to hemorrhagic transformation of cerebellar stroke. Will consider DOAC in 14 days post stroke if neuro and CT stable Therapy recommendations: Inpatient rehab- [facility] rehab closer to patients home Disposition: Pending  Cerebellar Edema s/p suboccipital decompression  3% normal saline initiated on 12/12 Sodium goal is 150-155 Na 154 S/p suboccipital decompression with Dr. Zada Finders 3% d/c'd and then resumed due to sodium levels Close neuro monitoring CT 12/13 reveals unchanged infarcts without evidence of hydrocephalus CT head 12/14 - right cerebellum hemorrhagic transformation without hydrocephalus CT head 12/17- Stable cerebellar infarct with hemorrhage.  No hydrocephalus or interval change  3% normal saline off  Hypertension Home meds: None Stable on the high end BP <160  Continue cozaar 50 bid, cardizem 60 Q8 Add hydralazine 25 Q8 Taper off Cleviprex as able  Atrial flutter - new  diagnosis Cardiology on board IV Cardizem changed to p.o. 60 mg Q8 As needed metoprolol for rate control Consider DOAC in 14 days post stroke if neuro and CT stable  Fever Leukocytosis T-max 101.5->101.3-> 99.3->101.9 WBC 13.3->13.6->10.0->9.9 CXR 12/13 - Left lower lobe consolidation with small adjacent left pleural effusion. Findings are likely either due to pneumonia or aspiration. On unasyn   Hyperlipidemia Home meds: Atorvastatin 80 mg, resumed in hospital LDL 106, goal < 70 Atorvastatin 80 mg daily Continue statin at discharge  Delirium/agitation Encephalopathy Sundowning On precedex, taper off as able On seroquel 25 Qhs-> increase to 50 tonight Could be due to  . Fever/aspiration pneumonia Repeat CT 12/14 reveals no hydrocephalus Emphasize sleep hygiene Added valproic acid 500 mg 3 times daily  Dysphagia NPO for now MBSS done 12/16 [place] in place. Patient is only able to have sparing thickened liquids currently Will start TF and meds via cortrak Aspiration Pneumonia : Intubated 12/04/2021.  Started on vancomycin and ceftriaxone  Other Stroke Risk Factors Advanced Age >/= 13  Obesity, Body mass index is 27.62 kg/m., BMI >/= 30 associated with increased.  Use of oral blood pressure medications.  Risk, recommend weight loss, diet and exercise as appropriate  Coronary artery disease s/p CABG  Other Active Problems Abdominal discomfort CTA abdomen pelvis-no evidence of active GI bleed.  Moderate aortic atherosclerosis.  Diverticulosis with no evidence of diverticulitis. Internal hemorrhoid Nonobstructive calculus in right interpole Hematuria one month ago Flomax DC'd and changed to Urecholine 10 mg 3 times daily Respiratory distress and low oxygen sats-presumed aspiration pneumonia intubated 12/04/2021   Hospital day # 11  Patient seen and examined by NP/APP with MD. MD to update note as needed.   Janine Ores, DNP, FNP-BC [facility] Pager:  [phone]  I have personally obtained history,examined this patient, reviewed notes, independently viewed imaging studies, participated in medical decision making and plan of care.ROS completed by me personally and pertinent positives fully documented  I have made any additions or clarifications directly to the above note. Agree with note above.  Continue antibiotics for presumed aspiration and wean off ventilatory support as per critical care team.  Long discussion with wife at the bedside regarding patient's neurological status which appears to be improving but in case he develops recurrent respiratory failure due to needs for [place] sedation for his agitation once he is extubated we need to be  clear whether we are going to reintubate him and that is the case then he may need tracheostomy.  She will think about this issue and let us know soon prior to extubating him.  Discussed with Dr. Verlee Monte critical care medicine. This patient is critically ill and at significant risk of neurological worsening, death and care requires constant monitoring of vital signs, hemodynamics,respiratory and cardiac monitoring, extensive review of multiple databases, frequent neurological assessment, discussion with family, other specialists and medical decision making of high complexity.I have made any additions or clarifications directly to the above note.This critical care time does not reflect procedure time, or teaching time or supervisory time of PA/NP/Med Resident etc but could involve care discussion time.  I spent 30 minutes of neurocritical care time  in the care of  this patient.     Antony Contras, MD Medical Director [facility] Pager: [phone] 12/06/2021 2:55 PM     To contact Stroke Continuity provider, please refer to [email or web address] After hours, contact General Neurology

## 2021-12-06 NOTE — Progress Notes (Addendum)
NAME:  [name], MRN:  [id number], DOB:  [birth year], LOS: 16 ADMISSION DATE:  11/25/2021, CONSULTATION DATE:  12/13 REFERRING MD:  Marcelle Overlie, CHIEF COMPLAINT:  Stroke symptoms   History of Present Illness:  75 y/o male admitted with a cerebellar stroke on 12/11, received TNKase, he developed increasing mass effect on the 4th ventricle, moved to the ICU. 12/13 he had a suboccipital craniectomy.  Repeat CT 12/14 showed hemorrhagic conversion.  Pertinent  Medical History  CAD, s/p CABG  HLD  OA,  Nephrolithiasis BPH  Prostate cancer   Interim History / Subjective:  Patient K low overnight being repleted On mechanical ventilation; started on SBT Prop held and on low dose fentanyl Patient awake and following commands Fever improving; 99 F this am  Objective   Blood pressure 118/71, pulse (!) 56, temperature 99.2 F (37.3 C), temperature source Axillary, resp. rate (!) 25, height 5\' 10"  (1.778 m), weight 87.3 kg, SpO2 99 %.    Vent Mode: PRVC FiO2 (%):  [40 %] 40 % Set Rate:  [20 bmp] 20 bmp Vt Set:  [580 mL] 580 mL PEEP:  [5 cmH20] 5 cmH20 Pressure Support:  [10 cmH20] 10 cmH20 Plateau Pressure:  [16 cmH20-20 cmH20] 17 cmH20   Intake/Output Summary (Last 24 hours) at 12/06/2021 0705 Last data filed at 12/06/2021 0700 Gross per 24 hour  Intake 3203.07 ml  Output 2050 ml  Net 1153.07 ml    Filed Weights   11/25/21 1200 12/05/21 0416  Weight: 98.1 kg 87.3 kg    Examination: General: ill appearing on mech vent HEENT: MM pink/moist; ETT in place Neuro: MAE; PERRL; following commands CV: s1s2, no m/r/g PULM:  dim clear BS bilaterally; on mech vent SBT GI: soft, bsx4 active  Extremities: warm/dry, no edema  Skin: no rashes or lesions    CT head 12/20: Evolving infarct within the right cerebral hemisphere demonstrating stable mild mass effect upon the right fourth ventricle and unchanged protrusion through the decompressive suboccipital craniotomy defect.  Resolved  problem list: Shock: post intubation and sedation  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 and compression of ventral pons s/p posterior decompression with craniectomy Hemorrhagic conversion of R cerebellar infarct P: -Stroke following -PT/OT/SLP -avoid fevers -CBG monitoring for goal <180 -continue ASA; hold plavix due to hemorrhagic conversion -Will need rehab post hospital, looking at [facility] inpatient rehab which is close to home  Acute respiratory failure w/ hypoxia Possible aspiration pneumonia Right lower lobe pneumonia - s/p 7 days Unasyn P: -On SBT this morning; will consider extubation -vap prevention in place -mrsa pcr negative; vanc dc'd  -continue ceftriaxone for asp ppx; follow resp cultures  Dysphagia  - Modified barium swallow showing aspiration, now s/p P: -continue cortrak -continue tube feeds -slp eval when extubated  Fever: improving Leukocytosis: possible aspiration pneumonia P: -continue ceftriaxone -negative mrsa pcr; stopped vanc -follow respiratory cultures -trend fever/wbc curve -follow bcx2  Chronic HFpEF Hypertension, uncontrolled CAD P: -will start on clonidine -prn hydralazine  Induced hypernatremia - hypertonic now off Hypokalemia Hypocalcemia P: -K repleted this am -repeat bmp later today -trend bmp/mag and replete as needed  Hyperactive delirium P: -will start on home zanaflex -consider precedex  Proximal atrial flutter P: -HR low 60s; diltiazem on hold  BPH P: -Continue Flomax -trend uop  GOC P: -12/22: spoke with wife and husband at bedside; spoke about if we extubate if he would want to be reintubated we would have to likely place trach.  Wife states that she is unsure that he would want to be trached and that she thinks that would worsen his quality of life. She wants to speak with palliative care on other options and is considering one way extubation. She also wants to speak with  other family members before making decision. We will hold off on extubation at this time.   Best Practice (right click and "Reselect all SmartList Selections" daily)   Diet/type: NPO tube feeds DVT prophylaxis: SCD GI prophylaxis: PPI Lines: N/A Foley:  N/A Code Status:  full code Last date of multidisciplinary goals of care discussion [12/18: OK with CPR and short term intubation but would not want prolonged life support.  Would reconsider need for mechanical ventilation if no improvement after 5-7 days. 12/22: spoke with wife and husband at bedside; spoke about if we extubate if he would want to be reintubated we would have to likely place trach. Wife states that she is unsure that he would want to be trached and that she thinks that would worsen his quality of life. She wants to speak with palliative care on other options and is considering one way extubation. She also wants to speak with other family members before making decision. We will hold off on extubation at this time.]  Critical care time: 35 minutes   JD Rexene Agent [facility] 12/06/2021, 7:05 AM  Please see [email or web address] for pager details.  From 7A-7P if no response, please call [phone]. After hours, please call ELink [phone].

## 2021-12-06 NOTE — Procedures (Signed)
Extubation Procedure Note  Patient Details:   Name: [name] DOB: 17-Jan-1946 MRN: [id number]   Airway Documentation:    Vent end date: 12/06/21 Vent end time: 1535   Evaluation  O2 sats: stable throughout Complications: No apparent complications Patient did tolerate procedure well. Bilateral Breath Sounds: Diminished   Yes  Pt extubated to [place] with RN & RT X2 at bedside and is tolerating well. Positive cuff leak noted. RT will continue to monitor.  Cathie Olden 12/06/2021, 3:41 PM

## 2021-12-06 NOTE — Progress Notes (Signed)
[facility] ADULT ICU REPLACEMENT PROTOCOL   The patient does apply for the [facility] Adult ICU Electrolyte Replacment Protocol based on the criteria listed below:   1.Exclusion criteria: TCTS patients, ECMO patients, and Dialysis patients 2. Is GFR >/= 30 ml/min? Yes.    Patient's GFR today is >60 3. Is SCr </= 2? Yes.   Patient's SCr is 1.18 mg/dL 4. Did SCr increase >/= 0.5 in 24 hours? No. 5.Pt's weight >40kg  Yes.   6. Abnormal electrolyte(s): K+ 3.3  7. Electrolytes replaced per protocol 8.  Call MD STAT for K+ </= 2.5, Phos </= 1, or Mag </= 1 Physician:  [place], [place] 12/06/2021 3:27 AM

## 2021-12-06 NOTE — Progress Notes (Signed)
[facility] Progress Note Patient Name: [name] DOB: 09/23/1946 MRN: [id number]   Date of Service  12/06/2021  HPI/Events of Note  Called d/t Cl- > 130. 3% NaCl has been off for at least 24 hours. Na+ has decreased from 162 --> 156. Goal Na+ = 150-155.  eICU Interventions  Plan: Continue free water 200 mL per tube Q 4 hours.  Repeat BMP at 9 AM.     Intervention Category Major Interventions: Electrolyte abnormality - evaluation and management  Jastin Fore [place] 12/06/2021, 3:30 AM

## 2021-12-06 NOTE — Progress Notes (Signed)
°   12/06/21 1448  Airway 8 mm  Placement Date/Time: 12/04/21 2330   Placed By: ICU physician  Airway Device: Endotracheal Tube  ETT Types: Oral  Size (mm): 8 mm  Secured at (cm) 25 cm  Measured From Lips  Secured Location Right  Secured By Actuary Repositioned Yes  Adult Ventilator Settings  Vent Type Servo i  Humidity HME  Vent Mode PSV;CPAP  FiO2 (%) 40 %  Pressure Support 5 cmH20  PEEP 5 cmH20  Adult Ventilator Measurements  Peak Airway Pressure 11 L/min  Mean Airway Pressure 7 cmH20  Resp Rate Spontaneous 26 br/min  Resp Rate Total 26 br/min  Spont TV 586 mL  Measured Ve 13.4 mL  Auto PEEP 0 cmH20  Total PEEP 5 cmH20  SpO2 98 %  Adult Ventilator Alarms  Alarms On Y  Ve High Alarm 21 L/min  Ve Low Alarm 4 L/min  Resp Rate High Alarm 38 br/min  Resp Rate Low Alarm 8  PEEP Low Alarm 3 cmH2O  Press High Alarm 40 cmH2O  T Apnea 20 sec(s)  Daily Weaning Assessment  Daily Assessment of Readiness to Wean Wean protocol criteria met (SBT performed)  SBT Method CPAP 5 cm H20 and PS 5 cm H20  Breath Sounds  Bilateral Breath Sounds Diminished

## 2021-12-06 NOTE — Consult Note (Signed)
Palliative Medicine Inpatient Consult Note  Consulting Provider: Marcy Panning  Reason for consult:   [place] Palliative Medicine Consult  Reason for Consult? family trying to decide if we extubate if he would want to be reintubated and would he have good quality of life    HPI:  Per intake H&P --> [name] is a 75 y.o. male with history of CABG x4, hyperlipidemia, prostate cancer, internal hemorrhoid, and a nonobstructive calculus in right interpole with hematuria presenting with acute onset vertigo nausea vomiting and an inability to walk.  At 10 AM on November 25, 2021 patient developed vertigo nausea and vomiting.  He also noted abdominal discomfort.  A code stroke was called on his arrival to triage.  Once the patient was in the CT scanner he developed more nausea and vomiting he went to a stat MRI which was positive for bilateral cerebellar strokes.  He then developed ataxia in his bilateral upper and lower extremities.  He was then given TNKase and went for a stat CTA of the head.  No LVO was shown on the CTA. Repeat MRI showed progression of edema related to the cerebellar infarcts with mass effect on the 4th ventricle. Neurosurgery consulted and hypertonic saline initiated. 12/13 suboccipital craniectomy overnight.  Patient tolerated procedure well.  Speech therapy recommends resuming nectar thickened liquids with his diet.  Repeat CT 12/14 shows hemorrhagic transformation of patient's stroke.  Lovenox resumed today as he is post 24 hours from hemorrhagic transformation of his stroke.Palliative care was consulted to discuss goals of care in the setting of recent stroke and aspiration event requiring re-intubation.  Clinical Assessment/Goals of Care:  *Please note that this is a verbal dictation therefore any spelling or grammatical errors are due to the "[place] One" system interpretation.  I have reviewed medical records including EPIC notes,  labs and imaging, received report from bedside RN, assessed the patient who is able to open his eyes and follow commands though is noted to be on precedex and fentanyl.    I met with [name] (spouse) to further discuss diagnosis prognosis, GOC, EOL wishes, disposition and options.  We reviewed that [name] is from the [facility] area and that they were here short term as [name] was officiating a wedding. This had gone quite well though when in the parking lot [name] stated that he was unable to move prompting hospitalization.    I introduced Palliative Medicine as specialized medical care for people living with serious illness. It focuses on providing relief from the symptoms and stress of a serious illness. The goal is to improve quality of life for both the patient and the family.  [name] lives in [place], [place]. He and his wife have been together > 35 years. They share one son together who is presently traveling in [place]. 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 [name] is identified as a man who loves life, he is the handler of "[employer]". [name] did a great amount of work in [employer] and with youth. He was quite the sportsman in his younger years. He is described as a person with a zest for life.   Prior to hospitalization, [name] was fully independent of all bADL's and iADL's though over the ast few years he had been slowing down a bit in the setting of old age.  A detailed discussion was had today regarding advanced directives -  [name] is not sure whether or not she has these for [name].    Concepts specific to code status, artifical feeding and hydration, continued IV antibiotics and rehospitalization was had. For the time being, [name] is full code per his wife as she is hopeful to give him opportunity for improvement. She shares that the input from the neurology team has been reassuring and that she herself has seen  improvement in [place] over the last twelve days.   [name] shares with me that right now she is being faced with the possible decision of when [name] gets extubated placing a tracheostomy or not if he should go into distress again. We reviewed that often when patients go into severe respiratory depression it is at that time that intubation is pursued and then tracheostomy thereafter. We reviewed that this is often a short term measure for ideally long term benefit so that patient may continue on their rehabilitation journey.   [name] expresses not wanting for [name] to be re-intubated if he goes into distress again. I shared that this does not quite align with a short term tracheostomy which it appears that she would be open to if [place] condition could improve. Reviewed that I would request the pulmonary team explain this again.   [name] expresses feeling confused and overwhelmed by all of the decisions before her. She shares for certain that [name] would not want to live a life whereby he is not functional. She expresses that he values his autonomy and losing that - being in limbo would not be an acceptable quality of life for him.  We reviewed some additional questions as they relate to intubation and aspirational events.  Discussed the importance of continued conversation with family and their  medical providers regarding overall plan of care and treatment options, ensuring decisions are within the context of the patients values and GOCs.  Provided  "Hard Choices for Loving People" booklet.  ______________________ Addendum:  I was joined at bedside by Tyler Ores, FNP and [name] of pulmonology. We reviewed in detail the idea of re-intubation and tracheostomy if it should come down to that.  [name] reaffirms that [name] would not want to live in a vegetative state. She shares that per prior conversations the goals had been for continued improvements. If a massive event were to occur such as  another large stroke then goals would not be for all efforts.  [name] explains the reasons for tracheostomy placement in instances such as [place] and again emphasizes that this would not completely omit the risk of aspiration.   [name] shares how hard it is to make decisions in a state of flux like this whereby we don't know how the clinical scenario will evolve. Provided support. I shared that if Cainen declines further after intervention we can always readdress these concerns. Reviewed that in trying to provide [place] with a chance for improvement we are not dedicating him to a situation where plans cannot change if his stability is further compromised.   Decision Maker: [name] (spouse) [phone]  SUMMARY OF RECOMMENDATIONS   Full Code / Full Scope of care for the time being  Patient would not wish to live in a persistent declined state of being  Re-intubate and tracheostomy if reversible event occurs such as aspiration  Plan for extubation later today  Ongoing conversations regarding G-Tube plant to see how patient progresses with speech  Will need acute rehabilitation when discharged  Ongoing Palliative Team support  Code Status/Advance Care  Planning: FULL CODE   Palliative Prophylaxis:  Oral Care, Mobility  Additional Recommendations (Limitations, Scope, Preferences): Continue current scope of care  Psycho-social/Spiritual:  Desire for further Chaplaincy support: No Additional Recommendations: Education on CVAs   Prognosis: Unclear  Discharge Planning: Discharge to acute care rehabilittion if continues to improve  Vitals:   12/06/21 1500 12/06/21 1600  BP: 108/68 (!) 140/95  Pulse: 81   Resp: (!) 24   Temp:    SpO2: 98%     Intake/Output Summary (Last 24 hours) at 12/06/2021 1625 Last data filed at 12/06/2021 1500 Gross per 24 hour  Intake 2324.06 ml  Output 1675 ml  Net 649.06 ml   Last Weight  Most recent update: 12/05/2021  4:17 AM     Weight  87.3 kg (192 lb 7.4 oz)             Gen:  Elderly Caucasian M in NAD HEENT: ETT, Coretrack, dry mucous membranes CV: Regular rate and irregular rhythm PULM: On mechanical ventilator ABD: soft/nontender EXT: Pedal edema Neuro: On sedation  PPS: 10%   This conversation/these recommendations were discussed with patient primary care team, [name] and [name]  Time In: 1210 Time Out: 1423 Total Time: 133 Greater than 50%  of this time was spent counseling and coordinating care related to the above assessment and plan.  [place] Team Team Cell Phone: [phone] Please utilize secure chat with additional questions, if there is no response within 30 minutes please call the above phone number  Palliative Medicine Team providers are available by phone from 7am to 7pm daily and can be reached through the team cell phone.  Should this patient require assistance outside of these hours, please call the patient's attending physician.

## 2021-12-07 DIAGNOSIS — I639 Cerebral infarction, unspecified: Secondary | ICD-10-CM | POA: Diagnosis not present

## 2021-12-07 DIAGNOSIS — Z515 Encounter for palliative care: Secondary | ICD-10-CM | POA: Diagnosis not present

## 2021-12-07 LAB — CBC
HCT: 43.9 % (ref 39.0–52.0)
Hemoglobin: 13.7 g/dL (ref 13.0–17.0)
MCH: 30 pg (ref 26.0–34.0)
MCHC: 31.2 g/dL (ref 30.0–36.0)
MCV: 96.1 fL (ref 80.0–100.0)
Platelets: UNDETERMINED 10*3/uL (ref 150–400)
RBC: 4.57 MIL/uL (ref 4.22–5.81)
RDW: 15.5 % (ref 11.5–15.5)
WBC: 9.5 10*3/uL (ref 4.0–10.5)
nRBC: 0 % (ref 0.0–0.2)

## 2021-12-07 LAB — BASIC METABOLIC PANEL
Anion gap: 7 (ref 5–15)
BUN: 22 mg/dL (ref 8–23)
CO2: 23 mmol/L (ref 22–32)
Calcium: 8 mg/dL — ABNORMAL LOW (ref 8.9–10.3)
Chloride: 126 mmol/L — ABNORMAL HIGH (ref 98–111)
Creatinine, Ser: 0.79 mg/dL (ref 0.61–1.24)
GFR, Estimated: >60 mL/min (ref >60)
Glucose, Bld: 113 mg/dL — ABNORMAL HIGH (ref 70–99)
Potassium: 4.9 mmol/L (ref 3.5–5.1)
Sodium: 156 mmol/L — ABNORMAL HIGH (ref 135–145)

## 2021-12-07 LAB — GLUCOSE, CAPILLARY
Glucose-Capillary: 100 mg/dL — ABNORMAL HIGH (ref 70–99)
Glucose-Capillary: 111 mg/dL — ABNORMAL HIGH (ref 70–99)
Glucose-Capillary: 112 mg/dL — ABNORMAL HIGH (ref 70–99)
Glucose-Capillary: 114 mg/dL — ABNORMAL HIGH (ref 70–99)
Glucose-Capillary: 125 mg/dL — ABNORMAL HIGH (ref 70–99)
Glucose-Capillary: 127 mg/dL — ABNORMAL HIGH (ref 70–99)

## 2021-12-07 LAB — CULTURE, RESPIRATORY W GRAM STAIN

## 2021-12-07 MED ORDER — CLONAZEPAM 0.25 MG PO TBDP
1.0000 mg | ORAL_TABLET | Freq: Two times a day (BID) | ORAL | Status: DC
Start: 2021-12-07 — End: 2021-12-07
  Administered 2021-12-07: 09:00:00 1 mg
  Filled 2021-12-07: qty 4

## 2021-12-07 MED ORDER — CARVEDILOL 12.5 MG PO TABS
12.5000 mg | ORAL_TABLET | Freq: Two times a day (BID) | ORAL | Status: DC
Start: 2021-12-07 — End: 2021-12-10
  Administered 2021-12-07 – 2021-12-09 (×6): 12.5 mg
  Filled 2021-12-07 (×6): qty 1

## 2021-12-07 MED ORDER — LOPERAMIDE HCL 1 MG/7.5ML PO SUSP
1.0000 mg | Freq: Once | ORAL | Status: AC | PRN
  Administered 2021-12-07: 1 mg
  Filled 2021-12-07: qty 7.5

## 2021-12-07 MED ORDER — FREE WATER
225.0000 mL | Status: DC
Start: 2021-12-07 — End: 2021-12-12
  Administered 2021-12-07 – 2021-12-12 (×28): 225 mL

## 2021-12-07 MED ORDER — CLONAZEPAM 0.25 MG PO TBDP
0.5000 mg | ORAL_TABLET | Freq: Two times a day (BID) | ORAL | Status: DC
  Administered 2021-12-07: 22:00:00 0.5 mg
  Filled 2021-12-07: qty 2

## 2021-12-07 MED ORDER — PSYLLIUM 95 % PO PACK: 1.0000 | PACK | Freq: Every day | ORAL | Status: DC

## 2021-12-07 MED ORDER — CARVEDILOL 12.5 MG PO TABS: 12.5000 mg | ORAL_TABLET | Freq: Two times a day (BID) | ORAL | Status: DC

## 2021-12-07 MED ORDER — LOPERAMIDE HCL 1 MG/7.5ML PO SUSP
1.0000 mg | Freq: Once | ORAL | Status: DC | PRN
  Filled 2021-12-07: qty 7.5

## 2021-12-07 NOTE — Progress Notes (Addendum)
Palliative Medicine Inpatient Follow Up Note  Consulting Provider: Mick Sell, PA-C   Reason for consult:   [place] Palliative Medicine Consult  Reason for Consult? family trying to decide if we extubate if he would want to be reintubated and would he have good quality of life    HPI:  Per intake H&P --> Mr. Tyliek Timberman is a 75 y.o. male with history of CABG x4, hyperlipidemia, prostate cancer, internal hemorrhoid, and a nonobstructive calculus in right interpole with hematuria presenting with acute onset vertigo nausea vomiting and an inability to walk.  At 10 AM on November 25, 2021 patient developed vertigo nausea and vomiting.  He also noted abdominal discomfort.  A code stroke was called on his arrival to triage.  Once the patient was in the CT scanner he developed more nausea and vomiting he went to a stat MRI which was positive for bilateral cerebellar strokes.  He then developed ataxia in his bilateral upper and lower extremities.  He was then given TNKase and went for a stat CTA of the head.  No LVO was shown on the CTA. Repeat MRI showed progression of edema related to the cerebellar infarcts with mass effect on the 4th ventricle. Neurosurgery consulted and hypertonic saline initiated. 12/13 suboccipital craniectomy overnight.  Patient tolerated procedure well.  Speech therapy recommends resuming nectar thickened liquids with his diet.  Repeat CT 12/14 shows hemorrhagic transformation of patient's stroke.  Lovenox resumed today as he is post 24 hours from hemorrhagic transformation of his stroke.Palliative care was consulted to discuss goals of care in the setting of recent stroke and aspiration event requiring re-intubation.  Today's Discussion (12/07/2021):  *Please note that this is a verbal dictation therefore any spelling or grammatical errors are due to the "[place] One" system interpretation.  Chart reviewed.  Woodard was extubated yesterday to  4 L nasal cannula.  As of this morning he appears to be tolerating that fairly well though he is agitated in the setting of having a NG tube in as well as mittens and restraints on.   I spoke to Jabreel spouse, [name] this morning.  We reviewed the plan for [place] as per conversation yesterday.  [name] shares with me that she did have the opportunity to speak to her son who lives in [place], [place].  He is planning a visit in the near future.  We reviewed that it is important that he get the opportunity to see his father as it is unclear which direction things will go.  Again, reviewed the hope for improvements and the knowledge that those may not occur the way that we would like for them to.  Discussed the maintenance of cautious optimism.  We reviewed that post hospitalization hopefully Akbar will be able to transition closer to the [facility] area though at this point situations are being taken 1 step at a time.  Questions and concerns addressed   Objective Assessment: Vital Signs Vitals:   12/07/21 1100 12/07/21 1200  BP: (!) 123/46 (!) 134/59  Pulse: 87 87  Resp: (!) 36 18  Temp:  100.1 F (37.8 C)  SpO2: 96% 99%    Intake/Output Summary (Last 24 hours) at 12/07/2021 1251 Last data filed at 12/07/2021 0800 Gross per 24 hour  Intake 2750.44 ml  Output 1925 ml  Net 825.44 ml   Last Weight  Most recent update: 12/07/2021  3:34 AM    Weight  87.1 kg (192 lb 0.3  oz)            Gen:  Elderly Caucasian M in NAD HEENT: Coretrack, dry mucous membranes CV: Regular rate and irregular rhythm PULM: On 4LPM [place] ABD: soft/nontender EXT: Pedal edema Neuro: On sedation - responsive  SUMMARY OF RECOMMENDATIONS   Full Code / Full Scope of care for the time being   Patient would not wish to live in a persistent declined state of being   Re-intubate and tracheostomy if reversible event occurs such as aspiration   Plan for extubation later today   Ongoing conversations regarding  G-Tube plant to see how patient progresses with speech   Will need acute rehabilitation when discharged   Ongoing Palliative Team support  Time Spent: 30 Greater than 50% of the time was spent in counseling and coordination of care ______________________________________________________________________________________ [place] Team Team Cell Phone: [phone] Please utilize secure chat with additional questions, if there is no response within 30 minutes please call the above phone number  Palliative Medicine Team providers are available by phone from 7am to 7pm daily and can be reached through the team cell phone.  Should this patient require assistance outside of these hours, please call the patient's attending physician.

## 2021-12-07 NOTE — Progress Notes (Signed)
Inpatient Rehabilitation Admissions Coordinator   Noted extubation.I continue to follow his progress at a distance, as wife's preference is for [facility] AIR if appropriate.  Danne Baxter, RN, MSN Rehab Admissions Coordinator [phone] 12/07/2021 11:16 AM

## 2021-12-07 NOTE — Progress Notes (Addendum)
STROKE TEAM PROGRESS NOTE   INTERVAL HISTORY Patient is seen in his room with his wife at the bedside.  He has been extubated, and agitation has been challenging to manage, with patient removing multiple tubes and lines overnight. Agitation has improved this morning with trial of clonazepam.  He has been hemodynamically stable.  Neurological exam is limited due to sedation.  Vitals:   12/07/21 0500 12/07/21 0600 12/07/21 0700 12/07/21 0800  BP: (!) 149/81 (!) 167/81 (!) 151/86 (!) 152/74  Pulse: (!) 104 100 (!) 119 (!) 112  Resp: (!) 29 (!) 22 (!) 38 (!) 40  Temp:    99 F (37.2 C)  TempSrc:    Axillary  SpO2: 96% 95% 94% 96%  Weight:      Height:       CBC:  Recent Labs  Lab 12/04/21 0526 12/04/21 2359 12/06/21 0118 12/06/21 1520 12/07/21 0332  WBC 7.2   < > 12.1*  --  9.5  NEUTROABS 5.4  --   --   --   --   HGB 15.0   < > 12.3* 10.9* 13.7  HCT 46.5   < > 39.3 32.0* 43.9  MCV 94.1   < > 97.5  --  96.1  PLT 207   < > 195  --  PLATELET CLUMPS NOTED ON SMEAR, UNABLE TO ESTIMATE   < > = values in this interval not displayed.    Basic Metabolic Panel:  Recent Labs  Lab 12/04/21 0523 12/04/21 2359 12/05/21 0505 12/05/21 1128 12/06/21 0118 12/06/21 0743 12/06/21 1521 12/07/21 0332  NA 160*   < > 159*   < > 156*   < > 158* 156*  K 3.3*   < > 3.7   < > 3.3*   < > 3.7 4.9  CL 130*  --  >130*   < > CRITICAL RESULT CALLED TO, READ BACK BY AND VERIFIED WITH:   < > 129* 126*  CO2 23  --  20*   < > 22   < > 24 23  GLUCOSE 117*  --  107*   < > 134*   < > 131* 113*  BUN 30*  --  30*   < > 37*   < > 27* 22  CREATININE 1.29*  --  1.16   < > 1.18   < > 0.95 0.79  CALCIUM 7.9*  --  7.4*   < > 7.5*   < > 8.0* 8.0*  MG 2.8*  --  2.7*  --  2.8*  --   --   --   PHOS 4.1  --  4.0  --   --   --   --   --    < > = values in this interval not displayed.    Lipid Panel:  Recent Labs  Lab 12/06/21 0118  TRIG 319*     HgbA1c:  No results for input(s): HGBA1C in the last 168  hours.  Urine Drug Screen:  No results for input(s): LABOPIA, COCAINSCRNUR, LABBENZ, AMPHETMU, THCU, LABBARB in the last 168 hours.   Alcohol Level No results for input(s): ETH in the last 168 hours.  IMAGING past 24 hours No results found.  PHYSICAL EXAM  Temp:  [98.2 F (36.8 C)-100.2 F (37.9 C)] 99 F (37.2 C) (12/23 0800) Pulse Rate:  [57-119] 112 (12/23 0800) Resp:  [20-40] 40 (12/23 0800) BP: (108-174)/(68-118) 152/74 (12/23 0800) SpO2:  [84 %-99 %] 96 % (12/23 0800) FiO2 (%):  [  40 %] 40 % (12/22 1448) Weight:  [87.1 kg] 87.1 kg (12/23 0334)  General - Well nourished, well developed elderly Caucasian male cardiovascular - Regular rhythm and rate. Respiratory- respirations even and unlabored  NEURO:  Mental Status: Patient is very drowsy but will awaken to touch and state his name.  He is unable to follow other commands at this time.  He will move all extremities in response to noxious stimuli.   ASSESSMENT/PLAN [name] is a 75 y.o. male with history of CABG x4, hyperlipidemia, prostate cancer, internal hemorrhoid, and a nonobstructive calculus in right interpole with hematuria presenting with acute onset vertigo nausea vomiting and an inability to walk.  At 10 AM on November 25, 2021 patient developed vertigo nausea and vomiting.  He also noted abdominal discomfort.  A code stroke was called on his arrival to triage.  Once the patient was in the CT scanner he developed more nausea and vomiting he went to a stat MRI which was positive for bilateral cerebellar strokes.  He then developed ataxia in his bilateral upper and lower extremities.  He was then given TNKase and went for a stat CTA of the head.  No LVO was shown on the CTA. Repeat MRI showed progression of edema related to the cerebellar infarcts with mass effect on the 4th ventricle. Neurosurgery consulted and hypertonic saline initiated. 12/13 suboccipital craniectomy overnight.  Patient tolerated procedure  well.  Speech therapy recommends resuming nectar thickened liquids with his diet.  Repeat CT 12/14 shows hemorrhagic transformation of patient's stroke.  Lovenox resumed as he is post 24 hours from hemorrhagic transformation of his stroke.  Patient's agitation continues to be challenging to manage, although trial of clonazepam today has brought some improvement.  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 ASPECTS 10.    Repeat CT-extensive swelling in the areas of the acute cerebellar infarction more extensive on the right than the left.  Mass-effect on the fourth ventricle.  No evidence of ventricular obstruction.  Stable lateral and third ventricles. Post craniectomy CT-Unchanged right greater than left cerebellar infarcts.  No new infarction.  Unchanged narrowing of the fourth ventricle.  No evidence of ventricular obstruction or hydrocephalus. Repeat CT 12/14 New hyperdensity in right cerebellar infarct, likely hemorrhage with no increased mass effect. CTA head & neck unremarkable MRI  acute infarct of the cerebellum bilaterally.  negative for hemorrhage.  MRI Repeat 12/12- Interval progression of edema associated to bilateral cerebellar acute infarcts with mass effect on the fourth ventricle and low lying right cerebellar tonsil. No hydrocephalus CT head 12/14 -right cerebellum hemorrhagic transformation without hydrocephalus CT repeat stable postoperative changes involving infarct resolving intraventricular blood.  No acute abnormalities 2D Echo EF 70-75% LDL 106 HgbA1c 5.8 VTE prophylaxis - SCDs aspirin 81 mg daily prior to admission, now on aspirin 81, plavix discontinued due to hemorrhagic transformation of cerebellar stroke. Will consider DOAC in 14 days post stroke if neuro and CT stable Therapy recommendations: Inpatient rehab- [facility] rehab closer to  patients home Disposition: Pending  Cerebellar Edema s/p suboccipital decompression  3% normal saline initiated on 12/12 Sodium goal is 150-155 Na 156 S/p suboccipital decompression with Dr. Zada Finders 3% d/c'd and then resumed due to sodium levels Close neuro monitoring CT 12/13 reveals unchanged infarcts without evidence of hydrocephalus CT head 12/14 - right cerebellum hemorrhagic transformation without hydrocephalus CT head 12/17- Stable cerebellar infarct  with hemorrhage.  No hydrocephalus or interval change  3% normal saline off   Hypertension Home meds: None Stable on the high end BP <160  PRN hydralazine  Atrial flutter - new diagnosis Cardiology on board As needed metoprolol for rate control Consider DOAC in 14 days post stroke if neuro and CT stable  Fever Leukocytosis T-max 101.5->101.3-> 99.3->101.9 WBC 13.3->13.6->10.0->9.9 CXR 12/13 - Left lower lobe consolidation with small adjacent left pleural effusion. Findings are likely either due to pneumonia or aspiration. On unasyn   Hyperlipidemia Home meds: Atorvastatin 80 mg, resumed in hospital LDL 106, goal < 70 Atorvastatin 80 mg daily Continue statin at discharge  Delirium/agitation Encephalopathy Sundowning On precedex low dose, taper off as able Added clonidine yesterday Clonazepam 1mg  BID today. Seroquel stopped due to concern for NMS. Could be due to fever/aspiration pneumonia Repeat CT 12/14 reveals no hydrocephalus Emphasize sleep hygiene  Dysphagia NPO for now MBSS done 12/16 [place] in place. Patient is only able to have sparing thickened liquids currently Will start TF and meds via cortrak  Aspiration Pneumonia : Intubated 12/04/2021, extubated 12/22.  Started on vancomycin and ceftriaxone. Managed by ICU.  Other Stroke Risk Factors Advanced Age >/= 78  Obesity, Body mass index is 27.55 kg/m., BMI >/= 30 associated with increased.  Use of oral blood pressure medications.  Risk, recommend  weight loss, diet and exercise as appropriate  Coronary artery disease s/p CABG  Other Active Problems Abdominal discomfort CTA abdomen pelvis-no evidence of active GI bleed.  Moderate aortic atherosclerosis.  Diverticulosis with no evidence of diverticulitis. Internal hemorrhoid Nonobstructive calculus in right interpole Hematuria one month ago Flomax DC'd and changed to Urecholine 10 mg 3 times daily Respiratory distress and low oxygen sats-presumed aspiration pneumonia intubated 12/04/2021, now extubated   Hospital day # 12  Patient seen and examined by NP/APP with MD. MD to update note as needed.   [place] , MSN, AGACNP-BC [facility] See Amion for schedule and pager information 12/07/2021 10:23 AM  ATTENDING ATTESTATION:  Pt with b/l cerebellar CVAs s/p brainstem decompression. Getting agitated. Concern for NMS on Seroquel per ICU, stopped.  Agree with adding clonazepam 1mg  for now. Appreciate ICU help. Clonidine added for BP controlled.  His agitated much improved when I came back to re evaluate him in afternoon.  Dr. Reeves Forth evaluated pt independently, reviewed imaging, chart, labs. Discussed and formulated plan with the APP. Please see APP note above for details.   This patient is critically ill due to respiratory distress, stroke s/p tPA and at significant risk of neurological worsening, death form heart failure, respiratory failure, recurrent stroke, bleeding from [facility], seizure, sepsis. This patient's care requires constant monitoring of vital signs, hemodynamics, respiratory and cardiac monitoring, review of multiple databases, neurological assessment, discussion with family, other specialists and medical decision making of high complexity. I spent 35 minutes of neurocritical care time in the care of this patient.   Tyler Corkery,MD     To contact Stroke Continuity provider, please refer to [email or web address] After hours, contact General Neurology

## 2021-12-07 NOTE — Progress Notes (Signed)
SLP Cancellation Note  Patient Details Name: [name] MRN: [id number] DOB: [birth year]   Cancelled treatment:       Reason Eval/Treat Not Completed: Fatigue/lethargy limiting ability to participate (Pt's RN reported that the pt was agitated this morning, received Klonopin, and is now asleep. SLP will follow up as schedule allows.)  [name], [name], [name] Office number [phone] Pager [place] 12/07/2021, 10:56 AM

## 2021-12-07 NOTE — Progress Notes (Signed)
[place] Progress Note Patient Name: [name] DOB: [birth year] MRN: [id number]   Date of Service  12/07/2021  HPI/Events of Note  Received request for restraints Patient seen confused and restless and a risk for self harm by pulling lines and tubes   eICU Interventions  Bilateral soft wrist restraints ordered Bedside team to assess in am if restraints to be continued      Intervention Category Minor Interventions: Agitation / anxiety - evaluation and management  Judd Lien 12/07/2021, 9:11 PM

## 2021-12-07 NOTE — Progress Notes (Signed)
Occupational Therapy Treatment Patient Details Name: [name] MRN: [id number] DOB: 09/06/1946 Today's Date: 12/07/2021   History of present illness 75 yo male presenting to ED on 12/11 with ataxia and vertigo. 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 TNK given at 1214. S/p suboccipital crani for evacuation on 12/13. Required intubation 12/20-12/22. PMH including CAD s/p CABG x4, HLD, OA, nephrolithiasis, urinary retention, BPH with LUTS and prostate cancer.   OT comments  Pt with recent extubation on 12/22 and continues to be fatigued. Pt performing bed mobility with Max A +2 and sitting at EOB with Max A for support. Pt performing sit<>stand x2 with Max A +2. Increased fatigue and lethargy this session; suspected due to medication this AM. VSS. Continue to recommend dc to AIR and will continue to follow acutely as admitted.   Recommendations for follow up therapy are one component of a multi-disciplinary discharge planning process, led by the attending physician.  Recommendations may be updated based on patient status, additional functional criteria and insurance authorization.    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 Max A +2 for return to supine    Transfers Overall transfer level: Needs assistance Equipment used: 2 person hand held assist Transfers: Sit to/from Stand Sit to Stand: Max assist;+2 physical assistance;From elevated surface           General transfer comment: Max A +2 for power up and blocking of bil knees. Requiring assistance to maintain semi standing posture. Very fatigued and attempting to sit agyer 10 sec. Standing a second time to change linens     Balance Overall balance assessment: Needs assistance Sitting-balance support: No upper extremity supported;Feet supported Sitting balance-Leahy Scale: Poor     Standing balance support: Bilateral upper extremity supported;During functional activity Standing balance-Leahy Scale: Zero                             ADL either performed or assessed with clinical judgement   ADL Overall ADL's : Needs assistance/impaired                                       General ADL Comments: Total A for ADLs - lethargic    Extremity/Trunk Assessment Upper Extremity Assessment Upper Extremity Assessment: Difficult to assess due to impaired cognition RUE Deficits / Details: Decreased coordination and active use of R UE.  Weakness. RUE Sensation: WNL RUE Coordination: decreased fine motor;decreased gross motor LUE Deficits / Details: Decreased coordination. Difficulty performing finger to nose test. LUE Coordination: decreased fine motor;decreased gross motor   Lower Extremity Assessment Lower Extremity  Assessment: Defer to PT evaluation RLE Deficits / Details: grossly 4/5 RLE Coordination: decreased gross motor (ataxic movement)        Vision   Vision Assessment?: Yes Eye Alignment: Impaired (comment) Ocular Range of Motion: Impaired-to be further tested in functional  context;Restricted on the right Alignment/Gaze Preference: Other (comment) (unable to track passed midline to R) Tracking/Visual Pursuits: Decreased smoothness of eye movement to RIGHT inferior field;Decreased smoothness of eye movement to RIGHT superior field;Other (comment) (lateral L pulsating with gazing to L) Convergence: Impaired (comment) Visual Fields: Other (comment) (no noted fieldcut with peripheral vision testing) Additional Comments: Will continue to assess   Perception     Praxis      Cognition Arousal/Alertness: Lethargic;Suspect due to medications Behavior During Therapy: Flat affect Overall Cognitive Status: Difficult to assess                                 General Comments: lethargic and very sleepy. Keeping eyes closed untill at EOB. smiling at therapist. Following simpel commands for kicking at LLE.          Exercises     Shoulder Instructions       General Comments VSS    Pertinent Vitals/ Pain       Pain Assessment: Faces Faces Pain Scale: Hurts a little bit Facial Expression: Relaxed, neutral Body Movements: Absence of movements Muscle Tension: Relaxed Compliance with ventilator (intubated pts.): N/A Vocalization (extubated pts.): Talking in normal tone or no sound CPOT Total: 0 Pain Location: Generalized;grimacing Pain Descriptors / Indicators: Discomfort;Grimacing;Pressure Pain Intervention(s): Monitored during session;Limited activity within patient's tolerance;Repositioned  Home Living                                          Prior Functioning/Environment              Frequency  Min 2X/week        Progress Toward Goals  OT Goals(current goals can now be found in the care plan section)  Progress towards OT goals: Progressing toward goals  Acute Rehab OT Goals OT Goal Formulation: With family Time For Goal Achievement: 12/10/21 Potential to Achieve Goals: Good ADL Goals Pt Will Perform  Grooming: with set-up;with supervision;sitting Pt Will Perform Lower Body Dressing: with min assist;sit to/from stand Pt Will Transfer to Toilet: with min assist;stand pivot transfer;bedside commode Pt Will Perform Toileting - Clothing Manipulation and hygiene: with min guard assist;sitting/lateral leans  Plan Discharge plan remains appropriate    Co-evaluation    PT/OT/SLP Co-Evaluation/Treatment: Yes Reason for Co-Treatment: Complexity of the patient's impairments (multi-system involvement);For patient/therapist safety;To address functional/ADL transfers;Necessary to address cognition/behavior during functional activity PT goals addressed during session: Mobility/safety with mobility;Balance;Strengthening/ROM OT goals addressed during session: ADL's and self-care      AM-PAC OT "6 Clicks" Daily Activity     Outcome Measure   Help from another person eating meals?: A Lot Help from another person taking care of personal grooming?: A Lot Help from another person toileting, which includes using toliet, bedpan, or urinal?: A Lot Help from another person bathing (including washing, rinsing, drying)?: A Lot Help from another person to put on and taking off regular upper body clothing?: A Lot Help from another person to put on and taking off regular lower body clothing?: A Lot 6 Click Score: 12  End of Session Equipment Utilized During Treatment: Gait belt;Oxygen  OT Visit Diagnosis: Unsteadiness on feet (R26.81);Other abnormalities of gait and mobility (R26.89);Muscle weakness (generalized) (M62.81);History of falling (Z91.81)   Activity Tolerance Patient tolerated treatment well   Patient Left in bed;with call bell/phone within reach;with family/visitor present;with bed alarm set;with restraints reapplied   Nurse Communication Mobility status        Time: 1959-7471 OT Time Calculation (min): 31 min  Charges: OT General Charges $OT Visit: 1 Visit OT Treatments $Therapeutic  Activity: 8-22 mins  [place], OTR/L Acute Rehab Pager: [phone] Office: [place] 12/07/2021, 11:43 AM

## 2021-12-07 NOTE — Progress Notes (Signed)
Physical Therapy Treatment Patient Details Name: [name] MRN: [id number] DOB: 1946/02/15 Today's Date: 12/07/2021   History of Present Illness 75 yo male presenting to ED on 12/11 with ataxia and vertigo. 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 TNK given at 1214. S/p suboccipital crani for evacuation on 12/13. Required intubation 12/20-12/22. PMH including CAD s/p CABG x4, HLD, OA, nephrolithiasis, urinary retention, BPH with LUTS and prostate cancer.    PT Comments    Pt received in bed on 4L O2, lethargic due to receiving Klonopin this morning. He required +2 mod assist rolling, +2 max assist supine <> sit, and +2 max assist sit to stand. Standing trials x 3 with pt maintaining flexed posture and only sustaining stance a few seconds. While sitting EOB, pt able to initiate LLE LAQ on command, requiring assist to complete. Pt returned to supine at end of session. Session limited by lethargy.    Recommendations for follow up therapy are one component of a multi-disciplinary discharge planning process, led by the attending physician.  Recommendations may be updated based on patient status, additional functional criteria and insurance authorization.  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 Sit to stand x 3 trials. Only able to sustain stance a few seconds/trial, maintaining flexed posture.    Ambulation/Gait                   Stairs             Wheelchair Mobility    Modified Rankin (Stroke Patients Only) Modified Rankin (Stroke Patients Only) Pre-Morbid Rankin Score: No symptoms Modified Rankin: Severe disability     Balance Overall balance assessment: Needs assistance Sitting-balance support: No upper extremity supported;Feet supported Sitting balance-Leahy Scale: Poor   Postural control: Posterior lean Standing balance support: Bilateral upper extremity supported;During functional activity Standing balance-Leahy Scale: Zero                              Cognition Arousal/Alertness: Lethargic;Suspect due to medications Behavior During Therapy: Flat affect Overall Cognitive Status: Difficult to assess                                 General Comments: Agitation and delerium this morning requiring Klonopin. Pt very lethargic/sleepy at time of PT/OT session.  Exercises      General Comments General comments (skin integrity, edema, etc.): VSS on 4L      Pertinent Vitals/Pain Pain Assessment: Faces Faces Pain Scale: Hurts a little bit Facial Expression: Relaxed, neutral Body Movements: Absence of movements Muscle Tension: Relaxed Compliance with ventilator (intubated pts.): N/A Vocalization (extubated pts.): Talking in normal tone or no  sound CPOT Total: 0 Pain Location: Generalized;grimacing Pain Descriptors / Indicators: Grimacing;Discomfort Pain Intervention(s): Monitored during session;Repositioned    Home Living                          Prior Function            PT Goals (current goals can now be found in the care plan section) Acute Rehab PT Goals Patient Stated Goal: not stated Progress towards PT goals: Progressing toward goals    Frequency    Min 4X/week      PT Plan Current plan remains appropriate    Co-evaluation PT/OT/SLP Co-Evaluation/Treatment: Yes Reason for Co-Treatment: Complexity of the patient's impairments (multi-system involvement);For patient/therapist safety;To address functional/ADL transfers;Necessary to address cognition/behavior during functional activity PT goals addressed during session: Mobility/safety with mobility;Balance;Strengthening/ROM OT goals addressed during session: ADL's and self-care      AM-PAC PT "6 Clicks" Mobility   Outcome Measure  Help needed turning from your back to your side while in a flat bed without using bedrails?: A Lot Help needed moving from lying on your back to sitting on the side of a flat bed without using bedrails?: Total Help needed moving to and from a bed to a chair (including a wheelchair)?: Total Help needed standing up from a chair using your arms (e.g., wheelchair or bedside chair)?: Total Help needed to walk in hospital room?: Total Help needed climbing 3-5 steps with a railing? : Total 6 Click Score: 7    End of Session Equipment Utilized During Treatment: Gait belt;Oxygen Activity Tolerance: Patient limited by lethargy Patient left: in bed;with call bell/phone within reach;with bed alarm set;with family/visitor present Nurse Communication: Mobility status PT Visit Diagnosis: Unsteadiness on feet (R26.81);Difficulty in walking, not elsewhere classified (R26.2);Ataxic gait (R26.0)     Time: 9379-0240 PT Time  Calculation (min) (ACUTE ONLY): 31 min  Charges:  $Therapeutic Activity: 8-22 mins                     [name], PT  Office # [phone] Pager [phone]    Lorriane Shire 12/07/2021, 11:58 AM

## 2021-12-07 NOTE — Progress Notes (Signed)
[place] Progress Note Patient Name: [name] DOB: 1946-11-04 MRN: [id number]   Date of Service  12/07/2021  HPI/Events of Note  Request for imodium received Loose stools reportedly from tube feeding. Already on high fiber Jevity  eICU Interventions  One time dose of Imodium ordered     Intervention Category Minor Interventions: Routine modifications to care plan (e.g. PRN medications for pain, fever)  Shona Needles Silvie Obremski 12/07/2021, 10:46 PM

## 2021-12-07 NOTE — Progress Notes (Signed)
NAME:  [name], MRN:  [id number], DOB:  02-Apr-1946, LOS: 12 ADMISSION DATE:  11/25/2021, CONSULTATION DATE:  12/13 REFERRING MD:  Marcelle Overlie, CHIEF COMPLAINT:  Stroke symptoms   History of Present Illness:  75 y/o male admitted with a cerebellar stroke on 12/11, received TNKase, he developed increasing mass effect on the 4th ventricle, moved to the ICU. 12/13 he had a suboccipital craniectomy.  Repeat CT 12/14 showed hemorrhagic conversion.  Pertinent  Medical History  CAD, s/p CABG  HLD  OA,  Nephrolithiasis BPH  Prostate cancer   Interim History / Subjective:  Extubated yesterday On [place] WBP 140-167 range Patient following commands; still has delirium; pulled IV's and flexiseal out over night; remains on precedex drip   Objective   Blood pressure (!) 167/81, pulse 100, temperature 98.5 F (36.9 C), temperature source Axillary, resp. rate (!) 22, height 5\' 10"  (1.778 m), weight 87.1 kg, SpO2 95 %.    Vent Mode: PSV;CPAP FiO2 (%):  [40 %] 40 % Set Rate:  [20 bmp] 20 bmp Vt Set:  [580 mL] 580 mL PEEP:  [5 cmH20] 5 cmH20 Pressure Support:  [5 cmH20-10 cmH20] 5 cmH20 Plateau Pressure:  [20 cmH20] 20 cmH20   Intake/Output Summary (Last 24 hours) at 12/07/2021 3300 Last data filed at 12/07/2021 0600 Gross per 24 hour  Intake 2979.21 ml  Output 1725 ml  Net 1254.21 ml    Filed Weights   11/25/21 1200 12/05/21 0416 12/07/21 0334  Weight: 98.1 kg 87.3 kg 87.1 kg    Examination: General: ill appearing on Bent HEENT: MM pink/moist; Hailey and cor trak in place Neuro: MAE; PERRL; following commands; delrium present CV: s1s2, tachy; no m/r/g PULM:  dim rhonchi BS bilaterally; on Simms GI: soft, bsx4 active  Extremities: warm/dry, no edema  Skin: no rashes or lesions    Na 156 WBC 9.5    Resolved problem list: Shock: post intubation and sedation  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 and compression of ventral pons s/p posterior  decompression with craniectomy Hemorrhagic conversion of R cerebellar infarct P: -Stroke following -PT/OT/SLP -avoid fevers -CBG monitoring for goal <180 -continue ASA; hold plavix due to hemorrhagic conversion -Will need rehab post hospital, looking at [facility] inpatient rehab which is close to home  Acute respiratory failure w/ hypoxia Possible aspiration pneumonia Right lower lobe pneumonia - s/p 7 days Unasyn P: -patient extubated yesterday -continue [place]; wean for sats >92% -pulm toiletry: IS and flutter -resp cultures pending; continue rocephin x7days  Dysphagia  - Modified barium swallow showing aspiration, now s/p P: -continue cortrak -continue tube feeds -SLP  Fever: improving Leukocytosis: improving; possible aspiration pneumonia P: -continue ceftriaxone x 7days -follow respiratory cultures -trend fever/wbc curve -follow bcx2  Chronic HFpEF Hypertension, uncontrolled CAD P: -clonidine added yesterday -will add carvedilol -prn hydralazine  Induced hypernatremia - hypertonic now off Hypokalemia Hypocalcemia P: -trend bmp/mag and replete as needed  Hyperactive delirium P: -started on home zanaflex -continue precedex; wean as able -continue clonidine  -will consider adding back antipsychotic such as seroquel although there was a concern of NMS  Proximal atrial flutter P: -cardizem started today -consider adding back diltiazem  BPH P: -Continue Flomax -trend uop  GOC P: -12/22: palliative consulted; wife decided that if his respiratory status worsens after extubation she would be okay with reintubation and trach if necessary.    Best Practice (right click and "Reselect all SmartList Selections" daily)   Diet/type: NPO tube feeds DVT  prophylaxis: SCD GI prophylaxis: PPI Lines: N/A Foley:  N/A Code Status:  full code Last date of multidisciplinary goals of care discussion [12/18: OK with CPR and short term intubation but would not want prolonged  life support.  Would reconsider need for mechanical ventilation if no improvement after 5-7 days.]  Critical care time: 35 minutes   JD Rexene Agent [facility] 12/07/2021, 7:13 AM  Please see [email or web address] for pager details.  From 7A-7P if no response, please call [phone]. After hours, please call ELink [phone].

## 2021-12-08 ENCOUNTER — Inpatient Hospital Stay (HOSPITAL_COMMUNITY): Payer: Medicare Other

## 2021-12-08 DIAGNOSIS — I639 Cerebral infarction, unspecified: Secondary | ICD-10-CM | POA: Diagnosis not present

## 2021-12-08 DIAGNOSIS — Z515 Encounter for palliative care: Secondary | ICD-10-CM | POA: Diagnosis not present

## 2021-12-08 LAB — CBC
HCT: 43.6 % (ref 39.0–52.0)
Hemoglobin: 14.2 g/dL (ref 13.0–17.0)
MCH: 30.6 pg (ref 26.0–34.0)
MCHC: 32.6 g/dL (ref 30.0–36.0)
MCV: 94 fL (ref 80.0–100.0)
Platelets: 193 10*3/uL (ref 150–400)
RBC: 4.64 MIL/uL (ref 4.22–5.81)
RDW: 14.6 % (ref 11.5–15.5)
WBC: 8.2 10*3/uL (ref 4.0–10.5)
nRBC: 0 % (ref 0.0–0.2)

## 2021-12-08 LAB — BASIC METABOLIC PANEL
Anion gap: 9 (ref 5–15)
BUN: 23 mg/dL (ref 8–23)
CO2: 24 mmol/L (ref 22–32)
Calcium: 8.2 mg/dL — ABNORMAL LOW (ref 8.9–10.3)
Chloride: 118 mmol/L — ABNORMAL HIGH (ref 98–111)
Creatinine, Ser: 0.77 mg/dL (ref 0.61–1.24)
GFR, Estimated: >60 mL/min (ref >60)
Glucose, Bld: 157 mg/dL — ABNORMAL HIGH (ref 70–99)
Potassium: 4.3 mmol/L (ref 3.5–5.1)
Sodium: 151 mmol/L — ABNORMAL HIGH (ref 135–145)

## 2021-12-08 LAB — CULTURE, RESPIRATORY W GRAM STAIN

## 2021-12-08 LAB — GLUCOSE, CAPILLARY
Glucose-Capillary: 152 mg/dL — ABNORMAL HIGH (ref 70–99)
Glucose-Capillary: 144 mg/dL — ABNORMAL HIGH (ref 70–99)
Glucose-Capillary: 149 mg/dL — ABNORMAL HIGH (ref 70–99)
Glucose-Capillary: 92 mg/dL (ref 70–99)

## 2021-12-08 IMAGING — DX DG ABD PORTABLE 1V
1 series · 1 of 1 positions shown · non-contrast
Comparison: Radiograph dated [DATE].

CLINICAL DATA: NG placement

EXAM:
PORTABLE ABDOMEN - 1 VIEW

[abdomen supine]
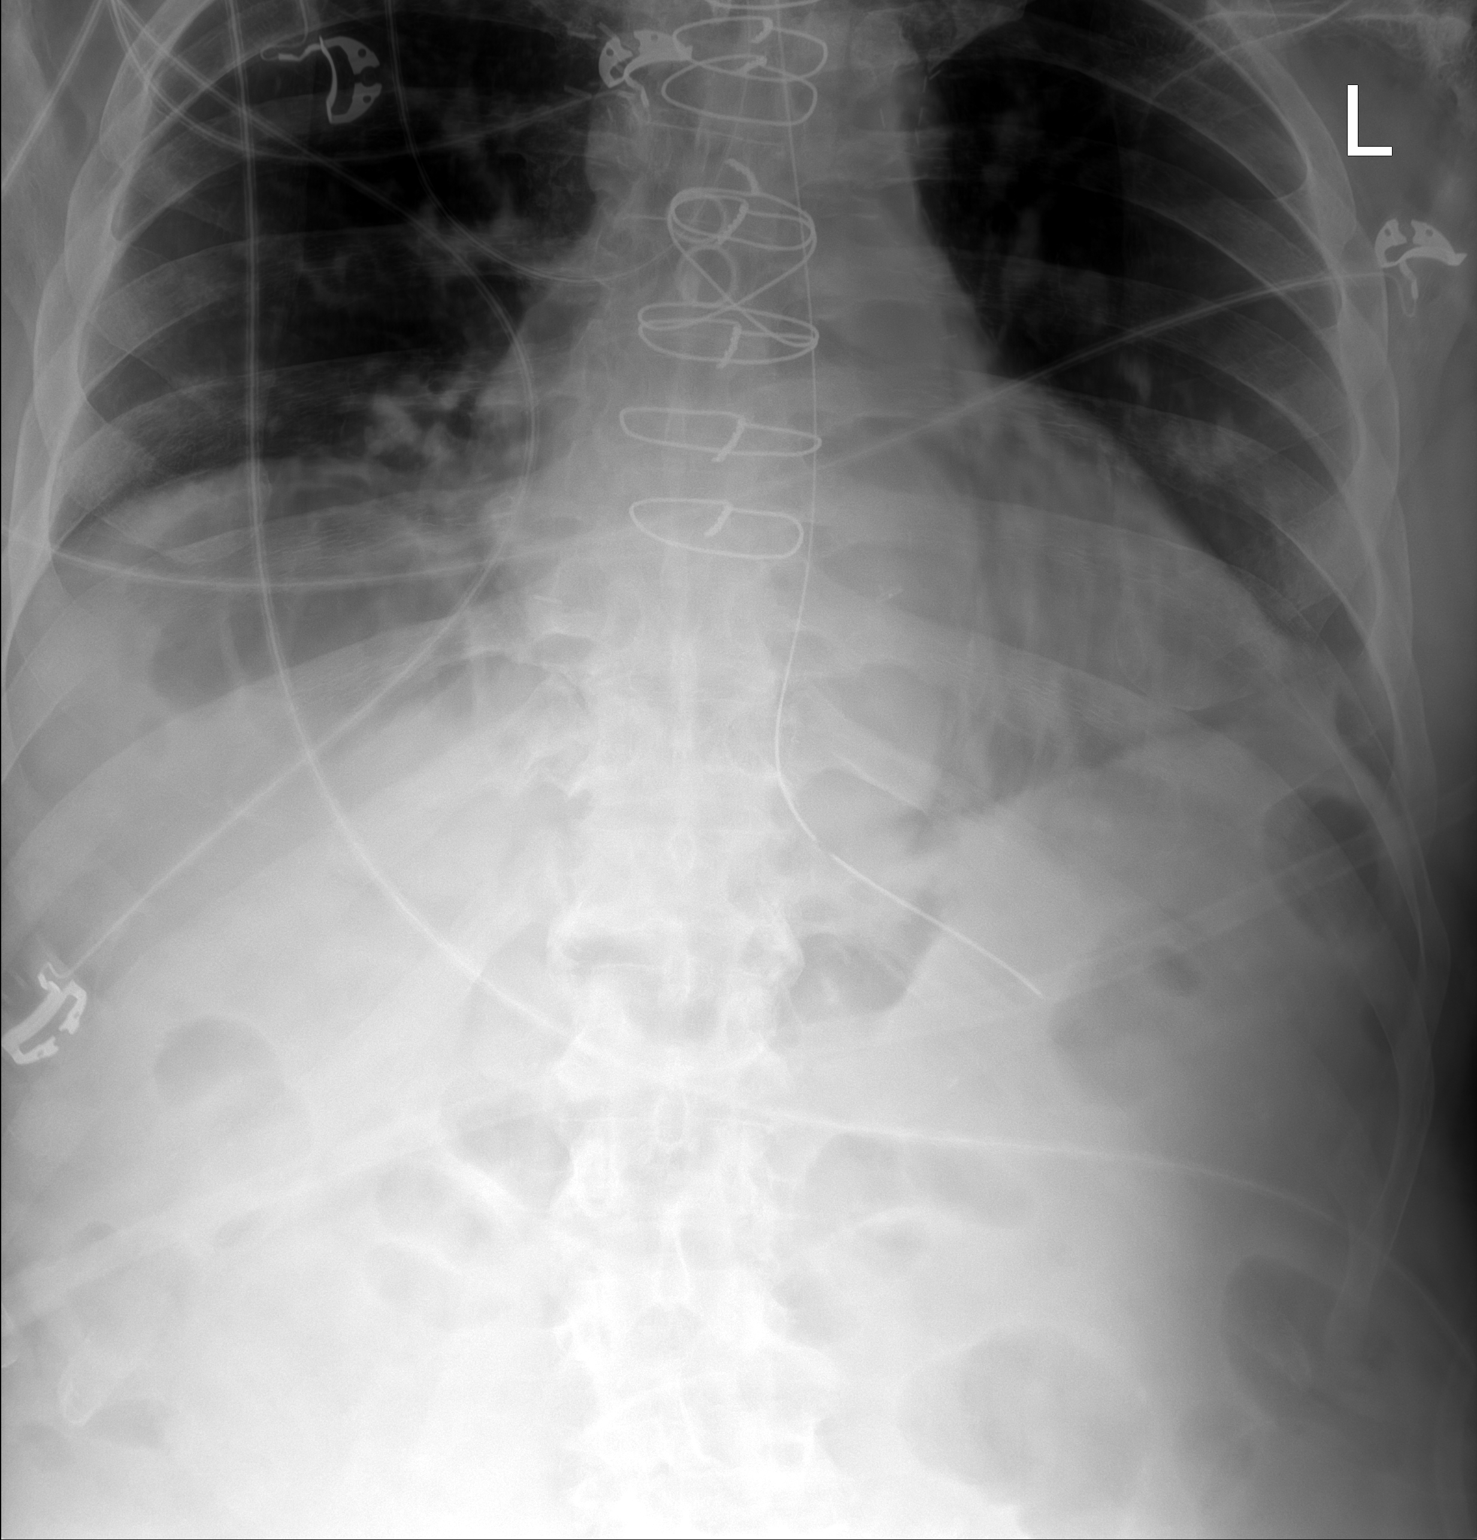

[1 of 1 positions shown; findings below may reference images not displayed]

FINDINGS: Partially visualized enteric tube with side-port in the epigastric
area and tip in the proximal stomach.

Bibasilar atelectasis or infiltrate. Cardiomegaly. Median sternotomy
wires.
IMPRESSION: Partially visualized enteric tube with tip in the proximal stomach.

## 2021-12-08 IMAGING — CT CT HEAD W/O CM
4 of 5 series · 16 of 47 positions shown, 17 images · non-contrast
Comparison: [DATE]

CLINICAL DATA: Follow-up stroke.  Right cerebellar stroke.

EXAM:
CT HEAD WITHOUT CONTRAST
TECHNIQUE: Contiguous axial images were obtained from the base of the skull
through the vertex without intravenous contrast.

[Series 2: head wo · axial · 0.37mm/px · z∈[-96,-20]mm · 3 of 32 slices shown, 4 images]
[im 8/32  brain]
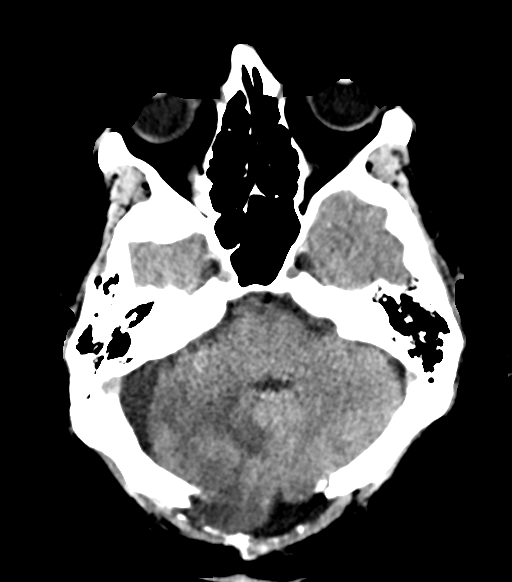
[im 8/32  bone]
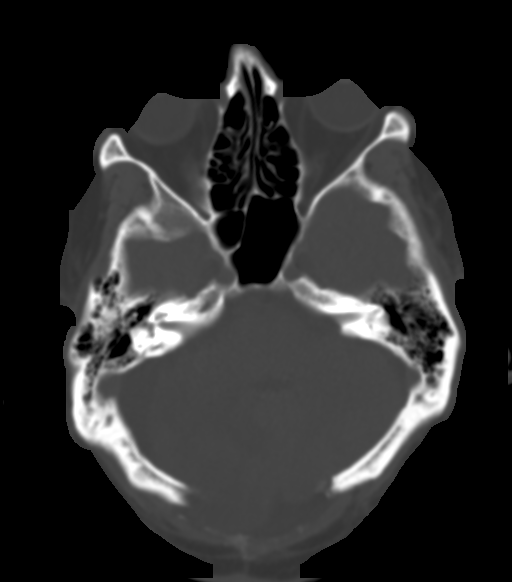
[im 16/32  brain]
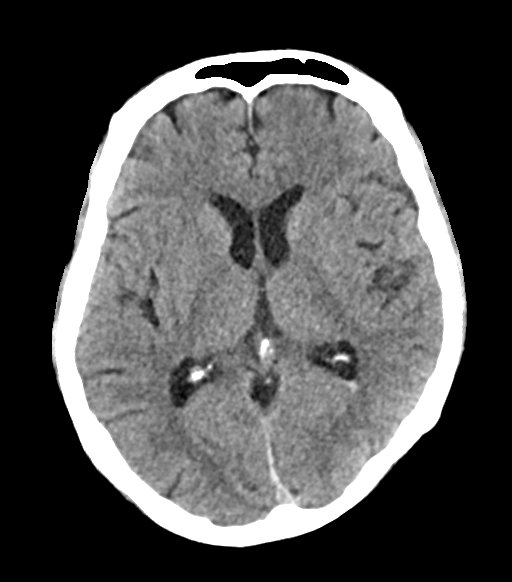
[im 24/32  brain]
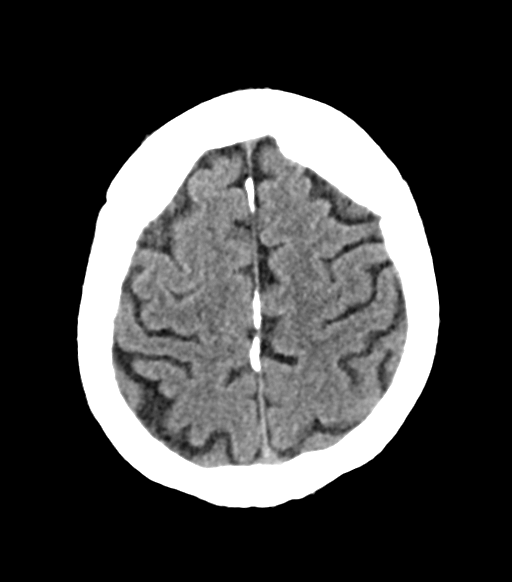

[Series 3: head bone · axial · 0.39mm/px · z∈[-160,-52]mm · 7 of 82 slices shown]
[im 7/82  bone]
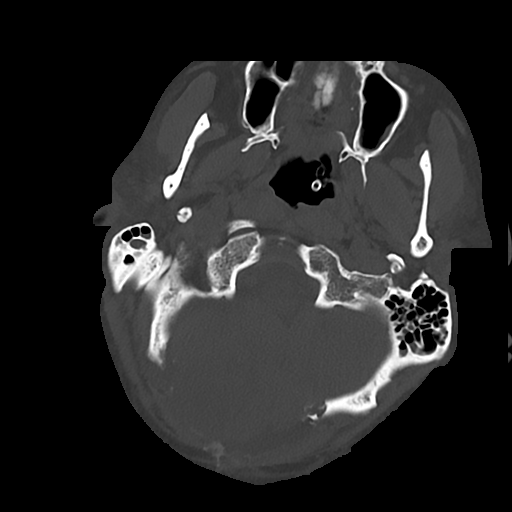
[im 21/82  bone]
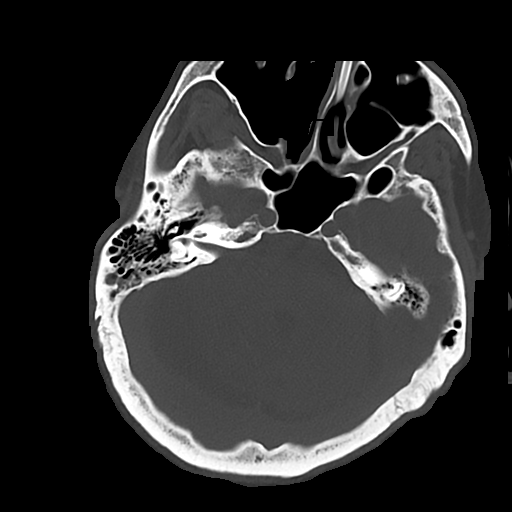
[im 28/82  bone]
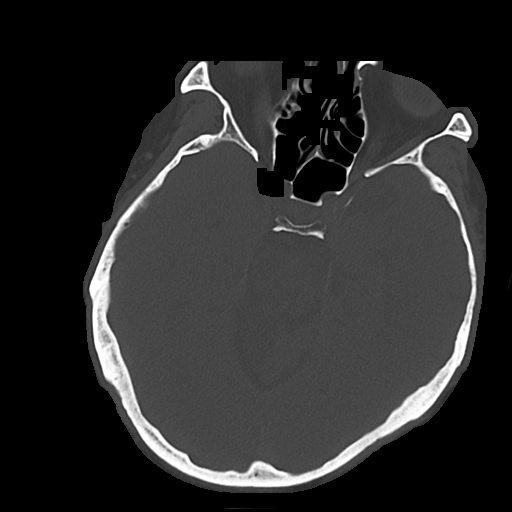
[im 34/82  bone]
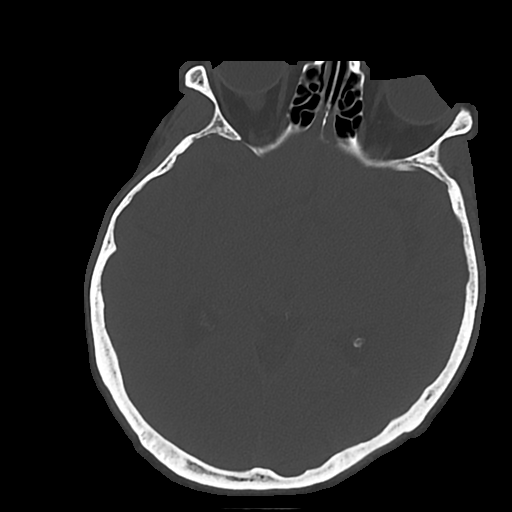
[im 48/82  bone]
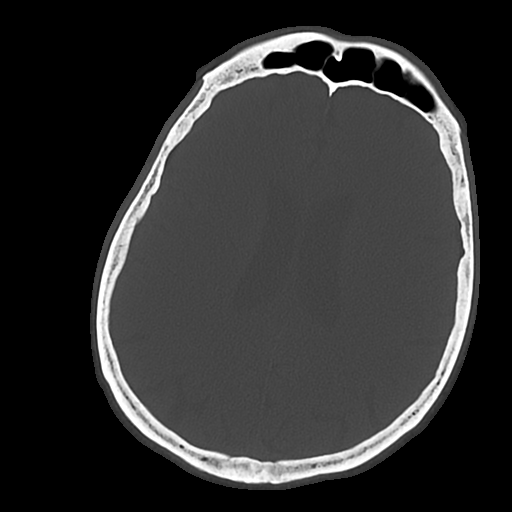
[im 55/82  bone]
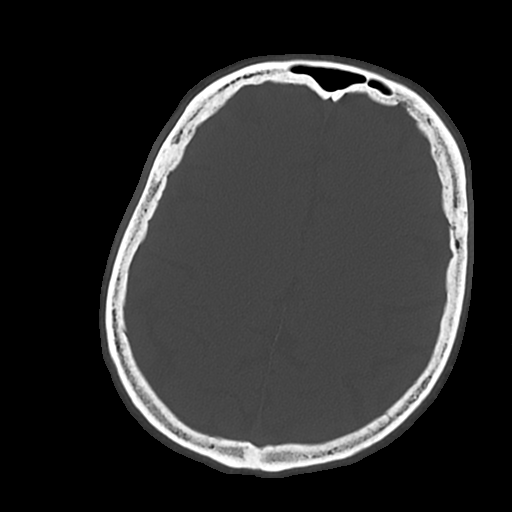
[im 61/82  bone]
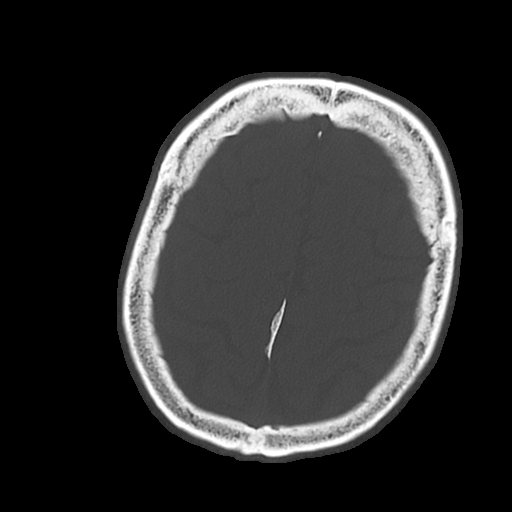

[Series 5: cor soft · coronal · 0.31mm/px · 3 of 72 slices shown]
[im 24/72  brain]
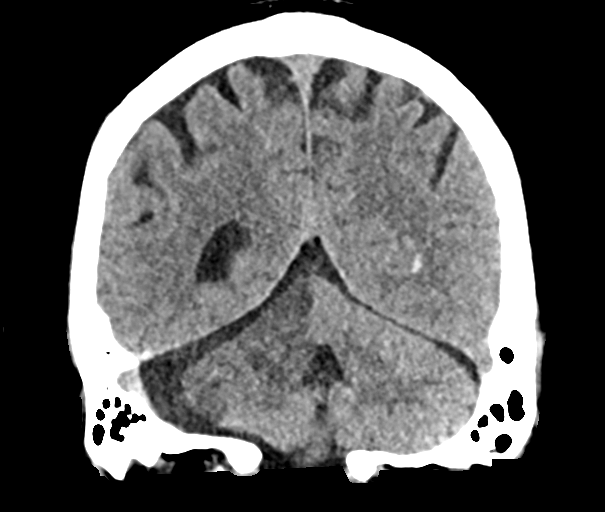
[im 32/72  brain]
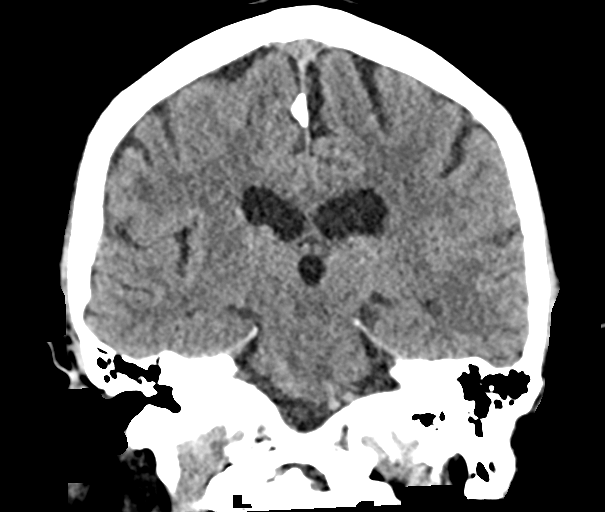
[im 40/72  brain]
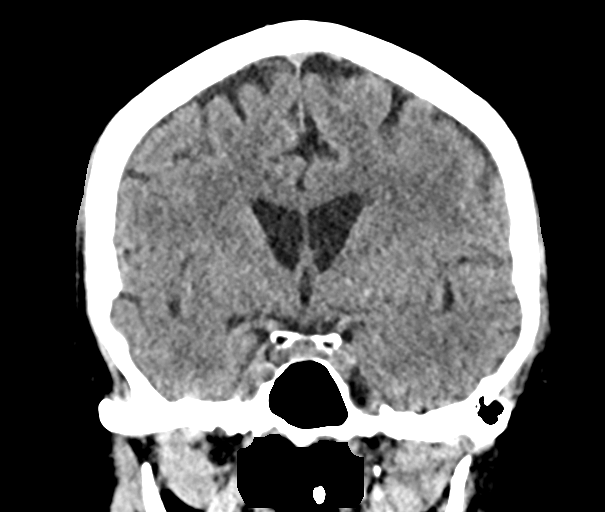

[Series 6: sag soft · sagittal · 0.31mm/px · 3 of 64 slices shown]
[im 22/64  brain]
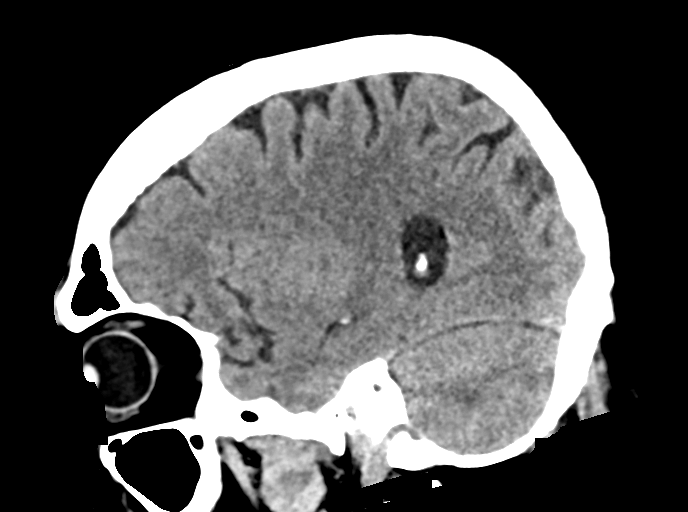
[im 32/64  brain]
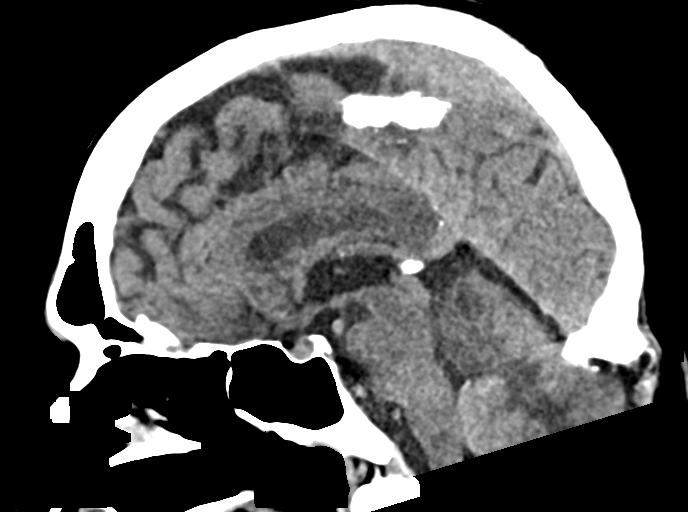
[im 43/64  brain]
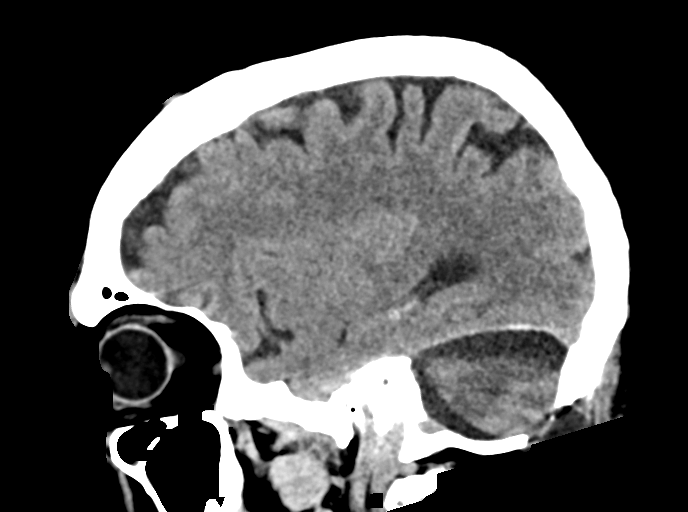

[16 of 47 positions shown; findings below may reference images not displayed]

FINDINGS: Brain: Occipital craniectomy. Acute infarction within the right
cerebellum with swelling and protrusion through the craniectomy
defect. Sufficient patency of the fourth ventricle. Small infarction
of the left cerebellum shown by MRI is not visible by CT.

Cerebral hemispheres do not show any acute or focal finding. Small
amount of blood dependent within the lateral ventricles as seen
previously. Ventricular size is stable. No new infarction.

Vascular: No acute vascular finding.

Skull: Negative other than the occipital craniectomy.

Sinuses/Orbits: Clear/normal

Other: None
IMPRESSION: No significant change. Infarction in the right cerebellum with
swelling. True shin of swollen brain through the occipital
craniectomy defect. Fourth ventricle remains patent. No change in
ventricular size. Small amount of blood dependent within the
occipital horns of the lateral ventricles has not increased. Small
acute left cerebellar infarction shown by MRI is not visible by CT.

## 2021-12-08 MED ORDER — CLONAZEPAM 0.25 MG PO TBDP
0.5000 mg | ORAL_TABLET | Freq: Every day | ORAL | Status: DC
  Administered 2021-12-09: 01:00:00 0.5 mg
  Filled 2021-12-08: qty 2

## 2021-12-08 MED ORDER — NUTRISOURCE FIBER PO PACK
1.0000 | PACK | Freq: Two times a day (BID) | ORAL | Status: DC
  Administered 2021-12-09 – 2021-12-25 (×33): 1
  Filled 2021-12-08 (×36): qty 1

## 2021-12-08 MED ORDER — LOPERAMIDE HCL 1 MG/7.5ML PO SUSP
2.0000 mg | Freq: Once | ORAL | Status: AC
  Administered 2021-12-08: 2 mg
  Filled 2021-12-08: qty 15

## 2021-12-08 NOTE — Progress Notes (Addendum)
STROKE TEAM PROGRESS NOTE   INTERVAL HISTORY Patient is seen in his room with his wife at the bedside.  His agitation has improved, and he reportedly slept well overnight.  He has been hemodynamically stable.  His neurological exam is improved today, and he is able to state his name, follow commands and recognize his wife.  Will continue clonazepam at night to assist with sleep.  Vitals:   12/08/21 0800 12/08/21 0900 12/08/21 1000 12/08/21 1100  BP: (!) 150/83 138/86 119/62 119/73  Pulse: 71 69 70 70  Resp: (!) 29 (!) 23 (!) 24 15  Temp: 98.2 F (36.8 C)     TempSrc: Oral     SpO2: 95% 97% 98% 96%  Weight:      Height:       CBC:  Recent Labs  Lab 12/04/21 0526 12/04/21 2359 12/07/21 0332 12/08/21 0358  WBC 7.2   < > 9.5 8.2  NEUTROABS 5.4  --   --   --   HGB 15.0   < > 13.7 14.2  HCT 46.5   < > 43.9 43.6  MCV 94.1   < > 96.1 94.0  PLT 207   < > PLATELET CLUMPS NOTED ON SMEAR, UNABLE TO ESTIMATE 193   < > = values in this interval not displayed.    Basic Metabolic Panel:  Recent Labs  Lab 12/04/21 0523 12/04/21 2359 12/05/21 0505 12/05/21 1128 12/06/21 0118 12/06/21 0743 12/07/21 0332 12/08/21 0358  NA 160*   < > 159*   < > 156*   < > 156* 151*  K 3.3*   < > 3.7   < > 3.3*   < > 4.9 4.3  CL 130*  --  >130*   < > CRITICAL RESULT CALLED TO, READ BACK BY AND VERIFIED WITH:   < > 126* 118*  CO2 23  --  20*   < > 22   < > 23 24  GLUCOSE 117*  --  107*   < > 134*   < > 113* 157*  BUN 30*  --  30*   < > 37*   < > 22 23  CREATININE 1.29*  --  1.16   < > 1.18   < > 0.79 0.77  CALCIUM 7.9*  --  7.4*   < > 7.5*   < > 8.0* 8.2*  MG 2.8*  --  2.7*  --  2.8*  --   --   --   PHOS 4.1  --  4.0  --   --   --   --   --    < > = values in this interval not displayed.    Lipid Panel:  Recent Labs  Lab 12/06/21 0118  TRIG 319*     HgbA1c:  No results for input(s): HGBA1C in the last 168 hours.  Urine Drug Screen:  No results for input(s): LABOPIA, COCAINSCRNUR,  LABBENZ, AMPHETMU, THCU, LABBARB in the last 168 hours.   Alcohol Level No results for input(s): ETH in the last 168 hours.  IMAGING past 24 hours No results found.  PHYSICAL EXAM  Temp:  [97.8 F (36.6 C)-100.1 F (37.8 C)] 98.2 F (36.8 C) (12/24 0800) Pulse Rate:  [68-92] 70 (12/24 1100) Resp:  [15-36] 15 (12/24 1100) BP: (95-167)/(59-94) 119/73 (12/24 1100) SpO2:  [93 %-100 %] 96 % (12/24 1100) Weight:  [90.4 kg] 90.4 kg (12/24 0359)  General - Well nourished, well developed elderly Caucasian male cardiovascular -  Regular rhythm and rate. Respiratory- respirations even and unlabored  NEURO:  Mental Status: Patient is drowsy but will open eyes to voice and touch.  He is able to state his name and recognizes his wife but does not know where he is or year.  PERRL, EOMI.  He is able to follow commands  Motor: antigravity with all four extremities.  Sensory: intact to LT in all four ext. Gait: no tested.    ASSESSMENT/PLAN Mr. Manfred Laspina is a 75 y.o. male with history of CABG x4, hyperlipidemia, prostate cancer, internal hemorrhoid, and a nonobstructive calculus in right interpole with hematuria presenting with acute onset vertigo nausea vomiting and an inability to walk.  At 10 AM on November 25, 2021 patient developed vertigo nausea and vomiting.  He also noted abdominal discomfort.  A code stroke was called on his arrival to triage.  Once the patient was in the CT scanner he developed more nausea and vomiting he went to a stat MRI which was positive for bilateral cerebellar strokes.  He then developed ataxia in his bilateral upper and lower extremities.  He was then given TNKase and went for a stat CTA of the head.  No LVO was shown on the CTA. Repeat MRI showed progression of edema related to the cerebellar infarcts with mass effect on the 4th ventricle. Neurosurgery consulted and hypertonic saline initiated. 12/13 suboccipital craniectomy overnight.  Patient tolerated procedure  well.  Speech therapy recommends resuming nectar thickened liquids with his diet.  Repeat CT 12/14 shows hemorrhagic transformation of patient's stroke.  Lovenox resumed as he is post 24 hours from hemorrhagic transformation of his stroke.  Patient's agitation has improved with trial of clonazepam, and his neurological exam has improved today.  Palliative care has been consulted to aid in [place] discussions.  Will obtain repeat CT head today to determine if ongoing bleeding exists and will start IV heparin to anticoagulate for atrial fibrillation if no bleeding is found.  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 ASPECTS 10.    Repeat CT-extensive swelling in the areas of the acute cerebellar infarction more extensive on the right than the left.  Mass-effect on the fourth ventricle.  No evidence of ventricular obstruction.  Stable lateral and third ventricles. Post craniectomy CT-Unchanged right greater than left cerebellar infarcts.  No new infarction.  Unchanged narrowing of the fourth ventricle.  No evidence of ventricular obstruction or hydrocephalus. Repeat CT 12/14 New hyperdensity in right cerebellar infarct, likely hemorrhage with no increased mass effect. CTA head & neck unremarkable MRI  acute infarct of the cerebellum bilaterally.  negative for hemorrhage.  MRI Repeat 12/12- Interval progression of edema associated to bilateral cerebellar acute infarcts with mass effect on the fourth ventricle and low lying right cerebellar tonsil. No hydrocephalus CT head 12/14 -right cerebellum hemorrhagic transformation without hydrocephalus CT repeat stable postoperative changes involving infarct resolving intraventricular blood.  No acute abnormalities 2D Echo EF 70-75% LDL 106 HgbA1c 5.8 VTE prophylaxis - SCDs aspirin 81 mg daily prior to admission, now on aspirin  81, plavix discontinued due to hemorrhagic transformation of cerebellar stroke. Will consider DOAC in 14 days post stroke if neuro and CT stable Therapy recommendations: Inpatient rehab- [facility] rehab closer to patients home Disposition: Pending  Cerebellar Edema s/p suboccipital decompression  3% normal saline initiated on 12/12 Sodium goal is 150-155 Na 156 S/p suboccipital decompression with Dr.  Ostergard 3% d/c'd and then resumed due to sodium levels Close neuro monitoring CT 12/13 reveals unchanged infarcts without evidence of hydrocephalus CT head 12/14 - right cerebellum hemorrhagic transformation without hydrocephalus CT head 12/17- Stable cerebellar infarct with hemorrhage.  No hydrocephalus or interval change  3% normal saline off   Hypertension Home meds: None Stable on the high end BP <160  PRN hydralazine  Atrial flutter - new diagnosis Cardiology on board As needed metoprolol for rate control Will start IV heparin if no further bleeding seen on today's CT head Consider DOAC in 14 days post stroke if neuro and CT stable  Fever Leukocytosis T-max 101.9-> afebrile now WBC 13.3->13.6->10.0->9.9-> 8.2 CXR 12/13 - Left lower lobe consolidation with small adjacent left pleural effusion. Findings are likely either due to pneumonia or aspiration. On unasyn   Hyperlipidemia Home meds: Atorvastatin 80 mg, resumed in hospital LDL 106, goal < 70 Atorvastatin 80 mg daily Continue statin at discharge  Delirium/agitation Encephalopathy Sundowning On precedex low dose, taper off as able Added clonidine yesterday Clonazepam 1mg  QHS today. Seroquel stopped due to concern for NMS. Could be due to fever/aspiration pneumonia Repeat CT 12/14 reveals no hydrocephalus Emphasize sleep hygiene  Dysphagia NPO for now MBSS done 12/16 [place] in place. Patient is only able to have sparing thickened liquids currently Will start TF and meds via cortrak  Aspiration Pneumonia :  Intubated 12/04/2021, extubated 12/22.  Started on vancomycin and ceftriaxone. Managed by ICU.  Other Stroke Risk Factors Advanced Age >/= 20  Obesity, Body mass index is 28.6 kg/m., BMI >/= 30 associated with increased.  Use of oral blood pressure medications.  Risk, recommend weight loss, diet and exercise as appropriate  Coronary artery disease s/p CABG  Other Active Problems Abdominal discomfort CTA abdomen pelvis-no evidence of active GI bleed.  Moderate aortic atherosclerosis.  Diverticulosis with no evidence of diverticulitis. Internal hemorrhoid Nonobstructive calculus in right interpole Hematuria one month ago Flomax DC'd and changed to Urecholine 10 mg 3 times daily Respiratory distress and low oxygen sats-presumed aspiration pneumonia intubated 12/04/2021, now extubated   Hospital day # 13  Patient seen and examined by NP/APP with MD. MD to update note as needed.   [place] , MSN, AGACNP-BC [facility] See Amion for schedule and pager information 12/08/2021 11:45 AM  ATTENDING ATTESTATION:  He is significantly improved today in terms of mental status. He is awake. Followed all commands. Oriented to name only, able to recognize his wife. Will discontinue his am dose of klonopin and keep nightly dose for now. Hopefully, we can continue titrating down. Repeat CT results pending but on my view I see some edema but no bleeding. Long discussion with his wife who is concerned about delay in rehab due to his confusion but feels much better now that he is improving.   Dr. Reeves Forth evaluated pt independently, reviewed imaging, chart, labs. Discussed and formulated plan with the APP. Please see APP note above for details.      This patient is critically ill due to respiratory distress, stroke s/p craniotomy and at significant risk of neurological worsening, death form heart failure, respiratory failure, recurrent stroke, bleeding from [facility], seizure, sepsis. This  patient's care requires constant monitoring of vital signs, hemodynamics, respiratory and cardiac monitoring, review of multiple databases, neurological assessment, discussion with family, other specialists and medical decision making of high complexity. I spent 35 minutes of neurocritical care time in the care of this patient.   Braya Habermehl  Deontra Pereyra,MD    To contact Stroke Continuity provider, please refer to [email or web address] After hours, contact General Neurology

## 2021-12-08 NOTE — Progress Notes (Signed)
CorTrak accidentally removed by patient. Notified CCM MD.

## 2021-12-08 NOTE — Progress Notes (Signed)
Palliative Medicine Inpatient Follow Up Note  Consulting Provider: Mick Sell, PA-C   Reason for consult:   [place] Palliative Medicine Consult  Reason for Consult? family trying to decide if we extubate if he would want to be reintubated and would he have good quality of life    HPI:  Per intake H&P --> [name] is a 75 y.o. male with history of CABG x4, hyperlipidemia, prostate cancer, internal hemorrhoid, and a nonobstructive calculus in right interpole with hematuria presenting with acute onset vertigo nausea vomiting and an inability to walk.  At 10 AM on November 25, 2021 patient developed vertigo nausea and vomiting.  He also noted abdominal discomfort.  A code stroke was called on his arrival to triage.  Once the patient was in the CT scanner he developed more nausea and vomiting he went to a stat MRI which was positive for bilateral cerebellar strokes.  He then developed ataxia in his bilateral upper and lower extremities.  He was then given TNKase and went for a stat CTA of the head.  No LVO was shown on the CTA. Repeat MRI showed progression of edema related to the cerebellar infarcts with mass effect on the 4th ventricle. Neurosurgery consulted and hypertonic saline initiated. 12/13 suboccipital craniectomy overnight.  Patient tolerated procedure well.  Speech therapy recommends resuming nectar thickened liquids with his diet.  Repeat CT 12/14 shows hemorrhagic transformation of patient's stroke.  Lovenox resumed today as he is post 24 hours from hemorrhagic transformation of his stroke.Palliative care was consulted to discuss goals of care in the setting of recent stroke and aspiration event requiring re-intubation.  Today's Discussion (12/08/2021):  *Please note that this is a verbal dictation therefore any spelling or grammatical errors are due to the "[place] [facility]" system interpretation.  Chart reviewed.   Per conversation with patients  RN, Ethridge Sollenberger was able to sleep throughout the night.   Upon assessment, Charbel was resting.  I met with his spouse, [name] at bedside. We discussed that presently, [name] has gotten some sedative for his agitation episodes yesterday which are helping in the sense that he can sleep.   [name] and I reviewed that she had been quite exhausted from all of this. She states that she was able to sleep last night which is rejuvenating. We reviewed the severity of Roberts illness(s) and the very complex/difficult decisions that she has had to make thoughtfully over the past few days. I empathized that her decisions are very emotionally and mentally overwhelming which can absolutely contribute to feelings of exhaustion. Offered Terri therapeutic listening.   We reviewed that yesterday Maricela made a statement that he "wanted to die" this was very alarming to [name] though she shares it makes one point clear in that he would not want a long term nutritional tube. She expresses that still she, [place]ke sister, Bethena Roys, and their son, [name] are hopeful that [name] can improve.   We discussed that this is a stepwise process and taking information as it comes is all that can be done at this time with considering what [name] would want is all that can be done. [name] was thankful for the time spent to review these feelings.   Questions and concerns addressed   Palliative support provided  Objective Assessment: Vital Signs Vitals:   12/08/21 0700 12/08/21 0800  BP: 139/89 (!) 150/83  Pulse: 73 71  Resp: (!) 27 (!) 29  Temp:  98.2 F (36.8 C)  SpO2: 97% 95%    Intake/Output Summary (Last 24 hours) at 12/08/2021 0940 Last data filed at 12/08/2021 0800 Gross per 24 hour  Intake 2155.14 ml  Output 800 ml  Net 1355.14 ml    Last Weight  Most recent update: 12/08/2021  3:59 AM    Weight  90.4 kg (199 lb 4.7 oz)            Gen:  Elderly Caucasian M in NAD HEENT: [place], dry mucous membranes CV:  Regular rate and irregular rhythm PULM: On 4LPM [place] ABD: soft/nontender EXT: Pedal edema Neuro: On sedation - responsive  SUMMARY OF RECOMMENDATIONS   Full Code / Full Scope of care for the time being --> Today patients wife expresses Tymar would not want a long term gastrostomy tube   Patient would not wish to live in a persistent declined state of being   Re-intubate and tracheostomy if reversible event occurs such as aspiration    Will need acute rehabilitation when discharged   Ongoing Palliative Team support  Time Spent: 35 Greater than 50% of the time was spent in counseling and coordination of care ______________________________________________________________________________________ [place] Team Team Cell Phone: [phone] Please utilize secure chat with additional questions, if there is no response within 30 minutes please call the above phone number  Palliative Medicine Team providers are available by phone from 7am to 7pm daily and can be reached through the team cell phone.  Should this patient require assistance outside of these hours, please call the patient's attending physician.

## 2021-12-08 NOTE — Progress Notes (Signed)
NAME:  [name], MRN:  [id number], DOB:  [birth year], LOS: 4 ADMISSION DATE:  11/25/2021, CONSULTATION DATE:  12/13 REFERRING MD:  Marcelle Overlie, CHIEF COMPLAINT:  Stroke symptoms   History of Present Illness:  75 y/o male admitted with a cerebellar stroke on 12/11, received TNKase, he developed increasing mass effect on the 4th ventricle, moved to the ICU. 12/13 he had a suboccipital craniectomy.  Repeat CT 12/14 showed hemorrhagic conversion.  Pertinent  Medical History  CAD, s/p CABG  HLD  OA,  Nephrolithiasis BPH  Prostate cancer   Interim History / Subjective:   Klonopin started yesterday, less agitation overall. Precedex weaned steadily.  Objective   Blood pressure (!) 150/83, pulse 71, temperature 98.2 F (36.8 C), temperature source Oral, resp. rate (!) 29, height 5\' 10"  (1.778 m), weight 90.4 kg, SpO2 95 %.        Intake/Output Summary (Last 24 hours) at 12/08/2021 0854 Last data filed at 12/08/2021 0800 Gross per 24 hour  Intake 2210.14 ml  Output 800 ml  Net 1410.14 ml   Filed Weights   12/05/21 0416 12/07/21 0334 12/08/21 0359  Weight: 87.3 kg 87.1 kg 90.4 kg    Examination: General: sleeping HEENT: MM pink/moist; [place] and cor trak in place Neuro: PERRL; following commands; delrium present CV: s1s2, RR; no m/r/g PULM: ctab; tachypneic but not labored GI: soft, bsx4 active  Extremities: warm/dry, no edema  Skin: no rashes or lesions    Na 151 WBC 8    Resolved problem list: Shock Acute respiratory failure with hypoxia  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 and compression of ventral pons s/p posterior decompression with craniectomy Hemorrhagic conversion of R cerebellar infarct P: -Stroke following -PT/OT/SLP -avoid fevers -CBG monitoring for goal <180 -continue ASA; hold plavix due to hemorrhagic conversion -Will need rehab post hospital, looking at [facility] which is close to home  Possible  aspiration pneumonia Right lower lobe pneumonia - s/p 7 days Unasyn P: -continue [place]; wean for sats >92% -pulm toiletry: IS and flutter -resp cultures pending; continue rocephin x7days  Dysphagia  - Modified barium swallow showing aspiration, now s/p P: -continue cortrak -continue tube feeds -SLP  Fever: improving Leukocytosis: improving; possible aspiration pneumonia P: -continue ceftriaxone x 7days -follow respiratory cultures -trend fever/wbc curve -follow bcx2  Chronic HFpEF Hypertension, uncontrolled CAD P: -clonidine 0.1 BID -carvedilol 12.5 BID -prn hydralazine  Induced hypernatremia - hypertonic now off Hypokalemia Hypocalcemia P: -trend bmp/mag and replete as needed -continue FWF  Hyperactive delirium P: -continue reduced dose home zanaflex -continue precedex; wean as able -continue clonidine  -klonopin 0.5 BID (not a home medication, can hopefully eventually wean off)  Proximal atrial flutter P: -coreg as above  BPH P: -Continue Flomax -trend uop  GOC P: -12/22: palliative consulted; wife decided that if his respiratory status worsens after extubation she would be okay with reintubation and trach if necessary.    Best Practice (right click and "Reselect all SmartList Selections" daily)   Diet/type: NPO tube feeds DVT prophylaxis: LMWH GI prophylaxis: PPI Lines: N/A Foley:  N/A Code Status:  full code Last date of multidisciplinary goals of care discussion [12/22 remains full code. Open to tracheostomy should he require prolonged mech vent]  Critical care time: 34 minutes   [facility] 12/08/2021, 8:54 AM  Please see [email or web address] for pager details.  From 7A-7P if no response, please call [phone]. After  hours, please call ELink [phone].

## 2021-12-09 ENCOUNTER — Inpatient Hospital Stay (HOSPITAL_COMMUNITY): Payer: Medicare Other

## 2021-12-09 DIAGNOSIS — I639 Cerebral infarction, unspecified: Secondary | ICD-10-CM | POA: Diagnosis not present

## 2021-12-09 LAB — BASIC METABOLIC PANEL
Anion gap: 7 (ref 5–15)
BUN: 25 mg/dL — ABNORMAL HIGH (ref 8–23)
CO2: 24 mmol/L (ref 22–32)
Calcium: 8.3 mg/dL — ABNORMAL LOW (ref 8.9–10.3)
Chloride: 118 mmol/L — ABNORMAL HIGH (ref 98–111)
Creatinine, Ser: 0.82 mg/dL (ref 0.61–1.24)
GFR, Estimated: >60 mL/min (ref >60)
Glucose, Bld: 142 mg/dL — ABNORMAL HIGH (ref 70–99)
Potassium: 3.4 mmol/L — ABNORMAL LOW (ref 3.5–5.1)
Sodium: 149 mmol/L — ABNORMAL HIGH (ref 135–145)

## 2021-12-09 LAB — CULTURE, BLOOD (ROUTINE X 2)
Culture: NO GROWTH
Culture: NO GROWTH
Special Requests: ADEQUATE

## 2021-12-09 LAB — GLUCOSE, CAPILLARY
Glucose-Capillary: 105 mg/dL — ABNORMAL HIGH (ref 70–99)
Glucose-Capillary: 114 mg/dL — ABNORMAL HIGH (ref 70–99)
Glucose-Capillary: 127 mg/dL — ABNORMAL HIGH (ref 70–99)
Glucose-Capillary: 138 mg/dL — ABNORMAL HIGH (ref 70–99)
Glucose-Capillary: 146 mg/dL — ABNORMAL HIGH (ref 70–99)

## 2021-12-09 LAB — CBC
HCT: 36.2 % — ABNORMAL LOW (ref 39.0–52.0)
Hemoglobin: 12.1 g/dL — ABNORMAL LOW (ref 13.0–17.0)
MCH: 30.7 pg (ref 26.0–34.0)
MCHC: 33.4 g/dL (ref 30.0–36.0)
MCV: 91.9 fL (ref 80.0–100.0)
Platelets: 245 10*3/uL (ref 150–400)
RBC: 3.94 MIL/uL — ABNORMAL LOW (ref 4.22–5.81)
RDW: 14.3 % (ref 11.5–15.5)
WBC: 6.7 10*3/uL (ref 4.0–10.5)
nRBC: 0 % (ref 0.0–0.2)

## 2021-12-09 IMAGING — DX DG ABD PORTABLE 1V
1 series · 1 of 1 positions shown · non-contrast
Comparison: [DATE]

CLINICAL DATA: Enteric tube placement

EXAM:
PORTABLE ABDOMEN - 1 VIEW

[abdomen]
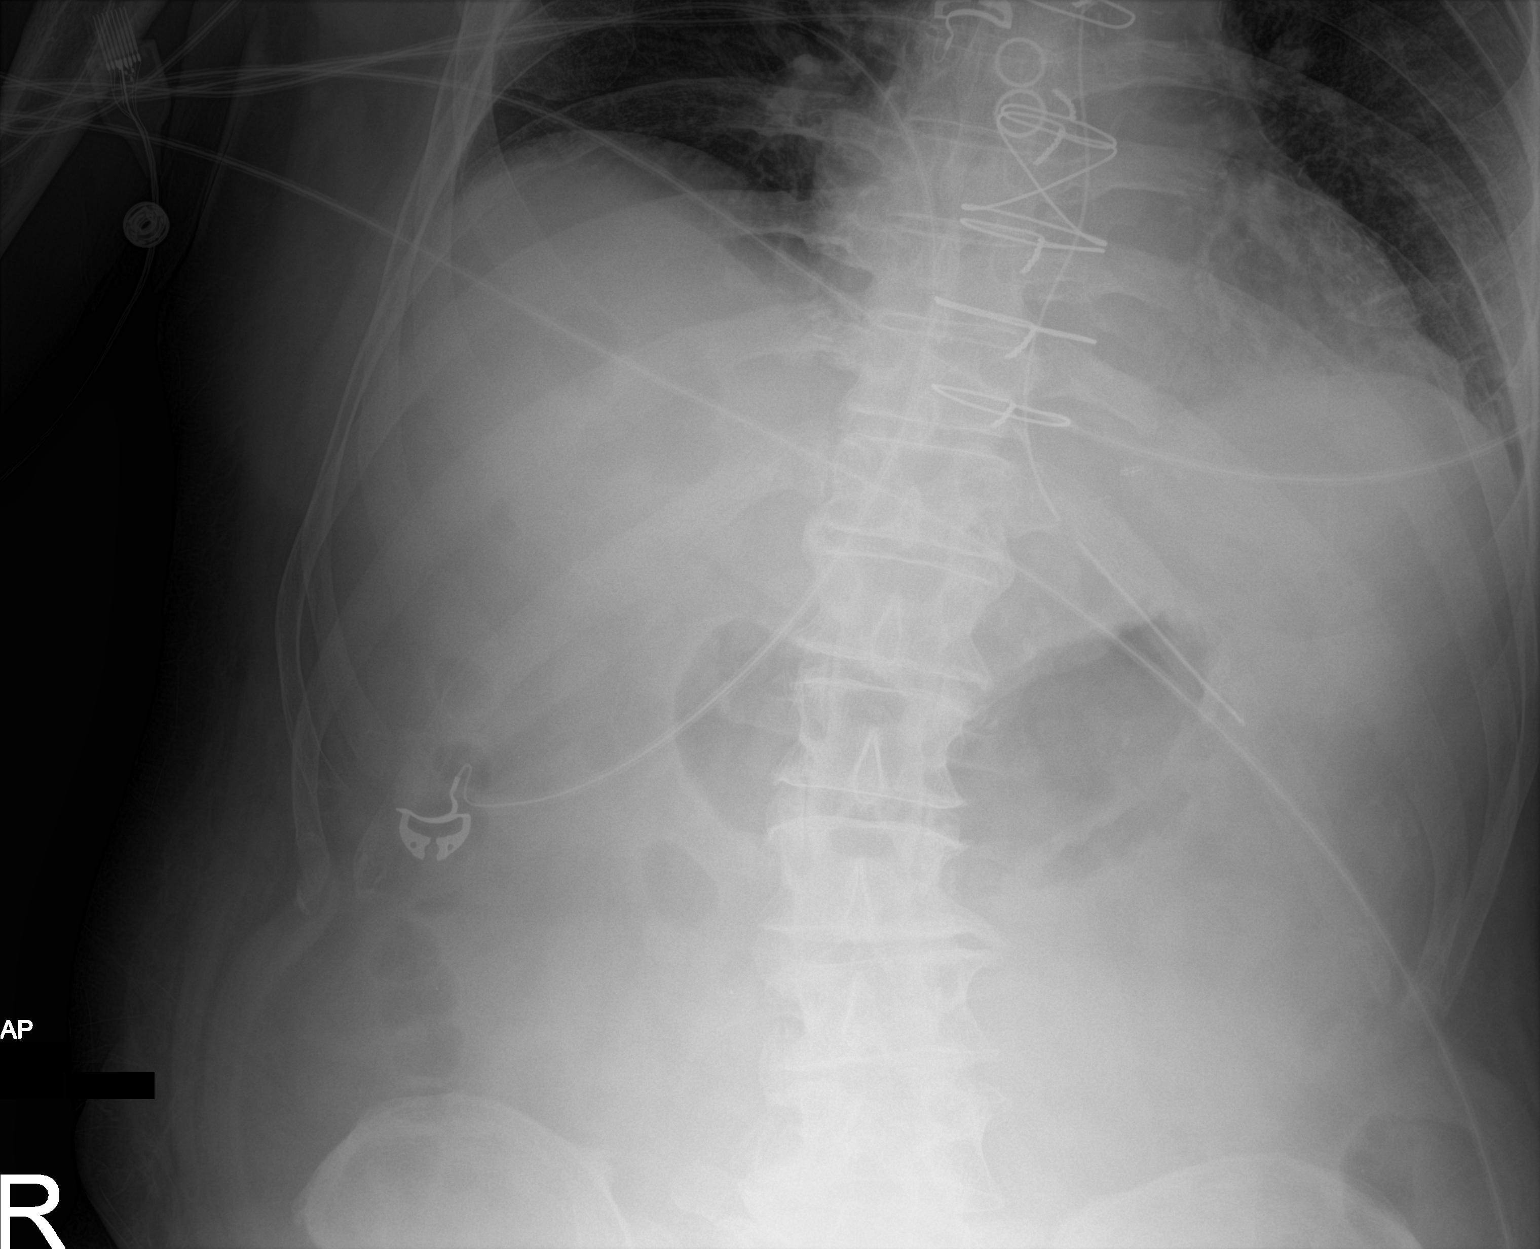

[1 of 1 positions shown; findings below may reference images not displayed]

FINDINGS: Incomplete assessment of the pelvis. Enteric tube side port projects
just below the GE junction. Tip projects over the proximal stomach.
Bibasilar opacities, likely atelectasis. No dilated loops of bowel
are seen. Degenerative changes of the lumbar spine.
IMPRESSION: Enteric tube tip projects over the proximal stomach.

## 2021-12-09 MED ORDER — CLONAZEPAM 0.25 MG PO TBDP
0.5000 mg | ORAL_TABLET | Freq: Two times a day (BID) | ORAL | Status: DC
Start: 2021-12-09 — End: 2021-12-09
  Administered 2021-12-09: 08:00:00 0.5 mg
  Filled 2021-12-09: qty 2

## 2021-12-09 MED ORDER — CLONIDINE HCL 0.1 MG PO TABS
0.1000 mg | ORAL_TABLET | Freq: Three times a day (TID) | ORAL | Status: DC
Start: 2021-12-09 — End: 2021-12-25
  Administered 2021-12-09 – 2021-12-25 (×46): 0.1 mg
  Filled 2021-12-09 (×46): qty 1

## 2021-12-09 MED ORDER — ORAL CARE MOUTH RINSE
15.0000 mL | Freq: Two times a day (BID) | OROMUCOSAL | Status: DC
  Administered 2021-12-09 – 2021-12-10 (×3): 15 mL via OROMUCOSAL

## 2021-12-09 MED ORDER — CHLORHEXIDINE GLUCONATE 0.12 % MT SOLN: 15.0000 mL | Freq: Two times a day (BID) | OROMUCOSAL | Status: DC

## 2021-12-09 MED ORDER — CHLORHEXIDINE GLUCONATE 0.12 % MT SOLN
15.0000 mL | Freq: Two times a day (BID) | OROMUCOSAL | Status: DC
  Administered 2021-12-09 – 2021-12-10 (×3): 15 mL via OROMUCOSAL
  Filled 2021-12-09 (×2): qty 15

## 2021-12-09 MED ORDER — CLONAZEPAM 0.25 MG PO TBDP
0.5000 mg | ORAL_TABLET | Freq: Every day | ORAL | Status: DC
  Administered 2021-12-09: 17:00:00 0.5 mg via ORAL
  Filled 2021-12-09: qty 2

## 2021-12-09 MED ORDER — ORAL CARE MOUTH RINSE: 15.0000 mL | Freq: Two times a day (BID) | OROMUCOSAL | Status: DC

## 2021-12-09 NOTE — Progress Notes (Signed)
04 CBG not transferring over... result per machine: 132

## 2021-12-09 NOTE — Progress Notes (Addendum)
STROKE TEAM PROGRESS NOTE   INTERVAL HISTORY Patient is seen in his room with his wife at the bedside.  He has been more agitated overnight and pulled out his corpak.  He required increased doses of clonazepam and precedex to treat the agitation.  His neurological exam is stable and he remains hemodynamically stable.  Vitals:   12/09/21 0500 12/09/21 0600 12/09/21 0700 12/09/21 0800  BP: 108/62 114/63 108/65 (!) 145/70  Pulse: (!) 47 (!) 49 (!) 51 (!) 51  Resp: (!) 31 14 (!) 24 (!) 28  Temp:   98.5 F (36.9 C)   TempSrc:   Oral   SpO2: 98% 99% 99% 99%  Weight:      Height:       CBC:  Recent Labs  Lab 12/04/21 0526 12/04/21 2359 12/07/21 0332 12/08/21 0358  WBC 7.2   < > 9.5 8.2  NEUTROABS 5.4  --   --   --   HGB 15.0   < > 13.7 14.2  HCT 46.5   < > 43.9 43.6  MCV 94.1   < > 96.1 94.0  PLT 207   < > PLATELET CLUMPS NOTED ON SMEAR, UNABLE TO ESTIMATE 193   < > = values in this interval not displayed.    Basic Metabolic Panel:  Recent Labs  Lab 12/04/21 0523 12/04/21 2359 12/05/21 0505 12/05/21 1128 12/06/21 0118 12/06/21 0743 12/07/21 0332 12/08/21 0358  NA 160*   < > 159*   < > 156*   < > 156* 151*  K 3.3*   < > 3.7   < > 3.3*   < > 4.9 4.3  CL 130*  --  >130*   < > CRITICAL RESULT CALLED TO, READ BACK BY AND VERIFIED WITH:   < > 126* 118*  CO2 23  --  20*   < > 22   < > 23 24  GLUCOSE 117*  --  107*   < > 134*   < > 113* 157*  BUN 30*  --  30*   < > 37*   < > 22 23  CREATININE 1.29*  --  1.16   < > 1.18   < > 0.79 0.77  CALCIUM 7.9*  --  7.4*   < > 7.5*   < > 8.0* 8.2*  MG 2.8*  --  2.7*  --  2.8*  --   --   --   PHOS 4.1  --  4.0  --   --   --   --   --    < > = values in this interval not displayed.    Lipid Panel:  Recent Labs  Lab 12/06/21 0118  TRIG 319*     HgbA1c:  No results for input(s): HGBA1C in the last 168 hours.  Urine Drug Screen:  No results for input(s): LABOPIA, COCAINSCRNUR, LABBENZ, AMPHETMU, THCU, LABBARB in the last 168  hours.   Alcohol Level No results for input(s): ETH in the last 168 hours.  IMAGING past 24 hours CT HEAD WO CONTRAST (5MM)  Result Date: 12/08/2021 CLINICAL DATA:  Follow-up stroke.  Right cerebellar stroke. EXAM: CT HEAD WITHOUT CONTRAST TECHNIQUE: Contiguous axial images were obtained from the base of the skull through the vertex without intravenous contrast. COMPARISON:  12/05/2021 FINDINGS: Brain: Occipital craniectomy. Acute infarction within the right cerebellum with swelling and protrusion through the craniectomy defect. Sufficient patency of the fourth ventricle. Small infarction of the left cerebellum shown by  MRI is not visible by CT. Cerebral hemispheres do not show any acute or focal finding. Small amount of blood dependent within the lateral ventricles as seen previously. Ventricular size is stable. No new infarction. Vascular: No acute vascular finding. Skull: Negative other than the occipital craniectomy. Sinuses/Orbits: Clear/normal Other: None IMPRESSION: No significant change. Infarction in the right cerebellum with swelling. True shin of swollen brain through the occipital craniectomy defect. Fourth ventricle remains patent. No change in ventricular size. Small amount of blood dependent within the occipital horns of the lateral ventricles has not increased. Small acute left cerebellar infarction shown by MRI is not visible by CT. Electronically Signed   By: Nelson Chimes M.D.   On: 12/08/2021 14:53   DG Abd Portable 1V  Result Date: 12/08/2021 CLINICAL DATA:  NG placement EXAM: PORTABLE ABDOMEN - 1 VIEW COMPARISON:  Radiograph dated 12/05/2021. FINDINGS: Partially visualized enteric tube with side-port in the epigastric area and tip in the proximal stomach. Bibasilar atelectasis or infiltrate. Cardiomegaly. Median sternotomy wires. IMPRESSION: Partially visualized enteric tube with tip in the proximal stomach. Electronically Signed   By: Anner Crete M.D.   On: 12/08/2021 23:47     PHYSICAL EXAM  Temp:  [98 F (36.7 C)-98.7 F (37.1 C)] 98.5 F (36.9 C) (12/25 0700) Pulse Rate:  [47-71] 51 (12/25 0800) Resp:  [13-31] 28 (12/25 0800) BP: (99-161)/(53-112) 145/70 (12/25 0800) SpO2:  [92 %-99 %] 99 % (12/25 0800)  General - Well nourished, well developed elderly Caucasian male cardiovascular - Regular rhythm and rate. Respiratory- respirations even and unlabored  NEURO:  Mental Status: Patient is drowsy but will open eyes to voice and touch.  He is able to state his name.  PERRL, EOMI.  He is able to follow commands  Motor: antigravity with all four extremities.  Sensory: intact to LT in all four ext. Gait: not tested.    ASSESSMENT/PLAN [name] is a 75 y.o. male with history of CABG x4, hyperlipidemia, prostate cancer, internal hemorrhoid, and a nonobstructive calculus in right interpole with hematuria presenting with acute onset vertigo nausea vomiting and an inability to walk.  At 10 AM on November 25, 2021 patient developed vertigo nausea and vomiting.  He also noted abdominal discomfort.  A code stroke was called on his arrival to triage.  Once the patient was in the CT scanner he developed more nausea and vomiting he went to a stat MRI which was positive for bilateral cerebellar strokes.  He then developed ataxia in his bilateral upper and lower extremities.  He was then given TNKase and went for a stat CTA of the head.  No LVO was shown on the CTA. Repeat MRI showed progression of edema related to the cerebellar infarcts with mass effect on the 4th ventricle. Neurosurgery consulted and hypertonic saline initiated. 12/13 suboccipital craniectomy overnight.  Patient tolerated procedure well.  Speech therapy recommends resuming nectar thickened liquids with his diet.  Repeat CT 12/14 shows hemorrhagic transformation of patient's stroke.  Lovenox resumed as he is post 24 hours from hemorrhagic transformation of his stroke.  Patient's agitation has improved  with trial of clonazepam, and his neurological exam has improved.  Palliative care has been consulted to aid in [place] discussions.  Repeat head CT shows no additional bleeding.   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 ASPECTS 10.    Repeat CT-extensive swelling in  the areas of the acute cerebellar infarction more extensive on the right than the left.  Mass-effect on the fourth ventricle.  No evidence of ventricular obstruction.  Stable lateral and third ventricles. Post craniectomy CT-Unchanged right greater than left cerebellar infarcts.  No new infarction.  Unchanged narrowing of the fourth ventricle.  No evidence of ventricular obstruction or hydrocephalus. Repeat CT 12/14 New hyperdensity in right cerebellar infarct, likely hemorrhage with no increased mass effect. CTA head & neck unremarkable MRI  acute infarct of the cerebellum bilaterally.  negative for hemorrhage.  MRI Repeat 12/12- Interval progression of edema associated to bilateral cerebellar acute infarcts with mass effect on the fourth ventricle and low lying right cerebellar tonsil. No hydrocephalus CT head 12/14 -right cerebellum hemorrhagic transformation without hydrocephalus CT repeat stable postoperative changes involving infarct resolving intraventricular blood.  No acute abnormalities 2D Echo EF 70-75% LDL 106 HgbA1c 5.8 VTE prophylaxis - SCDs aspirin 81 mg daily prior to admission, now on aspirin 81, plavix discontinued due to hemorrhagic transformation of cerebellar stroke. Will consider DOAC in 14 days post stroke if neuro and CT stable Therapy recommendations: Inpatient rehab- [facility] rehab closer to patients home Disposition: Pending  Cerebellar Edema s/p suboccipital decompression  3% normal saline initiated on 12/12 Sodium goal is 150-155 Na 156 S/p suboccipital decompression  with Dr. Zada Finders 3% d/c'd and then resumed due to sodium levels Close neuro monitoring CT 12/13 reveals unchanged infarcts without evidence of hydrocephalus CT head 12/14 - right cerebellum hemorrhagic transformation without hydrocephalus CT head 12/17- Stable cerebellar infarct with hemorrhage.  No hydrocephalus or interval change  3% normal saline off   Hypertension Home meds: None Stable on the high end BP <160  PRN hydralazine  Atrial flutter - new diagnosis Cardiology on board As needed metoprolol for rate control Will start IV heparin if no further bleeding seen on today's CT head Consider DOAC in 14 days post stroke if neuro and CT stable  Fever Leukocytosis T-max 101.9-> afebrile now WBC 13.3->13.6->10.0->9.9-> 8.2 CXR 12/13 - Left lower lobe consolidation with small adjacent left pleural effusion. Findings are likely either due to pneumonia or aspiration. On unasyn   Hyperlipidemia Home meds: Atorvastatin 80 mg, resumed in hospital LDL 106, goal < 70 Atorvastatin 80 mg daily Continue statin at discharge  Delirium/agitation Encephalopathy Sundowning On precedex low dose, taper off as able Added clonidine yesterday Clonazepam 1mg  QHS today. Seroquel stopped due to concern for NMS. Could be due to fever/aspiration pneumonia Repeat CT 12/14 reveals no hydrocephalus Emphasize sleep hygiene  Dysphagia NPO for now MBSS done 12/16 Cortak in place. Patient is only able to have sparing thickened liquids currently Will start TF and meds via cortrak  Aspiration Pneumonia : Intubated 12/04/2021, extubated 12/22.  Started on vancomycin and ceftriaxone. Managed by ICU.  Other Stroke Risk Factors Advanced Age >/= 71  Obesity, Body mass index is 28.6 kg/m., BMI >/= 30 associated with increased.  Use of oral blood pressure medications.  Risk, recommend weight loss, diet and exercise as appropriate  Coronary artery disease s/p CABG  Other Active Problems Abdominal  discomfort CTA abdomen pelvis-no evidence of active GI bleed.  Moderate aortic atherosclerosis.  Diverticulosis with no evidence of diverticulitis. Internal hemorrhoid Nonobstructive calculus in right interpole Hematuria one month ago Flomax DC'd and changed to Urecholine 10 mg 3 times daily Respiratory distress and low oxygen sats-presumed aspiration pneumonia intubated 12/04/2021, now extubated   Hospital day # 14  Patient seen and examined by NP/APP with MD.  MD to update note as needed.   [place] , MSN, AGACNP-BC [facility] See Amion for schedule and pager information 12/09/2021 9:07 AM  ATTENDING ATTESTATION:  Setback after a good morning/afternoon yesterday.  He got agitated. Klonopin added back by overnight team to BID. He got a dose this am. Recommend getting 0.5mg  at 1800. Wrist restrains in evening. Stop am klonopin dose. Goal is to get him out of ICU.   Dr. Reeves Forth evaluated pt independently, reviewed imaging, chart, labs. Discussed and formulated plan with the APP. Please see APP note above for details.    This patient is critically ill due to respiratory distress, stroke s/p tPA and at significant risk of neurological worsening, death form heart failure, respiratory failure, recurrent stroke, bleeding from [facility], seizure, sepsis. This patient's care requires constant monitoring of vital signs, hemodynamics, respiratory and cardiac monitoring, review of multiple databases, neurological assessment, discussion with family, other specialists and medical decision making of high complexity. I spent 35 minutes of neurocritical care time in the care of this patient.   Nancylee Gaines,MD   To contact Stroke Continuity provider, please refer to [email or web address] After hours, contact General Neurology

## 2021-12-09 NOTE — Progress Notes (Signed)
NAME:  [name], MRN:  [id number], DOB:  10-04-1946, LOS: 64 ADMISSION DATE:  11/25/2021, CONSULTATION DATE:  12/13 REFERRING MD:  Marcelle Overlie, CHIEF COMPLAINT:  Stroke symptoms   History of Present Illness:  75 y/o male admitted with a cerebellar stroke on 12/11, received TNKase, he developed increasing mass effect on the 4th ventricle, moved to the ICU. 12/13 he had a suboccipital craniectomy.  Repeat CT 12/14 showed hemorrhagic conversion.  Pertinent  Medical History  CAD, s/p CABG  HLD  OA,  Nephrolithiasis BPH  Prostate cancer   Interim History / Subjective:   Drowsy in the morning but perked up in afternoon, more appropriate, restraints removed then 1 bout of agitation in evening during which he removed cortrak.   Objective   Blood pressure 128/68, pulse (!) 48, temperature 98.5 F (36.9 C), temperature source Oral, resp. rate (!) 22, height 5\' 10"  (1.778 m), weight 90.4 kg, SpO2 99 %.        Intake/Output Summary (Last 24 hours) at 12/09/2021 1021 Last data filed at 12/09/2021 1000 Gross per 24 hour  Intake 2366.8 ml  Output 1300 ml  Net 1066.8 ml   Filed Weights   12/05/21 0416 12/07/21 0334 12/08/21 0359  Weight: 87.3 kg 87.1 kg 90.4 kg    Examination: General: sleeping HEENT: MM pink/moist; [place] and cor trak in place Neuro: PERRL; drowsy this morning, wakes to a nudge CV: s1s2, RR; no m/r/g PULM: rhonchorous; tachypneic but not labored GI: soft, bsx4 active  Extremities: warm/dry, no edema  Skin: no rashes or lesions    CBC, BMP not yet obtained today    Resolved problem list: Shock Acute respiratory failure with hypoxia  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 and compression of ventral pons s/p posterior decompression with craniectomy Hemorrhagic conversion of R cerebellar infarct P: -Stroke following - discuss timing of AC challenge -PT/OT/SLP -avoid fevers -CBG monitoring for goal <180 -continue ASA;  hold plavix due to hemorrhagic conversion -Will need rehab post hospital, looking at [facility] inpatient rehab which is close to home  Possible aspiration pneumonia Right lower lobe pneumonia - s/p 7 days Unasyn P: -continue [place]; wean for sats >92% -pulm toiletry: IS and flutter -resp cultures pending; continue rocephin x7days  Dysphagia  - Modified barium swallow showing aspiration, now s/p P: -continue NGT, will need to replace cortrak -continue tube feeds add fiber supplement for diarrhea -SLP  Fever: improving Leukocytosis: improving; possible aspiration pneumonia P: -continue ceftriaxone x 7days -follow respiratory cultures -trend fever/wbc curve -follow bcx2  Chronic HFpEF Hypertension, uncontrolled CAD P: -clonidine 0.1 q8 as below -carvedilol 12.5 BID -prn hydralazine  Induced hypernatremia - hypertonic now off Hypokalemia Hypocalcemia P: -trend bmp/mag and replete as needed -continue FWF  Hyperactive delirium P: -continue reduced dose home zanaflex -continue precedex; wean as able -increase to clonidine 0.1 q8  -klonopin 0.5 mg at 1800 (not a home medication, can hopefully eventually dc)  Proximal atrial flutter P: -coreg as above  BPH P: -Continue Flomax -trend uop  GOC P: -12/22: palliative consulted; wife decided that if his respiratory status worsens after extubation she would be okay with reintubation and trach if necessary.    Best Practice (right click and "Reselect all SmartList Selections" daily)   Diet/type: NPO tube feeds DVT prophylaxis: LMWH GI prophylaxis: PPI Lines: N/A Foley:  N/A Code Status:  full code Last date of multidisciplinary goals of care discussion [12/22 remains full code. Open to tracheostomy should he  require prolonged mech vent]  Critical care time: 33 minutes   Fredirick [facility] Pul[facility]/Critical Care  12/09/2021, 10:21 AM  Please see [email or web address] for pager details.  From 7A-7P if no response, please  call [phone]. After hours, please call ELink [phone].

## 2021-12-09 NOTE — Progress Notes (Signed)
MD came by to check on patient. We spoke about how precedex was off for some time, but then patient became agitated and began pulling at lines. He pulled out his NG tube. Dr. Reeves Forth and I spoke about the need to do something different for this patient in order to get him off the precedex. Dr. Reeves Forth would like for the physicians to get a good assessment on him tomorrow morning, and then he thinks it would be a good idea to begin Klonopin two times a day. There were some concerns about him being drowsy, but if we do not switch up this patient's medication regimen he will remain in the ICU on precedex.   Normajean Baxter, RN

## 2021-12-09 NOTE — Progress Notes (Signed)
This RN wasted 159mL of fentanyl with Montez Hageman, RN.  Normajean Baxter, RN

## 2021-12-10 ENCOUNTER — Inpatient Hospital Stay (HOSPITAL_COMMUNITY): Payer: Medicare Other

## 2021-12-10 DIAGNOSIS — I639 Cerebral infarction, unspecified: Secondary | ICD-10-CM | POA: Diagnosis not present

## 2021-12-10 DIAGNOSIS — R569 Unspecified convulsions: Secondary | ICD-10-CM | POA: Diagnosis not present

## 2021-12-10 DIAGNOSIS — I4892 Unspecified atrial flutter: Secondary | ICD-10-CM | POA: Diagnosis not present

## 2021-12-10 DIAGNOSIS — Z515 Encounter for palliative care: Secondary | ICD-10-CM | POA: Diagnosis not present

## 2021-12-10 DIAGNOSIS — Z7189 Other specified counseling: Secondary | ICD-10-CM | POA: Diagnosis not present

## 2021-12-10 DIAGNOSIS — J9602 Acute respiratory failure with hypercapnia: Secondary | ICD-10-CM | POA: Diagnosis not present

## 2021-12-10 DIAGNOSIS — Z930 Tracheostomy status: Secondary | ICD-10-CM

## 2021-12-10 DIAGNOSIS — J9601 Acute respiratory failure with hypoxia: Secondary | ICD-10-CM | POA: Diagnosis not present

## 2021-12-10 LAB — CBC
HCT: 37.8 % — ABNORMAL LOW (ref 39.0–52.0)
Hemoglobin: 12.3 g/dL — ABNORMAL LOW (ref 13.0–17.0)
MCH: 29.9 pg (ref 26.0–34.0)
MCHC: 32.5 g/dL (ref 30.0–36.0)
MCV: 92 fL (ref 80.0–100.0)
Platelets: 295 10*3/uL (ref 150–400)
RBC: 4.11 MIL/uL — ABNORMAL LOW (ref 4.22–5.81)
RDW: 14.1 % (ref 11.5–15.5)
WBC: 6.9 10*3/uL (ref 4.0–10.5)
nRBC: 0 % (ref 0.0–0.2)

## 2021-12-10 LAB — GLUCOSE, CAPILLARY
Glucose-Capillary: 103 mg/dL — ABNORMAL HIGH (ref 70–99)
Glucose-Capillary: 107 mg/dL — ABNORMAL HIGH (ref 70–99)
Glucose-Capillary: 120 mg/dL — ABNORMAL HIGH (ref 70–99)
Glucose-Capillary: 126 mg/dL — ABNORMAL HIGH (ref 70–99)
Glucose-Capillary: 129 mg/dL — ABNORMAL HIGH (ref 70–99)
Glucose-Capillary: 135 mg/dL — ABNORMAL HIGH (ref 70–99)
Glucose-Capillary: 94 mg/dL (ref 70–99)

## 2021-12-10 LAB — BASIC METABOLIC PANEL
Anion gap: 8 (ref 5–15)
BUN: 19 mg/dL (ref 8–23)
CO2: 24 mmol/L (ref 22–32)
Calcium: 7.9 mg/dL — ABNORMAL LOW (ref 8.9–10.3)
Chloride: 114 mmol/L — ABNORMAL HIGH (ref 98–111)
Creatinine, Ser: 0.69 mg/dL (ref 0.61–1.24)
GFR, Estimated: >60 mL/min (ref >60)
Glucose, Bld: 129 mg/dL — ABNORMAL HIGH (ref 70–99)
Potassium: 3.6 mmol/L (ref 3.5–5.1)
Sodium: 146 mmol/L — ABNORMAL HIGH (ref 135–145)

## 2021-12-10 LAB — POCT I-STAT 7, (LYTES, BLD GAS, ICA,H+H)
Acid-Base Excess: 1 mmol/L (ref 0.0–2.0)
Bicarbonate: 26 mmol/L (ref 20.0–28.0)
Calcium, Ion: 1.18 mmol/L (ref 1.15–1.40)
HCT: 32 % — ABNORMAL LOW (ref 39.0–52.0)
Hemoglobin: 10.9 g/dL — ABNORMAL LOW (ref 13.0–17.0)
O2 Saturation: 99 %
Patient temperature: 98.2
Potassium: 3.6 mmol/L (ref 3.5–5.1)
Sodium: 148 mmol/L — ABNORMAL HIGH (ref 135–145)
TCO2: 27 mmol/L (ref 22–32)
pCO2 arterial: 39.9 mmHg (ref 32.0–48.0)
pH, Arterial: 7.422 (ref 7.350–7.450)
pO2, Arterial: 132 mmHg — ABNORMAL HIGH (ref 83.0–108.0)

## 2021-12-10 IMAGING — DX DG CHEST 1V PORT
1 series · 1 of 1 positions shown · non-contrast
Comparison: [DATE], [DATE]

CLINICAL DATA: Tracheostomy

EXAM:
PORTABLE CHEST 1 VIEW

[chest ap]
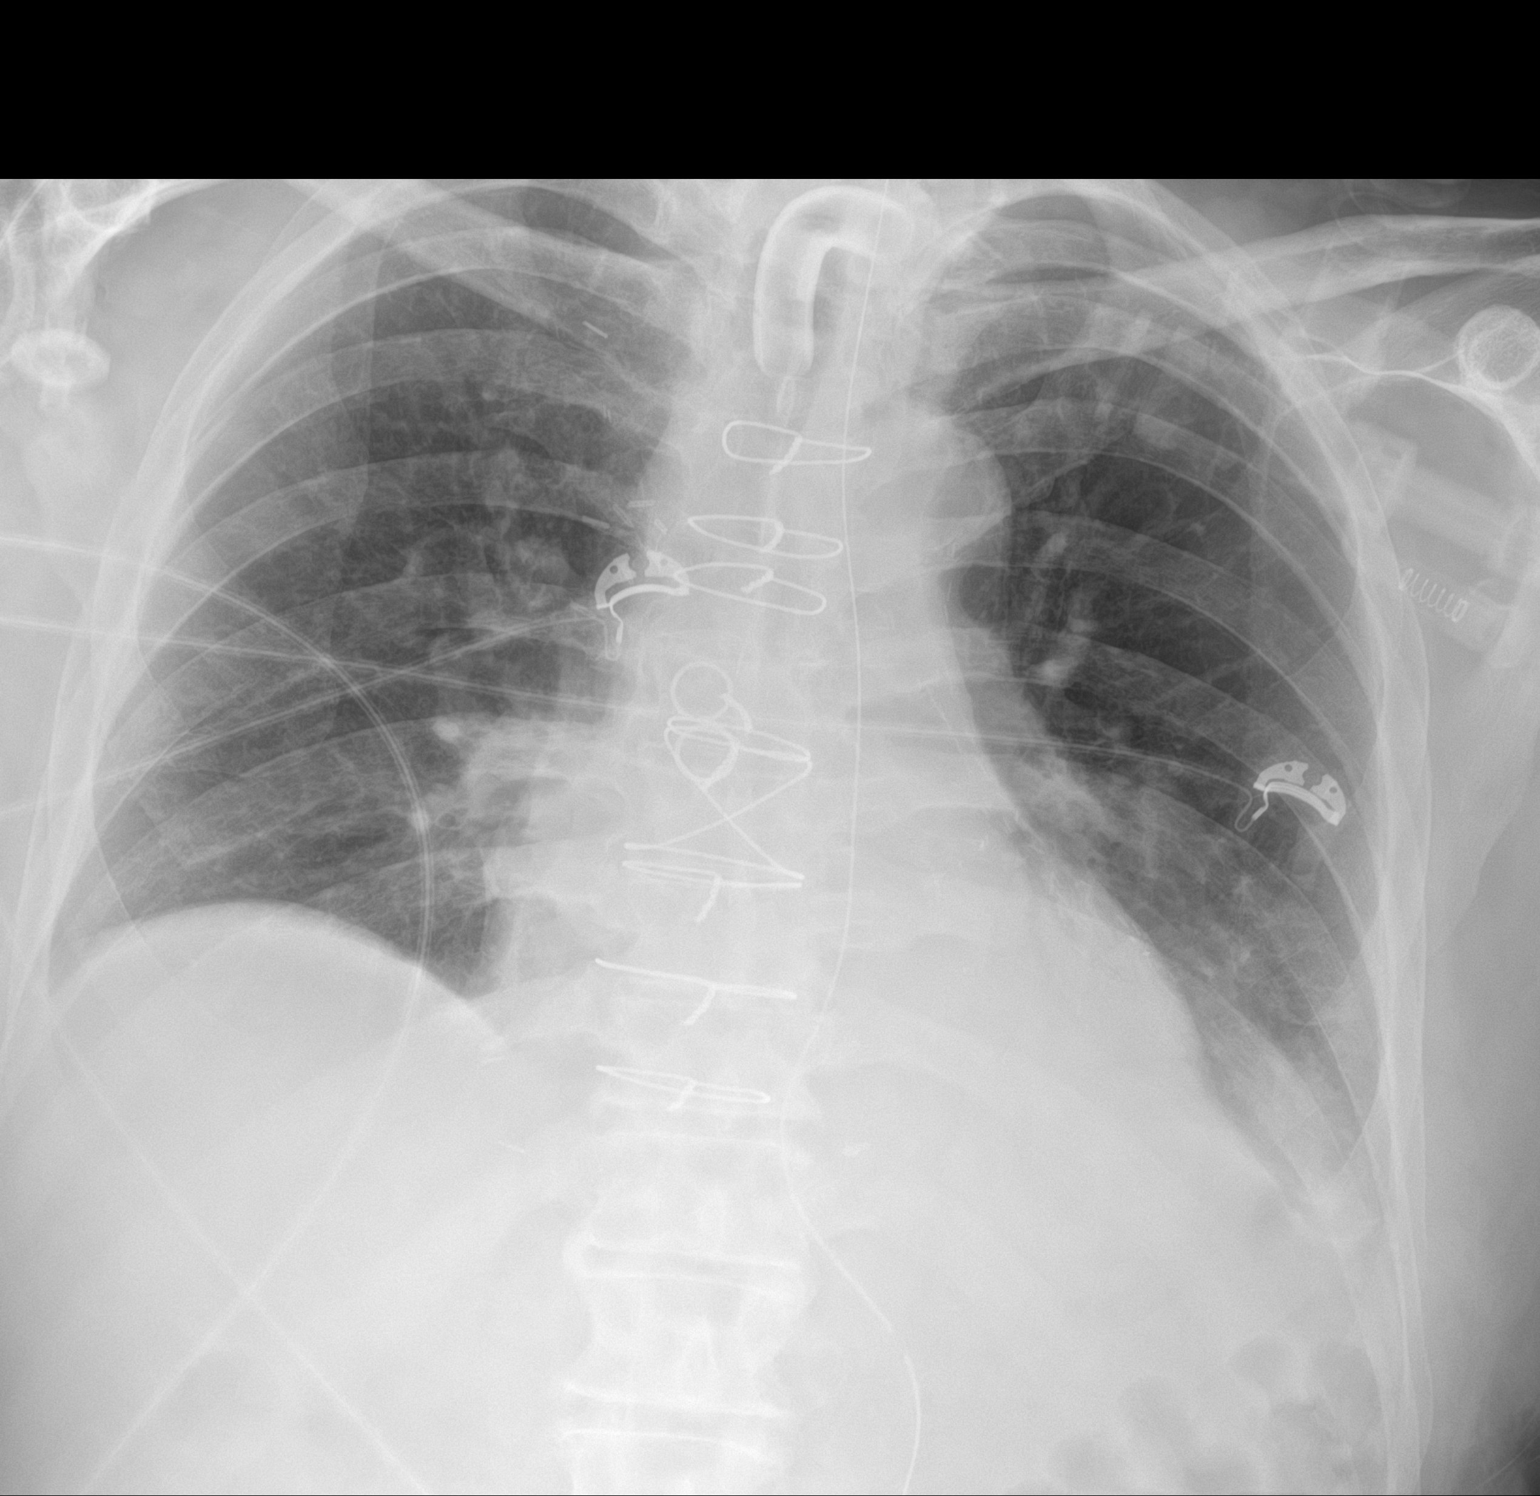

[1 of 1 positions shown; findings below may reference images not displayed]

FINDINGS: Post sternotomy changes. Esophageal tube tip below the diaphragm but
incompletely visualized. Interim placement of tracheostomy tube with
tip at the level of mid clavicles, about 4.5 cm superior to carina.
Similar small left effusion with slightly worsened basilar airspace
disease. Stable cardiomediastinal silhouette with atherosclerosis.
IMPRESSION: 1. Tracheostomy tube placement as above.
2. Small left-sided pleural effusion with worsening consolidation at
the left base.

## 2021-12-10 IMAGING — DX DG CHEST 1V PORT
1 series · 1 of 1 positions shown · non-contrast
Comparison: [DATE]

CLINICAL DATA: ETT, history of aspiration

EXAM:
PORTABLE CHEST 1 VIEW

[chest]
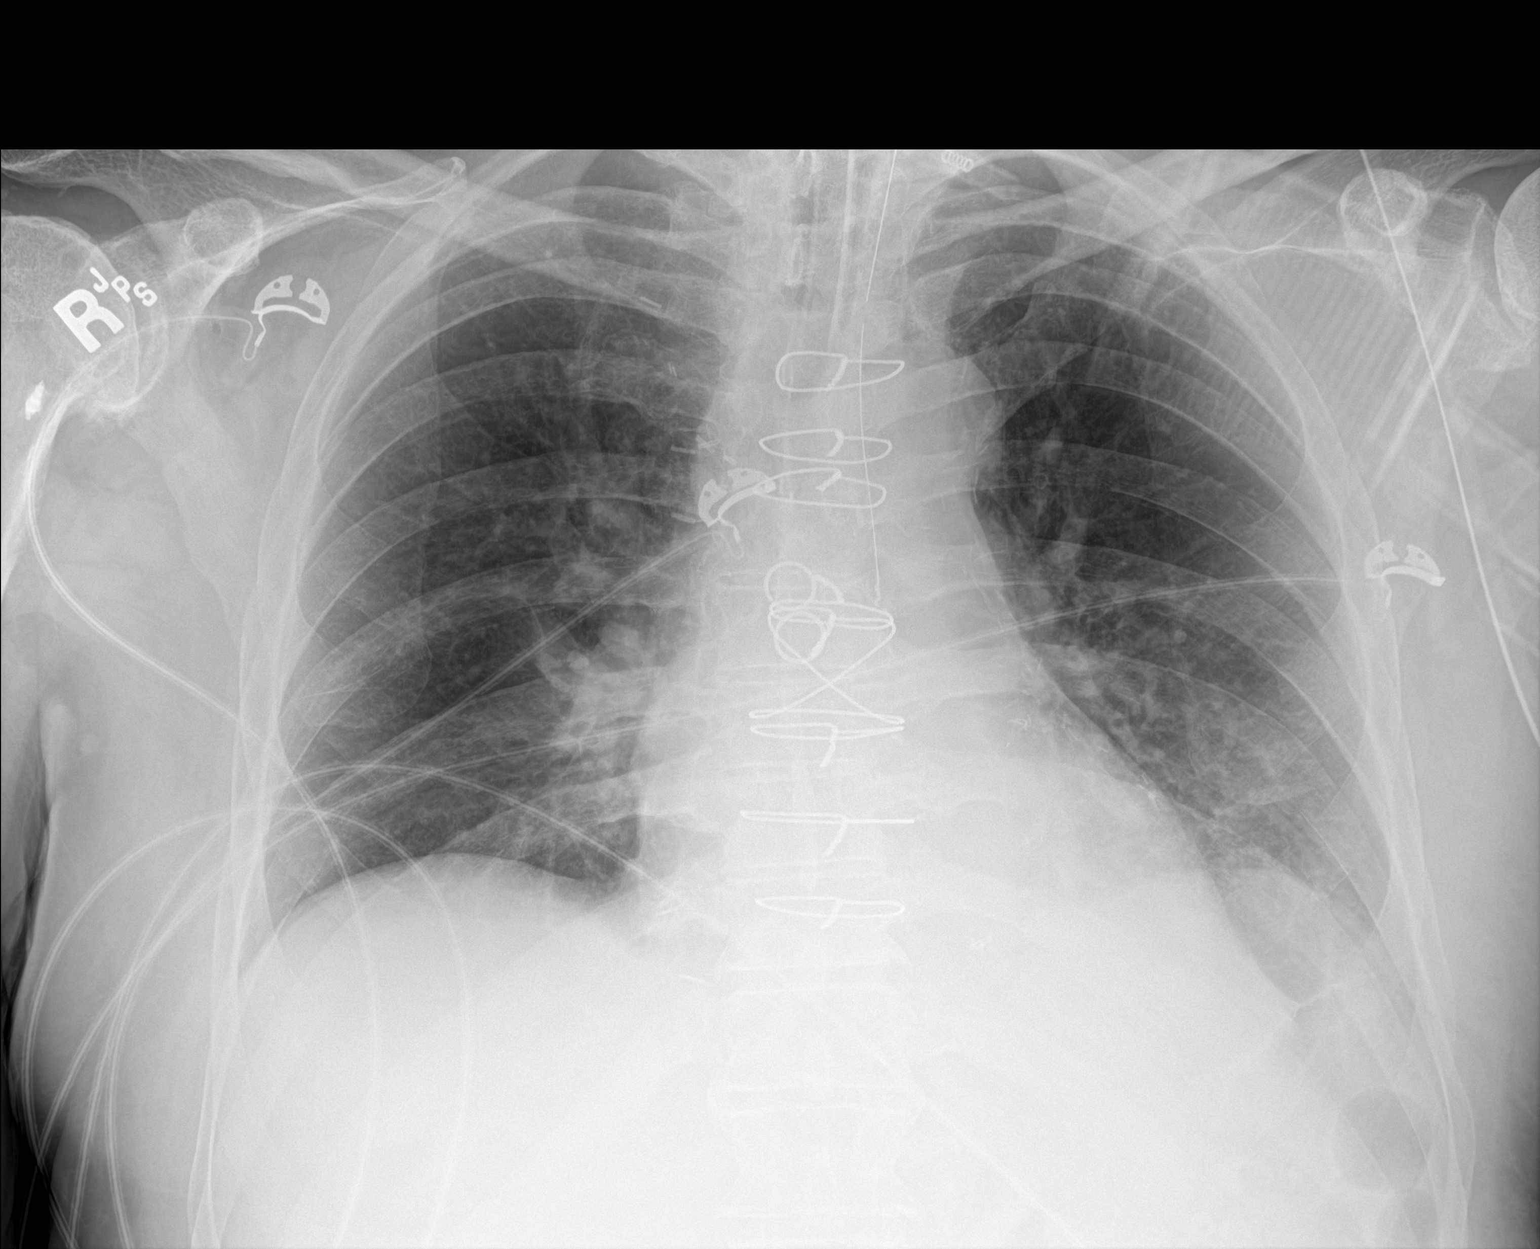

[1 of 1 positions shown; findings below may reference images not displayed]

FINDINGS: Mild patchy left lower lobe opacity, suspicious for pneumonia. Right
lung is clear.

Endotracheal tube terminates 4.5 cm above the carina.

Enteric tube terminates in the mid esophagus. Advancement into the
stomach (roughly 20 cm) is suggested.
IMPRESSION: Endotracheal tube terminates 4.5 cm above the carina.

Mild patchy left lower lobe opacity, suspicious for pneumonia.

Enteric tube terminates in the mid esophagus. Advancement to the
stomach (roughly 20 cm) is suggested.

## 2021-12-10 IMAGING — MR MR HEAD W/O CM
6 of 11 series · 24 of 48 positions shown · non-contrast
Comparison: [DATE] brain MRI

[DATE] head CT

CLINICAL DATA: Acute neurologic deficit

EXAM:
MRI HEAD WITHOUT CONTRAST
TECHNIQUE: Multiplanar, multiecho pulse sequences of the brain and surrounding
structures were obtained without intravenous contrast.

[Series 2: DWI · axial · 3.0mm · 0.94mm/px · z∈[-83,+63]mm · 7 of 100 slices shown (1 of 2)]
[im 1/100]
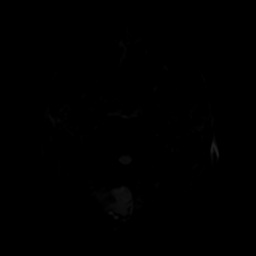
[im 17/100]
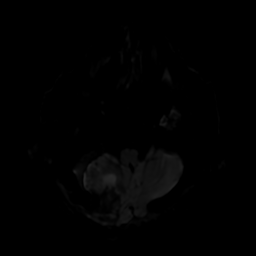
[im 34/100]
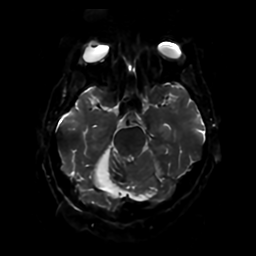
[im 50/100]
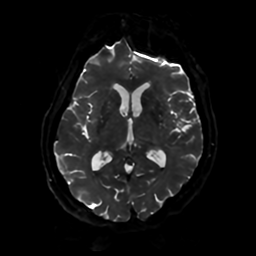
[im 67/100]
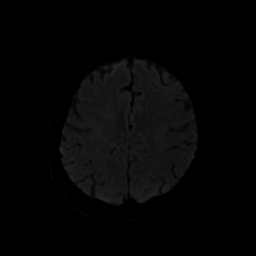
[im 83/100]
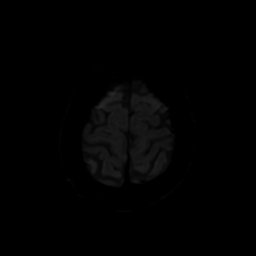
[im 100/100]
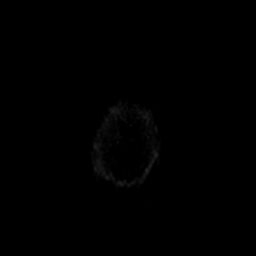

[Series 3: DWI · coronal · 4.0mm · 0.94mm/px · 5 of 73 slices shown (2 of 2)]
[im 1/73]
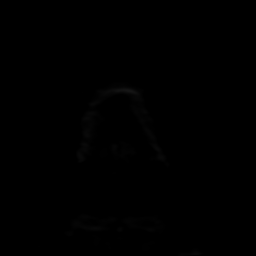
[im 19/73]
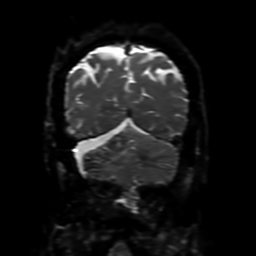
[im 37/73]
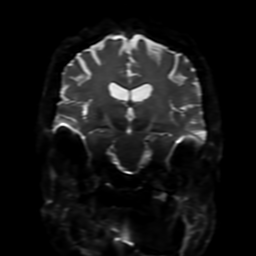
[im 55/73]
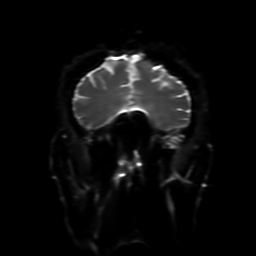
[im 73/73]
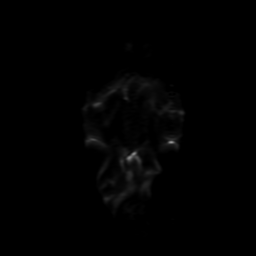

[Series 4: FLAIR · sagittal · 5.0mm · 0.23mm/px · 2 of 23 slices shown (1 of 2)]
[im 1/23]
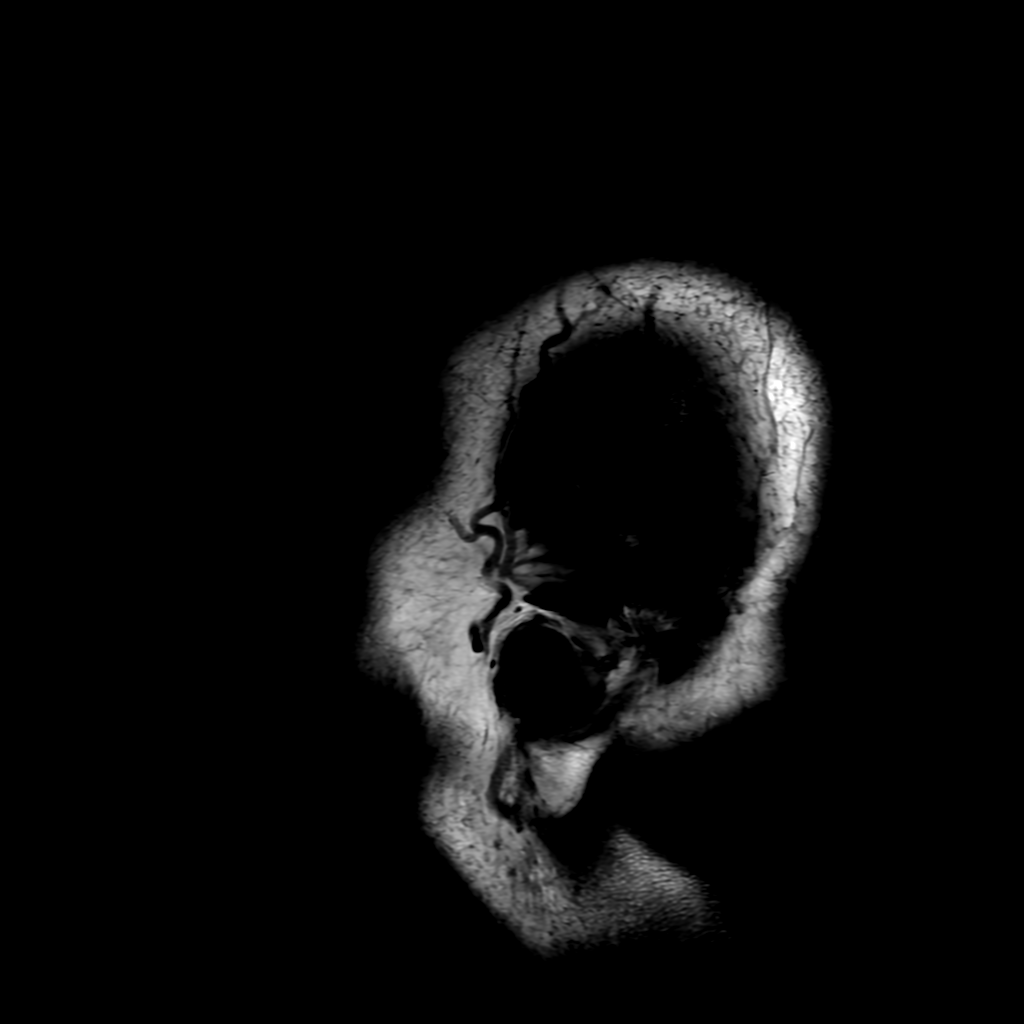
[im 23/23]
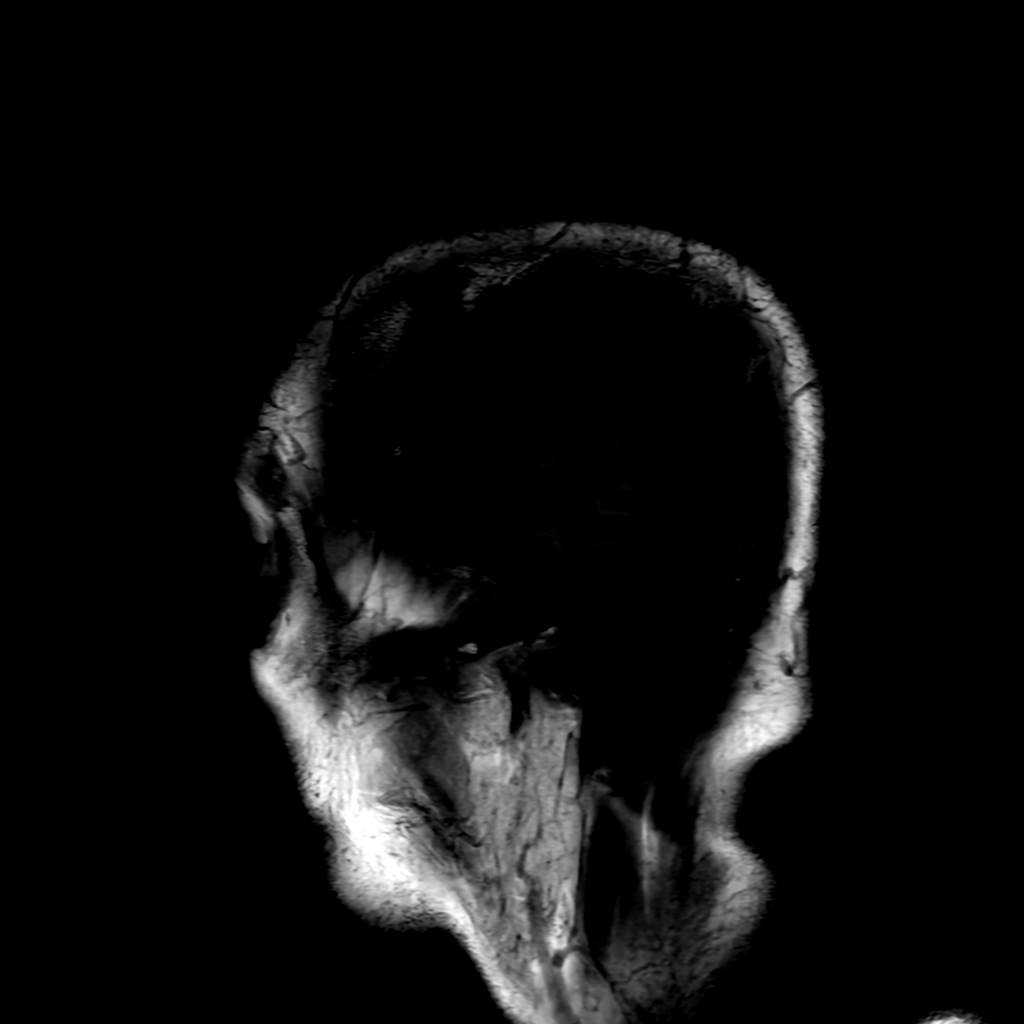

[Series 6: FLAIR · axial · 4.0mm · 0.47mm/px · z∈[-84,+64]mm · 3 of 35 slices shown (2 of 2)]
[im 1/35]
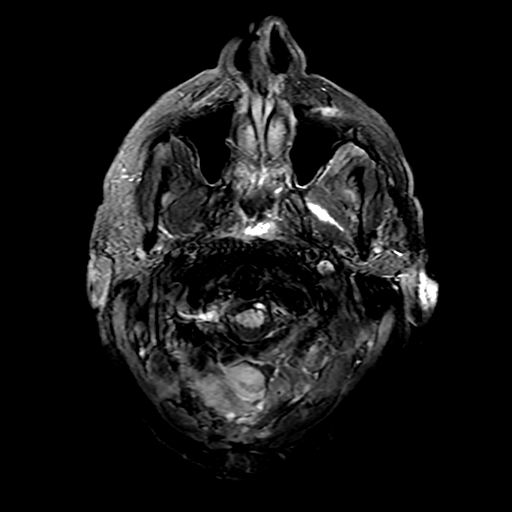
[im 18/35]
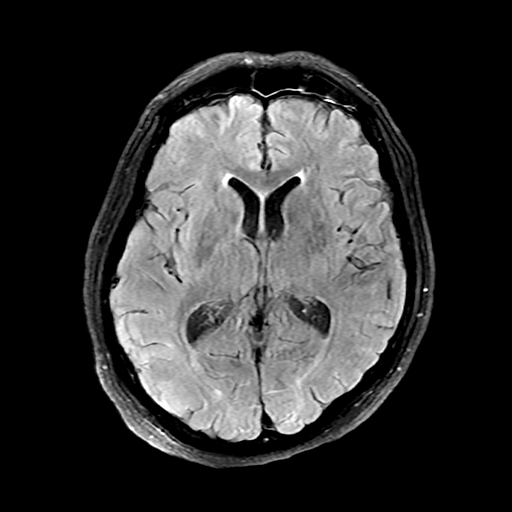
[im 35/35]
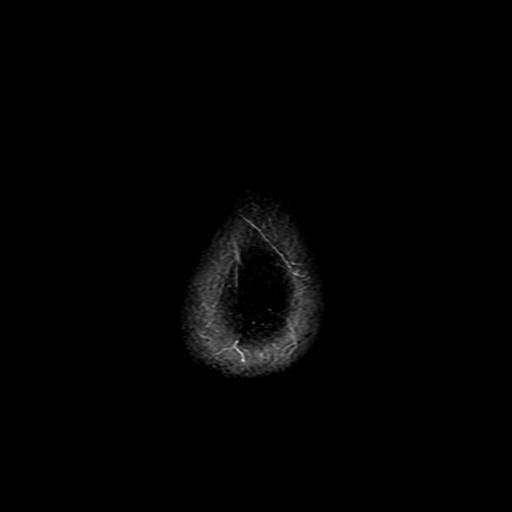

[Series 250: ADC · axial · 3.0mm · 0.94mm/px · z∈[-83,+63]mm · 4 of 50 slices shown (1 of 2)]
[im 1/50]
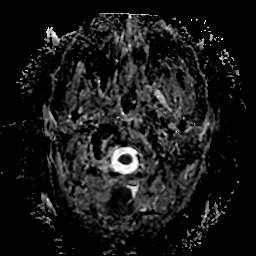
[im 17/50]
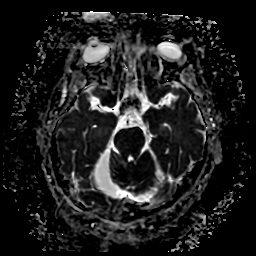
[im 33/50]
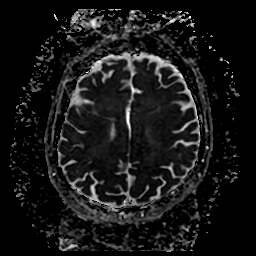
[im 50/50]
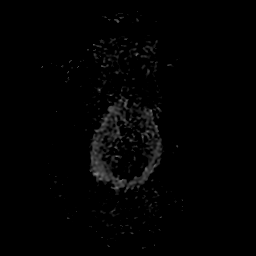

[Series 350: ADC · coronal · 4.0mm · 0.94mm/px · 3 of 36 slices shown (2 of 2)]
[im 1/36]
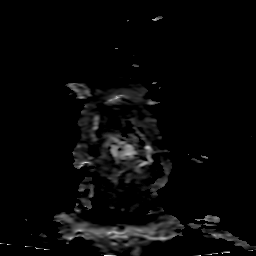
[im 18/36]
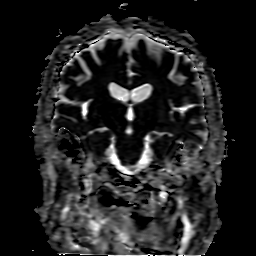
[im 36/36]
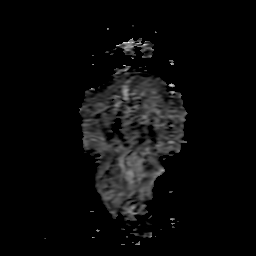

[24 of 48 positions shown; findings below may reference images not displayed]

FINDINGS: Brain: Mild residual diffusion abnormality in the right cerebellar
hemisphere with associated petechial hemorrhage. No new site of
acute ischemia. Moderate edema in the right cerebellar hemisphere.
Mild generalized volume loss. No hydrocephalus. There is a large
portion of the right cerebellum the remains herniated through the
craniectomy defect.

Vascular: Major flow voids are preserved.

Skull and upper cervical spine: Right suboccipital craniectomy.

Sinuses/Orbits:No paranasal sinus fluid levels or advanced mucosal
thickening. No mastoid or middle ear effusion. Normal orbits.
IMPRESSION: 1. Mild residual diffusion abnormality in the right cerebellar
hemisphere with associated petechial hemorrhage. No new site of
acute ischemia.
2. Large portion of the right cerebellum remains herniated through
the craniectomy defect.

## 2021-12-10 IMAGING — DX DG ABD PORTABLE 1V
1 series · 1 of 1 positions shown · non-contrast
Comparison: Chest radiograph dated [DATE]

CLINICAL DATA: NG tube placement

EXAM:
PORTABLE ABDOMEN - 1 VIEW

[abdomen]
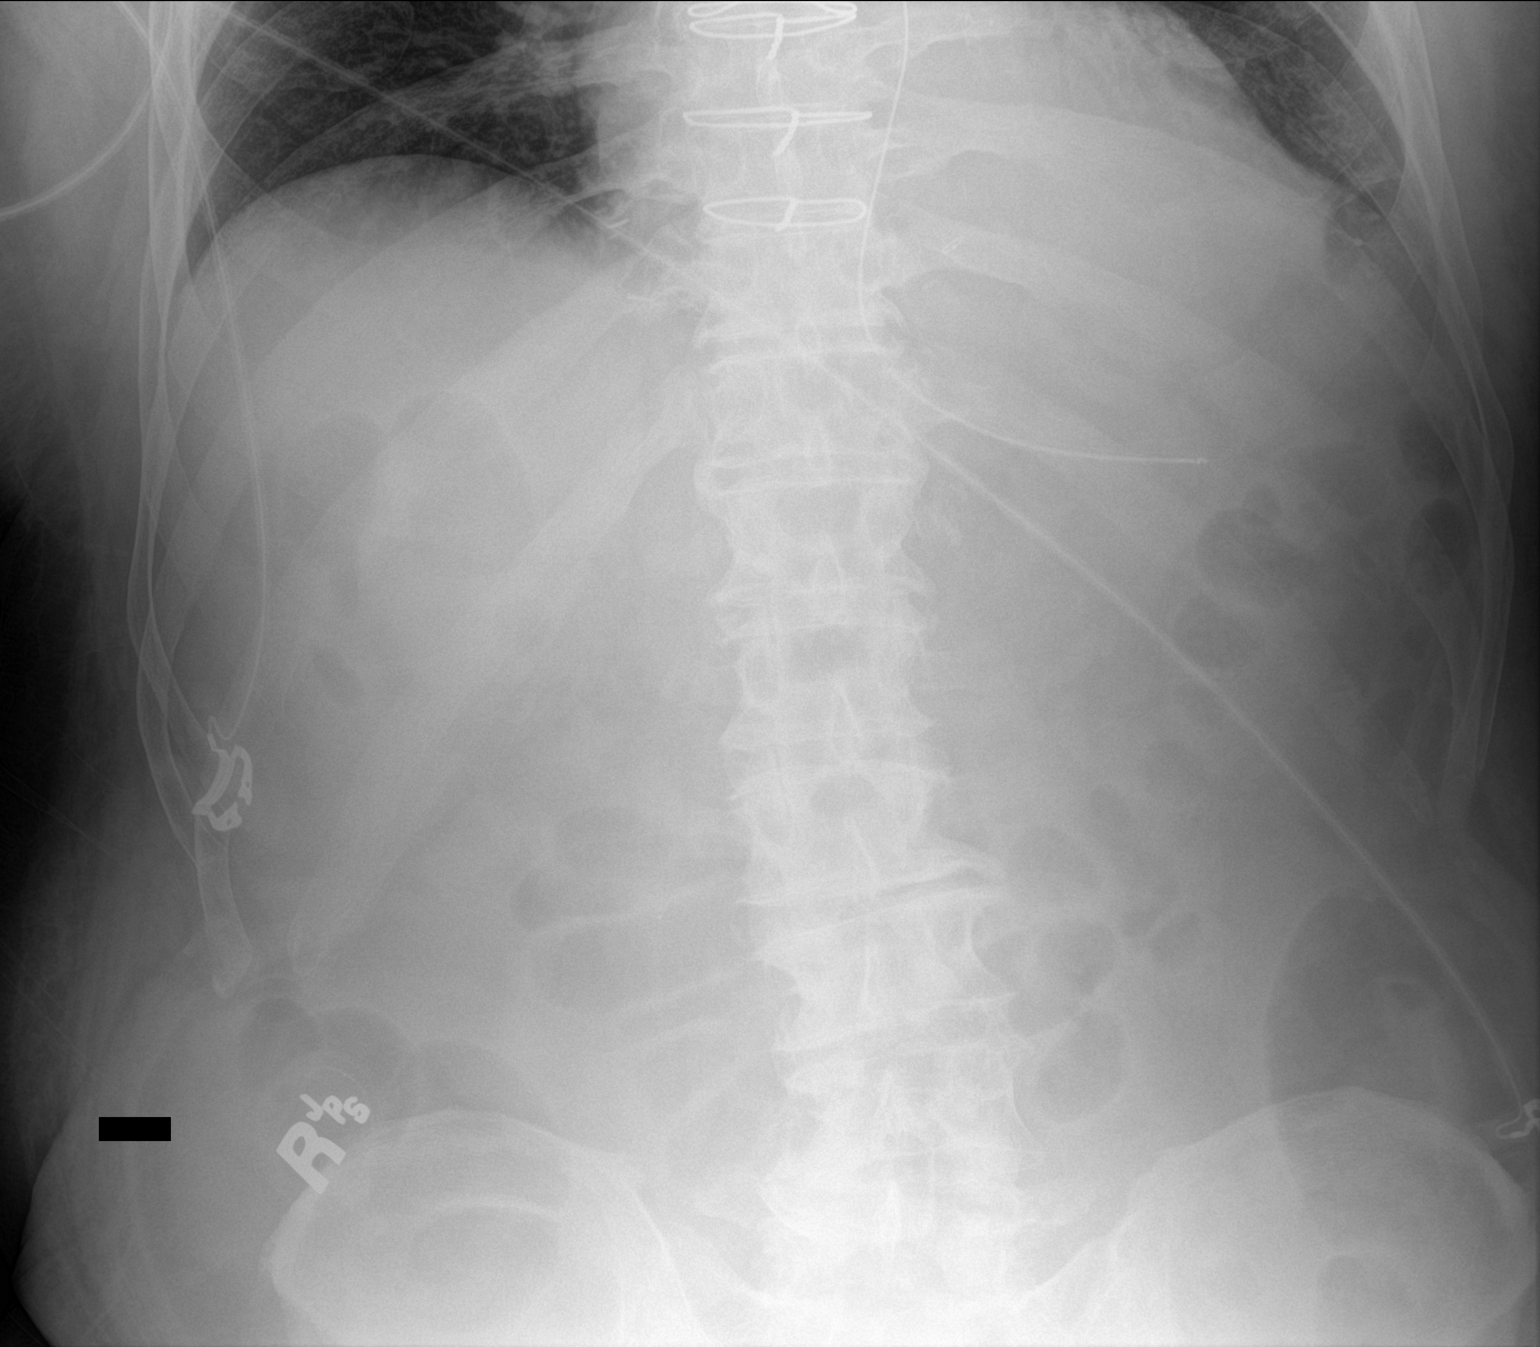

[1 of 1 positions shown; findings below may reference images not displayed]

FINDINGS: Enteric tube terminates in the proximal stomach.

Nonobstructive bowel gas pattern.
IMPRESSION: Enteric tube terminates in the proximal stomach.

## 2021-12-10 MED ORDER — ETOMIDATE 2 MG/ML IV SOLN: 40.0000 mg | Freq: Once | INTRAVENOUS | Status: AC

## 2021-12-10 MED ORDER — MIDAZOLAM HCL 2 MG/2ML IJ SOLN
INTRAMUSCULAR | Status: AC
  Administered 2021-12-10: 18:00:00 2 mg via INTRAVENOUS
  Filled 2021-12-10: qty 4

## 2021-12-10 MED ORDER — VECURONIUM BROMIDE 10 MG IV SOLR: 10.0000 mg | Freq: Once | INTRAVENOUS | Status: AC

## 2021-12-10 MED ORDER — CHLORHEXIDINE GLUCONATE 0.12% ORAL RINSE (MEDLINE KIT)
15.0000 mL | Freq: Two times a day (BID) | OROMUCOSAL | Status: DC
  Administered 2021-12-10 – 2021-12-12 (×4): 15 mL via OROMUCOSAL

## 2021-12-10 MED ORDER — ORAL CARE MOUTH RINSE
15.0000 mL | OROMUCOSAL | Status: DC
  Administered 2021-12-10 – 2021-12-12 (×14): 15 mL via OROMUCOSAL

## 2021-12-10 MED ORDER — LACTATED RINGERS IV BOLUS: 500.0000 mL | Freq: Once | INTRAVENOUS | Status: DC

## 2021-12-10 MED ORDER — PROPOFOL 10 MG/ML IV BOLUS: 500.0000 mg | Freq: Once | INTRAVENOUS | Status: DC

## 2021-12-10 MED ORDER — PHENYLEPHRINE 40 MCG/ML (10ML) SYRINGE FOR IV PUSH (FOR BLOOD PRESSURE SUPPORT)
PREFILLED_SYRINGE | INTRAVENOUS | Status: AC
  Filled 2021-12-10: qty 10

## 2021-12-10 MED ORDER — ETOMIDATE 2 MG/ML IV SOLN
INTRAVENOUS | Status: AC
  Filled 2021-12-10: qty 20

## 2021-12-10 MED ORDER — ETOMIDATE 2 MG/ML IV SOLN
15.0000 mg | Freq: Once | INTRAVENOUS | Status: AC
  Administered 2021-12-10: 06:00:00 15 mg via INTRAVENOUS

## 2021-12-10 MED ORDER — VECURONIUM BROMIDE 10 MG IV SOLR
INTRAVENOUS | Status: AC
  Administered 2021-12-10: 18:00:00 10 mg via INTRAVENOUS
  Filled 2021-12-10: qty 10

## 2021-12-10 MED ORDER — ROCURONIUM BROMIDE 10 MG/ML (PF) SYRINGE
90.0000 mg | PREFILLED_SYRINGE | Freq: Once | INTRAVENOUS | Status: AC
  Administered 2021-12-10: 06:00:00 90 mg via INTRAVENOUS

## 2021-12-10 MED ORDER — ETOMIDATE 2 MG/ML IV SOLN
INTRAVENOUS | Status: AC
  Administered 2021-12-10: 18:00:00 20 mg via INTRAVENOUS
  Filled 2021-12-10: qty 10

## 2021-12-10 MED ORDER — NOREPINEPHRINE 4 MG/250ML-% IV SOLN
INTRAVENOUS | Status: AC
  Administered 2021-12-10: 18:00:00 5 ug/min via INTRAVENOUS
  Filled 2021-12-10: qty 250

## 2021-12-10 MED ORDER — MIDAZOLAM HCL 2 MG/2ML IJ SOLN: 5.0000 mg | Freq: Once | INTRAMUSCULAR | Status: AC

## 2021-12-10 MED ORDER — NOREPINEPHRINE 4 MG/250ML-% IV SOLN: 2.0000 ug/min | INTRAVENOUS | Status: DC

## 2021-12-10 MED ORDER — SODIUM CHLORIDE 0.9 % IV SOLN: 250.0000 mL | INTRAVENOUS | Status: DC

## 2021-12-10 MED ORDER — ROCURONIUM BROMIDE 10 MG/ML (PF) SYRINGE
PREFILLED_SYRINGE | INTRAVENOUS | Status: AC
  Filled 2021-12-10: qty 10

## 2021-12-10 MED ORDER — FENTANYL CITRATE PF 50 MCG/ML IJ SOSY: 200.0000 ug | PREFILLED_SYRINGE | Freq: Once | INTRAMUSCULAR | Status: DC

## 2021-12-10 MED ORDER — IPRATROPIUM-ALBUTEROL 0.5-2.5 (3) MG/3ML IN SOLN
3.0000 mL | Freq: Four times a day (QID) | RESPIRATORY_TRACT | Status: DC
  Administered 2021-12-10 – 2021-12-12 (×9): 3 mL via RESPIRATORY_TRACT
  Filled 2021-12-10 (×8): qty 3

## 2021-12-10 NOTE — Progress Notes (Signed)
0500 - pt breathing increased to 30-40 respirations per minute. Increased secretions in airway. Pt NT suctioned several times; RT at bedside. CCM notified.

## 2021-12-10 NOTE — Procedures (Signed)
Percutaneous Tracheostomy Procedure Note   Kyuss Hale  [id number]  04/14/1946  Date:12/10/21  Time:6:26 PM   Provider Performing:Arnie Maiolo Jerilynn Mages Verlee Monte  Procedure: Percutaneous Tracheostomy with Bronchoscopic Guidance (31600)  Indication(s) Acute respiratory failure with hypoxia Multifactorial acute encephalopathy  Consent Risks of the procedure as well as the alternatives and risks of each were explained to the patient and/or caregiver.  Consent for the procedure was obtained.  Anesthesia Etomidate, Versed, Fentanyl, Vecuronium   Time Out Verified patient identification, verified procedure, site/side was marked, verified correct patient position, special equipment/implants available, medications/allergies/relevant history reviewed, required imaging and test results available.   Sterile Technique Maximal sterile technique including sterile barrier drape, hand hygiene, sterile gown, sterile gloves, mask, hair covering.    Procedure Description Appropriate anatomy identified by palpation.  Patient's neck prepped and draped in sterile fashion.  1% lidocaine with epinephrine was used to anesthetize skin overlying neck.  1.5cm incision made and blunt dissection performed until tracheal rings could be easily palpated.   Then a size 8-0 Shiley tracheostomy was placed under bronchoscopic visualization using usual Seldinger technique and serial dilation.   Bronchoscope confirmed placement above the carina.  Tracheostomy was sutured in place with adhesive pad to protect skin under pressure.    Patient connected to ventilator.   Complications/Tolerance None; patient tolerated the procedure well. Chest X-ray is ordered to confirm no post-procedural complication.   EBL Minimal   Specimen(s) None

## 2021-12-10 NOTE — Progress Notes (Signed)
NAME:  [name], MRN:  [id number], DOB:  01-Oct-1946, LOS: 32 ADMISSION DATE:  11/25/2021, CONSULTATION DATE:  12/13 REFERRING MD:  Marcelle Overlie, CHIEF COMPLAINT:  Stroke symptoms   History of Present Illness:  75 y/o male admitted with a cerebellar stroke on 12/11, received TNKase, he developed increasing mass effect on the 4th ventricle, moved to the ICU. 12/13 he had a suboccipital craniectomy.  Repeat CT 12/14 showed hemorrhagic conversion.  Pertinent  Medical History  CAD, s/p CABG  HLD  OA,  Nephrolithiasis BPH  Prostate cancer   Interim History / Subjective:   Reintubated overnight in setting of trouble managing secretions, increased oxygen requirement, encephalopathy and impending respiratory failure. Discussed goals of care this morning with patient's wife and her sister, neurology.   Objective   Blood pressure 138/60, pulse (!) 49, temperature 98.2 F (36.8 C), temperature source Axillary, resp. rate 18, height 5\' 10"  (1.778 m), weight 90.4 kg, SpO2 100 %.    Vent Mode: PRVC FiO2 (%):  [50 %-100 %] 50 % Set Rate:  [18 bmp] 18 bmp Vt Set:  [580 mL] 580 mL PEEP:  [5 cmH20] 5 cmH20 Plateau Pressure:  [19 cmH20] 19 cmH20   Intake/Output Summary (Last 24 hours) at 12/10/2021 1128 Last data filed at 12/10/2021 0800 Gross per 24 hour  Intake 1331.81 ml  Output 1175 ml  Net 156.81 ml   Filed Weights   12/05/21 0416 12/07/21 0334 12/08/21 0359  Weight: 87.3 kg 87.1 kg 90.4 kg    Examination: General: intubated, sedated  HEENT: MM pink/moist Neuro: PERRL; drowsy this morning, wakes to a nudge, loud voice CV: s1s2, RR; no m/r/g PULM: rhonchorous; tachypneic but not labored, equal chest rise GI: soft, bsx4 active  Extremities: warm/dry, no edema  Skin: no rashes or lesions    Na 148 S Cr stable   Resolved problem list: Shock Acute respiratory failure with hypoxia  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 and compression of  ventral pons s/p posterior decompression with craniectomy Hemorrhagic conversion of R cerebellar infarct P: -Stroke following - discuss timing of AC challenge after MRI today -PT/OT/SLP -avoid fevers -CBG monitoring for goal <180 -continue ASA; hold plavix due to hemorrhagic conversion -Will need rehab post hospital, looking at [facility] inpatient rehab which is close to home  Acute hypoxic respiratory failure Possible aspiration pneumonia Right lower lobe pneumonia - s/p 7 days Unasyn P: -full vent support -RASS -1 -tentative plan for percutaneous tracheostomy as below -continue [place]; wean for sats >92% -pulm toiletry: IS and flutter -continue rocephin x7days  Dysphagia  - Modified barium swallow showing aspiration, now s/p P: -continue NGT, tentative plan for PEG tube as below -continue tube feeds add fiber supplement for diarrhea -SLP  Fever: improving Leukocytosis: improving; possible aspiration pneumonia P: -continue ceftriaxone x 7days -follow respiratory cultures -trend fever/wbc curve -follow bcx2  Chronic HFpEF Hypertension, uncontrolled CAD P: -clonidine 0.1 q8 as below -carvedilol 12.5 BID on hold in setting of bradycardia as we assess response to sedation -prn hydralazine  Induced hypernatremia - hypertonic now off Hypokalemia Hypocalcemia P: -trend bmp/mag and replete as needed -continue FWF  Hyperactive delirium P: -continue reduced dose home zanaflex -continue precedex; wean as able -increase to clonidine 0.1 q8  -klonopin 0.5 mg at 1800 (not a home medication, can hopefully eventually dc)  Proximal atrial flutter P: -coreg as above  BPH P: -Continue Flomax -trend uop  GOC P: -12/26: palliative following. Plan for trach if  MRI today does not show evidence of new devastating neurologic injury   Best Practice (right click and "Reselect all SmartList Selections" daily)   Diet/type: NPO tube feeds DVT prophylaxis: LMWH GI prophylaxis:  PPI Lines: N/A Foley:  N/A Code Status:  full code Last date of multidisciplinary goals of care discussion [12/26 remains full code. Trach if MRI does not show evidence of new devastating neurologic injury]  Critical care time: 45 minutes   Fredir[facility]  12/10/2021, 11:28 AM  Please see [email or web address] for pager details.  From 7A-7P if no response, please call [phone]. After hours, please call ELink [phone].

## 2021-12-10 NOTE — Progress Notes (Addendum)
STROKE TEAM PROGRESS NOTE   INTERVAL HISTORY Wife, sister, and sister in law at the bedside. Patient had to be reintubated this morning for respiratory distress and inability to manage secretions. Extensive discussion about tracheostomy with family during rounds.  Repeat MRI scheduled for today and then we will make final decision in regards to proceeding with a tracheostomy. Neuro exam limited by sedation.   Vitals:   12/10/21 0622 12/10/21 0700 12/10/21 0800 12/10/21 1000  BP: (!) 158/72 (!) 150/83 (!) 143/72 138/60  Pulse: 60 (!) 52 (!) 51 (!) 49  Resp: _0 Temp:   98.2 F (36.8 C)   TempSrc:   Axillary   SpO2: 100% 100% 100% 100%  Weight:      Height:       CBC:  Recent Labs  Lab 12/04/21 0526 12/04/21 2359 12/09/21 1421 12/10/21 0630 12/10/21 0850  WBC 7.2   < > 6.7 6.9  --   NEUTROABS 5.4  --   --   --   --   HGB 15.0   < > 12.1* 12.3* 10.9*  HCT 46.5   < > 36.2* 37.8* 32.0*  MCV 94.1   < > 91.9 92.0  --   PLT 207   < > 245 295  --    < > = values in this interval not displayed.    Basic Metabolic Panel:  Recent Labs  Lab 12/04/21 0523 12/04/21 2359 12/05/21 0505 12/05/21 1128 12/06/21 0118 12/06/21 0743 12/09/21 1421 12/10/21 0630 12/10/21 0850  NA 160*   < > 159*   < > 156*   < > 149* 146* 148*  K 3.3*   < > 3.7   < > 3.3*   < > 3.4* 3.6 3.6  CL 130*  --  >130*   < > CRITICAL RESULT CALLED TO, READ BACK BY AND VERIFIED WITH:   < > 118* 114*  --   CO2 23  --  20*   < > 22   < > 24 24  --   GLUCOSE 117*  --  107*   < > 134*   < > 142* 129*  --   BUN 30*  --  30*   < > 37*   < > 25* 19  --   CREATININE 1.29*  --  1.16   < > 1.18   < > 0.82 0.69  --   CALCIUM 7.9*  --  7.4*   < > 7.5*   < > 8.3* 7.9*  --   MG 2.8*  --  2.7*  --  2.8*  --   --   --   --   PHOS 4.1  --  4.0  --   --   --   --   --   --    < > = values in this interval not displayed.    Lipid Panel:  Recent Labs  Lab 12/06/21 0118  TRIG 319*     HgbA1c:  No results for  input(s): HGBA1C in the last 168 hours.  Urine Drug Screen:  No results for input(s): LABOPIA, COCAINSCRNUR, LABBENZ, AMPHETMU, THCU, LABBARB in the last 168 hours.   Alcohol Level No results for input(s): ETH in the last 168 hours.  IMAGING past 24 hours Portable Chest x-ray  Result Date: 12/10/2021 CLINICAL DATA:  ETT, history of aspiration EXAM: PORTABLE CHEST 1 VIEW COMPARISON:  12/06/2021 FINDINGS: Mild patchy left lower lobe opacity, suspicious for  pneumonia. Right lung is clear. Endotracheal tube terminates 4.5 cm above the carina. Enteric tube terminates in the mid esophagus. Advancement into the stomach (roughly 20 cm) is suggested. IMPRESSION: Endotracheal tube terminates 4.5 cm above the carina. Mild patchy left lower lobe opacity, suspicious for pneumonia. Enteric tube terminates in the mid esophagus. Advancement to the stomach (roughly 20 cm) is suggested. Electronically Signed   By: Julian Hy M.D.   On: 12/10/2021 07:05   DG Abd Portable 1V  Result Date: 12/10/2021 CLINICAL DATA:  NG tube placement EXAM: PORTABLE ABDOMEN - 1 VIEW COMPARISON:  Chest radiograph dated 12/10/2021 FINDINGS: Enteric tube terminates in the proximal stomach. Nonobstructive bowel gas pattern. IMPRESSION: Enteric tube terminates in the proximal stomach. Electronically Signed   By: Julian Hy M.D.   On: 12/10/2021 08:02   DG Abd Portable 1V  Result Date: 12/09/2021 CLINICAL DATA:  Enteric tube placement EXAM: PORTABLE ABDOMEN - 1 VIEW COMPARISON:  December 08, 2021 FINDINGS: Incomplete assessment of the pelvis. Enteric tube side port projects just below the GE junction. Tip projects over the proximal stomach. Bibasilar opacities, likely atelectasis. No dilated loops of bowel are seen. Degenerative changes of the lumbar spine. IMPRESSION: Enteric tube tip projects over the proximal stomach. Electronically Signed   By: Valentino Saxon M.D.   On: 12/09/2021 15:57    PHYSICAL EXAM  Temp:   [98.2 F (36.8 C)-98.6 F (37 C)] 98.2 F (36.8 C) (12/26 0800) Pulse Rate:  [49-72] 49 (12/26 1000) Resp:  [14-55] 18 (12/26 1000) BP: (118-189)/(60-102) 138/60 (12/26 1000) SpO2:  [92 %-100 %] 100 % (12/26 1000) FiO2 (%):  [100 %] 100 % (12/26 0622)  General - Well nourished, well developed, intubated on sedation. Ophthalmologic - fundi not visualized due to noncooperation.  Cardiovascular - Regular rate and rhythm.   Neuro - intubated on fentanyl, eyes closed but briefly open on voice, not following commands.  With forced eye opening, eyes in mid position, inconsistently blinking to visual threat, doll's eyes present, not tracking, PERRL. Corneal reflex present, gag and cough present.  Breathing over the vent.  Facial symmetry not able to test due to ET tube.   Tongue protrusion not cooperative.  On pain stimulation, slight withdraw in all extremities.  DTR 1+ and no babinski.  Sensation, coordination and gait not tested.   ASSESSMENT/PLAN [name] is a 75 y.o. male with history of CABG x4, hyperlipidemia, prostate cancer, internal hemorrhoid, and a nonobstructive calculus in right interpole with hematuria presenting with acute onset vertigo nausea vomiting and an inability to walk.  At 10 AM on November 25, 2021 patient developed vertigo nausea and vomiting.  He also noted abdominal discomfort.  A code stroke was called on his arrival to triage.  Once the patient was in the CT scanner he developed more nausea and vomiting he went to a stat MRI which was positive for bilateral cerebellar strokes.  He then developed ataxia in his bilateral upper and lower extremities.  He was then given TNKase and went for a stat CTA of the head.  No LVO was shown on the CTA. Repeat MRI showed progression of edema related to the cerebellar infarcts with mass effect on the 4th ventricle. Neurosurgery consulted and hypertonic saline initiated. 12/13 suboccipital craniectomy overnight.  Patient  tolerated procedure well.  Speech therapy recommends resuming nectar thickened liquids with his diet.  Repeat CT 12/14 shows hemorrhagic transformation of patient's stroke.  Lovenox resumed as he is post 24 hours from  hemorrhagic transformation of his stroke.  Patient's agitation has improved with trial of clonazepam, and his neurological exam has improved.  Palliative care has been consulted to aid in [place] discussions.  Repeat head CT shows no additional bleeding.   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 ASPECTS 10.    Repeat CT-extensive swelling in the areas of the acute cerebellar infarction more extensive on the right than the left.  Mass-effect on the fourth ventricle.  No evidence of ventricular obstruction.  Stable lateral and third ventricles. Post craniectomy CT-Unchanged right greater than left cerebellar infarcts.  No new infarction.  Unchanged narrowing of the fourth ventricle.  No evidence of ventricular obstruction or hydrocephalus. Repeat CT 12/14 New hyperdensity in right cerebellar infarct, likely hemorrhage with no increased mass effect. CTA head & neck unremarkable MRI  acute infarct of the cerebellum bilaterally.  negative for hemorrhage.  MRI Repeat 12/12- Interval progression of edema associated to bilateral cerebellar acute infarcts with mass effect on the fourth ventricle and low lying right cerebellar tonsil. No hydrocephalus Repeat MRI 12/26 CT head 12/14 -right cerebellum hemorrhagic transformation without hydrocephalus CT serial repeats - stable postoperative changes involving infarct resolving intraventricular blood.  No acute abnormalities MRI repeat 12/26 pending 2D Echo EF 70-75% LDL 106 HgbA1c 5.8 VTE prophylaxis - SCDs aspirin 81 mg daily prior to admission, now on aspirin 81, plavix discontinued due to hemorrhagic  transformation of cerebellar stroke. Will consider DOAC once CT stable and po access secured Therapy recommendations: Inpatient rehab- [facility] rehab closer to patients home Disposition: Pending  Cerebellar Edema s/p suboccipital decompression  3% normal saline initiated on 12/12 Sodium goal is 150-155 Na 156 S/p suboccipital decompression with Dr. Zada Finders 3% d/c'd and then resumed due to sodium levels -> now off Close neuro monitoring CT 12/13 reveals unchanged infarcts without evidence of hydrocephalus CT head 12/14 - right cerebellum hemorrhagic transformation without hydrocephalus CT head serial repeats - Stable cerebellar infarct with hemorrhage.  No hydrocephalus or interval change MRI repeat 12/26 pending  Hypertension -> hypotension Home meds: None fluctuate On clonidine 0.1 Q8  BP goal normotensive PRN hydralazine and levophed  Atrial flutter - new diagnosis Cardiology on board As needed metoprolol for rate control Will start IV heparin if no further bleeding seen on today's CT head Consider DOAC once CT stable and po access secured  Fever Leukocytosis Aspiration pneumonia Respiratory distress presumed aspiration pneumonia intubated 12/04/2021,then extubated Reintubated 12/26 due to respiratory distress, aspiration on secretions, tachypnea T-max 101.9-> afebrile now WBC 13.3->13.6->10.0->9.9-> 8.2->6.9 CXR 12/13 - Left lower lobe consolidation with small adjacent left pleural effusion. Findings are likely either due to pneumonia or aspiration. CXR 12/26 - Small left-sided pleural effusion with worsening consolidation at the left base. On unasyn -> rocephin  Hyperlipidemia Home meds: Atorvastatin 80 mg, resumed in hospital LDL 106, goal < 70 Atorvastatin 80 mg daily Continue statin at discharge  Delirium/agitation Encephalopathy Precedex stopped due to bradycardia Fentanyl infusing after intubation Added clonidine yesterday Clonazepam 30m QHS today. Seroquel  stopped due to concern for NMS. MRI 12/26 concerning for b/l caudate tails/splenium restriction - cytotoxic changes - may be able to explain pt delirium/agitation  Correct metabolic underlying cause Avoid significant electrolyte changes, avoid neurotoxic medications Emphasize sleep hygiene  Dysphagia NPO for now MBSS done 12/16 NG placed 12/26- TF infusion  Other Stroke Risk Factors Advanced Age >/= 65  Obesity, Body  mass index is 28.6 kg/m., BMI >/= 30 associated with increased.  Use of oral blood pressure medications.  Risk, recommend weight loss, diet and exercise as appropriate  Coronary artery disease s/p CABG  Other Active Problems Abdominal discomfort CTA abdomen pelvis-no evidence of active GI bleed.  Moderate aortic atherosclerosis.  Diverticulosis with no evidence of diverticulitis. Internal hemorrhoid Nonobstructive calculus in right interpole Hematuria one month ago Flomax DC'd and changed to Urecholine 10 mg 3 times daily  Hospital day # 15  Patient seen by NP/APP with MD. MD to update note as needed.   Janine Ores, DNP, FNP-BC [facility] Pager: [phone]  ATTENDING NOTE: I reviewed above note and agree with the assessment and plan. Pt was seen and examined.   Met wife, wife's sister, and pt sister at the bedside. Pt was re-intubated early this am due to respiratory distress. Concerning for aspiration as current respiratory aspiration from ET showed content of TF. CXR earlier today showed NG tube in the mid esophagus. As this is the 3rd intubation so far (previously for IR procedure, agitation and aspiration), will need to consider potential trach and PEG. Discussed with wife, will repeat MRI brain to make sure no new intracranial findings. If negative, may consider trach and PEG soon to ensure medication and tube feeding.    Temp:  [98.2 F (36.8 C)-98.6 F (37 C)] 98.6 F (37 C) (12/26 1200) Pulse Rate:  [48-72] 48 (12/26 1400) Resp:   [14-55] 16 (12/26 1400) BP: (102-189)/(52-102) 126/62 (12/26 1409) SpO2:  [92 %-100 %] 100 % (12/26 1400) FiO2 (%):  [50 %-100 %] 50 % (12/26 0850)   General - Well nourished, well developed, intubated on sedation.   Ophthalmologic - fundi not visualized due to noncooperation.   Cardiovascular - Regular rate and rhythm.   Neuro - intubated on fentanyl, eyes closed but briefly open on voice, not following commands. With forced eye opening, eyes in mid position, inconsistently blinking to visual threat, doll's eyes present, not tracking, PERRL. Corneal reflex present, gag and cough present. Breathing over the vent.  Facial symmetry not able to test due to ET tube.  Tongue protrusion not cooperative. On pain stimulation, slight withdraw in all extremities. DTR 1+ and no babinski. Sensation, coordination and gait not tested.   For detailed assessment and plan, please refer to above as I have made changes wherever appropriate.   This patient is critically ill due to respiratory distress, stroke with hemorrhagic conversion, a flutter, dysphagia, aspiration pneumonia, agitation/delirium and at significant risk of neurological worsening, death form respiratory failure, sepsis, seizure, heart failure. This patient's care requires constant monitoring of vital signs, hemodynamics, respiratory and cardiac monitoring, review of multiple databases, neurological assessment, discussion with family, other specialists and medical decision making of high complexity. I spent 50 minutes of neurocritical care time in the care of this patient. I had long discussion with wife, patient's sister at bedside, updated pt current condition, treatment plan and potential prognosis, and answered all the questions.  They expressed understanding and appreciation.  I also discussed with Dr. Verlee Monte CCM.   Rosalin Hawking, MD PhD Stroke Neurology 12/10/2021 6:22 PM   ADDENDUM: MRI reviewed preliminary showed stable right cerebellum  infarct with hemorrhagic transformation, much improved brainstem compression, fourth ventricle now only minimal compression.  However, MRI did show abnormal restriction signal at bilateral caudate tails and splenium, concerning for cytotoxic changes with broad differential but most likely due to metabolic etiology.  One possibility would be patient  fluctuation of sodium levels over the last 2 weeks.  Currently off 3%, sodium stable.  Discussed with Dr. Verlee Monte CCM, would proceed with tracheostomy for further supportive care.  Rosalin Hawking, MD PhD Stroke Neurology 12/10/2021 9:28 PM   To contact Stroke Continuity provider, please refer to [email or web address] After hours, contact General Neurology

## 2021-12-10 NOTE — Procedures (Addendum)
Intubation Procedure Note  [name]  [id number]  10-30-1946  Date:12/10/21  Time:6:17 AM    I was present and supervised the entire procedure. Glenna Fellows MD  Provider Performing:Timothy Cleon Dew    Procedure: Intubation (40981)  Indication(s) Respiratory Failure  Consent Unable to obtain consent due to emergent nature of procedure.   Anesthesia Etomidate and Rocuronium   Time Out Verified patient identification, verified procedure, site/side was marked, verified correct patient position, special equipment/implants available, medications/allergies/relevant history reviewed, required imaging and test results available.   Sterile Technique Usual hand hygeine, masks, and gloves were used   Procedure Description Patient positioned in bed supine.  Sedation given as noted above.  Patient was intubated with endotracheal tube using Glidescope.  View was Grade 1 full glottis .  Number of attempts was 1.  Colorimetric CO2 detector was consistent with tracheal placement.  Glidescope size 4.  Complications/Tolerance None; patient tolerated the procedure well. Chest X-ray is ordered to verify placement.   EBL Minimal   Specimen(s) None  Redmond School., MSN, APRN, AGACNP-BC [facility]  12/10/2021 , 6:18 AM  Please see [email or web address] for pager details  If no response, please call [phone] After hours, please call Elink at [phone]

## 2021-12-10 NOTE — Procedures (Signed)
Patient Name: [name]  MRN: [id number]  Epilepsy Attending: Lora Havens  Referring Physician/Provider: Maryjane Hurter, MD Date: 12/10/2021 Duration: 23.13 mins  Patient history: 75 year old male with bilateral cerebellar infarct, noted to have movements concerning for seizure.  EEG to evaluate for seizure.  Level of alertness: lethargic   AEDs during EEG study: None  Technical aspects: This EEG study was done with scalp electrodes positioned according to the 10-20 International system of electrode placement. Electrical activity was acquired at a sampling rate of 500Hz  and reviewed with a high frequency filter of 70Hz  and a low frequency filter of 1Hz . EEG data were recorded continuously and digitally stored.   Description: EEG showed continuous generalized 3 to 6 Hz theta-delta slowing. Hyperventilation and photic stimulation were not performed.     ABNORMALITY - Continuous slow, generalized  IMPRESSION: This study is suggestive of moderate to severe diffuse encephalopathy, nonspecific etiology. No seizures or epileptiform discharges were seen throughout the recording.  If suspicion for ictal-interictal activity remains a concern, a prolonged study including sleep can be considered.   Butch Otterson Barbra Sarks

## 2021-12-10 NOTE — Progress Notes (Signed)
PT Cancellation Note  Patient Details Name: [name] MRN: [id number] DOB: 1946/03/19   Cancelled Treatment:    Reason Eval/Treat Not Completed: Medical issues which prohibited therapy. Pt re-intubated this morning. Acute PT to hold at this time and return as able, as appropriate to progress mobility.  Kittie Plater, PT, DPT Acute Rehabilitation Services Pager #: [phone] Office #: [phone]    Berline Lopes 12/10/2021, 10:30 AM

## 2021-12-10 NOTE — Progress Notes (Signed)
[facility] Progress Note Patient Name: [name] DOB: 12/28/1945 MRN: [id number]   Date of Service  12/10/2021  HPI/Events of Note  Notified of increased work of breathing Audible rhonchi and appears to have upper airway secretions with minimal secretions suctioned per NT. Poor cough  eICU Interventions  Instructed bedside team to place nasal airway and suction. Also ordered bronchodilators. Bedside CCM team made aware     Intervention Category Intermediate Interventions: Respiratory distress - evaluation and management  Judd Lien 12/10/2021, 5:43 AM

## 2021-12-10 NOTE — Progress Notes (Signed)
SLP Cancellation Note  Patient Details Name: [name] MRN: [id number] DOB: [birth year]   Cancelled treatment:       Reason Eval/Treat Not Completed: Patient not medically ready. Pt reintubated. Will follow for readiness.    Arriah Wadle, Katherene Ponto 12/10/2021, 9:25 AM

## 2021-12-10 NOTE — Progress Notes (Signed)
Patient transported on vent to MRI and returned to 7E93 without complications.

## 2021-12-10 NOTE — Progress Notes (Signed)
Palliative Medicine Inpatient Follow Up Note  Consulting Provider: Mick Sell, PA-C   Reason for consult:   [facility] Consult  Reason for Consult? family trying to decide if we extubate if he would want to be reintubated and would he have good quality of life    HPI:  Per intake H&P --> [name] is a 75 y.o. male with history of CABG x4, hyperlipidemia, prostate cancer, internal hemorrhoid, and a nonobstructive calculus in right interpole with hematuria presenting with acute onset vertigo nausea vomiting and an inability to walk.  At 10 AM on November 25, 2021 patient developed vertigo nausea and vomiting.  He also noted abdominal discomfort.  A code stroke was called on his arrival to triage.  Once the patient was in the CT scanner he developed more nausea and vomiting he went to a stat MRI which was positive for bilateral cerebellar strokes.  He then developed ataxia in his bilateral upper and lower extremities.  He was then given TNKase and went for a stat CTA of the head.  No LVO was shown on the CTA. Repeat MRI showed progression of edema related to the cerebellar infarcts with mass effect on the 4th ventricle. Neurosurgery consulted and hypertonic saline initiated. 12/13 suboccipital craniectomy overnight.  Patient tolerated procedure well.  Speech therapy recommends resuming nectar thickened liquids with his diet.  Repeat CT 12/14 shows hemorrhagic transformation of patient's stroke.  Lovenox resumed today as he is post 24 hours from hemorrhagic transformation of his stroke.Palliative care was consulted to discuss goals of care in the setting of recent stroke and aspiration event requiring re-intubation.  Today's Discussion (12/10/2021):  *Please note that this is a verbal dictation therefore any spelling or grammatical errors are due to the "[employer]" system interpretation.  Chart reviewed.   I met this morning at [facility] bedside.  I was joined by his wife, Helene Kelp.  We reviewed the events from early this morning which required for [name] to be reintubated.  Discussed his very complicated ongoing delirium and that this could very much so because long-term problems in the setting of patient's rehabilitation journey.  Reviewed the difficulties of Jaryd's prolonged hospital stay and how he is taken 1 step forward and 4 steps backwards.  Helene Kelp shares with me that she is very conflicted regarding what the next step should be.  She asks if the tracheostomy is the right choice.  Dr. Verlee Monte was able to explain the benefits again of the tracheostomy.  He did emphasize that this will not prohibit potential aspirational events but should make it easier for [name] and enable him to be more comfortable.  We reviewed that it would decrease his secretion burden as well.  We discussed that one of the long-term deficits that may be hard to overcome is potentially Williom's inability to swallow.  Discussed this is where gastrostomy tubes come into play.  Helene Kelp is overwhelmed from all of the information for letting her way and asks if I may sit with her for period of time.  I sat with her and allowed for silence while she digested all of the information which she had received.  She shares with me that she was so hopeful we would not be having this conversation and feels distressed by the reality that we are.  Teresa's biggest fear is that Hao is in pain or uncomfortable in any way.  Reviewed the job of the nursing team would  be to ensure that this does not occur irregardless of if her goals are for treatment and care or if her goals are towards comfort.  Rhea Bleacher shares with me that she is going to need more time to process what decision she should make next.  She expresses the want to speak to her family, her sister-in-law, and her son in [place].  Questions and concerns addressed   Palliative support  provided _____________________________________ Addendum:  I was asked to come back to bedside by Helene Kelp.  She asked me to repeat all of the information provided earlier which I was able to do.  I shared with her that this is an impossible decision and no one is expecting her to make it today.  She shares that she does have more questions for the neurology team so that she may make the most educated decision.  I alerted Dr. Erlinda Hong and Almira Bar that Helene Kelp would like to speak with them in greater detail.  They were able to speak with her.  The determination was made for Axyl to have an MRI done this late afternoon.  Depending upon those results and if there is nothing new or acute that is changed we will proceed with tracheostomy and PEG placement.  Helene Kelp is aware that it is a very long road that he will be on  ______________________________________________ Addendum 2:  I was able to send an inquiry to [facility] to obtain a copy of [place] living well.  This was sent to [place].  A copy has been provided to medical records.  An additional copy has been given to patient's spouse, Helene Kelp for further review.  I called Helene Kelp this evening and shared with her I would not be on service tomorrow though would ask one of my colleagues to follow along.  Objective Assessment: Vital Signs Vitals:   12/10/21 1629 12/10/21 1630  BP: (!) 143/70 (!) 137/57  Pulse: (!) 48 (!) 50  Resp: 18 18  Temp:    SpO2: 100% 98%    Intake/Output Summary (Last 24 hours) at 12/10/2021 1702 Last data filed at 12/10/2021 1600 Gross per 24 hour  Intake 1063.03 ml  Output 1575 ml  Net -511.97 ml    Last Weight  Most recent update: 12/08/2021  3:59 AM    Weight  90.4 kg (199 lb 4.7 oz)            Gen:  Elderly Caucasian M in NAD HEENT: NGT, dry mucous membranes CV: Regular rate and irregular rhythm PULM: Ventilator ABD: soft/nontender EXT: Pedal edema Neuro: On sedation   SUMMARY OF  RECOMMENDATIONS   Full Code / Full Scope  Obtained a copy of patient's living will for review   Patient would not wish to live in a persistent declined state of being   Await MRI results - if nothing new has occurring plan for trach and peg   Will need acute rehabilitation when discharged   Ongoing Palliative Team support - I will not be present tomorrow though I will request one of my colleagues follow up  Time Spent: 96 Greater than 50% of the time was spent in counseling and coordination of care ______________________________________________________________________________________ [place] Team Team Cell Phone: [phone] Please utilize secure chat with additional questions, if there is no response within 30 minutes please call the above phone number  Palliative Medicine Team providers are available by phone from 7am to 7pm daily and can be reached through the  team cell phone.  Should this patient require assistance outside of these hours, please call the patient's attending physician.

## 2021-12-10 NOTE — Procedures (Signed)
Bronchoscopy Procedure Note  [name]  [id number]  20-Jul-1946  Date:12/10/21  Time:6:28 PM   Provider Performing:Ravinder Hofland   Procedure(s):  Flexible Bronchoscopy [phone]) and Initial Therapeutic Aspiration of Tracheobronchial Tree [phone])  Indication(s) Acute respiratory failure  Consent Risks of the procedure as well as the alternatives and risks of each were explained to the patient and/or caregiver.  Consent for the procedure was obtained and is signed in the bedside chart  Anesthesia Etomidate and Vecuronium   Time Out Verified patient identification, verified procedure, site/side was marked, verified correct patient position, special equipment/implants available, medications/allergies/relevant history reviewed, required imaging and test results available.   Sterile Technique Usual hand hygiene, masks, gowns, and gloves were used   Procedure Description Bronchoscope advanced through tracheostomy tube and into airway.  Airways were examined down to subsegmental level with findings noted below.   Following diagnostic evaluation, Therapeutic aspiration performed in right lower lobe  Findings: Copious amount of thick secretions noted in right middle and right lower lobe, suctioned and sent to the lab for culture   Complications/Tolerance None; patient tolerated the procedure well. Chest X-ray is needed post procedure.   EBL Minimal   Specimen(s) Culture was sent to the lab

## 2021-12-10 NOTE — Progress Notes (Signed)
Significant event  Notified by Elink that [employer] appears to be having difficulty handling secretions, with frequent nasal and airway suctioning. Patient receiving duo nebs. Requested to come to bedside to assess  On assessment, patient with tachypnea, audible coarse crackles, on 6L [place], O2 sats mid 90s. Restrained. Some confusion. RASS -1. Patient states he feels tired and short of breath when asked.  Acute respiratory failure with hypoxia Discussed with Dr. Bobby Rumpf. Plan to emergently intubate. -plan to intubate -continue duonebs -LTVV strategy with tidal volumes of 4-8 cc/kg ideal body weight -Goal plateau pressures less than 30 and driving pressures less than 15 -Wean PEEP/FiO2 for SpO2 92-98% -VAP bundle -Daily SAT and SBT -PAD bundle with Precedex gtt and fentanyl push -RASS goal 0 to -1 -Follow up CXR and ABG post intubation  [employer] MSN, APRN, AGACNP-BC [facility]  12/10/2021 , 6:34 AM  Please see [email or web address] for pager details  If no response, please call [phone] After hours, please call Elink at [phone]

## 2021-12-10 NOTE — Progress Notes (Signed)
EEG completed, results pending. 

## 2021-12-11 DIAGNOSIS — I639 Cerebral infarction, unspecified: Secondary | ICD-10-CM | POA: Diagnosis not present

## 2021-12-11 DIAGNOSIS — I4892 Unspecified atrial flutter: Secondary | ICD-10-CM | POA: Diagnosis not present

## 2021-12-11 DIAGNOSIS — Z930 Tracheostomy status: Secondary | ICD-10-CM | POA: Diagnosis not present

## 2021-12-11 DIAGNOSIS — J9602 Acute respiratory failure with hypercapnia: Secondary | ICD-10-CM | POA: Diagnosis not present

## 2021-12-11 LAB — GLUCOSE, CAPILLARY
Glucose-Capillary: 117 mg/dL — ABNORMAL HIGH (ref 70–99)
Glucose-Capillary: 118 mg/dL — ABNORMAL HIGH (ref 70–99)
Glucose-Capillary: 119 mg/dL — ABNORMAL HIGH (ref 70–99)
Glucose-Capillary: 120 mg/dL — ABNORMAL HIGH (ref 70–99)
Glucose-Capillary: 126 mg/dL — ABNORMAL HIGH (ref 70–99)
Glucose-Capillary: 155 mg/dL — ABNORMAL HIGH (ref 70–99)

## 2021-12-11 LAB — CBC
HCT: 35.7 % — ABNORMAL LOW (ref 39.0–52.0)
Hemoglobin: 11.8 g/dL — ABNORMAL LOW (ref 13.0–17.0)
MCH: 31 pg (ref 26.0–34.0)
MCHC: 33.1 g/dL (ref 30.0–36.0)
MCV: 93.7 fL (ref 80.0–100.0)
Platelets: 274 10*3/uL (ref 150–400)
RBC: 3.81 MIL/uL — ABNORMAL LOW (ref 4.22–5.81)
RDW: 14.3 % (ref 11.5–15.5)
WBC: 7.5 10*3/uL (ref 4.0–10.5)
nRBC: 0 % (ref 0.0–0.2)

## 2021-12-11 LAB — BASIC METABOLIC PANEL
Anion gap: 7 (ref 5–15)
BUN: 27 mg/dL — ABNORMAL HIGH (ref 8–23)
CO2: 25 mmol/L (ref 22–32)
Calcium: 7.9 mg/dL — ABNORMAL LOW (ref 8.9–10.3)
Chloride: 114 mmol/L — ABNORMAL HIGH (ref 98–111)
Creatinine, Ser: 0.88 mg/dL (ref 0.61–1.24)
GFR, Estimated: >60 mL/min (ref >60)
Glucose, Bld: 129 mg/dL — ABNORMAL HIGH (ref 70–99)
Potassium: 3.7 mmol/L (ref 3.5–5.1)
Sodium: 146 mmol/L — ABNORMAL HIGH (ref 135–145)

## 2021-12-11 MED ORDER — HYDRALAZINE HCL 20 MG/ML IJ SOLN
10.0000 mg | INTRAMUSCULAR | Status: DC | PRN
Start: 2021-12-11 — End: 2021-12-25
  Administered 2021-12-11: 18:00:00 20 mg via INTRAVENOUS
  Administered 2021-12-12 – 2021-12-13 (×3): 10 mg via INTRAVENOUS
  Administered 2021-12-18: 20 mg via INTRAVENOUS
  Filled 2021-12-11 (×4): qty 1

## 2021-12-11 MED ORDER — LOSARTAN POTASSIUM 50 MG PO TABS
50.0000 mg | ORAL_TABLET | Freq: Every day | ORAL | Status: DC
  Administered 2021-12-11 – 2021-12-25 (×15): 50 mg
  Filled 2021-12-11 (×15): qty 1

## 2021-12-11 MED ORDER — AMLODIPINE BESYLATE 10 MG PO TABS
10.0000 mg | ORAL_TABLET | Freq: Every day | ORAL | Status: DC
  Administered 2021-12-11 – 2021-12-25 (×15): 10 mg
  Filled 2021-12-11 (×15): qty 1

## 2021-12-11 MED ORDER — CLONAZEPAM 0.25 MG PO TBDP
0.5000 mg | ORAL_TABLET | Freq: Two times a day (BID) | ORAL | Status: DC
  Administered 2021-12-11 – 2021-12-13 (×4): 0.5 mg
  Filled 2021-12-11 (×4): qty 2

## 2021-12-11 NOTE — Progress Notes (Signed)
NAME:  [name], MRN:  [id number], DOB:  [birth year], LOS: 75 ADMISSION DATE:  11/25/2021, CONSULTATION DATE:  12/13 REFERRING MD:  Marcelle Overlie, CHIEF COMPLAINT:  Stroke symptoms   History of Present Illness:  75 y/o male admitted with a cerebellar stroke on 12/11, received TNKase, he developed increasing mass effect on the 4th ventricle, moved to the ICU. 12/13 he had a suboccipital craniectomy.  Repeat CT 12/14 showed hemorrhagic conversion.  Pertinent  Medical History  CAD, s/p CABG  HLD  OA,  Nephrolithiasis BPH  Prostate cancer   Significant Hospital Events: Including procedures, antibiotic start and stop dates in addition to other pertinent events   12/11 admission, received TNKase 12/13 worsening edema/mass effect, had emergent craniotomy 12/14 hemorrhagic transformation of stroke 12/20 Finished unasyn for aspiration PNA, re intubated 12/22 Extubated 12/23 Palliative consulted 12/26 Re intubated. Tracheostomy performed, Finished ceftriaxone   Interim History / Subjective:   Weaning on PSV today AM. Opens eyes to command  Objective   Blood pressure (!) 157/65, pulse (!) 54, temperature 99.4 F (37.4 C), temperature source Oral, resp. rate 18, height 5\' 10"  (1.778 m), weight 82.2 kg, SpO2 97 %.    Vent Mode: PRVC FiO2 (%):  [40 %-60 %] 40 % Set Rate:  [18 bmp] 18 bmp Vt Set:  [580 mL] 580 mL PEEP:  [5 cmH20] 5 cmH20 Plateau Pressure:  [1 GXQ11-94 cmH20] 1 cmH20   Intake/Output Summary (Last 24 hours) at 12/11/2021 0810 Last data filed at 12/11/2021 0800 Gross per 24 hour  Intake 3155.3 ml  Output 1050 ml  Net 2105.3 ml   Filed Weights   12/07/21 0334 12/08/21 0359 12/11/21 0409  Weight: 87.1 kg 90.4 kg 82.2 kg    Examination: Gen:      No acute distress HEENT:  EOMI, sclera anicteric Neck:     No masses; no thyromegaly, trach Lungs:    Clear to auscultation bilaterally; normal respiratory effort CV:         Regular rate and rhythm; no murmurs Abd:       + bowel sounds; soft, non-tender; no palpable masses, no distension Ext:    No edema; adequate peripheral perfusion Skin:      Warm and dry; no rash Neuro: Opens eyes to commands  Labs/imaging reviewed Significant for sodium 146, CBC stable.  No new imaging  Resolved problem list: Shock Acute respiratory failure with hypoxia  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 and compression of ventral pons s/p posterior decompression with craniectomy Hemorrhagic conversion of R cerebellar infarct P: -Stroke following - discuss timing of AC challenge -continue ASA; hold plavix due to hemorrhagic conversion -Will need rehab/LTAC post hospital, looking at [facility] inpatient rehab which is close to home  Acute hypoxic respiratory failure, s/p tracheostomy Possible aspiration pneumonia Right lower lobe pneumonia - s/p 7 days Unasyn and 7 days of ceftriaxone P: Vent support, bronchial hygiene Imaging chest x-ray Pressure support weans as tolerated  Dysphagia  - Modified barium swallow showing aspiration, now s/p P: Continue NGT, tentative plan for PEG tube as below Continue tube feeds add fiber supplement for diarrhea  Chronic HFpEF Hypertension, uncontrolled CAD P: Clonidine Carvedilol 12.5 BID on hold in setting of bradycardia Prn hydralazine  Induced hypernatremia - hypertonic now off Hypokalemia Hypocalcemia P: -trend bmp/mag and replete as needed -continue FWF  Hyperactive delirium P: Fentanyl drip.   Continues on clonidine and Klonopin Off Precedex  Proximal atrial flutter P: Coreg on hold  due to bradycardia  BPH P: -Continue Flomax -trend uop  GOC P: Palliative care following.  Remains full code   Best Practice (right click and "Reselect all SmartList Selections" daily)   Diet/type: NPO tube feeds DVT prophylaxis: LMWH GI prophylaxis: PPI Lines: N/A Foley:  N/A Code Status:  full code Last date of multidisciplinary  goals of care discussion [12/26 remains full code. Trach if MRI does not show evidence of new devastating neurologic injury]  Critical care time:    The patient is critically ill with multiple organ system failure and requires high complexity decision making for assessment and support, frequent evaluation and titration of therapies, advanced monitoring, review of radiographic studies and interpretation of complex data.   Critical Care Time devoted to patient care services, exclusive of separately billable procedures, described in this note is 45 minutes.   Marshell Garfinkel MD [facility] See Amion for pager  If no response to pager , please call [phone] until 7pm After 7:00 pm call Elink  [phone] 12/11/2021, 8:30 AM

## 2021-12-11 NOTE — Progress Notes (Signed)
PT Cancellation Note  Patient Details Name: [name] MRN: [id number] DOB: 07-Apr-1946   Cancelled Treatment:    Reason Eval/Treat Not Completed: Medical issues which prohibited therapy (pt weaning and lethargic). Spoke with RN and agreed to hold off until tomorrow.   Leighton Roach, [place]  Pager [phone] Office [place] 12/11/2021, 1:01 PM

## 2021-12-11 NOTE — Progress Notes (Signed)
SLP Cancellation Note  Patient Details Name: [name] MRN: [id number] DOB: 1946-11-25   Cancelled treatment:       Reason Eval/Treat Not Completed: Patient not medically ready. Patient with new tracheostomy. Orders for SLP eval and treat for PMSV and swallowing received. Will follow pt closely for readiness for SLP interventions as appropriate.     Othmar Ringer, Katherene Ponto 12/11/2021, 7:25 AM

## 2021-12-11 NOTE — Progress Notes (Signed)
This chaplain responded to PMT consult for supporting the Pt. wife-[name]. The chaplain is updated by the Pt. RN-Nicole before the visit.  [name] is appreciative of the timing of the visit. The chaplain listened reflectively as [name] talked about her plans for self care as she navigates the Pt. care. The chaplain understands [name] strength is in her faith and a community of friends and family. The chaplain will bring applications and more information for hospital housing to the next visit. The chaplain understands [name] is recognizing the need to spend less time traveling in this period of watchful waiting. [name] requests. "if she is not at the Pt. Bedside, to please call her cell phone."  [name] accepted the chaplain invitation for prayer and F/U spiritual care.  Chaplain Sallyanne Kuster [phone]

## 2021-12-11 NOTE — Consult Note (Signed)
Reason for Consult/Chief Complaint: PEG placement Consultant: Charlean Merl,  NP  [name] is an 75 y.o. male.   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 Consult for PEG placement.   Past Medical History:  Diagnosis Date   CAD (coronary artery disease)    a. s/p CABG in 2012   Hyperlipidemia     Past Surgical History:  Procedure Laterality Date   CORONARY ARTERY BYPASS GRAFT  2012   SUBOCCIPITAL CRANIECTOMY CERVICAL LAMINECTOMY N/A 11/26/2021   Procedure: SUBOCCIPITAL CRANIECTOMY;  Surgeon: Judith Part, MD;  Location: [facility];  Service: Neurosurgery;  Laterality: N/A;    Family History  Problem Relation Age of Onset   Cancer Brother    Coronary artery disease Brother     Social History:  reports that he has quit smoking. His smoking use included cigarettes. He has never used smokeless tobacco. He reports current alcohol use of about 3.0 standard drinks per week. He reports that he does not use drugs.  Allergies: Not on File  Medications: I have reviewed the patient's current medications.  Results for orders placed or performed during the hospital encounter of 11/25/21 (from the past 48 hour(s))  CBC     Status: Abnormal   Collection Time: 12/09/21  2:21 PM  Result Value Ref Range   WBC 6.7 4.0 - 10.5 K/uL   RBC 3.94 (L) 4.22 - 5.81 MIL/uL   Hemoglobin 12.1 (L) 13.0 - 17.0 g/dL   HCT 36.2 (L) 39.0 - 52.0 %   MCV 91.9 80.0 - 100.0 fL   MCH 30.7 26.0 - 34.0 pg   MCHC 33.4 30.0 - 36.0 g/dL   RDW 14.3 11.5 - 15.5 %   Platelets 245 150 - 400 K/uL   nRBC 0.0 0.0 - 0.2 %    Comment: Performed at [facility], [place] [address] [place], [place] [address]  Basic metabolic panel     Status: Abnormal   Collection Time: 12/09/21  2:21 PM  Result Value Ref Range   Sodium 149 (H) 135 - 145 mmol/L   Potassium 3.4 (L) 3.5 - 5.1 mmol/L    Chloride 118 (H) 98 - 111 mmol/L   CO2 24 22 - 32 mmol/L   Glucose, Bld 142 (H) 70 - 99 mg/dL    Comment: Glucose reference range applies only to samples taken after fasting for at least 8 hours.   BUN 25 (H) 8 - 23 mg/dL   Creatinine, Ser 0.82 0.61 - 1.24 mg/dL   Calcium 8.3 (L) 8.9 - 10.3 mg/dL   GFR, Estimated >60 >60 mL/min    Comment: (NOTE) Calculated using the CKD-EPI Creatinine Equation (2021)    Anion gap 7 5 - 15    Comment: Performed at [facility] [address], [place], [place] [address]  Glucose, capillary     Status: Abnormal   Collection Time: 12/09/21  3:37 PM  Result Value Ref Range   Glucose-Capillary 105 (H) 70 - 99 mg/dL    Comment: Glucose reference range applies only to samples taken after fasting for at least 8 hours.  Glucose, capillary     Status: Abnormal   Collection Time: 12/09/21  7:59 PM  Result Value Ref Range   Glucose-Capillary 114 (H) 70 - 99 mg/dL    Comment: Glucose reference range applies only to samples taken after fasting for at least 8  hours.  Glucose, capillary     Status: Abnormal   Collection Time: 12/10/21 12:48 AM  Result Value Ref Range   Glucose-Capillary 129 (H) 70 - 99 mg/dL    Comment: Glucose reference range applies only to samples taken after fasting for at least 8 hours.  Glucose, capillary     Status: Abnormal   Collection Time: 12/10/21  4:02 AM  Result Value Ref Range   Glucose-Capillary 135 (H) 70 - 99 mg/dL    Comment: Glucose reference range applies only to samples taken after fasting for at least 8 hours.  CBC     Status: Abnormal   Collection Time: 12/10/21  6:30 AM  Result Value Ref Range   WBC 6.9 4.0 - 10.5 K/uL   RBC 4.11 (L) 4.22 - 5.81 MIL/uL   Hemoglobin 12.3 (L) 13.0 - 17.0 g/dL   HCT 37.8 (L) 39.0 - 52.0 %   MCV 92.0 80.0 - 100.0 fL   MCH 29.9 26.0 - 34.0 pg   MCHC 32.5 30.0 - 36.0 g/dL   RDW 14.1 11.5 - 15.5 %   Platelets 295 150 - 400 K/uL   nRBC 0.0 0.0 - 0.2 %    Comment: Performed at  [facility], [place] [address] [place], [place] [address]  Basic metabolic panel     Status: Abnormal   Collection Time: 12/10/21  6:30 AM  Result Value Ref Range   Sodium 146 (H) 135 - 145 mmol/L   Potassium 3.6 3.5 - 5.1 mmol/L   Chloride 114 (H) 98 - 111 mmol/L   CO2 24 22 - 32 mmol/L   Glucose, Bld 129 (H) 70 - 99 mg/dL    Comment: Glucose reference range applies only to samples taken after fasting for at least 8 hours.   BUN 19 8 - 23 mg/dL   Creatinine, Ser 0.69 0.61 - 1.24 mg/dL   Calcium 7.9 (L) 8.9 - 10.3 mg/dL   GFR, Estimated >60 >60 mL/min    Comment: (NOTE) Calculated using the CKD-EPI Creatinine Equation (2021)    Anion gap 8 5 - 15    Comment: Performed at [facility] [address] [place], [place] [address]  Glucose, capillary     Status: Abnormal   Collection Time: 12/10/21  7:18 AM  Result Value Ref Range   Glucose-Capillary 126 (H) 70 - 99 mg/dL    Comment: Glucose reference range applies only to samples taken after fasting for at least 8 hours.  I-STAT 7, (LYTES, BLD GAS, ICA, H+H)     Status: Abnormal   Collection Time: 12/10/21  8:50 AM  Result Value Ref Range   pH, Arterial 7.422 7.350 - 7.450   pCO2 arterial 39.9 32.0 - 48.0 mmHg   pO2, Arterial 132 (H) 83.0 - 108.0 mmHg   Bicarbonate 26.0 20.0 - 28.0 mmol/L   TCO2 27 22 - 32 mmol/L   O2 Saturation 99.0 %   Acid-Base Excess 1.0 0.0 - 2.0 mmol/L   Sodium 148 (H) 135 - 145 mmol/L   Potassium 3.6 3.5 - 5.1 mmol/L   Calcium, Ion 1.18 1.15 - 1.40 mmol/L   HCT 32.0 (L) 39.0 - 52.0 %   Hemoglobin 10.9 (L) 13.0 - 17.0 g/dL   Patient temperature 98.2 F    Collection site RADIAL, ALLEN'S TEST ACCEPTABLE    Drawn by RT    Sample type ARTERIAL   Glucose, capillary     Status: None   Collection Time: 12/10/21  11:28 AM  Result Value Ref Range   Glucose-Capillary 94 70 - 99 mg/dL    Comment: Glucose reference range applies only to samples taken after fasting for at least 8 hours.  Glucose,  capillary     Status: Abnormal   Collection Time: 12/10/21  4:40 PM  Result Value Ref Range   Glucose-Capillary 120 (H) 70 - 99 mg/dL    Comment: Glucose reference range applies only to samples taken after fasting for at least 8 hours.  Culture, Respiratory w Gram Stain     Status: None (Preliminary result)   Collection Time: 12/10/21  6:28 PM   Specimen: Tracheal Aspirate; Respiratory  Result Value Ref Range   Specimen Description TRACHEAL ASPIRATE    Special Requests NONE    Gram Stain      FEW WBC PRESENT,BOTH PMN AND MONONUCLEAR RARE GRAM POSITIVE COCCI    Culture      CULTURE REINCUBATED FOR BETTER GROWTH Performed at [facility], [place] [address], [place], [place] [address]    Report Status PENDING   Glucose, capillary     Status: Abnormal   Collection Time: 12/10/21  7:33 PM  Result Value Ref Range   Glucose-Capillary 103 (H) 70 - 99 mg/dL    Comment: Glucose reference range applies only to samples taken after fasting for at least 8 hours.  Glucose, capillary     Status: Abnormal   Collection Time: 12/10/21 11:30 PM  Result Value Ref Range   Glucose-Capillary 107 (H) 70 - 99 mg/dL    Comment: Glucose reference range applies only to samples taken after fasting for at least 8 hours.  CBC     Status: Abnormal   Collection Time: 12/11/21  2:09 AM  Result Value Ref Range   WBC 7.5 4.0 - 10.5 K/uL   RBC 3.81 (L) 4.22 - 5.81 MIL/uL   Hemoglobin 11.8 (L) 13.0 - 17.0 g/dL   HCT 35.7 (L) 39.0 - 52.0 %   MCV 93.7 80.0 - 100.0 fL   MCH 31.0 26.0 - 34.0 pg   MCHC 33.1 30.0 - 36.0 g/dL   RDW 14.3 11.5 - 15.5 %   Platelets 274 150 - 400 K/uL   nRBC 0.0 0.0 - 0.2 %    Comment: Performed at [facility], [place] [address] [place], [place] [address]  Basic metabolic panel     Status: Abnormal   Collection Time: 12/11/21  2:09 AM  Result Value Ref Range   Sodium 146 (H) 135 - 145 mmol/L   Potassium 3.7 3.5 - 5.1 mmol/L   Chloride 114 (H) 98 - 111 mmol/L   CO2 25 22 - 32  mmol/L   Glucose, Bld 129 (H) 70 - 99 mg/dL    Comment: Glucose reference range applies only to samples taken after fasting for at least 8 hours.   BUN 27 (H) 8 - 23 mg/dL   Creatinine, Ser 0.88 0.61 - 1.24 mg/dL   Calcium 7.9 (L) 8.9 - 10.3 mg/dL   GFR, Estimated >60 >60 mL/min    Comment: (NOTE) Calculated using the CKD-EPI Creatinine Equation (2021)    Anion gap 7 5 - 15    Comment: Performed at [facility] [address], [place], [place] [address]  Glucose, capillary     Status: Abnormal   Collection Time: 12/11/21  3:32 AM  Result Value Ref Range   Glucose-Capillary 119 (H) 70 - 99 mg/dL    Comment: Glucose reference  range applies only to samples taken after fasting for at least 8 hours.  Glucose, capillary     Status: Abnormal   Collection Time: 12/11/21  7:42 AM  Result Value Ref Range   Glucose-Capillary 126 (H) 70 - 99 mg/dL    Comment: Glucose reference range applies only to samples taken after fasting for at least 8 hours.  Glucose, capillary     Status: Abnormal   Collection Time: 12/11/21 11:14 AM  Result Value Ref Range   Glucose-Capillary 118 (H) 70 - 99 mg/dL    Comment: Glucose reference range applies only to samples taken after fasting for at least 8 hours.    MR BRAIN WO CONTRAST  Result Date: 12/10/2021 CLINICAL DATA:  Acute neurologic deficit EXAM: MRI HEAD WITHOUT CONTRAST TECHNIQUE: Multiplanar, multiecho pulse sequences of the brain and surrounding structures were obtained without intravenous contrast. COMPARISON:  11/26/2021 brain MRI 12/08/2021 head CT FINDINGS: Brain: Mild residual diffusion abnormality in the right cerebellar hemisphere with associated petechial hemorrhage. No new site of acute ischemia. Moderate edema in the right cerebellar hemisphere. Mild generalized volume loss. No hydrocephalus. There is a large portion of the right cerebellum the remains herniated through the craniectomy defect. Vascular: Major flow voids are preserved.  Skull and upper cervical spine: Right suboccipital craniectomy. Sinuses/Orbits:No paranasal sinus fluid levels or advanced mucosal thickening. No mastoid or middle ear effusion. Normal orbits. IMPRESSION: 1. Mild residual diffusion abnormality in the right cerebellar hemisphere with associated petechial hemorrhage. No new site of acute ischemia. 2. Large portion of the right cerebellum remains herniated through the craniectomy defect. Electronically Signed   By: Ulyses Jarred M.D.   On: 12/10/2021 21:31   DG Chest Port 1 View  Result Date: 12/10/2021 CLINICAL DATA:  Tracheostomy EXAM: PORTABLE CHEST 1 VIEW COMPARISON:  12/10/2021, 12/06/2021 FINDINGS: Post sternotomy changes. Esophageal tube tip below the diaphragm but incompletely visualized. Interim placement of tracheostomy tube with tip at the level of mid clavicles, about 4.5 cm superior to carina. Similar small left effusion with slightly worsened basilar airspace disease. Stable cardiomediastinal silhouette with atherosclerosis. IMPRESSION: 1. Tracheostomy tube placement as above. 2. Small left-sided pleural effusion with worsening consolidation at the left base. Electronically Signed   By: Donavan Foil M.D.   On: 12/10/2021 18:49   Portable Chest x-ray  Result Date: 12/10/2021 CLINICAL DATA:  ETT, history of aspiration EXAM: PORTABLE CHEST 1 VIEW COMPARISON:  12/06/2021 FINDINGS: Mild patchy left lower lobe opacity, suspicious for pneumonia. Right lung is clear. Endotracheal tube terminates 4.5 cm above the carina. Enteric tube terminates in the mid esophagus. Advancement into the stomach (roughly 20 cm) is suggested. IMPRESSION: Endotracheal tube terminates 4.5 cm above the carina. Mild patchy left lower lobe opacity, suspicious for pneumonia. Enteric tube terminates in the mid esophagus. Advancement to the stomach (roughly 20 cm) is suggested. Electronically Signed   By: Julian Hy M.D.   On: 12/10/2021 07:05   DG Abd Portable  1V  Result Date: 12/10/2021 CLINICAL DATA:  NG tube placement EXAM: PORTABLE ABDOMEN - 1 VIEW COMPARISON:  Chest radiograph dated 12/10/2021 FINDINGS: Enteric tube terminates in the proximal stomach. Nonobstructive bowel gas pattern. IMPRESSION: Enteric tube terminates in the proximal stomach. Electronically Signed   By: Julian Hy M.D.   On: 12/10/2021 08:02   DG Abd Portable 1V  Result Date: 12/09/2021 CLINICAL DATA:  Enteric tube placement EXAM: PORTABLE ABDOMEN - 1 VIEW COMPARISON:  December 08, 2021 FINDINGS: Incomplete assessment of the pelvis. Enteric tube  side port projects just below the GE junction. Tip projects over the proximal stomach. Bibasilar opacities, likely atelectasis. No dilated loops of bowel are seen. Degenerative changes of the lumbar spine. IMPRESSION: Enteric tube tip projects over the proximal stomach. Electronically Signed   By: Valentino Saxon M.D.   On: 12/09/2021 15:57   EEG adult  Result Date: 12/10/2021 Lora Havens, MD     12/10/2021 10:39 AM Patient Name: [name] MRN: [id number] Epilepsy Attending: Lora Havens Referring Physician/Provider: Maryjane Hurter, MD Date: 12/10/2021 Duration: 23.13 mins Patient history: 75 year old male with bilateral cerebellar infarct, noted to have movements concerning for seizure.  EEG to evaluate for seizure. Level of alertness: lethargic AEDs during EEG study: None Technical aspects: This EEG study was done with scalp electrodes positioned according to the 10-20 International system of electrode placement. Electrical activity was acquired at a sampling rate of 500Hz  and reviewed with a high frequency filter of 70Hz  and a low frequency filter of 1Hz . EEG data were recorded continuously and digitally stored. Description: EEG showed continuous generalized 3 to 6 Hz theta-delta slowing. Hyperventilation and photic stimulation were not performed.   ABNORMALITY - Continuous slow, generalized IMPRESSION: This study  is suggestive of moderate to severe diffuse encephalopathy, nonspecific etiology. No seizures or epileptiform discharges were seen throughout the recording. If suspicion for ictal-interictal activity remains a concern, a prolonged study including sleep can be considered. Priyanka O Yadav    ROS 10 point review of systems is negative except as listed above in HPI.   Physical Exam Blood pressure (!) 180/79, pulse 65, temperature 99.3 F (37.4 C), temperature source Axillary, resp. rate (!) 23, height 5\' 10"  (1.778 m), weight 82.2 kg, SpO2 100 %. Constitutional: well-developed, well-nourished HEENT: pupils equal, round, reactive to light, 80mm b/l, moist conjunctiva, external inspection of ears and nose normal, hearing intact Oropharynx: normal oropharyngeal mucosa, poor dentition Neck: no thyromegaly, trachea midline, tracheostomy in good position, unable to assess midline cervical tenderness to palpation Chest: breath sounds equal bilaterally, normal respiratory effort, no midline or lateral chest wall deformity Abdomen: soft, NT, no bruising, no hepatosplenomegaly, no surgical scars visible GU: no blood at urethral meatus of penis, no scrotal masses or abnormality  Back: no wounds Rectal: deferred Extremities: 2+ radial and pedal pulses bilaterally Skin: warm, dry, no rashes Psych: unable to assess     Assessment/Plan: 86M with b/l cerebellar infarcts and dysphagia  Plan for PEG. Informed consent was obtained from the patient's wife after detailed explanation of risks, including bleeding, infection, malposition, dislodgement, injury to intra-abdominal structures, need for conversion to open procedure. biloma, hematoma, injury to common bile duct, need for IOC to delineate anatomy, and need for conversion to open procedure. All questions answered to the satisfaction of the patient's wife. Scheduled for 12/29 at 1415. NPO at midnight that morning. ASA/LMWH okay, no DOAC. Heparin gtt okay and if  started, hold at 0600 on 12/29.    Jesusita Oka, MD General and [place] [facility]

## 2021-12-11 NOTE — Progress Notes (Signed)
Inpatient Rehab Admissions Coordinator:   Following for my colleague Danne Baxter.  Note reintubated, now with trach and weaning.  She will follow up later this week.    Shann Medal, PT, DPT Admissions Coordinator [phone] 12/11/21  11:22 AM

## 2021-12-11 NOTE — Progress Notes (Addendum)
STROKE TEAM PROGRESS NOTE   INTERVAL HISTORY [name] is at the bed side. Repeat MRI showed improvement in the swelling of his brain with an area of abnormal signal suggestive of metabolic encephalopathy. Patient had a tracheostomy placed yesterday Fentanyl being titrated down, increase klonopin to BID starting tomorrow Plan for PEG tube on Thursday.   Vitals:   12/11/21 0600 12/11/21 0700 12/11/21 0800 12/11/21 0835  BP: 122/61 (!) 119/57 (!) 157/65 137/62  Pulse: (!) 53 (!) 54  (!) 56  Resp: 18 18  20   Temp:   99.6 F (37.6 C)   TempSrc:   Axillary   SpO2: 99% 97%  99%  Weight:      Height:       CBC:  Recent Labs  Lab 12/10/21 0630 12/10/21 0850 12/11/21 0209  WBC 6.9  --  7.5  HGB 12.3* 10.9* 11.8*  HCT 37.8* 32.0* 35.7*  MCV 92.0  --  93.7  PLT 295  --  035    Basic Metabolic Panel:  Recent Labs  Lab 12/05/21 0505 12/05/21 1128 12/06/21 0118 12/06/21 0743 12/10/21 0630 12/10/21 0850 12/11/21 0209  NA 159*   < > 156*   < > 146* 148* 146*  K 3.7   < > 3.3*   < > 3.6 3.6 3.7  CL >130*   < > CRITICAL RESULT CALLED TO, READ BACK BY AND VERIFIED WITH:   < > 114*  --  114*  CO2 20*   < > 22   < > 24  --  25  GLUCOSE 107*   < > 134*   < > 129*  --  129*  BUN 30*   < > 37*   < > 19  --  27*  CREATININE 1.16   < > 1.18   < > 0.69  --  0.88  CALCIUM 7.4*   < > 7.5*   < > 7.9*  --  7.9*  MG 2.7*  --  2.8*  --   --   --   --   PHOS 4.0  --   --   --   --   --   --    < > = values in this interval not displayed.    Lipid Panel:  Recent Labs  Lab 12/06/21 0118  TRIG 319*     HgbA1c:  No results for input(s): HGBA1C in the last 168 hours.  Urine Drug Screen:  No results for input(s): LABOPIA, COCAINSCRNUR, LABBENZ, AMPHETMU, THCU, LABBARB in the last 168 hours.   Alcohol Level No results for input(s): ETH in the last 168 hours.  IMAGING past 24 hours MR BRAIN WO CONTRAST  Result Date: 12/10/2021 CLINICAL DATA:  Acute neurologic deficit EXAM: MRI HEAD WITHOUT  CONTRAST TECHNIQUE: Multiplanar, multiecho pulse sequences of the brain and surrounding structures were obtained without intravenous contrast. COMPARISON:  11/26/2021 brain MRI 12/08/2021 head CT FINDINGS: Brain: Mild residual diffusion abnormality in the right cerebellar hemisphere with associated petechial hemorrhage. No new site of acute ischemia. Moderate edema in the right cerebellar hemisphere. Mild generalized volume loss. No hydrocephalus. There is a large portion of the right cerebellum the remains herniated through the craniectomy defect. Vascular: Major flow voids are preserved. Skull and upper cervical spine: Right suboccipital craniectomy. Sinuses/Orbits:No paranasal sinus fluid levels or advanced mucosal thickening. No mastoid or middle ear effusion. Normal orbits. IMPRESSION: 1. Mild residual diffusion abnormality in the right cerebellar hemisphere with associated petechial hemorrhage. No new site  of acute ischemia. 2. Large portion of the right cerebellum remains herniated through the craniectomy defect. Electronically Signed   By: Ulyses Jarred M.D.   On: 12/10/2021 21:31   DG Chest Port 1 View  Result Date: 12/10/2021 CLINICAL DATA:  Tracheostomy EXAM: PORTABLE CHEST 1 VIEW COMPARISON:  12/10/2021, 12/06/2021 FINDINGS: Post sternotomy changes. Esophageal tube tip below the diaphragm but incompletely visualized. Interim placement of tracheostomy tube with tip at the level of mid clavicles, about 4.5 cm superior to carina. Similar small left effusion with slightly worsened basilar airspace disease. Stable cardiomediastinal silhouette with atherosclerosis. IMPRESSION: 1. Tracheostomy tube placement as above. 2. Small left-sided pleural effusion with worsening consolidation at the left base. Electronically Signed   By: Donavan Foil M.D.   On: 12/10/2021 18:49    PHYSICAL EXAM  Temp:  [97.7 F (36.5 C)-99.6 F (37.6 C)] 99.6 F (37.6 C) (12/27 0800) Pulse Rate:  [48-74] 56 (12/27  0835) Resp:  [16-21] 20 (12/27 0835) BP: (82-211)/(42-87) 137/62 (12/27 0835) SpO2:  [96 %-100 %] 99 % (12/27 0835) FiO2 (%):  [40 %] 40 % (12/27 0835) Weight:  [82.2 kg] 82.2 kg (12/27 0409)  General - Well nourished, well developed, tracheostomy in place, fentanyl infusing Ophthalmologic - fundi not visualized due to noncooperation.  Cardiovascular - Regular rate and rhythm. Respiratory- Tracheostomy in place, on ventilator- 40% 7/5  Neuro - Opens eyes to voice, not following commands, still drowsy, sedation is being titrated down. Moving all extremities spontaneously. WTP in all extremities. Smile symmetrical  Sensation, coordination and gait not tested.   ASSESSMENT/PLAN [name] is a 75 y.o. male with history of CABG x4, hyperlipidemia, prostate cancer, internal hemorrhoid, and a nonobstructive calculus in right interpole with hematuria presenting with acute onset vertigo nausea vomiting and an inability to walk.  At 10 AM on November 25, 2021 patient developed vertigo nausea and vomiting.  He also noted abdominal discomfort.  A code stroke was called on his arrival to triage.  Once the patient was in the CT scanner he developed more nausea and vomiting he went to a stat MRI which was positive for bilateral cerebellar strokes.  He then developed ataxia in his bilateral upper and lower extremities.  He was then given TNKase and went for a stat CTA of the head.  No LVO was shown on the CTA. Repeat MRI showed progression of edema related to the cerebellar infarcts with mass effect on the 4th ventricle. Neurosurgery consulted and hypertonic saline initiated. 12/13 suboccipital craniectomy overnight.  Patient tolerated procedure well.  Speech therapy recommends resuming nectar thickened liquids with his diet.  Repeat CT 12/14 shows hemorrhagic transformation of patient's stroke.  Lovenox resumed as he is post 24 hours from hemorrhagic transformation of his stroke.  Patient's agitation has  improved with trial of clonazepam, and his neurological exam has improved.  Palliative care has been consulted to aid in [name] discussions.  Repeat head CT shows no additional bleeding. Repeat MRI shows improved brainstem compression. Abnormal restriction signal at bilateral caudate tails and splenium. Tracheostomy done 12/26. Plan for PEG tube 12/29.  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 ASPECTS 10.    Repeat CT-extensive swelling in the areas of the acute cerebellar infarction more extensive on the right than the left.  Mass-effect on the fourth ventricle.  No evidence of ventricular obstruction.  Stable lateral and third ventricles.  Post craniectomy CT-Unchanged right greater than left cerebellar infarcts.  No new infarction.  Unchanged narrowing of the fourth ventricle.  No evidence of ventricular obstruction or hydrocephalus. Repeat CT 12/14 New hyperdensity in right cerebellar infarct, likely hemorrhage with no increased mass effect. CTA head & neck unremarkable MRI  acute infarct of the cerebellum bilaterally.  negative for hemorrhage.  MRI Repeat 12/12- Interval progression of edema associated to bilateral cerebellar acute infarcts with mass effect on the fourth ventricle and low lying right cerebellar tonsil. No hydrocephalus Repeat MRI 12/26 CT head 12/14 -right cerebellum hemorrhagic transformation without hydrocephalus CT serial repeats - stable postoperative changes involving infarct resolving intraventricular blood.  No acute abnormalities MRI repeat 12/26- stable right cerebellum infarct with hemorrhagic transformation, much improved brainstem compression, fourth ventricle now only minimal compression. abnormal restriction signal at bilateral caudate tails and splenium, concerning for cytotoxic changes 2D Echo EF 70-75% LDL 106 HgbA1c 5.8 VTE  prophylaxis - SCDs aspirin 81 mg daily prior to admission, now on aspirin 81, plavix discontinued due to hemorrhagic transformation of cerebellar stroke. Will consider DOAC once CT stable and po access secured Therapy recommendations: Inpatient rehab- [facility] rehab closer to patients home Disposition: Pending  Cerebellar Edema s/p suboccipital decompression  3% normal saline initiated on 12/12 Sodium goal is 150-155 Na 156 S/p suboccipital decompression with Dr. Zada Finders 3% d/c'd and then resumed due to sodium levels -> now off Close neuro monitoring CT 12/13 reveals unchanged infarcts without evidence of hydrocephalus CT head 12/14 - right cerebellum hemorrhagic transformation without hydrocephalus CT head serial repeats - Stable cerebellar infarct with hemorrhage.  No hydrocephalus or interval change Repeat MRI showed improved brainstem compression, abnormal signal at bilateral caudate tails and splenium  Hypertension -> hypotension Home meds: None fluctuate On clonidine 0.1 Q8  BP goal normotensive PRN hydralazine and levophed  Atrial flutter - new diagnosis Cardiology on board As needed metoprolol for rate control Will start IV heparin if no further bleeding seen on today's CT head Consider DOAC once CT stable and po access secured  Fever Leukocytosis Aspiration pneumonia Respiratory distress presumed aspiration pneumonia intubated 12/04/2021,then extubated Reintubated 12/26 due to respiratory distress, aspiration on secretions, tachypnea T-max 101.9-> afebrile now WBC 13.3->13.6->10.0->9.9-> 8.2->6.9-> 7.5 CXR 12/13 - Left lower lobe consolidation with small adjacent left pleural effusion. Findings are likely either due to pneumonia or aspiration. CXR 12/26 - Small left-sided pleural effusion with worsening consolidation at the left base. On unasyn -> rocephin  Hyperlipidemia Home meds: Atorvastatin 80 mg, resumed in hospital LDL 106, goal < 70 Atorvastatin 80 mg  daily Continue statin at discharge  Delirium/agitation Encephalopathy Precedex stopped due to bradycardia Fentanyl infusing after intubation Klonopin increased to 0.5mg  BID MRI 12/26 concerning for b/l caudate tails/splenium restriction - cytotoxic changes - may be able to explain pt delirium/agitation  Correct metabolic underlying cause Avoid significant electrolyte changes, avoid neurotoxic medications Repeat MRI in 1-2 months at outpt follow up Emphasize sleep hygiene  Dysphagia NPO for now MBSS done 12/16 NG placed 12/26- TF infusion PEG scheduled for Thursday  Other Stroke Risk Factors Advanced Age >/= 65  Obesity, Body mass index is 26 kg/m., BMI >/= 30 associated with increased.  Use of oral blood pressure medications.  Risk, recommend weight loss, diet and exercise as appropriate  Coronary artery disease s/p CABG  Other Active Problems Abdominal discomfort CTA abdomen pelvis-no evidence of active GI bleed.  Moderate aortic atherosclerosis.  Diverticulosis with no evidence of diverticulitis. Internal hemorrhoid Nonobstructive calculus in  right interpole Hematuria one month ago Urecholine 10 mg 3 times daily  Hospital day # 16  Patient seen by NP/APP with MD. MD to update note as needed.   Janine Ores, DNP, FNP-BC [facility] Pager: [phone]  ATTENDING NOTE: I reviewed above note and agree with the assessment and plan. Pt was seen and examined.   [name] at the bedside.  Patient lying in bed, still in restraint, but calm without agitation.  Status post tracheostomy yesterday, plan for PEG tube Thursday.  Still on fentanyl drip, increase Klonopin to twice daily and hoping to wean off fentanyl.  Pending CIR.  For detailed assessment and plan, please refer to above as I have made changes wherever appropriate.   Rosalin Hawking, MD PhD Stroke Neurology 12/11/2021 6:45 PM  This patient is critically ill due to respiratory failure, stroke with  hemorrhagic transformation, a flutter, dysphagia, aspiration pneumonia, agitation/delirium and at significant risk of neurological worsening, death form respite failure, sepsis, septic shock, seizure, heart failure. This patient's care requires constant monitoring of vital signs, hemodynamics, respiratory and cardiac monitoring, review of multiple databases, neurological assessment, discussion with family, other specialists and medical decision making of high complexity. I spent 35 minutes of neurocritical care time in the care of this patient. I had long discussion with [name] at bedside, updated pt current condition, treatment plan and potential prognosis, and answered all the questions.  She expressed understanding and appreciation.      To contact Stroke Continuity provider, please refer to [email or web address] After hours, contact General Neurology

## 2021-12-12 ENCOUNTER — Inpatient Hospital Stay (HOSPITAL_COMMUNITY): Payer: Medicare Other

## 2021-12-12 DIAGNOSIS — I639 Cerebral infarction, unspecified: Secondary | ICD-10-CM | POA: Diagnosis not present

## 2021-12-12 DIAGNOSIS — I4892 Unspecified atrial flutter: Secondary | ICD-10-CM | POA: Diagnosis not present

## 2021-12-12 DIAGNOSIS — J9601 Acute respiratory failure with hypoxia: Secondary | ICD-10-CM | POA: Diagnosis not present

## 2021-12-12 LAB — CBC
HCT: 34.8 % — ABNORMAL LOW (ref 39.0–52.0)
Hemoglobin: 11.2 g/dL — ABNORMAL LOW (ref 13.0–17.0)
MCH: 29.9 pg (ref 26.0–34.0)
MCHC: 32.2 g/dL (ref 30.0–36.0)
MCV: 93 fL (ref 80.0–100.0)
Platelets: 271 10*3/uL (ref 150–400)
RBC: 3.74 MIL/uL — ABNORMAL LOW (ref 4.22–5.81)
RDW: 14.3 % (ref 11.5–15.5)
WBC: 8.1 10*3/uL (ref 4.0–10.5)
nRBC: 0 % (ref 0.0–0.2)

## 2021-12-12 LAB — GLUCOSE, CAPILLARY
Glucose-Capillary: 130 mg/dL — ABNORMAL HIGH (ref 70–99)
Glucose-Capillary: 133 mg/dL — ABNORMAL HIGH (ref 70–99)
Glucose-Capillary: 114 mg/dL — ABNORMAL HIGH (ref 70–99)
Glucose-Capillary: 130 mg/dL — ABNORMAL HIGH (ref 70–99)
Glucose-Capillary: 124 mg/dL — ABNORMAL HIGH (ref 70–99)

## 2021-12-12 LAB — BASIC METABOLIC PANEL
Anion gap: 7 (ref 5–15)
BUN: 16 mg/dL (ref 8–23)
CO2: 26 mmol/L (ref 22–32)
Calcium: 7.7 mg/dL — ABNORMAL LOW (ref 8.9–10.3)
Chloride: 109 mmol/L (ref 98–111)
Creatinine, Ser: 0.73 mg/dL (ref 0.61–1.24)
GFR, Estimated: >60 mL/min (ref >60)
Glucose, Bld: 144 mg/dL — ABNORMAL HIGH (ref 70–99)
Potassium: 3.2 mmol/L — ABNORMAL LOW (ref 3.5–5.1)
Sodium: 142 mmol/L (ref 135–145)

## 2021-12-12 IMAGING — DX DG ABD PORTABLE 1V
2 series · 2 of 2 positions shown · non-contrast
Comparison: Comparison is made with [DATE].

CLINICAL DATA: A 75-year-old male presents for evaluation of
gastric tube placement.

EXAM:
PORTABLE ABDOMEN - 1 VIEW

[abdomen supine (1 of 2)]
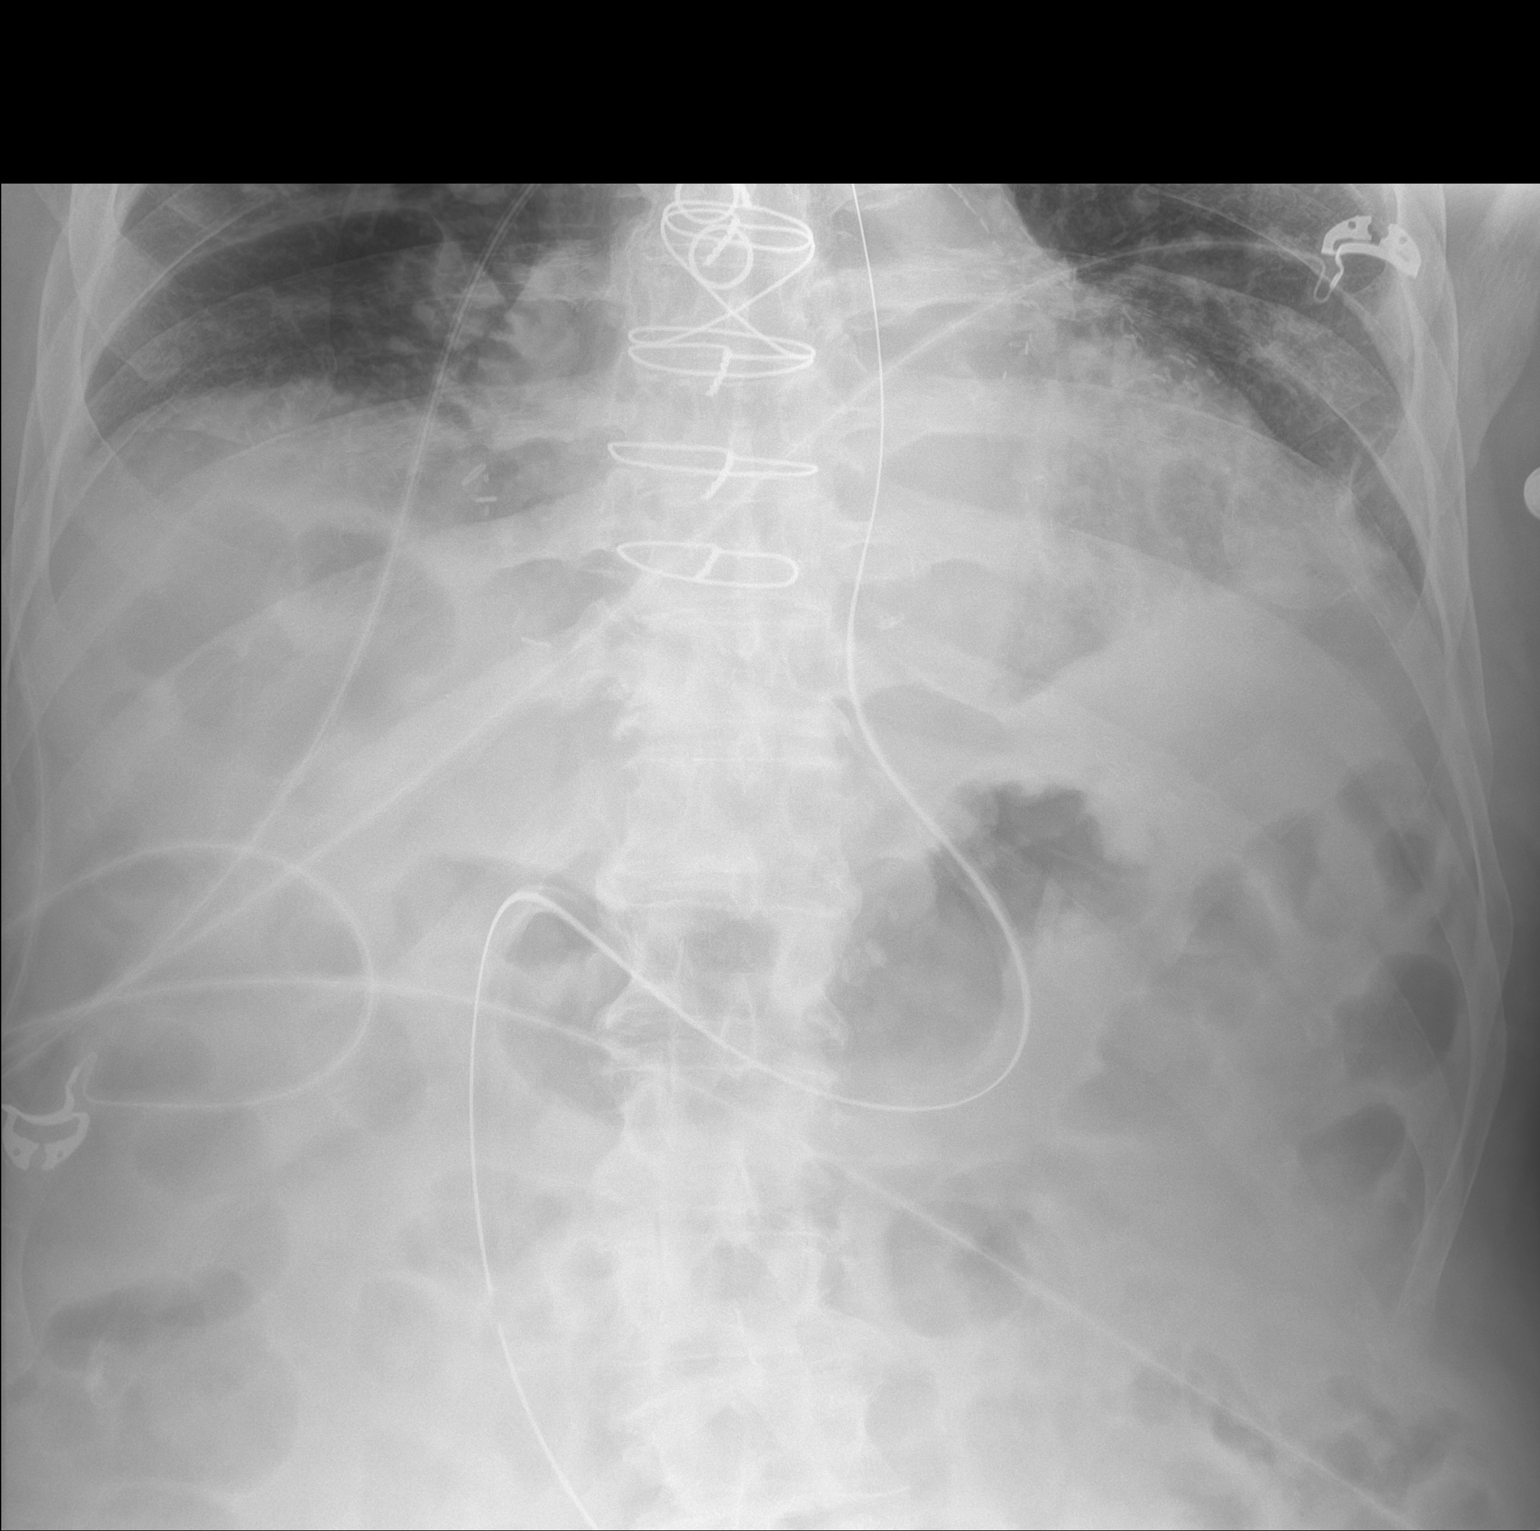

[abdomen supine (2 of 2)]
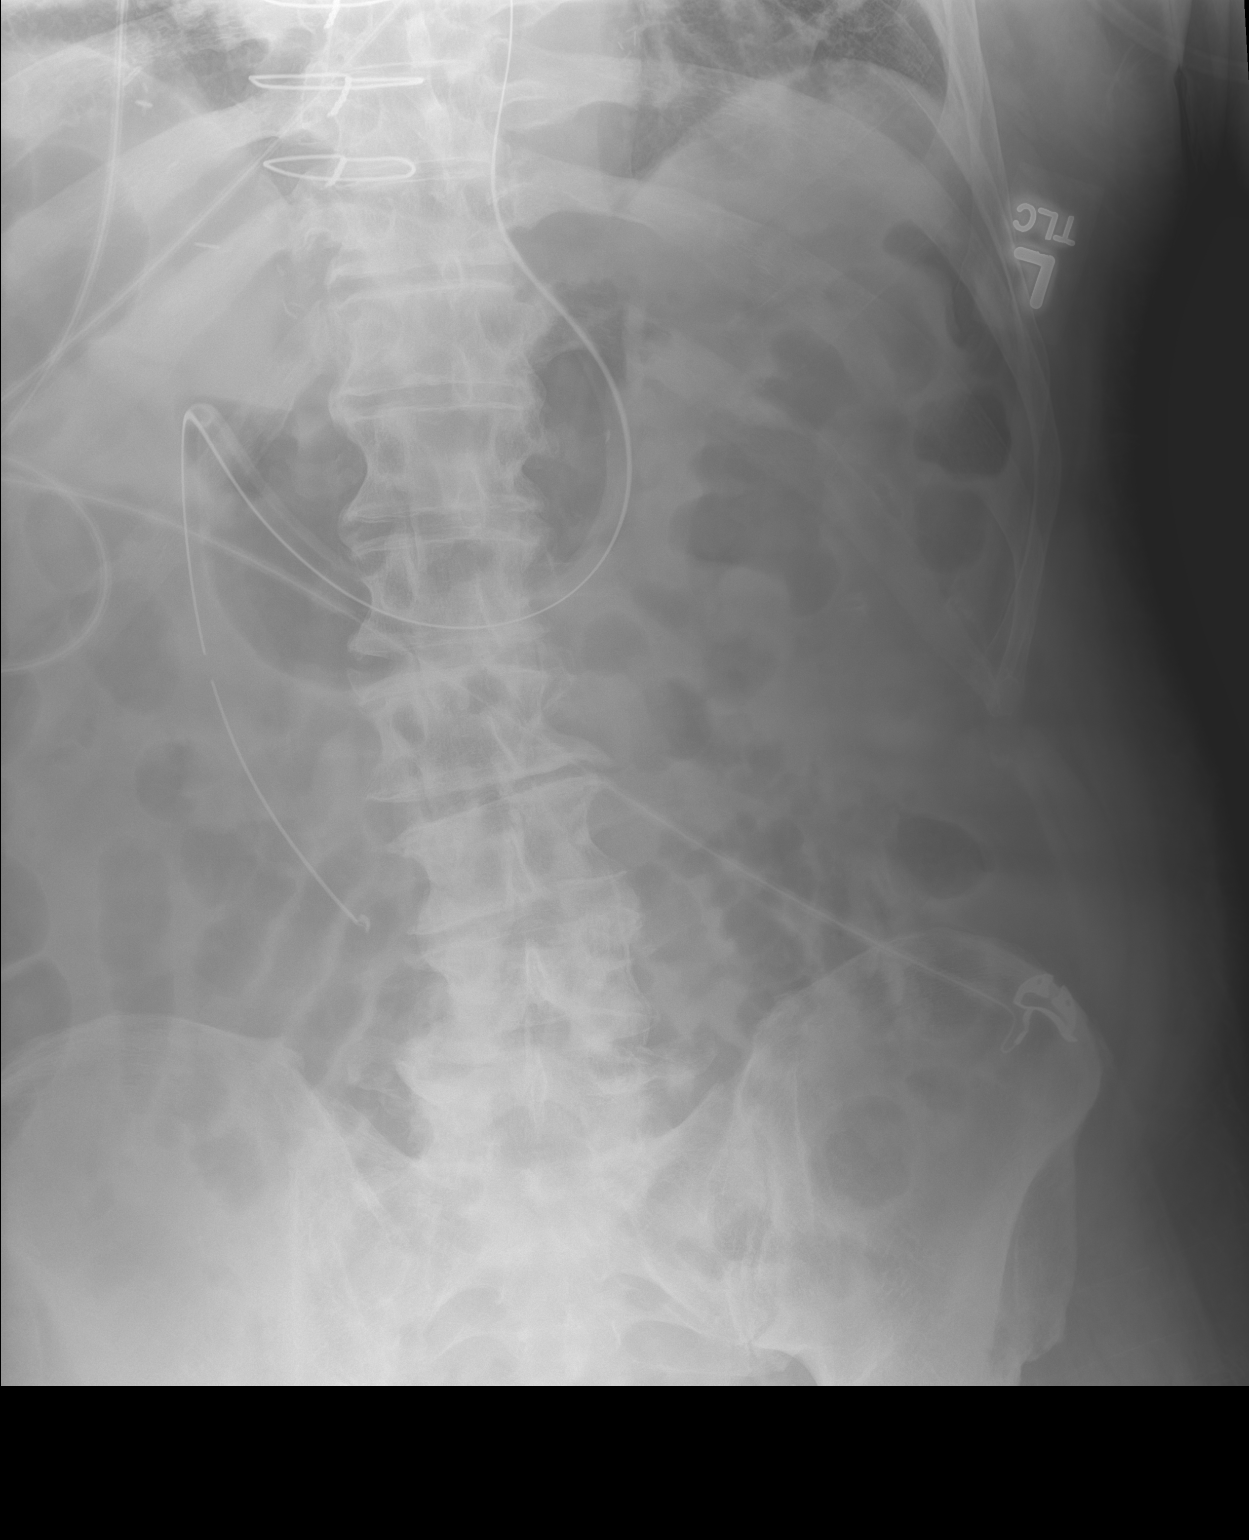

[2 of 2 positions shown; findings below may reference images not displayed]

FINDINGS: The gastric tube tip is now in the distal descending duodenum
approximately 13 cm beyond the gastric antrum.

EKG leads project over the abdomen.

Scattered loops of gas-filled small and large bowel are noted
without substantial distension.

Basilar airspace disease at the lung bases similar to prior imaging.

On limited assessment no acute skeletal process.
IMPRESSION: The gastric tube tip is now in the distal descending duodenum
approximately 13-14 cm beyond the gastric antrum. These results will
be called to the ordering clinician or representative by the
Radiologist Assistant, and communication documented in the PACS or
[REDACTED].

## 2021-12-12 MED ORDER — POTASSIUM CHLORIDE 20 MEQ PO PACK
60.0000 meq | PACK | Freq: Once | ORAL | Status: AC
  Administered 2021-12-12: 12:00:00 60 meq
  Filled 2021-12-12: qty 3

## 2021-12-12 MED ORDER — CHLORHEXIDINE GLUCONATE 0.12 % MT SOLN
15.0000 mL | Freq: Two times a day (BID) | OROMUCOSAL | Status: DC
  Administered 2021-12-12 – 2021-12-25 (×26): 15 mL via OROMUCOSAL
  Filled 2021-12-12 (×24): qty 15

## 2021-12-12 MED ORDER — CARVEDILOL 12.5 MG PO TABS
12.5000 mg | ORAL_TABLET | Freq: Two times a day (BID) | ORAL | Status: DC
  Administered 2021-12-12 – 2021-12-15 (×5): 12.5 mg
  Filled 2021-12-12 (×7): qty 1

## 2021-12-12 MED ORDER — IPRATROPIUM-ALBUTEROL 0.5-2.5 (3) MG/3ML IN SOLN: 3.0000 mL | Freq: Four times a day (QID) | RESPIRATORY_TRACT | Status: DC | PRN

## 2021-12-12 MED ORDER — FREE WATER
250.0000 mL | Freq: Four times a day (QID) | Status: DC
  Administered 2021-12-12 – 2021-12-25 (×50): 250 mL

## 2021-12-12 MED ORDER — OXYCODONE HCL 5 MG PO TABS: 2.5000 mg | ORAL_TABLET | Freq: Four times a day (QID) | ORAL | Status: DC | PRN

## 2021-12-12 MED ORDER — ORAL CARE MOUTH RINSE
15.0000 mL | Freq: Two times a day (BID) | OROMUCOSAL | Status: DC
  Administered 2021-12-12 – 2021-12-25 (×27): 15 mL via OROMUCOSAL

## 2021-12-12 MED ORDER — OLANZAPINE 5 MG PO TBDP
5.0000 mg | ORAL_TABLET | Freq: Two times a day (BID) | ORAL | Status: DC
  Administered 2021-12-12 – 2021-12-13 (×3): 5 mg via SUBLINGUAL
  Filled 2021-12-12 (×4): qty 1

## 2021-12-12 MED ORDER — OXYCODONE HCL 5 MG PO TABS
5.0000 mg | ORAL_TABLET | Freq: Four times a day (QID) | ORAL | Status: DC | PRN
  Administered 2021-12-12 – 2021-12-20 (×5): 5 mg
  Filled 2021-12-12 (×5): qty 1

## 2021-12-12 NOTE — Progress Notes (Signed)
Inpatient Rehabilitation Admissions Coordinator   I continue to follow pt's progress at a distance to assist with planning appropriate rehab venue.  Danne Baxter, RN, MSN Rehab Admissions Coordinator [phone] 12/12/2021 11:05 AM

## 2021-12-12 NOTE — Progress Notes (Addendum)
Nutrition Follow-up ° °DOCUMENTATION CODES:  ° °Not applicable ° °INTERVENTION:  ° °Tube feeding via Cortrak tube: ° °Jevity 1.5 @ 55 ml/hr (1320 ml per day) °Prosource TF 90 ml BID ° °Provides 2140 kcal, 128 gm protein, 1003 ml free water daily ° °Recommend 250 ml free water every 6 hours - spoke with CCM will order  ° °Total free water: 2003 ml ° °NUTRITION DIAGNOSIS:  ° °Inadequate oral intake related to inability to eat as evidenced by NPO status. °Ongoing.  ° °GOAL:  ° °Patient will meet greater than or equal to 90% of their needs °Met with TF at goal.  ° °MONITOR:  ° °Diet advancement, TF tolerance ° °REASON FOR ASSESSMENT:  ° °Consult ° (free water needs) ° °ASSESSMENT:  ° °Pt with PMH of CAD s/p CABG, HLD, OA, nephrolithiasis, BPH, and prostate cancer admitted with cerebellar stroke 12/11 s/p TNK.   ° °Noted plans for PEG placement 12/29.  °Pt currently on trach collar at 40% °Family looking for rehab/LTACH closer to [place].  ° ° °12/11 admitted °12/13 pt with worsening edema s/p emergent crani °12/16 failed MBS, only allowed sips with awake °12/20 pt pulled out cortrak, that evening was re-intubated possible aspiration °12/21 s/p cortrak placement  °12/22 extubated °12/25 NG tube placed  °12/26 re-intubated s/p trach  ° °Medications reviewed: protonix  ° °Labs reviewed: Na 142 ° °Diet Order:   °Diet Order   ° °       °  Diet NPO time specified  Diet effective midnight       °  ° °  °  ° °  ° ° °EDUCATION NEEDS:  ° °Not appropriate for education at this time ° °Skin:  Skin Assessment: Reviewed RN Assessment (closed head incision) ° °Last BM:  12/27 ° °Height:  ° °Ht Readings from Last 1 Encounters:  °11/25/21 5' 10" (1.778 m)  ° ° °Weight:  ° °Wt Readings from Last 1 Encounters:  °12/12/21 89.5 kg  ° ° °BMI:  Body mass index is 28.31 kg/m². ° °Estimated Nutritional Needs:  ° °Kcal:  2100-2300 ° °Protein:  115-125 grams ° °Fluid:  > 2 l/day ° ° P., RD, LDN, CNSC °See AMiON for contact information   ° °

## 2021-12-12 NOTE — Progress Notes (Signed)
Physical Therapy Treatment Patient Details Name: [name] MRN: [id number] DOB: 26-Aug-1946 Today's Date: 12/12/2021   History of Present Illness 75 yo male presenting to ED on 12/11 with ataxia and vertigo. 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 TNK given at 1214. S/p suboccipital crani for evacuation on 12/13. Required intubation 12/20-12/22. 12/26 trach 12/27 trach trials PMH including CAD s/p CABG x4, HLD, OA, nephrolithiasis, urinary retention, BPH with LUTS and prostate cancer.    PT Comments    Patient seen following trach placement. Patient restless and attempting to pull lines/trach/leads with L UE. Demonstrates R sided weakness>L with observation. Following 1 step commands with increased time and difficulty with 2 step commands. NG tube d/c from gravity with HOB elevation and not due to patient, RN aware. Patient placed in chair position but unable to sustain due to patient scooting hips towards EOB and attempting to slide out of bed. Continue to recommend CIR level therapies to assist with maximizing functional mobility and safety.     Recommendations for follow up therapy are one component of a multi-disciplinary discharge planning process, led by the attending physician.  Recommendations may be updated based on patient status, additional functional criteria and insurance authorization.  Follow Up Recommendations  Acute inpatient rehab (3hours/day) (from [facility], would like to go to [facility] rehab)     Assistance Recommended at Discharge Frequent or constant Supervision/Assistance  Equipment Recommendations  Other (comment) (TBD)    Recommendations for Other Services       Precautions / Restrictions Precautions Precautions: Fall Precaution Comments: male purewick, ng tube, trach Restrictions Other Position/Activity Restrictions: Wears a lift in R shoe s/p hip replacement on L causing leg length discrepency     Mobility   Bed Mobility Overal bed mobility: Needs Assistance             General bed mobility comments: total+2 Max (A) to scoot toward [facility] with pad . pt does demonstrate a bridge when pad was being adjusted. pt scooting hips toward edge of chair position and pulling with arms    Transfers                   General transfer comment: not attempted at this time for patient safety and BP 163    Ambulation/Gait                   Stairs             Wheelchair Mobility    Modified Rankin (Stroke Patients Only)       Balance Overall balance assessment: Needs assistance Sitting-balance support: Bilateral upper extremity supported;Feet supported Sitting balance-Leahy Scale: Poor                                      Cognition Arousal/Alertness: Awake/alert Behavior During Therapy: Restless;Impulsive Overall Cognitive Status: Impaired/Different from baseline Area of Impairment: Following commands;Awareness;Problem solving                       Following Commands: Follows one step commands with increased time;Follows multi-step commands inconsistently Safety/Judgement: Decreased awareness of safety;Decreased awareness of deficits   Problem Solving: Slow processing;Difficulty sequencing General Comments: pt pinching therapist nail, attempting to bite when restraint used to keep trach safe, pt restless and attempting to d/c all lines/ leads and trach. pt turning head sharply to  pull off ear pulse ox        Exercises Other Exercises Other Exercises: following simple commands thumbs up , two fingers, Other Exercises: unable to follow touch your knee. pt placing hand on bed rail . second attempting touching head Other Exercises: pt closing eyes with upright posture and requires name call to redirect attention    General Comments General comments (skin integrity, edema, etc.): trach collar BP 176 on arrival. RN giving IV medicaiton and end of  session SBP 147 Pt tolerating trach collar 35% FIO2      Pertinent Vitals/Pain Pain Assessment: Faces Faces Pain Scale: Hurts a little bit Facial Expression: Relaxed, neutral Body Movements: Absence of movements Muscle Tension: Relaxed Compliance with ventilator (intubated pts.): Tolerating ventilator or movement Vocalization (extubated pts.): N/A CPOT Total: 0 Pain Location: generalized Pain Descriptors / Indicators: Restless Pain Intervention(s): Monitored during session;Repositioned    Home Living                          Prior Function            PT Goals (current goals can now be found in the care plan section) Acute Rehab PT Goals PT Goal Formulation: With patient/family Time For Goal Achievement: 12/26/21 Potential to Achieve Goals: Good Progress towards PT goals: Progressing toward goals    Frequency    Min 4X/week      PT Plan Current plan remains appropriate    Co-evaluation PT/OT/SLP Co-Evaluation/Treatment: Yes Reason for Co-Treatment: Complexity of the patient's impairments (multi-system involvement);For patient/therapist safety;To address functional/ADL transfers;Necessary to address cognition/behavior during functional activity PT goals addressed during session: Mobility/safety with mobility OT goals addressed during session: ADL's and self-care;Proper use of Adaptive equipment and DME;Strengthening/ROM      AM-PAC PT "6 Clicks" Mobility   Outcome Measure  Help needed turning from your back to your side while in a flat bed without using bedrails?: A Lot Help needed moving from lying on your back to sitting on the side of a flat bed without using bedrails?: Total Help needed moving to and from a bed to a chair (including a wheelchair)?: Total Help needed standing up from a chair using your arms (e.g., wheelchair or bedside chair)?: Total Help needed to walk in hospital room?: Total Help needed climbing 3-5 steps with a railing? :  Total 6 Click Score: 7    End of Session Equipment Utilized During Treatment: Oxygen Activity Tolerance: Other (comment) (limited by BP parameters and impulsivity) Patient left: in bed;with call bell/phone within reach;with nursing/sitter in room;with restraints reapplied Nurse Communication: Mobility status PT Visit Diagnosis: Unsteadiness on feet (R26.81);Difficulty in walking, not elsewhere classified (R26.2);Ataxic gait (R26.0)     Time: 6378-5885 PT Time Calculation (min) (ACUTE ONLY): 30 min  Charges:  $Therapeutic Activity: 8-22 mins                     Apolonio Cutting A. Gilford Rile PT, DPT Acute Rehabilitation Services Pager [phone] Office [phone]    [name] 12/12/2021, 10:35 AM

## 2021-12-12 NOTE — Progress Notes (Signed)
Palliative:  Chart reviewed. Stopped in patient's room - he is having NG replaced and agitated. RN reports wife is taking the day off from the hospital and remaining at home to take care of some things. Discussed with RN - decided best to allow today for wife to rest. Will not reach out today. Will continue to follow. Plans for PEG 12/29 noted.  Juel Burrow, DNP, AGNP-C Palliative Medicine Team Team Phone # [phone]  Pager # [phone]  NO CHARGE

## 2021-12-12 NOTE — Progress Notes (Signed)
NAME:  [name], MRN:  [id number], DOB:  29-May-1946, LOS: 64 ADMISSION DATE:  11/25/2021, CONSULTATION DATE:  12/13 REFERRING MD:  Marcelle Overlie, CHIEF COMPLAINT:  Stroke symptoms   History of Present Illness:  75 y/o male admitted with a cerebellar stroke on 12/11, received TNKase, he developed increasing mass effect on the 4th ventricle, moved to the ICU. 12/13 he had a suboccipital craniectomy.  Repeat CT 12/14 showed hemorrhagic conversion.  Pertinent  Medical History  CAD, s/p CABG  HLD  OA,  Nephrolithiasis BPH  Prostate cancer   Significant Hospital Events: Including procedures, antibiotic start and stop dates in addition to other pertinent events   12/11 admission, received TNKase 12/13 worsening edema/mass effect, had emergent craniotomy 12/14 hemorrhagic transformation of stroke 12/20 Finished unasyn for aspiration PNA, re intubated 12/22 Extubated 12/23 Palliative consulted 12/26 Re intubated. Tracheostomy performed, Finished ceftriaxone MRI> no new acute ischemia, improvement in brainstem compression, and abnormal signal at bilateral caudate tails and splenium  12/27 trach collar trials   Interim History / Subjective:  On fentanyl 33mcg/hr on exam  On trach collar, 40%  Unable to obtain subjective evaluation due to patient status   Objective   Blood pressure (!) 157/67, pulse 62, temperature 100.1 F (37.8 C), temperature source Axillary, resp. rate 18, height 5\' 10"  (1.778 m), weight 89.5 kg, SpO2 94 %.    Vent Mode: PRVC FiO2 (%):  [40 %] 40 % Set Rate:  [18 bmp] 18 bmp Vt Set:  [580 mL] 580 mL PEEP:  [5 cmH20] 5 cmH20 Pressure Support:  [8 cmH20] 8 cmH20 Plateau Pressure:  [18 cmH20-19 cmH20] 19 cmH20   Intake/Output Summary (Last 24 hours) at 12/12/2021 0856 Last data filed at 12/12/2021 0800 Gross per 24 hour  Intake 1380.08 ml  Output 900 ml  Net 480.08 ml   Filed Weights   12/08/21 0359 12/11/21 0409 12/12/21 0500  Weight: 90.4 kg 82.2 kg 89.5  kg    Examination: General: In bed, NAD, appears comfortable, restless HEENT: MM pink/moist, anicteric, atraumatic, trach in place Neuro: RASS +1, PERRL 1mm, GCS 11t, MAE CV: S1S2, NSR, no m/r/g appreciated PULM: Air movement in all lobes, trachea midline, chest expansion symmetric GI: soft, bsx4 active, non-tender   Extremities: warm/dry, no pretibial edema, capillary refill less than 3 seconds  Skin:  no rashes or lesions noted  Hgb 11.8>11.2 K 3.2 NA 146>142  Assessment & Plan:  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 and compression of ventral pons s/p posterior decompression with craniectomy Hemorrhagic conversion of R cerebellar infarct -Stroke primary. Management per stroke -Continue ASA, plavix held d/t hemorraghic conversion -Repeat MRI in 1-2 months, will need outpatient neurology follow up -Will need rehab/LTAC post hospital, looking at [facility] inpatient rehab which is close to home  Acute hypoxic respiratory failure, s/p tracheostomy Possible aspiration pneumonia Right lower lobe pneumonia - s/p 7 days Unasyn and 7 days of ceftriaxone, 40% trach collar, 94% SPO2 Now on trach collar -Trach collar trials as tolerated, rest on PS -Pulmonary toilet, VAP precautions -Mobilize as able -Scheduled duo nebs  Dysphagia  - Modified barium swallow showing aspiration, now s/p -Plan for PEG with general surgery on 12/29 -continue tube feeds with fiber supplement for diarrhea   Chronic HFpEF Hypertension, uncontrolled CAD HX HLD -Continue clonidine  -Resume Carvedilol 12.5 bid  -Continue amlodipine and losartan -Continue PRN hydralazine  -Continue ASA/Statin  Induced hypernatremia - resolved Hypokalemia Hypocalcemia K 3.2, NA 146>142 P: -Replete -follow bmp -Goal K above  4, goal MG above 2 -Stopping FWF  Hyperactive delirium RASS +1, CAM ICU + for innattention and  -Stop fentanyl gtt, added oxycodone 2.5-5.0mg  q6h prn for pain. -Continue clonidine  and klonopin -Promote normal sleep/wake cycle -delirium precautions -frequent reorientation  Proximal atrial flutter In NSR, bradycardia resolved -Resume coreg as discussed -AC plan per neuro, neuro considering DOAC once PEG is placed and CT stable.  BPH -Continue flowmax -Monitor UOP  GOC -Palliative care following   Best Practice (right click and "Reselect all SmartList Selections" daily)   Diet/type: NPO w/ meds via tube tube feeds DVT prophylaxis: LMWH GI prophylaxis: PPI Lines: N/A Foley:  N/A Code Status:  full code Last date of multidisciplinary goals of care discussion [12/26 remains full code]  Critical care time:  34 minutes   Redmond School., MSN, APRN, AGACNP-BC [facility]  12/12/2021 , 8:56 AM  Please see [email or web address] for pager details  If no response, please call [phone] After hours, please call Elink at [phone]

## 2021-12-12 NOTE — Progress Notes (Signed)
This chaplain attempted F/U visit with the Pt. Wife-[name] to share [employer].    [name] is not at the bedside at the time of the visit. The application and information was given to PMT NP-Shae for delivery.  This chaplain will F/U with [name] and attempt to answer [name]'s questions.  Chaplain Sallyanne Kuster [phone]

## 2021-12-12 NOTE — Progress Notes (Signed)
Occupational Therapy Treatment Patient Details Name: [name] MRN: [id number] DOB: 1945-12-24 Today's Date: 12/12/2021   History of present illness 75 yo male presenting to ED on 12/11 with ataxia and vertigo. 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 TNK given at 1214. S/p suboccipital crani for evacuation on 12/13. Required intubation 12/20-12/22. 12/26 trach 12/27 trach trials PMH including CAD s/p CABG x4, HLD, OA, nephrolithiasis, urinary retention, BPH with LUTS and prostate cancer.   OT comments  Pt very restless this session and following 1 step commands. Pt very quick with L UE to pull at all lines/ leads. Pt with NG tube d/c from gravity with HOB elevation and not due to patient. RN present and aware. Pt tolerates upright chair position in bed but unable to sustain due to pt scooting hips toward edge attempting to exit the bed from foot board. Recommendation for CIR at this time. Pt appears to have R side weakness > L side with BIL UE.    Recommendations for follow up therapy are one component of a multi-disciplinary discharge planning process, led by the attending physician.  Recommendations may be updated based on patient status, additional functional criteria and insurance authorization.    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 pt does demonstratea  bridge when pad was being adjusted. pt scooting hips toward edge of chair position and pulling with arms    Transfers                   General transfer comment: not attempted at this time for patient safety and BP 163     Balance Overall balance assessment: Needs assistance Sitting-balance support: Bilateral upper extremity supported;Feet supported Sitting balance-Leahy Scale: Poor                                     ADL either performed or assessed with clinical judgement   ADL Overall ADL's : Needs assistance/impaired Eating/Feeding: NPO Eating/Feeding Details (indicate cue type and reason): pending peg 12/13/21 per chart review Grooming: Minimal assistance Grooming Details (indicate cue type and reason): needs max (A) to prevent pt from pulling at lines after washing face and wiping hair with wash cloth Upper Body Bathing: Moderate assistance   Lower Body Bathing: Total assistance                         General ADL Comments: using bed in chair due to impulsive and elevated BP on arrival to progress patient. pt with decreased restless behavior initially with up right postuer in bed and produced multiple secretions of bloody secretions    Extremity/Trunk Assessment Upper Extremity Assessment Upper Extremity Assessment: Difficult to assess due to impaired cognition RUE Deficits / Details: AAROM noted but decreased shoulder flexion,  unable to reach face against gravity. pt using L hand to help R hand LUE Deficits / Details: WFL AROM shoulder elbow and wrist. pt pulling at all lines/ lead with L. pt pulling at collar of trach   Lower Extremity Assessment Lower Extremity Assessment: Defer to PT evaluation        Vision   Additional Comments: difficult to assess further at this time due to cognition/ impulsive   Perception     Praxis      Cognition Arousal/Alertness: Awake/alert Behavior During  Therapy: Restless;Impulsive Overall Cognitive Status: Impaired/Different from baseline Area of Impairment: Following commands;Awareness;Problem solving                       Following Commands: Follows one step commands with increased time;Follows multi-step commands inconsistently Safety/Judgement: Decreased awareness of safety;Decreased awareness of deficits   Problem Solving: Slow processing;Difficulty sequencing General Comments: pt pinching therapist nail, attempting to bite when restraint used to keep trach safe, pt restless and attempting to d/c all lines/ leads and trach. pt turning head sharply to pull off ear pulse ox          Exercises Other Exercises Other Exercises: following simple commands thumbs up , two fingers, Other Exercises: unable to follow touch your knee. pt placing hand on bed rail . second attempting touching head Other Exercises: pt closing eyes with upright posture and requires name call to redirect attention   Shoulder Instructions       General Comments trach collar BP 176 on arrival. RN giving IV medicaiton and end of session SBP 147 Pt tolerating trach collar 35% FIO2    Pertinent Vitals/ Pain       Pain Assessment: Faces Faces Pain Scale: Hurts a little bit Facial Expression: Relaxed, neutral Body Movements: Absence of movements Muscle Tension: Relaxed Compliance with ventilator (intubated pts.): Tolerating ventilator or movement Vocalization (extubated pts.): N/A CPOT Total: 0 Pain Location: generalized Pain Descriptors / Indicators: Restless Pain Intervention(s): Monitored during session;Repositioned  Home Living                                          Prior Functioning/Environment              Frequency  Min 2X/week        Progress Toward Goals  OT Goals(current goals can now be found in the care plan section)  Progress towards OT goals: Progressing toward goals  Acute Rehab OT Goals Patient  Stated Goal: trach inflated unable to verbalize OT Goal Formulation: Patient unable to participate in goal setting Time For Goal Achievement: 12/26/21 Potential to Achieve Goals: Good ADL Goals Pt Will Perform Grooming: with set-up;with supervision;sitting Pt Will Perform Lower Body Dressing: with min assist;sit to/from stand Pt Will Transfer to Toilet: with min assist;stand pivot transfer;bedside commode Pt Will Perform Toileting - Clothing Manipulation and hygiene: with min guard assist;sitting/lateral leans  Plan Discharge plan remains appropriate    Co-evaluation    PT/OT/SLP Co-Evaluation/Treatment: Yes Reason for Co-Treatment: Complexity of the patient's impairments (multi-system involvement);Necessary to address cognition/behavior during functional activity;For patient/therapist safety;To address functional/ADL transfers   OT goals addressed during session: ADL's and self-care;Proper use of Adaptive equipment and DME;Strengthening/ROM      AM-PAC OT "6 Clicks" Daily Activity     Outcome Measure   Help from another person eating meals?: A Lot Help from  another person taking care of personal grooming?: A Lot Help from another person toileting, which includes using toliet, bedpan, or urinal?: A Lot Help from another person bathing (including washing, rinsing, drying)?: A Lot Help from another person to put on and taking off regular upper body clothing?: A Lot Help from another person to put on and taking off regular lower body clothing?: Total 6 Click Score: 11    End of Session Equipment Utilized During Treatment: Oxygen  OT Visit Diagnosis: Unsteadiness on feet (R26.81);Other abnormalities of gait and mobility (R26.89);Muscle weakness (generalized) (M62.81);History of falling (Z91.81)   Activity Tolerance Patient tolerated treatment well   Patient Left in bed;with nursing/sitter in room (RN staff present to place NG tube again so no alarm set due to pending placement)    Nurse Communication Mobility status;Precautions        Time: 9937-1696 OT Time Calculation (min): 29 min  Charges: OT General Charges $OT Visit: 1 Visit OT Treatments $Self Care/Home Management : 8-22 mins   Brynn, OTR/L  Acute Rehabilitation Services Pager: [phone] Office: [phone] .   Jeri Modena 12/12/2021, 10:30 AM

## 2021-12-12 NOTE — Progress Notes (Signed)
RT NOTE: patient was placed on CPAP/PSV of 5/5 at 0815.  Due to patient tolerating CPAP/PSV well, while receiving AM nebulizer treatment, at 0830 RT placed patient on 35% ATC and is currently tolerating well.  Will continue to monitor.

## 2021-12-12 NOTE — Progress Notes (Addendum)
STROKE TEAM PROGRESS NOTE   INTERVAL HISTORY Patient is seen in his room with no family at the bedside.  He has been hemodynamically stable, and his neurological exam is stable.   Plan for PEG tube on Thursday. Agitation and delirium continue to be challenging to manage.  Will try ODT olanzapine today along with clonazepam.  Vitals:   12/12/21 1400 12/12/21 1500 12/12/21 1537 12/12/21 1600  BP: (!) 144/69 (!) 148/63  (!) 152/76  Pulse: 71 83 81 83  Resp: (!) 25 (!) 21 (!) 21 (!) 22  Temp:    (!) 100.8 F (38.2 C)  TempSrc:    Axillary  SpO2: 97% 97% 98% 99%  Weight:      Height:       CBC:  Recent Labs  Lab 12/11/21 0209 12/12/21 0337  WBC 7.5 8.1  HGB 11.8* 11.2*  HCT 35.7* 34.8*  MCV 93.7 93.0  PLT 274 176    Basic Metabolic Panel:  Recent Labs  Lab 12/06/21 0118 12/06/21 0743 12/11/21 0209 12/12/21 0337  NA 156*   < > 146* 142  K 3.3*   < > 3.7 3.2*  CL CRITICAL RESULT CALLED TO, READ BACK BY AND VERIFIED WITH:   < > 114* 109  CO2 22   < > 25 26  GLUCOSE 134*   < > 129* 144*  BUN 37*   < > 27* 16  CREATININE 1.18   < > 0.88 0.73  CALCIUM 7.5*   < > 7.9* 7.7*  MG 2.8*  --   --   --    < > = values in this interval not displayed.    Lipid Panel:  Recent Labs  Lab 12/06/21 0118  TRIG 319*     HgbA1c:  No results for input(s): HGBA1C in the last 168 hours.  Urine Drug Screen:  No results for input(s): LABOPIA, COCAINSCRNUR, LABBENZ, AMPHETMU, THCU, LABBARB in the last 168 hours.   Alcohol Level No results for input(s): ETH in the last 168 hours.  IMAGING past 24 hours DG Abd Portable 1V  Result Date: 12/12/2021 CLINICAL DATA:  A 76 year old male presents for evaluation of gastric tube placement. EXAM: PORTABLE ABDOMEN - 1 VIEW COMPARISON:  Comparison is made with December 10, 2021. FINDINGS: The gastric tube tip is now in the distal descending duodenum approximately 13 cm beyond the gastric antrum. EKG leads project over the abdomen. Scattered loops  of gas-filled small and large bowel are noted without substantial distension. Basilar airspace disease at the lung bases similar to prior imaging. On limited assessment no acute skeletal process. IMPRESSION: The gastric tube tip is now in the distal descending duodenum approximately 13-14 cm beyond the gastric antrum. These results will be called to the ordering clinician or representative by the Radiologist Assistant, and communication documented in the PACS or [employer]. Electronically Signed   By: Zetta Bills M.D.   On: 12/12/2021 11:06    PHYSICAL EXAM  Temp:  [99.1 F (37.3 C)-100.8 F (38.2 C)] 100.8 F (38.2 C) (12/28 1600) Pulse Rate:  [56-92] 83 (12/28 1600) Resp:  [16-29] 22 (12/28 1600) BP: (90-185)/(50-101) 152/76 (12/28 1600) SpO2:  [93 %-100 %] 99 % (12/28 1600) FiO2 (%):  [35 %-40 %] 35 % (12/28 1537) Weight:  [89.5 kg] 89.5 kg (12/28 0500)  General - Well nourished, well developed, tracheostomy in place, mildly agitated  Neurological: PERRL, blinks to threat bilaterally, tongue midline, able to move all four extremities in response to  commands.   ASSESSMENT/PLAN [name] is a 75 y.o. male with history of CABG x4, hyperlipidemia, prostate cancer, internal hemorrhoid, and a nonobstructive calculus in right interpole with hematuria presenting with acute onset vertigo nausea vomiting and an inability to walk.  At 10 AM on November 25, 2021 patient developed vertigo nausea and vomiting.  He also noted abdominal discomfort.  A code stroke was called on his arrival to triage.  Once the patient was in the CT scanner he developed more nausea and vomiting he went to a stat MRI which was positive for bilateral cerebellar strokes.  He then developed ataxia in his bilateral upper and lower extremities.  He was then given TNKase and went for a stat CTA of the head.  No LVO was shown on the CTA. Repeat MRI showed progression of edema related to the cerebellar infarcts with  mass effect on the 4th ventricle. Neurosurgery consulted and hypertonic saline initiated. 12/13 suboccipital craniectomy overnight.  Patient tolerated procedure well.  Speech therapy recommends resuming nectar thickened liquids with his diet.  Repeat CT 12/14 shows hemorrhagic transformation of patient's stroke.  Lovenox resumed as he is post 24 hours from hemorrhagic transformation of his stroke.  Patient's agitation has improved with trial of clonazepam, and his neurological exam has improved.  Palliative care has been consulted to aid in [place] discussions.  Repeat head CT shows no additional bleeding. Repeat MRI shows improved brainstem compression. Abnormal restriction signal at bilateral caudate tails and splenium. Tracheostomy done 12/26. Plan for PEG tube 12/29.  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 ASPECTS 10.    Repeat CT-extensive swelling in the areas of the acute cerebellar infarction more extensive on the right than the left.  Mass-effect on the fourth ventricle.  No evidence of ventricular obstruction.  Stable lateral and third ventricles. Post craniectomy CT-Unchanged right greater than left cerebellar infarcts.  No new infarction.  Unchanged narrowing of the fourth ventricle.  No evidence of ventricular obstruction or hydrocephalus. Repeat CT 12/14 New hyperdensity in right cerebellar infarct, likely hemorrhage with no increased mass effect. CTA head & neck unremarkable MRI  acute infarct of the cerebellum bilaterally.  negative for hemorrhage.  MRI Repeat 12/12- Interval progression of edema associated to bilateral cerebellar acute infarcts with mass effect on the fourth ventricle and low lying right cerebellar tonsil. No hydrocephalus Repeat MRI 12/26 CT head 12/14 -right cerebellum hemorrhagic  transformation without hydrocephalus CT serial repeats - stable postoperative changes involving infarct resolving intraventricular blood.  No acute abnormalities MRI repeat 12/26- stable right cerebellum infarct with hemorrhagic transformation, much improved brainstem compression, fourth ventricle now only minimal compression. abnormal restriction signal at bilateral caudate tails and splenium, concerning for cytotoxic changes 2D Echo EF 70-75% LDL 106 HgbA1c 5.8 VTE prophylaxis - SCDs aspirin 81 mg daily prior to admission, now on aspirin 81, plavix discontinued due to hemorrhagic transformation of cerebellar stroke. Will consider DOAC once CT stable and po access secured Therapy recommendations: Inpatient rehab- [facility] rehab closer to patients home Disposition: Pending  Cerebellar Edema s/p suboccipital decompression  3% normal saline initiated on 12/12 Sodium goal is 150-155 Na 156 S/p suboccipital decompression with Dr. Zada Finders 3% d/c'd and then resumed due to sodium levels -> now off Close neuro monitoring CT 12/13 reveals unchanged infarcts without evidence of hydrocephalus CT head 12/14 - right  cerebellum hemorrhagic transformation without hydrocephalus CT head serial repeats - Stable cerebellar infarct with hemorrhage.  No hydrocephalus or interval change Repeat MRI showed improved brainstem compression, abnormal signal at bilateral caudate tails and splenium  Hypertension -> hypotension Home meds: None fluctuate On clonidine 0.1 Q8  BP goal normotensive PRN hydralazine and levophed  Atrial flutter - new diagnosis Cardiology on board As needed metoprolol for rate control Will start IV heparin if no further bleeding seen on today's CT head Consider DOAC once CT stable and po access secured  Fever Leukocytosis Aspiration pneumonia Respiratory distress presumed aspiration pneumonia intubated 12/04/2021,then extubated Reintubated 12/26 due to respiratory distress,  aspiration on secretions, tachypnea T-max 101.9-> afebrile->100.8 WBC 13.3->13.6->10.0->9.9-> 8.2->6.9-> 7.5->8.1 CXR 12/13 - Left lower lobe consolidation with small adjacent left pleural effusion. Findings are likely either due to pneumonia or aspiration. CXR 12/26 - Small left-sided pleural effusion with worsening consolidation at the left base. On unasyn -> rocephin  Hyperlipidemia Home meds: Atorvastatin 80 mg, resumed in hospital LDL 106, goal < 70 Atorvastatin 80 mg daily Continue statin at discharge  Delirium/agitation Encephalopathy Precedex stopped due to bradycardia Fentanyl infusing after intubation Klonopin increased to 0.5mg  BID Olanzapine ODT 5 mg BID MRI 12/26 concerning for b/l caudate tails/splenium restriction - cytotoxic changes - may be able to explain pt delirium/agitation  Correct metabolic underlying cause Avoid significant electrolyte changes, avoid neurotoxic medications Repeat MRI in 1-2 months at outpt follow up Emphasize sleep hygiene Consider psychiatry consult if not improved  Dysphagia NPO for now MBSS done 12/16 NG placed 12/26- TF infusion PEG scheduled for tomorrow  Other Stroke Risk Factors Advanced Age >/= 65  Obesity, Body mass index is 28.31 kg/m., BMI >/= 30 associated with increased.  Use of oral blood pressure medications.  Risk, recommend weight loss, diet and exercise as appropriate  Coronary artery disease s/p CABG  Other Active Problems Abdominal discomfort CTA abdomen pelvis-no evidence of active GI bleed.  Moderate aortic atherosclerosis.  Diverticulosis with no evidence of diverticulitis. Internal hemorrhoid Nonobstructive calculus in right interpole Hematuria one month ago Urecholine 10 mg 3 times daily  Hospital day # 17  Patient seen by NP/APP with MD. MD to update note as needed.   [place] , MSN, AGACNP-BC [facility] See Amion for schedule and pager information 12/12/2021 5:04 PM    ATTENDING NOTE: I reviewed above note and agree with the assessment and plan. Pt was seen and examined.   No family at bedside.  Patient awake alert, on bilateral wrist restraint, per RN, patient still agitated and combative, biting RN.  On Klonopin 0.5 twice daily seems not very effective, and on olanzapine 5 mg twice daily.  If not improving, will consider psychiatry consult.  Plan for PEG tube placement tomorrow.  Once p.o. access secured, may consider anticoagulation.  For detailed assessment and plan, please refer to above as I have made changes wherever appropriate.   Rosalin Hawking, MD PhD Stroke Neurology 12/12/2021 9:57 PM  This patient is critically ill due to respiratory failure status post tracheostomy, stroke with hemorrhagic transformation, a flutter, dysphagia, aspiration pneumonia, agitation/combative and at significant risk of neurological worsening, death form respiratory failure, sepsis, septic shock, seizure, heart failure. This patient's care requires constant monitoring of vital signs, hemodynamics, respiratory and cardiac monitoring, review of multiple databases, neurological assessment, discussion with family, other specialists and medical decision making of high complexity. I spent 35 minutes of neurocritical care time in the care of this patient.  I discussed with Dr. Vaughan Browner CCM.    To contact Stroke Continuity provider, please refer to [email or web address] After hours, contact General Neurology

## 2021-12-13 ENCOUNTER — Encounter (HOSPITAL_COMMUNITY): Payer: Self-pay | Admitting: Neurology

## 2021-12-13 ENCOUNTER — Encounter (HOSPITAL_COMMUNITY)
Admission: EM | Disposition: A | Discharge status: Rehabilitation Facility | Payer: Self-pay | Source: Home / Self Care | Attending: Neurology

## 2021-12-13 DIAGNOSIS — I639 Cerebral infarction, unspecified: Secondary | ICD-10-CM | POA: Diagnosis not present

## 2021-12-13 DIAGNOSIS — Z7189 Other specified counseling: Secondary | ICD-10-CM | POA: Diagnosis not present

## 2021-12-13 DIAGNOSIS — I483 Typical atrial flutter: Secondary | ICD-10-CM | POA: Diagnosis not present

## 2021-12-13 DIAGNOSIS — Z515 Encounter for palliative care: Secondary | ICD-10-CM | POA: Diagnosis not present

## 2021-12-13 DIAGNOSIS — Z978 Presence of other specified devices: Secondary | ICD-10-CM

## 2021-12-13 HISTORY — PX: ESOPHAGOGASTRODUODENOSCOPY: SHX5428

## 2021-12-13 HISTORY — PX: PEG PLACEMENT: SHX5437

## 2021-12-13 LAB — BASIC METABOLIC PANEL
Anion gap: 10 (ref 5–15)
BUN: 17 mg/dL (ref 8–23)
CO2: 22 mmol/L (ref 22–32)
Calcium: 7.9 mg/dL — ABNORMAL LOW (ref 8.9–10.3)
Chloride: 108 mmol/L (ref 98–111)
Creatinine, Ser: 0.63 mg/dL (ref 0.61–1.24)
GFR, Estimated: >60 mL/min (ref >60)
Glucose, Bld: 116 mg/dL — ABNORMAL HIGH (ref 70–99)
Potassium: 3.5 mmol/L (ref 3.5–5.1)
Sodium: 140 mmol/L (ref 135–145)

## 2021-12-13 LAB — CBC
HCT: 36.2 % — ABNORMAL LOW (ref 39.0–52.0)
Hemoglobin: 12.1 g/dL — ABNORMAL LOW (ref 13.0–17.0)
MCH: 30.9 pg (ref 26.0–34.0)
MCHC: 33.4 g/dL (ref 30.0–36.0)
MCV: 92.6 fL (ref 80.0–100.0)
Platelets: 298 10*3/uL (ref 150–400)
RBC: 3.91 MIL/uL — ABNORMAL LOW (ref 4.22–5.81)
RDW: 14.6 % (ref 11.5–15.5)
WBC: 8.3 10*3/uL (ref 4.0–10.5)
nRBC: 0 % (ref 0.0–0.2)

## 2021-12-13 LAB — GLUCOSE, CAPILLARY
Glucose-Capillary: 112 mg/dL — ABNORMAL HIGH (ref 70–99)
Glucose-Capillary: 120 mg/dL — ABNORMAL HIGH (ref 70–99)
Glucose-Capillary: 125 mg/dL — ABNORMAL HIGH (ref 70–99)
Glucose-Capillary: 95 mg/dL (ref 70–99)
Glucose-Capillary: 95 mg/dL (ref 70–99)
Glucose-Capillary: 97 mg/dL (ref 70–99)

## 2021-12-13 LAB — CULTURE, RESPIRATORY W GRAM STAIN: Culture: NORMAL

## 2021-12-13 SURGERY — EGD (ESOPHAGOGASTRODUODENOSCOPY)
Anesthesia: Moderate Sedation

## 2021-12-13 MED ORDER — OLANZAPINE 5 MG PO TBDP
5.0000 mg | ORAL_TABLET | Freq: Three times a day (TID) | ORAL | Status: DC
  Administered 2021-12-13 – 2021-12-19 (×16): 5 mg via SUBLINGUAL
  Filled 2021-12-13 (×20): qty 1

## 2021-12-13 MED ORDER — FENTANYL CITRATE (PF) 100 MCG/2ML IJ SOLN
INTRAMUSCULAR | Status: DC | PRN
  Administered 2021-12-13: 100 ug via INTRAVENOUS

## 2021-12-13 MED ORDER — MIDAZOLAM HCL 2 MG/2ML IJ SOLN
INTRAMUSCULAR | Status: AC
  Filled 2021-12-13: qty 4

## 2021-12-13 MED ORDER — MIDAZOLAM HCL 2 MG/2ML IJ SOLN
2.0000 mg | Freq: Once | INTRAMUSCULAR | Status: AC
  Administered 2021-12-13: 15:00:00 2 mg via INTRAVENOUS

## 2021-12-13 MED ORDER — MIDAZOLAM HCL (PF) 5 MG/ML IJ SOLN
INTRAMUSCULAR | Status: DC | PRN
  Administered 2021-12-13: 5 mg via INTRAVENOUS

## 2021-12-13 MED ORDER — MIDAZOLAM HCL (PF) 5 MG/ML IJ SOLN
INTRAMUSCULAR | Status: AC
  Filled 2021-12-13: qty 2

## 2021-12-13 MED ORDER — FENTANYL CITRATE (PF) 100 MCG/2ML IJ SOLN
INTRAMUSCULAR | Status: AC
  Filled 2021-12-13: qty 4

## 2021-12-13 MED ORDER — FENTANYL CITRATE PF 50 MCG/ML IJ SOSY
PREFILLED_SYRINGE | INTRAMUSCULAR | Status: AC
  Filled 2021-12-13: qty 2

## 2021-12-13 MED ORDER — CLONAZEPAM 0.25 MG PO TBDP
0.5000 mg | ORAL_TABLET | Freq: Every day | ORAL | Status: DC
  Administered 2021-12-14 – 2021-12-24 (×11): 0.5 mg
  Filled 2021-12-13 (×11): qty 2

## 2021-12-13 NOTE — Progress Notes (Addendum)
This chaplain responded to PMT referral for picking up the Pt. Wife-[name]'s [employer] application.    The chaplain will email the application to the [place] secretary-Penny. The chaplain educated [name] on the next steps of the process and the possible effect of the holiday weekend.  The Pt. is appreciative of the visit with the chaplain. The Pt. presents herself today as rested and peaceful in the space of anticipating the Pt. procedure. The chaplain understands [name]'s peace is connected to her hope and community of faith.  [name] accepted the chaplain's invitation for prayer and F/U spiritual care. The chaplain understands the Pt. son is arriving from [place] on Saturday.  Chaplain Sallyanne Kuster [phone]

## 2021-12-13 NOTE — Progress Notes (Signed)
PT Cancellation Note  Patient Details Name: [name] MRN: [id number] DOB: Jan 24, 1946   Cancelled Treatment:    Reason Eval/Treat Not Completed: Patient at procedure or test/unavailable Patient currently getting PEG. Will re-attempt as time allows.   [name] PT, DPT Acute Rehabilitation Services Pager [phone] Office [phone]    [name] 12/13/2021, 2:14 PM

## 2021-12-13 NOTE — Progress Notes (Addendum)
STROKE TEAM PROGRESS NOTE   INTERVAL HISTORY Patient is seen in his room with his wife at the bedside.  He has been hemodynamically stable and his neurological exam is stable.  He reportedly slept well last night but continues to have intermittent agitation.  He will receive a PEG tube today.  Vitals:   12/13/21 1100 12/13/21 1109 12/13/21 1200 12/13/21 1206  BP: (!) 156/64   (!) 144/69  Pulse: (!) 55  68 (!) 57  Resp: (!) 28  (!) 22 20  Temp:   98.2 F (36.8 C)   TempSrc:   Axillary   SpO2: 96% 98% 98% 97%  Weight:      Height:       CBC:  Recent Labs  Lab 12/12/21 0337 12/13/21 0417  WBC 8.1 8.3  HGB 11.2* 12.1*  HCT 34.8* 36.2*  MCV 93.0 92.6  PLT 271 237    Basic Metabolic Panel:  Recent Labs  Lab 12/12/21 0337 12/13/21 0417  NA 142 140  K 3.2* 3.5  CL 109 108  CO2 26 22  GLUCOSE 144* 116*  BUN 16 17  CREATININE 0.73 0.63  CALCIUM 7.7* 7.9*    Lipid Panel:  No results for input(s): CHOL, TRIG, HDL, CHOLHDL, VLDL, LDLCALC in the last 168 hours.   HgbA1c:  No results for input(s): HGBA1C in the last 168 hours.  Urine Drug Screen:  No results for input(s): LABOPIA, COCAINSCRNUR, LABBENZ, AMPHETMU, THCU, LABBARB in the last 168 hours.   Alcohol Level No results for input(s): ETH in the last 168 hours.  IMAGING past 24 hours No results found.  PHYSICAL EXAM  Temp:  [98.2 F (36.8 C)-100.8 F (38.2 C)] 98.2 F (36.8 C) (12/29 1200) Pulse Rate:  [53-83] 57 (12/29 1206) Resp:  [11-38] 20 (12/29 1206) BP: (96-170)/(58-89) 144/69 (12/29 1206) SpO2:  [96 %-100 %] 97 % (12/29 1206) FiO2 (%):  [28 %-35 %] 28 % (12/29 1109) Weight:  [90 kg] 90 kg (12/29 0500)  General - Well nourished, well developed, tracheostomy in place, mildly agitated  Neurological: PERRL, blinks to threat bilaterally, tongue midline, able to move all four extremities in response to commands.   ASSESSMENT/PLAN [name] is a 75 y.o. male with history of CABG x4,  hyperlipidemia, prostate cancer, internal hemorrhoid, and a nonobstructive calculus in right interpole with hematuria presenting with acute onset vertigo nausea vomiting and an inability to walk.  At 10 AM on November 25, 2021 patient developed vertigo nausea and vomiting.  He also noted abdominal discomfort.  A code stroke was called on his arrival to triage.  Once the patient was in the CT scanner he developed more nausea and vomiting he went to a stat MRI which was positive for bilateral cerebellar strokes.  He then developed ataxia in his bilateral upper and lower extremities.  He was then given TNKase and went for a stat CTA of the head.  No LVO was shown on the CTA. Repeat MRI showed progression of edema related to the cerebellar infarcts with mass effect on the 4th ventricle. Neurosurgery consulted and hypertonic saline initiated. 12/13 suboccipital craniectomy overnight.  Patient tolerated procedure well.  Speech therapy recommends resuming nectar thickened liquids with his diet.  Repeat CT 12/14 shows hemorrhagic transformation of patient's stroke.  Lovenox resumed as he is post 24 hours from hemorrhagic transformation of his stroke.  Patient's agitation has improved with trial of clonazepam, and his neurological exam has improved.  Palliative care has been consulted  to aid in [place] discussions.  Repeat head CT shows no additional bleeding. Repeat MRI shows improved brainstem compression. Abnormal restriction signal at bilateral caudate tails and splenium. Tracheostomy done 12/26. Plan for PEG tube 12/29.  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 ASPECTS 10.    Repeat CT-extensive swelling in the areas of the acute cerebellar infarction more extensive on the right than the left.  Mass-effect on the fourth ventricle.   No evidence of ventricular obstruction.  Stable lateral and third ventricles. Post craniectomy CT-Unchanged right greater than left cerebellar infarcts.  No new infarction.  Unchanged narrowing of the fourth ventricle.  No evidence of ventricular obstruction or hydrocephalus. Repeat CT 12/14 New hyperdensity in right cerebellar infarct, likely hemorrhage with no increased mass effect. CTA head & neck unremarkable MRI  acute infarct of the cerebellum bilaterally.  negative for hemorrhage.  MRI Repeat 12/12- Interval progression of edema associated to bilateral cerebellar acute infarcts with mass effect on the fourth ventricle and low lying right cerebellar tonsil. No hydrocephalus CT head 12/14 -right cerebellum hemorrhagic transformation without hydrocephalus CT serial repeats - stable postoperative changes involving infarct resolving intraventricular blood.  No acute abnormalities MRI repeat 12/26- stable right cerebellum infarct with hemorrhagic transformation, much improved brainstem compression, fourth ventricle now only minimal compression. abnormal restriction signal at bilateral caudate tails and splenium, concerning for cytotoxic changes 2D Echo EF 70-75% LDL 106 HgbA1c 5.8 VTE prophylaxis - SCDs aspirin 81 mg daily prior to admission, now on aspirin 81, plavix discontinued due to hemorrhagic transformation of cerebellar stroke. Will consider DOAC once CT stable and po access secured Therapy recommendations: Inpatient rehab- [facility] rehab closer to patients home Disposition: Pending  Cerebellar Edema s/p suboccipital decompression  3% normal saline initiated on 12/12 Sodium goal is 150-155 Na 156 S/p suboccipital decompression with Dr. Zada Finders 3% d/c'd and then resumed due to sodium levels -> now off Close neuro monitoring CT 12/13 reveals unchanged infarcts without evidence of hydrocephalus CT head 12/14 - right cerebellum hemorrhagic transformation without hydrocephalus CT head  serial repeats - Stable cerebellar infarct with hemorrhage.  No hydrocephalus or interval change Repeat MRI showed improved brainstem compression, abnormal signal at bilateral caudate tails and splenium  Hypertension -> hypotension Home meds: None fluctuates On clonidine 0.1 Q8  BP goal normotensive PRN hydralazine and levophed  Atrial flutter - new diagnosis Cardiology on board As needed metoprolol for rate control Will start IV heparin if no further bleeding seen on today's CT head Consider DOAC once CT stable and po access secured  Fever Leukocytosis Aspiration pneumonia Respiratory distress presumed aspiration pneumonia intubated 12/04/2021,then extubated Reintubated 12/26 due to respiratory distress, aspiration on secretions, tachypnea T-max 101.9-> afebrile->100.8 WBC 13.3->13.6->10.0->9.9-> 8.2->6.9-> 7.5->8.1-> 8.3 CXR 12/13 - Left lower lobe consolidation with small adjacent left pleural effusion. Findings are likely either due to pneumonia or aspiration. CXR 12/26 - Small left-sided pleural effusion with worsening consolidation at the left base. On unasyn -> rocephin  Hyperlipidemia Home meds: Atorvastatin 80 mg, resumed in hospital LDL 106, goal < 70 Atorvastatin 80 mg daily Continue statin at discharge  Delirium/agitation Encephalopathy Precedex stopped due to bradycardia Fentanyl infusing after intubation Klonopin increased to 0.5mg  QHS Olanzapine ODT 5 mg Q8h MRI 12/26 concerning for b/l caudate tails/splenium restriction - cytotoxic changes - may be able to explain pt delirium/agitation  Correct metabolic  underlying cause Avoid significant electrolyte changes, avoid neurotoxic medications Repeat MRI in 1-2 months at outpt follow up Emphasize sleep hygiene Consider psychiatry consult if not improved  Dysphagia NPO for now MBSS done 12/16 NG placed 12/26- TF infusion PEG scheduled for today  Other Stroke Risk Factors Advanced Age >/= 65  Obesity,  Body mass index is 28.47 kg/m., BMI >/= 30 associated with increased.  Use of oral blood pressure medications.  Risk, recommend weight loss, diet and exercise as appropriate  Coronary artery disease s/p CABG  Other Active Problems Abdominal discomfort CTA abdomen pelvis-no evidence of active GI bleed.  Moderate aortic atherosclerosis.  Diverticulosis with no evidence of diverticulitis. Internal hemorrhoid Nonobstructive calculus in right interpole Hematuria one month ago Urecholine 10 mg 3 times daily  Hospital day # 18  Patient seen by NP/APP with MD. MD to update note as needed.   [place] , MSN, AGACNP-BC [facility] See Amion for schedule and pager information 12/13/2021 12:56 PM   ATTENDING NOTE: I reviewed above note and agree with the assessment and plan. Pt was seen and examined.   Wife at bedside.  Patient drowsy sleepy after olanzapine and clonazepam this morning.  Hard to arouse.  Per RN, he had followed 1 simple commands.  On trach collar, plan to have PEG tube today.  Will increase olanzapine to 5 mg every 8 hours and decrease Klonopin to nighttime only.  May consider psychiatry consult if medication not effective.  Continue tube feeding after PEG tube placement, will transfer out of ICU once stable.  Once p.o. access secured, will start anticoagulation.  I had long discussion with wife at bedside, updated pt current condition, treatment plan and potential prognosis, and answered all the questions.  She expressed understanding and appreciation.   For detailed assessment and plan, please refer to above as I have made changes wherever appropriate.   Rosalin Hawking, MD PhD Stroke Neurology 12/13/2021 7:54 PM    To contact Stroke Continuity provider, please refer to [email or web address] After hours, contact General Neurology

## 2021-12-13 NOTE — TOC Progression Note (Signed)
Transition of Care [facility]) - Progression Note    Patient Details  Name: [name] MRN: [id number] Date of Birth: Dec 22, 1945  Transition of Care [facility]) CM/SW Contact  Ella Bodo, RN Phone Number: 12/13/2021, 3:08 PM  Clinical Narrative:    Left message for Waunita Schooner at [facility]; will continue to follow for medical readiness for CIR. PEG tube placement today.  Family still desires CIR at [facility] in [place], as it is closer to their home.  Will follow pt progress.    Expected Discharge Plan: IP Rehab Facility Barriers to Discharge: Continued Medical Work up  Expected Discharge Plan and Services Expected Discharge Plan: [place]   Discharge Planning Services: CM Consult Post Acute Care Choice: IP Rehab Living arrangements for the past 2 months: Single Family Home                                       Social Determinants of Health (SDOH) Interventions    Readmission Risk Interventions No flowsheet data found.  Reinaldo Raddle, RN, BSN  Trauma/Neuro ICU Case Manager [phone]

## 2021-12-13 NOTE — Progress Notes (Signed)
Daily Progress Note   Patient Name: [name]       Date: 12/13/2021 DOB: Nov 27, 1946  Age: 75 y.o. MRN#: [id number] Attending Physician: Stroke, Md, MD Primary Care Physician: Rogelia Rohrer, MD Admit Date: 11/25/2021  Reason for Consultation/Follow-up: Establishing goals of care  Subjective: Patient resting, appears comfortable, Wife, [name] at bedside. Discussion as below.   Length of Stay: 18  Current Medications: Scheduled Meds:   amLODipine  10 mg Per Tube Daily   aspirin  81 mg Per Tube Daily   atorvastatin  80 mg Per Tube Daily   bethanechol  10 mg Per Tube TID   carvedilol  12.5 mg Per Tube BID WC   chlorhexidine  15 mL Mouth Rinse BID   Chlorhexidine Gluconate Cloth  6 each Topical Daily   clonazepam  0.5 mg Per Tube BID   cloNIDine  0.1 mg Per Tube Q8H   enoxaparin (LOVENOX) injection  40 mg Subcutaneous Daily   feeding supplement (PROSource TF)  90 mL Per Tube BID   fiber  1 packet Per Tube BID   free water  250 mL Per Tube Q6H   losartan  50 mg Per Tube Daily   mouth rinse  15 mL Mouth Rinse q12n4p   OLANZapine zydis  5 mg Sublingual BID   pantoprazole sodium  40 mg Per Tube Daily   tiZANidine  1 mg Per Tube TID    Continuous Infusions:  sodium chloride 250 mL (12/02/21 1930)   sodium chloride     feeding supplement (JEVITY 1.5 CAL/FIBER) Stopped (12/13/21 0000)   lactated ringers Stopped (12/10/21 1750)    PRN Meds: acetaminophen **OR** acetaminophen (TYLENOL) oral liquid 160 mg/5 mL **OR** acetaminophen, butalbital-acetaminophen-caffeine, docusate, hydrALAZINE, ipratropium-albuterol, melatonin, metoprolol tartrate, ondansetron (ZOFRAN) IV, oxyCODONE **OR** oxyCODONE, polyethylene glycol  Physical Exam Constitutional:      Comments: Sleeping, occasional cough noted -  appears agitation better controlled            Vital Signs: BP 117/64    Pulse 68    Temp 98.2 F (36.8 C) (Axillary)    Resp (!) 22    Ht 5\' 10"  (1.778 m)    Wt 90 kg    SpO2 98%    BMI 28.47 kg/m  SpO2: SpO2: 98 % O2 Device: O2 Device: Tracheostomy Collar O2 Flow Rate: O2 Flow Rate (L/min): (S) 5 L/min (done by 8)  Intake/output summary:  Intake/Output Summary (Last 24 hours) at 12/13/2021 1241 Last data filed at 12/13/2021 0500 Gross per 24 hour  Intake 1160 ml  Output 1650 ml  Net -490 ml   LBM: Last BM Date: 12/12/21 Baseline Weight: Weight: 98.1 kg Most recent weight: Weight: 90 kg       Palliative Assessment/Data: PPS 40%    Flowsheet Rows    Flowsheet Row Most Recent Value  Intake Tab   Referral Department Critical care  Unit at Time of Referral ICU  Palliative Care Primary Diagnosis Neurology  Date Notified 12/06/21  Palliative Care Type New Palliative care  Reason for referral Clarify Goals of Care  Date of Admission 11/25/21  Date first seen by Palliative Care 12/06/21  # of days Palliative referral response time  0 Day(s)  # of days IP prior to Palliative referral 11  Clinical Assessment   Psychosocial & Spiritual Assessment   Palliative Care Outcomes        Patient Active Problem List   Diagnosis Date Noted   Acute ischemic stroke 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 Gorman Safi is a 75 y.o. male with history of CABG x4, hyperlipidemia, prostate cancer, internal hemorrhoid, and a nonobstructive calculus in right interpole with hematuria presenting with acute onset vertigo nausea vomiting and an inability to walk.  At 10 AM on November 25, 2021 patient developed vertigo nausea and vomiting.  He  also noted abdominal discomfort.  A code stroke was called on his arrival to triage.  Once the patient was in the CT scanner he developed more nausea and vomiting he went to a stat MRI which was positive for bilateral cerebellar strokes.  He then developed ataxia in his bilateral upper and lower extremities.  He was then given TNKase and went for a stat CTA of the head.  No LVO was shown on the CTA. Repeat MRI showed progression of edema related to the cerebellar infarcts with mass effect on the 4th ventricle. Neurosurgery consulted and hypertonic saline initiated. 12/13 suboccipital craniectomy overnight.  Patient tolerated procedure well.  Speech therapy recommends resuming nectar thickened liquids with his diet.  Repeat CT 12/14 shows hemorrhagic transformation of patient's stroke.  Lovenox resumed today as he is post 24 hours from hemorrhagic transformation of his stroke.Palliative care was consulted to discuss goals of care in the setting of recent stroke and aspiration event requiring re-intubation.  Assessment: Patient is resting well today when I visited - off vent overnight. Wife at bedside. Staff reports agitation better controlled.   Wife tells me of improvement since trach was placed. She also tells me of plans for PEG tube placement later today. She tells me about finding and reviewing patient's advanced directives - she tells me the directives gave her confidence that she has made the right decisions. She does reiterate that patient would not want life prolonged if he were not improving or with some hope of return to some independence. She tells me of the difficulties during hospitalization as just earlier this week she felt patient may be at end of life and now she feels she has seen significant improvement. She continues to appreciate support provided by spiritual care.  We review plan moving forward. Wife hopeful for CIR placement closer to her home. We also briefly reviewed code status -  wife wishes for patient to remain full code for now. She does share that if he were to suffer such as event she would certainly want further goals of care conversations. Wife tells me she feels that she is in a much better place now and happy with plan of care moving forward. She is agreeable to palliative care following along and speaking with her again if any complications arise or with any decline. She also tells me she keeps our number with her and will call with any needs.   Recommendations/Plan: Continue full code/full scope care - confirmed during discussion today Wife feels comfortable with plan, ready for PEG later today Wife  hopeful for CIR placement closer to her home PMT will shadow chart and reach out to wife PRN, wife also has our number to call with any questions/concerns  Code Status: Full code  Care plan was discussed with patient's wife  Thank you for allowing the Palliative Medicine Team to assist in the care of this patient.   Total Time 30 minutes Prolonged Time Billed  no       Greater than 50%  of this time was spent counseling and coordinating care related to the above assessment and plan.  Juel Burrow, DNP, [facility] Palliative Medicine Team Team Phone # [phone]  Pager [phone]

## 2021-12-13 NOTE — Op Note (Signed)
° °  Procedure Note  Date: 12/13/2021  Procedure: esophagogastroduodenoscopy (EGD) and percutaneous endoscopic gastrostomy (PEG) tube placement  Pre-op diagnosis: dysphagia Post-op diagnosis: same  Indication and clinical history: 25M s/p stroke with dysphagia  Surgeon: Jesusita Oka, MD Assistant: Maxwell Caul, [place]  Anesthesia: conscious sedation  Findings: normal esophagus, stomach, and first portion of the duodenum Specimen: none EBL: <5cc Drains/Implants: PEG tube, 4cm at the skin   Disposition: ICU/PACU  Description of Procedure: The patient was positioned semi-recumbent. Time-out was performed verifying correct patient, procedure, signature of informed consent, and pre-operative antibiotics as indicated. MAC induction was uneventful and a bite block was placed into the oropharynx. The endoscope was inserted into the oropharynx and advanced down the esophagus into the stomach and into the duodenum. The visualized esophagus and duodenum were unremarkable. The endoscope was retracted back into the stomach and the stomach was insufflated. The stomach was inspected and was also normal. Transillumination was performed. The light was visible on the external skin and dimpling of the stomach was noted endoscopically with manual pressure. The abdomen was prepped and draped in the usual sterile fashion. Transillumination and dimpling were repeated and local anesthetic was infiltrated to make a skin wheal at the site of transillumination. The needle was inserted perpendicularly to the skin and the tip of the needle was visualized endoscopically. As the needle was retracted, the tract was also anesthetized. A skin nick was made at the site of the wheal and an introducer needle and sheath were inserted. The needle was removed and guidewire inserted. The guidewire was grasped by an endoscopic snare and the snare, guidewire, and endoscope retracted out of the oropharynx. The PEG tube was secured to the  guidewire and retracted through the mouth and esophagus into the stomach. The PEG tube was secured with a bolster and was visualized endoscopically to spin freely circumferentially and also be without gaps between the internal bumper and the stomach wall. There was no evidence of bleeding. The PEG bolster was secured at 4cm at the skin and there were no gaps between the bolster and the abdominal wall. The stomach was desufflated endoscopically and the endoscope removed. The bite block was also removed. The patient tolerated the procedure well and there were no complications.   The patient may have water and medications administered via the PEG tube beginning immediately and tube feeds may be initiated four hours post-procedure.    Jesusita Oka, MD General and [facility]

## 2021-12-13 NOTE — Progress Notes (Signed)
NAME:  [name], MRN:  [id number], DOB:  07-12-1946, LOS: 87 ADMISSION DATE:  11/25/2021, CONSULTATION DATE:  12/13 REFERRING MD:  Marcelle Overlie, CHIEF COMPLAINT:  Stroke symptoms   History of Present Illness:  75 y/o male admitted with a cerebellar stroke on 12/11, received TNKase, he developed increasing mass effect on the 4th ventricle, moved to the ICU. 12/13 he had a suboccipital craniectomy.  Repeat CT 12/14 showed hemorrhagic conversion.  Pertinent  Medical History  CAD, s/p CABG  HLD  OA,  Nephrolithiasis BPH  Prostate cancer   Significant Hospital Events: Including procedures, antibiotic start and stop dates in addition to other pertinent events   12/11 admission, received TNKase 12/13 worsening edema/mass effect, had emergent craniotomy 12/14 hemorrhagic transformation of stroke 12/20 Finished unasyn for aspiration PNA, re intubated 12/22 Extubated 12/23 Palliative consulted 12/26 Re intubated. Tracheostomy performed, Finished ceftriaxone MRI> no new acute ischemia, improvement in brainstem compression, and abnormal signal at bilateral caudate tails and splenium  12/27 trach collar trials 12/28 off vent overnight   Interim History / Subjective:  Agitation decreased overnight  Tmax 100.8,  WBC 8.3  Trach collar 28%, No vent requirements overnight  1.6 L uop, +5.27ml admit, x3 bm  Unable to obtain subjective evaluation due to patient status  Objective   Blood pressure (!) 157/67, pulse 62, temperature 100.1 F (37.8 C), temperature source Axillary, resp. rate 18, height 5\' 10"  (1.778 m), weight 89.5 kg, SpO2 94 %.    FiO2 (%):  [28 %-35 %] 28 %   Intake/Output Summary (Last 24 hours) at 12/13/2021 0843 Last data filed at 12/13/2021 0500 Gross per 24 hour  Intake 1465 ml  Output 1650 ml  Net -185 ml   Filed Weights   12/11/21 0409 12/12/21 0500 12/13/21 0500  Weight: 82.2 kg 89.5 kg 90 kg    Examination: General: In bed, NAD, appears comfortable HEENT:  MM pink/moist, anicteric, atraumatic, trach in place Neuro: RASS -1, PERRL 25mm, MAE, FC CV: S1S2, SB, no m/r/g appreciated PULM:  air movement in all lobes, trachea midline, chest expansion symmetric GI: soft, bsx4 active, non-tender   Extremities: warm/dry, no pretibial edema, capillary refill less than 3 seconds  Skin:  no rashes or lesions noted  Labs HGB 11.2>12.1  Resolved Issues Hypernatremia Hypokalemia   Assessment & Plan:  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 and compression of ventral pons s/p posterior decompression with craniectomy Hemorrhagic conversion of R cerebellar infarct -Stroke team primary, management per stroke. -Continue ASA, plavix held d/t hemorraghic conversion -Repeat MRI in 1-2 months, will need outpatient neurology follow up -Will need rehab/LTAC post hospital, looking at [facility] inpatient rehab which is close to home  Acute hypoxic respiratory failure, s/p tracheostomy Possible aspiration pneumonia Right lower lobe pneumonia - s/p 7 days Unasyn and 7 days of ceftriaxone, Off vent since 10 AM 12/28. -Continue on trach collar -Anticipate need for vent during PEG placement. Aggressive wean of vent back to trach collar after. -Mobilize as able -scheduled duonebs -pulmonary toilet as able -PRN oxy for pain  Dysphagia  - Modified barium swallow showing aspiration, now s/p -PEG planned for 12/29 -PEG management per GS -Resume TF when able  Chronic HFpEF Hypertension, uncontrolled CAD HX HLD -Continue clonidine  -Continue Carvedilol 12.5 bid with hold parameters  -Continue amlodipine and losartan -Continue PRN hydralazine  -Continue ASA/Statin  Hyperactive delirium Agitation improved overnight per nursing -Continue zyprexa 5mg  BID -Continue clonidine -Recommend weaning klonopin -promote normal sleep wake cycle -delirium precautions,  frequent reorientation  Proximal atrial flutter SB on exam -Coreg with appropriate hold  parameters  -AC plan per neuro. Neuro considering DOAC once PEG is placed and CT stable.  BPH -Continue flowmax -monitor UOP  GOC -PC following  Best Practice (right click and "Reselect all SmartList Selections" daily)   Diet/type: NPO w/ meds via tube tube feeds DVT prophylaxis: LMWH GI prophylaxis: PPI Lines: N/A Foley:  N/A Code Status:  full code Last date of multidisciplinary goals of care discussion [12/26 remains full code]  Critical care time: 33 minutes   Redmond School., MSN, APRN, AGACNP-BC [facility]  12/13/2021 , 8:43 AM  Please see [email or web address] for pager details  If no response, please call [phone] After hours, please call Elink at [phone]

## 2021-12-13 NOTE — Progress Notes (Signed)
Trauma/Critical Care Follow Up Note  Subjective:    Overnight Issues:   Objective:  Vital signs for last 24 hours: Temp:  [98.2 F (36.8 C)-100.8 F (38.2 C)] 98.2 F (36.8 C) (12/29 1200) Pulse Rate:  [53-83] 62 (12/29 1445) Resp:  [11-38] 23 (12/29 1445) BP: (95-170)/(58-91) 95/83 (12/29 1445) SpO2:  [96 %-100 %] 97 % (12/29 1445) FiO2 (%):  [28 %-35 %] 28 % (12/29 1109) Weight:  [90 kg] 90 kg (12/29 0500)  Hemodynamic parameters for last 24 hours:    Intake/Output from previous day: 12/28 0701 - 12/29 0700 In: 1708.4 [I.V.:18.4; NG/GT:1690] Out: 1650 [Urine:1650]  Intake/Output this shift: No intake/output data recorded.  Vent settings for last 24 hours: FiO2 (%):  [28 %-35 %] 28 %  Physical Exam:  Gen: comfortable, no distress Neuro: non-focal exam HEENT: PERRL Neck: supple CV: RRR Pulm: unlabored breathing Abd: soft, NT GU: clear yellow urine Extr: wwp, no edema   Results for orders placed or performed during the hospital encounter of 11/25/21 (from the past 24 hour(s))  Glucose, capillary     Status: Abnormal   Collection Time: 12/12/21  3:45 PM  Result Value Ref Range   Glucose-Capillary 130 (H) 70 - 99 mg/dL  Glucose, capillary     Status: Abnormal   Collection Time: 12/12/21  7:52 PM  Result Value Ref Range   Glucose-Capillary 124 (H) 70 - 99 mg/dL   Comment 1 Notify RN    Comment 2 Document in Chart   Glucose, capillary     Status: Abnormal   Collection Time: 12/13/21 12:21 AM  Result Value Ref Range   Glucose-Capillary 112 (H) 70 - 99 mg/dL   Comment 1 Notify RN    Comment 2 Document in Chart   Glucose, capillary     Status: Abnormal   Collection Time: 12/13/21  4:14 AM  Result Value Ref Range   Glucose-Capillary 125 (H) 70 - 99 mg/dL   Comment 1 Notify RN    Comment 2 Document in Chart   CBC     Status: Abnormal   Collection Time: 12/13/21  4:17 AM  Result Value Ref Range   WBC 8.3 4.0 - 10.5 K/uL   RBC 3.91 (L) 4.22 - 5.81 MIL/uL    Hemoglobin 12.1 (L) 13.0 - 17.0 g/dL   HCT 36.2 (L) 39.0 - 52.0 %   MCV 92.6 80.0 - 100.0 fL   MCH 30.9 26.0 - 34.0 pg   MCHC 33.4 30.0 - 36.0 g/dL   RDW 14.6 11.5 - 15.5 %   Platelets 298 150 - 400 K/uL   nRBC 0.0 0.0 - 0.2 %  Basic metabolic panel     Status: Abnormal   Collection Time: 12/13/21  4:17 AM  Result Value Ref Range   Sodium 140 135 - 145 mmol/L   Potassium 3.5 3.5 - 5.1 mmol/L   Chloride 108 98 - 111 mmol/L   CO2 22 22 - 32 mmol/L   Glucose, Bld 116 (H) 70 - 99 mg/dL   BUN 17 8 - 23 mg/dL   Creatinine, Ser 0.63 0.61 - 1.24 mg/dL   Calcium 7.9 (L) 8.9 - 10.3 mg/dL   GFR, Estimated >60 >60 mL/min   Anion gap 10 5 - 15  Glucose, capillary     Status: None   Collection Time: 12/13/21 12:02 PM  Result Value Ref Range   Glucose-Capillary 95 70 - 99 mg/dL    Assessment & Plan:  Present on Admission: **  None**    LOS: 18 days   Additional comments:I reviewed the patient's new clinical lab test results.   and I reviewed the patients new imaging test results.    89M s/p stroke  Dysphagia - plan for PEG today. NPO at [facility]. Consent signed by wife  Dispo - per primary team   Jesusita Oka, MD Trauma & General Surgery Please use [email or web address] to contact on call provider  12/13/2021  *Care during the described time interval was provided by me. I have reviewed this patient's available data, including medical history, events of note, physical examination and test results as part of my evaluation.

## 2021-12-14 ENCOUNTER — Inpatient Hospital Stay (HOSPITAL_COMMUNITY): Payer: Medicare Other

## 2021-12-14 DIAGNOSIS — J9602 Acute respiratory failure with hypercapnia: Secondary | ICD-10-CM | POA: Diagnosis not present

## 2021-12-14 DIAGNOSIS — I4892 Unspecified atrial flutter: Secondary | ICD-10-CM | POA: Diagnosis not present

## 2021-12-14 DIAGNOSIS — I639 Cerebral infarction, unspecified: Secondary | ICD-10-CM | POA: Diagnosis not present

## 2021-12-14 LAB — GLUCOSE, CAPILLARY
Glucose-Capillary: 105 mg/dL — ABNORMAL HIGH (ref 70–99)
Glucose-Capillary: 140 mg/dL — ABNORMAL HIGH (ref 70–99)
Glucose-Capillary: 124 mg/dL — ABNORMAL HIGH (ref 70–99)
Glucose-Capillary: 111 mg/dL — ABNORMAL HIGH (ref 70–99)
Glucose-Capillary: 120 mg/dL — ABNORMAL HIGH (ref 70–99)
Glucose-Capillary: 100 mg/dL — ABNORMAL HIGH (ref 70–99)

## 2021-12-14 LAB — CBC
HCT: 33 % — ABNORMAL LOW (ref 39.0–52.0)
Hemoglobin: 11.1 g/dL — ABNORMAL LOW (ref 13.0–17.0)
MCH: 30.7 pg (ref 26.0–34.0)
MCHC: 33.6 g/dL (ref 30.0–36.0)
MCV: 91.4 fL (ref 80.0–100.0)
Platelets: 300 10*3/uL (ref 150–400)
RBC: 3.61 MIL/uL — ABNORMAL LOW (ref 4.22–5.81)
RDW: 14.5 % (ref 11.5–15.5)
WBC: 6.7 10*3/uL (ref 4.0–10.5)
nRBC: 0 % (ref 0.0–0.2)

## 2021-12-14 LAB — BASIC METABOLIC PANEL
Anion gap: 5 (ref 5–15)
BUN: 23 mg/dL (ref 8–23)
CO2: 25 mmol/L (ref 22–32)
Calcium: 7.8 mg/dL — ABNORMAL LOW (ref 8.9–10.3)
Chloride: 110 mmol/L (ref 98–111)
Creatinine, Ser: 0.76 mg/dL (ref 0.61–1.24)
GFR, Estimated: >60 mL/min (ref >60)
Glucose, Bld: 109 mg/dL — ABNORMAL HIGH (ref 70–99)
Potassium: 3.9 mmol/L (ref 3.5–5.1)
Sodium: 140 mmol/L (ref 135–145)

## 2021-12-14 LAB — PHOSPHORUS: Phosphorus: 2.9 mg/dL (ref 2.5–4.6)

## 2021-12-14 LAB — MAGNESIUM: Magnesium: 2.4 mg/dL (ref 1.7–2.4)

## 2021-12-14 IMAGING — CT CT HEAD W/O CM
4 of 6 series · 17 of 47 positions shown, 18 images · non-contrast
Comparison: [DATE]

CLINICAL DATA: Stroke follow-up

EXAM:
CT HEAD WITHOUT CONTRAST
TECHNIQUE: Contiguous axial images were obtained from the base of the skull
through the vertex without intravenous contrast.

[Series 3: head bone · axial · 0.44mm/px · z∈[+1224,+1350]mm · 7 of 86 slices shown]
[im 6/86  bone]
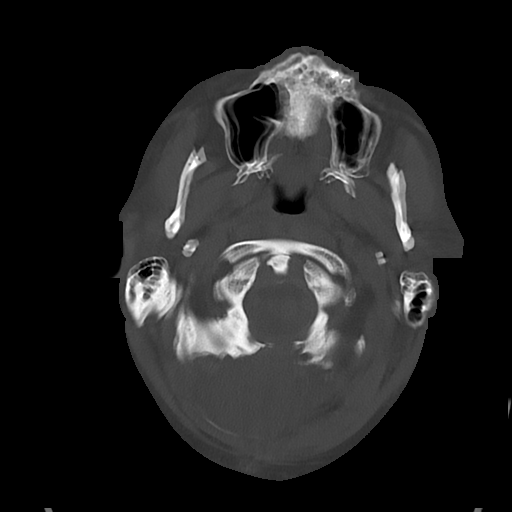
[im 18/86  bone]
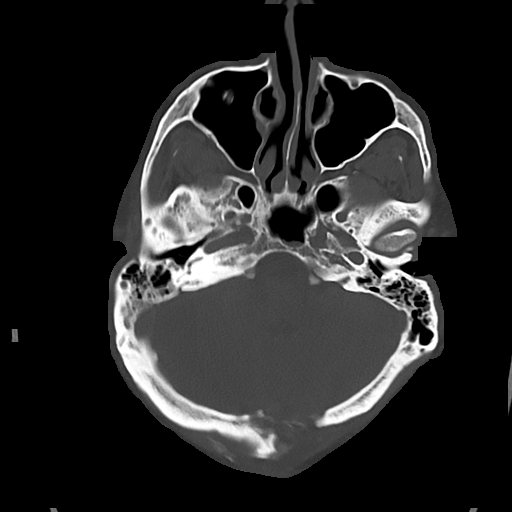
[im 29/86  bone]
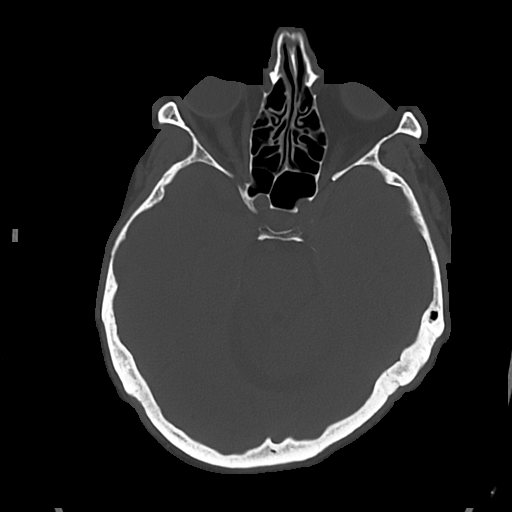
[im 40/86  bone]
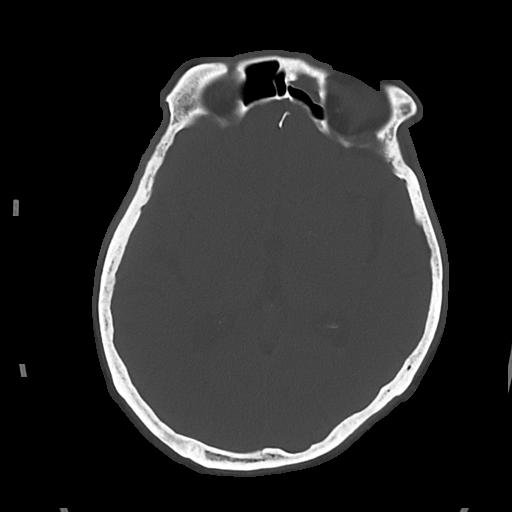
[im 46/86  bone]
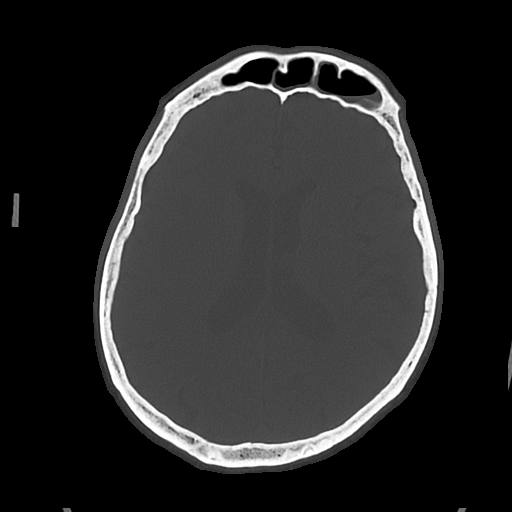
[im 57/86  bone]
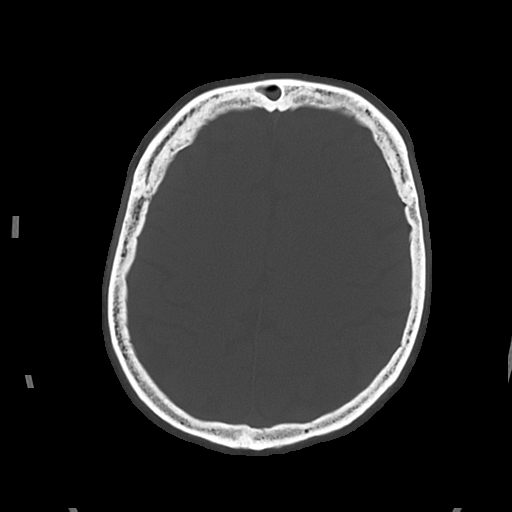
[im 69/86  bone]
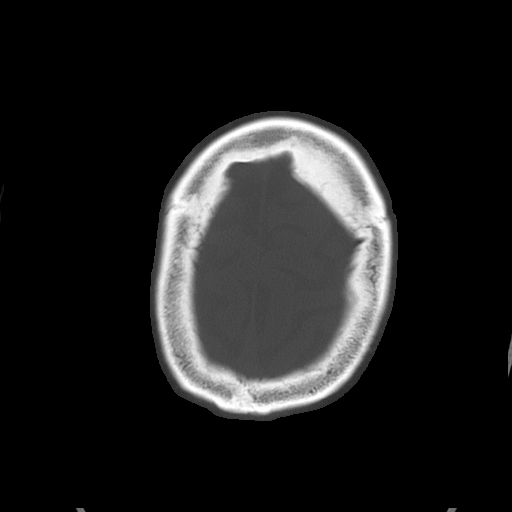

[Series 4: head wo · axial · 0.42mm/px · z∈[+1244,+1348]mm · 4 of 35 slices shown, 5 images]
[im 7/35  brain]
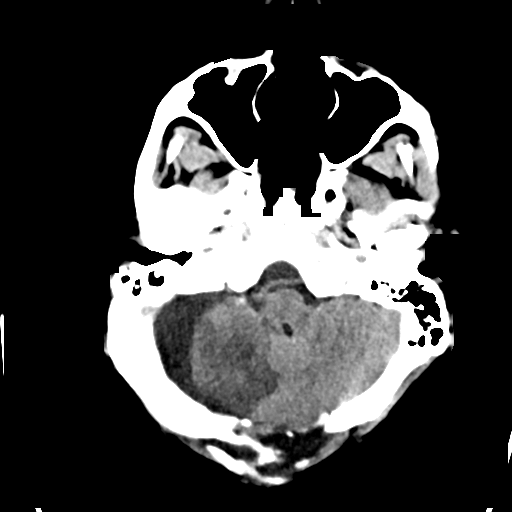
[im 7/35  bone]
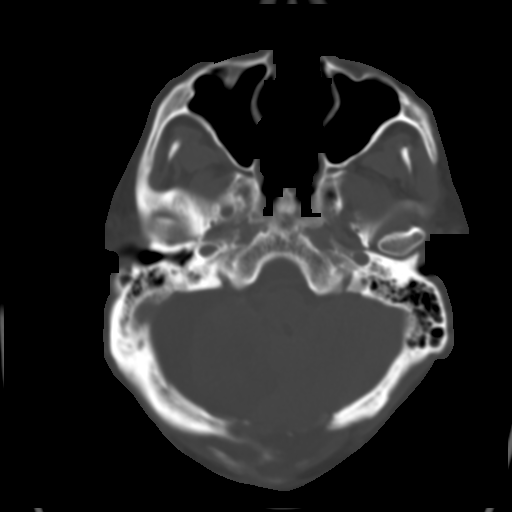
[im 14/35  brain]
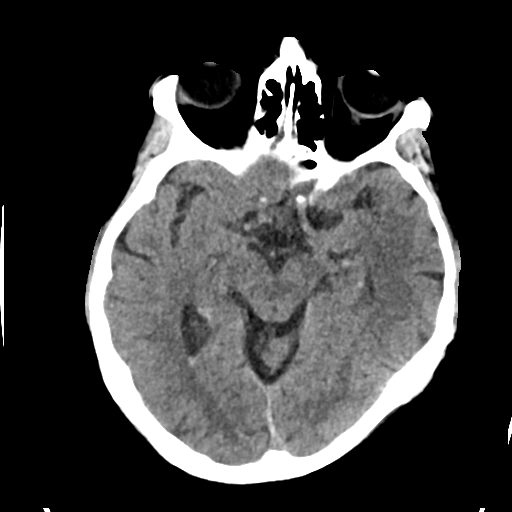
[im 21/35  brain]
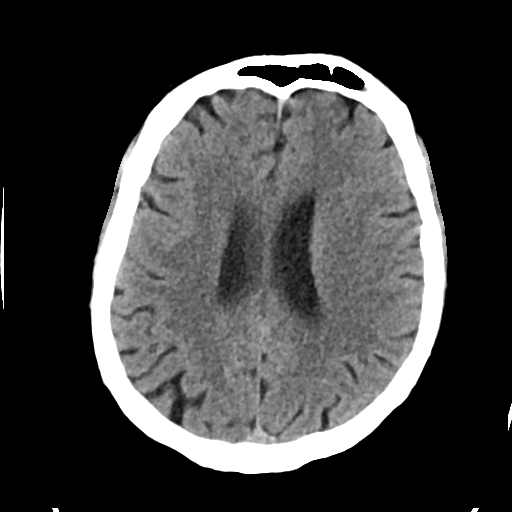
[im 28/35  brain]
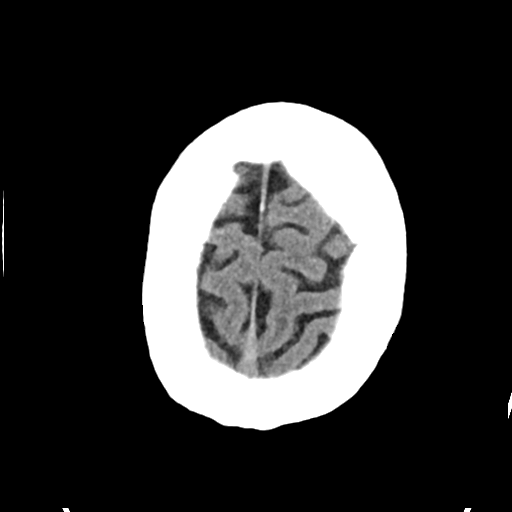

[Series 5: cor soft · coronal · 0.33mm/px · 3 of 74 slices shown]
[im 25/74  brain]
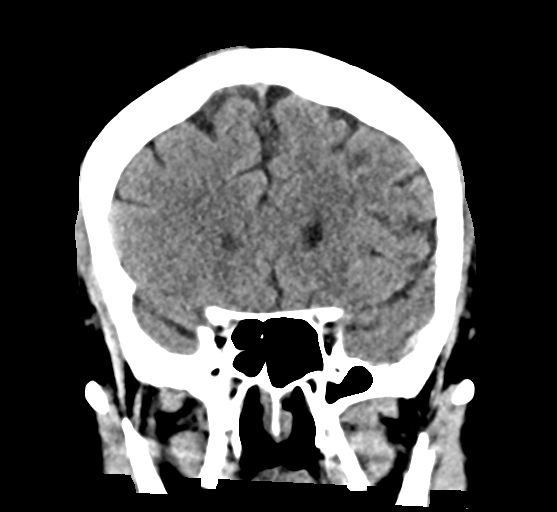
[im 33/74  brain]
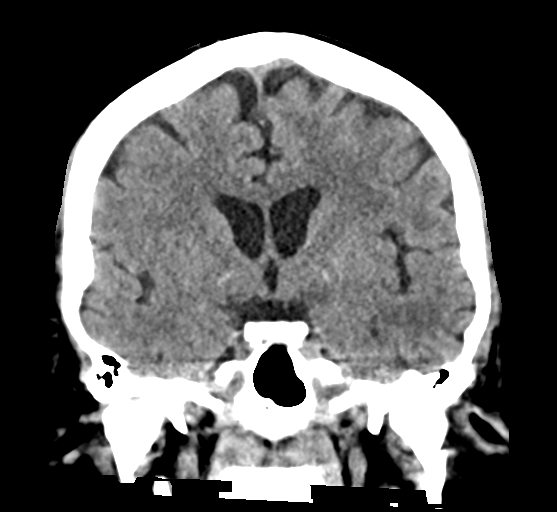
[im 41/74  brain]
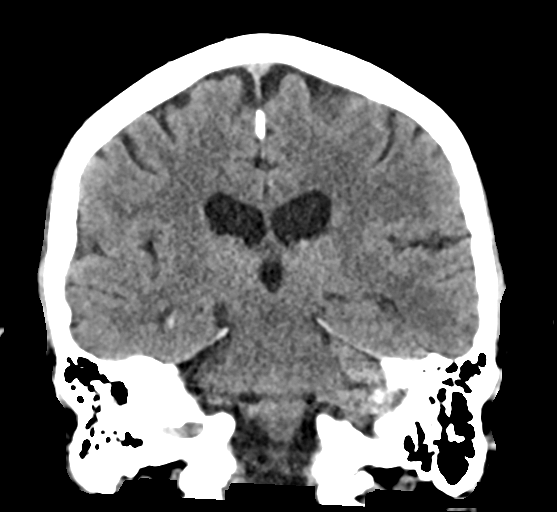

[Series 6: sag soft · sagittal · 0.33mm/px · 3 of 62 slices shown]
[im 21/62  brain]
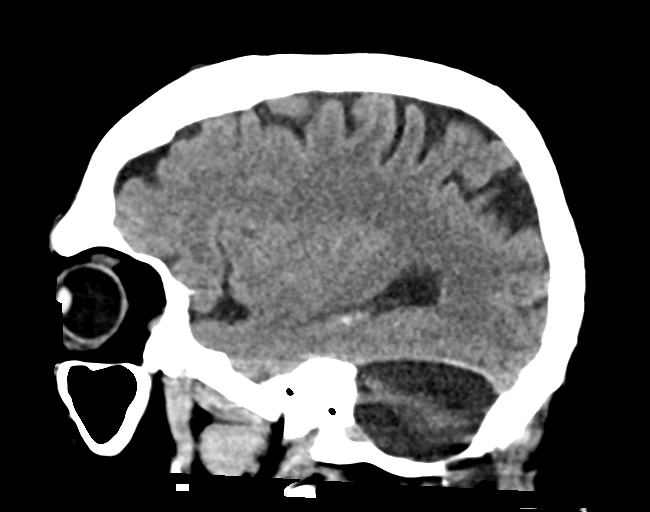
[im 31/62  brain]
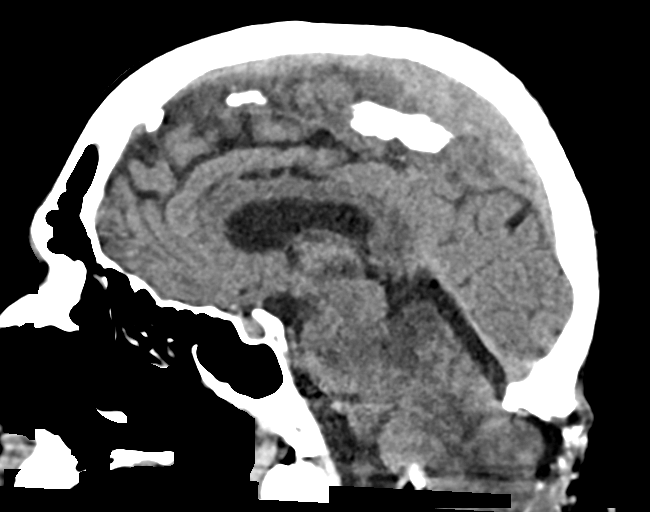
[im 41/62  brain]
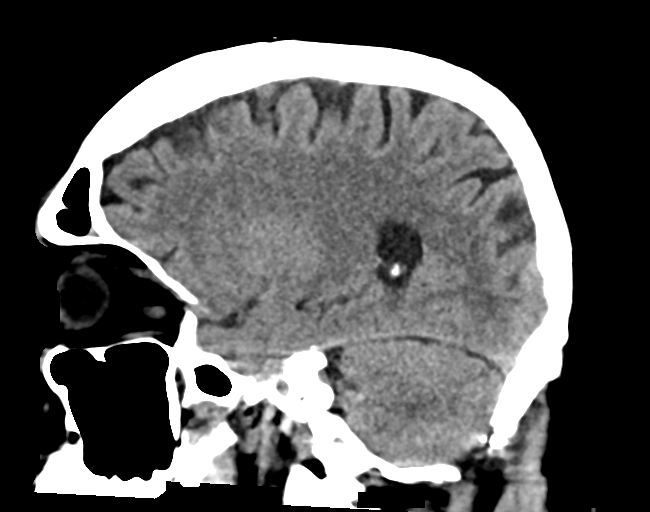

[17 of 47 positions shown; findings below may reference images not displayed]

FINDINGS: Brain: Mildly decreased edema in the right cerebellar hemisphere.
There is persistent herniation of the cerebellum through the
posterior craniectomy defect. No acute hemorrhage. Unchanged size
and configuration of the ventricles.

Vascular: No abnormal hyperdensity of the major intracranial
arteries or dural venous sinuses. No intracranial atherosclerosis.

Skull: The visualized skull base, calvarium and extracranial soft
tissues are normal.

Sinuses/Orbits: Small amount of fluid in the left frontal sinus. The
orbits are normal.
IMPRESSION: 1. Mildly decreased edema in the right cerebellar hemisphere with
persistent herniation of the cerebellum through the posterior
craniectomy defect.
2. No acute hemorrhage.

## 2021-12-14 MED ORDER — LACTATED RINGERS IV BOLUS
500.0000 mL | Freq: Once | INTRAVENOUS | Status: AC
  Administered 2021-12-14: 500 mL via INTRAVENOUS

## 2021-12-14 NOTE — Progress Notes (Signed)
Occupational Therapy Treatment Patient Details Name: [name] MRN: [id number] DOB: 1946/08/23 Today's Date: 12/14/2021   History of present illness 75 yo male presenting to ED on 12/11 with ataxia and vertigo. 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 TNK given at 1214. S/p suboccipital crani for evacuation on 12/13. Required intubation 12/20-12/22. 12/26 trach 12/27 trach trials 12/29 Peg PMH including CAD s/p CABG x4, HLD, OA, nephrolithiasis, urinary retention, BPH with LUTS and prostate cancer.   OT comments  Pt very fatigued this session after peg placement 12/29 and on vent cpap support. Pt sitting eob and in bed chair position. Recommendations remain CIR at this time.     Recommendations for follow up therapy are one component of a multi-disciplinary discharge planning process, led by the attending physician.  Recommendations may be updated based on patient status, additional functional criteria and insurance authorization.    Follow Up Recommendations  Acute inpatient rehab (3hours/day)    Assistance Recommended at Discharge Frequent or constant Supervision/Assistance  Equipment Recommendations  BSC/3in1    Recommendations for Other Services PT consult;Rehab consult;Speech consult    Precautions / Restrictions Precautions Precautions: Fall Precaution Comments: trach peg vent foley Restrictions Weight Bearing Restrictions: No Other Position/Activity Restrictions: Wears a lift in R shoe s/p hip replacement on L causing leg length discrepency       Mobility Bed Mobility Overal bed mobility: Needs Assistance Bed Mobility: Supine to Sit;Sit to Supine     Supine to sit: Total assist;+2 for physical assistance;+2 for safety/equipment Sit to supine: +2 for safety/equipment;+2 for physical assistance;Mod assist   General bed mobility comments: pt without any motivation to come to EOB and using the bed pad to pivot toward vent  side L. pt static sitting and resisting attmepting to return to supine. pt with stable BP. pt initiates bil Le onto bed surface and needs (A) to safely nagivate lines/ leads with vent    Transfers                   General transfer comment: no attempted     Balance Overall balance assessment: Needs assistance Sitting-balance support: Bilateral upper extremity supported Sitting balance-Leahy Scale: Zero Sitting balance - Comments: posterior and L lateral lean Postural control: Left lateral lean;Posterior lean                                 ADL either performed or assessed with clinical judgement   ADL Overall ADL's : Needs assistance/impaired Eating/Feeding: NPO Eating/Feeding Details (indicate cue type and reason): peg 12/29 Grooming: Total assistance   Upper Body Bathing: Total assistance   Lower Body Bathing: Total assistance   Upper Body Dressing : Total assistance   Lower Body Dressing: Total assistance                      Extremity/Trunk Assessment Upper Extremity Assessment RUE Deficits / Details: AROM of R UE with mitten this session. pt attempting to reach for face   Lower Extremity Assessment Lower Extremity Assessment: Defer to PT evaluation        Vision   Additional Comments: eyes closed 90% of session. pt opening L eye and keeping R eye occluded. questions visual deficits. pt unable to sustain visual attention this session   Perception     Praxis      Cognition Arousal/Alertness: Lethargic;Suspect due to medications Behavior During  Therapy: Flat affect Overall Cognitive Status: Difficult to assess Area of Impairment: Following commands;Problem solving                       Following Commands: Follows one step commands with increased time;Follows one step commands inconsistently     Problem Solving: Decreased initiation;Slow processing;Requires verbal cues;Requires tactile cues General Comments: pt with  name call and tactile input. pt over the vent says "WHAT" in an annoyed expression / response. pt closing eyes and ignoring therapist but responds to sister at name call.          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 pt's son [name] is coming from [employer] 12/31 to visit Other Exercises: pt motivated to remain supine at this time   Shoulder Instructions       General Comments Cpap vent BP 104/57 in all positions pt requires rescue breath support x2 during session    Pertinent Vitals/ Pain       Pain Assessment: CPOT Facial Expression: Relaxed, neutral Body Movements: Absence of movements Muscle Tension: Relaxed Compliance with ventilator (intubated pts.): Coughing but tolerating Vocalization (extubated pts.): N/A CPOT Total: 1 Pain Intervention(s): Monitored during session;Repositioned  Home Living                                          Prior Functioning/Environment              Frequency  Min 2X/week        Progress Toward Goals  OT Goals(current goals can now be found in the care plan section)  Progress towards OT goals: Progressing toward goals  Acute Rehab OT Goals Patient Stated Goal: to watch the [facility] game at 12 noon with sister today. OT/ PT finding the network and changing the channel on TV in prep OT Goal Formulation: Patient unable to participate in goal setting Time For Goal Achievement: 12/26/21 Potential to Achieve Goals: Good ADL Goals Pt Will Perform Grooming: with set-up;with supervision;sitting Pt Will Perform Lower Body Dressing: with min assist;sit to/from stand Pt Will Transfer to Toilet: with min assist;stand pivot transfer;bedside commode Pt Will Perform Toileting - Clothing Manipulation and hygiene: with min guard assist;sitting/lateral leans Additional ADL Goal #1: Pt will  complete bed mobility mod (A) as precursor to adls.  Plan Discharge plan remains appropriate    Co-evaluation    PT/OT/SLP Co-Evaluation/Treatment: Yes Reason for Co-Treatment: Complexity of the patient's impairments (multi-system involvement);Necessary to address cognition/behavior during functional activity;For patient/therapist safety;To address functional/ADL transfers PT goals addressed during session: Mobility/safety with mobility OT goals addressed during session: ADL's and self-care;Proper use of Adaptive equipment and DME;Strengthening/ROM      AM-PAC OT "6 Clicks" Daily Activity     Outcome Measure   Help from another person eating meals?: A Lot Help from another person taking care of personal grooming?: A Lot Help from another person toileting, which includes using toliet, bedpan, or urinal?: A Lot Help from another person bathing (including washing, rinsing, drying)?: A Lot Help from another person to put on and taking off regular upper body clothing?: A Lot Help from another person to put on and taking off regular lower body clothing?: Total 6 Click Score: 11    End of  Session Equipment Utilized During Treatment: Oxygen  OT Visit Diagnosis: Unsteadiness on feet (R26.81);Other abnormalities of gait and mobility (R26.89);Muscle weakness (generalized) (M62.81);History of falling (Z91.81)   Activity Tolerance Patient tolerated treatment well   Patient Left in bed;with bed alarm set;with call bell/phone within reach;with family/visitor present;with restraints reapplied   Nurse Communication Mobility status        Time: 3532-9924 OT Time Calculation (min): 24 min  Charges: OT General Charges $OT Visit: 1 Visit OT Treatments $Therapeutic Activity: 8-22 mins   Brynn, OTR/L  Acute Rehabilitation Services Pager: [phone] Office: [phone] .   Jeri Modena 12/14/2021, 1:54 PM

## 2021-12-14 NOTE — Progress Notes (Addendum)
STROKE TEAM PROGRESS NOTE   INTERVAL HISTORY Patient is seen in his room with his sister at the bedside and wife on the phone. He is awake but appears a little drowsy, he is able to follow commands. We will continue adjusting delirium medication for optimal wakefulness. Lethargic last night and had repeat CT that showed decreased cerebral edema. He is on a wean on the ventilator right now, attempt C-collar when able in an effort to transfer out of ICU. Some tachypnea at the moment.   Vitals:   12/14/21 1200 12/14/21 1300 12/14/21 1321 12/14/21 1330  BP:  111/68 106/82 106/75  Pulse:  (!) 58  (!) 59  Resp:  16  16  Temp: 99 F (37.2 C)     TempSrc: Axillary     SpO2:  98%  98%  Weight:      Height:       CBC:  Recent Labs  Lab 12/13/21 0417 12/14/21 0329  WBC 8.3 6.7  HGB 12.1* 11.1*  HCT 36.2* 33.0*  MCV 92.6 91.4  PLT 298 010   Basic Metabolic Panel:  Recent Labs  Lab 12/13/21 0417 12/14/21 0329  NA 140 140  K 3.5 3.9  CL 108 110  CO2 22 25  GLUCOSE 116* 109*  BUN 17 23  CREATININE 0.63 0.76  CALCIUM 7.9* 7.8*  MG  --  2.4  PHOS  --  2.9   Lipid Panel:  No results for input(s): CHOL, TRIG, HDL, CHOLHDL, VLDL, LDLCALC in the last 168 hours.   HgbA1c:  No results for input(s): HGBA1C in the last 168 hours.  Urine Drug Screen:  No results for input(s): LABOPIA, COCAINSCRNUR, LABBENZ, AMPHETMU, THCU, LABBARB in the last 168 hours.   Alcohol Level No results for input(s): ETH in the last 168 hours.  IMAGING past 24 hours CT HEAD WO CONTRAST (5MM)  Result Date: 12/14/2021 CLINICAL DATA:  Stroke follow-up EXAM: CT HEAD WITHOUT CONTRAST TECHNIQUE: Contiguous axial images were obtained from the base of the skull through the vertex without intravenous contrast. COMPARISON:  12/08/2021 FINDINGS: Brain: Mildly decreased edema in the right cerebellar hemisphere. There is persistent herniation of the cerebellum through the posterior craniectomy defect. No acute  hemorrhage. Unchanged size and configuration of the ventricles. Vascular: No abnormal hyperdensity of the major intracranial arteries or dural venous sinuses. No intracranial atherosclerosis. Skull: The visualized skull base, calvarium and extracranial soft tissues are normal. Sinuses/Orbits: Small amount of fluid in the left frontal sinus. The orbits are normal. IMPRESSION: 1. Mildly decreased edema in the right cerebellar hemisphere with persistent herniation of the cerebellum through the posterior craniectomy defect. 2. No acute hemorrhage. Electronically Signed   By: Ulyses Jarred M.D.   On: 12/14/2021 00:39    PHYSICAL EXAM  Temp:  [98.4 F (36.9 C)-100.6 F (38.1 C)] 99 F (37.2 C) (12/30 1200) Pulse Rate:  [53-68] 59 (12/30 1330) Resp:  [13-32] 16 (12/30 1330) BP: (60-169)/(39-102) 106/75 (12/30 1330) SpO2:  [97 %-100 %] 98 % (12/30 1330) FiO2 (%):  [30 %-40 %] 30 % (12/30 1132) Weight:  [89.8 kg] 89.8 kg (12/30 0500)  General - Well nourished, well developed, tracheostomy in place, drowsy, but following commands  Neurological: PERRL, blinks to threat bilaterally, tongue midline, able to move all four extremities in response to commands. Moving all extremities spontaneously. WTP in all extremities. Smile symmetrical   ASSESSMENT/PLAN [name] is a 75 y.o. male with history of CABG x4, hyperlipidemia, prostate cancer, internal hemorrhoid,  and a nonobstructive calculus in right interpole with hematuria presenting with acute onset vertigo nausea vomiting and an inability to walk.  At 10 AM on November 25, 2021 patient developed vertigo nausea and vomiting.  He also noted abdominal discomfort.  A code stroke was called on his arrival to triage.  Once the patient was in the CT scanner he developed more nausea and vomiting he went to a stat MRI which was positive for bilateral cerebellar strokes.  He then developed ataxia in his bilateral upper and lower extremities.  He was then given  TNKase and went for a stat CTA of the head.  No LVO was shown on the CTA. Repeat MRI showed progression of edema related to the cerebellar infarcts with mass effect on the 4th ventricle. Neurosurgery consulted and hypertonic saline initiated. 12/13 suboccipital craniectomy overnight.  Patient tolerated procedure well.  Speech therapy recommends resuming nectar thickened liquids with his diet.  Repeat CT 12/14 shows hemorrhagic transformation of patient's stroke.  Lovenox resumed as he is post 24 hours from hemorrhagic transformation of his stroke.  Patient's agitation has improved with trial of clonazepam, and his neurological exam has improved.  Palliative care has been consulted to aid in Jud discussions.  Repeat head CT shows no additional bleeding. Repeat MRI shows improved brainstem compression. Abnormal restriction signal at bilateral caudate tails and splenium. Tracheostomy done 12/26. Plan for PEG tube 12/29.  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 ASPECTS 10.    Repeat CT-extensive swelling in the areas of the acute cerebellar infarction more extensive on the right than the left.  Mass-effect on the fourth ventricle.  No evidence of ventricular obstruction.  Stable lateral and third ventricles. Post craniectomy CT-Unchanged right greater than left cerebellar infarcts.  No new infarction.  Unchanged narrowing of the fourth ventricle.  No evidence of ventricular obstruction or hydrocephalus. Repeat CT 12/14 New hyperdensity in right cerebellar infarct, likely hemorrhage with no increased mass effect. CTA head & neck unremarkable MRI  acute infarct of the cerebellum bilaterally.  negative for hemorrhage.  MRI Repeat 12/12- Interval progression of edema associated to bilateral cerebellar acute infarcts with mass  effect on the fourth ventricle and low lying right cerebellar tonsil. No hydrocephalus CT head 12/14 -right cerebellum hemorrhagic transformation without hydrocephalus CT serial repeats - stable postoperative changes involving infarct resolving intraventricular blood.  No acute abnormalities MRI repeat 12/26- stable right cerebellum infarct with hemorrhagic transformation, much improved brainstem compression, fourth ventricle now only minimal compression. abnormal restriction signal at bilateral caudate tails and splenium, concerning for cytotoxic changes 2D Echo EF 70-75% LDL 106 HgbA1c 5.8 VTE prophylaxis - SCDs aspirin 81 mg daily prior to admission, now on aspirin 81, plavix discontinued due to hemorrhagic transformation of cerebellar stroke. Will consider DOAC once CT stable and po access secured Therapy recommendations: Inpatient rehab- [facility] rehab closer to patients home Disposition: Pending  Cerebellar Edema s/p suboccipital decompression  3% normal saline initiated on 12/12 Sodium goal is 150-155 Na 156 S/p suboccipital decompression with Dr. Zada Finders 3% d/c'd and then resumed due to sodium levels -> now off Close neuro monitoring CT 12/13 reveals unchanged infarcts without evidence of hydrocephalus CT head 12/14 - right cerebellum hemorrhagic transformation without hydrocephalus CT head serial repeats - Stable cerebellar infarct with hemorrhage.  No hydrocephalus or interval change Repeat MRI showed improved  brainstem compression, abnormal signal at bilateral caudate tails and splenium  Hypertension -> hypotension Home meds: None fluctuates On clonidine 0.1 Q8  BP goal normotensive PRN hydralazine and levophed  Atrial flutter - new diagnosis Cardiology on board As needed metoprolol for rate control Will start IV heparin if no further bleeding seen on today's CT head Consider DOAC once CT stable - PEG placed 12/13/2021  Fever Leukocytosis Aspiration  pneumonia Respiratory distress presumed aspiration pneumonia intubated 12/04/2021,then extubated Reintubated 12/26 due to respiratory distress, aspiration on secretions, tachypnea T-max 101.9-> afebrile->100.8-> 99 WBC 13.3->13.6->10.0->9.9-> 8.2->6.9-> 7.5->8.1-> 8.3-> 3.7 CXR 12/13 - Left lower lobe consolidation with small adjacent left pleural effusion. Findings are likely either due to pneumonia or aspiration. CXR 12/26 - Small left-sided pleural effusion with worsening consolidation at the left base. On unasyn -> rocephin Plan to re-try trach collar today -> if tolerating, will transfer to floor tomorrow  Hyperlipidemia Home meds: Atorvastatin 80 mg, resumed in hospital LDL 106, goal < 70 Atorvastatin 80 mg daily Continue statin at discharge  Delirium/agitation Encephalopathy Precedex stopped due to bradycardia Fentanyl infusing after intubation Klonopin increased to 0.5mg  QHS Olanzapine ODT 5 mg Q8h MRI 12/26 concerning for b/l caudate tails/splenium restriction - cytotoxic changes - may be able to explain pt delirium/agitation  Correct metabolic underlying cause Avoid significant electrolyte changes, avoid neurotoxic medications Repeat MRI in 1-2 months at outpt follow up Emphasize sleep hygiene Consider psychiatry consult if not improved  Dysphagia NPO for now MBSS done 12/16 NG placed 12/26- TF infusion PEG placed 12/13/2021  Other Stroke Risk Factors Advanced Age >/= 65  Obesity, Body mass index is 28.41 kg/m., BMI >/= 30 associated with increased.  Use of oral blood pressure medications.  Risk, recommend weight loss, diet and exercise as appropriate  Coronary artery disease s/p CABG  Other Active Problems Abdominal discomfort CTA abdomen pelvis-no evidence of active GI bleed.  Moderate aortic atherosclerosis.  Diverticulosis with no evidence of diverticulitis. Internal hemorrhoid Nonobstructive calculus in right interpole Hematuria one month ago Urecholine  10 mg 3 times daily  Hospital day # 19  Patient seen by NP/APP with MD. MD to update note as needed.   Patient seen and examined by NP/APP with MD. MD to update note as needed.   Janine Ores, DNP, FNP-BC [facility] Pager: [phone]  ATTENDING NOTE: I reviewed above note and agree with the assessment and plan. Pt was seen and examined.   Patient sister at bedside.  Patient had good sleep last night, still mildly drowsy this morning but seems more awake alert than yesterday, mouthing "good morning" to sister, and follows a couple simple commands with b/l hands.  Currently back on pressure support on ventilation, will retry trach collar today.  If tolerating, consider to transfer out of ICU tomorrow.  On PEG tube feeding for nutrition and medication.  Continue olanzapine and Klonopin for now.  For detailed assessment and plan, please refer to above as I have made changes wherever appropriate.   Rosalin Hawking, MD PhD Stroke Neurology 12/14/2021 7:07 PM  This patient is critically ill due to respiratory failure status post tracheostomy, dysphagia status post PEG tube, a flutter, stroke with hemorrhagic transformation, aspiration pneumonia, agitation/delirium and at significant risk of neurological worsening, death form respiratory failure, sepsis, seizure, heart failure, encephalopathy. This patient's care requires constant monitoring of vital signs, hemodynamics, respiratory and cardiac monitoring, review of multiple databases, neurological assessment, discussion with family, other specialists and medical decision making of high complexity. I  spent 35 minutes of neurocritical care time in the care of this patient. I had long discussion with sister at bedside and wife over the phone, updated pt current condition, treatment plan and potential prognosis, and answered all the questions.  They expressed understanding and appreciation.       To contact Stroke Continuity provider,  please refer to [email or web address] After hours, contact General Neurology

## 2021-12-14 NOTE — Progress Notes (Signed)
Patient transported to CT and back to 4N29 via the ventilator with no complications.

## 2021-12-14 NOTE — Progress Notes (Signed)
eLink Physician-Brief Progress Note Patient Name: [name] DOB: 09-Jul-1946 MRN: [id number]   Date of Service  12/14/2021  HPI/Events of Note  Pt received 7mg  of versed at 1500 for PEG as well as scheduled meds @ 2200 Very lethargic since and now hard to wake  STAT head CT ordered by stroke  BP is soft  eICU Interventions  500 LR bolus HOLD BP meds until BP recovers     Intervention Category Intermediate Interventions: Hypotension - evaluation and management  Malaya Cagley V. Kieana Livesay 12/14/2021, 12:10 AM

## 2021-12-14 NOTE — Progress Notes (Signed)
Patient ID: [name], male   DOB: November 11, 1946, 75 y.o.   MRN: [id number] Events noted overnight, peg site clean, tol feeds, will sign off

## 2021-12-14 NOTE — Progress Notes (Signed)
Physical Therapy Treatment Patient Details Name: [name] MRN: [id number] DOB: 03-26-1946 Today's Date: 12/14/2021   History of Present Illness 75 yo male presenting to ED on 12/11 with ataxia and vertigo. 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 TNK given at 1214. S/p suboccipital crani for evacuation on 12/13. Required intubation 12/20-12/22. 12/26 trach 12/27 trach trials PMH including CAD s/p CABG x4, HLD, OA, nephrolithiasis, urinary retention, BPH with LUTS and prostate cancer.    PT Comments    Patient lethargic this date, suspect due to medications given for PEG placement on 12/29. Patient with eyes closed majority of session but opens upon sitting EOB and at various times. Required +2 assist to perform bed mobility. Continue to recommend CIR level therapies to assist with maximizing functional mobility and safety.    Recommendations for follow up therapy are one component of a multi-disciplinary discharge planning process, led by the attending physician.  Recommendations may be updated based on patient status, additional functional criteria and insurance authorization.  Follow Up Recommendations  Acute inpatient rehab (3hours/day) (from [facility], would like to go to [facility] rehab)     Assistance Recommended at Discharge Frequent or constant Supervision/Assistance  Equipment Recommendations  Other (comment) (TBD)    Recommendations for Other Services       Precautions / Restrictions Precautions Precautions: Fall Precaution Comments: trach, PEG Restrictions Weight Bearing Restrictions: No     Mobility  Bed Mobility Overal bed mobility: Needs Assistance Bed Mobility: Supine to Sit;Sit to Supine     Supine to sit: Max assist;+2 for physical assistance;+2 for safety/equipment Sit to supine: Mod assist;+2 for safety/equipment   General bed mobility comments: maxA+2 to complete supine to sit with patient lethargic. ModA to  return to supine with patient able to bring LEs back into bed with increased time and cues    Transfers                   General transfer comment: Not attempted due soft BP and patient impulsively returning to supine    Ambulation/Gait                   Stairs             Wheelchair Mobility    Modified Rankin (Stroke Patients Only) Modified Rankin (Stroke Patients Only) Pre-Morbid Rankin Score: No symptoms Modified Rankin: Severe disability     Balance Overall balance assessment: Needs assistance Sitting-balance support: Bilateral upper extremity supported;Feet supported Sitting balance-Leahy Scale: Poor Sitting balance - Comments: posterior and L lateral lean Postural control: Left lateral lean;Posterior lean                                  Cognition Arousal/Alertness: Lethargic Behavior During Therapy: Flat affect Overall Cognitive Status: Difficult to assess Area of Impairment: Following commands;Problem solving                       Following Commands: Follows one step commands with increased time;Follows one step commands inconsistently     Problem Solving: Decreased initiation;Slow processing;Requires verbal cues;Requires tactile cues General Comments: following commands <50% of commands with increased time. Responds to sister more than therapists. Responds "what" to name being called        Exercises      General Comments General comments (skin integrity, edema, etc.): soft BP 104/57 with drop  to 10O systolic upon sitting EOB      Pertinent Vitals/Pain Pain Assessment: CPOT Facial Expression: Relaxed, neutral Body Movements: Absence of movements Muscle Tension: Relaxed Compliance with ventilator (intubated pts.): Tolerating ventilator or movement Vocalization (extubated pts.): N/A CPOT Total: 0 Pain Intervention(s): Monitored during session    Home Living                          Prior  Function            PT Goals (current goals can now be found in the care plan section) Acute Rehab PT Goals Patient Stated Goal: not stated PT Goal Formulation: With patient/family Time For Goal Achievement: 12/26/21 Potential to Achieve Goals: Good Progress towards PT goals: Progressing toward goals    Frequency    Min 4X/week      PT Plan Current plan remains appropriate    Co-evaluation PT/OT/SLP Co-Evaluation/Treatment: Yes Reason for Co-Treatment: For patient/therapist safety;To address functional/ADL transfers PT goals addressed during session: Mobility/safety with mobility        AM-PAC PT "6 Clicks" Mobility   Outcome Measure  Help needed turning from your back to your side while in a flat bed without using bedrails?: Total Help needed moving from lying on your back to sitting on the side of a flat bed without using bedrails?: Total Help needed moving to and from a bed to a chair (including a wheelchair)?: Total Help needed standing up from a chair using your arms (e.g., wheelchair or bedside chair)?: Total Help needed to walk in hospital room?: Total Help needed climbing 3-5 steps with a railing? : Total 6 Click Score: 6    End of Session Equipment Utilized During Treatment: Oxygen Activity Tolerance: Patient limited by lethargy Patient left: in bed;with call bell/phone within reach;with family/visitor present;with restraints reapplied Nurse Communication: Mobility status PT Visit Diagnosis: Unsteadiness on feet (R26.81);Difficulty in walking, not elsewhere classified (R26.2);Ataxic gait (R26.0)     Time: 7121-9758 PT Time Calculation (min) (ACUTE ONLY): 23 min  Charges:  $Therapeutic Activity: 8-22 mins                     Haidyn Chadderdon A. Gilford Rile PT, DPT Acute Rehabilitation Services Pager [phone] Office [phone]    [name] 12/14/2021, 1:08 PM

## 2021-12-14 NOTE — Progress Notes (Signed)
This RN noticed drop in patient's BP at 2321 of 92/43 with a MAP of 58. Patient very lethargic/drowsy since PEG placement at 1500 12/29. Prior to PEG placement, patient received 7mg  total of versed, and once PEG functioning this RN gave 2200 medications which included clonidine, zyprexa, and zanaflex. Sternal rubbed patient to elicit response, but patient remained very somnolent. Repeat BP at 2325 was 60/39 with MAP of 47, but rose to 108/49 with MAP of 67 two minutes later.   With this assessment, I paged Stroke MD Lindzen to notify of patients continued lethargy since PEG placement procedure. STAT head CT ordered.  While preparing to take patient down for his STAT head CT, patient's BP was 85/40 with MAP of 55. This RN contacted E-link at this time for pressure support. 517ml LR bolus ordered by CCM MD Elsworth Soho.   Patient transported to and from CT with no difficulties. BP currently 119/61 with MAP of 78. CT results negative for any new abnormalities per results impression signed by MD Collins Scotland.   RN will continue to monitor.   Wyn Quaker, RN

## 2021-12-14 NOTE — Progress Notes (Signed)
NAME:  [name], MRN:  [id number], DOB:  26-May-1946, LOS: 63 ADMISSION DATE:  11/25/2021, CONSULTATION DATE:  12/13 REFERRING MD:  Marcelle Overlie, CHIEF COMPLAINT:  Stroke symptoms   History of Present Illness:  75 y/o male admitted with a cerebellar stroke on 12/11, received TNKase, he developed increasing mass effect on the 4th ventricle, moved to the ICU. 12/13 he had a suboccipital craniectomy.  Repeat CT 12/14 showed hemorrhagic conversion.  Pertinent  Medical History  CAD, s/p CABG  HLD  OA,  Nephrolithiasis BPH  Prostate cancer   Significant Hospital Events: Including procedures, antibiotic start and stop dates in addition to other pertinent events   12/11 admission, received TNKase 12/13 worsening edema/mass effect, had emergent craniotomy 12/14 hemorrhagic transformation of stroke 12/20 Finished unasyn for aspiration PNA, re intubated 12/22 Extubated 12/23 Palliative consulted 12/26 Re intubated. Tracheostomy performed, Finished ceftriaxone MRI> no new acute ischemia, improvement in brainstem compression, and abnormal signal at bilateral caudate tails and splenium  12/27 trach collar trials 12/28 off vent overnight  12/29 PEG, hypotensive post procedure, back on vent, CT head with no acute hemmorhage  Interim History / Subjective:  Tmax 100.6  BP 60/39-169/77  Hypotensive s/p trach, 564ml LR bolus, back on vent, head ct overnight  -169ml   Trach on vent  Unable to obtain subjective evaluation due to patient status   Objective   Blood pressure (!) 157/67, pulse 62, temperature 100.1 F (37.8 C), temperature source Axillary, resp. rate 18, height 5\' 10"  (1.778 m), weight 89.5 kg, SpO2 94 %.    Vent Mode: PSV;CPAP FiO2 (%):  [28 %-40 %] 30 % Set Rate:  [18 bmp] 18 bmp Vt Set:  [480 mL] 480 mL PEEP:  [5 cmH20] 5 cmH20 Pressure Support:  [12 cmH20] 12 cmH20 Plateau Pressure:  [15 cmH20-18 cmH20] 15 cmH20   Intake/Output Summary (Last 24 hours) at 12/14/2021  0911 Last data filed at 12/14/2021 0600 Gross per 24 hour  Intake 1511.96 ml  Output 1700 ml  Net -188.04 ml   Filed Weights   12/12/21 0500 12/13/21 0500 12/14/21 0500  Weight: 89.5 kg 90 kg 89.8 kg    Examination: General: In bed, NAD, appears comfortable HEENT: MM pink/moist, anicteric, atraumatic, Tracheostomy Neuro: RASS -1, PERRL 37mm, GCS 11t, sticks tongue out to command, MAE CV: S1S2, NSR, no m/r/g appreciated PULM:  air movement in all lobeslobes, trachea midline, chest expansion symmetric GI: soft, bsx4 active, non-tender, peg C/D/I   Extremities: warm/dry, no pretibial edema, capillary refill less than 3 seconds  Skin:  no rashes or lesions noted  HGB 12.1>11.1 WBC 6.7 CT head with no acute hemmorhage  Resolved Issues Hypernatremia Hypokalemia   Assessment & Plan:  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 -Continue ASA, plavix held d/t hemorraghic conversion -Repeat MRI in 1-2 months, will need outpatient neurology follow up -Will need rehab/LTAC post hospital, looking at [facility] inpatient rehab which is close to home  Hypotension s/p PEG placement-improving S/P 552ml LR bolus, antihypertensives held. Suspect secondary to sedation for PEG placement.  -Monitor as medications are resumed.  Acute hypoxic respiratory failure, s/p tracheostomy Possible aspiration pneumonia Right lower lobe pneumonia - s/p 7 days Unasyn and 7 days of ceftriaxone, Off vent since 10 AM 12/28. Resumed s/p PeG -LTVV strategy with tidal volumes of 4-8 cc/kg ideal  body weight -Goal plateau pressures less than 30 and driving pressures less than 15 -Wean PEEP/FiO2 for SpO2 92-98% -VAP bundle -On PS, with high RR, suspect secondary to agitation -RASS goal 0 to -1 -Meds  discussed below for agitation. Hope to wean vent further. Too tacypnic for trach collar at this time.   Dysphagia  - Modified barium swallow showing aspiration, now s/p -Peg placed 12/29 -management per GS  Chronic HFpEF Hypertension, uncontrolled CAD HX HLD -Resume clonidine, Carvedilol 12.5 bid, amlodipine and losartan when BP stabilizes -Continue ASA/Statin  Hyperactive delirium Sedated overnight, now agitated -resume zyprexa 5mg  BID when patient is no longer agitated -Resume clonidine when able -recommend stopping klonopin  Proximal atrial flutter -Resume coreg with hold parameters -AC plan per neuro. Neuro considering DOAC.   BPH -Continue flowmax -Monitor UOP  GOC -PC following  Best Practice (right click and "Reselect all SmartList Selections" daily)   Diet/type: NPO w/ meds via tube tube feeds DVT prophylaxis: LMWH GI prophylaxis: PPI Lines: N/A Foley:  N/A Code Status:  full code Last date of multidisciplinary goals of care discussion [12/26 remains full code]  Critical care time: 35 minutes   Redmond School., MSN, APRN, AGACNP-BC [facility]  12/14/2021 , 9:11 AM  Please see [email or web address] for pager details  If no response, please call [phone] After hours, please call Elink at [phone]

## 2021-12-14 NOTE — Progress Notes (Incomplete)
Attending note: I have seen and examined the patient. History, labs and imaging reviewed.  Got PET tube yesterday. Needed fluids for hypotension overnight. On PSV today.   Blood pressure 127/63, pulse 61, temperature 99.5 F (37.5 C), temperature source Axillary, resp. rate 19, height 5\' 10"  (1.778 m), weight 89.8 kg, SpO2 98 %. Gen:      No acute distress HEENT:  EOMI, sclera anicteric Neck:     No masses; no thyromegaly Lungs:    Clear to auscultation bilaterally; normal respiratory effort*** CV:         Regular rate and rhythm; no murmurs Abd:      + bowel sounds; soft, non-tender; no palpable masses, no distension Ext:    No edema; adequate peripheral perfusion Skin:      Warm and dry; no rash Neuro: alert and oriented x 3 Psych: normal mood and affect   Labs/Imaging personally reviewed, significant for   Assessment/plan: Acute stroke, with hemorrhagic conversion S/p craniectomy Continue aspirin, management per neurology  Acute respiratory failure Finished antibiotic course Resume PSV trials and back to trach collar Pulmonary hygiene Follow intermittent chest x-ray   Delirium Restart clonazepam, Zyprexa Restart agitation meds   The patient is critically ill with multiple organ systems failure and requires high complexity decision making for assessment and support, frequent evaluation and titration of therapies, application of advanced monitoring technologies and extensive interpretation of multiple databases.  Critical care time - 35 mins. This represents my time independent of the NPs time taking care of the pt.  Marshell Garfinkel MD [facility] 12/14/2021, 11:11 AM

## 2021-12-14 NOTE — Progress Notes (Signed)
Pt placed back on full vent support due to tachypnea and increased WOB. RN aware, RT will continue to monitor.

## 2021-12-15 DIAGNOSIS — Z515 Encounter for palliative care: Secondary | ICD-10-CM | POA: Diagnosis not present

## 2021-12-15 DIAGNOSIS — I639 Cerebral infarction, unspecified: Secondary | ICD-10-CM | POA: Diagnosis not present

## 2021-12-15 LAB — CBC
HCT: 32.7 % — ABNORMAL LOW (ref 39.0–52.0)
Hemoglobin: 10.8 g/dL — ABNORMAL LOW (ref 13.0–17.0)
MCH: 30.3 pg (ref 26.0–34.0)
MCHC: 33 g/dL (ref 30.0–36.0)
MCV: 91.6 fL (ref 80.0–100.0)
Platelets: 273 10*3/uL (ref 150–400)
RBC: 3.57 MIL/uL — ABNORMAL LOW (ref 4.22–5.81)
RDW: 14.3 % (ref 11.5–15.5)
WBC: 5.6 10*3/uL (ref 4.0–10.5)
nRBC: 0 % (ref 0.0–0.2)

## 2021-12-15 LAB — BASIC METABOLIC PANEL
Anion gap: 8 (ref 5–15)
BUN: 19 mg/dL (ref 8–23)
CO2: 26 mmol/L (ref 22–32)
Calcium: 8.1 mg/dL — ABNORMAL LOW (ref 8.9–10.3)
Chloride: 108 mmol/L (ref 98–111)
Creatinine, Ser: 0.74 mg/dL (ref 0.61–1.24)
GFR, Estimated: >60 mL/min (ref >60)
Glucose, Bld: 107 mg/dL — ABNORMAL HIGH (ref 70–99)
Potassium: 3.8 mmol/L (ref 3.5–5.1)
Sodium: 142 mmol/L (ref 135–145)

## 2021-12-15 LAB — GLUCOSE, CAPILLARY
Glucose-Capillary: 96 mg/dL (ref 70–99)
Glucose-Capillary: 118 mg/dL — ABNORMAL HIGH (ref 70–99)
Glucose-Capillary: 96 mg/dL (ref 70–99)
Glucose-Capillary: 112 mg/dL — ABNORMAL HIGH (ref 70–99)
Glucose-Capillary: 120 mg/dL — ABNORMAL HIGH (ref 70–99)
Glucose-Capillary: 127 mg/dL — ABNORMAL HIGH (ref 70–99)

## 2021-12-15 NOTE — Progress Notes (Signed)
NAME:  [name], MRN:  [id number], DOB:  July 22, 1946, LOS: 48 ADMISSION DATE:  11/25/2021, CONSULTATION DATE:  12/13 REFERRING MD:  Marcelle Overlie, CHIEF COMPLAINT:  Stroke symptoms   History of Present Illness:  75 y/o male admitted with a cerebellar stroke on 12/11, received TNKase, he developed increasing mass effect on the 4th ventricle, moved to the ICU. 12/13 he had a suboccipital craniectomy.  Repeat CT 12/14 showed hemorrhagic conversion.  Pertinent  Medical History  CAD, s/p CABG  HLD  OA,  Nephrolithiasis BPH  Prostate cancer   Significant Hospital Events: Including procedures, antibiotic start and stop dates in addition to other pertinent events   12/11 admission, received TNKase 12/13 worsening edema/mass effect, had emergent craniotomy 12/14 hemorrhagic transformation of stroke 12/20 Finished unasyn for aspiration PNA, re intubated 12/22 Extubated 12/23 Palliative consulted 12/26 Re intubated. Tracheostomy performed, Finished ceftriaxone MRI> no new acute ischemia, improvement in brainstem compression, and abnormal signal at bilateral caudate tails and splenium  12/27 trach collar trials 12/28 off vent overnight  12/29 PEG, hypotensive post procedure, back on vent, CT head with no acute hemmorhage  Interim History / Subjective:   No acute events.  Was tachypneic on pressure support weans today morning   Objective   Blood pressure (!) 157/67, pulse 62, temperature 100.1 F (37.8 C), temperature source Axillary, resp. rate 18, height 5\' 10"  (1.778 m), weight 89.5 kg, SpO2 94 %.    Vent Mode: PRVC FiO2 (%):  [30 %] 30 % Set Rate:  [18 bmp] 18 bmp Vt Set:  [480 mL] 480 mL PEEP:  [5 cmH20] 5 cmH20 Pressure Support:  [12 cmH20] 12 cmH20 Plateau Pressure:  [14 cmH20-18 cmH20] 18 cmH20   Intake/Output Summary (Last 24 hours) at 12/15/2021 0930 Last data filed at 12/15/2021 0600 Gross per 24 hour  Intake 1855 ml  Output 2200 ml  Net -345 ml   Filed Weights    12/13/21 0500 12/14/21 0500 12/15/21 0500  Weight: 90 kg 89.8 kg 89.8 kg    Examination: Gen:      No acute distress HEENT:  EOMI, sclera anicteric, trach Neck:     No masses; no thyromegaly Lungs:    Clear to auscultation bilaterally; normal respiratory effort CV:         Regular rate and rhythm; no murmurs Abd:      + bowel sounds; soft, non-tender; no palpable masses, no distension Ext:    No edema; adequate peripheral perfusion Skin:      Warm and dry; no rash Neuro: Awake  Labs/imaging reviewed No new imaging, labs are stable  Resolved Issues Hypernatremia Hypokalemia Hypotension s/p PEG placement   Assessment & Plan:  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 -Continue ASA, plavix held d/t hemorraghic conversion -Repeat MRI in 1-2 months, will need outpatient neurology follow up -Will need rehab/LTAC post hospital, looking at [facility] inpatient rehab which is close to home  Acute hypoxic respiratory failure, s/p tracheostomy Possible aspiration pneumonia Right lower lobe pneumonia - s/p 7 days Unasyn and 7 days of ceftriaxone, Off vent since 10 AM 12/28. Resumed s/p PeG Continue vent support Pressure support weans.  Hopefully he can get back to trach collar Follow intermittent chest x-ray tube feeds  Dysphagia  - Modified barium swallow showing aspiration, now s/p -Peg placed 12/29 -management per  GS  Chronic HFpEF Hypertension, uncontrolled CAD HX HLD -Clonidine, Carvedilol 12.5 bid, amlodipine and losartan when BP stabilizes -Continue ASA/Statin  Hyperactive delirium Sedated overnight, now agitated zyprexa 5mg  BID  Proximal atrial flutter -Resume coreg with hold parameters -AC plan per neuro. Neuro considering DOAC.   BPH -Continue  flowmax -Monitor UOP  GOC -PC following  Best Practice (right click and "Reselect all SmartList Selections" daily)   Diet/type: NPO w/ meds via tube tube feeds DVT prophylaxis: LMWH GI prophylaxis: PPI Lines: N/A Foley:  N/A Code Status:  full code Last date of multidisciplinary goals of care discussion [12/26 remains full code]  Critical care time:    The patient is critically ill with multiple organ system failure and requires high complexity decision making for assessment and support, frequent evaluation and titration of therapies, advanced monitoring, review of radiographic studies and interpretation of complex data.   Critical Care Time devoted to patient care services, exclusive of separately billable procedures, described in this note is 35 minutes.   Marshell Garfinkel MD [facility] See Amion for pager  If no response to pager , please call [phone] until 7pm After 7:00 pm call Elink  [phone] 12/15/2021, 9:34 AM

## 2021-12-15 NOTE — Progress Notes (Addendum)
STROKE TEAM PROGRESS NOTE   INTERVAL HISTORY Patient is seen in his room with wife at the bedside. He is awake but appears a little drowsy, he is able to follow commands. We will continue adjusting delirium medication for optimal wakefulness. He is on full support on the ventilator right now, attempt to wean and c-collar when able in an effort to transfer out of ICU.   Vitals:   12/15/21 0600 12/15/21 0700 12/15/21 0735 12/15/21 0800  BP: (!) 164/81 (!) 155/76  133/70  Pulse: 62 63 64 60  Resp: (!) 23 15 (!) 25 (!) 23  Temp:    99.1 F (37.3 C)  TempSrc:    Oral  SpO2: 98% 97% 100% 98%  Weight:      Height:       CBC:  Recent Labs  Lab 12/14/21 0329 12/15/21 0330  WBC 6.7 5.6  HGB 11.1* 10.8*  HCT 33.0* 32.7*  MCV 91.4 91.6  PLT 300 716   Basic Metabolic Panel:  Recent Labs  Lab 12/14/21 0329 12/15/21 0330  NA 140 142  K 3.9 3.8  CL 110 108  CO2 25 26  GLUCOSE 109* 107*  BUN 23 19  CREATININE 0.76 0.74  CALCIUM 7.8* 8.1*  MG 2.4  --   PHOS 2.9  --    Lipid Panel:  No results for input(s): CHOL, TRIG, HDL, CHOLHDL, VLDL, LDLCALC in the last 168 hours.   HgbA1c:  No results for input(s): HGBA1C in the last 168 hours.  Urine Drug Screen:  No results for input(s): LABOPIA, COCAINSCRNUR, LABBENZ, AMPHETMU, THCU, LABBARB in the last 168 hours.   Alcohol Level No results for input(s): ETH in the last 168 hours.  IMAGING past 24 hours No results found.  PHYSICAL EXAM  Temp:  [98.4 F (36.9 C)-99.3 F (37.4 C)] 99.1 F (37.3 C) (12/31 0800) Pulse Rate:  [50-64] 60 (12/31 0800) Resp:  [13-25] 23 (12/31 0800) BP: (91-164)/(53-94) 133/70 (12/31 0800) SpO2:  [96 %-100 %] 98 % (12/31 0800) FiO2 (%):  [30 %] 30 % (12/31 0735) Weight:  [89.8 kg] 89.8 kg (12/31 0500)  General - Well nourished, well developed, tracheostomy in place, drowsy, but following commands  Neurological: PERRL, blinks to threat bilaterally, tongue midline, able to move all four  extremities in response to commands. Moving all extremities spontaneously. WTP in all extremities. Smile symmetrical   ASSESSMENT/PLAN [name] is a 75 y.o. male with history of CABG x4, hyperlipidemia, prostate cancer, internal hemorrhoid, and a nonobstructive calculus in right interpole with hematuria presenting with acute onset vertigo nausea vomiting and an inability to walk.  At 10 AM on November 25, 2021 patient developed vertigo nausea and vomiting.  He also noted abdominal discomfort.  A code stroke was called on his arrival to triage.  Once the patient was in the CT scanner he developed more nausea and vomiting he went to a stat MRI which was positive for bilateral cerebellar strokes.  He then developed ataxia in his bilateral upper and lower extremities.  He was then given TNKase and went for a stat CTA of the head.  No LVO was shown on the CTA. Repeat MRI showed progression of edema related to the cerebellar infarcts with mass effect on the 4th ventricle. Neurosurgery consulted and hypertonic saline initiated. 12/13 suboccipital craniectomy overnight.  Patient tolerated procedure well.  Speech therapy recommends resuming nectar thickened liquids with his diet.  Repeat CT 12/14 shows hemorrhagic transformation of patient's stroke.  Lovenox resumed as he is post 24 hours from hemorrhagic transformation of his stroke.  Patient's agitation has improved with trial of clonazepam, and his neurological exam has improved.  Palliative care has been consulted to aid in [place] discussions.  Repeat head CT shows no additional bleeding. Repeat MRI shows improved brainstem compression. Abnormal restriction signal at bilateral caudate tails and splenium. Tracheostomy done 12/26. Plan for PEG tube 12/29.  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 ASPECTS 10.    Repeat CT-extensive swelling in the areas of the acute cerebellar infarction more extensive on the right than the left.  Mass-effect on the fourth ventricle.  No evidence of ventricular obstruction.  Stable lateral and third ventricles. Post craniectomy CT-Unchanged right greater than left cerebellar infarcts.  No new infarction.  Unchanged narrowing of the fourth ventricle.  No evidence of ventricular obstruction or hydrocephalus. Repeat CT 12/14 New hyperdensity in right cerebellar infarct, likely hemorrhage with no increased mass effect. CTA head & neck unremarkable MRI  acute infarct of the cerebellum bilaterally.  negative for hemorrhage.  MRI Repeat 12/12- Interval progression of edema associated to bilateral cerebellar acute infarcts with mass effect on the fourth ventricle and low lying right cerebellar tonsil. No hydrocephalus CT head 12/14 -right cerebellum hemorrhagic transformation without hydrocephalus CT serial repeats - stable postoperative changes involving infarct resolving intraventricular blood.  No acute abnormalities MRI repeat 12/26- stable right cerebellum infarct with hemorrhagic transformation, much improved brainstem compression, fourth ventricle now only minimal compression. abnormal restriction signal at bilateral caudate tails and splenium, concerning for cytotoxic changes 2D Echo EF 70-75% LDL 106 HgbA1c 5.8 VTE prophylaxis - SCDs aspirin 81 mg daily prior to admission, now on aspirin 81, plavix discontinued due to hemorrhagic transformation of cerebellar stroke. Will consider DOAC once CT stable and po access secured Therapy recommendations: Inpatient rehab- [facility] rehab closer to patients home Disposition: Pending  Cerebellar Edema s/p suboccipital decompression  3% normal saline initiated on 12/12 Sodium goal is 150-155 Na 156 S/p suboccipital decompression with Dr. Zada Finders 3% d/c'd and then resumed due to sodium levels  -> now off Close neuro monitoring CT 12/13 reveals unchanged infarcts without evidence of hydrocephalus CT head 12/14 - right cerebellum hemorrhagic transformation without hydrocephalus CT head serial repeats - Stable cerebellar infarct with hemorrhage.  No hydrocephalus or interval change Repeat MRI showed improved brainstem compression, abnormal signal at bilateral caudate tails and splenium  Hypertension -> hypotension Home meds: None fluctuates On clonidine 0.1 Q8  BP goal normotensive PRN hydralazine and levophed  Atrial flutter - new diagnosis Cardiology on board As needed metoprolol for rate control Will start IV heparin if no further bleeding seen on today's CT head Consider DOAC once CT stable - PEG placed 12/13/2021  Fever Leukocytosis Aspiration pneumonia Respiratory distress presumed aspiration pneumonia intubated 12/04/2021,then extubated Reintubated 12/26 due to respiratory distress, aspiration on secretions, tachypnea T-max 101.9-> afebrile->100.8-> 99-> 98.7 WBC 13.3->13.6->10.0->9.9-> 8.2->6.9-> 7.5->8.1-> 8.3-> 6.7->5.6 CXR 12/13 - Left lower lobe consolidation with small adjacent left pleural effusion. Findings are likely either due to pneumonia or aspiration. CXR 12/26 - Small left-sided pleural effusion with worsening consolidation at the left base. On unasyn -> rocephin Plan to re-try trach collar today -> if tolerating, will transfer to floor tomorrow  Hyperlipidemia Home meds: Atorvastatin 80 mg, resumed in hospital LDL 106, goal < 70  Atorvastatin 80 mg daily Continue statin at discharge  Delirium/agitation Encephalopathy Precedex stopped due to bradycardia Fentanyl infusing after intubation Klonopin increased to 0.5mg  QHS Olanzapine ODT 5 mg Q8h MRI 12/26 concerning for b/l caudate tails/splenium restriction - cytotoxic changes - may be able to explain pt delirium/agitation  Correct metabolic underlying cause Avoid significant electrolyte  changes, avoid neurotoxic medications Repeat MRI in 1-2 months at outpt follow up Emphasize sleep hygiene Consider psychiatry consult if not improved  Dysphagia NPO for now MBSS done 12/16 NG placed 12/26- TF infusion PEG placed 12/13/2021 Tube feedings infusing  Other Stroke Risk Factors Advanced Age >/= 65  Obesity, Body mass index is 28.41 kg/m., BMI >/= 30 associated with increased.  Use of oral blood pressure medications.  Risk, recommend weight loss, diet and exercise as appropriate  Coronary artery disease s/p CABG  Other Active Problems Abdominal discomfort CTA abdomen pelvis-no evidence of active GI bleed.  Moderate aortic atherosclerosis.  Diverticulosis with no evidence of diverticulitis. Internal hemorrhoid Nonobstructive calculus in right interpole Hematuria one month ago Urecholine 10 mg 3 times daily  Hospital day # 20  Patient seen and examined by NP/APP with MD. MD to update note as needed.   Janine Ores, DNP, FNP-BC [facility] Pager: [phone]  ATTENDING ATTESTATION:  Is a patient with bilateral cerebellar infarcts and brainstem compression status post decompression.  He has been trached and pegged.  He is still on vent support and difficult to wean.  He is still confused and agitated trying to pull out his tubes.  He is on olanzapine and Klonopin.  Discussed with CCM.  He received sedation for his trach and may need a few days to get this out of his system.  We will continue to minimize sedation with Klonopin only in the evening.  Rehab is following and recommend inpatient rehab after discharge  Dr. Reeves Forth evaluated pt independently, reviewed imaging, chart, labs. Discussed and formulated plan with the APP. Please see APP note above for details.      This patient is critically ill due to respiratory distress, stroke s/p tPA and at significant risk of neurological worsening, death form heart failure, respiratory failure, recurrent stroke,  bleeding from [facility], seizure, sepsis. This patient's care requires constant monitoring of vital signs, hemodynamics, respiratory and cardiac monitoring, review of multiple databases, neurological assessment, discussion with family, other specialists and medical decision making of high complexity. I spent 35 minutes of neurocritical care time in the care of this patient.   Kenley Rettinger,MD    To contact Stroke Continuity provider, please refer to [email or web address] After hours, contact General Neurology

## 2021-12-15 NOTE — Progress Notes (Signed)
Palliative Medicine Inpatient Follow Up Note  Consulting Provider: Mick Sell, PA-C   Reason for consult:   [name] Palliative Medicine Consult  Reason for Consult? family trying to decide if we extubate if he would want to be reintubated and would he have good quality of life    HPI:  Per intake H&P --> [name] is a 75 y.o. male with history of CABG x4, hyperlipidemia, prostate cancer, internal hemorrhoid, and a nonobstructive calculus in right interpole with hematuria presenting with acute onset vertigo nausea vomiting and an inability to walk.  At 10 AM on November 25, 2021 patient developed vertigo nausea and vomiting.  He also noted abdominal discomfort.  A code stroke was called on his arrival to triage.  Once the patient was in the CT scanner he developed more nausea and vomiting he went to a stat MRI which was positive for bilateral cerebellar strokes.  He then developed ataxia in his bilateral upper and lower extremities.  He was then given TNKase and went for a stat CTA of the head.  No LVO was shown on the CTA. Repeat MRI showed progression of edema related to the cerebellar infarcts with mass effect on the 4th ventricle. Neurosurgery consulted and hypertonic saline initiated. 12/13 suboccipital craniectomy overnight.  Patient tolerated procedure well.  Speech therapy recommends resuming nectar thickened liquids with his diet.  Repeat CT 12/14 shows hemorrhagic transformation of patient's stroke.  Lovenox resumed today as he is post 24 hours from hemorrhagic transformation of his stroke.Palliative care was consulted to discuss goals of care in the setting of recent stroke and aspiration event requiring re-intubation.  Today's Discussion (12/15/2021):  *Please note that this is a verbal dictation therefore any spelling or grammatical errors are due to the "[employer]" system interpretation.  Chart reviewed.   I met with [name] and his  spouse, [name] at bedside. We reviewed the events of the week. [name] endorses that [name] is doing better than he had been last week. Reviewed that the tracheostomy and gastric tube had been placed. [name] seems to think delirium is a bit better with [name] on zyprexa.   We reviewed the hope for [name] to get off of the ventilator and to be able to go to a step down unit. [name] shares that it is then that they can start planning for his transfer ideally to the [facility] rehabilitation.   Sat - provided emotional support through therapeutic listening. Chrishon's son will be arriving in town this evening.   Objective Assessment: Vital Signs Vitals:   12/15/21 1200 12/15/21 1300  BP: 121/62 114/87  Pulse: (!) 51 61  Resp: (!) 8 (!) 23  Temp: 98.7 F (37.1 C)   SpO2: 98% 98%    Intake/Output Summary (Last 24 hours) at 12/15/2021 1403 Last data filed at 12/15/2021 0600 Gross per 24 hour  Intake 1530 ml  Output 2200 ml  Net -670 ml    Last Weight  Most recent update: 12/15/2021  6:06 AM    Weight  89.8 kg (197 lb 15.6 oz)            Gen:  Elderly Caucasian M in NAD HEENT: dry mucous membranes, trach in place CV: Regular rate and irregular rhythm PULM: Ventilator ABD: gatric tube EXT: Pedal edema Neuro: On sedation   SUMMARY OF RECOMMENDATIONS   Full Code / Full Scope  Living will obtained   Will need acute rehabilitation when discharged  Patients family  continue to hope for improvements   Ongoing Palliative Team support - we will remain peripherally involved family has our information if they would like to speak further  Time Spent: 35 Greater than 50% of the time was spent in counseling and coordination of care ______________________________________________________________________________________ [place] Team Team Cell Phone: [phone] Please utilize secure chat with additional questions, if there is no response within 30  minutes please call the above phone number  Palliative Medicine Team providers are available by phone from 7am to 7pm daily and can be reached through the team cell phone.  Should this patient require assistance outside of these hours, please call the patient's attending physician.

## 2021-12-16 ENCOUNTER — Encounter (HOSPITAL_COMMUNITY): Payer: Self-pay | Admitting: Surgery

## 2021-12-16 DIAGNOSIS — I639 Cerebral infarction, unspecified: Secondary | ICD-10-CM | POA: Diagnosis not present

## 2021-12-16 LAB — CBC
HCT: 35 % — ABNORMAL LOW (ref 39.0–52.0)
Hemoglobin: 11.7 g/dL — ABNORMAL LOW (ref 13.0–17.0)
MCH: 30.9 pg (ref 26.0–34.0)
MCHC: 33.4 g/dL (ref 30.0–36.0)
MCV: 92.3 fL (ref 80.0–100.0)
Platelets: 259 10*3/uL (ref 150–400)
RBC: 3.79 MIL/uL — ABNORMAL LOW (ref 4.22–5.81)
RDW: 14.3 % (ref 11.5–15.5)
WBC: 5.9 10*3/uL (ref 4.0–10.5)
nRBC: 0 % (ref 0.0–0.2)

## 2021-12-16 LAB — BASIC METABOLIC PANEL
Anion gap: 8 (ref 5–15)
BUN: 19 mg/dL (ref 8–23)
CO2: 24 mmol/L (ref 22–32)
Calcium: 8.1 mg/dL — ABNORMAL LOW (ref 8.9–10.3)
Chloride: 107 mmol/L (ref 98–111)
Creatinine, Ser: 0.7 mg/dL (ref 0.61–1.24)
GFR, Estimated: >60 mL/min (ref >60)
Glucose, Bld: 139 mg/dL — ABNORMAL HIGH (ref 70–99)
Potassium: 3.9 mmol/L (ref 3.5–5.1)
Sodium: 139 mmol/L (ref 135–145)

## 2021-12-16 LAB — GLUCOSE, CAPILLARY
Glucose-Capillary: 130 mg/dL — ABNORMAL HIGH (ref 70–99)
Glucose-Capillary: 140 mg/dL — ABNORMAL HIGH (ref 70–99)
Glucose-Capillary: 119 mg/dL — ABNORMAL HIGH (ref 70–99)
Glucose-Capillary: 132 mg/dL — ABNORMAL HIGH (ref 70–99)
Glucose-Capillary: 141 mg/dL — ABNORMAL HIGH (ref 70–99)

## 2021-12-16 MED ORDER — CARVEDILOL 3.125 MG PO TABS
3.1250 mg | ORAL_TABLET | Freq: Two times a day (BID) | ORAL | Status: DC
  Administered 2021-12-17: 3.125 mg
  Filled 2021-12-16: qty 1

## 2021-12-16 NOTE — Progress Notes (Addendum)
STROKE TEAM PROGRESS NOTE   INTERVAL HISTORY Patient is seen in his room with wife [name]) and son (Rob) at the bedside. He is awake but appears a little drowsy, he is able to follow commands. We will continue adjusting delirium medication for optimal wakefulness. On trach collar right now. Plan to move out of ICU tomorrow if he tolerates trach collar over night.   Vitals:   12/16/21 0700 12/16/21 0741 12/16/21 0800 12/16/21 0900  BP: (!) 114/58  127/62 133/68  Pulse: (!) 47 (!) 48 (!) 48 (!) 55  Resp: 18 (!) 23 (!) 22 (!) 21  Temp:   99 F (37.2 C)   TempSrc:   Axillary   SpO2: 99% 100% 100% 100%  Weight:      Height:       CBC:  Recent Labs  Lab 12/14/21 0329 12/15/21 0330  WBC 6.7 5.6  HGB 11.1* 10.8*  HCT 33.0* 32.7*  MCV 91.4 91.6  PLT 300 784    Basic Metabolic Panel:  Recent Labs  Lab 12/14/21 0329 12/15/21 0330  NA 140 142  K 3.9 3.8  CL 110 108  CO2 25 26  GLUCOSE 109* 107*  BUN 23 19  CREATININE 0.76 0.74  CALCIUM 7.8* 8.1*  MG 2.4  --   PHOS 2.9  --     Lipid Panel:  No results for input(s): CHOL, TRIG, HDL, CHOLHDL, VLDL, LDLCALC in the last 168 hours.   HgbA1c:  No results for input(s): HGBA1C in the last 168 hours.  Urine Drug Screen:  No results for input(s): LABOPIA, COCAINSCRNUR, LABBENZ, AMPHETMU, THCU, LABBARB in the last 168 hours.   Alcohol Level No results for input(s): ETH in the last 168 hours.  IMAGING past 24 hours No results found.  PHYSICAL EXAM  Temp:  [98.4 F (36.9 C)-99.7 F (37.6 C)] 99 F (37.2 C) (01/01 0800) Pulse Rate:  [47-71] 55 (01/01 0900) Resp:  [8-43] 21 (01/01 0900) BP: (102-159)/(56-87) 133/68 (01/01 0900) SpO2:  [98 %-100 %] 100 % (01/01 0900) FiO2 (%):  [28 %-30 %] 28 % (01/01 0858) Weight:  [89.8 kg] 89.8 kg (01/01 0500)  General - Well nourished, well developed, tracheostomy in place, alert and following commands  Neurological: PERRL, blinks to threat bilaterally, tongue midline, able to move  all four extremities in response to commands. Moving all extremities spontaneously. WTP in all extremities. Smile symmetrical   ASSESSMENT/PLAN Mr. Raye Slyter is a 76 y.o. male with history of CABG x4, hyperlipidemia, prostate cancer, internal hemorrhoid, and a nonobstructive calculus in right interpole with hematuria presenting with acute onset vertigo nausea vomiting and an inability to walk.  At 10 AM on November 25, 2021 patient developed vertigo nausea and vomiting.  He also noted abdominal discomfort.  A code stroke was called on his arrival to triage.  Once the patient was in the CT scanner he developed more nausea and vomiting he went to a stat MRI which was positive for bilateral cerebellar strokes.  He then developed ataxia in his bilateral upper and lower extremities.  He was then given TNKase and went for a stat CTA of the head.  No LVO was shown on the CTA. Repeat MRI showed progression of edema related to the cerebellar infarcts with mass effect on the 4th ventricle. Neurosurgery consulted and hypertonic saline initiated. 12/13 suboccipital craniectomy overnight.  Patient tolerated procedure well.  Speech therapy recommends resuming nectar thickened liquids with his diet.  Repeat CT 12/14 shows hemorrhagic transformation  of patient's stroke.  Lovenox resumed as he is post 24 hours from hemorrhagic transformation of his stroke.  Patient's agitation has improved with trial of clonazepam, and his neurological exam has improved.  Palliative care has been consulted to aid in Leonore discussions.  Repeat head CT shows no additional bleeding. Repeat MRI shows improved brainstem compression. Abnormal restriction signal at bilateral caudate tails and splenium. Tracheostomy done 12/26. Plan for PEG tube 12/29.  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 ASPECTS 10.    Repeat CT-extensive swelling in the areas of the acute cerebellar infarction more extensive on the right than the left.  Mass-effect on the fourth ventricle.  No evidence of ventricular obstruction.  Stable lateral and third ventricles. Post craniectomy CT-Unchanged right greater than left cerebellar infarcts.  No new infarction.  Unchanged narrowing of the fourth ventricle.  No evidence of ventricular obstruction or hydrocephalus. Repeat CT 12/14 New hyperdensity in right cerebellar infarct, likely hemorrhage with no increased mass effect. CTA head & neck unremarkable MRI  acute infarct of the cerebellum bilaterally.  negative for hemorrhage.  MRI Repeat 12/12- Interval progression of edema associated to bilateral cerebellar acute infarcts with mass effect on the fourth ventricle and low lying right cerebellar tonsil. No hydrocephalus CT head 12/14 -right cerebellum hemorrhagic transformation without hydrocephalus CT serial repeats - stable postoperative changes involving infarct resolving intraventricular blood.  No acute abnormalities MRI repeat 12/26- stable right cerebellum infarct with hemorrhagic transformation, much improved brainstem compression, fourth ventricle now only minimal compression. abnormal restriction signal at bilateral caudate tails and splenium, concerning for cytotoxic changes 2D Echo EF 70-75% LDL 106 HgbA1c 5.8 VTE prophylaxis - SCDs aspirin 81 mg daily prior to admission, now on aspirin 81, plavix discontinued due to hemorrhagic transformation of cerebellar stroke. Will consider DOAC once CT stable and po access secured Therapy recommendations: Inpatient rehab- [facility] rehab closer to patients home Disposition: Pending  Cerebellar Edema s/p suboccipital decompression  3% normal saline initiated on 12/12 Sodium goal is 150-155 Na 156 S/p suboccipital decompression with Dr. Zada Finders 3% d/c'd and then resumed due to  sodium levels -> now off Close neuro monitoring CT 12/13 reveals unchanged infarcts without evidence of hydrocephalus CT head 12/14 - right cerebellum hemorrhagic transformation without hydrocephalus CT head serial repeats - Stable cerebellar infarct with hemorrhage.  No hydrocephalus or interval change Repeat MRI showed improved brainstem compression, abnormal signal at bilateral caudate tails and splenium  Hypertension -> hypotension Home meds: None fluctuates On clonidine 0.1 Q8  BP goal normotensive PRN hydralazine and levophed  Atrial flutter - new diagnosis Cardiology on board As needed metoprolol for rate control Will start IV heparin if no further bleeding seen on today's CT head Consider DOAC once CT stable - PEG placed 12/13/2021  Fever Leukocytosis Aspiration pneumonia Respiratory distress presumed aspiration pneumonia intubated 12/04/2021,then extubated Reintubated 12/26 due to respiratory distress, aspiration on secretions, tachypnea T-max 101.9-> afebrile->100.8-> 99-> 98.7 WBC 13.3->13.6->10.0->9.9-> 8.2->6.9-> 7.5->8.1-> 8.3-> 6.7->5.6 CXR 12/13 - Left lower lobe consolidation with small adjacent left pleural effusion. Findings are likely either due to pneumonia or aspiration. CXR 12/26 - Small left-sided pleural effusion with worsening consolidation at the left base. On unasyn -> rocephin Plan to re-try trach collar today -> if tolerating, will transfer to floor tomorrow  Hyperlipidemia Home meds: Atorvastatin 80 mg, resumed in hospital LDL  106, goal < 70 Atorvastatin 80 mg daily Continue statin at discharge  Delirium/agitation Encephalopathy Precedex stopped due to bradycardia Fentanyl infusing after intubation Klonopin  0.5mg  QHS Olanzapine ODT 5 mg Q8h MRI 12/26 concerning for b/l caudate tails/splenium restriction - cytotoxic changes - may be able to explain pt delirium/agitation  Correct metabolic underlying cause Avoid significant electrolyte  changes, avoid neurotoxic medications Repeat MRI in 1-2 months at outpt follow up Emphasize sleep hygiene Consider psychiatry consult if not improved  Dysphagia NPO for now MBSS done 12/16 NG placed 12/26- TF infusion PEG placed 12/13/2021 Tube feedings infusing  Other Stroke Risk Factors Advanced Age >/= 65  Obesity, Body mass index is 28.41 kg/m., BMI >/= 30 associated with increased.  Use of oral blood pressure medications.  Risk, recommend weight loss, diet and exercise as appropriate  Coronary artery disease s/p CABG  Other Active Problems Abdominal discomfort CTA abdomen pelvis-no evidence of active GI bleed.  Moderate aortic atherosclerosis.  Diverticulosis with no evidence of diverticulitis. Internal hemorrhoid Nonobstructive calculus in right interpole Hematuria one month ago Urecholine 10 mg 3 times daily  Hospital day # 21  Patient seen and examined by NP/APP with MD. MD to update note as needed.   Janine Ores, DNP, FNP-BC [facility] Pager: [phone]  ATTENDING ATTESTATION:  bilateral cerebellar infarcts and brainstem compression status post decompression.  s/p trach and peg.  Doing better with delerium. He can follow commands and answer yes/no questions. Able to lift both arms/legs off bed to command. He is on olanzapine q8 and Klonopin qhs.  Discussed with CCM. Weaned off vent. If he tolerates Trach collar overnight then possible transfer out of ICU tomorrow.  I had [name] go up to have a long discussion with patient's son who flew in from [place] to give him a detailed update on his ICU course. His wife, [name], really appreciated this.   Dr. Reeves Forth evaluated pt independently, reviewed imaging, chart, labs. Discussed and formulated plan with the APP. Please see APP note above for details.      This patient is critically ill due to respiratory distress, aflutter, ICH and at significant risk of neurological worsening, death form heart  failure, respiratory failure, recurrent stroke, bleeding from [facility], seizure, sepsis. This patient's care requires constant monitoring of vital signs, hemodynamics, respiratory and cardiac monitoring, review of multiple databases, neurological assessment, discussion with family, other specialists and medical decision making of high complexity. I spent 35 minutes of neurocritical care time in the care of this patient.   Tiziana Cislo,MD   To contact Stroke Continuity provider, please refer to [email or web address] After hours, contact General Neurology

## 2021-12-16 NOTE — Progress Notes (Signed)
Pt refused Fingerprick for CBG; this RN reeducated on CBG importance, and pt still refused; Pt VS stable, Will retry CBG at 0000.  Royal Piedra, RN

## 2021-12-16 NOTE — Progress Notes (Addendum)
NAME:  [name], MRN:  [id number], DOB:  [birth year], LOS: 21 ADMISSION DATE:  11/25/2021, CONSULTATION DATE:  12/13 REFERRING MD:  Marcelle Overlie, CHIEF COMPLAINT:  Stroke symptoms   History of Present Illness:  75 y/o male admitted with a cerebellar stroke on 12/11, received TNKase, he developed increasing mass effect on the 4th ventricle, moved to the ICU. 12/13 he had a suboccipital craniectomy.  Repeat CT 12/14 showed hemorrhagic conversion.  Pertinent  Medical History  CAD, s/p CABG  HLD  OA,  Nephrolithiasis BPH  Prostate cancer   Significant Hospital Events: Including procedures, antibiotic start and stop dates in addition to other pertinent events   12/11 admission, received TNKase 12/13 worsening edema/mass effect, had emergent craniotomy 12/14 hemorrhagic transformation of stroke 12/20 Finished unasyn for aspiration PNA, re intubated 12/22 Extubated 12/23 Palliative consulted 12/26 Re intubated. Tracheostomy performed, Finished ceftriaxone MRI> no new acute ischemia, improvement in brainstem compression, and abnormal signal at bilateral caudate tails and splenium  12/27 trach collar trials 12/28 off vent overnight  12/29 PEG, hypotensive post procedure, back on vent, CT head with no acute hemmorhage  Interim History / Subjective:   On trach collar today, more awake and calm.  Wife and son are at bedside  Objective   Blood pressure (!) 157/67, pulse 62, temperature 100.1 F (37.8 C), temperature source Axillary, resp. rate 18, height 5\' 10"  (1.778 m), weight 89.5 kg, SpO2 94 %.    Vent Mode: Stand-by FiO2 (%):  [28 %-30 %] 28 % Set Rate:  [18 bmp] 18 bmp Vt Set:  [480 mL] 480 mL PEEP:  [5 cmH20] 5 cmH20 Pressure Support:  [10 YFV49-44 cmH20] 12 cmH20 Plateau Pressure:  [17 cmH20] 17 cmH20   Intake/Output Summary (Last 24 hours) at 12/16/2021 9675 Last data filed at 12/16/2021 0900 Gross per 24 hour  Intake 1205 ml  Output 850 ml  Net 355 ml   Filed Weights    12/14/21 0500 12/15/21 0500 12/16/21 0500  Weight: 89.8 kg 89.8 kg 89.8 kg    Examination: Gen:      No acute distress HEENT:  EOMI, sclera anicteric Neck:     No masses; no thyromegaly, trach Lungs:    Clear to auscultation bilaterally; normal respiratory effort CV:         Regular rate and rhythm; no murmurs Abd:      + bowel sounds; soft, non-tender; no palpable masses, no distension Ext:    No edema; adequate peripheral perfusion Skin:      Warm and dry; no rash Neuro: Somnolent, arousable, follows commands  Labs/imaging reviewed No new labs or imaging  Resolved Issues Hypernatremia Hypokalemia Hypotension s/p PEG placement   Assessment & Plan:  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 -Continue ASA, plavix held d/t hemorraghic conversion -Repeat MRI in 1-2 months, will need outpatient neurology follow up -Will need rehab/LTAC post hospital, looking at [facility] inpatient rehab which is close to home  Acute hypoxic respiratory failure, s/p tracheostomy Possible aspiration pneumonia Right lower lobe pneumonia - s/p 7 days Unasyn and 7 days of ceftriaxone, Off vent since 10 AM 12/28. Resumed s/p PeG Continue vent support Pressure support weans.  Hopefully he can get back to trach collar Follow intermittent chest x-ray tube feeds  Dysphagia  - Modified barium swallow showing aspiration, now s/p -  Peg placed 12/29 -management per GS  Chronic HFpEF Hypertension, uncontrolled CAD HX HLD -Clonidine, Carvedilol 12.5 bid, amlodipine and losartan  -Continue ASA/Statin  Hyperactive delirium> Improving zyprexa 5mg  BID  Proximal atrial flutter -Coreg  BPH -Continue urecholine -Monitor UOP  GOC -PC following  Best Practice (right click and "Reselect  all SmartList Selections" daily)   Diet/type: NPO w/ meds via tube tube feeds DVT prophylaxis: LMWH GI prophylaxis: PPI Lines: N/A Foley:  N/A Code Status:  full code Last date of multidisciplinary goals of care discussion [12/26 remains full code]  Critical care time: NA   Marshell Garfinkel MD [facility] See Amion for pager  If no response to pager , please call [phone] until 7pm After 7:00 pm call Elink  [phone] 12/16/2021, 9:26 AM

## 2021-12-17 DIAGNOSIS — I639 Cerebral infarction, unspecified: Secondary | ICD-10-CM | POA: Diagnosis not present

## 2021-12-17 LAB — GLUCOSE, CAPILLARY
Glucose-Capillary: 137 mg/dL — ABNORMAL HIGH (ref 70–99)
Glucose-Capillary: 139 mg/dL — ABNORMAL HIGH (ref 70–99)
Glucose-Capillary: 130 mg/dL — ABNORMAL HIGH (ref 70–99)
Glucose-Capillary: 124 mg/dL — ABNORMAL HIGH (ref 70–99)
Glucose-Capillary: 120 mg/dL — ABNORMAL HIGH (ref 70–99)

## 2021-12-17 MED ORDER — MIDAZOLAM HCL 2 MG/2ML IJ SOLN
INTRAMUSCULAR | Status: AC
  Filled 2021-12-17: qty 2

## 2021-12-17 MED ORDER — APIXABAN 5 MG PO TABS
5.0000 mg | ORAL_TABLET | Freq: Two times a day (BID) | ORAL | Status: DC
  Administered 2021-12-18 – 2021-12-25 (×15): 5 mg
  Filled 2021-12-17 (×15): qty 1

## 2021-12-17 MED ORDER — APIXABAN 5 MG PO TABS: 5.0000 mg | ORAL_TABLET | Freq: Two times a day (BID) | ORAL | Status: DC

## 2021-12-17 NOTE — Progress Notes (Signed)
Trach sutures removed per MD. New trach ties secured in place.

## 2021-12-17 NOTE — Progress Notes (Signed)
Occupational Therapy Treatment Patient Details Name: [name] MRN: [id number] DOB: May 30, 1946 Today's Date: 12/17/2021   History of present illness 76 yo male presenting to ED on 12/11 with ataxia and vertigo. 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 TNK given at 1214. S/p suboccipital crani for evacuation on 12/13. Required intubation 12/20-12/22. 12/26 trach 12/27 trach trials 12/29 Peg PMH including CAD s/p CABG x4, HLD, OA, nephrolithiasis, urinary retention, BPH with LUTS and prostate cancer.   OT comments  Pt progressed from bed to static standing this session with total +2 (A). Pt following multiple step commands with cues for sequence. Pt mouthing at therapists words. Pt attempting to write with R hand. Pt with right hand decreased grasp and coordination noted. Pt appears to recognize wife but does not mouth name when asked. Pt impulsive and continues to benefit from wrist restraints to keep lines and leads safe. Pt attempting to remove trach collar with R UE this session.   Wife requests CIR at [facility].    Recommendations for follow up therapy are one component of a multi-disciplinary discharge planning process, led by the attending physician.  Recommendations may be updated based on patient status, additional functional criteria and insurance authorization.    Follow Up Recommendations  Acute inpatient rehab (3hours/day)    Assistance Recommended at Discharge Frequent or constant Supervision/Assistance  Equipment Recommendations  BSC/3in1    Recommendations for Other Services PT consult;Rehab consult;Speech consult    Precautions / Restrictions Precautions Precautions: Fall Precaution Comments: trach, peg, foley Restrictions Weight Bearing Restrictions: No Other Position/Activity Restrictions: Wears a lift in R shoe s/p hip replacement on L causing leg length discrepency       Mobility Bed Mobility Overal bed mobility:  Needs Assistance Bed Mobility: Rolling;Supine to Sit;Sit to Supine Rolling: Mod assist   Supine to sit: +2 for physical assistance;Mod assist Sit to supine: Mod assist   General bed mobility comments: pt initiated R side exit of the bed. with cues to sequence. pt requires (A) to bring trunk off bed surface and pt attmepting to push up with UE. pt requires (A) for static sitting balance. pt attempting to stand prematurely and needs cues to keep safe at eob. pt initiates returns to supine with cues for safety from staff    Transfers Overall transfer level: Needs assistance Equipment used: 2 person hand held assist Transfers: Sit to/from Stand Sit to Stand: +2 physical assistance;Mod assist           General transfer comment: pt achieved full upright posture and extends spine with hygiene due to discomfort.     Balance Overall balance assessment: Needs assistance Sitting-balance support: Bilateral upper extremity supported;Feet supported Sitting balance-Leahy Scale: Poor     Standing balance support: Bilateral upper extremity supported Standing balance-Leahy Scale: Zero                             ADL either performed or assessed with clinical judgement   ADL Overall ADL's : Needs assistance/impaired Eating/Feeding: NPO Eating/Feeding Details (indicate cue type and reason): peg Grooming: Maximal assistance   Upper Body Bathing: Maximal assistance   Lower Body Bathing: Total assistance                         General ADL Comments: incontinence of stool on arrival smeared on bed with red blood. Rn called to the room and  addressing area of concern. pt with history of hemorroids noted from wife. pt completed sit<>Stand from eob for hygiene and repositioning    Extremity/Trunk Assessment Upper Extremity Assessment Upper Extremity Assessment: Difficult to assess due to impaired cognition RUE Deficits / Details: R UE weakness noted and decreased  coordination. pt completed hand to noses to therapist hand and back on command. pt undershooting iwth task. pt with decreased strength to sustain against gravity RUE Coordination: decreased gross motor;decreased fine motor   Lower Extremity Assessment Lower Extremity Assessment: Defer to PT evaluation        Vision   Additional Comments: pt reports "4" with mouthing words when asked if he was seeing two. Wife states "he likes to joke" visual assessment to be continued but does response more direct with occlusion of one eye. pt with R eye not as open at L eye even when awake and attending   Perception     Praxis      Cognition Arousal/Alertness: Awake/alert Behavior During Therapy: Restless;Flat affect;Impulsive Overall Cognitive Status: Difficult to assess Area of Impairment: Following commands;Awareness;Attention;Safety/judgement                   Current Attention Level: Sustained   Following Commands: Follows one step commands inconsistently;Follows one step commands with increased time Safety/Judgement: Decreased awareness of safety;Decreased awareness of deficits Awareness: Intellectual Problem Solving: Slow processing;Difficulty sequencing General Comments: pt attempting to write name on command and given marker. pt makes a mark that appearst to match a "T" and pt goes by [employer]. Family with a visual aide for selecting needs with >8 words on page. Pt given two words yes and NO and able to point to yes with visual occlusion. question if visual component a factor in addition to cognition. pt pulling at lines impulsively with cues to avoid lines.          Exercises     Shoulder Instructions       General Comments trach collar    Pertinent Vitals/ Pain       Pain Assessment: Faces Pain Score: 0-No pain Faces Pain Scale: No hurt Facial Expression: Relaxed, neutral Body Movements: Absence of movements Muscle Tension: Relaxed Compliance with ventilator (intubated  pts.): N/A Vocalization (extubated pts.): Talking in normal tone or no sound CPOT Total: 0  Home Living                                          Prior Functioning/Environment              Frequency  Min 2X/week        Progress Toward Goals  OT Goals(current goals can now be found in the care plan section)  Progress towards OT goals: Progressing toward goals  Acute Rehab OT Goals Patient Stated Goal: wife wants him to go to rehab at [facility] OT Goal Formulation: Patient unable to participate in goal setting Time For Goal Achievement: 12/26/21 Potential to Achieve Goals: Good ADL Goals Pt Will Perform Grooming: with set-up;with supervision;sitting Pt Will Perform Lower Body Dressing: with min assist;sit to/from stand Pt Will Transfer to Toilet: with min assist;stand pivot transfer;bedside commode Pt Will Perform Toileting - Clothing Manipulation and hygiene: with min guard assist;sitting/lateral leans Additional ADL Goal #1: Pt will complete bed mobility mod (A) as precursor to adls.  Plan Discharge plan needs to be updated    Co-evaluation  PT/OT/SLP Co-Evaluation/Treatment: Yes Reason for Co-Treatment: Complexity of the patient's impairments (multi-system involvement);For patient/therapist safety;Necessary to address cognition/behavior during functional activity;To address functional/ADL transfers PT goals addressed during session: Mobility/safety with mobility;Balance OT goals addressed during session: Proper use of Adaptive equipment and DME;ADL's and self-care;Strengthening/ROM      AM-PAC OT "6 Clicks" Daily Activity     Outcome Measure   Help from another person eating meals?: A Lot Help from another person taking care of personal grooming?: A Lot Help from another person toileting, which includes using toliet, bedpan, or urinal?: A Lot Help from another person bathing (including washing, rinsing, drying)?: A Lot Help from another person to  put on and taking off regular upper body clothing?: A Lot Help from another person to put on and taking off regular lower body clothing?: Total 6 Click Score: 11    End of Session Equipment Utilized During Treatment: Oxygen  OT Visit Diagnosis: Unsteadiness on feet (R26.81);Other abnormalities of gait and mobility (R26.89);Muscle weakness (generalized) (M62.81);History of falling (Z91.81)   Activity Tolerance Patient tolerated treatment well   Patient Left in bed;with call bell/phone within reach;with bed alarm set;with family/visitor present;with restraints reapplied   Nurse Communication Mobility status        Time: 3016-0109 OT Time Calculation (min): 29 min  Charges: OT General Charges $OT Visit: 1 Visit OT Treatments $Self Care/Home Management : 8-22 mins   Brynn, OTR/L  Acute Rehabilitation Services Pager: [phone] Office: [phone] .   Jeri Modena 12/17/2021, 11:48 AM

## 2021-12-17 NOTE — Progress Notes (Signed)
[place] for apixaban Indication: atrial fibrillation  Labs: Recent Labs    12/15/21 0330 12/16/21 0945  HGB 10.8* 11.7*  HCT 32.7* 35.0*  PLT 273 259  CREATININE 0.74 0.70    Assessment: 41 yom presenting with cerebellar stroke on 12/11 s/p TNK, developed increasing mass effect on 4th ventricle, s/p craniectomy 12/13. Repeat CT 12/14 showed hemorrhagic conversion. Pharmacy consulted to start apixaban for afib per Neurology, as now more than 2 weeks post-hemorrhagic transformation. Neurology d/c'd aspirin. CBC stable. Patient currently on Lovenox for VTE prophylaxis - last dose 1/2 AM.  Goal of Therapy:  Stroke prevention Monitor platelets by anticoagulation protocol: Yes   Plan:  D/c Lovenox prophylaxis Start apixaban 5mg  PO BID Monitor CBC, SCr, s/sx bleeding   Arturo Morton, PharmD, BCPS Clinical Pharmacist 12/17/2021 3:21 PM

## 2021-12-17 NOTE — Progress Notes (Addendum)
NAME:  Juandedios Dudash, MRN:  [id number], DOB:  07-02-1946, LOS: 66 ADMISSION DATE:  11/25/2021, CONSULTATION DATE:  12/13 REFERRING MD:  Marcelle Overlie, CHIEF COMPLAINT:  Stroke symptoms   History of Present Illness:  76 y/o male admitted with a cerebellar stroke on 12/11, received TNKase, he developed increasing mass effect on the 4th ventricle, moved to the ICU. 12/13 he had a suboccipital craniectomy.  Repeat CT 12/14 showed hemorrhagic conversion.  Pertinent  Medical History  CAD, s/p CABG  HLD  OA,  Nephrolithiasis BPH  Prostate cancer   Significant Hospital Events: Including procedures, antibiotic start and stop dates in addition to other pertinent events   12/11 admission, received TNKase 12/13 worsening edema/mass effect, had emergent craniotomy 12/14 hemorrhagic transformation of stroke 12/20 Finished unasyn for aspiration PNA, re intubated 12/22 Extubated 12/23 Palliative consulted 12/26 Re intubated. Tracheostomy performed, Finished ceftriaxone MRI> no new acute ischemia, improvement in brainstem compression, and abnormal signal at bilateral caudate tails and splenium  12/27 trach collar trials 12/28 off vent overnight  12/29 PEG, hypotensive post procedure, back on vent, CT head with no acute hemmorhage 1/1 Back to trach collar  Interim History / Subjective:   On trach collar for 24 hrs. He is tolerating it well.   Objective   Blood pressure (!) 157/67, pulse 62, temperature 100.1 F (37.8 C), temperature source Axillary, resp. rate 18, height 5\' 10"  (1.778 m), weight 89.5 kg, SpO2 94 %.    Vent Mode: Stand-by FiO2 (%):  [28 %-30 %] 28 % PEEP:  [5 cmH20] 5 cmH20 Pressure Support:  [12 cmH20] 12 cmH20   Intake/Output Summary (Last 24 hours) at 12/17/2021 9678 Last data filed at 12/17/2021 0700 Gross per 24 hour  Intake 2365 ml  Output 1850 ml  Net 515 ml   Filed Weights   12/14/21 0500 12/15/21 0500 12/16/21 0500  Weight: 89.8 kg 89.8 kg 89.8 kg     Examination: Gen:      No acute distress HEENT:  EOMI, sclera anicteric Neck:     No masses; no thyromegaly, Trach Lungs:    Clear to auscultation bilaterally; normal respiratory effort CV:         Regular rate and rhythm; no murmurs Abd:      + bowel sounds; soft, non-tender; no palpable masses, no distension Ext:    No edema; adequate peripheral perfusion Skin:      Warm and dry; no rash Neuro: Somnolent, arousable  Labs/imaging reviewed No new labs or imaging  Resolved Issues Hypernatremia Hypokalemia Hypotension s/p PEG placement  Assessment & Plan:  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 -Continue ASA, plavix held d/t hemorraghic conversion -Repeat MRI in 1-2 months, will need outpatient neurology follow up -Will need rehab/LTAC post hospital, looking at [facility] inpatient rehab which is close to home  Acute hypoxic respiratory failure, s/p tracheostomy Possible aspiration pneumonia Right lower lobe pneumonia - s/p 7 days Unasyn and 7 days of ceftriaxone, Off vent since 10 AM 12/28. Resumed s/p PeG Continue vent support Trach collar Follow intermittent chest x-ray  Dysphagia  - Modified barium swallow showing aspiration, now s/p -Peg placed 12/29 Management per GS  Chronic HFpEF Hypertension, uncontrolled CAD HX HLD -Clonidine, Carvedilol 12.5 bid, amlodipine and losartan  -Continue ASA/Statin  Hyperactive delirium> Improving zyprexa 5mg  BID  Proximal atrial flutter -  Coreg  BPH -Continue urecholine -Monitor UOP  GOC -PC following  Ok for transfer out of unit from our standpoint. PCCM will follow for trach management  Best Practice (right click and "Reselect all SmartList Selections" daily)   Diet/type: NPO w/ meds via tube tube  feeds DVT prophylaxis: LMWH GI prophylaxis: PPI Lines: N/A Foley:  N/A Code Status:  full code Last date of multidisciplinary goals of care discussion [12/26 remains full code]  Critical care time: NA   Marshell Garfinkel MD [facility] See Amion for pager  If no response to pager , please call [phone] until 7pm After 7:00 pm call Elink  [phone] 12/17/2021, 7:27 AM

## 2021-12-17 NOTE — Progress Notes (Signed)
Physical Therapy Treatment Patient Details Name: [name] MRN: [id number] DOB: Mar 09, 1946 Today's Date: 12/17/2021   History of Present Illness 76 yo male presenting to ED on 12/11 with ataxia and vertigo. 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 TNK given at 1214. S/p suboccipital crani for evacuation on 12/13. Required intubation 12/20-12/22. 12/26 trach 12/27 trach trials 12/29 Peg PMH including CAD s/p CABG x4, HLD, OA, nephrolithiasis, urinary retention, BPH with LUTS and prostate cancer.    PT Comments    Pt received in bed, wife present in room. Pt required +2 mod assist bed mobility, and +2 max assist sit to stand. He demonstrates poor sitting balance and zero standing balance. He fatigues quickly but his activity tolerance in improving. Pt more engaged and interactive with therapists this session. Following simple commands more consistently. VSS on trach collar. Pt supine in bed at end of session.    Recommendations for follow up therapy are one component of a multi-disciplinary discharge planning process, led by the attending physician.  Recommendations may be updated based on patient status, additional functional criteria and insurance authorization.  Follow Up Recommendations  Acute inpatient rehab (3hours/day) (from [facility], would like to go to [facility] rehab)     Assistance Recommended at Discharge Frequent or constant Supervision/Assistance  Equipment Recommendations  Other (comment) (TBD)    Recommendations for Other Services       Precautions / Restrictions Precautions Precautions: Fall Precaution Comments: trach, peg, foley Restrictions Weight Bearing Restrictions: No Other Position/Activity Restrictions: Wears a lift in R shoe s/p hip replacement on L causing leg length discrepency     Mobility  Bed Mobility Overal bed mobility: Needs Assistance Bed Mobility: Rolling;Supine to Sit;Sit to Supine Rolling: Mod  assist   Supine to sit: +2 for physical assistance;Mod assist Sit to supine: Mod assist;+2 for safety/equipment   General bed mobility comments: Good initiation noted by pt. Cues for sequencing. Assist with BLE and trunk    Transfers Overall transfer level: Needs assistance Equipment used: 2 person hand held assist Transfers: Sit to/from Stand Sit to Stand: +2 physical assistance;Max assist           General transfer comment: sit to stand x 3 trials from EOB. Assist to power up and control descent. Able to sustain stance < 30 seconds each trial.    Ambulation/Gait                   Stairs             Wheelchair Mobility    Modified Rankin (Stroke Patients Only) Modified Rankin (Stroke Patients Only) Pre-Morbid Rankin Score: No symptoms Modified Rankin: Severe disability     Balance Overall balance assessment: Needs assistance Sitting-balance support: Feet supported;Single extremity supported;Bilateral upper extremity supported Sitting balance-Leahy Scale: Poor     Standing balance support: Bilateral upper extremity supported Standing balance-Leahy Scale: Zero Standing balance comment: reliant on UE support and external assist                            Cognition Arousal/Alertness: Awake/alert Behavior During Therapy: Restless;Flat affect;Impulsive Overall Cognitive Status: Difficult to assess Area of Impairment: Following commands;Awareness;Attention;Safety/judgement                   Current Attention Level: Sustained   Following Commands: Follows one step commands inconsistently;Follows one step commands with increased time Safety/Judgement: Decreased awareness of safety;Decreased awareness  of deficits Awareness: Intellectual Problem Solving: Slow processing;Difficulty sequencing General Comments: pt attempting to write name on command and given marker. pt makes a mark that appearst to match a "T" and pt goes by [employer]. Family  with a visual aide for selecting needs with >8 words on page. Pt given two words yes and NO and able to point to yes with visual occlusion. question if visual component a factor in addition to cognition. pt pulling at lines impulsively with cues to avoid lines.        Exercises      General Comments General comments (skin integrity, edema, etc.): trach collar      Pertinent Vitals/Pain Pain Assessment: Faces Pain Score: 0-No pain Faces Pain Scale: No hurt Facial Expression: Relaxed, neutral Body Movements: Absence of movements Muscle Tension: Relaxed Compliance with ventilator (intubated pts.): N/A Vocalization (extubated pts.): Talking in normal tone or no sound CPOT Total: 0    Home Living                          Prior Function            PT Goals (current goals can now be found in the care plan section) Acute Rehab PT Goals Patient Stated Goal: [facility] rehab per wife Progress towards PT goals: Progressing toward goals    Frequency    Min 4X/week      PT Plan Current plan remains appropriate    Co-evaluation PT/OT/SLP Co-Evaluation/Treatment: Yes Reason for Co-Treatment: Complexity of the patient's impairments (multi-system involvement);For patient/therapist safety;Necessary to address cognition/behavior during functional activity;To address functional/ADL transfers PT goals addressed during session: Mobility/safety with mobility;Balance OT goals addressed during session: Proper use of Adaptive equipment and DME;ADL's and self-care;Strengthening/ROM      AM-PAC PT "6 Clicks" Mobility   Outcome Measure  Help needed turning from your back to your side while in a flat bed without using bedrails?: A Lot Help needed moving from lying on your back to sitting on the side of a flat bed without using bedrails?: Total Help needed moving to and from a bed to a chair (including a wheelchair)?: Total Help needed standing up from a chair using your arms (e.g.,  wheelchair or bedside chair)?: Total Help needed to walk in hospital room?: Total Help needed climbing 3-5 steps with a railing? : Total 6 Click Score: 7    End of Session Equipment Utilized During Treatment: Oxygen;Gait belt Activity Tolerance: Patient tolerated treatment well Patient left: in bed;with call bell/phone within reach;with family/visitor present;with restraints reapplied Nurse Communication: Mobility status PT Visit Diagnosis: Unsteadiness on feet (R26.81);Difficulty in walking, not elsewhere classified (R26.2);Ataxic gait (R26.0)     Time: 8119-1478 PT Time Calculation (min) (ACUTE ONLY): 31 min  Charges:  $Therapeutic Activity: 8-22 mins                     [name], PT  Office # [phone] Pager [phone]    Lorriane Shire 12/17/2021, 12:00 PM

## 2021-12-17 NOTE — Progress Notes (Signed)
[facility] Progress Note Patient Name: [name] DOB: 28-Nov-1946 MRN: [id number]   Date of Service  12/17/2021  HPI/Events of Note  Received request for renewal of restraints Patient seen trached and on vent and a risk for self harm by pulling lines and tubes   eICU Interventions  Bilateral soft wrist restraints renewed Bedside team to assess in am if restraints to be continued      Intervention Category Minor Interventions: Agitation / anxiety - evaluation and management  Judd Lien 12/17/2021, 10:41 PM

## 2021-12-17 NOTE — Progress Notes (Addendum)
STROKE TEAM PROGRESS NOTE   INTERVAL HISTORY Patient is seen in his room with his wife at the bedside.  HE has been able to tolerate trach collar for >24 hours and will transfer out of the ICU today.  He is drowsy but agitation haas improved and he is able to follow commands.  Blood pressure adequately controlled.  Vitals:   12/17/21 1000 12/17/21 1100 12/17/21 1125 12/17/21 1200  BP:  (!) 142/69  (!) 142/67  Pulse: (!) 55 (!) 49 (!) 56 (!) 48  Resp: 16 19 (!) 27 (!) 32  Temp:    97.9 F (36.6 C)  TempSrc:    Axillary  SpO2: 99% 100% 100% 100%  Weight:      Height:       CBC:  Recent Labs  Lab 12/15/21 0330 12/16/21 0945  WBC 5.6 5.9  HGB 10.8* 11.7*  HCT 32.7* 35.0*  MCV 91.6 92.3  PLT 273 601    Basic Metabolic Panel:  Recent Labs  Lab 12/14/21 0329 12/15/21 0330 12/16/21 0945  NA 140 142 139  K 3.9 3.8 3.9  CL 110 108 107  CO2 25 26 24   GLUCOSE 109* 107* 139*  BUN 23 19 19   CREATININE 0.76 0.74 0.70  CALCIUM 7.8* 8.1* 8.1*  MG 2.4  --   --   PHOS 2.9  --   --     Lipid Panel:  No results for input(s): CHOL, TRIG, HDL, CHOLHDL, VLDL, LDLCALC in the last 168 hours.   HgbA1c:  No results for input(s): HGBA1C in the last 168 hours.  Urine Drug Screen:  No results for input(s): LABOPIA, COCAINSCRNUR, LABBENZ, AMPHETMU, THCU, LABBARB in the last 168 hours.   Alcohol Level No results for input(s): ETH in the last 168 hours.  IMAGING past 24 hours No results found.  PHYSICAL EXAM  Temp:  [97.9 F (36.6 C)-98.6 F (37 C)] 97.9 F (36.6 C) (01/02 1200) Pulse Rate:  [48-63] 48 (01/02 1200) Resp:  [16-37] 32 (01/02 1200) BP: (95-162)/(65-106) 142/67 (01/02 1200) SpO2:  [95 %-100 %] 100 % (01/02 1200) FiO2 (%):  [28 %] 28 % (01/02 1200)  General - Well nourished, well developed elderly Caucasian male, tracheostomy in place, alert and following commands  Neurological: Drowsy but can be easily aroused.  Follows commands well.  Speech is clear without  dysarthria or aphasia.  PERRL, EOMI, no facial droop present, able to move all four extremities in response to commands with good strength.   ASSESSMENT/PLAN [name] is a 76 y.o. male with history of CABG x4, hyperlipidemia, prostate cancer, internal hemorrhoid, and a nonobstructive calculus in right interpole with hematuria presenting with acute onset vertigo nausea vomiting and an inability to walk.  At 10 AM on November 25, 2021 patient developed vertigo nausea and vomiting.  He also noted abdominal discomfort.  A code stroke was called on his arrival to triage.  Once the patient was in the CT scanner he developed more nausea and vomiting he went to a stat MRI which was positive for bilateral cerebellar strokes.  He then developed ataxia in his bilateral upper and lower extremities.  He was then given TNKase and went for a stat CTA of the head.  No LVO was shown on the CTA. Repeat MRI showed progression of edema related to the cerebellar infarcts with mass effect on the 4th ventricle. Neurosurgery consulted and hypertonic saline initiated. 12/13 suboccipital craniectomy overnight.  Patient tolerated procedure well.  Speech therapy  recommends resuming nectar thickened liquids with his diet.  Repeat CT 12/14 shows hemorrhagic transformation of patient's stroke.  Lovenox resumed as he is post 24 hours from hemorrhagic transformation of his stroke.  Patient's agitation has improved with trial of clonazepam, and his neurological exam has improved.  Palliative care has been consulted to aid in [name] discussions.  Repeat head CT shows no additional bleeding. Repeat MRI shows improved brainstem compression. Abnormal restriction signal at bilateral caudate tails and splenium. Tracheostomy done 12/26. PEG tube done 12/29.  Agitation improved on olanzapine.  Will transfer out of the ICU today.  Stroke:  bilateral cerebellar infarcts R>L with hemorrhagic transformation and brainstem compression, likely due to  newly diagnosed aflutter Code Stroke CT head No acute abnormality. ASPECTS 10.    Repeat CT-extensive swelling in the areas of the acute cerebellar infarction more extensive on the right than the left.  Mass-effect on the fourth ventricle.  No evidence of ventricular obstruction.  Stable lateral and third ventricles. Post craniectomy CT-Unchanged right greater than left cerebellar infarcts.  No new infarction.  Unchanged narrowing of the fourth ventricle.  No evidence of ventricular obstruction or hydrocephalus. Repeat CT 12/14 New hyperdensity in right cerebellar infarct, likely hemorrhage with no increased mass effect. CTA head & neck unremarkable MRI  acute infarct of the cerebellum bilaterally.  negative for hemorrhage.  MRI Repeat 12/12- Interval progression of edema associated to bilateral cerebellar acute infarcts with mass effect on the fourth ventricle and low lying right cerebellar tonsil. No hydrocephalus CT head 12/14 -right cerebellum hemorrhagic transformation without hydrocephalus CT serial repeats - stable postoperative changes involving infarct resolving intraventricular blood.  No acute abnormalities MRI repeat 12/26- stable right cerebellum infarct with hemorrhagic transformation, much improved brainstem compression, fourth ventricle now only minimal compression. abnormal restriction signal at bilateral caudate tails and splenium, concerning for cytotoxic changes 2D Echo EF 70-75% LDL 106 HgbA1c 5.8 VTE prophylaxis - SCDs aspirin 81 mg daily prior to admission, now on aspirin 81, plavix discontinued due to hemorrhagic transformation of cerebellar stroke. Will consider DOAC once CT stable and po access secured Therapy recommendations: Inpatient rehab- [facility] rehab closer to patients home Disposition: Pending  Cerebellar Edema s/p suboccipital decompression  3% normal saline initiated on 12/12 Sodium goal is 150-155 Na 156 S/p suboccipital decompression with Dr. Zada Finders 3%  d/c'd and then resumed due to sodium levels -> now off Close neuro monitoring CT 12/13 reveals unchanged infarcts without evidence of hydrocephalus CT head 12/14 - right cerebellum hemorrhagic transformation without hydrocephalus CT head serial repeats - Stable cerebellar infarct with hemorrhage.  No hydrocephalus or interval change Repeat MRI showed improved brainstem compression, abnormal signal at bilateral caudate tails and splenium  Hypertension -> hypotension Home meds: None fluctuates On clonidine 0.1 Q8  BP goal normotensive PRN hydralazine and levophed  Atrial flutter - new diagnosis Cardiology on board As needed metoprolol for rate control Consider DOAC once CT stable - PEG placed 12/13/2021  Fever Leukocytosis Aspiration pneumonia Respiratory distress presumed aspiration pneumonia intubated 12/04/2021,then extubated Reintubated 12/26 due to respiratory distress, aspiration on secretions, tachypnea T-max 101.9-> afebrile->100.8-> 99-> 98.7 WBC 13.3->13.6->10.0->9.9-> 8.2->6.9-> 7.5->8.1-> 8.3-> 6.7->5.6 CXR 12/13 - Left lower lobe consolidation with small adjacent left pleural effusion. Findings are likely either due to pneumonia or aspiration. CXR 12/26 - Small left-sided pleural effusion with worsening consolidation at the left base. On unasyn -> rocephin Tolerated trach collar >24 hours- transfer to floor  Hyperlipidemia Home meds: Atorvastatin 80 mg, resumed in hospital LDL  106, goal < 70 Atorvastatin 80 mg daily Continue statin at discharge  Delirium/agitation Encephalopathy Precedex stopped due to bradycardia Fentanyl infusing after intubation Klonopin  0.5mg  QHS Olanzapine ODT 5 mg Q8h MRI 12/26 concerning for b/l caudate tails/splenium restriction - cytotoxic changes - may be able to explain pt delirium/agitation  Correct metabolic underlying cause Avoid significant electrolyte changes, avoid neurotoxic medications Repeat MRI in 1-2 months at outpt  follow up Emphasize sleep hygiene Consider psychiatry consult if not improved  Dysphagia NPO for now MBSS done 12/16 NG placed 12/26- TF infusion PEG placed 12/13/2021 Tube feedings infusing  Other Stroke Risk Factors Advanced Age >/= 65  Obesity, Body mass index is 28.41 kg/m., BMI >/= 30 associated with increased.  Use of oral blood pressure medications.  Risk, recommend weight loss, diet and exercise as appropriate  Coronary artery disease s/p CABG  Other Active Problems Abdominal discomfort CTA abdomen pelvis-no evidence of active GI bleed.  Moderate aortic atherosclerosis.  Diverticulosis with no evidence of diverticulitis. Internal hemorrhoid Nonobstructive calculus in right interpole Hematuria one month ago Urecholine 10 mg 3 times daily  Hospital day # 22  Patient seen and examined by NP/APP with MD. MD to update note as needed.   [place] , MSN, AGACNP-BC [facility] See Amion for schedule and pager information 12/17/2021 2:33 PM   STROKE MD NOTE :  I have personally obtained history,examined this patient, reviewed notes, independently viewed imaging studies, participated in medical decision making and plan of care.ROS completed by me personally and pertinent positives fully documented  I have made any additions or clarifications directly to the above note. Agree with note above.  Patient is doing better now and is not as agitated.  Plan to mobilize out of bed and therapy consults and transfer out of ICU to stepdown bed.  We will consult medical hospitalist team to help manage agitation and delirium.  Start Eliquis for stroke prevention given history of atrial flutter and recent stroke resulting more than 2 weeks hemorrhagic transformation.  Discontinue aspirin.  Long discussion patient and wife and answered questions.This patient is critically ill and at significant risk of neurological worsening, death and care requires constant monitoring of vital  signs, hemodynamics,respiratory and cardiac monitoring, extensive review of multiple databases, frequent neurological assessment, discussion with family, other specialists and medical decision making of high complexity.I have made any additions or clarifications directly to the above note.This critical care time does not reflect procedure time, or teaching time or supervisory time of PA/NP/Med Resident etc but could involve care discussion time.  I spent 30 minutes of neurocritical care time  in the care of  this patient.      Antony Contras, MD Medical Director [facility] Pager: [phone] 12/17/2021 3:14 PM   To contact Stroke Continuity provider, please refer to [email or web address] After hours, contact General Neurology

## 2021-12-17 NOTE — Progress Notes (Signed)
Inpatient Rehab Admissions Coordinator:   Continue to follow from a distance as Pt. Awaits response for [facility] in [place], which is his preferred rehab venue.  Clemens Catholic, [place], [place] Admissions Coordinator  [phone] ([place]) [phone] (office)

## 2021-12-17 NOTE — TOC Progression Note (Signed)
Transition of Care [facility]) - Progression Note    Patient Details  Name: [name] MRN: [id number] Date of Birth: November 09, 1946  Transition of Care [facility]) CM/SW Contact  Oren Section Cleta Alberts, RN Phone Number: 12/17/2021, [phone]  Clinical Narrative:    Met with patient and his wife, [name], at bedside; patient has been able to tolerate trach collar for over 24 hours and has plans to transfer out of the ICU today.  Wife pleased with patient progress.  Will attempt to reach admissions coordinator at [facility] rehab today, and send updated clinicals.  Will follow with updates as available.  Addendum: 1500 Left message for [facility] rehab admissions department, requesting call back.  Faxed updated clinical information and therapy notes to [place] at [phone].   Expected Discharge Plan: IP Rehab Facility Barriers to Discharge: Continued Medical Work up  Expected Discharge Plan and Services Expected Discharge Plan: [place]   Discharge Planning Services: CM Consult Post Acute Care Choice: IP Rehab Living arrangements for the past 2 months: Single Family Home                                       Social Determinants of Health (SDOH) Interventions    Readmission Risk Interventions No flowsheet data found.  Reinaldo Raddle, RN, BSN  Trauma/Neuro ICU Case Manager [phone]

## 2021-12-17 NOTE — Evaluation (Signed)
Passy-Muir Speaking Valve - Evaluation Patient Details  Name: [name] MRN: [id number] Date of Birth: 20-May-1946  Today's Date: 12/17/2021 Time: 1712-1735 SLP Time Calculation (min) (ACUTE ONLY): 23 min  Past Medical History:  Past Medical History:  Diagnosis Date   CAD (coronary artery disease)    a. s/p CABG in 2012   Hyperlipidemia    Past Surgical History:  Past Surgical History:  Procedure Laterality Date   CORONARY ARTERY BYPASS GRAFT  2012   ESOPHAGOGASTRODUODENOSCOPY N/A 12/13/2021   Procedure: ESOPHAGOGASTRODUODENOSCOPY (EGD);  Surgeon: Jesusita Oka, MD;  Location: [facility] ENDOSCOPY;  Service: General;  Laterality: N/A;   PEG PLACEMENT N/A 12/13/2021   Procedure: PERCUTANEOUS ENDOSCOPIC GASTROSTOMY (PEG) PLACEMENT;  Surgeon: Jesusita Oka, MD;  Location: [place] ENDOSCOPY;  Service: General;  Laterality: N/A;   SUBOCCIPITAL CRANIECTOMY CERVICAL LAMINECTOMY N/A 11/26/2021   Procedure: SUBOCCIPITAL CRANIECTOMY;  Surgeon: Judith Part, MD;  Location: [place];  Service: Neurosurgery;  Laterality: N/A;   HPI:  76 yo male with stroke risk factors of CAD and HLD. Stat MRI showed bilateral cerebellar infarcts and pt noted to have developed ataxia. Patient was given TNK as he was still in the window. On repeat exam, prior to TNK, his NIHSS was 2 for right sided ataxia. Pt was evaluated by SLP and placed on a regular diet and nectar thick liquids given coughing with thin. Later, pt had a neurochange and underwent a suboccipital craniotomy to relieve ventricle pressure on brain stem with subsequent hemorrhagic conversion of cerebellar stroke. Pt's function has declined with increased signs of aspiration. MBS 12/16: moderate oropharyngeal dyspahgia with base of tongue propulsion particularly impacted, resulting in vallecular  residue. Sensed penetration and aspiration with ejection with nectar thick liquids primarily after the swallow. NPO status with allowance of nectar thick water. ETT  12/20-12/22;12/26 reintub and trach performed. 12/27 trach collar trials; 12/29 PEG    Assessment / Plan / Recommendation  Clinical Impression  Pt participated in [facility] assessment. His sister was at bedside.  D/W RT - secretions have been thin.  Cuff was deflated - pt cleared tracheal secretions.  PMV was placed revealing good access to upper airway despite large trach (#8).  VS were stable at 100% Sp02, 57 HR, 16 RR and remained there for duration of valve use. Pt's voice was wet but intelligible - he responded to questions about his family/career with good accuracy per his sister, confusion persists.  Overall with good success with valve. Cuff was left deflated and PMV warning sticker placed on balloon line. Recommend pt use valve with full staff supervision, wear with therapies; SLP will follow.  D/W pt's sister and his wife, who was on the phone and able to exchange conversation with [place]. SLP Visit Diagnosis: Aphonia (R49.1)    SLP Assessment  Patient needs continued Speech [place] Pathology Services    Recommendations for follow up therapy are one component of a multi-disciplinary discharge planning process, led by the attending physician.  Recommendations may be updated based on patient status, additional functional criteria and insurance authorization.  Follow Up Recommendations  Acute inpatient rehab (3hours/day)    Assistance Recommended at Discharge Frequent or constant Supervision/Assistance  Functional Status Assessment Patient has had a recent decline in their functional status and demonstrates the ability to make significant improvements in function in a reasonable and predictable amount of time.  Frequency and Duration min 2x/week  2 weeks    PMSV Trial PMSV was placed for: 18 Able to redirect subglottic air  through upper airway: Yes Able to Attain Phonation: Yes Voice Quality: Wet Able to Expectorate Secretions: No attempts Breath Support for Phonation: Mildly  decreased Intelligibility: Intelligibility reduced Word: 75-100% accurate Phrase: 75-100% accurate Sentence: 75-100% accurate Respirations During Trial: 16 SpO2 During Trial: 100 % Pulse During Trial: 57   Tracheostomy Tube  Additional Tracheostomy Tube Assessment Fenestrated: No    Vent Dependency  FiO2 (%): 28 %    Cuff Deflation Trial Tolerated Cuff Deflation: Yes Length of Time for Cuff Deflation Trial: 18  Tyler Lavergne L. Tivis Ringer, [place] Office number [phone] Pager [phone]        [name] 12/17/2021, 5:45 PM

## 2021-12-18 ENCOUNTER — Inpatient Hospital Stay (HOSPITAL_COMMUNITY): Payer: Medicare Other

## 2021-12-18 DIAGNOSIS — I639 Cerebral infarction, unspecified: Secondary | ICD-10-CM | POA: Diagnosis not present

## 2021-12-18 LAB — BASIC METABOLIC PANEL
Anion gap: 9 (ref 5–15)
BUN: 18 mg/dL (ref 8–23)
CO2: 26 mmol/L (ref 22–32)
Calcium: 8.8 mg/dL — ABNORMAL LOW (ref 8.9–10.3)
Chloride: 100 mmol/L (ref 98–111)
Creatinine, Ser: 0.66 mg/dL (ref 0.61–1.24)
GFR, Estimated: >60 mL/min (ref >60)
Glucose, Bld: 138 mg/dL — ABNORMAL HIGH (ref 70–99)
Potassium: 4.1 mmol/L (ref 3.5–5.1)
Sodium: 135 mmol/L (ref 135–145)

## 2021-12-18 LAB — CBC WITH DIFFERENTIAL/PLATELET
Abs Immature Granulocytes: 0.04 10*3/uL (ref 0.00–0.07)
Basophils Absolute: 0 10*3/uL (ref 0.0–0.1)
Basophils Relative: 0 %
Eosinophils Absolute: 0.1 10*3/uL (ref 0.0–0.5)
Eosinophils Relative: 1 %
HCT: 40.5 % (ref 39.0–52.0)
Hemoglobin: 13.8 g/dL (ref 13.0–17.0)
Immature Granulocytes: 0 %
Lymphocytes Relative: 9 %
Lymphs Abs: 0.8 10*3/uL (ref 0.7–4.0)
MCH: 30.7 pg (ref 26.0–34.0)
MCHC: 34.1 g/dL (ref 30.0–36.0)
MCV: 90.2 fL (ref 80.0–100.0)
Monocytes Absolute: 0.5 10*3/uL (ref 0.1–1.0)
Monocytes Relative: 6 %
Neutro Abs: 7.7 10*3/uL (ref 1.7–7.7)
Neutrophils Relative %: 84 %
Platelets: 306 10*3/uL (ref 150–400)
RBC: 4.49 MIL/uL (ref 4.22–5.81)
RDW: 13.8 % (ref 11.5–15.5)
WBC: 9.2 10*3/uL (ref 4.0–10.5)
nRBC: 0 % (ref 0.0–0.2)

## 2021-12-18 LAB — MAGNESIUM: Magnesium: 2.2 mg/dL (ref 1.7–2.4)

## 2021-12-18 LAB — GLUCOSE, CAPILLARY
Glucose-Capillary: 109 mg/dL — ABNORMAL HIGH (ref 70–99)
Glucose-Capillary: 122 mg/dL — ABNORMAL HIGH (ref 70–99)
Glucose-Capillary: 129 mg/dL — ABNORMAL HIGH (ref 70–99)
Glucose-Capillary: 138 mg/dL — ABNORMAL HIGH (ref 70–99)
Glucose-Capillary: 154 mg/dL — ABNORMAL HIGH (ref 70–99)
Glucose-Capillary: 157 mg/dL — ABNORMAL HIGH (ref 70–99)

## 2021-12-18 LAB — PROCALCITONIN: Procalcitonin: <0.1 ng/mL

## 2021-12-18 IMAGING — DX DG CHEST 1V PORT
1 series · 1 of 1 positions shown · non-contrast
Comparison: [DATE]

CLINICAL DATA: Aspiration pneumonia , trach present

EXAM:
PORTABLE CHEST - 1 VIEW

[chest ap]
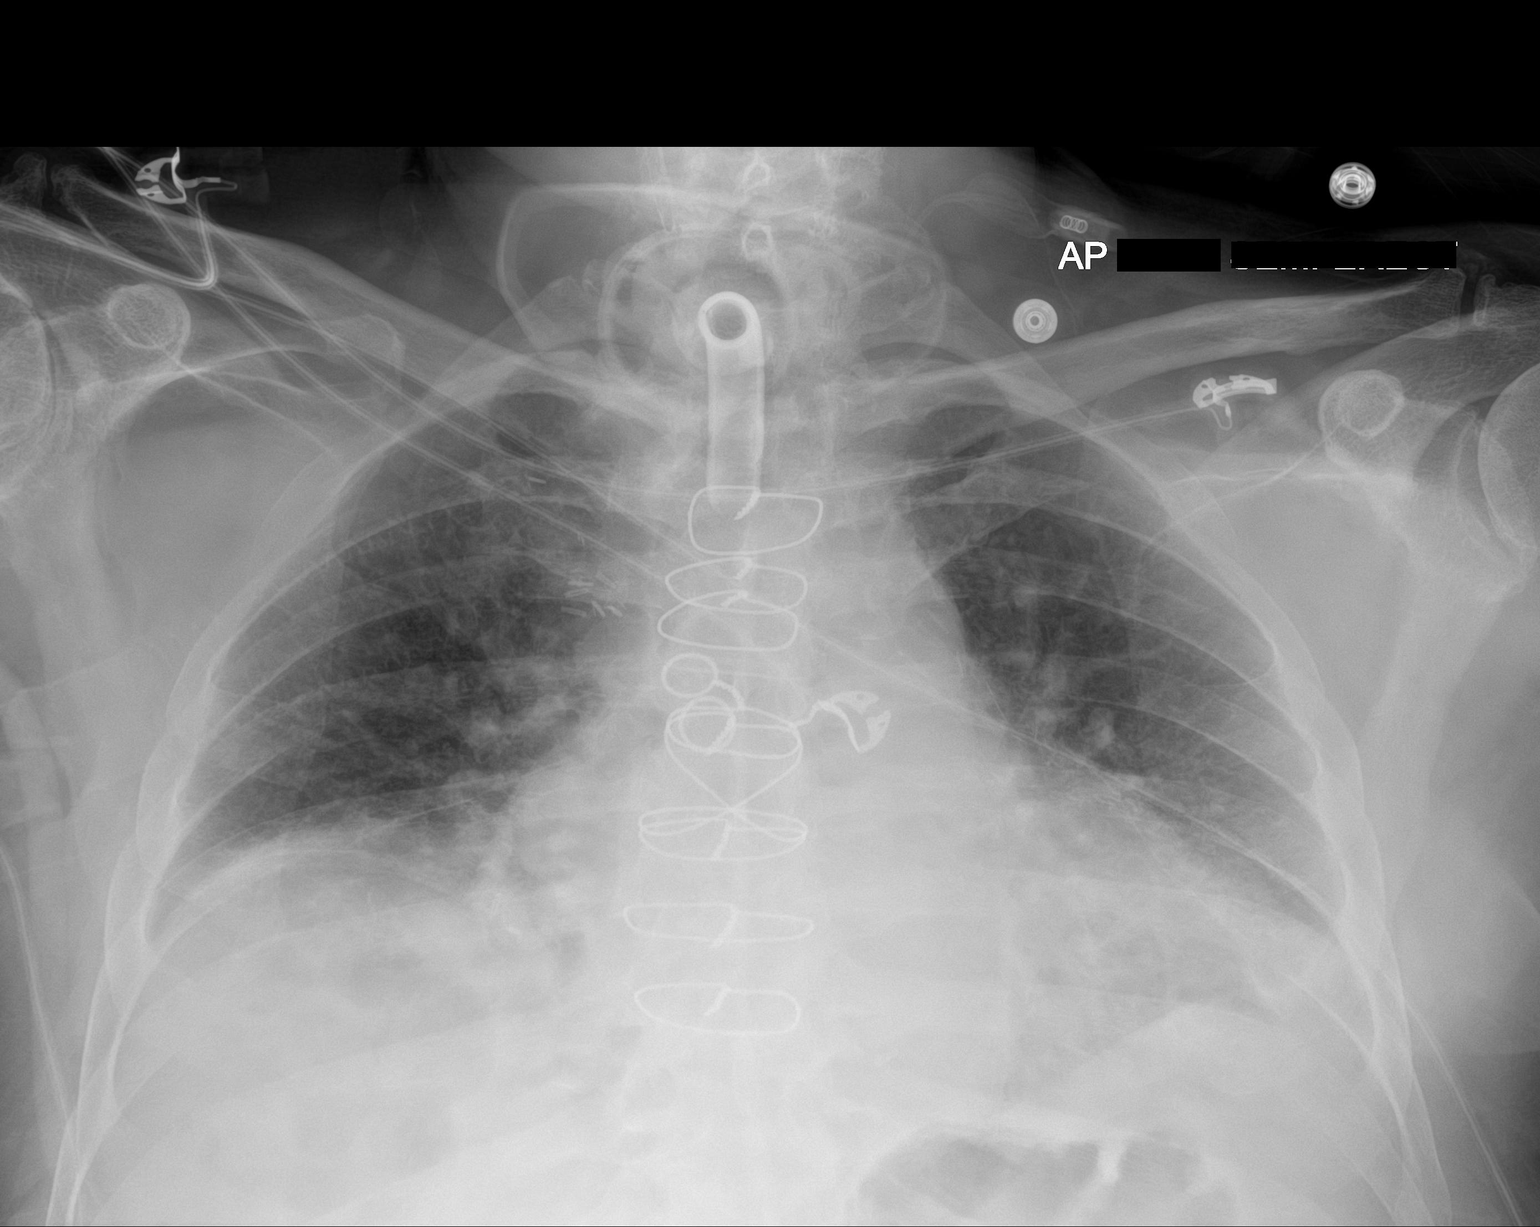

[1 of 1 positions shown; findings below may reference images not displayed]

FINDINGS: Low lung volumes. Increasing infiltrate or atelectasis at the right
lung base. Partial improvement in the left retrocardiac
consolidation/atelectasis seen previously.

Heart size and mediastinal contours are within normal limits. CABG
markers.

Blunting of the left lateral costophrenic angle as before. No
pneumothorax.

Sternotomy wires. Stable tracheostomy device. Gastric tube has been
removed.
IMPRESSION: 1. Low volumes with worsening atelectasis/infiltrate at the right
lung base, improved aeration on the left.

## 2021-12-18 MED ORDER — METOPROLOL TARTRATE 25 MG PO TABS
25.0000 mg | ORAL_TABLET | Freq: Two times a day (BID) | ORAL | Status: DC
  Administered 2021-12-18 – 2021-12-22 (×10): 25 mg
  Filled 2021-12-18 (×10): qty 1

## 2021-12-18 MED ORDER — METOPROLOL TARTRATE 5 MG/5ML IV SOLN
5.0000 mg | Freq: Once | INTRAVENOUS | Status: AC
  Administered 2021-12-18: 5 mg via INTRAVENOUS
  Filled 2021-12-18: qty 5

## 2021-12-18 NOTE — Progress Notes (Signed)
This chaplain is present for F/U spiritual care and confirmation of housing assistance at [facility].    The chaplain phoned the Pt. wife-[name] and the Pt. son-Rob is at the bedside.  The address was confirmed and door code was shared with [name] and Rob. The agreement is for a week, beginning today. The chaplain educated [name] on the process of reapplying if housing is needed beyond a week.   This chaplain is available for F/U spiritual care as needed.  Chaplain Sallyanne Kuster [phone]

## 2021-12-18 NOTE — Progress Notes (Signed)
STROKE TEAM PROGRESS NOTE   INTERVAL HISTORY Patient is seen in his room with his son at the bedside.  He is doing well and has been able to tolerate trach collar and will transfer out of the ICU today.  He is alert and agitation haas improved and he is able to follow commands.  Blood pressure adequately controlled.  He still has significant tracheostomy secretions requiring frequent suctioning.  BMP and CBC are unremarkable.  Procalcitonin was normal  Vitals:   12/18/21 1500 12/18/21 1521 12/18/21 1540 12/18/21 1600  BP: 135/83   95/79  Pulse: 97 (!) 102  93  Resp: (!) 29 (!) 27  (!) 27  Temp:   (!) 97.4 F (36.3 C)   TempSrc:   Axillary   SpO2: 100% 98%  97%  Weight:      Height:       CBC:  Recent Labs  Lab 12/16/21 0945 12/18/21 1358  WBC 5.9 9.2  NEUTROABS  --  7.7  HGB 11.7* 13.8  HCT 35.0* 40.5  MCV 92.3 90.2  PLT 259 038   Basic Metabolic Panel:  Recent Labs  Lab 12/14/21 0329 12/15/21 0330 12/16/21 0945 12/18/21 1358  NA 140   < > 139 135  K 3.9   < > 3.9 4.1  CL 110   < > 107 100  CO2 25   < > 24 26  GLUCOSE 109*   < > 139* 138*  BUN 23   < > 19 18  CREATININE 0.76   < > 0.70 0.66  CALCIUM 7.8*   < > 8.1* 8.8*  MG 2.4  --   --  2.2  PHOS 2.9  --   --   --    < > = values in this interval not displayed.   Lipid Panel:  No results for input(s): CHOL, TRIG, HDL, CHOLHDL, VLDL, LDLCALC in the last 168 hours.   HgbA1c:  No results for input(s): HGBA1C in the last 168 hours.  Urine Drug Screen:  No results for input(s): LABOPIA, COCAINSCRNUR, LABBENZ, AMPHETMU, THCU, LABBARB in the last 168 hours.   Alcohol Level No results for input(s): ETH in the last 168 hours.  IMAGING past 24 hours DG CHEST PORT 1 VIEW  Result Date: 12/18/2021 CLINICAL DATA:  Aspiration pneumonia , trach present EXAM: PORTABLE CHEST - 1 VIEW COMPARISON:  12/10/2021 FINDINGS: Low lung volumes. Increasing infiltrate or atelectasis at the right lung base. Partial improvement in the  left retrocardiac consolidation/atelectasis seen previously. Heart size and mediastinal contours are within normal limits. CABG markers. Blunting of the left lateral costophrenic angle as before. No pneumothorax. Sternotomy wires. Stable tracheostomy device. Gastric tube has been removed. IMPRESSION: 1. Low volumes with worsening atelectasis/infiltrate at the right lung base, improved aeration on the left. Electronically Signed   By: [name] M.D.   On: 12/18/2021 11:59    PHYSICAL EXAM  Temp:  [97.4 F (36.3 C)-99.3 F (37.4 C)] 97.4 F (36.3 C) (01/03 1540) Pulse Rate:  [52-115] 93 (01/03 1600) Resp:  [16-32] 27 (01/03 1600) BP: (95-187)/(57-132) 95/79 (01/03 1600) SpO2:  [96 %-100 %] 97 % (01/03 1600) FiO2 (%):  [28 %] 28 % (01/03 1521)  General - Well nourished, well developed elderly Caucasian male, tracheostomy in place, alert and following commands  Neurological: Awake and alert.  Follows commands well.  Cannot be tested due to tracheostomy but was able to speak earlier with speech therapist with valid.  PERRL, EOMI, no  facial droop present, able to move all four extremities in response to commands with good strength.   ASSESSMENT/PLAN [name] is a 76 y.o. male with history of CABG x4, hyperlipidemia, prostate cancer, internal hemorrhoid, and a nonobstructive calculus in right interpole with hematuria presenting with acute onset vertigo nausea vomiting and an inability to walk.  At 10 AM on November 25, 2021 patient developed vertigo nausea and vomiting.  He also noted abdominal discomfort.  A code stroke was called on his arrival to triage.  Once the patient was in the CT scanner he developed more nausea and vomiting he went to a stat MRI which was positive for bilateral cerebellar strokes.  He then developed ataxia in his bilateral upper and lower extremities.  He was then given TNKase and went for a stat CTA of the head.  No LVO was shown on the CTA. Repeat MRI showed  progression of edema related to the cerebellar infarcts with mass effect on the 4th ventricle. Neurosurgery consulted and hypertonic saline initiated. 12/13 suboccipital craniectomy overnight.  Patient tolerated procedure well.  Speech therapy recommends resuming nectar thickened liquids with his diet.  Repeat CT 12/14 shows hemorrhagic transformation of patient's stroke.  Lovenox resumed as he is post 24 hours from hemorrhagic transformation of his stroke.  Patient's agitation has improved with trial of clonazepam, and his neurological exam has improved.  Palliative care has been consulted to aid in [place] discussions.  Repeat head CT shows no additional bleeding. Repeat MRI shows improved brainstem compression. Abnormal restriction signal at bilateral caudate tails and splenium. Tracheostomy done 12/26. PEG tube done 12/29.  Agitation improved on olanzapine.  Will transfer out of the ICU today.  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 ASPECTS 10.    Repeat CT-extensive swelling in the areas of the acute cerebellar infarction more extensive on the right than the left.  Mass-effect on the fourth ventricle.  No evidence of ventricular obstruction.  Stable lateral and third ventricles. Post craniectomy CT-Unchanged right greater than left cerebellar infarcts.  No new infarction.  Unchanged narrowing of the fourth ventricle.  No evidence of ventricular obstruction or hydrocephalus. Repeat CT 12/14 New hyperdensity in right cerebellar infarct, likely hemorrhage with no increased mass effect. CTA head & neck unremarkable MRI  acute infarct of the cerebellum bilaterally.  negative for hemorrhage.  MRI Repeat 12/12- Interval progression of edema associated to bilateral cerebellar acute infarcts with mass effect on the fourth ventricle and low lying right cerebellar tonsil. No  hydrocephalus CT head 12/14 -right cerebellum hemorrhagic transformation without hydrocephalus CT serial repeats - stable postoperative changes involving infarct resolving intraventricular blood.  No acute abnormalities MRI repeat 12/26- stable right cerebellum infarct with hemorrhagic transformation, much improved brainstem compression, fourth ventricle now only minimal compression. abnormal restriction signal at bilateral caudate tails and splenium, concerning for cytotoxic changes 2D Echo EF 70-75% LDL 106 HgbA1c 5.8 VTE prophylaxis - SCDs aspirin 81 mg daily prior to admission, was initially placed on aspirin 81, plavix discontinued due to hemorrhagic transformation of cerebellar stroke.  Switched to Eliquis 12/17/2021  therapy recommendations: Inpatient rehab- [facility] rehab closer to patients home Disposition: Pending  Cerebellar Edema s/p suboccipital decompression  3% normal saline initiated on 12/12 Sodium goal is 150-155 Na 156 S/p suboccipital decompression with Dr. Zada Finders 3% d/c'd and then resumed due to sodium levels -> now off Close neuro monitoring  CT 12/13 reveals unchanged infarcts without evidence of hydrocephalus CT head 12/14 - right cerebellum hemorrhagic transformation without hydrocephalus CT head serial repeats - Stable cerebellar infarct with hemorrhage.  No hydrocephalus or interval change Repeat MRI showed improved brainstem compression, abnormal signal at bilateral caudate tails and splenium  Hypertension -> hypotension Home meds: None fluctuates On clonidine 0.1 Q8  BP goal normotensive PRN hydralazine and levophed  Atrial flutter - new diagnosis Cardiology on board As needed metoprolol for rate control Consider DOAC once CT stable - PEG placed 12/13/2021  Fever Leukocytosis Aspiration pneumonia Respiratory distress presumed aspiration pneumonia intubated 12/04/2021,then extubated Reintubated 12/26 due to respiratory distress, aspiration on  secretions, tachypnea T-max 101.9-> afebrile->100.8-> 99-> 98.7 WBC 13.3->13.6->10.0->9.9-> 8.2->6.9-> 7.5->8.1-> 8.3-> 6.7->5.6 CXR 12/13 - Left lower lobe consolidation with small adjacent left pleural effusion. Findings are likely either due to pneumonia or aspiration. CXR 12/26 - Small left-sided pleural effusion with worsening consolidation at the left base. On unasyn -> rocephin Tolerated trach collar >24 hours- transfer to floor  Hyperlipidemia Home meds: Atorvastatin 80 mg, resumed in hospital LDL 106, goal < 70 Atorvastatin 80 mg daily Continue statin at discharge  Delirium/agitation Encephalopathy Precedex stopped due to bradycardia Fentanyl infusing after intubation Klonopin  0.5mg  QHS Olanzapine ODT 5 mg Q8h MRI 12/26 concerning for b/l caudate tails/splenium restriction - cytotoxic changes - may be able to explain pt delirium/agitation  Correct metabolic underlying cause Avoid significant electrolyte changes, avoid neurotoxic medications Repeat MRI in 1-2 months at outpt follow up Emphasize sleep hygiene Consider psychiatry consult if not improved  Dysphagia NPO for now MBSS done 12/16 NG placed 12/26- TF infusion PEG placed 12/13/2021 Tube feedings infusing  Other Stroke Risk Factors Advanced Age >/= 65  Obesity, Body mass index is 28.41 kg/m., BMI >/= 30 associated with increased.  Use of oral blood pressure medications.  Risk, recommend weight loss, diet and exercise as appropriate  Coronary artery disease s/p CABG  Other Active Problems Abdominal discomfort CTA abdomen pelvis-no evidence of active GI bleed.  Moderate aortic atherosclerosis.  Diverticulosis with no evidence of diverticulitis. Internal hemorrhoid Nonobstructive calculus in right interpole Hematuria one month ago Urecholine 10 mg 3 times daily  Hospital day # 23   Patient is doing better now and is not as agitated.  Plan to continue to mobilize out of bed and therapy consults and  transfer out of ICU to stepdown bed.  Appreciate medical hospitalist team to take over as primary and to help manage agitation and delirium.  Started Eliquis for stroke prevention given history of atrial flutter and recent stroke resulting more than 2 weeks hemorrhagic transformation.  Discontinued aspirin.  Long discussion patient and son and answered questions.discussed with Dr. Doristine Bosworth.This patient is critically ill and at significant risk of neurological worsening, death and care requires constant monitoring of vital signs, hemodynamics,respiratory and cardiac monitoring, extensive review of multiple databases, frequent neurological assessment, discussion with family, other specialists and medical decision making of high complexity.I have made any additions or clarifications directly to the above note.This critical care time does not reflect procedure time, or teaching time or supervisory time of PA/NP/Med Resident etc but could involve care discussion time.  I spent 30 minutes of neurocritical care time  in the care of  this patient.      Antony Contras, MD Medical Director [facility] Pager: [phone] 12/18/2021 4:15 PM   To contact Stroke Continuity provider, please refer to [email or web address] After hours, contact General Neurology

## 2021-12-18 NOTE — Progress Notes (Signed)
Occupational Therapy Treatment Patient Details Name: [name] MRN: [id number] DOB: [birth year] Today's Date: 12/18/2021   History of present illness 76 yo male presenting to ED on 12/11 with ataxia and vertigo. 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 TNK given at 1214. S/p suboccipital crani for evacuation on 12/13. Required intubation 12/20-12/22. 12/26 trach 12/27 trach trials 12/29 Peg PMH including CAD s/p CABG x4, HLD, OA, nephrolithiasis, urinary retention, BPH with LUTS and prostate cancer.   OT comments  Pt very internally distracted this session and resistant to basic transfers in stedy. Pt sustained eob sitting with mod to max (A). Pt decreased visual attention during session and occluding eyes. Son [name] present throughout session. Recommendation CIR at this time.     Recommendations for follow up therapy are one component of a multi-disciplinary discharge planning process, led by the attending physician.  Recommendations may be updated based on patient status, additional functional criteria and insurance authorization.    Follow Up Recommendations  Acute inpatient rehab (3hours/day)    Assistance Recommended at Discharge Frequent or constant Supervision/Assistance  Patient can return home with the following  Two people to help with bathing/dressing/bathroom;Other (comment) (bed level care currently due to poor demo at transfers)   Equipment Recommendations  Wheelchair (measurements OT);Wheelchair cushion (measurements OT);Hospital bed;BSC/3in1    Recommendations for Other Services PT consult;Rehab consult;Speech consult    Precautions / Restrictions Precautions Precautions: Fall Precaution Comments: trach, peg, foley Restrictions Weight Bearing Restrictions: No Other Position/Activity Restrictions: Wears a lift in R shoe s/p 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 pt very restless at EOB. pt resisting any attmepts at transfer. pt returning to supine with decreased resistance    Transfers Overall transfer level: Needs assistance Equipment used:  (stedy) Transfers: Sit to/from Stand Sit to Stand: +2 physical assistance;Max assist;From elevated surface;+2 safety/equipment           General transfer comment: elevates surface and unable to clear bed surface on multiple attempts. pt pushing and pulling at bed sheets     Balance Overall balance assessment: Needs assistance Sitting-balance support: Bilateral upper extremity supported;Feet supported Sitting balance-Leahy Scale: Zero                                     ADL either performed or assessed with clinical judgement   ADL Overall ADL's : Needs assistance/impaired Eating/Feeding: NPO                                     General ADL Comments: session focus on transfer attempts    Extremity/Trunk Assessment              Vision   Additional Comments: closing eyes during session and needs redirection to visually attend   Perception     Praxis      Cognition Arousal/Alertness: Awake/alert Behavior During Therapy: Restless;Flat affect;Impulsive Overall Cognitive Status: Difficult to assess  Area of Impairment: Following commands                   Current Attention Level: Focused   Following Commands: Follows one step commands inconsistently;Follows one step commands with increased time Safety/Judgement: Decreased awareness of  safety;Decreased awareness of deficits Awareness: Intellectual Problem Solving: Slow processing General Comments: pt attempting to pull at sheets and restless at eob. pt with PMV placed without any clear needs expressed. pt lookign at son on command and when son speaks but not addressing him          Exercises Exercises: Other exercises Other Exercises Other Exercises: poor simple command following and visual attention this session Other Exercises: pt very interally distracted and pulling at all lines/ leads when possible   Shoulder Instructions       General Comments trach collar. RN noted Afib with RVR prior to session and medicated pt. pt HR 103 afib during session    Pertinent Vitals/ Pain       Pain Assessment: Faces Faces Pain Scale: Hurts a little bit Facial Expression: Grimacing Body Movements: Restlessness Muscle Tension: Very tense or rigid Compliance with ventilator (intubated pts.): N/A Vocalization (extubated pts.): Sighing, moaning CPOT Total: 7 Pain Location: generalized Pain Descriptors / Indicators: Restless Pain Intervention(s): Monitored during session;Repositioned  Home Living                                          Prior Functioning/Environment              Frequency  Min 2X/week        Progress Toward Goals  OT Goals(current goals can now be found in the care plan section)  Progress towards OT goals: Progressing toward goals  Acute Rehab OT Goals Patient Stated Goal: son present and in town until further notice to help with whatever care is required OT Goal Formulation: Patient unable to participate in goal setting Time For Goal Achievement: 12/26/21 Potential to Achieve Goals: Good ADL Goals Pt Will Perform Grooming: with set-up;with supervision;sitting Pt Will Perform Lower Body Dressing: with min assist;sit to/from stand Pt Will Transfer to Toilet: with min assist;stand pivot transfer;bedside commode Pt Will  Perform Toileting - Clothing Manipulation and hygiene: with min guard assist;sitting/lateral leans Additional ADL Goal #1: Pt will complete bed mobility mod (A) as precursor to adls.  Plan Discharge plan remains appropriate    Co-evaluation    PT/OT/SLP Co-Evaluation/Treatment: Yes Reason for Co-Treatment: Complexity of the patient's impairments (multi-system involvement);Necessary to address cognition/behavior during functional activity;For patient/therapist safety;To address functional/ADL transfers   OT goals addressed during session: Strengthening/ROM;ADL's and self-care      AM-PAC OT "6 Clicks" Daily Activity     Outcome Measure   Help from another person eating meals?: A Lot Help from another person taking care of personal grooming?: A Lot Help from another person toileting, which includes using toliet, bedpan, or urinal?: A Lot Help from another person bathing (including washing, rinsing, drying)?: A Lot Help from another person to put on and taking off regular upper body clothing?: A Lot Help from another person to put on and taking off regular lower body clothing?: Total 6 Click Score: 11    End of Session Equipment Utilized During Treatment: Oxygen  OT Visit Diagnosis: Unsteadiness on feet (R26.81);Other abnormalities of gait and mobility (R26.89);Muscle weakness (generalized) (M62.81);History of falling (Z91.81)  Activity Tolerance Patient tolerated treatment well   Patient Left in bed;with call bell/phone within reach;with bed alarm set   Nurse Communication Mobility status;Precautions        Time: 1146-4314 OT Time Calculation (min): 24 min  Charges: OT General Charges $OT Visit: 1 Visit OT Treatments $Self Care/Home Management : 8-22 mins   Brynn, OTR/L  Acute Rehabilitation Services Pager: [phone] Office: [phone] .   Jeri Modena 12/18/2021, 2:14 PM

## 2021-12-18 NOTE — Progress Notes (Addendum)
PROGRESS NOTE    [name]  FIE:[id number] DOB: 06-16-1946 DOA: 11/25/2021 PCP: Rogelia Rohrer, MD   Brief Narrative:  76 year old man who presented to [facility] 12/11 with cerebellar stroke. Received TNKase; he developed increasing mass effect on the 4th ventricle and was transferred to the ICU. Suboccipital craniectomy completed 12/13.  Repeat CT 12/14 demonstrated hemorrhagic conversion.   Significant Hospital Events: Including procedures, antibiotic start and stop dates in addition to other pertinent events   12/11 admission, received TNKase 12/13 worsening edema/mass effect, had emergent craniotomy 12/14 hemorrhagic transformation of stroke 12/20 Finished unasyn for aspiration PNA, re intubated 12/22 Extubated 12/23 Palliative consulted 12/26 Re intubated. Tracheostomy performed, Finished ceftriaxone MRI> no new acute ischemia, improvement in brainstem compression, and abnormal signal at bilateral caudate tails and splenium  12/27 trach collar trials 12/28 off vent overnight  12/29 PEG, hypotensive post procedure, back on vent, CT head with no acute hemmorhage 1/1 Back to trach collar 1/3 Tolerating ATC, remains slightly delirious but able to follow commands/participate in care.  Transferred under [place]  Assessment & Plan:   Principal Problem:   Cerebellar stroke [facility]) Active Problems:   Atrial flutter (HCC)   Acute hypercapnic respiratory failure (HCC)   Massive aspiration syndrome   Acute ischemic stroke (HCC)   Acute respiratory failure with hypoxia (HCC)   Endotracheal tube present  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 and compression of ventral pons s/p posterior decompression with craniectomy Hemorrhagic conversion of R cerebellar infarct 12/30 CT Head with no acute hemorrhage - Stroke team following and managing. - ASA discontinued and Eliquis started on 12/17/2021 due to atrial fibrillation. - Repeat MRI in 1-2 months per Neuro, will need outpatient  f/u - Rehab bed pending [facility] closest to patient's home)   Acute hypoxic respiratory failure, s/p tracheostomy Possible aspiration pneumonia Right lower lobe pneumonia S/p 7 days Unasyn and 7 days of ceftriaxone, Off vent since 10 AM 12/28. Resumed s/p PEG.  - Continues to tolerate ATC for the last > 48H - Pulmonary hygiene - Requiring intermittent NTS via trach, though cough is stronger/secretions improving - DuoNebs PRN - Intermittent CXR to ensure resolution of PNA.  Due to tachypnea, I ordered repeat chest x-ray which shows worsening atelectasis and infiltrates.  Checking CBC and procalcitonin to see if he needs another course of antibiotics. Management per PCCM.   Dysphagia Modified barium swallow showing aspiration, now s/p PED placed 12/29 - Continues to work with SLP - PMV as able   CAD with chronic HFpEF: Stable.  Hypertension: Fairly controlled with some elevation.  Currently on amlodipine 10 mg, clonidine 0.1 mg 3 times daily and losartan 50 mg.  I have added Lopressor 25 mg twice daily.  New onset atrial fibrillation: Started on Eliquis 12/17/2021.  Was having A. fib with RVR this morning when I saw him with rates around 115.  1 dose of IV Lopressor 5 mg ordered and Lopressor 25 mg via tube twice daily ordered.  HLD: Statin.   Hyperactive delirium, improving - Continue Zyprexa   BPH - Continue urecholine - Trend UOP.  DVT prophylaxis: Place and maintain sequential compression device Start: 12/05/21 0838 SCD's Start: 11/25/21 1246   Code Status: Full Code  Family Communication: Patient's son present at bedside.  Plan of care discussed with him in length and he verbalized understanding and agreed with it.  Status is: Inpatient  Remains inpatient appropriate because: Still sick and waiting for inpatient rehab  Estimated body mass index is  28.41 kg/m as calculated from the following:   Height as of this encounter: 5\' 10"  (1.778 m).   Weight as of  this encounter: 89.8 kg.     Nutritional Assessment: Body mass index is 28.41 kg/m.Marland Kitchen Seen by dietician.  I agree with the assessment and plan as outlined below: Nutrition Status: Nutrition Problem: Inadequate oral intake Etiology: inability to eat Signs/Symptoms: NPO status Interventions: Tube feeding, Prostat  .  Skin Assessment: I have examined the patient's skin and I agree with the wound assessment as performed by the wound care RN as outlined below:    Consultants:  Neurology PCCM Neurosurgery Cardiology Palliative care  Procedures:  As above  Antimicrobials:  Anti-infectives (From admission, onward)    Start     Dose/Rate Route Frequency Ordered Stop   12/06/21 0000  vancomycin (VANCOREADY) IVPB 1500 mg/300 mL  Status:  Discontinued        1,500 mg 150 mL/hr over 120 Minutes Intravenous Every 24 hours 12/05/21 1105 12/06/21 0711   12/05/21 0000  vancomycin (VANCOREADY) IVPB 1250 mg/250 mL  Status:  Discontinued        1,250 mg 166.7 mL/hr over 90 Minutes Intravenous Every 24 hours 12/04/21 2304 12/05/21 1106   12/04/21 2345  cefTRIAXone (ROCEPHIN) 2 g in sodium chloride 0.9 % 100 mL IVPB        2 g 200 mL/hr over 30 Minutes Intravenous Every 24 hours 12/04/21 2258 12/10/21 2308   11/30/21 0600  vancomycin (VANCOREADY) IVPB 1250 mg/250 mL  Status:  Discontinued        1,250 mg 166.7 mL/hr over 90 Minutes Intravenous Every 24 hours 11/29/21 0425 11/30/21 0909   11/29/21 0515  vancomycin (VANCOREADY) IVPB 750 mg/150 mL  Status:  Discontinued        750 mg 150 mL/hr over 60 Minutes Intravenous Every 8 hours 11/29/21 0415 11/29/21 0420   11/29/21 0515  vancomycin (VANCOREADY) IVPB 2000 mg/400 mL        2,000 mg 200 mL/hr over 120 Minutes Intravenous  Once 11/29/21 0425 11/29/21 0656   11/27/21 0930  Ampicillin-Sulbactam (UNASYN) 3 g in sodium chloride 0.9 % 100 mL IVPB        3 g 200 mL/hr over 30 Minutes Intravenous Every 6 hours 11/27/21 0844 12/03/21 2228           Subjective: Patient seen and examined.  Son at the bedside.  Patient slightly agitated and tachypneic.  Did not appear to be coherent.  Objective: Vitals:   12/18/21 0838 12/18/21 0900 12/18/21 1000 12/18/21 1200  BP: (!) 156/64 (!) 165/65 (!) 160/88 (!) 143/66  Pulse: 81 78 83 (!) 115  Resp: (!) 28 (!) 29 (!) 30 (!) 32  Temp:    98.8 F (37.1 C)  TempSrc:    Axillary  SpO2: 98% 99% 100% 98%  Weight:      Height:        Intake/Output Summary (Last 24 hours) at 12/18/2021 1323 Last data filed at 12/18/2021 1200 Gross per 24 hour  Intake 2265 ml  Output 2000 ml  Net 265 ml   Filed Weights   12/14/21 0500 12/15/21 0500 12/16/21 0500  Weight: 89.8 kg 89.8 kg 89.8 kg    Examination:  General exam: Appears slightly agitated. Respiratory system: Rhonchi bilaterally.  Tachypneic. Cardiovascular system: S1 & S2 heard, irregularly irregular rate and rhythm. No JVD, murmurs, rubs, gallops or clicks. No pedal edema. Gastrointestinal system: Abdomen is nondistended, soft and nontender. No organomegaly  or masses felt. Normal bowel sounds heard. Central nervous system: Alert but perhaps not oriented.   Data Reviewed: I have personally reviewed following labs and imaging studies  CBC: Recent Labs  Lab 12/12/21 0337 12/13/21 0417 12/14/21 0329 12/15/21 0330 12/16/21 0945  WBC 8.1 8.3 6.7 5.6 5.9  HGB 11.2* 12.1* 11.1* 10.8* 11.7*  HCT 34.8* 36.2* 33.0* 32.7* 35.0*  MCV 93.0 92.6 91.4 91.6 92.3  PLT 271 298 300 273 751   Basic Metabolic Panel: Recent Labs  Lab 12/12/21 0337 12/13/21 0417 12/14/21 0329 12/15/21 0330 12/16/21 0945  NA 142 140 140 142 139  K 3.2* 3.5 3.9 3.8 3.9  CL 109 108 110 108 107  CO2 26 22 25 26 24   GLUCOSE 144* 116* 109* 107* 139*  BUN 16 17 23 19 19   CREATININE 0.73 0.63 0.76 0.74 0.70  CALCIUM 7.7* 7.9* 7.8* 8.1* 8.1*  MG  --   --  2.4  --   --   PHOS  --   --  2.9  --   --    GFR: Estimated Creatinine Clearance: 91.3 mL/min  (by C-G formula based on SCr of 0.7 mg/dL). Liver Function Tests: No results for input(s): AST, ALT, ALKPHOS, BILITOT, PROT, ALBUMIN in the last 168 hours. No results for input(s): LIPASE, AMYLASE in the last 168 hours. No results for input(s): AMMONIA in the last 168 hours. Coagulation Profile: No results for input(s): INR, PROTIME in the last 168 hours. Cardiac Enzymes: No results for input(s): CKTOTAL, CKMB, CKMBINDEX, TROPONINI in the last 168 hours. BNP (last 3 results) No results for input(s): PROBNP in the last 8760 hours. HbA1C: No results for input(s): HGBA1C in the last 72 hours. CBG: Recent Labs  Lab 12/17/21 1944 12/18/21 0002 12/18/21 0426 12/18/21 0759 12/18/21 1146  GLUCAP 120* 154* 157* 138* 122*   Lipid Profile: No results for input(s): CHOL, HDL, LDLCALC, TRIG, CHOLHDL, LDLDIRECT in the last 72 hours. Thyroid Function Tests: No results for input(s): TSH, T4TOTAL, FREET4, T3FREE, THYROIDAB in the last 72 hours. Anemia Panel: No results for input(s): VITAMINB12, FOLATE, FERRITIN, TIBC, IRON, RETICCTPCT in the last 72 hours. Sepsis Labs: No results for input(s): PROCALCITON, LATICACIDVEN in the last 168 hours.  Recent Results (from the past 240 hour(s))  Culture, Respiratory w Gram Stain     Status: None   Collection Time: 12/10/21  6:28 PM   Specimen: Tracheal Aspirate; Respiratory  Result Value Ref Range Status   Specimen Description TRACHEAL ASPIRATE  Final   Special Requests NONE  Final   Gram Stain   Final    FEW WBC PRESENT,BOTH PMN AND MONONUCLEAR RARE GRAM POSITIVE COCCI    Culture   Final    Normal respiratory flora-no Staph aureus or Pseudomonas seen Performed at [facility], [place] [address], [place], [place] [address]    Report Status 12/13/2021 FINAL  Final      Radiology Studies: DG CHEST PORT 1 VIEW  Result Date: 12/18/2021 CLINICAL DATA:  Aspiration pneumonia , trach present EXAM: PORTABLE CHEST - 1 VIEW COMPARISON:  12/10/2021  FINDINGS: Low lung volumes. Increasing infiltrate or atelectasis at the right lung base. Partial improvement in the left retrocardiac consolidation/atelectasis seen previously. Heart size and mediastinal contours are within normal limits. CABG markers. Blunting of the left lateral costophrenic angle as before. No pneumothorax. Sternotomy wires. Stable tracheostomy device. Gastric tube has been removed. IMPRESSION: 1. Low volumes with worsening atelectasis/infiltrate at the right lung base, improved aeration on  the left. Electronically Signed   By: Lucrezia Europe M.D.   On: 12/18/2021 11:59    Scheduled Meds:  amLODipine  10 mg Per Tube Daily   apixaban  5 mg Per Tube BID   atorvastatin  80 mg Per Tube Daily   bethanechol  10 mg Per Tube TID   chlorhexidine  15 mL Mouth Rinse BID   Chlorhexidine Gluconate Cloth  6 each Topical Daily   clonazepam  0.5 mg Per Tube QHS   cloNIDine  0.1 mg Per Tube Q8H   feeding supplement (PROSource TF)  90 mL Per Tube BID   fiber  1 packet Per Tube BID   free water  250 mL Per Tube Q6H   losartan  50 mg Per Tube Daily   mouth rinse  15 mL Mouth Rinse q12n4p   metoprolol tartrate  25 mg Per Tube BID   OLANZapine zydis  5 mg Sublingual Q8H   pantoprazole sodium  40 mg Per Tube Daily   tiZANidine  1 mg Per Tube TID   Continuous Infusions:  sodium chloride 250 mL (12/02/21 1930)   sodium chloride     feeding supplement (JEVITY 1.5 CAL/FIBER) 1,000 mL (12/17/21 1931)   lactated ringers Stopped (12/10/21 1750)     LOS: 23 days   Time spent: [place], MD [facility]  12/18/2021, 1:23 PM  Please page via Amion and do not message via secure chat for anything urgent. Secure chat can be used for anything non urgent.  How to contact the [facility] Attending or Consulting provider [place] or covering provider during after hours [place], for this patient?  Check the care team in [facility] and look for a) attending/consulting [facility] provider listed and b) the [facility]  team listed. Page or secure chat [phone]. Log into [email or web address] and use [place] universal password to access. If you do not have the password, please contact the hospital operator. Locate the [facility] provider you are looking for under [facility] and page to a number that you can be directly reached. If you still have difficulty reaching the provider, please page the [facility] (Director on Call) for the Hospitalists listed on amion for assistance.

## 2021-12-18 NOTE — Progress Notes (Signed)
Nutrition Follow-up  DOCUMENTATION CODES:   Not applicable  INTERVENTION:   Tube feeding via Cortrak tube:  Jevity 1.5 @ 55 ml/hr (1320 ml per day) Prosource TF 90 ml BID  Provides 2140 kcal, 128 gm protein, 1003 ml free water daily  250 ml free water every 6 hours - spoke with CCM will order   Total free water: 2003 ml  NUTRITION DIAGNOSIS:   Inadequate oral intake related to inability to eat as evidenced by NPO status. Ongoing.   GOAL:   Patient will meet greater than or equal to 90% of their needs Met with TF at goal.   MONITOR:   Diet advancement, TF tolerance  REASON FOR ASSESSMENT:   Consult  (free water needs)  ASSESSMENT:   Pt with PMH of CAD s/p CABG, HLD, OA, nephrolithiasis, BPH, and prostate cancer admitted with cerebellar stroke 12/11 s/p TNK.    Pt on trach collar day 2. Family looking for rehab.  Admission weight 87.3 kg and current weight 89.8 kg   12/11 admitted 12/13 pt with worsening edema s/p emergent crani 12/16 failed MBS, only allowed sips with awake 12/20 pt pulled out cortrak, that evening was re-intubated possible aspiration 12/21 s/p cortrak placement  12/22 extubated 12/25 NG tube placed  12/26 re-intubated s/p trach  12/29 s/p PEG placement 1/1 trach collar  Medications reviewed: nutrisource fiber BID, protonix   Labs reviewed: Na 142  Diet Order:   Diet Order             Diet NPO time specified  Diet effective midnight                   EDUCATION NEEDS:   Not appropriate for education at this time  Skin:  Skin Assessment: Reviewed RN Assessment (closed head incision)  Last BM:  1/2 x 3 (2 medium, 1 large type 6)  Height:   Ht Readings from Last 1 Encounters:  12/13/21 5' 10"  (1.778 m)    Weight:   Wt Readings from Last 1 Encounters:  12/16/21 89.8 kg    BMI:  Body mass index is 28.41 kg/m.  Estimated Nutritional Needs:   Kcal:  2100-2300  Protein:  115-125 grams  Fluid:  > 2  l/day  Lockie Pares., RD, LDN, CNSC See AMiON for contact information

## 2021-12-18 NOTE — Progress Notes (Signed)
Speech Language Pathology Treatment: [name] Speaking valve  Patient Details Name: [name] MRN: [id number] DOB: February 20, 1946 Today's Date: 12/18/2021 Time: 6301-6010 SLP Time Calculation (min) (ACUTE ONLY): 25 min  Assessment / Plan / Recommendation Clinical Impression  Mr. Oelkers was sleepy, but roused easily. PMV placed and he immediately began talking.  His son, [name], arrived recently from [place] and was at the bedside.  Pt demonstrated excellent toleration of PMV with regard to upper airway access. Voice is strong; vocal quality is wet.  Speech intelligibility ebbs and flows depending on MS and  delirium. He was able to answer some biographical questions about family/career appropriately.  Demonstrated to [name] purpose and function of PMV.  We discussed plan for rehab with regard to cognition, voice, and swallowing.  Bedside swallowing evaluation deferred today given delirium, tachycardia and tachypnea.  SLP wlll follow for readiness - he will need repeat MBS vs. FEES prior to resuming a diet.    HPI HPI: 76 yo male with stroke risk factors of CAD and HLD. Stat MRI showed bilateral cerebellar infarcts and pt noted to have developed ataxia. Patient was given TNK as he was still in the window. On repeat exam, prior to TNK, his NIHSS was 2 for right sided ataxia. Pt was evaluated by SLP and placed on a regular diet and nectar thick liquids given coughing with thin. Later, pt had a neurochange and underwent a suboccipital craniotomy to relieve ventricle pressure on brain stem with subsequent hemorrhagic conversion of cerebellar stroke. Pt's function has declined with increased signs of aspiration. MBS 12/16: moderate oropharyngeal dyspahgia with base of tongue propulsion particularly impacted, resulting in vallecular  residue. Sensed penetration and aspiration with ejection with nectar thick liquids primarily after the swallow. NPO status with allowance of nectar thick water. ETT 12/20-12/22;12/26  reintub and trach performed. 12/27 trach collar trials; 12/29 PEG      SLP Plan  Continue with current plan of care      Recommendations for follow up therapy are one component of a multi-disciplinary discharge planning process, led by the attending physician.  Recommendations may be updated based on patient status, additional functional criteria and insurance authorization.    Recommendations         Patient may use Passy-Muir Speech Valve: During all therapies with supervision PMSV Supervision: Full         Oral Care Recommendations: Oral care BID Follow Up Recommendations: Acute inpatient rehab (3hours/day) Assistance recommended at discharge: Frequent or constant Supervision/Assistance SLP Visit Diagnosis: Aphonia (R49.1) Plan: Continue with current plan of care         [name], [place] Office number [phone] Pager [phone]   [name]  12/18/2021, 11:34 AM

## 2021-12-18 NOTE — TOC Progression Note (Signed)
Transition of Care [facility]) - Progression Note    Patient Details  Name: [name] MRN: [id number] Date of Birth: 1946-10-27  Transition of Care [facility]) CM/SW Contact  Ella Bodo, RN Phone Number: 12/18/2021, 2:18 PM  Clinical Narrative:    [name] with [facility] Billings at [facility] rehab; she states that patient is declined for admission by their medical director, due to cuffed tracheostomy and maximum assistance required with therapy.  Patient would have to have a definitive plan for decannulation and uncuffed trach if discharging to this rehab center.   Would recommend that patient start CIR when medically stable; backup plan is [facility]. Both son and patient's wife prefer that patient start rehab here, rather than wait for approval from [facility].  Notified Mickel Baas, admissions coordinator with CIR; she states she will have Pamala Hurry, [facility] for CIR follow up with patient on 12/19/2021.  Wife states she will be in hospital on 1/4 until at least 2pm.     Expected Discharge Plan: [place] Barriers to Discharge: Continued Medical Work up  Expected Discharge Plan and Services Expected Discharge Plan: [place]   Discharge Planning Services: CM Consult Post Acute Care Choice: IP Rehab Living arrangements for the past 2 months: Single Family Home                                       Social Determinants of Health (SDOH) Interventions    Readmission Risk Interventions No flowsheet data found.  Reinaldo Raddle, RN, BSN  Trauma/Neuro ICU Case Manager [phone]

## 2021-12-18 NOTE — Progress Notes (Signed)
Physical Therapy Treatment Patient Details Name: [name] MRN: [id number] DOB: [birth year] Today's Date: 12/18/2021   History of Present Illness 76 yo male presenting to ED on 12/11 with ataxia and vertigo. 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 TNK given at 1214. S/p suboccipital crani for evacuation on 12/13. Required intubation 12/20-12/22. 12/26 trach 12/27 trach trials 12/29 Peg PMH including CAD s/p CABG x4, HLD, OA, nephrolithiasis, urinary retention, BPH with LUTS and prostate cancer.    PT Comments    Session limited today by pt internal distraction and restlessness, pulling at lines/ sheets/ gown throughout session distracting him from following commands. Pt initiating coming to EOB but once up would not participate in sit<>stand with multiple attempts and kept attempting to return to supine, to [facility] and to foot of bed. Son present and encouraging through session. Question if pt's BP dropped in sitting but monitor could not get reading due to continued musculature activation of pt's arm even when held still. Continue to recommend CIR.     Recommendations for follow up therapy are one component of a multi-disciplinary discharge planning process, led by the attending physician.  Recommendations may be updated based on patient status, additional functional criteria and insurance authorization.  Follow Up Recommendations  Acute inpatient rehab (3hours/day) (from [facility], would like to go to [facility] rehab)     Assistance Recommended at Discharge Frequent or constant Supervision/Assistance  Patient can return home with the following Two people to help with walking and/or transfers;Two people to help with bathing/dressing/bathroom;Assistance with cooking/housework;Assistance with feeding;Direct supervision/assist for medications management;Direct supervision/assist for financial management;Assist for transportation;Help with stairs or ramp for  entrance   Equipment Recommendations  Other (comment) (TBD)    Recommendations for Other Services Rehab consult     Precautions / Restrictions Precautions Precautions: Fall Precaution Comments: trach, peg, foley Restrictions Weight Bearing Restrictions: No Other Position/Activity Restrictions: Wears a lift in R shoe s/p hip replacement on L causing leg length discrepency     Mobility  Bed Mobility Overal bed mobility: Needs Assistance Bed Mobility: Rolling;Supine to Sit;Sit to Supine Rolling: Max assist;+2 for physical assistance;+2 for safety/equipment Sidelying to sit: +2 for physical assistance;+2 for safety/equipment;Max assist;HOB elevated   Sit to supine: Max assist;+2 for safety/equipment;+2 for physical assistance;HOB elevated   General bed mobility comments: pt seeming to want to come to EOB, max A +2 to complete SL to sit but once up, pt attempting to return to supine first to [facility] then to foot.    Transfers Overall transfer level: Needs assistance Equipment used: Ambulation equipment used Transfers: Sit to/from Stand Sit to Stand: +2 physical assistance;Max assist;From elevated surface;+2 safety/equipment           General transfer comment: Pt resisting attempts to assist with sit>stand. Tried 3x.  pt pushing and pulling at bed sheets. Encouraged by son which caused pt to look up but still did not follow commands to attempt standing Transfer via Lift Equipment: Stedy  Ambulation/Gait               General Gait Details: unable   Stairs             Wheelchair Mobility    Modified Rankin (Stroke Patients Only) Modified Rankin (Stroke Patients Only) Pre-Morbid Rankin Score: No symptoms Modified Rankin: Severe disability     Balance Overall balance assessment: Needs assistance Sitting-balance support: Bilateral upper extremity supported;Feet supported Sitting balance-Leahy Scale: Zero Sitting balance - Comments: posterior lean,  R and L  lean at times Postural control: Posterior lean                                  Cognition Arousal/Alertness: Awake/alert Behavior During Therapy: Restless;Flat affect;Impulsive Overall Cognitive Status: Difficult to assess Area of Impairment: Following commands                   Current Attention Level: Focused   Following Commands: Follows one step commands inconsistently;Follows one step commands with increased time Safety/Judgement: Decreased awareness of safety;Decreased awareness of deficits Awareness: Intellectual Problem Solving: Slow processing General Comments: pt attempting to pull at sheets and restless at eob. pt with PMV placed without any clear needs expressed. pt looking at son on command and when son speaks but not addressing him. Seemed very fatigued EOB        Exercises Other Exercises Other Exercises: poor simple command following and visual attention this session Other Exercises: pt very interally distracted and pulling at all lines/ leads when possible Other Exercises: pt motivated to remain supine at this time, son reports he was restless all night and this AM    General Comments General comments (skin integrity, edema, etc.): RN noted Afib with RVR prior to session. HR maintained low 100's throughout session, A-fib      Pertinent Vitals/Pain Pain Assessment: Faces Faces Pain Scale: Hurts a little bit Breathing: occasional labored breathing, short period of hyperventilation Negative Vocalization: occasional moan/groan, low speech, negative/disapproving quality Facial Expression: smiling or inexpressive Body Language: tense, distressed pacing, fidgeting Consolability: no need to console PAINAD Score: 3 Facial Expression: Relaxed, neutral Body Movements: Protection Muscle Tension: Tense, rigid Compliance with ventilator (intubated pts.): N/A Vocalization (extubated pts.): Talking in normal tone or no sound CPOT Total: 2 Pain  Location: generalized Pain Descriptors / Indicators: Restless Pain Intervention(s): Monitored during session    Home Living                          Prior Function            PT Goals (current goals can now be found in the care plan section) Acute Rehab PT Goals Patient Stated Goal: [facility] rehab per wife PT Goal Formulation: With patient/family Time For Goal Achievement: 12/26/21 Potential to Achieve Goals: Good Progress towards PT goals: Not progressing toward goals - comment (restlessness)    Frequency    Min 4X/week      PT Plan Current plan remains appropriate    Co-evaluation PT/OT/SLP Co-Evaluation/Treatment: Yes Reason for Co-Treatment: Complexity of the patient's impairments (multi-system involvement);Necessary to address cognition/behavior during functional activity;For patient/therapist safety PT goals addressed during session: Mobility/safety with mobility;Balance OT goals addressed during session: Strengthening/ROM;ADL's and self-care      AM-PAC PT "6 Clicks" Mobility   Outcome Measure  Help needed turning from your back to your side while in a flat bed without using bedrails?: Total Help needed moving from lying on your back to sitting on the side of a flat bed without using bedrails?: Total Help needed moving to and from a bed to a chair (including a wheelchair)?: Total Help needed standing up from a chair using your arms (e.g., wheelchair or bedside chair)?: Total Help needed to walk in hospital room?: Total Help needed climbing 3-5 steps with a railing? : Total 6 Click Score: 6    End of Session Equipment Utilized During  Treatment: Oxygen Activity Tolerance: Treatment limited secondary to agitation Patient left: in bed;with call bell/phone within reach;with family/visitor present;with restraints reapplied Nurse Communication: Mobility status PT Visit Diagnosis: Unsteadiness on feet (R26.81);Difficulty in walking, not elsewhere classified  (R26.2);Ataxic gait (R26.0)     Time: 4944-7395 PT Time Calculation (min) (ACUTE ONLY): 31 min  Charges:  $Therapeutic Activity: 8-22 mins                     Leighton Roach, PT  Acute Rehab Services  Pager [phone] Office [place] 12/18/2021, 4:33 PM

## 2021-12-18 NOTE — Progress Notes (Signed)
NAME:  [name], MRN:  [id number], DOB:  08/21/1946, LOS: 28 ADMISSION DATE:  11/25/2021, CONSULTATION DATE:  12/13 REFERRING MD:  Marcelle Overlie, CHIEF COMPLAINT:  Stroke symptoms   History of Present Illness:  76 year old man who presented to [facility] 12/11 with cerebellar stroke. Received TNKase; he developed increasing mass effect on the 4th ventricle and was transferred to the ICU. Suboccipital craniectomy completed 12/13.  Repeat CT 12/14 demonstrated hemorrhagic conversion.  Pertinent Medical History:  CAD s/p CABG  HLD  OA,  Nephrolithiasis BPH  Prostate cancer   Significant Hospital Events: Including procedures, antibiotic start and stop dates in addition to other pertinent events   12/11 admission, received TNKase 12/13 worsening edema/mass effect, had emergent craniotomy 12/14 hemorrhagic transformation of stroke 12/20 Finished unasyn for aspiration PNA, re intubated 12/22 Extubated 12/23 Palliative consulted 12/26 Re intubated. Tracheostomy performed, Finished ceftriaxone MRI> no new acute ischemia, improvement in brainstem compression, and abnormal signal at bilateral caudate tails and splenium  12/27 trach collar trials 12/28 off vent overnight  12/29 PEG, hypotensive post procedure, back on vent, CT head with no acute hemmorhage 1/1 Back to trach collar 1/3 Tolerating ATC, remains slightly delirious but able to follow commands/participate in care. Transfer to progressive pending while awaiting Rehab bed.  Interim History / Subjective:  Feeling ok this morning, appears frustrated trying to speak Son at bedside (arrived from [place]) Did well with PMV earlier in the day/yesterday Strong cough, able to clear secretions Remains on ATC, stable from respiratory standpoint for transfer to progressive Awaiting rehab bed  Objective:  Blood pressure (!) 157/67, pulse 62, temperature 100.1 F (37.8 C), temperature source Axillary, resp. rate 18, height 5\' 10"  (1.778 m),  weight 89.5 kg, SpO2 94 %.    FiO2 (%):  [28 %] 28 %   Intake/Output Summary (Last 24 hours) at 12/18/2021 0852 Last data filed at 12/18/2021 0800 Gross per 24 hour  Intake 2375 ml  Output 2200 ml  Net 175 ml    Filed Weights   12/14/21 0500 12/15/21 0500 12/16/21 0500  Weight: 89.8 kg 89.8 kg 89.8 kg   Physical Examination: General: Acutely ill-appearing elderly man in NAD. HEENT: [place]/AT, anicteric sclera, PERRL, moist mucous membranes. Tracheostomy status with mild-moderate clear yellow secretions, strong cough. Stoma without surrounding erythema or skin breakdown. Neuro: Awake, oriented x 1-2. Responds to verbal stimuli. Following commands intermittently. Moves all 4 extremities spontaneously.  CV: RRR, no m/g/r. PULM: Breathing even and unlabored on ATC (5L, 28% FiO2). Lung fields with scattered coarse rhonchi, clearing with cough. GI: Soft, nontender, nondistended. Normoactive bowel sounds. Extremities: No LE edema noted. Skin: Warm/dry, no rashes.  Resolved Hospital Problem List:  Hypernatremia Hypokalemia Hypotension s/p PEG placement  Assessment & Plan:  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 and compression of ventral pons s/p posterior decompression with craniectomy Hemorrhagic conversion of R cerebellar infarct 12/30 CT Head with no acute hemorrhage - TRH Primary, PCCM following for tracheostomy care at this juncture - Stroke team following - Eliquis (in the setting of Aflutter history) and statin for stroke prevention - ASA discontinued - Repeat MRI in 1-2 months per Neuro, will need outpatient f/u - Rehab bed pending [facility] closest to patient's home)  Acute hypoxic respiratory failure, s/p tracheostomy Possible aspiration pneumonia Right lower lobe pneumonia S/p 7 days Unasyn and 7 days of ceftriaxone, Off vent since 10 AM 12/28. Resumed s/p PEG.  - Continues to tolerate ATC for the last > 48H - Pulmonary  hygiene - Requiring  intermittent NTS via trach, though cough is stronger/secretions improving - DuoNebs PRN - Intermittent CXR to ensure resolution of PNA  Dysphagia Modified barium swallow showing aspiration, now s/p PED placed 12/29 - Continues to work with SLP - PMV as able  Chronic HFpEF Hypertension, uncontrolled CAD History of AFlutter History of HLD - Continue Norvasc, Losartan, Clonidine - Hydralazine/Metoprolol PRN while in-house - Continue Eliquis, statin  Hyperactive delirium, improving - Continue Zyprexa  Paroxysmal atrial flutter - Continue Eliquis - Metoprolol PRN  BPH - Continue urecholine - Trend UOP  GOC - PMT following  Remains stable for transfer to progressive vs. Rehab bed, once available. PCCM to continue to follow for tracheostomy management.  Best Practice (right click and "Reselect all SmartList Selections" daily)   Diet/type: NPO w/ meds via tube tube feeds DVT prophylaxis: LMWH GI prophylaxis: PPI Lines: N/A Foley:  N/A Code Status:  full code Last date of multidisciplinary goals of care discussion [12/26 remains full code]  Critical care time: N/A   Rhae Lerner [place] [facility] 12/18/21 8:52 AM  Please see [email or web address] for pager details.  From 7A-7P if no response, please call [phone] After hours, please call ELink [phone]

## 2021-12-19 DIAGNOSIS — I639 Cerebral infarction, unspecified: Secondary | ICD-10-CM | POA: Diagnosis not present

## 2021-12-19 LAB — CBC WITH DIFFERENTIAL/PLATELET
Abs Immature Granulocytes: 0.02 10*3/uL (ref 0.00–0.07)
Basophils Absolute: 0 10*3/uL (ref 0.0–0.1)
Basophils Relative: 1 %
Eosinophils Absolute: 0.2 10*3/uL (ref 0.0–0.5)
Eosinophils Relative: 3 %
HCT: 43.6 % (ref 39.0–52.0)
Hemoglobin: 14.7 g/dL (ref 13.0–17.0)
Immature Granulocytes: 0 %
Lymphocytes Relative: 18 %
Lymphs Abs: 1.1 10*3/uL (ref 0.7–4.0)
MCH: 30.4 pg (ref 26.0–34.0)
MCHC: 33.7 g/dL (ref 30.0–36.0)
MCV: 90.3 fL (ref 80.0–100.0)
Monocytes Absolute: 0.7 10*3/uL (ref 0.1–1.0)
Monocytes Relative: 12 %
Neutro Abs: 3.9 10*3/uL (ref 1.7–7.7)
Neutrophils Relative %: 66 %
Platelets: 249 10*3/uL (ref 150–400)
RBC: 4.83 MIL/uL (ref 4.22–5.81)
RDW: 14 % (ref 11.5–15.5)
WBC: 6 10*3/uL (ref 4.0–10.5)
nRBC: 0 % (ref 0.0–0.2)

## 2021-12-19 LAB — GLUCOSE, CAPILLARY
Glucose-Capillary: 135 mg/dL — ABNORMAL HIGH (ref 70–99)
Glucose-Capillary: 172 mg/dL — ABNORMAL HIGH (ref 70–99)
Glucose-Capillary: 139 mg/dL — ABNORMAL HIGH (ref 70–99)
Glucose-Capillary: 130 mg/dL — ABNORMAL HIGH (ref 70–99)
Glucose-Capillary: 107 mg/dL — ABNORMAL HIGH (ref 70–99)
Glucose-Capillary: 122 mg/dL — ABNORMAL HIGH (ref 70–99)

## 2021-12-19 LAB — BASIC METABOLIC PANEL
Anion gap: 7 (ref 5–15)
BUN: 14 mg/dL (ref 8–23)
CO2: 27 mmol/L (ref 22–32)
Calcium: 8.5 mg/dL — ABNORMAL LOW (ref 8.9–10.3)
Chloride: 101 mmol/L (ref 98–111)
Creatinine, Ser: 0.6 mg/dL — ABNORMAL LOW (ref 0.61–1.24)
GFR, Estimated: >60 mL/min (ref >60)
Glucose, Bld: 151 mg/dL — ABNORMAL HIGH (ref 70–99)
Potassium: 4.2 mmol/L (ref 3.5–5.1)
Sodium: 135 mmol/L (ref 135–145)

## 2021-12-19 MED ORDER — OLANZAPINE 5 MG PO TBDP
5.0000 mg | ORAL_TABLET | Freq: Two times a day (BID) | ORAL | Status: DC
Start: 2021-12-19 — End: 2021-12-20
  Administered 2021-12-19 – 2021-12-20 (×2): 5 mg via SUBLINGUAL
  Filled 2021-12-19 (×4): qty 1

## 2021-12-19 NOTE — Progress Notes (Signed)
Physical Therapy Treatment Patient Details Name: [name] MRN: [id number] DOB: 1946/04/17 Today's Date: 12/19/2021   History of Present Illness 76 yo male presenting to ED on 12/11 with ataxia and vertigo. 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 TNK given at 1214. S/p suboccipital crani for evacuation on 12/13. Required intubation 12/20-12/22. 12/26 trach 12/27 trach trials 12/29 Peg PMH including CAD s/p CABG x4, HLD, OA, nephrolithiasis, urinary retention, BPH with LUTS and prostate cancer.    PT Comments    Pt tolerates treatment well, performing multiple transfers and demonstrating much improved command following. Pt is more easily redirected when reaching for lines this session, and demonstrates good motivation to participate in transfer training. Pt will benefit from continued aggressive mobilization to aide in improving mobility quality and reducing falls risk. Pt remains a great candidate for high intensity inpatient PT services.  Recommendations for follow up therapy are one component of a multi-disciplinary discharge planning process, led by the attending physician.  Recommendations may be updated based on patient status, additional functional criteria and insurance authorization.  Follow Up Recommendations  Acute inpatient rehab (3hours/day)     Assistance Recommended at Discharge Frequent or constant Supervision/Assistance  Patient can return home with the following Two people to help with walking and/or transfers;Two people to help with bathing/dressing/bathroom;Direct supervision/assist for medications management;Direct supervision/assist for financial management;Assist for transportation;Help with stairs or ramp for entrance   Equipment Recommendations  Wheelchair (measurements PT);Wheelchair cushion (measurements PT);Hospital bed;Other (comment) (hoyer lift)    Recommendations for Other Services       Precautions /  Restrictions Precautions Precautions: Fall Precaution Comments: trach, peg, primo fit Restrictions Weight Bearing Restrictions: No Other Position/Activity Restrictions: Wears a lift in R shoe s/p hip replacement on L causing leg length discrepency     Mobility  Bed Mobility Overal bed mobility: Needs Assistance Bed Mobility: Supine to Sit;Sit to Supine   Sidelying to sit: Mod assist;HOB elevated Supine to sit: Min assist     General bed mobility comments: pt requiring assistance to pivot hips and elevate trunk into sitting    Transfers Overall transfer level: Needs assistance Equipment used: 1 person hand held assist Transfers: Sit to/from Stand Sit to Stand: Mod assist           General transfer comment: pt with R lateral  lean, benefits from verbal cue to increase trunk flexion. Pt performs 6 sit to stands during session    Ambulation/Gait Ambulation/Gait assistance:  (deferred due to safety concerns)                 Stairs             Wheelchair Mobility    Modified Rankin (Stroke Patients Only) Modified Rankin (Stroke Patients Only) Pre-Morbid Rankin Score: No symptoms Modified Rankin: Severe disability     Balance Overall balance assessment: Needs assistance Sitting-balance support: No upper extremity supported;Feet supported Sitting balance-Leahy Scale: Poor Sitting balance - Comments: minA, posterior lean Postural control: Posterior lean Standing balance support: Bilateral upper extremity supported Standing balance-Leahy Scale: Poor Standing balance comment: modA, R lateral lean                            Cognition Arousal/Alertness: Awake/alert Behavior During Therapy: Restless;Impulsive (reaching at lines intermittently during session) Overall Cognitive Status: Difficult to assess Area of Impairment: Following commands  Following Commands: Follows one step commands consistently;Follows one  step commands with increased time       General Comments: pt does redirect when told to let go of lines, follows commands for mobility well        Exercises      General Comments General comments (skin integrity, edema, etc.): VSS on RA, BP 104/70 in sitting      Pertinent Vitals/Pain Pain Assessment: Faces Faces Pain Scale: No hurt    Home Living                          Prior Function            PT Goals (current goals can now be found in the care plan section) Acute Rehab PT Goals Patient Stated Goal: goal to discharge to AIR Progress towards PT goals: Progressing toward goals    Frequency    Min 4X/week      PT Plan Current plan remains appropriate    Co-evaluation              AM-PAC PT "6 Clicks" Mobility   Outcome Measure  Help needed turning from your back to your side while in a flat bed without using bedrails?: A Little Help needed moving from lying on your back to sitting on the side of a flat bed without using bedrails?: A Lot Help needed moving to and from a bed to a chair (including a wheelchair)?: A Lot Help needed standing up from a chair using your arms (e.g., wheelchair or bedside chair)?: A Lot Help needed to walk in hospital room?: Total Help needed climbing 3-5 steps with a railing? : Total 6 Click Score: 11    End of Session   Activity Tolerance: Patient tolerated treatment well Patient left: in bed;with call bell/phone within reach;with bed alarm set;with restraints reapplied;with family/visitor present Nurse Communication: Mobility status PT Visit Diagnosis: Unsteadiness on feet (R26.81);Difficulty in walking, not elsewhere classified (R26.2);Ataxic gait (R26.0)     Time: 7867-6720 PT Time Calculation (min) (ACUTE ONLY): 30 min  Charges:  $Therapeutic Activity: 23-37 mins                     Zenaida Niece, PT, DPT Acute Rehabilitation Pager: [phone] Office [place] [name] 12/19/2021, 5:59  PM

## 2021-12-19 NOTE — Progress Notes (Signed)
PROGRESS NOTE    [name]  YIR:[id number] DOB: Oct 17, 1946 DOA: 11/25/2021 PCP: Rogelia Rohrer, MD   Brief Narrative:  76 year old man who presented to [facility] 12/11 with cerebellar stroke. Received TNKase; he developed increasing mass effect on the 4th ventricle and was transferred to the ICU. Suboccipital craniectomy completed 12/13.  Repeat CT 12/14 demonstrated hemorrhagic conversion.   Significant Hospital Events: Including procedures, antibiotic start and stop dates in addition to other pertinent events   12/11 admission, received TNKase 12/13 worsening edema/mass effect, had emergent craniotomy 12/14 hemorrhagic transformation of stroke 12/20 Finished unasyn for aspiration PNA, re intubated 12/22 Extubated 12/23 Palliative consulted 12/26 Re intubated. Tracheostomy performed, Finished ceftriaxone MRI> no new acute ischemia, improvement in brainstem compression, and abnormal signal at bilateral caudate tails and splenium  12/27 trach collar trials 12/28 off vent overnight  12/29 PEG, hypotensive post procedure, back on vent, CT head with no acute hemmorhage 1/1 Back to trach collar 1/3 Tolerating ATC, remains slightly delirious but able to follow commands/participate in care.  Transferred under [place]  Assessment & Plan:   Principal Problem:   Cerebellar stroke [facility]) Active Problems:   Atrial flutter (HCC)   Acute hypercapnic respiratory failure (HCC)   Massive aspiration syndrome   Acute ischemic stroke (HCC)   Acute respiratory failure with hypoxia (HCC)   Endotracheal tube present  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 and compression of ventral pons s/p posterior decompression with craniectomy Hemorrhagic conversion of R cerebellar infarct 12/30 CT Head with no acute hemorrhage - Stroke team following and managing. - ASA discontinued and Eliquis started on 12/17/2021 due to atrial fibrillation. - Repeat MRI in 1-2 months per Neuro, will need outpatient  f/u - Rehab bed pending here at [facility].   Acute hypoxic respiratory failure, s/p tracheostomy Possible aspiration pneumonia Right lower lobe pneumonia S/p 7 days Unasyn and 7 days of ceftriaxone, Off vent since 10 AM 12/28. Resumed s/p PEG.  - Continues to tolerate ATC for the last > 48H - Pulmonary hygiene - Requiring intermittent NTS via trach, though cough is stronger/secretions improving - DuoNebs PRN - Intermittent CXR to ensure resolution of PNA.  Due to tachypnea, I ordered repeat chest x-ray which shows worsening atelectasis and infiltrates.  However procalcitonin is unremarkable. Management per PCCM.   Dysphagia Modified barium swallow showing aspiration, now s/p PED placed 12/29 - Continues to work with SLP - PMV as able   CAD with chronic HFpEF: Stable.  Hypertension: Very well controlled..  Currently on amlodipine 10 mg, clonidine 0.1 mg 3 times daily and losartan 50 mg, Lopressor 25 mg twice daily.  New onset atrial fibrillation: Started on Eliquis 12/17/2021.  Rate controlled on Lopressor.  HLD: Statin.   Hyperactive delirium, improving - Continue Zyprexa   BPH - Continue urecholine - Trend UOP.  DVT prophylaxis: Place and maintain sequential compression device Start: 12/05/21 0838 SCD's Start: 11/25/21 1246   Code Status: Full Code  Family Communication: Patient's wife present at bedside.  Plan of care discussed with her in length and he verbalized understanding and agreed with it.  Status is: Inpatient  Remains inpatient appropriate because: Still sick and waiting for inpatient rehab  Estimated body mass index is 28.41 kg/m as calculated from the following:   Height as of this encounter: 5\' 10"  (1.778 m).   Weight as of this encounter: 89.8 kg.     Nutritional Assessment: Body mass index is 28.41 kg/m.Marland Kitchen Seen by dietician.  I agree with the assessment and  plan as outlined below: Nutrition Status: Nutrition Problem: Inadequate oral intake Etiology:  inability to eat Signs/Symptoms: NPO status Interventions: Tube feeding, Prostat  .  Skin Assessment: I have examined the patient's skin and I agree with the wound assessment as performed by the wound care RN as outlined below:    Consultants:  Neurology PCCM Neurosurgery Cardiology Palliative care  Procedures:  As above  Antimicrobials:  Anti-infectives (From admission, onward)    Start     Dose/Rate Route Frequency Ordered Stop   12/06/21 0000  vancomycin (VANCOREADY) IVPB 1500 mg/300 mL  Status:  Discontinued        1,500 mg 150 mL/hr over 120 Minutes Intravenous Every 24 hours 12/05/21 1105 12/06/21 0711   12/05/21 0000  vancomycin (VANCOREADY) IVPB 1250 mg/250 mL  Status:  Discontinued        1,250 mg 166.7 mL/hr over 90 Minutes Intravenous Every 24 hours 12/04/21 2304 12/05/21 1106   12/04/21 2345  cefTRIAXone (ROCEPHIN) 2 g in sodium chloride 0.9 % 100 mL IVPB        2 g 200 mL/hr over 30 Minutes Intravenous Every 24 hours 12/04/21 2258 12/10/21 2308   11/30/21 0600  vancomycin (VANCOREADY) IVPB 1250 mg/250 mL  Status:  Discontinued        1,250 mg 166.7 mL/hr over 90 Minutes Intravenous Every 24 hours 11/29/21 0425 11/30/21 0909   11/29/21 0515  vancomycin (VANCOREADY) IVPB 750 mg/150 mL  Status:  Discontinued        750 mg 150 mL/hr over 60 Minutes Intravenous Every 8 hours 11/29/21 0415 11/29/21 0420   11/29/21 0515  vancomycin (VANCOREADY) IVPB 2000 mg/400 mL        2,000 mg 200 mL/hr over 120 Minutes Intravenous  Once 11/29/21 0425 11/29/21 0656   11/27/21 0930  Ampicillin-Sulbactam (UNASYN) 3 g in sodium chloride 0.9 % 100 mL IVPB        3 g 200 mL/hr over 30 Minutes Intravenous Every 6 hours 11/27/21 0844 12/03/21 2228          Subjective: Seen and examined.  Patient not able to have any conversation but appears stable and comfortable.  Objective: Vitals:   12/19/21 0900 12/19/21 1000 12/19/21 1100 12/19/21 1200  BP: 133/84 115/69 113/66 (!)  105/53  Pulse: 84 80 79 80  Resp: (!) 30 (!) 33 (!) 21 (!) 31  Temp:    98.5 F (36.9 C)  TempSrc:    Axillary  SpO2: 100% 98% 100% 100%  Weight:      Height:        Intake/Output Summary (Last 24 hours) at 12/19/2021 1303 Last data filed at 12/19/2021 0900 Gross per 24 hour  Intake 1600 ml  Output 2400 ml  Net -800 ml    Filed Weights   12/14/21 0500 12/15/21 0500 12/16/21 0500  Weight: 89.8 kg 89.8 kg 89.8 kg    Examination:  General exam: Appears calm and comfortable  Respiratory system: Rhonchi bilaterally. Respiratory effort normal. Cardiovascular system: S1 & S2 heard, irregularly irregular rate and rhythm, no JVD, murmurs, rubs, gallops or clicks. No pedal edema. Gastrointestinal system: Abdomen is nondistended, soft and nontender. No organomegaly or masses felt. Normal bowel sounds heard. Central nervous system: Lethargic and possibly not oriented.  Not following commands  Data Reviewed: I have personally reviewed following labs and imaging studies  CBC: Recent Labs  Lab 12/14/21 0329 12/15/21 0330 12/16/21 0945 12/18/21 1358 12/19/21 0429  WBC 6.7 5.6 5.9 9.2 6.0  NEUTROABS  --   --   --  7.7 3.9  HGB 11.1* 10.8* 11.7* 13.8 14.7  HCT 33.0* 32.7* 35.0* 40.5 43.6  MCV 91.4 91.6 92.3 90.2 90.3  PLT 300 273 259 306 774    Basic Metabolic Panel: Recent Labs  Lab 12/14/21 0329 12/15/21 0330 12/16/21 0945 12/18/21 1358 12/19/21 0429  NA 140 142 139 135 135  K 3.9 3.8 3.9 4.1 4.2  CL 110 108 107 100 101  CO2 25 26 24 26 27   GLUCOSE 109* 107* 139* 138* 151*  BUN 23 19 19 18 14   CREATININE 0.76 0.74 0.70 0.66 0.60*  CALCIUM 7.8* 8.1* 8.1* 8.8* 8.5*  MG 2.4  --   --  2.2  --   PHOS 2.9  --   --   --   --     GFR: Estimated Creatinine Clearance: 91.3 mL/min (A) (by C-G formula based on SCr of 0.6 mg/dL (L)). Liver Function Tests: No results for input(s): AST, ALT, ALKPHOS, BILITOT, PROT, ALBUMIN in the last 168 hours. No results for input(s): LIPASE,  AMYLASE in the last 168 hours. No results for input(s): AMMONIA in the last 168 hours. Coagulation Profile: No results for input(s): INR, PROTIME in the last 168 hours. Cardiac Enzymes: No results for input(s): CKTOTAL, CKMB, CKMBINDEX, TROPONINI in the last 168 hours. BNP (last 3 results) No results for input(s): PROBNP in the last 8760 hours. HbA1C: No results for input(s): HGBA1C in the last 72 hours. CBG: Recent Labs  Lab 12/18/21 1919 12/19/21 0135 12/19/21 0537 12/19/21 0740 12/19/21 1127  GLUCAP 109* 135* 172* 139* 130*    Lipid Profile: No results for input(s): CHOL, HDL, LDLCALC, TRIG, CHOLHDL, LDLDIRECT in the last 72 hours. Thyroid Function Tests: No results for input(s): TSH, T4TOTAL, FREET4, T3FREE, THYROIDAB in the last 72 hours. Anemia Panel: No results for input(s): VITAMINB12, FOLATE, FERRITIN, TIBC, IRON, RETICCTPCT in the last 72 hours. Sepsis Labs: Recent Labs  Lab 12/18/21 1358  PROCALCITON <0.10    Recent Results (from the past 240 hour(s))  Culture, Respiratory w Gram Stain     Status: None   Collection Time: 12/10/21  6:28 PM   Specimen: Tracheal Aspirate; Respiratory  Result Value Ref Range Status   Specimen Description TRACHEAL ASPIRATE  Final   Special Requests NONE  Final   Gram Stain   Final    FEW WBC PRESENT,BOTH PMN AND MONONUCLEAR RARE GRAM POSITIVE COCCI    Culture   Final    Normal respiratory flora-no Staph aureus or Pseudomonas seen Performed at [facility], [place] [address], [place], [place] [address]    Report Status 12/13/2021 FINAL  Final       Radiology Studies: DG CHEST PORT 1 VIEW  Result Date: 12/18/2021 CLINICAL DATA:  Aspiration pneumonia , trach present EXAM: PORTABLE CHEST - 1 VIEW COMPARISON:  12/10/2021 FINDINGS: Low lung volumes. Increasing infiltrate or atelectasis at the right lung base. Partial improvement in the left retrocardiac consolidation/atelectasis seen previously. Heart size and mediastinal  contours are within normal limits. CABG markers. Blunting of the left lateral costophrenic angle as before. No pneumothorax. Sternotomy wires. Stable tracheostomy device. Gastric tube has been removed. IMPRESSION: 1. Low volumes with worsening atelectasis/infiltrate at the right lung base, improved aeration on the left. Electronically Signed   By: Lucrezia Europe M.D.   On: 12/18/2021 11:59    Scheduled Meds:  amLODipine  10 mg Per Tube Daily   apixaban  5 mg  Per Tube BID   atorvastatin  80 mg Per Tube Daily   bethanechol  10 mg Per Tube TID   chlorhexidine  15 mL Mouth Rinse BID   Chlorhexidine Gluconate Cloth  6 each Topical Daily   clonazepam  0.5 mg Per Tube QHS   cloNIDine  0.1 mg Per Tube Q8H   feeding supplement (PROSource TF)  90 mL Per Tube BID   fiber  1 packet Per Tube BID   free water  250 mL Per Tube Q6H   losartan  50 mg Per Tube Daily   mouth rinse  15 mL Mouth Rinse q12n4p   metoprolol tartrate  25 mg Per Tube BID   OLANZapine zydis  5 mg Sublingual BID   pantoprazole sodium  40 mg Per Tube Daily   tiZANidine  1 mg Per Tube TID   Continuous Infusions:  sodium chloride 250 mL (12/02/21 1930)   sodium chloride     feeding supplement (JEVITY 1.5 CAL/FIBER) 1,000 mL (12/19/21 1148)     LOS: 24 days   Time spent: [place], MD [facility]  12/19/2021, 1:03 PM  Please page via [place] and do not message via secure chat for anything urgent. Secure chat can be used for anything non urgent.  How to contact the [facility] Attending or Consulting provider [place] or covering provider during after hours [place], for this patient?  Check the care team in [facility] and look for a) attending/consulting TR[facility] provider listed and b) the [facility] team listed. Page or secure chat [phone]. Log into [email or web address] and use [place]'s universal password to access. If you do not have the password, please contact the hospital operator. Locate the [facility] provider you are looking for under [facility] and page to a number that you can be directly reached. If you still have difficulty reaching the provider, please page the [facility] (Director on Call) for the Hospitalists listed on amion for assistance.

## 2021-12-19 NOTE — Progress Notes (Signed)
Inpatient Rehabilitation Admissions Coordinator   I met at beside with pt's wife. I discussed that I do feel he is a CIR candidate once able to tolerate more participation with therapies as well as downsized to a # 6 trach. She is in agreement, and I will follow his case daily.  Danne Baxter, RN, MSN Rehab Admissions Coordinator [phone] 12/19/2021 11:46 AM

## 2021-12-19 NOTE — Progress Notes (Addendum)
STROKE TEAM PROGRESS NOTE   INTERVAL HISTORY Patient is seen in his room with his wife, [name], at the bedside.  He is doing well and has been able to tolerate trach collar and he still has orders to transfer out of the ICU when a bed is available.  He is alert and agitation haas improved and he is able to follow commands.  He is still a little drowsy, Zyprexa lowered to twice daily.  Blood pressure remains adequately controlled.  He still has significant tracheostomy secretions requiring frequent suctioning.  BMP and CBC are unremarkable.  Continue working with physical therapy.  TOC involved for inpatient rehab placement.  Given patient's status he will probably be admitted to [facility] inpatient rehab  Vitals:   12/19/21 0600 12/19/21 0700 12/19/21 0800 12/19/21 0900  BP: 123/65 112/76 112/67 133/84  Pulse: 80 70 79 84  Resp: (!) 27 (!) 28 (!) 22 (!) 30  Temp:   98.8 F (37.1 C)   TempSrc:   Axillary   SpO2: 100% 99% 100% 100%  Weight:      Height:       CBC:  Recent Labs  Lab 12/18/21 1358 12/19/21 0429  WBC 9.2 6.0  NEUTROABS 7.7 3.9  HGB 13.8 14.7  HCT 40.5 43.6  MCV 90.2 90.3  PLT 306 518    Basic Metabolic Panel:  Recent Labs  Lab 12/14/21 0329 12/15/21 0330 12/18/21 1358 12/19/21 0429  NA 140   < > 135 135  K 3.9   < > 4.1 4.2  CL 110   < > 100 101  CO2 25   < > 26 27  GLUCOSE 109*   < > 138* 151*  BUN 23   < > 18 14  CREATININE 0.76   < > 0.66 0.60*  CALCIUM 7.8*   < > 8.8* 8.5*  MG 2.4  --  2.2  --   PHOS 2.9  --   --   --    < > = values in this interval not displayed.    Lipid Panel:  No results for input(s): CHOL, TRIG, HDL, CHOLHDL, VLDL, LDLCALC in the last 168 hours.   HgbA1c:  No results for input(s): HGBA1C in the last 168 hours.  Urine Drug Screen:  No results for input(s): LABOPIA, COCAINSCRNUR, LABBENZ, AMPHETMU, THCU, LABBARB in the last 168 hours.   Alcohol Level No results for input(s): ETH in the last 168 hours.  IMAGING past 24  hours DG CHEST PORT 1 VIEW  Result Date: 12/18/2021 CLINICAL DATA:  Aspiration pneumonia , trach present EXAM: PORTABLE CHEST - 1 VIEW COMPARISON:  12/10/2021 FINDINGS: Low lung volumes. Increasing infiltrate or atelectasis at the right lung base. Partial improvement in the left retrocardiac consolidation/atelectasis seen previously. Heart size and mediastinal contours are within normal limits. CABG markers. Blunting of the left lateral costophrenic angle as before. No pneumothorax. Sternotomy wires. Stable tracheostomy device. Gastric tube has been removed. IMPRESSION: 1. Low volumes with worsening atelectasis/infiltrate at the right lung base, improved aeration on the left. Electronically Signed   By: Lucrezia Europe M.D.   On: 12/18/2021 11:59    PHYSICAL EXAM  PHYSICAL EXAM   Temp:  [97.4 F (36.3 C)-99.2 F (37.3 C)] 98.8 F (37.1 C) (01/04 0800) Pulse Rate:  [70-115] 84 (01/04 0900) Resp:  [16-43] 30 (01/04 0900) BP: (95-160)/(57-103) 133/84 (01/04 0900) SpO2:  [96 %-100 %] 100 % (01/04 0900) FiO2 (%):  [25 %-28 %] 28 % (01/04 0800)  General - Well nourished, well developed elderly Caucasian male, tracheostomy in place, alert and following commands   Neurological: Awake and alert.  Follows commands well.  Cannot be tested due to tracheostomy but was able to speak earlier with speech therapist with valid.  PERRL, EOMI, no facial droop present, able to move all four extremities in response to commands with good strength.   ASSESSMENT/PLAN [name] is a 76 y.o. male with history of CABG x4, hyperlipidemia, prostate cancer, internal hemorrhoid, and a nonobstructive calculus in right interpole with hematuria presenting with acute onset vertigo nausea vomiting and an inability to walk.  At 10 AM on November 25, 2021 patient developed vertigo nausea and vomiting.  He also noted abdominal discomfort.  A code stroke was called on his arrival to triage.  Once the patient was in the CT scanner  he developed more nausea and vomiting he went to a stat MRI which was positive for bilateral cerebellar strokes.  He then developed ataxia in his bilateral upper and lower extremities.  He was then given TNKase and went for a stat CTA of the head.  No LVO was shown on the CTA. Repeat MRI showed progression of edema related to the cerebellar infarcts with mass effect on the 4th ventricle. Neurosurgery consulted and hypertonic saline initiated. 12/13 suboccipital craniectomy overnight.  Patient tolerated procedure well.  Speech therapy recommends resuming nectar thickened liquids with his diet.  Repeat CT 12/14 shows hemorrhagic transformation of patient's stroke.  Lovenox resumed as he is post 24 hours from hemorrhagic transformation of his stroke.  Patient's agitation has improved with trial of clonazepam, and his neurological exam has improved.  Palliative care has been consulted to aid in [place] discussions.  Repeat head CT shows no additional bleeding. Repeat MRI shows improved brainstem compression. Abnormal restriction signal at bilateral caudate tails and splenium. Tracheostomy done 12/26. PEG tube done 12/29.  Agitation improved on olanzapine.  Will transfer out of the ICU today.   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 ASPECTS 10.    Repeat CT-extensive swelling in the areas of the acute cerebellar infarction more extensive on the right than the left.  Mass-effect on the fourth ventricle.  No evidence of ventricular obstruction.  Stable lateral and third ventricles. Post craniectomy CT-Unchanged right greater than left cerebellar infarcts.  No new infarction.  Unchanged narrowing of the fourth ventricle.  No evidence of ventricular obstruction or hydrocephalus. Repeat CT 12/14 New hyperdensity in right cerebellar infarct, likely hemorrhage with no increased mass  effect. CTA head & neck unremarkable MRI  acute infarct of the cerebellum bilaterally.  negative for hemorrhage.  MRI Repeat 12/12- Interval progression of edema associated to bilateral cerebellar acute infarcts with mass effect on the fourth ventricle and low lying right cerebellar tonsil. No hydrocephalus CT head 12/14 -right cerebellum hemorrhagic transformation without hydrocephalus CT serial repeats - stable postoperative changes involving infarct resolving intraventricular blood.  No acute abnormalities MRI repeat 12/26- stable right cerebellum infarct with hemorrhagic transformation, much improved brainstem compression, fourth ventricle now only minimal compression. abnormal restriction signal at bilateral caudate tails and splenium, concerning for cytotoxic changes 2D Echo EF 70-75% LDL 106 HgbA1c 5.8 VTE prophylaxis - SCDs aspirin 81 mg daily prior to admission, was initially placed on aspirin 81, plavix discontinued due to hemorrhagic transformation of cerebellar stroke.  Switched to Eliquis 12/17/2021  therapy recommendations: Inpatient  rehab- [facility] rehab closer to patients home Disposition: Pending   Cerebellar Edema s/p suboccipital decompression  3% normal saline initiated on 12/12 Sodium goal is 150-155 Na 156 S/p suboccipital decompression with Dr. Zada Finders 3% d/c'd and then resumed due to sodium levels -> now off Close neuro monitoring CT 12/13 reveals unchanged infarcts without evidence of hydrocephalus CT head 12/14 - right cerebellum hemorrhagic transformation without hydrocephalus CT head serial repeats - Stable cerebellar infarct with hemorrhage.  No hydrocephalus or interval change Repeat MRI showed improved brainstem compression, abnormal signal at bilateral caudate tails and splenium   Hypertension -> hypotension Home meds: None fluctuates On clonidine 0.1 Q8  BP goal normotensive PRN hydralazine and levophed   Atrial flutter - new diagnosis Cardiology on  board As needed metoprolol for rate control Consider DOAC once CT stable - PEG placed 12/13/2021   Fever Leukocytosis Aspiration pneumonia Respiratory distress presumed aspiration pneumonia intubated 12/04/2021,then extubated Reintubated 12/26 due to respiratory distress, aspiration on secretions, tachypnea T-max 101.9-> afebrile->100.8-> 99-> 98.7 WBC 13.3->13.6->10.0->9.9-> 8.2->6.9-> 7.5->8.1-> 8.3-> 6.7->5.6-> 6.0 CXR 12/13 - Left lower lobe consolidation with small adjacent left pleural effusion. Findings are likely either due to pneumonia or aspiration. CXR 12/26 - Small left-sided pleural effusion with worsening consolidation at the left base. On unasyn -> rocephin Tolerated trach collar >24 hours- transfer to floor   Hyperlipidemia Home meds: Atorvastatin 80 mg, resumed in hospital LDL 106, goal < 70 Atorvastatin 80 mg daily Continue statin at discharge   Delirium/agitation Encephalopathy Precedex stopped due to bradycardia Fentanyl infusing after intubation Klonopin  0.5mg  QHS Olanzapine ODT 5 mg Q8h MRI 12/26 concerning for b/l caudate tails/splenium restriction - cytotoxic changes - may be able to explain pt delirium/agitation  Correct metabolic underlying cause Avoid significant electrolyte changes, avoid neurotoxic medications Repeat MRI in 1-2 months at outpt follow up Emphasize sleep hygiene Consider psychiatry consult if not improved   Dysphagia NPO for now MBSS done 12/16 NG placed 12/26- TF infusion PEG placed 12/13/2021 Tube feedings infusing   Other Stroke Risk Factors Advanced Age >/= 65  Obesity, Body mass index is 28.41 kg/m., BMI >/= 30 associated with increased.  Use of oral blood pressure medications.  Risk, recommend weight loss, diet and exercise as appropriate  Coronary artery disease s/p CABG   Other Active Problems Abdominal discomfort CTA abdomen pelvis-no evidence of active GI bleed.  Moderate aortic atherosclerosis.  Diverticulosis  with no evidence of diverticulitis. Internal hemorrhoid Nonobstructive calculus in right interpole Hematuria one month ago Urecholine 10 mg 3 times daily   Hospital day # 24   Patient seen and examined by NP/APP with MD. MD to update note as needed.   Janine Ores, DNP, FNP-BC [facility] Pager: [phone] 12/19/2021 9:24 AM  I have personally obtained history,examined this patient, reviewed notes, independently viewed imaging studies, participated in medical decision making and plan of care.ROS completed by me personally and pertinent positives fully documented  I have made any additions or clarifications directly to the above note. Agree with note above.  Transfer to neurology floor bed today.  Mobilize out of bed.  Continue ongoing therapies.  Hopefully transfer to rehab soon.  Long discussion with patient and wife and answered questions.  Discussed with Dr.Pahwani.  Greater than 50% time during this 35-minute visit was spent in counseling and coordination of care about his strokes and discussion about rehab needs and answering questions   Antony Contras, MD Medical Director [employer] Pager: [phone] 12/19/2021 3:36  PM     To contact Stroke Continuity provider, please refer to [email or web address] After hours, contact General Neurology

## 2021-12-20 ENCOUNTER — Other Ambulatory Visit (HOSPITAL_COMMUNITY): Payer: Self-pay

## 2021-12-20 DIAGNOSIS — I639 Cerebral infarction, unspecified: Secondary | ICD-10-CM | POA: Diagnosis not present

## 2021-12-20 LAB — GLUCOSE, CAPILLARY
Glucose-Capillary: 128 mg/dL — ABNORMAL HIGH (ref 70–99)
Glucose-Capillary: 140 mg/dL — ABNORMAL HIGH (ref 70–99)
Glucose-Capillary: 127 mg/dL — ABNORMAL HIGH (ref 70–99)
Glucose-Capillary: 143 mg/dL — ABNORMAL HIGH (ref 70–99)
Glucose-Capillary: 110 mg/dL — ABNORMAL HIGH (ref 70–99)
Glucose-Capillary: 124 mg/dL — ABNORMAL HIGH (ref 70–99)

## 2021-12-20 MED ORDER — OLANZAPINE 5 MG PO TBDP
5.0000 mg | ORAL_TABLET | Freq: Every day | ORAL | Status: DC | PRN
  Filled 2021-12-20: qty 1

## 2021-12-20 MED ORDER — OLANZAPINE 5 MG PO TBDP
5.0000 mg | ORAL_TABLET | Freq: Every day | ORAL | Status: DC
Start: 2021-12-20 — End: 2021-12-25
  Administered 2021-12-20 – 2021-12-24 (×5): 5 mg via SUBLINGUAL
  Filled 2021-12-20 (×6): qty 1

## 2021-12-20 NOTE — Care Management Important Message (Signed)
Important Message  Patient Details  Name: [name] MRN: [id number] Date of Birth: 23-Jun-1946   Medicare Important Message Given:  Yes     [name] Montine Circle 12/20/2021, 3:14 PM

## 2021-12-20 NOTE — Progress Notes (Signed)
Occupational Therapy Treatment Patient Details Name: [name] MRN: [id number] DOB: Jun 25, 1946 Today's Date: 12/20/2021   History of present illness 76 yo male presenting to ED on 12/11 with ataxia and vertigo. 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 TNK given at 1214. S/p suboccipital crani for evacuation on 12/13. Required intubation 12/20-12/22. 12/26 trach 12/27 trach trials 12/29 Peg PMH including CAD s/p CABG x4, HLD, OA, nephrolithiasis, urinary retention, BPH with LUTS and prostate cancer.   OT comments  Patient seen for PT/OT treatment with patient requiring +2 assistance to get to EOB.  Hand held tranfer +2 attempted from EOB to recliner but patient did not demonstrate safe standing and was +2 stand pivot transfer.  Patient performed 6 stands from recliner at sink with assistance for trunk and blocking LLE knee. Grooming attempted with washing face and combing hair with hand over hand assist to perform with little to no active participation from patient. Patient was able to identify comb but when asked to use comb he did not respond. Patient became increasingly difficulty to stand and was squat pivot transfer +2 to return to EOB.  Patient would benefit from further OT/PT intervention.    Recommendations for follow up therapy are one component of a multi-disciplinary discharge planning process, led by the attending physician.  Recommendations may be updated based on patient status, additional functional criteria and insurance authorization.    Follow Up Recommendations  Acute inpatient rehab (3hours/day)    Assistance Recommended at Discharge Frequent or constant Supervision/Assistance  Patient can return home with the following  Two people to help with bathing/dressing/bathroom;Two people to help with walking and/or transfers   Equipment Recommendations  Wheelchair (measurements OT);Wheelchair cushion (measurements OT);Hospital  bed;BSC/3in1    Recommendations for Other Services      Precautions / Restrictions Precautions Precautions: Fall Precaution Comments: trach, peg, primo fit Restrictions Weight Bearing Restrictions: No       Mobility Bed Mobility Overal bed mobility: Needs Assistance Bed Mobility: Supine to Sit;Sit to Supine     Supine to sit: Mod assist;+2 for physical assistance;+2 for safety/equipment;HOB elevated Sit to supine: Mod assist;+2 for physical assistance;+2 for safety/equipment   General bed mobility comments: patient participated more with sit to supine but continued to require assistance with trunk and BLEs    Transfers Overall transfer level: Needs assistance Equipment used: 2 person hand held assist Transfers: Sit to/from Stand;Bed to chair/wheelchair/BSC Sit to Stand: Mod assist;Max assist;+2 physical assistance Stand pivot transfers: Max assist;+2 physical assistance   Squat pivot transfers: Max assist;+2 physical assistance;+2 safety/equipment     General transfer comment: Patient was max assist +2 to transfer from EOB to recliner and max assist +2 to transfer from recliner to EOB due to fatigue     Balance Overall balance assessment: Needs assistance Sitting-balance support: No upper extremity supported;Feet supported Sitting balance-Leahy Scale: Poor Sitting balance - Comments: assistance with hand holding due to reaching for lines and assistance with trunk Postural control: Posterior lean Standing balance support: Bilateral upper extremity supported Standing balance-Leahy Scale: Poor Standing balance comment: max assist +2 to stand at sink                           ADL either performed or assessed with clinical judgement   ADL Overall ADL's : Needs assistance/impaired     Grooming: Maximal assistance Grooming Details (indicate cue type and reason): attempted to have patient comb hair  and wash face.  Hand over hand attempted with patient being  resistant                               General ADL Comments: unable to follow commands for grooming    Extremity/Trunk Assessment Upper Extremity Assessment RUE Deficits / Details: R UE weakness noted and decreased coordination. pt completed hand to noses to therapist hand and back on command. pt undershooting iwth task. pt with decreased strength to sustain against gravity RUE Sensation: WNL RUE Coordination: decreased gross motor;decreased fine motor LUE Deficits / Details: WFL AROM shoulder elbow and wrist. pt pulling at all lines/ lead with L. pt pulling at collar of trach LUE Coordination: decreased fine motor;decreased gross motor            Vision       Perception     Praxis      Cognition Arousal/Alertness: Awake/alert Behavior During Therapy: Restless;Impulsive (reached for trac and peg tube) Overall Cognitive Status: Difficult to assess Area of Impairment: Memory;Following commands                   Current Attention Level: Focused   Following Commands: Follows one step commands inconsistently;Follows one step commands with increased time Safety/Judgement: Decreased awareness of safety;Decreased awareness of deficits   Problem Solving: Slow processing General Comments: easily distracted on this date, reached for lines often.  participation and command following decreased with progression of treatment          Exercises     Shoulder Instructions       General Comments      Pertinent Vitals/ Pain       Pain Assessment: Faces Faces Pain Scale: No hurt  Home Living                                          Prior Functioning/Environment              Frequency  Min 2X/week        Progress Toward Goals  OT Goals(current goals can now be found in the care plan section)  Progress towards OT goals: Progressing toward goals  Acute Rehab OT Goals OT Goal Formulation: Patient unable to participate in goal  setting Time For Goal Achievement: 12/26/21 Potential to Achieve Goals: Good ADL Goals Pt Will Perform Grooming: with set-up;with supervision;sitting Pt Will Perform Lower Body Dressing: with min assist;sit to/from stand Pt Will Transfer to Toilet: with min assist;stand pivot transfer;bedside commode Pt Will Perform Toileting - Clothing Manipulation and hygiene: with min guard assist;sitting/lateral leans Additional ADL Goal #1: Pt will complete bed mobility mod (A) as precursor to adls.  Plan Discharge plan remains appropriate    Co-evaluation    PT/OT/SLP Co-Evaluation/Treatment: Yes Reason for Co-Treatment: Complexity of the patient's impairments (multi-system involvement);Necessary to address cognition/behavior during functional activity;For patient/therapist safety;To address functional/ADL transfers   OT goals addressed during session: ADL's and self-care      AM-PAC OT "6 Clicks" Daily Activity     Outcome Measure   Help from another person eating meals?: A Lot Help from another person taking care of personal grooming?: A Lot Help from another person toileting, which includes using toliet, bedpan, or urinal?: A Lot Help from another person bathing (including washing, rinsing, drying)?: A Lot Help from another  person to put on and taking off regular upper body clothing?: A Lot Help from another person to put on and taking off regular lower body clothing?: Total 6 Click Score: 11    End of Session    OT Visit Diagnosis: Unsteadiness on feet (R26.81);Other abnormalities of gait and mobility (R26.89);Muscle weakness (generalized) (M62.81);History of falling (Z91.81)   Activity Tolerance Patient tolerated treatment well;Patient limited by fatigue   Patient Left in bed;with call bell/phone within reach;with bed alarm set;with family/visitor present   Nurse Communication Mobility status        Time: 3435-6861 OT Time Calculation (min): 35 min  Charges: OT General  Charges $OT Visit: 1 Visit OT Treatments $Self Care/Home Management : 8-22 mins  Lodema Hong, [place]  Pager [phone] Office [place] 12/20/2021, 2:54 PM

## 2021-12-20 NOTE — TOC Benefit Eligibility Note (Signed)
Patient Teacher, English as a foreign language completed.    The patient is currently admitted and upon discharge could be taking Eliquis 5 mg.  The current 30 day co-pay is, $512.00 due to a $475.00 deductible.  Will be $37.00 after meeting deductible..   The patient is insured through [employer] Medicare Part D     Lyndel Safe, [place] Patient Advocate Specialist [facility] Patient Advocate Team Direct Number: [phone]  Fax: [phone]

## 2021-12-20 NOTE — Plan of Care (Signed)
°  Problem: Education: Goal: Knowledge of General Education information will improve Description: Including pain rating scale, medication(s)/side effects and non-pharmacologic comfort measures Outcome: Progressing   Problem: Clinical Measurements: Goal: Ability to maintain clinical measurements within normal limits will improve Outcome: Progressing Goal: Will remain free from infection Outcome: Progressing Goal: Diagnostic test results will improve Outcome: Progressing Goal: Respiratory complications will improve Outcome: Progressing Goal: Cardiovascular complication will be avoided Outcome: Progressing   Problem: Ischemic Stroke/TIA Tissue Perfusion: Goal: Complications of ischemic stroke/TIA will be minimized Outcome: Progressing   Problem: Safety: Goal: Non-violent Restraint(s) Outcome: Progressing

## 2021-12-20 NOTE — Progress Notes (Signed)
PROGRESS NOTE    [name]  BSJ:[id number] DOB: [birth year] DOA: 11/25/2021 PCP: Rogelia Rohrer, MD   Brief Narrative:  76 year old man who presented to [facility] 12/11 with cerebellar stroke. Received TNKase; he developed increasing mass effect on the 4th ventricle and was transferred to the ICU. Suboccipital craniectomy completed 12/13.  Repeat CT 12/14 demonstrated hemorrhagic conversion.   Significant Hospital Events: Including procedures, antibiotic start and stop dates in addition to other pertinent events   12/11 admission, received TNKase 12/13 worsening edema/mass effect, had emergent craniotomy 12/14 hemorrhagic transformation of stroke 12/20 Finished unasyn for aspiration PNA, re intubated 12/22 Extubated 12/23 Palliative consulted 12/26 Re intubated. Tracheostomy performed, Finished ceftriaxone MRI> no new acute ischemia, improvement in brainstem compression, and abnormal signal at bilateral caudate tails and splenium  12/27 trach collar trials 12/28 off vent overnight  12/29 PEG, hypotensive post procedure, back on vent, CT head with no acute hemmorhage 1/1 Back to trach collar 1/3 Tolerating ATC, remains slightly delirious but able to follow commands/participate in care.  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 - ASA discontinued and Eliquis started on 12/17/2021 due to atrial fibrillation. - Repeat MRI in 1-2 months per Neuro, will need outpatient  f/u - Rehab bed pending here at [facility].  They are waiting for his cuff to be downsized to #6 trach   Acute hypoxic respiratory failure, s/p tracheostomy Possible aspiration pneumonia Right lower lobe pneumonia S/p 7 days Unasyn and 7 days of ceftriaxone, Off vent since 10 AM 12/28. Resumed s/p PEG.  - Continues to tolerate ATC for the last > 48H - Pulmonary hygiene - Requiring intermittent NTS via trach, though cough is stronger/secretions improving - DuoNebs PRN - Intermittent CXR to ensure resolution of PNA.  Due to tachypnea, I ordered repeat chest x-ray which shows worsening atelectasis and infiltrates.  However procalcitonin is unremarkable. Management per PCCM.   Dysphagia Modified barium swallow showing aspiration, now s/p PED placed 12/29 - Continues to work with SLP - PMV as able   CAD with chronic HFpEF: Stable.  Hypertension: Very well controlled..  Currently on amlodipine 10 mg, clonidine 0.1 mg 3 times daily and losartan 50 mg, Lopressor 25 mg twice daily.  New onset atrial fibrillation: Started on Eliquis 12/17/2021.  Rate controlled on Lopressor.  HLD: Statin.   Hyperactive delirium, improving - Continue Zyprexa   BPH - Continue urecholine - Trend UOP.  DVT prophylaxis: Place and maintain sequential compression device Start: 12/05/21 0838 SCD's Start: 11/25/21 1246   Code Status: Full Code  Family Communication: Patient's son present at bedside.  Plan of care discussed with her in length and he verbalized understanding and agreed with it.  Status is: Inpatient  Remains inpatient appropriate because: Still sick and waiting for inpatient rehab  Estimated body mass index is 28.79 kg/m as calculated from the following:   Height as of this encounter: 5\' 10"  (1.778 m).   Weight as of this encounter: 91 kg.     Nutritional Assessment: Body mass index  is 28.79 kg/m.Marland Kitchen Seen by dietician.  I agree with the assessment and plan as outlined below: Nutrition  Status: Nutrition Problem: Inadequate oral intake Etiology: inability to eat Signs/Symptoms: NPO status Interventions: Tube feeding, Prostat  .  Skin Assessment: I have examined the patient's skin and I agree with the wound assessment as performed by the wound care RN as outlined below:    Consultants:  Neurology PCCM Neurosurgery Cardiology Palliative care  Procedures:  As above  Antimicrobials:  Anti-infectives (From admission, onward)    Start     Dose/Rate Route Frequency Ordered Stop   12/06/21 0000  vancomycin (VANCOREADY) IVPB 1500 mg/300 mL  Status:  Discontinued        1,500 mg 150 mL/hr over 120 Minutes Intravenous Every 24 hours 12/05/21 1105 12/06/21 0711   12/05/21 0000  vancomycin (VANCOREADY) IVPB 1250 mg/250 mL  Status:  Discontinued        1,250 mg 166.7 mL/hr over 90 Minutes Intravenous Every 24 hours 12/04/21 2304 12/05/21 1106   12/04/21 2345  cefTRIAXone (ROCEPHIN) 2 g in sodium chloride 0.9 % 100 mL IVPB        2 g 200 mL/hr over 30 Minutes Intravenous Every 24 hours 12/04/21 2258 12/10/21 2308   11/30/21 0600  vancomycin (VANCOREADY) IVPB 1250 mg/250 mL  Status:  Discontinued        1,250 mg 166.7 mL/hr over 90 Minutes Intravenous Every 24 hours 11/29/21 0425 11/30/21 0909   11/29/21 0515  vancomycin (VANCOREADY) IVPB 750 mg/150 mL  Status:  Discontinued        750 mg 150 mL/hr over 60 Minutes Intravenous Every 8 hours 11/29/21 0415 11/29/21 0420   11/29/21 0515  vancomycin (VANCOREADY) IVPB 2000 mg/400 mL        2,000 mg 200 mL/hr over 120 Minutes Intravenous  Once 11/29/21 0425 11/29/21 0656   11/27/21 0930  Ampicillin-Sulbactam (UNASYN) 3 g in sodium chloride 0.9 % 100 mL IVPB        3 g 200 mL/hr over 30 Minutes Intravenous Every 6 hours 11/27/21 0844 12/03/21 2228          Subjective:  Seen and examined.  Son at the bedside.  Patient lethargic but able to participate more in communication today and following commands.  Feels  well.  Objective: Vitals:   12/20/21 0500 12/20/21 0821 12/20/21 0921 12/20/21 1150  BP:  128/60 (!) 94/54 101/60  Pulse:  74 77 66  Resp:  (!) 26 18 20   Temp:  97.8 F (36.6 C)  98.4 F (36.9 C)  TempSrc:  Oral  Axillary  SpO2:  96% 97% 95%  Weight: 91 kg     Height:        Intake/Output Summary (Last 24 hours) at 12/20/2021 1308 Last data filed at 12/20/2021 0537 Gross per 24 hour  Intake --  Output 650 ml  Net -650 ml    Filed Weights   12/15/21 0500 12/16/21 0500 12/20/21 0500  Weight: 89.8 kg 89.8 kg 91 kg    Examination:  General exam: Appears calm and comfortable but slightly lethargic. Respiratory system: Clear to auscultation. Respiratory effort normal.  On trach Cardiovascular system: S1 & S2 heard, RRR. No JVD, murmurs, rubs, gallops or clicks. No pedal edema. Gastrointestinal system: Abdomen is nondistended, soft and nontender. No organomegaly or masses felt. Normal bowel sounds heard. Central nervous system: Slightly lethargic and appears to be oriented.  Following commands today.   Data Reviewed: I have personally reviewed following  labs and imaging studies  CBC: Recent Labs  Lab 12/14/21 0329 12/15/21 0330 12/16/21 0945 12/18/21 1358 12/19/21 0429  WBC 6.7 5.6 5.9 9.2 6.0  NEUTROABS  --   --   --  7.7 3.9  HGB 11.1* 10.8* 11.7* 13.8 14.7  HCT 33.0* 32.7* 35.0* 40.5 43.6  MCV 91.4 91.6 92.3 90.2 90.3  PLT 300 273 259 306 109    Basic Metabolic Panel: Recent Labs  Lab 12/14/21 0329 12/15/21 0330 12/16/21 0945 12/18/21 1358 12/19/21 0429  NA 140 142 139 135 135  K 3.9 3.8 3.9 4.1 4.2  CL 110 108 107 100 101  CO2 25 26 24 26 27   GLUCOSE 109* 107* 139* 138* 151*  BUN 23 19 19 18 14   CREATININE 0.76 0.74 0.70 0.66 0.60*  CALCIUM 7.8* 8.1* 8.1* 8.8* 8.5*  MG 2.4  --   --  2.2  --   PHOS 2.9  --   --   --   --     GFR: Estimated Creatinine Clearance: 91.9 mL/min (A) (by C-G formula based on SCr of 0.6 mg/dL (L)). Liver Function  Tests: No results for input(s): AST, ALT, ALKPHOS, BILITOT, PROT, ALBUMIN in the last 168 hours. No results for input(s): LIPASE, AMYLASE in the last 168 hours. No results for input(s): AMMONIA in the last 168 hours. Coagulation Profile: No results for input(s): INR, PROTIME in the last 168 hours. Cardiac Enzymes: No results for input(s): CKTOTAL, CKMB, CKMBINDEX, TROPONINI in the last 168 hours. BNP (last 3 results) No results for input(s): PROBNP in the last 8760 hours. HbA1C: No results for input(s): HGBA1C in the last 72 hours. CBG: Recent Labs  Lab 12/19/21 1948 12/19/21 2325 12/20/21 0348 12/20/21 0909 12/20/21 1222  GLUCAP 107* 122* 128* 140* 127*    Lipid Profile: No results for input(s): CHOL, HDL, LDLCALC, TRIG, CHOLHDL, LDLDIRECT in the last 72 hours. Thyroid Function Tests: No results for input(s): TSH, T4TOTAL, FREET4, T3FREE, THYROIDAB in the last 72 hours. Anemia Panel: No results for input(s): VITAMINB12, FOLATE, FERRITIN, TIBC, IRON, RETICCTPCT in the last 72 hours. Sepsis Labs: Recent Labs  Lab 12/18/21 1358  PROCALCITON <0.10     Recent Results (from the past 240 hour(s))  Culture, Respiratory w Gram Stain     Status: None   Collection Time: 12/10/21  6:28 PM   Specimen: Tracheal Aspirate; Respiratory  Result Value Ref Range Status   Specimen Description TRACHEAL ASPIRATE  Final   Special Requests NONE  Final   Gram Stain   Final    FEW WBC PRESENT,BOTH PMN AND MONONUCLEAR RARE GRAM POSITIVE COCCI    Culture   Final    Normal respiratory flora-no Staph aureus or Pseudomonas seen Performed at [facility], [place] [address] [place], [place] [address]    Report Status 12/13/2021 FINAL  Final       Radiology Studies: No results found.  Scheduled Meds:  amLODipine  10 mg Per Tube Daily   apixaban  5 mg Per Tube BID   atorvastatin  80 mg Per Tube Daily   bethanechol  10 mg Per Tube TID   chlorhexidine  15 mL Mouth Rinse BID    Chlorhexidine Gluconate Cloth  6 each Topical Daily   clonazepam  0.5 mg Per Tube QHS   cloNIDine  0.1 mg Per Tube Q8H   feeding supplement (PROSource TF)  90 mL Per Tube BID   fiber  1 packet Per Tube BID  free water  250 mL Per Tube Q6H   losartan  50 mg Per Tube Daily   mouth rinse  15 mL Mouth Rinse q12n4p   metoprolol tartrate  25 mg Per Tube BID   OLANZapine zydis  5 mg Sublingual QHS   pantoprazole sodium  40 mg Per Tube Daily   tiZANidine  1 mg Per Tube TID   Continuous Infusions:  sodium chloride 250 mL (12/02/21 1930)   sodium chloride     feeding supplement (JEVITY 1.5 CAL/FIBER) 1,000 mL (12/19/21 1148)     LOS: 25 days   Time spent: [place], MD [facility]  12/20/2021, 1:08 PM  Please page via [place] and do not message via secure chat for anything urgent. Secure chat can be used for anything non urgent.  How to contact the [facility] Attending or Consulting provider [place] or covering provider during after hours [place], for this patient?  Check the care team in [facility] and look for a) attending/consulting TR[facility] provider listed and b) the [facility] team listed. Page or secure chat [phone]. Log into [email or web address] and use [place]'s universal password to access. If you do not have the password, please contact the hospital operator. Locate the [facility] provider you are looking for under [facility] and page to a number that you can be directly reached. If you still have difficulty reaching the provider, please page the [facility] (Director on Call) for the Hospitalists listed on amion for assistance.

## 2021-12-20 NOTE — Progress Notes (Signed)
Speech Language Pathology Treatment: Nada Boozer Speaking valve  Patient Details Name: [name] MRN: [id number] DOB: 09-04-1946 Today's Date: 12/20/2021 Time: 7893-8101 SLP Time Calculation (min) (ACUTE ONLY): 10 min  Assessment / Plan / Recommendation Clinical Impression  Pt was seen for treatment with his sister present. Pt was asleep upon SLP's arrival and pt's sister reported that the pt has been tired since working with OT. Pt roused with verbal and tactile stimulation. Pt tolerated PMSV for 8 minutes without evidence of air trapping or respiratory distress. Pt produced individual words and two-word utterances with reduced articulatory precision, and reduced vocal intensity; the impact of lethargy on precision and intensity is considered. Pt exhibited difficulty maintaining alertness and the session was ultimately terminated prematurely for this reason. PMSV may continue to be used with all therapies given full supervision. SLP will continue to follow pt.     HPI HPI: 76 yo male with stroke risk factors of CAD and HLD. Stat MRI showed bilateral cerebellar infarcts and pt noted to have developed ataxia. Patient was given TNK as he was still in the window. On repeat exam, prior to TNK, his NIHSS was 2 for right sided ataxia. Pt was evaluated by SLP and placed on a regular diet and nectar thick liquids given coughing with thin. Later, pt had a neurochange and underwent a suboccipital craniotomy to relieve ventricle pressure on brain stem with subsequent hemorrhagic conversion of cerebellar stroke. Pt's function has declined with increased signs of aspiration. MBS 12/16: moderate oropharyngeal dyspahgia with base of tongue propulsion particularly impacted, resulting in vallecular  residue. Sensed penetration and aspiration with ejection with nectar thick liquids primarily after the swallow. NPO status with allowance of nectar thick water. ETT 12/20-12/22;12/26 reintub and trach performed. 12/27 trach  collar trials; 12/29 PEG      SLP Plan  Continue with current plan of care      Recommendations for follow up therapy are one component of a multi-disciplinary discharge planning process, led by the attending physician.  Recommendations may be updated based on patient status, additional functional criteria and insurance authorization.    Recommendations         Patient may use Passy-Muir Speech Valve: During all therapies with supervision PMSV Supervision: Full         Oral Care Recommendations: Oral care BID Follow Up Recommendations: Acute inpatient rehab (3hours/day) Assistance recommended at discharge: Frequent or constant Supervision/Assistance SLP Visit Diagnosis: Aphonia (R49.1) Plan: Continue with current plan of care         Jayana Kotula I. Hardin Negus, [place], [place] Office number [phone] Pager [place]  12/20/2021, 3:13 PM

## 2021-12-20 NOTE — Progress Notes (Addendum)
STROKE TEAM PROGRESS NOTE   INTERVAL HISTORY Patient is seen in his room with his son the bedside.  He is doing well and remains on trach collar.  He is alert and agitation haas improved and he is able to follow commands.  He is still a little drowsy despite lowering the Zyprexa yesterday.  Change Zyprexa to bedtime only starting tomorrow.  Blood pressure remains adequately controlled.  He still has significant tracheostomy secretions requiring frequent suctioning.  BMP and CBC are unremarkable.  Continue working with physical therapy.  TOC involved for inpatient rehab placement.  Given patient's status he is going to go to acute inpatient rehab.   Vitals:   12/20/21 0500 12/20/21 0821 12/20/21 0921 12/20/21 1150  BP:  128/60 (!) 94/54 101/60  Pulse:  74 77 66  Resp:  (!) 26 18 20   Temp:  97.8 F (36.6 C)  98.4 F (36.9 C)  TempSrc:  Oral  Axillary  SpO2:  96% 97% 95%  Weight: 91 kg     Height:       CBC:  Recent Labs  Lab 12/18/21 1358 12/19/21 0429  WBC 9.2 6.0  NEUTROABS 7.7 3.9  HGB 13.8 14.7  HCT 40.5 43.6  MCV 90.2 90.3  PLT 306 213    Basic Metabolic Panel:  Recent Labs  Lab 12/14/21 0329 12/15/21 0330 12/18/21 1358 12/19/21 0429  NA 140   < > 135 135  K 3.9   < > 4.1 4.2  CL 110   < > 100 101  CO2 25   < > 26 27  GLUCOSE 109*   < > 138* 151*  BUN 23   < > 18 14  CREATININE 0.76   < > 0.66 0.60*  CALCIUM 7.8*   < > 8.8* 8.5*  MG 2.4  --  2.2  --   PHOS 2.9  --   --   --    < > = values in this interval not displayed.    Lipid Panel:  No results for input(s): CHOL, TRIG, HDL, CHOLHDL, VLDL, LDLCALC in the last 168 hours.   HgbA1c:  No results for input(s): HGBA1C in the last 168 hours.  Urine Drug Screen:  No results for input(s): LABOPIA, COCAINSCRNUR, LABBENZ, AMPHETMU, THCU, LABBARB in the last 168 hours.   Alcohol Level No results for input(s): ETH in the last 168 hours.  IMAGING past 24 hours No results found.  PHYSICAL EXAM  Temp:  [97.8  F (36.6 C)-99.8 F (37.7 C)] 98.4 F (36.9 C) (01/05 1150) Pulse Rate:  [66-107] 66 (01/05 1150) Resp:  [13-28] 20 (01/05 1150) BP: (94-140)/(54-114) 101/60 (01/05 1150) SpO2:  [86 %-100 %] 95 % (01/05 1150) FiO2 (%):  [28 %] 28 % (01/05 0921) Weight:  [91 kg] 91 kg (01/05 0500)  General - Well nourished, well developed elderly Caucasian male, tracheostomy in place, alert and following commands  Neurological: Awake and alert.  Follows commands well.  Cannot be tested due to tracheostomy but was able to speak earlier with speech therapist with valid.  PERRL, EOMI, no facial droop present, able to move all four extremities in response to commands with good strength.   ASSESSMENT/PLAN [name] is a 76 y.o. male with history of CABG x4, hyperlipidemia, prostate cancer, internal hemorrhoid, and a nonobstructive calculus in right interpole with hematuria presenting with acute onset vertigo nausea vomiting and an inability to walk.  At 10 AM on November 25, 2021 patient developed vertigo  nausea and vomiting.  He also noted abdominal discomfort.  A code stroke was called on his arrival to triage.  Once the patient was in the CT scanner he developed more nausea and vomiting he went to a stat MRI which was positive for bilateral cerebellar strokes.  He then developed ataxia in his bilateral upper and lower extremities.  He was then given TNKase and went for a stat CTA of the head.  No LVO was shown on the CTA. Repeat MRI showed progression of edema related to the cerebellar infarcts with mass effect on the 4th ventricle. Neurosurgery consulted and hypertonic saline initiated. 12/13 suboccipital craniectomy overnight.  Patient tolerated procedure well.  Speech therapy recommends resuming nectar thickened liquids with his diet.  Repeat CT 12/14 shows hemorrhagic transformation of patient's stroke.  Lovenox resumed as he is post 24 hours from hemorrhagic transformation of his stroke.  Patient's  agitation has improved with trial of clonazepam, and his neurological exam has improved.  Palliative care has been consulted to aid in [place] discussions.  Repeat head CT shows no additional bleeding. Repeat MRI shows improved brainstem compression. Abnormal restriction signal at bilateral caudate tails and splenium. Tracheostomy done 12/26. PEG tube done 12/29.  Agitation improved on olanzapine.  Will transfer out of the ICU today.  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 ASPECTS 10.    Repeat CT-extensive swelling in the areas of the acute cerebellar infarction more extensive on the right than the left.  Mass-effect on the fourth ventricle.  No evidence of ventricular obstruction.  Stable lateral and third ventricles. Post craniectomy CT-Unchanged right greater than left cerebellar infarcts.  No new infarction.  Unchanged narrowing of the fourth ventricle.  No evidence of ventricular obstruction or hydrocephalus. Repeat CT 12/14 New hyperdensity in right cerebellar infarct, likely hemorrhage with no increased mass effect. CTA head & neck unremarkable MRI  acute infarct of the cerebellum bilaterally.  negative for hemorrhage.  MRI Repeat 12/12- Interval progression of edema associated to bilateral cerebellar acute infarcts with mass effect on the fourth ventricle and low lying right cerebellar tonsil. No hydrocephalus CT head 12/14 -right cerebellum hemorrhagic transformation without hydrocephalus CT serial repeats - stable postoperative changes involving infarct resolving intraventricular blood.  No acute abnormalities MRI repeat 12/26- stable right cerebellum infarct with hemorrhagic transformation, much improved brainstem compression, fourth ventricle now only minimal compression. abnormal restriction signal at bilateral caudate tails and splenium, concerning for  cytotoxic changes 2D Echo EF 70-75% LDL 106 HgbA1c 5.8 VTE prophylaxis - SCDs aspirin 81 mg daily prior to admission, was initially placed on aspirin 81, plavix discontinued due to hemorrhagic transformation of cerebellar stroke.  Switched to Eliquis 12/17/2021  therapy recommendations: Inpatient rehab-plan for [facility] rehab due to trach cuff  Disposition: Pending  Cerebellar Edema s/p suboccipital decompression  3% normal saline initiated on 12/12 Sodium goal is 150-155 Na 156 S/p suboccipital decompression with Dr. Zada Finders 3% d/c'd and then resumed due to sodium levels -> now off Close neuro monitoring CT 12/13 reveals unchanged infarcts without evidence of hydrocephalus CT head 12/14 - right cerebellum hemorrhagic transformation without hydrocephalus CT head serial repeats - Stable cerebellar infarct with hemorrhage.  No hydrocephalus or interval change Repeat MRI showed improved brainstem compression, abnormal signal at bilateral caudate tails and splenium  Hypertension -> hypotension Home meds: None fluctuates On clonidine 0.1 Q8  BP goal normotensive PRN hydralazine  and levophed  Atrial flutter - new diagnosis Cardiology on board As needed metoprolol for rate control Consider DOAC once CT stable - PEG placed 12/13/2021  Fever Leukocytosis Aspiration pneumonia Respiratory distress presumed aspiration pneumonia intubated 12/04/2021,then extubated Reintubated 12/26 due to respiratory distress, aspiration on secretions, tachypnea T-max 101.9-> afebrile->100.8-> 99-> 98.7 WBC 13.3->13.6->10.0->9.9-> 8.2->6.9-> 7.5->8.1-> 8.3-> 6.7->5.6-> 6.0-stabilized CXR 12/13 - Left lower lobe consolidation with small adjacent left pleural effusion. Findings are likely either due to pneumonia or aspiration. CXR 12/26 - Small left-sided pleural effusion with worsening consolidation at the left base. On unasyn -> rocephin  Hyperlipidemia Home meds: Atorvastatin 80 mg, resumed in  hospital LDL 106, goal < 70 Atorvastatin 80 mg daily Continue statin at discharge  Delirium/agitation Encephalopathy Precedex stopped due to bradycardia Fentanyl infusing after intubation Klonopin  0.5mg  QHS Olanzapine ODT 5 mg BID-> change to HS 12/21/2020-> add PRN dose as well MRI 12/26 concerning for b/l caudate tails/splenium restriction - cytotoxic changes - may be able to explain pt delirium/agitation  Correct metabolic underlying cause Avoid significant electrolyte changes, avoid neurotoxic medications Repeat MRI in 1-2 months at outpt follow up Emphasize sleep hygiene Consider psychiatry consult if not improved  Dysphagia NPO for now MBSS done 12/16 NG placed 12/26- TF infusion PEG placed 12/13/2021 Tube feedings infusing  Other Stroke Risk Factors Advanced Age >/= 65  Obesity, Body mass index is 28.79 kg/m., BMI >/= 30 associated with increased.  Use of oral blood pressure medications.  Risk, recommend weight loss, diet and exercise as appropriate  Coronary artery disease s/p CABG  Other Active Problems Abdominal discomfort CTA abdomen pelvis-no evidence of active GI bleed.  Moderate aortic atherosclerosis.  Diverticulosis with no evidence of diverticulitis. Internal hemorrhoid Nonobstructive calculus in right interpole Hematuria one month ago Urecholine 10 mg 3 times daily  Hospital day # 25  Patient seen and examined by NP/APP with MD. MD to update note as needed.   Janine Ores, DNP, FNP-BC [facility] Pager: [phone] I have personally obtained history,examined this patient, reviewed notes, independently viewed imaging studies, participated in medical decision making and plan of care.ROS completed by me personally and pertinent positives fully documented  I have made any additions or clarifications directly to the above note. Agree with note above.  Patient is showing gradual improvement.  We will reduce the dose of Zyprexa and continue  ongoing therapies.  Transfer to rehab when bed available.  Long discussion patient and family at the bedside and answered questions.  Discussed with Dr. Doristine Bosworth .  Greater than 50% time during this 35-minute visit was spent in counseling and coordination of care about his embolic strokes and atrial fibrillation and tracheostomy and answering questions.  Antony Contras, MD Medical Director [facility] Pager: [phone] 12/20/2021 2:34 PM     To contact Stroke Continuity provider, please refer to [email or web address] After hours, contact General Neurology

## 2021-12-20 NOTE — Discharge Instructions (Signed)

## 2021-12-20 NOTE — Progress Notes (Signed)
Physical Therapy Treatment Patient Details Name: [name] MRN: [id number] DOB: December 03, 1946 Today's Date: 12/20/2021   History of Present Illness 76 yo male presenting to ED on 12/11 with ataxia and vertigo. 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 TNK given at 1214. S/p suboccipital crani for evacuation on 12/13. Required intubation 12/20-12/22. 12/26 trach 12/27 trach trials 12/29 Peg PMH including CAD s/p CABG x4, HLD, OA, nephrolithiasis, urinary retention, BPH with LUTS and prostate cancer.    PT Comments    Pt restless and pulling at feeding tube/trach if hands not 100% occupied today. Pt tolerated several repeated transfers, goal for co-treat was ADLs at sink but pt with standing tolerance <30 seconds and became more resistant to standing with repeated attempts. Pt with significant LLE buckling and incoordination in standing, requires max PT facilitation and cuing. PT to continue to follow.    Recommendations for follow up therapy are one component of a multi-disciplinary discharge planning process, led by the attending physician.  Recommendations may be updated based on patient status, additional functional criteria and insurance authorization.  Follow Up Recommendations  Acute inpatient rehab (3hours/day)     Assistance Recommended at Discharge Frequent or constant Supervision/Assistance  Patient can return home with the following Two people to help with walking and/or transfers;Two people to help with bathing/dressing/bathroom;Direct supervision/assist for medications management;Direct supervision/assist for financial management;Assist for transportation;Help with stairs or ramp for entrance   Equipment Recommendations  Wheelchair (measurements PT);Wheelchair cushion (measurements PT);Hospital bed;Other (comment) (hoyer lift)    Recommendations for Other Services       Precautions / Restrictions Precautions Precautions:  Fall Precaution Comments: trach, peg, primo fit, wrist restraints and mitts bilat Restrictions Weight Bearing Restrictions: No Other Position/Activity Restrictions: Wears a lift in R shoe s/p hip replacement on L causing leg length discrepency     Mobility  Bed Mobility Overal bed mobility: Needs Assistance Bed Mobility: Supine to Sit;Sit to Supine     Supine to sit: Mod assist;+2 for physical assistance;+2 for safety/equipment;HOB elevated Sit to supine: Mod assist;+2 for physical assistance;+2 for safety/equipment   General bed mobility comments: patient participated more with sit to supine but continued to require assistance with trunk and BLEs, boost assist for positioning needed    Transfers Overall transfer level: Needs assistance Equipment used: 2 person hand held assist Transfers: Sit to/from Stand;Bed to chair/wheelchair/BSC Sit to Stand: Mod assist;Max assist;+2 physical assistance Stand pivot transfers: Max assist;+2 physical assistance Squat pivot transfers: Max assist;+2 physical assistance;+2 safety/equipment       General transfer comment: Patient was max assist +2 to transfer from EOB to recliner and max assist +2 to transfer from recliner to EOB due to fatigue. Sit<>stand x6 throughout session, at EOB and sink    Ambulation/Gait               General Gait Details: unable   Stairs             Wheelchair Mobility    Modified Rankin (Stroke Patients Only) Modified Rankin (Stroke Patients Only) Pre-Morbid Rankin Score: No symptoms Modified Rankin: Severe disability     Balance Overall balance assessment: Needs assistance Sitting-balance support: No upper extremity supported;Feet supported Sitting balance-Leahy Scale: Poor Sitting balance - Comments: assistance with hand holding due to reaching for lines and assistance with trunk Postural control: Posterior lean Standing balance support: Bilateral upper extremity supported Standing  balance-Leahy Scale: Poor Standing balance comment: max assist +2 to stand at sink  Cognition Arousal/Alertness: Awake/alert Behavior During Therapy: Restless;Impulsive (reached for trac and peg tube) Overall Cognitive Status: Difficult to assess Area of Impairment: Memory;Following commands                   Current Attention Level: Focused   Following Commands: Follows one step commands inconsistently;Follows one step commands with increased time Safety/Judgement: Decreased awareness of safety;Decreased awareness of deficits   Problem Solving: Slow processing General Comments: easily distracted on this date, reached for lines often.  participation and command following decreased with progression of treatment        Exercises      General Comments        Pertinent Vitals/Pain Pain Assessment: Faces Faces Pain Scale: No hurt Pain Intervention(s): Monitored during session    Home Living                          Prior Function            PT Goals (current goals can now be found in the care plan section) Acute Rehab PT Goals Patient Stated Goal: goal to discharge to [facility] PT Goal Formulation: With patient/family Time For Goal Achievement: 12/26/21 Potential to Achieve Goals: Good Progress towards PT goals: Progressing toward goals    Frequency    Min 4X/week      PT Plan Current plan remains appropriate    Co-evaluation PT/OT/SLP Co-Evaluation/Treatment: Yes Reason for Co-Treatment: For patient/therapist safety;To address functional/ADL transfers;Necessary to address cognition/behavior during functional activity PT goals addressed during session: Mobility/safety with mobility;Balance OT goals addressed during session: ADL's and self-care      AM-PAC PT "6 Clicks" Mobility   Outcome Measure  Help needed turning from your back to your side while in a flat bed without using bedrails?: A Lot Help needed  moving from lying on your back to sitting on the side of a flat bed without using bedrails?: A Lot Help needed moving to and from a bed to a chair (including a wheelchair)?: A Lot Help needed standing up from a chair using your arms (e.g., wheelchair or bedside chair)?: A Lot Help needed to walk in hospital room?: Total Help needed climbing 3-5 steps with a railing? : Total 6 Click Score: 10    End of Session   Activity Tolerance: Patient tolerated treatment well Patient left: in bed;with call bell/phone within reach;with bed alarm set;with restraints reapplied;with family/visitor present Nurse Communication: Mobility status PT Visit Diagnosis: Unsteadiness on feet (R26.81);Difficulty in walking, not elsewhere classified (R26.2);Ataxic gait (R26.0)     Time: 7672-0947 PT Time Calculation (min) (ACUTE ONLY): 35 min  Charges:  $Therapeutic Activity: 8-22 mins                     Stacie Glaze, PT DPT Acute Rehabilitation Services Pager [phone]  Office [phone]    [place] 12/20/2021, 3:42 PM

## 2021-12-21 DIAGNOSIS — I639 Cerebral infarction, unspecified: Secondary | ICD-10-CM | POA: Diagnosis not present

## 2021-12-21 LAB — GLUCOSE, CAPILLARY
Glucose-Capillary: 128 mg/dL — ABNORMAL HIGH (ref 70–99)
Glucose-Capillary: 139 mg/dL — ABNORMAL HIGH (ref 70–99)
Glucose-Capillary: 130 mg/dL — ABNORMAL HIGH (ref 70–99)
Glucose-Capillary: 115 mg/dL — ABNORMAL HIGH (ref 70–99)

## 2021-12-21 MED ORDER — SCOPOLAMINE 1 MG/3DAYS TD PT72
1.0000 | MEDICATED_PATCH | TRANSDERMAL | Status: DC
  Administered 2021-12-21 – 2021-12-24 (×2): 1.5 mg via TRANSDERMAL
  Filled 2021-12-21 (×2): qty 1

## 2021-12-21 NOTE — Progress Notes (Signed)
STROKE TEAM PROGRESS NOTE   INTERVAL HISTORY Patient is seen in his room with his sister-in-law at the bedside.  He is doing well and remains on trach collar.  He is alert and agitation haas improved  Blood pressure remains adequately controlled.  He still has significant tracheostomy secretions requiring frequent suctioning.  BMP and CBC are unremarkable.  Continue working with physical therapy.  TOC involved for inpatient rehab placement.  Given patient's status he is going to go to acute inpatient rehab but they are waiting for his tracheostomy cuff to be downsized to # 6 trach.  Vital signs stable.  Vitals:   12/21/21 0818 12/21/21 0915 12/21/21 1122 12/21/21 1156  BP: (!) 146/77 (!) 153/68    Pulse: (!) 58 63 (!) 58   Resp: 16 18 18    Temp: 98.8 F (37.1 C)   98.7 F (37.1 C)  TempSrc: Axillary   Oral  SpO2: 98% 100% 95%   Weight:      Height:       CBC:  Recent Labs  Lab 12/18/21 1358 12/19/21 0429  WBC 9.2 6.0  NEUTROABS 7.7 3.9  HGB 13.8 14.7  HCT 40.5 43.6  MCV 90.2 90.3  PLT 306 161   Basic Metabolic Panel:  Recent Labs  Lab 12/18/21 1358 12/19/21 0429  NA 135 135  K 4.1 4.2  CL 100 101  CO2 26 27  GLUCOSE 138* 151*  BUN 18 14  CREATININE 0.66 0.60*  CALCIUM 8.8* 8.5*  MG 2.2  --    Lipid Panel:  No results for input(s): CHOL, TRIG, HDL, CHOLHDL, VLDL, LDLCALC in the last 168 hours.   HgbA1c:  No results for input(s): HGBA1C in the last 168 hours.  Urine Drug Screen:  No results for input(s): LABOPIA, COCAINSCRNUR, LABBENZ, AMPHETMU, THCU, LABBARB in the last 168 hours.   Alcohol Level No results for input(s): ETH in the last 168 hours.  IMAGING past 24 hours No results found.  PHYSICAL EXAM  Temp:  [98.5 F (36.9 C)-99.5 F (37.5 C)] 98.7 F (37.1 C) (01/06 1156) Pulse Rate:  [54-69] 58 (01/06 1122) Resp:  [16-22] 18 (01/06 1122) BP: (111-170)/(54-77) 153/68 (01/06 0915) SpO2:  [95 %-100 %] 95 % (01/06 1122) FiO2 (%):  [28 %] 28 %  (01/06 1122)  General - Well nourished, well developed elderly Caucasian male, tracheostomy in place, alert and following commands  Neurological: Awake and alert.  Follows commands well.  Cannot be tested due to tracheostomy but was able to speak earlier with speech therapist with valid.  PERRL, EOMI, no facial droop present, able to move all four extremities in response to commands with good strength.   ASSESSMENT/PLAN Mr. Radames Mejorado is a 76 y.o. male with history of CABG x4, hyperlipidemia, prostate cancer, internal hemorrhoid, and a nonobstructive calculus in right interpole with hematuria presenting with acute onset vertigo nausea vomiting and an inability to walk.  At 10 AM on November 25, 2021 patient developed vertigo nausea and vomiting.  He also noted abdominal discomfort.  A code stroke was called on his arrival to triage.  Once the patient was in the CT scanner he developed more nausea and vomiting he went to a stat MRI which was positive for bilateral cerebellar strokes.  He then developed ataxia in his bilateral upper and lower extremities.  He was then given TNKase and went for a stat CTA of the head.  No LVO was shown on the CTA. Repeat MRI showed progression of  edema related to the cerebellar infarcts with mass effect on the 4th ventricle. Neurosurgery consulted and hypertonic saline initiated. 12/13 suboccipital craniectomy overnight.  Patient tolerated procedure well.  Speech therapy recommends resuming nectar thickened liquids with his diet.  Repeat CT 12/14 shows hemorrhagic transformation of patient's stroke.  Lovenox resumed as he is post 24 hours from hemorrhagic transformation of his stroke.  Patient's agitation has improved with trial of clonazepam, and his neurological exam has improved.  Palliative care has been consulted to aid in [place] discussions.  Repeat head CT shows no additional bleeding. Repeat MRI shows improved brainstem compression. Abnormal restriction signal at  bilateral caudate tails and splenium. Tracheostomy done 12/26. PEG tube done 12/29.  Agitation improved on olanzapine.  Will transfer out of the ICU today.  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 ASPECTS 10.    Repeat CT-extensive swelling in the areas of the acute cerebellar infarction more extensive on the right than the left.  Mass-effect on the fourth ventricle.  No evidence of ventricular obstruction.  Stable lateral and third ventricles. Post craniectomy CT-Unchanged right greater than left cerebellar infarcts.  No new infarction.  Unchanged narrowing of the fourth ventricle.  No evidence of ventricular obstruction or hydrocephalus. Repeat CT 12/14 New hyperdensity in right cerebellar infarct, likely hemorrhage with no increased mass effect. CTA head & neck unremarkable MRI  acute infarct of the cerebellum bilaterally.  negative for hemorrhage.  MRI Repeat 12/12- Interval progression of edema associated to bilateral cerebellar acute infarcts with mass effect on the fourth ventricle and low lying right cerebellar tonsil. No hydrocephalus CT head 12/14 -right cerebellum hemorrhagic transformation without hydrocephalus CT serial repeats - stable postoperative changes involving infarct resolving intraventricular blood.  No acute abnormalities MRI repeat 12/26- stable right cerebellum infarct with hemorrhagic transformation, much improved brainstem compression, fourth ventricle now only minimal compression. abnormal restriction signal at bilateral caudate tails and splenium, concerning for cytotoxic changes 2D Echo EF 70-75% LDL 106 HgbA1c 5.8 VTE prophylaxis - SCDs aspirin 81 mg daily prior to admission, was initially placed on aspirin 81, plavix discontinued due to hemorrhagic transformation of cerebellar stroke.  Switched to Eliquis 12/17/2021  therapy  recommendations: Inpatient rehab-plan for [facility] rehab due to trach cuff  Disposition: Pending  Cerebellar Edema s/p suboccipital decompression  3% normal saline initiated on 12/12 Sodium goal is 150-155 Na 156 S/p suboccipital decompression with Dr. Zada Finders 3% d/c'd and then resumed due to sodium levels -> now off Close neuro monitoring CT 12/13 reveals unchanged infarcts without evidence of hydrocephalus CT head 12/14 - right cerebellum hemorrhagic transformation without hydrocephalus CT head serial repeats - Stable cerebellar infarct with hemorrhage.  No hydrocephalus or interval change Repeat MRI showed improved brainstem compression, abnormal signal at bilateral caudate tails and splenium  Hypertension -> hypotension Home meds: None fluctuates On clonidine 0.1 Q8  BP goal normotensive PRN hydralazine and levophed  Atrial flutter - new diagnosis Cardiology on board As needed metoprolol for rate control Consider DOAC once CT stable - PEG placed 12/13/2021  Fever Leukocytosis Aspiration pneumonia Respiratory distress presumed aspiration pneumonia intubated 12/04/2021,then extubated Reintubated 12/26 due to respiratory distress, aspiration on secretions, tachypnea T-max 101.9-> afebrile->100.8-> 99-> 98.7 WBC 13.3->13.6->10.0->9.9-> 8.2->6.9-> 7.5->8.1-> 8.3-> 6.7->5.6-> 6.0-stabilized CXR 12/13 - Left lower lobe consolidation with small adjacent left pleural effusion. Findings are likely either due to pneumonia or aspiration. CXR 12/26 - Small left-sided  pleural effusion with worsening consolidation at the left base. On unasyn -> rocephin  Hyperlipidemia Home meds: Atorvastatin 80 mg, resumed in hospital LDL 106, goal < 70 Atorvastatin 80 mg daily Continue statin at discharge  Delirium/agitation Encephalopathy Precedex stopped due to bradycardia Fentanyl infusing after intubation Klonopin  0.5mg  QHS Olanzapine ODT 5 mg BID-> change to HS 12/21/2020-> add PRN dose as  well MRI 12/26 concerning for b/l caudate tails/splenium restriction - cytotoxic changes - may be able to explain pt delirium/agitation  Correct metabolic underlying cause Avoid significant electrolyte changes, avoid neurotoxic medications Repeat MRI in 1-2 months at outpt follow up Emphasize sleep hygiene Consider psychiatry consult if not improved  Dysphagia NPO for now MBSS done 12/16 NG placed 12/26- TF infusion PEG placed 12/13/2021 Tube feedings infusing  Other Stroke Risk Factors Advanced Age >/= 65  Obesity, Body mass index is 28.79 kg/m., BMI >/= 30 associated with increased.  Use of oral blood pressure medications.  Risk, recommend weight loss, diet and exercise as appropriate  Coronary artery disease s/p CABG  Other Active Problems Abdominal discomfort CTA abdomen pelvis-no evidence of active GI bleed.  Moderate aortic atherosclerosis.  Diverticulosis with no evidence of diverticulitis. Internal hemorrhoid Nonobstructive calculus in right interpole Hematuria one month ago Urecholine 10 mg 3 times daily  Hospital day # 26  Patient is showing gradual improvement.  We will reduce the dose of Zyprexa and continue ongoing therapies.  Transfer to rehab when bed available.  Long discussion patient and sister-in-law at the bedside and answered questions.  Discussed with Dr. Doristine Bosworth .  Stroke team will sign off.  Follow-up as outpatient stroke clinic in 2 months.  Kindly call for questions greater than 50% time during this 35-minute visit was spent in counseling and coordination of care about his embolic strokes and atrial fibrillation and tracheostomy and answering questions.  Antony Contras, MD Medical Director [facility] Pager: [phone] 12/21/2021 3:36 PM     To contact Stroke Continuity provider, please refer to [email or web address] After hours, contact General Neurology

## 2021-12-21 NOTE — Progress Notes (Signed)
Inpatient Rehabilitation Admissions Coordinator   We await medical readiness to admit to CIR once pulmonary secretions lessened and patient trach able to be downsized. I will follow up next week.  Danne Baxter, RN, MSN Rehab Admissions Coordinator [phone] 12/21/2021 1:12 PM

## 2021-12-21 NOTE — Progress Notes (Signed)
PROGRESS NOTE    [name]  NKN:[id number] DOB: October 13, 1946 DOA: 11/25/2021 PCP: Rogelia Rohrer, MD   Brief Narrative:  76 year old man who presented to [facility] 12/11 with cerebellar stroke. Received TNKase; he developed increasing mass effect on the 4th ventricle and was transferred to the ICU. Suboccipital craniectomy completed 12/13.  Repeat CT 12/14 demonstrated hemorrhagic conversion.   Significant Hospital Events: Including procedures, antibiotic start and stop dates in addition to other pertinent events   12/11 admission, received TNKase 12/13 worsening edema/mass effect, had emergent craniotomy 12/14 hemorrhagic transformation of stroke 12/20 Finished unasyn for aspiration PNA, re intubated 12/22 Extubated 12/23 Palliative consulted 12/26 Re intubated. Tracheostomy performed, Finished ceftriaxone MRI> no new acute ischemia, improvement in brainstem compression, and abnormal signal at bilateral caudate tails and splenium  12/27 trach collar trials 12/28 off vent overnight  12/29 PEG, hypotensive post procedure, back on vent, CT head with no acute hemmorhage 1/1 Back to trach collar 1/3 Tolerating ATC, remains slightly delirious but able to follow commands/participate in care.  Transferred under [place]  Assessment & Plan:   Principal Problem:   Cerebellar stroke [facility]) Active Problems:   Atrial flutter (HCC)   Acute hypercapnic respiratory failure (HCC)   Massive aspiration syndrome   Acute ischemic stroke (HCC)   Acute respiratory failure with hypoxia (HCC)   Endotracheal tube present  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 and compression of ventral pons s/p posterior decompression with craniectomy Hemorrhagic conversion of R cerebellar infarct 12/30 CT Head with no acute hemorrhage - Stroke team following and managing. - ASA discontinued and Eliquis started on 12/17/2021 due to atrial fibrillation. - Repeat MRI in 1-2 months per Neuro, will need outpatient  f/u - Rehab bed pending here at [facility].  They are waiting for his cuff to be downsized to #6 trach   Acute hypoxic respiratory failure, s/p tracheostomy Possible aspiration pneumonia Right lower lobe pneumonia S/p 7 days Unasyn and 7 days of ceftriaxone, Off vent since 10 AM 12/28. Resumed s/p PEG.  - Continues to tolerate ATC for the last > 48H - Pulmonary hygiene - Requiring intermittent NTS via trach, though cough is stronger/secretions improving - DuoNebs PRN - Intermittent CXR to ensure resolution of PNA.  Due to tachypnea, I ordered repeat chest x-ray which shows worsening atelectasis and infiltrates.  However procalcitonin is unremarkable. Management per PCCM.   Dysphagia Modified barium swallow showing aspiration, now s/p PED placed 12/29 - Continues to work with SLP - PMV as able   CAD with chronic HFpEF: Stable.  Hypertension: Very well controlled..  Currently on amlodipine 10 mg, clonidine 0.1 mg 3 times daily and losartan 50 mg, Lopressor 25 mg twice daily.  New onset atrial fibrillation: Started on Eliquis 12/17/2021.  Rate controlled on Lopressor.  HLD: Statin.   Hyperactive delirium, improving - Continue Zyprexa   BPH - Continue urecholine - Trend UOP.  DVT prophylaxis: Place and maintain sequential compression device Start: 12/05/21 0838 SCD's Start: 11/25/21 1246   Code Status: Full Code  Family Communication: Patient's sister-in-law present at bedside.  Plan of care discussed with her in length and he verbalized understanding and agreed with it.  Status is: Inpatient  Remains inpatient appropriate because: Still sick and waiting for inpatient rehab  Estimated body mass index is 28.79 kg/m as calculated from the following:   Height as of this encounter: 5\' 10"  (1.778 m).   Weight as of this encounter: 91 kg.     Nutritional Assessment: Body mass index  is 28.79 kg/m.Marland Kitchen Seen by dietician.  I agree with the assessment and plan as outlined  below: Nutrition Status: Nutrition Problem: Inadequate oral intake Etiology: inability to eat Signs/Symptoms: NPO status Interventions: Tube feeding, Prostat  .  Skin Assessment: I have examined the patient's skin and I agree with the wound assessment as performed by the wound care RN as outlined below:    Consultants:  Neurology PCCM Neurosurgery Cardiology Palliative care  Procedures:  As above  Antimicrobials:  Anti-infectives (From admission, onward)    Start     Dose/Rate Route Frequency Ordered Stop   12/06/21 0000  vancomycin (VANCOREADY) IVPB 1500 mg/300 mL  Status:  Discontinued        1,500 mg 150 mL/hr over 120 Minutes Intravenous Every 24 hours 12/05/21 1105 12/06/21 0711   12/05/21 0000  vancomycin (VANCOREADY) IVPB 1250 mg/250 mL  Status:  Discontinued        1,250 mg 166.7 mL/hr over 90 Minutes Intravenous Every 24 hours 12/04/21 2304 12/05/21 1106   12/04/21 2345  cefTRIAXone (ROCEPHIN) 2 g in sodium chloride 0.9 % 100 mL IVPB        2 g 200 mL/hr over 30 Minutes Intravenous Every 24 hours 12/04/21 2258 12/10/21 2308   11/30/21 0600  vancomycin (VANCOREADY) IVPB 1250 mg/250 mL  Status:  Discontinued        1,250 mg 166.7 mL/hr over 90 Minutes Intravenous Every 24 hours 11/29/21 0425 11/30/21 0909   11/29/21 0515  vancomycin (VANCOREADY) IVPB 750 mg/150 mL  Status:  Discontinued        750 mg 150 mL/hr over 60 Minutes Intravenous Every 8 hours 11/29/21 0415 11/29/21 0420   11/29/21 0515  vancomycin (VANCOREADY) IVPB 2000 mg/400 mL        2,000 mg 200 mL/hr over 120 Minutes Intravenous  Once 11/29/21 0425 11/29/21 0656   11/27/21 0930  Ampicillin-Sulbactam (UNASYN) 3 g in sodium chloride 0.9 % 100 mL IVPB        3 g 200 mL/hr over 30 Minutes Intravenous Every 6 hours 11/27/21 0844 12/03/21 2228          Subjective:  Patient seen and examined.  He is very alert today, the best I have seen him in 3 days.  Giving me very nice smile when asked how he  was feeling.  Coughing nonstop and having some secretions though.  Following commands very well today.  Objective: Vitals:   12/21/21 0818 12/21/21 0915 12/21/21 1122 12/21/21 1156  BP: (!) 146/77 (!) 153/68    Pulse: (!) 58 63 (!) 58   Resp: 16 18 18    Temp: 98.8 F (37.1 C)   98.7 F (37.1 C)  TempSrc: Axillary   Oral  SpO2: 98% 100% 95%   Weight:      Height:        Intake/Output Summary (Last 24 hours) at 12/21/2021 1208 Last data filed at 12/21/2021 0340 Gross per 24 hour  Intake --  Output 600 ml  Net -600 ml    Filed Weights   12/15/21 0500 12/16/21 0500 12/20/21 0500  Weight: 89.8 kg 89.8 kg 91 kg    Examination:  General exam: Appears to have constant cough. Respiratory system: Rhonchi bilaterally. Respiratory effort normal. Trach in place. Cardiovascular system: S1 & S2 heard, irregularly irregular rate and rhythm. No JVD, murmurs, rubs, gallops or clicks. No pedal edema. Gastrointestinal system: Abdomen is nondistended, soft and nontender. No organomegaly or masses felt. Normal bowel sounds heard. Central  nervous system: Alert and oriented. No focal neurological deficits. Extremities: Symmetric 5 x 5 power. Skin: No rashes, lesions or ulcers.   Data Reviewed: I have personally reviewed following labs and imaging studies  CBC: Recent Labs  Lab 12/15/21 0330 12/16/21 0945 12/18/21 1358 12/19/21 0429  WBC 5.6 5.9 9.2 6.0  NEUTROABS  --   --  7.7 3.9  HGB 10.8* 11.7* 13.8 14.7  HCT 32.7* 35.0* 40.5 43.6  MCV 91.6 92.3 90.2 90.3  PLT 273 259 306 242    Basic Metabolic Panel: Recent Labs  Lab 12/15/21 0330 12/16/21 0945 12/18/21 1358 12/19/21 0429  NA 142 139 135 135  K 3.8 3.9 4.1 4.2  CL 108 107 100 101  CO2 26 24 26 27   GLUCOSE 107* 139* 138* 151*  BUN 19 19 18 14   CREATININE 0.74 0.70 0.66 0.60*  CALCIUM 8.1* 8.1* 8.8* 8.5*  MG  --   --  2.2  --     GFR: Estimated Creatinine Clearance: 91.9 mL/min (A) (by C-G formula based on SCr of 0.6  mg/dL (L)). Liver Function Tests: No results for input(s): AST, ALT, ALKPHOS, BILITOT, PROT, ALBUMIN in the last 168 hours. No results for input(s): LIPASE, AMYLASE in the last 168 hours. No results for input(s): AMMONIA in the last 168 hours. Coagulation Profile: No results for input(s): INR, PROTIME in the last 168 hours. Cardiac Enzymes: No results for input(s): CKTOTAL, CKMB, CKMBINDEX, TROPONINI in the last 168 hours. BNP (last 3 results) No results for input(s): PROBNP in the last 8760 hours. HbA1C: No results for input(s): HGBA1C in the last 72 hours. CBG: Recent Labs  Lab 12/20/21 2019 12/20/21 2311 12/21/21 0339 12/21/21 0722 12/21/21 1134  GLUCAP 110* 124* 128* 139* 130*    Lipid Profile: No results for input(s): CHOL, HDL, LDLCALC, TRIG, CHOLHDL, LDLDIRECT in the last 72 hours. Thyroid Function Tests: No results for input(s): TSH, T4TOTAL, FREET4, T3FREE, THYROIDAB in the last 72 hours. Anemia Panel: No results for input(s): VITAMINB12, FOLATE, FERRITIN, TIBC, IRON, RETICCTPCT in the last 72 hours. Sepsis Labs: Recent Labs  Lab 12/18/21 1358  PROCALCITON <0.10     No results found for this or any previous visit (from the past 240 hour(s)).      Radiology Studies: No results found.  Scheduled Meds:  amLODipine  10 mg Per Tube Daily   apixaban  5 mg Per Tube BID   atorvastatin  80 mg Per Tube Daily   bethanechol  10 mg Per Tube TID   chlorhexidine  15 mL Mouth Rinse BID   Chlorhexidine Gluconate Cloth  6 each Topical Daily   clonazepam  0.5 mg Per Tube QHS   cloNIDine  0.1 mg Per Tube Q8H   feeding supplement (PROSource TF)  90 mL Per Tube BID   fiber  1 packet Per Tube BID   free water  250 mL Per Tube Q6H   losartan  50 mg Per Tube Daily   mouth rinse  15 mL Mouth Rinse q12n4p   metoprolol tartrate  25 mg Per Tube BID   OLANZapine zydis  5 mg Sublingual QHS   pantoprazole sodium  40 mg Per Tube Daily   tiZANidine  1 mg Per Tube TID    Continuous Infusions:  sodium chloride 250 mL (12/02/21 1930)   sodium chloride     feeding supplement (JEVITY 1.5 CAL/FIBER) 1,000 mL (12/19/21 1148)     LOS: 26 days   Time spent: 26 MIN  Darliss Cheney, MD [facility]  12/21/2021, 12:08 PM  Please page via [place] and do not message via secure chat for anything urgent. Secure chat can be used for anything non urgent.  How to contact the [facility] Attending or Consulting provider [place] or covering provider during after hours [place], for this patient?  Check the care team in [facility] and look for a) attending/consulting [facility] provider listed and b) the [facility] team listed. Page or secure chat [phone]. Log into [email or web address] and use [place]'s universal password to access. If you do not have the password, please contact the hospital operator. Locate the [facility] provider you are looking for under [facility] and page to a number that you can be directly reached. If you still have difficulty reaching the provider, please page the [facility] (Director on Call) for the Hospitalists listed on amion for assistance.

## 2021-12-22 DIAGNOSIS — I639 Cerebral infarction, unspecified: Secondary | ICD-10-CM | POA: Diagnosis not present

## 2021-12-22 LAB — GLUCOSE, CAPILLARY
Glucose-Capillary: 101 mg/dL — ABNORMAL HIGH (ref 70–99)
Glucose-Capillary: 123 mg/dL — ABNORMAL HIGH (ref 70–99)
Glucose-Capillary: 134 mg/dL — ABNORMAL HIGH (ref 70–99)
Glucose-Capillary: 143 mg/dL — ABNORMAL HIGH (ref 70–99)
Glucose-Capillary: 157 mg/dL — ABNORMAL HIGH (ref 70–99)
Glucose-Capillary: 164 mg/dL — ABNORMAL HIGH (ref 70–99)

## 2021-12-22 LAB — CBC WITH DIFFERENTIAL/PLATELET
Abs Immature Granulocytes: 0.02 10*3/uL (ref 0.00–0.07)
Basophils Absolute: 0.1 10*3/uL (ref 0.0–0.1)
Basophils Relative: 1 %
Eosinophils Absolute: 0.4 10*3/uL (ref 0.0–0.5)
Eosinophils Relative: 8 %
HCT: 37 % — ABNORMAL LOW (ref 39.0–52.0)
Hemoglobin: 12.3 g/dL — ABNORMAL LOW (ref 13.0–17.0)
Immature Granulocytes: 0 %
Lymphocytes Relative: 13 %
Lymphs Abs: 0.8 10*3/uL (ref 0.7–4.0)
MCH: 30.4 pg (ref 26.0–34.0)
MCHC: 33.2 g/dL (ref 30.0–36.0)
MCV: 91.6 fL (ref 80.0–100.0)
Monocytes Absolute: 0.5 10*3/uL (ref 0.1–1.0)
Monocytes Relative: 9 %
Neutro Abs: 4.1 10*3/uL (ref 1.7–7.7)
Neutrophils Relative %: 69 %
Platelets: 237 10*3/uL (ref 150–400)
RBC: 4.04 MIL/uL — ABNORMAL LOW (ref 4.22–5.81)
RDW: 14.6 % (ref 11.5–15.5)
WBC: 5.9 10*3/uL (ref 4.0–10.5)
nRBC: 0 % (ref 0.0–0.2)

## 2021-12-22 LAB — BASIC METABOLIC PANEL
Anion gap: 6 (ref 5–15)
BUN: 15 mg/dL (ref 8–23)
CO2: 27 mmol/L (ref 22–32)
Calcium: 8.5 mg/dL — ABNORMAL LOW (ref 8.9–10.3)
Chloride: 100 mmol/L (ref 98–111)
Creatinine, Ser: 0.65 mg/dL (ref 0.61–1.24)
GFR, Estimated: >60 mL/min (ref >60)
Glucose, Bld: 138 mg/dL — ABNORMAL HIGH (ref 70–99)
Potassium: 4.4 mmol/L (ref 3.5–5.1)
Sodium: 133 mmol/L — ABNORMAL LOW (ref 135–145)

## 2021-12-22 NOTE — Progress Notes (Signed)
PROGRESS NOTE    [name]  MEQ:[id number] DOB: [birth year] DOA: 11/25/2021 PCP: Rogelia Rohrer, MD   Brief Narrative:  76 year old man who presented to [facility] 12/11 with cerebellar stroke. Received TNKase; he developed increasing mass effect on the 4th ventricle and was transferred to the ICU. Suboccipital craniectomy completed 12/13.  Repeat CT 12/14 demonstrated hemorrhagic conversion.   Significant Hospital Events: Including procedures, antibiotic start and stop dates in addition to other pertinent events   12/11 admission, received TNKase 12/13 worsening edema/mass effect, had emergent craniotomy 12/14 hemorrhagic transformation of stroke 12/20 Finished unasyn for aspiration PNA, re intubated 12/22 Extubated 12/23 Palliative consulted 12/26 Re intubated. Tracheostomy performed, Finished ceftriaxone MRI> no new acute ischemia, improvement in brainstem compression, and abnormal signal at bilateral caudate tails and splenium  12/27 trach collar trials 12/28 off vent overnight  12/29 PEG, hypotensive post procedure, back on vent, CT head with no acute hemmorhage 1/1 Back to trach collar 1/3 Tolerating ATC, remains slightly delirious but able to follow commands/participate in care.  Transferred under [place]  Assessment & Plan:   Principal Problem:   Cerebellar stroke [facility]) Active Problems:   Atrial flutter (HCC)   Acute hypercapnic respiratory failure (HCC)   Massive aspiration syndrome   Acute ischemic stroke (HCC)   Acute respiratory failure with hypoxia (HCC)   Endotracheal tube present  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 and compression of ventral pons s/p posterior decompression with craniectomy Hemorrhagic conversion of R cerebellar infarct 12/30 CT Head with no acute hemorrhage - Stroke team following and managing. - ASA discontinued and Eliquis started on 12/17/2021 due to atrial fibrillation. - Repeat MRI in 1-2 months per Neuro, will need outpatient  f/u - Rehab bed pending here at [facility].  They are waiting for his cuff to be downsized to #6 trach   Acute hypoxic respiratory failure, s/p tracheostomy Possible aspiration pneumonia Right lower lobe pneumonia S/p 7 days Unasyn and 7 days of ceftriaxone, Off vent since 10 AM 12/28. Resumed s/p PEG.  - Continues to tolerate ATC for the last > 48H - Pulmonary hygiene - Requiring intermittent NTS via trach, though cough is stronger/secretions improving - DuoNebs PRN - Intermittent CXR to ensure resolution of PNA.  Due to tachypnea, I ordered repeat chest x-ray which shows worsening atelectasis and infiltrates.  However procalcitonin is unremarkable.  Patient had significant secretions yesterday, started him on a scopolamine patch yesterday, significant improvement in secretions today. Management per PCCM.   Dysphagia Modified barium swallow showing aspiration, now s/p PED placed 12/29 - Continues to work with SLP - PMV as able   CAD with chronic HFpEF: Stable.  Hypertension: Very well controlled..  Currently on amlodipine 10 mg, clonidine 0.1 mg 3 times daily and losartan 50 mg, Lopressor 25 mg twice daily.  New onset atrial fibrillation: Started on Eliquis 12/17/2021.  Rate controlled on Lopressor.  HLD: Statin.   Hyperactive delirium, improving - Continue Zyprexa   BPH - Continue urecholine - Trend UOP.  DVT prophylaxis: Place and maintain sequential compression device Start: 12/05/21 0838 SCD's Start: 11/25/21 1246   Code Status: Full Code  Family Communication: Patient's wife present at bedside.  Plan of care discussed with her in length and he verbalized understanding and agreed with it.  Status is: Inpatient  Remains inpatient appropriate because:  waiting for inpatient rehab  Estimated body mass index is 28.06 kg/m as calculated from the following:   Height as of this encounter: 5\' 10"  (1.778 m).  Weight as of this encounter: 88.7 kg.     Nutritional  Assessment: Body mass index is 28.06 kg/m.Marland Kitchen Seen by dietician.  I agree with the assessment and plan as outlined below: Nutrition Status: Nutrition Problem: Inadequate oral intake Etiology: inability to eat Signs/Symptoms: NPO status Interventions: Tube feeding, Prostat  .  Skin Assessment: I have examined the patient's skin and I agree with the wound assessment as performed by the wound care RN as outlined below:    Consultants:  Neurology PCCM Neurosurgery Cardiology Palliative care  Procedures:  As above  Antimicrobials:  Anti-infectives (From admission, onward)    Start     Dose/Rate Route Frequency Ordered Stop   12/06/21 0000  vancomycin (VANCOREADY) IVPB 1500 mg/300 mL  Status:  Discontinued        1,500 mg 150 mL/hr over 120 Minutes Intravenous Every 24 hours 12/05/21 1105 12/06/21 0711   12/05/21 0000  vancomycin (VANCOREADY) IVPB 1250 mg/250 mL  Status:  Discontinued        1,250 mg 166.7 mL/hr over 90 Minutes Intravenous Every 24 hours 12/04/21 2304 12/05/21 1106   12/04/21 2345  cefTRIAXone (ROCEPHIN) 2 g in sodium chloride 0.9 % 100 mL IVPB        2 g 200 mL/hr over 30 Minutes Intravenous Every 24 hours 12/04/21 2258 12/10/21 2308   11/30/21 0600  vancomycin (VANCOREADY) IVPB 1250 mg/250 mL  Status:  Discontinued        1,250 mg 166.7 mL/hr over 90 Minutes Intravenous Every 24 hours 11/29/21 0425 11/30/21 0909   11/29/21 0515  vancomycin (VANCOREADY) IVPB 750 mg/150 mL  Status:  Discontinued        750 mg 150 mL/hr over 60 Minutes Intravenous Every 8 hours 11/29/21 0415 11/29/21 0420   11/29/21 0515  vancomycin (VANCOREADY) IVPB 2000 mg/400 mL        2,000 mg 200 mL/hr over 120 Minutes Intravenous  Once 11/29/21 0425 11/29/21 0656   11/27/21 0930  Ampicillin-Sulbactam (UNASYN) 3 g in sodium chloride 0.9 % 100 mL IVPB        3 g 200 mL/hr over 30 Minutes Intravenous Every 6 hours 11/27/21 0844 12/03/21 2228          Subjective:  Patient seen and  examined.  Very alert and oriented and following commands.  Wife at the bedside.  Patient looks much more comfortable than yesterday.  Objective: Vitals:   12/22/21 0919 12/22/21 0931 12/22/21 1147 12/22/21 1213  BP: (!) 145/68  137/87   Pulse: (!) 59 (!) 57 (!) 56 (!) 55  Resp: 19 18 20  (!) 21  Temp:   98.8 F (37.1 C)   TempSrc:   Oral   SpO2: 100% 99% 100% 100%  Weight:      Height:        Intake/Output Summary (Last 24 hours) at 12/22/2021 1311 Last data filed at 12/22/2021 1151 Gross per 24 hour  Intake 563 ml  Output 2650 ml  Net -2087 ml    Filed Weights   12/16/21 0500 12/20/21 0500 12/22/21 0436  Weight: 89.8 kg 91 kg 88.7 kg    Examination:  General exam: Appears calm and comfortable  Respiratory system: Clear to auscultation. Respiratory effort normal. On trach Cardiovascular system: S1 & S2 heard, irregularly irregular rate and rhythm. No JVD, murmurs, rubs, gallops or clicks. No pedal edema. Gastrointestinal system: Abdomen is nondistended, soft and nontender. No organomegaly or masses felt. Normal bowel sounds heard. Central nervous system: Alert and  oriented. No focal neurological deficits. Extremities: Symmetric 5 x 5 power. Skin: No rashes, lesions or ulcers.  Psychiatry: Judgement and insight appear normal. Mood & affect appropriate.    Data Reviewed: I have personally reviewed following labs and imaging studies  CBC: Recent Labs  Lab 12/16/21 0945 12/18/21 1358 12/19/21 0429 12/22/21 0542  WBC 5.9 9.2 6.0 5.9  NEUTROABS  --  7.7 3.9 4.1  HGB 11.7* 13.8 14.7 12.3*  HCT 35.0* 40.5 43.6 37.0*  MCV 92.3 90.2 90.3 91.6  PLT 259 306 249 440    Basic Metabolic Panel: Recent Labs  Lab 12/16/21 0945 12/18/21 1358 12/19/21 0429 12/22/21 0542  NA 139 135 135 133*  K 3.9 4.1 4.2 4.4  CL 107 100 101 100  CO2 24 26 27 27   GLUCOSE 139* 138* 151* 138*  BUN 19 18 14 15   CREATININE 0.70 0.66 0.60* 0.65  CALCIUM 8.1* 8.8* 8.5* 8.5*  MG  --  2.2  --    --     GFR: Estimated Creatinine Clearance: 90.9 mL/min (by C-G formula based on SCr of 0.65 mg/dL). Liver Function Tests: No results for input(s): AST, ALT, ALKPHOS, BILITOT, PROT, ALBUMIN in the last 168 hours. No results for input(s): LIPASE, AMYLASE in the last 168 hours. No results for input(s): AMMONIA in the last 168 hours. Coagulation Profile: No results for input(s): INR, PROTIME in the last 168 hours. Cardiac Enzymes: No results for input(s): CKTOTAL, CKMB, CKMBINDEX, TROPONINI in the last 168 hours. BNP (last 3 results) No results for input(s): PROBNP in the last 8760 hours. HbA1C: No results for input(s): HGBA1C in the last 72 hours. CBG: Recent Labs  Lab 12/21/21 1933 12/22/21 0016 12/22/21 0411 12/22/21 0803 12/22/21 1148  GLUCAP 115* 164* 143* 157* 101*    Lipid Profile: No results for input(s): CHOL, HDL, LDLCALC, TRIG, CHOLHDL, LDLDIRECT in the last 72 hours. Thyroid Function Tests: No results for input(s): TSH, T4TOTAL, FREET4, T3FREE, THYROIDAB in the last 72 hours. Anemia Panel: No results for input(s): VITAMINB12, FOLATE, FERRITIN, TIBC, IRON, RETICCTPCT in the last 72 hours. Sepsis Labs: Recent Labs  Lab 12/18/21 1358  PROCALCITON <0.10     No results found for this or any previous visit (from the past 240 hour(s)).      Radiology Studies: No results found.  Scheduled Meds:  amLODipine  10 mg Per Tube Daily   apixaban  5 mg Per Tube BID   atorvastatin  80 mg Per Tube Daily   bethanechol  10 mg Per Tube TID   chlorhexidine  15 mL Mouth Rinse BID   Chlorhexidine Gluconate Cloth  6 each Topical Daily   clonazepam  0.5 mg Per Tube QHS   cloNIDine  0.1 mg Per Tube Q8H   feeding supplement (PROSource TF)  90 mL Per Tube BID   fiber  1 packet Per Tube BID   free water  250 mL Per Tube Q6H   losartan  50 mg Per Tube Daily   mouth rinse  15 mL Mouth Rinse q12n4p   metoprolol tartrate  25 mg Per Tube BID   OLANZapine zydis  5 mg  Sublingual QHS   pantoprazole sodium  40 mg Per Tube Daily   scopolamine  1 patch Transdermal Q72H   tiZANidine  1 mg Per Tube TID   Continuous Infusions:  sodium chloride 250 mL (12/02/21 1930)   sodium chloride     feeding supplement (JEVITY 1.5 CAL/FIBER) 1,000 mL (12/22/21 0016)  LOS: 27 days   Time spent: [place], MD [facility]  12/22/2021, 1:11 PM  Please page via [place] and do not message via secure chat for anything urgent. Secure chat can be used for anything non urgent.  How to contact the [facility] Attending or Consulting provider [place] or covering provider during after hours [place], for this patient?  Check the care team in [facility] and look for a) attending/consulting TRH provider listed and b) the [facility] Psychiatric team listed. Page or secure chat [phone]. Log into [email or web address] and use [place]'s universal password to access. If you do not have the password, please contact the hospital operator. Locate the [facility] provider you are looking for under [facility] and page to a number that you can be directly reached. If you still have difficulty reaching the provider, please page the [facility] (Director on Call) for the Hospitalists listed on amion for assistance.

## 2021-12-22 NOTE — Plan of Care (Signed)
Patient continued to pull at lines and tubes while staff in room. Patient confused intermittently during the day. Bilateral wrist restraints remained in place, restraint order renewed by Attending Provider today. Patient with large BM today. Tolerated tube feeds well. No complaints of pain during this shift.   Problem: Education: Goal: Knowledge of General Education information will improve Description: Including pain rating scale, medication(s)/side effects and non-pharmacologic comfort measures Outcome: Progressing   Problem: Clinical Measurements: Goal: Ability to maintain clinical measurements within normal limits will improve Outcome: Progressing  Problem: Nutrition: Goal: Adequate nutrition will be maintained Outcome: Progressing   Problem: Coping: Goal: Level of anxiety will decrease Outcome: Progressing   Problem: Elimination: Goal: Will not experience complications related to bowel motility Outcome: Progressing Goal: Will not experience complications related to urinary retention Outcome: Progressing   Problem: Pain Managment: Goal: General experience of comfort will improve Outcome: Progressing   Problem: Safety: Goal: Ability to remain free from injury will improve Outcome: Progressing   Problem: Skin Integrity: Goal: Risk for impaired skin integrity will decrease Outcome: Progressing   Problem: Education: Goal: Knowledge of disease or condition will improve Outcome: Progressing Problem: Safety: Goal: Non-violent Restraint(s) Outcome: Progressing   Problem: Nutrition: Goal: Risk of aspiration will decrease Outcome: Progressing   Problem: Ischemic Stroke/TIA Tissue Perfusion: Goal: Complications of ischemic stroke/TIA will be minimized Outcome: Progressing

## 2021-12-23 DIAGNOSIS — I639 Cerebral infarction, unspecified: Secondary | ICD-10-CM | POA: Diagnosis not present

## 2021-12-23 LAB — GLUCOSE, CAPILLARY
Glucose-Capillary: 111 mg/dL — ABNORMAL HIGH (ref 70–99)
Glucose-Capillary: 114 mg/dL — ABNORMAL HIGH (ref 70–99)
Glucose-Capillary: 119 mg/dL — ABNORMAL HIGH (ref 70–99)
Glucose-Capillary: 127 mg/dL — ABNORMAL HIGH (ref 70–99)
Glucose-Capillary: 127 mg/dL — ABNORMAL HIGH (ref 70–99)
Glucose-Capillary: 131 mg/dL — ABNORMAL HIGH (ref 70–99)
Glucose-Capillary: 134 mg/dL — ABNORMAL HIGH (ref 70–99)

## 2021-12-23 MED ORDER — METOPROLOL TARTRATE 12.5 MG HALF TABLET
12.5000 mg | ORAL_TABLET | Freq: Two times a day (BID) | ORAL | Status: DC
  Administered 2021-12-23: 12.5 mg
  Filled 2021-12-23 (×3): qty 1

## 2021-12-23 MED ORDER — DICLOFENAC SODIUM 1 % EX GEL
2.0000 g | Freq: Four times a day (QID) | CUTANEOUS | Status: DC
  Administered 2021-12-23 – 2021-12-25 (×7): 2 g via TOPICAL
  Filled 2021-12-23: qty 100

## 2021-12-23 NOTE — Progress Notes (Signed)
Pt had large BM with bright red blood present. Pt with hemorrhoids present and unabel to determine if blood from hemorrhoids or internal bleed. Several small blood clots present.[facility]) notified and no new orders at this time.

## 2021-12-23 NOTE — Progress Notes (Signed)
PROGRESS NOTE    [name]  NID:[id number] DOB: [birth year] DOA: 11/25/2021 PCP: Rogelia Rohrer, MD   Brief Narrative:  76 year old man who presented to [facility] 12/11 with cerebellar stroke. Received TNKase; he developed increasing mass effect on the 4th ventricle and was transferred to the ICU. Suboccipital craniectomy completed 12/13.  Repeat CT 12/14 demonstrated hemorrhagic conversion.   Significant Hospital Events: Including procedures, antibiotic start and stop dates in addition to other pertinent events   12/11 admission, received TNKase 12/13 worsening edema/mass effect, had emergent craniotomy 12/14 hemorrhagic transformation of stroke 12/20 Finished unasyn for aspiration PNA, re intubated 12/22 Extubated 12/23 Palliative consulted 12/26 Re intubated. Tracheostomy performed, Finished ceftriaxone MRI> no new acute ischemia, improvement in brainstem compression, and abnormal signal at bilateral caudate tails and splenium  12/27 trach collar trials 12/28 off vent overnight  12/29 PEG, hypotensive post procedure, back on vent, CT head with no acute hemmorhage 1/1 Back to trach collar 1/3 Tolerating ATC, remains slightly delirious but able to follow commands/participate in care.  Transferred under [place]  Assessment & Plan:   Principal Problem:   Cerebellar stroke [facility]) Active Problems:   Atrial flutter (HCC)   Acute hypercapnic respiratory failure (HCC)   Massive aspiration syndrome   Acute ischemic stroke (HCC)   Acute respiratory failure with hypoxia (HCC)   Endotracheal tube present  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 and compression of ventral pons s/p posterior decompression with craniectomy Hemorrhagic conversion of R cerebellar infarct 12/30 CT Head with no acute hemorrhage - Stroke team following and managing. - ASA discontinued and Eliquis started on 12/17/2021 due to atrial fibrillation. - Repeat MRI in 1-2 months per Neuro, will need outpatient  f/u - Rehab bed pending here at [facility].  They are waiting for his cuff to be downsized to #6 trach which is defer to PCCM   Acute hypoxic respiratory failure, s/p tracheostomy Possible aspiration pneumonia Right lower lobe pneumonia S/p 7 days Unasyn and 7 days of ceftriaxone, Off vent since 10 AM 12/28. Resumed s/p PEG.  - Pulmonary hygiene - Requiring intermittent NTS via trach, though cough is stronger/secretions improving, continue scopolamine. - DuoNebs PRN - Intermittent CXR to ensure resolution of PNA.  Due to tachypnea, I ordered repeat chest x-ray which shows worsening atelectasis and infiltrates.  However procalcitonin is unremarkable. Management per PCCM.   Dysphagia Modified barium swallow showing aspiration, now s/p PED placed 12/29 - Continues to work with SLP - PMV as able   CAD with chronic HFpEF: Stable.  Hypertension: Very well controlled..  Currently on amlodipine 10 mg, clonidine 0.1 mg 3 times daily and losartan 50 mg, but reducing Lopressor to 12.5 mg twice daily.  New onset atrial fibrillation: Started on Eliquis 12/17/2021.  Having some bradycardia with rates around 50, will reduce Lopressor to 12.5 mg twice daily.  HLD: Statin.   Hyperactive delirium, improving - Continue Zyprexa   BPH - Continue urecholine - Trend UOP.  DVT prophylaxis: Place and maintain sequential compression device Start: 12/05/21 0838 SCD's Start: 11/25/21 1246   Code Status: Full Code  Family Communication: Patient's son present at bedside.  Plan of care discussed with her in length and he verbalized understanding and agreed with it.  Status is: Inpatient  Remains inpatient appropriate because:  waiting for inpatient rehab  Estimated body mass index is 27.93 kg/m as calculated from the following:   Height as of this encounter: 5\' 10"  (1.778 m).   Weight as of this encounter: 88.3 kg.  Nutritional Assessment: Body mass index is 27.93 kg/m.Marland Kitchen Seen by dietician.  I agree  with the assessment and plan as outlined below: Nutrition Status: Nutrition Problem: Inadequate oral intake Etiology: inability to eat Signs/Symptoms: NPO status Interventions: Tube feeding, Prostat  .  Skin Assessment: I have examined the patient's skin and I agree with the wound assessment as performed by the wound care RN as outlined below:    Consultants:  Neurology PCCM Neurosurgery Cardiology Palliative care  Procedures:  As above  Antimicrobials:  Anti-infectives (From admission, onward)    Start     Dose/Rate Route Frequency Ordered Stop   12/06/21 0000  vancomycin (VANCOREADY) IVPB 1500 mg/300 mL  Status:  Discontinued        1,500 mg 150 mL/hr over 120 Minutes Intravenous Every 24 hours 12/05/21 1105 12/06/21 0711   12/05/21 0000  vancomycin (VANCOREADY) IVPB 1250 mg/250 mL  Status:  Discontinued        1,250 mg 166.7 mL/hr over 90 Minutes Intravenous Every 24 hours 12/04/21 2304 12/05/21 1106   12/04/21 2345  cefTRIAXone (ROCEPHIN) 2 g in sodium chloride 0.9 % 100 mL IVPB        2 g 200 mL/hr over 30 Minutes Intravenous Every 24 hours 12/04/21 2258 12/10/21 2308   11/30/21 0600  vancomycin (VANCOREADY) IVPB 1250 mg/250 mL  Status:  Discontinued        1,250 mg 166.7 mL/hr over 90 Minutes Intravenous Every 24 hours 11/29/21 0425 11/30/21 0909   11/29/21 0515  vancomycin (VANCOREADY) IVPB 750 mg/150 mL  Status:  Discontinued        750 mg 150 mL/hr over 60 Minutes Intravenous Every 8 hours 11/29/21 0415 11/29/21 0420   11/29/21 0515  vancomycin (VANCOREADY) IVPB 2000 mg/400 mL        2,000 mg 200 mL/hr over 120 Minutes Intravenous  Once 11/29/21 0425 11/29/21 0656   11/27/21 0930  Ampicillin-Sulbactam (UNASYN) 3 g in sodium chloride 0.9 % 100 mL IVPB        3 g 200 mL/hr over 30 Minutes Intravenous Every 6 hours 11/27/21 0844 12/03/21 2228          Subjective:  Patient seen and examined.  Sleepy today.  Son at the bedside.  Patient looks  comfortable.  Objective: Vitals:   12/23/21 0422 12/23/21 0536 12/23/21 0736 12/23/21 0847  BP: 130/79  128/65   Pulse: 62  (!) 56   Resp: 18  (!) 8 16  Temp: 98.5 F (36.9 C)  99 F (37.2 C)   TempSrc: Axillary  Oral   SpO2: 98%  100% 100%  Weight:  88.3 kg    Height:        Intake/Output Summary (Last 24 hours) at 12/23/2021 1032 Last data filed at 12/23/2021 0423 Gross per 24 hour  Intake 500 ml  Output 2700 ml  Net -2200 ml    Filed Weights   12/20/21 0500 12/22/21 0436 12/23/21 0536  Weight: 91 kg 88.7 kg 88.3 kg    Examination:  General exam: Sleepy but comfortable. Respiratory system: Some rhonchi bilaterally. Respiratory effort normal. Cardiovascular system: S1 & S2 heard, irregularly irregular rate and rhythm with bradycardia. No JVD, murmurs, rubs, gallops or clicks. No pedal edema. Gastrointestinal system: Abdomen is nondistended, soft and nontender. No organomegaly or masses felt. Normal bowel sounds heard. Central nervous system: Patient sleepy but arousable.  Data Reviewed: I have personally reviewed following labs and imaging studies  CBC: Recent Labs  Lab 12/18/21 1358  12/19/21 0429 12/22/21 0542  WBC 9.2 6.0 5.9  NEUTROABS 7.7 3.9 4.1  HGB 13.8 14.7 12.3*  HCT 40.5 43.6 37.0*  MCV 90.2 90.3 91.6  PLT 306 249 751    Basic Metabolic Panel: Recent Labs  Lab 12/18/21 1358 12/19/21 0429 12/22/21 0542  NA 135 135 133*  K 4.1 4.2 4.4  CL 100 101 100  CO2 26 27 27   GLUCOSE 138* 151* 138*  BUN 18 14 15   CREATININE 0.66 0.60* 0.65  CALCIUM 8.8* 8.5* 8.5*  MG 2.2  --   --     GFR: Estimated Creatinine Clearance: 90.6 mL/min (by C-G formula based on SCr of 0.65 mg/dL). Liver Function Tests: No results for input(s): AST, ALT, ALKPHOS, BILITOT, PROT, ALBUMIN in the last 168 hours. No results for input(s): LIPASE, AMYLASE in the last 168 hours. No results for input(s): AMMONIA in the last 168 hours. Coagulation Profile: No results for  input(s): INR, PROTIME in the last 168 hours. Cardiac Enzymes: No results for input(s): CKTOTAL, CKMB, CKMBINDEX, TROPONINI in the last 168 hours. BNP (last 3 results) No results for input(s): PROBNP in the last 8760 hours. HbA1C: No results for input(s): HGBA1C in the last 72 hours. CBG: Recent Labs  Lab 12/22/21 1609 12/22/21 2016 12/23/21 0005 12/23/21 0419 12/23/21 0740  GLUCAP 123* 134* 127* 119* 131*    Lipid Profile: No results for input(s): CHOL, HDL, LDLCALC, TRIG, CHOLHDL, LDLDIRECT in the last 72 hours. Thyroid Function Tests: No results for input(s): TSH, T4TOTAL, FREET4, T3FREE, THYROIDAB in the last 72 hours. Anemia Panel: No results for input(s): VITAMINB12, FOLATE, FERRITIN, TIBC, IRON, RETICCTPCT in the last 72 hours. Sepsis Labs: Recent Labs  Lab 12/18/21 1358  PROCALCITON <0.10     No results found for this or any previous visit (from the past 240 hour(s)).      Radiology Studies: No results found.  Scheduled Meds:  amLODipine  10 mg Per Tube Daily   apixaban  5 mg Per Tube BID   atorvastatin  80 mg Per Tube Daily   bethanechol  10 mg Per Tube TID   chlorhexidine  15 mL Mouth Rinse BID   Chlorhexidine Gluconate Cloth  6 each Topical Daily   clonazepam  0.5 mg Per Tube QHS   cloNIDine  0.1 mg Per Tube Q8H   feeding supplement (PROSource TF)  90 mL Per Tube BID   fiber  1 packet Per Tube BID   free water  250 mL Per Tube Q6H   losartan  50 mg Per Tube Daily   mouth rinse  15 mL Mouth Rinse q12n4p   metoprolol tartrate  12.5 mg Per Tube BID   OLANZapine zydis  5 mg Sublingual QHS   pantoprazole sodium  40 mg Per Tube Daily   scopolamine  1 patch Transdermal Q72H   tiZANidine  1 mg Per Tube TID   Continuous Infusions:  sodium chloride 250 mL (12/02/21 1930)   sodium chloride     feeding supplement (JEVITY 1.5 CAL/FIBER) 1,000 mL (12/22/21 0016)     LOS: 28 days   Time spent: [place], MD [facility]  12/23/2021, 10:32 AM  Please page via [place] and do not message via secure chat for anything urgent. Secure chat can be used for anything non urgent.  How to contact the [facility] Attending or Consulting provider [place] or covering provider during after hours [place], for this patient?  Check  the care team in [facility] and look for a) attending/consulting [place] provider listed and b) the [facility] team listed. Page or secure chat [phone]. Log into [email or web address] and use [place]'s universal password to access. If you do not have the password, please contact the hospital operator. Locate the [facility] provider you are looking for under [facility] and page to a number that you can be directly reached. If you still have difficulty reaching the provider, please page the [facility] (Director on Call) for the Hospitalists listed on amion for assistance.

## 2021-12-24 DIAGNOSIS — I639 Cerebral infarction, unspecified: Secondary | ICD-10-CM | POA: Diagnosis not present

## 2021-12-24 LAB — GLUCOSE, CAPILLARY
Glucose-Capillary: 111 mg/dL — ABNORMAL HIGH (ref 70–99)
Glucose-Capillary: 123 mg/dL — ABNORMAL HIGH (ref 70–99)
Glucose-Capillary: 137 mg/dL — ABNORMAL HIGH (ref 70–99)
Glucose-Capillary: 131 mg/dL — ABNORMAL HIGH (ref 70–99)
Glucose-Capillary: 104 mg/dL — ABNORMAL HIGH (ref 70–99)
Glucose-Capillary: 110 mg/dL — ABNORMAL HIGH (ref 70–99)

## 2021-12-24 LAB — CBC
HCT: 39.8 % (ref 39.0–52.0)
Hemoglobin: 13.2 g/dL (ref 13.0–17.0)
MCH: 30.3 pg (ref 26.0–34.0)
MCHC: 33.2 g/dL (ref 30.0–36.0)
MCV: 91.3 fL (ref 80.0–100.0)
Platelets: 220 10*3/uL (ref 150–400)
RBC: 4.36 MIL/uL (ref 4.22–5.81)
RDW: 14.3 % (ref 11.5–15.5)
WBC: 5 10*3/uL (ref 4.0–10.5)
nRBC: 0 % (ref 0.0–0.2)

## 2021-12-24 NOTE — Procedures (Signed)
Tracheostomy Change Note  Patient Details:   Name: [name] DOB: 08-01-1946 MRN: [id number]    Airway Documentation:     Evaluation  O2 sats: stable throughout Complications: No apparent complications Patient did tolerate procedure well. Bilateral Breath Sounds: Diminished, Rhonchi  Patient's trach was downsized to #6 cuffless shiley per CCM orders without any complications with 2 RT assisting. Positive color change noted on CO2 detector. Lurline Idol was secured with trach ties.     Claretta Fraise 12/24/2021, 3:46 PM

## 2021-12-24 NOTE — Progress Notes (Signed)
Inpatient Rehabilitation Admissions Coordinator   I met with patient with his wife and brother in law at bedside. Patient dong well. I await downsize of trach before admitting to CIR.Is PCCM to be consulted?  Danne Baxter, RN, MSN Rehab Admissions Coordinator [phone] 12/24/2021 10:44 AM

## 2021-12-24 NOTE — Progress Notes (Signed)
This chaplain is present to update the Pt. family on the renewal  the [facility] while the Pt. is admitted to [facility].    The chaplain understands the Pt. wife-[name] made the request for a week extension and the request was accepted by [facility] in the [place]. The chaplain communicated the decision to extend the weekly relationship with the Pt. son-Rob, who is at the bedside at the time of the chaplain visit.   This chaplain is available for F/U spiritual care as needed.  Chaplain Sallyanne Kuster [phone]

## 2021-12-24 NOTE — Progress Notes (Signed)
[place] Progress Note Patient Name: [name] DOB: 1946-03-03 MRN: [id number]   Date of Service  12/24/2021  HPI/Events of Note  Patient needs bilateral wrist restraint order renewed for patient safety.  eICU Interventions  Restraints order renewed.        Kerry Kass Rhyse Skowron 12/24/2021, 8:57 PM

## 2021-12-24 NOTE — Progress Notes (Signed)
Occupational Therapy Treatment Patient Details Name: [name] MRN: [id number] DOB: Jul 04, 1946 Today's Date: 12/24/2021   History of present illness 76 yo male presenting to ED on 12/11 with ataxia and vertigo. 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 TNK given at 1214. S/p suboccipital crani for evacuation on 12/13. Required intubation 12/20-12/22. 12/26 trach 12/27 trach trials 12/29 Peg PMH including CAD s/p CABG x4, HLD, OA, nephrolithiasis, urinary retention, BPH with LUTS and prostate cancer.   OT comments  Patient seen as co-treat with PT.  Patient appeared lethargic but was able to arouse to participate with therapy. Patient was mod assist +2 to get to EOB and to transfer into recliner. Patient stood at sink and was able to wash face with mod assist.  Patient stood multiple times before asking to use BSC.  Patient was transferred to [facility] with patient standing at sink and recliner switched out with [employer] and was max assist for hygiene while standing at sink with mod assist +2.  Patient was mod assist +2 to transfer back to bed and was able to get his BLEs and trunk into bed with min guard.  Patient would benefit from further OT services in acute setting.    Recommendations for follow up therapy are one component of a multi-disciplinary discharge planning process, led by the attending physician.  Recommendations may be updated based on patient status, additional functional criteria and insurance authorization.    Follow Up Recommendations  Acute inpatient rehab (3hours/day)    Assistance Recommended at Discharge Frequent or constant Supervision/Assistance  Patient can return home with the following  Two people to help with bathing/dressing/bathroom;Two people to help with walking and/or transfers   Equipment Recommendations  Wheelchair (measurements OT);Wheelchair cushion (measurements OT);Hospital bed;BSC/3in1    Recommendations for Other  Services      Precautions / Restrictions Precautions Precautions: Fall Precaution Comments: trach, peg, primo fit, wrist restraints and mitts bilat       Mobility Bed Mobility Overal bed mobility: Needs Assistance Bed Mobility: Supine to Sit;Sit to Supine     Supine to sit: Min guard     General bed mobility comments: patient was able to get BLEs into bed and trunk for sit to supine    Transfers Overall transfer level: Needs assistance Equipment used: 2 person hand held assist Transfers: Sit to/from Stand;Bed to chair/wheelchair/BSC Sit to Stand: Mod assist;+2 physical assistance;+2 safety/equipment Stand pivot transfers: Mod assist;+2 physical assistance         General transfer comment: increased ability to follow commands for sit to stand     Balance Overall balance assessment: Needs assistance Sitting-balance support: No upper extremity supported;Feet supported Sitting balance-Leahy Scale: Poor Sitting balance - Comments: min assist for sitting balance   Standing balance support: Bilateral upper extremity supported Standing balance-Leahy Scale: Poor Standing balance comment: mod assist x2 to stand at sink with patient performing 8 stands                           ADL either performed or assessed with clinical judgement   ADL Overall ADL's : Needs assistance/impaired     Grooming: Moderate assistance;Standing;Wash/dry face Grooming Details (indicate cue type and reason): washed face while standing at sink with assitance of two for balance                 Toilet Transfer: Moderate assistance;+2 for physical assistance;BSC/3in1 Toilet Transfer Details (indicate cue type  and reason): patient stood at sink with mod assist +2 and recliner was moved and [facility] placed behind patient for toilet transfer [place] and Hygiene: Maximal assistance;Sit to/from stand Toileting - Clothing Manipulation Details (indicate cue type and  reason): patient stood at sink with mod assist +2 for toilet hygiene       General ADL Comments: increased ability to follow commands for sit to stands and grooming    Extremity/Trunk Assessment              Vision       Perception     Praxis      Cognition Arousal/Alertness: Lethargic Behavior During Therapy: Flat affect Overall Cognitive Status: Difficult to assess Area of Impairment: Memory;Following commands                   Current Attention Level: Alternating Memory: Decreased short-term memory Following Commands: Follows one step commands inconsistently;Follows one step commands with increased time Safety/Judgement: Decreased awareness of safety;Decreased awareness of deficits Awareness: Intellectual Problem Solving: Slow processing General Comments: more lethargic this session but followed commands improved          Exercises     Shoulder Instructions       General Comments      Pertinent Vitals/ Pain       Pain Assessment: Faces Faces Pain Scale: No hurt  Home Living                                          Prior Functioning/Environment              Frequency  Min 2X/week        Progress Toward Goals  OT Goals(current goals can now be found in the care plan section)  Progress towards OT goals: Progressing toward goals  Acute Rehab OT Goals OT Goal Formulation: Patient unable to participate in goal setting Time For Goal Achievement: 12/26/21 Potential to Achieve Goals: Good ADL Goals Pt Will Perform Grooming: with set-up;with supervision;sitting Pt Will Perform Lower Body Dressing: with min assist;sit to/from stand Pt Will Transfer to Toilet: with min assist;stand pivot transfer;bedside commode Pt Will Perform Toileting - Clothing Manipulation and hygiene: with min guard assist;sitting/lateral leans Additional ADL Goal #1: Pt will complete bed mobility mod (A) as precursor to adls.  Plan Discharge plan  remains appropriate    Co-evaluation    PT/OT/SLP Co-Evaluation/Treatment: Yes Reason for Co-Treatment: Complexity of the patient's impairments (multi-system involvement);For patient/therapist safety;To address functional/ADL transfers   OT goals addressed during session: ADL's and self-care      AM-PAC OT "6 Clicks" Daily Activity     Outcome Measure   Help from another person eating meals?: A Lot Help from another person taking care of personal grooming?: A Lot Help from another person toileting, which includes using toliet, bedpan, or urinal?: A Lot Help from another person bathing (including washing, rinsing, drying)?: A Lot Help from another person to put on and taking off regular upper body clothing?: A Lot Help from another person to put on and taking off regular lower body clothing?: Total 6 Click Score: 11    End of Session Equipment Utilized During Treatment: Gait belt  OT Visit Diagnosis: Unsteadiness on feet (R26.81);Other abnormalities of gait and mobility (R26.89);Muscle weakness (generalized) (M62.81);History of falling (Z91.81)   Activity Tolerance Patient tolerated treatment well;Patient limited by fatigue  Patient Left in bed;with call bell/phone within reach;with bed alarm set;with family/visitor present   Nurse Communication Mobility status        Time: 1025-1101 OT Time Calculation (min): 36 min  Charges: OT General Charges $OT Visit: 1 Visit OT Treatments $Self Care/Home Management : 8-22 mins  Lodema Hong, [name]  Pager [phone] Office [place] 12/24/2021, 11:21 AM

## 2021-12-24 NOTE — Progress Notes (Signed)
Inpatient Rehabilitation Admissions Coordinator   I contacted Dr Elsworth Soho with PCCM to inquire into plans to downsize his trach. He will follow up today.  Danne Baxter, RN, MSN Rehab Admissions Coordinator [phone] 12/24/2021 1:05 PM

## 2021-12-24 NOTE — Progress Notes (Signed)
PROGRESS NOTE    [name]  DJM:[id number] DOB: November 05, 1946 DOA: 11/25/2021 PCP: Rogelia Rohrer, MD   Brief Narrative:  75 year old man who presented to [facility] 12/11 with cerebellar stroke. Received TNKase; he developed increasing mass effect on the 4th ventricle and was transferred to the ICU. Suboccipital craniectomy completed 12/13.  Repeat CT 12/14 demonstrated hemorrhagic conversion.   Significant Hospital Events: Including procedures, antibiotic start and stop dates in addition to other pertinent events   12/11 admission, received TNKase 12/13 worsening edema/mass effect, had emergent craniotomy 12/14 hemorrhagic transformation of stroke 12/20 Finished unasyn for aspiration PNA, re intubated 12/22 Extubated 12/23 Palliative consulted 12/26 Re intubated. Tracheostomy performed, Finished ceftriaxone MRI> no new acute ischemia, improvement in brainstem compression, and abnormal signal at bilateral caudate tails and splenium  12/27 trach collar trials 12/28 off vent overnight  12/29 PEG, hypotensive post procedure, back on vent, CT head with no acute hemmorhage 1/1 Back to trach collar 1/3 Tolerating ATC, remains slightly delirious but able to follow commands/participate in care.  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 - ASA discontinued and Eliquis started on 12/17/2021 due to atrial fibrillation. - Repeat MRI in 1-2 months per Neuro, will need outpatient f/u - Rehab  bed pending here at [facility]  They are waiting for his cuff to be downsized to #6 trach which is deferred to PCCM   Acute hypoxic respiratory failure, s/p tracheostomy Possible aspiration pneumonia Right lower lobe pneumonia S/p 7 days Unasyn and 7 days of ceftriaxone, Off vent since 10 AM 12/28. Resumed s/p PEG.  - Pulmonary hygiene - Requiring intermittent NTS via trach, though cough is stronger/secretions improving, continue scopolamine. - DuoNebs PRN - Intermittent CXR to ensure resolution of PNA.  Due to tachypnea, I ordered repeat chest x-ray which shows worsening atelectasis and infiltrates.  However procalcitonin is unremarkable. Management per PCCM.   Dysphagia Modified barium swallow showing aspiration, now s/p PED placed 12/29 - Continues to work with SLP - PMV as able   CAD with chronic HFpEF: Stable.  Hypertension: Very well controlled..  Currently on amlodipine 10 mg, clonidine 0.1 mg 3 times daily and losartan 50 mg, but reducing Lopressor to 12.5 mg twice daily.  New onset atrial fibrillation: Started on Eliquis 12/17/2021.  Lopressor reduced yesterday but is still heart rate remains under 60, will try discontinuing that altogether and monitor.  HLD: Statin.   Hyperactive delirium, improving - Continue Zyprexa   BPH - Continue urecholine - Trend UOP.  DVT prophylaxis: Place and maintain sequential compression device Start: 12/05/21 0838 SCD's Start: 11/25/21 1246   Code Status: Full Code  Family Communication: Patient's wife present at bedside.  Plan of care discussed with her in length and he verbalized understanding and agreed with it.  Status is: Inpatient  Remains inpatient appropriate because:  waiting for inpatient rehab  Estimated body mass index is 27.17 kg/m as calculated from the following:   Height as of this encounter: 5\' 10"  (1.778 m).   Weight as of this encounter:  85.9 kg.     Nutritional Assessment: Body mass index is 27.17 kg/m.Marland Kitchen Seen by  dietician.  I agree with the assessment and plan as outlined below: Nutrition Status: Nutrition Problem: Inadequate oral intake Etiology: inability to eat Signs/Symptoms: NPO status Interventions: Tube feeding, Prostat  .  Skin Assessment: I have examined the patient's skin and I agree with the wound assessment as performed by the wound care RN as outlined below:    Consultants:  Neurology PCCM Neurosurgery Cardiology Palliative care  Procedures:  As above  Antimicrobials:  Anti-infectives (From admission, onward)    Start     Dose/Rate Route Frequency Ordered Stop   12/06/21 0000  vancomycin (VANCOREADY) IVPB 1500 mg/300 mL  Status:  Discontinued        1,500 mg 150 mL/hr over 120 Minutes Intravenous Every 24 hours 12/05/21 1105 12/06/21 0711   12/05/21 0000  vancomycin (VANCOREADY) IVPB 1250 mg/250 mL  Status:  Discontinued        1,250 mg 166.7 mL/hr over 90 Minutes Intravenous Every 24 hours 12/04/21 2304 12/05/21 1106   12/04/21 2345  cefTRIAXone (ROCEPHIN) 2 g in sodium chloride 0.9 % 100 mL IVPB        2 g 200 mL/hr over 30 Minutes Intravenous Every 24 hours 12/04/21 2258 12/10/21 2308   11/30/21 0600  vancomycin (VANCOREADY) IVPB 1250 mg/250 mL  Status:  Discontinued        1,250 mg 166.7 mL/hr over 90 Minutes Intravenous Every 24 hours 11/29/21 0425 11/30/21 0909   11/29/21 0515  vancomycin (VANCOREADY) IVPB 750 mg/150 mL  Status:  Discontinued        750 mg 150 mL/hr over 60 Minutes Intravenous Every 8 hours 11/29/21 0415 11/29/21 0420   11/29/21 0515  vancomycin (VANCOREADY) IVPB 2000 mg/400 mL        2,000 mg 200 mL/hr over 120 Minutes Intravenous  Once 11/29/21 0425 11/29/21 0656   11/27/21 0930  Ampicillin-Sulbactam (UNASYN) 3 g in sodium chloride 0.9 % 100 mL IVPB        3 g 200 mL/hr over 30 Minutes Intravenous Every 6 hours 11/27/21 0844 12/03/21 2228          Subjective:  Seen and examined.  Alert, oriented and comfortable with no  complaints.  Objective: Vitals:   12/24/21 0404 12/24/21 0504 12/24/21 0835 12/24/21 0853  BP: (!) 133/56  115/78 115/62  Pulse: (!) 58  (!) 55 (!) 55  Resp: 18  18 (!) 21  Temp: 98 F (36.7 C)  99.1 F (37.3 C)   TempSrc: Axillary  Oral   SpO2: 97%  97% 98%  Weight:  85.9 kg    Height:        Intake/Output Summary (Last 24 hours) at 12/24/2021 1038 Last data filed at 12/24/2021 0248 Gross per 24 hour  Intake --  Output 2550 ml  Net -2550 ml    Filed Weights   12/22/21 0436 12/23/21 0536 12/24/21 0504  Weight: 88.7 kg 88.3 kg 85.9 kg    Examination:  General exam: Appears calm and comfortable  Respiratory system: Clear to auscultation. Respiratory effort normal. Cardiovascular system: S1 & S2 heard, RRR. No JVD, murmurs, rubs, gallops or clicks. No pedal edema. Gastrointestinal system: Abdomen is nondistended, soft and nontender. No organomegaly or masses felt. Normal bowel sounds heard. Central nervous system: Alert and oriented. No focal neurological deficits. Extremities: Symmetric 5 x 5 power.  Data Reviewed: I have personally reviewed following labs and imaging studies  CBC: Recent Labs  Lab 12/18/21 1358 12/19/21 0429 12/22/21 0542 12/24/21 0851  WBC 9.2 6.0 5.9 5.0  NEUTROABS 7.7 3.9 4.1  --   HGB 13.8 14.7 12.3* 13.2  HCT 40.5 43.6 37.0* 39.8  MCV 90.2 90.3 91.6 91.3  PLT 306 249 237 939    Basic Metabolic Panel: Recent Labs  Lab 12/18/21 1358 12/19/21 0429 12/22/21 0542  NA 135 135 133*  K 4.1 4.2 4.4  CL 100 101 100  CO2 26 27 27   GLUCOSE 138* 151* 138*  BUN 18 14 15   CREATININE 0.66 0.60* 0.65  CALCIUM 8.8* 8.5* 8.5*  MG 2.2  --   --     GFR: Estimated Creatinine Clearance: 83.6 mL/min (by C-G formula based on SCr of 0.65 mg/dL). Liver Function Tests: No results for input(s): AST, ALT, ALKPHOS, BILITOT, PROT, ALBUMIN in the last 168 hours. No results for input(s): LIPASE, AMYLASE in the last 168 hours. No results for input(s):  AMMONIA in the last 168 hours. Coagulation Profile: No results for input(s): INR, PROTIME in the last 168 hours. Cardiac Enzymes: No results for input(s): CKTOTAL, CKMB, CKMBINDEX, TROPONINI in the last 168 hours. BNP (last 3 results) No results for input(s): PROBNP in the last 8760 hours. HbA1C: No results for input(s): HGBA1C in the last 72 hours. CBG: Recent Labs  Lab 12/23/21 1616 12/23/21 1958 12/23/21 2353 12/24/21 0401 12/24/21 0809  GLUCAP 134* 114* 127* 111* 123*    Lipid Profile: No results for input(s): CHOL, HDL, LDLCALC, TRIG, CHOLHDL, LDLDIRECT in the last 72 hours. Thyroid Function Tests: No results for input(s): TSH, T4TOTAL, FREET4, T3FREE, THYROIDAB in the last 72 hours. Anemia Panel: No results for input(s): VITAMINB12, FOLATE, FERRITIN, TIBC, IRON, RETICCTPCT in the last 72 hours. Sepsis Labs: Recent Labs  Lab 12/18/21 1358  PROCALCITON <0.10     No results found for this or any previous visit (from the past 240 hour(s)).      Radiology Studies: No results found.  Scheduled Meds:  amLODipine  10 mg Per Tube Daily   apixaban  5 mg Per Tube BID   atorvastatin  80 mg Per Tube Daily   bethanechol  10 mg Per Tube TID   chlorhexidine  15 mL Mouth Rinse BID   Chlorhexidine Gluconate Cloth  6 each Topical Daily   clonazepam  0.5 mg Per Tube QHS   cloNIDine  0.1 mg Per Tube Q8H   diclofenac Sodium  2 g Topical QID   feeding supplement (PROSource TF)  90 mL Per Tube BID   fiber  1 packet Per Tube BID   free water  250 mL Per Tube Q6H   losartan  50 mg Per Tube Daily   mouth rinse  15 mL Mouth Rinse q12n4p   OLANZapine zydis  5 mg Sublingual QHS   pantoprazole sodium  40 mg Per Tube Daily   scopolamine  1 patch Transdermal Q72H   tiZANidine  1 mg Per Tube TID   Continuous Infusions:  sodium chloride 250 mL (12/02/21 1930)   sodium chloride     feeding supplement (JEVITY 1.5 CAL/FIBER) 1,000 mL (12/22/21 0016)     LOS: 29 days   Time  spent: [place], MD [facility]  12/24/2021, 10:38 AM  Please page via Shea Evans and do not message via secure chat for anything urgent. Secure chat can be used for anything non urgent.  How to contact the [facility] Attending or Consulting provider 7A -  7P or covering provider during after hours [place], for this patient?  Check the care team in [facility] and look for a) attending/consulting TRH provider listed and b) the [facility] team listed. Page or secure chat 7A-7P. Log into [email or web address] and use [facility]'s universal password to access. If you do not have the password, please contact the hospital operator. Locate the [facility] provider you are looking for under [facility] and page to a number that you can be directly reached. If you still have difficulty reaching the provider, please page the [facility] (Director on Call) for the Hospitalists listed on amion for assistance.

## 2021-12-24 NOTE — PMR Pre-admission (Signed)
PMR Admission Coordinator Pre-Admission Assessment  Patient: [name] is an 76 y.o., male MRN: [id number] DOB: Jul 28, 1946 Height: _0  (177.8 cm) Weight: 85.9 kg  Insurance Information HMO:     PPO:      PCP:      IPA:      80/20:      OTHER:  PRIMARY: Medicare a and b      Policy#: [id number]      Subscriber: none Benefits:  Phone #: passport one source online     Name: 12/19/21 Eff. Date: a 01/17/2012 and b 07/16/2012     Deduct: $1600      Out of Pocket Max: none      Life Max: none CIR: 100%      SNF: 20 full days Outpatient: 80%     Co-Pay: 20% Home Health: 100%      Co-Pay: none DME: 80%     Co-Pay: 20% Providers: pt choice  SECONDARY: BCBS supplement      Policy#: [id number]  Financial Counselor:       Phone#:   The Data Collection Information Summary for patients in Inpatient Rehabilitation Facilities with attached Privacy Act [place] Records was provided and verbally reviewed with: Patient and Family  Emergency Contact Information Contact Information     Name Relation Home Work Mobile   [place] Spouse   [place] Sister   [phone]      Current Medical History  Patient Admitting Diagnosis: CVA  History of Present Illness:  77 year old right-handed male with history of CAD status post CABG 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   Presented 11/25/2021 with altered mental status as well as dizziness with ataxic gait and diaphoretic.  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  Negative for hemorrhage.  He did receive TNKase.  CT angiogram head and neck negative for intracranial large vessel occlusion.  No significant basilar stenosis or thrombus.  Follow-up MRI showed interval progression of edema associated to bilateral cerebellar acute infarct with mass-effect on the fourth ventricle and low-lying right cerebellar tonsil.  No  hydrocephalus.  EKG showed sinus rhythm.To ECGs and atrial flutter with rapid ventricular rates on the other ECG.  Echocardiogram with ejection fraction of 70 to 75% no wall motion abnormalities grade 1 diastolic dysfunction.  Cardiology services consulted for atrial flutter RVR placed on intravenous Cardizem.  Neurosurgery consulted in regards to cerebellar stroke increased intracranial pressure underwent suboccipital decompressive craniectomy 11/26/2021 per Dr. Venetia Constable.  Patient did remain intubated for prolonged time underwent tracheostomy 12/10/2021 per Dr.Chand As well as PEG tube placement 12/13/2021 per Dr. [name].  Hospital course complicated by aspiration pneumonia/right lower lobe pneumonia completing antibiotic course.  He was later cleared to begin Eliquis for new onset atrial fibrillation 12/17/2021.  Bouts of urinary retention with history of BPH maintained on Urecholine.  He remains n.p.o. with alternative means of nutritional support.  Hyperactive delirium placed on low-dose Zyprexa.  PCCM consulted 12/24/21 with plans to downsize to #6 trach.  Complete NIHSS TOTAL: 4  Patient's medical record from [facility] has been reviewed by the rehabilitation admission coordinator and physician.  Past Medical History  Past Medical History:  Diagnosis Date   CAD (coronary artery disease)    a. s/p CABG in 2012   Hyperlipidemia     Has the patient had major surgery during 100 days prior to admission? Yes  Family History   family history includes  Cancer in his brother; Coronary artery disease in his brother.  Current Medications  Current Facility-Administered Medications:    0.9 %  sodium chloride infusion, 250 mL, Intravenous, Continuous, Chand, Sudham, MD, Last Rate: 10 mL/hr at 12/02/21 1930, 250 mL at 12/02/21 1930   0.9 %  sodium chloride infusion, 250 mL, Intravenous, Continuous, Meier, Hortencia Conradi, MD   acetaminophen (TYLENOL) tablet 650 mg, 650 mg, Oral, Q4H PRN, 650 mg at  12/04/21 1203 **OR** acetaminophen (TYLENOL) 160 MG/5ML solution 650 mg, 650 mg, Per Tube, Q4H PRN, 650 mg at 12/17/21 2258 **OR** acetaminophen (TYLENOL) suppository 650 mg, 650 mg, Rectal, Q4H PRN, Judith Part, MD, 650 mg at 11/29/21 2141   amLODipine (NORVASC) tablet 10 mg, 10 mg, Per Tube, Daily, Rosalin Hawking, MD, 10 mg at 12/25/21 0925   apixaban (ELIQUIS) tablet 5 mg, 5 mg, Per Tube, BID, Pierce, Dwayne A, RPH, 5 mg at 12/25/21 0925   atorvastatin (LIPITOR) tablet 80 mg, 80 mg, Per Tube, Daily, Rosalin Hawking, MD, 80 mg at 12/25/21 0925   bethanechol (URECHOLINE) tablet 10 mg, 10 mg, Per Tube, TID, Rosalin Hawking, MD, 10 mg at 12/25/21 0925   butalbital-acetaminophen-caffeine (FIORICET) 50-325-40 MG per tablet 1 tablet, 1 tablet, Per Tube, Q8H PRN, Rosalin Hawking, MD   chlorhexidine (PERIDEX) 0.12 % solution 15 mL, 15 mL, Mouth Rinse, BID, Rosalin Hawking, MD, 15 mL at 12/25/21 0920   Chlorhexidine Gluconate Cloth 2 % PADS 6 each, 6 each, Topical, Daily, Judith Part, MD, 6 each at 12/24/21 1751   clonazePAM (KLONOPIN) disintegrating tablet 0.5 mg, 0.5 mg, Per Tube, QHS, de Yolanda Manges, Cortney E, NP, 0.5 mg at 12/24/21 2100   cloNIDine (CATAPRES) tablet 0.1 mg, 0.1 mg, Per Tube, Q8H, Maryjane Hurter, MD, 0.1 mg at 12/25/21 0518   diclofenac Sodium (VOLTAREN) 1 % topical gel 2 g, 2 g, Topical, QID, Pahwani, Ravi, MD, 2 g at 12/25/21 0935   docusate (COLACE) 50 MG/5ML liquid 100 mg, 100 mg, Per Tube, BID PRN, Andres Labrum D, PA-C   feeding supplement (JEVITY 1.5 CAL/FIBER) liquid 1,000 mL, 1,000 mL, Per Tube, Continuous, Saverio Danker, PA-C, Last Rate: 55 mL/hr at 12/24/21 1304, 1,000 mL at 12/24/21 1304   feeding supplement (PROSource TF) liquid 90 mL, 90 mL, Per Tube, BID, Simonne Maffucci B, MD, 90 mL at 12/25/21 0926   fiber (NUTRISOURCE FIBER) 1 packet, 1 packet, Per Tube, BID, Maryjane Hurter, MD, 1 packet at 12/25/21 [phone]   free water 250 mL, 250 mL, Per Tube, Q6H, Estill Cotta,  NP, 250 mL at 12/25/21 0522   hydrALAZINE (APRESOLINE) injection 10-20 mg, 10-20 mg, Intravenous, Q4H PRN, Rosalin Hawking, MD, 20 mg at 12/18/21 0809   ipratropium-albuterol (DUONEB) 0.5-2.5 (3) MG/3ML nebulizer solution 3 mL, 3 mL, Nebulization, Q6H PRN, Rosalin Hawking, MD   losartan (COZAAR) tablet 50 mg, 50 mg, Per Tube, Daily, Rosalin Hawking, MD, 50 mg at 12/25/21 5277   MEDLINE mouth rinse, 15 mL, Mouth Rinse, q12n4p, Rosalin Hawking, MD, 15 mL at 12/24/21 1630   melatonin tablet 5 mg, 5 mg, Per Tube, QHS PRN, Jacky Kindle, MD, 5 mg at 12/20/21 0146   metoprolol tartrate (LOPRESSOR) injection 5 mg, 5 mg, Intravenous, Q4H PRN, Judith Part, MD, 5 mg at 11/28/21 0641   OLANZapine zydis (ZYPREXA) disintegrating tablet 5 mg, 5 mg, Sublingual, QHS, Shafer, [name], NP, 5 mg at 12/24/21 2100   OLANZapine zydis (ZYPREXA) disintegrating tablet 5 mg, 5 mg, Oral, Daily PRN, Charlean Merl,  [name], NP   ondansetron [facility]) injection 4 mg, 4 mg, Intravenous, Q6H PRN, Judith Part, MD   oxyCODONE (Oxy IR/ROXICODONE) immediate release tablet 5 mg, 5 mg, Per Tube, Q6H PRN, 5 mg at 12/20/21 0146 **OR** oxyCODONE (Oxy IR/ROXICODONE) immediate release tablet 2.5 mg, 2.5 mg, Per Tube, Q6H PRN, Estill Cotta, NP   pantoprazole sodium (PROTONIX) 40 mg/20 mL oral suspension 40 mg, 40 mg, Per Tube, Daily, Rosalin Hawking, MD, 40 mg at 12/25/21 6948   polyethylene glycol (MIRALAX / GLYCOLAX) packet 17 g, 17 g, Per Tube, Daily PRN, Mick Sell, PA-C   scopolamine (TRANSDERM-SCOP) 1 MG/3DAYS 1.5 mg, 1 patch, Transdermal, Q72H, Pahwani, Ravi, MD, 1.5 mg at 12/24/21 1304   tiZANidine (ZANAFLEX) tablet 1 mg, 1 mg, Per Tube, TID, Maryjane Hurter, MD, 1 mg at 12/25/21 5462  Patients Current Diet:  Diet Order             Diet NPO time specified  Diet effective midnight                 PEG feeds  Precautions / Restrictions Precautions Precautions: Fall Precaution Comments: trach, peg, primo fit, wrist restraints  and mitts bilat Restrictions Weight Bearing Restrictions: No Other Position/Activity Restrictions: Wears a lift in R shoe s/p 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 No  Prior Activity Level Community (5-7x/wk): independent, drives, worked part time at [employer] at [place]: Did the patient need help bathing, dressing, using the toilet or eating? Independent  Indoor Mobility: Did the patient need assistance with walking from room to room (with or without device)? Independent  Stairs: Did the patient need assistance with internal or external stairs (with or without device)? Independent  Functional Cognition: Did the patient need help planning regular tasks such as shopping or remembering to take medications? Independent  Patient Information Are you of Hispanic, Latino/a,or Spanish origin?: A. No, not of Hispanic, Latino/a, or Spanish origin What is your race?: A. White Do you need or want an interpreter to communicate with a doctor or health care staff?: 0. No  Patient's Response To:  Health Literacy and Transportation Is the patient able to respond to health literacy and transportation needs?: Yes Health Literacy - How often do you need to have someone help you when you read instructions, pamphlets, or other written material from your doctor or pharmacy?: Never In the past 12 months, has lack of transportation kept you from medical appointments or from getting medications?: No In the past 12 months, has lack of transportation kept you from meetings, work, or from getting things needed for daily living?: No  Development worker, international aid / [place] Devices/Equipment: None Home Equipment: Standard Walker, Radio producer - single point, Shower seat - built in, [employer] - toilet  Prior Device Use: Indicate devices/aids used by the patient prior to  current illness, exacerbation or injury? None of the above  Current Functional Level Cognition  Arousal/Alertness: Awake/alert Overall Cognitive Status: Difficult to assess Difficult to assess due to: Tracheostomy Current Attention Level: Alternating Orientation Level: Intubated/Tracheostomy - Unable to assess Following Commands: Follows one step commands inconsistently, Follows one step commands with increased time Safety/Judgement: Decreased awareness of safety, Decreased awareness of deficits General Comments: more lethargic this session but followed commands improved Attention: Sustained, Selective Sustained Attention: Appears intact Selective  Attention: Impaired Selective Attention Impairment: Verbal basic, Functional basic Memory: Appears intact Awareness: Impaired Awareness Impairment: Emergent impairment Problem Solving: Impaired Problem Solving Impairment: Verbal basic, Functional basic Executive Function: Reasoning, Self Monitoring, Self Correcting Reasoning: Impaired Reasoning Impairment: Verbal basic, Functional basic Self Monitoring: Impaired Self Monitoring Impairment: Verbal basic, Functional basic Safety/Judgment: Appears intact    Extremity Assessment (includes Sensation/Coordination)  Upper Extremity Assessment: Difficult to assess due to impaired cognition RUE Deficits / Details: R UE weakness noted and decreased coordination. pt completed hand to noses to therapist hand and back on command. pt undershooting iwth task. pt with decreased strength to sustain against gravity RUE Sensation: WNL RUE Coordination: decreased gross motor, decreased fine motor LUE Deficits / Details: WFL AROM shoulder elbow and wrist. pt pulling at all lines/ lead with L. pt pulling at collar of trach LUE Coordination: decreased fine motor, decreased gross motor  Lower Extremity Assessment: Defer to PT evaluation RLE Deficits / Details: grossly 4/5 RLE Coordination: decreased gross  motor (ataxic movement)    ADLs  Overall ADL's : Needs assistance/impaired Eating/Feeding: NPO Eating/Feeding Details (indicate cue type and reason): peg Grooming: Moderate assistance, Standing, Wash/dry face Grooming Details (indicate cue type and reason): washed face while standing at sink with assitance of two for balance Upper Body Bathing: Maximal assistance Lower Body Bathing: Total assistance Upper Body Dressing : Total assistance Lower Body Dressing: Total assistance Toilet Transfer: Moderate assistance, +2 for physical assistance, BSC/3in1 Toilet Transfer Details (indicate cue type and reason): patient stood at sink with mod assist +2 and recliner was moved and BSC placed behind patient for toilet transfer [place] and Hygiene: Maximal assistance, Sit to/from stand Toileting - Clothing Manipulation Details (indicate cue type and reason): patient stood at sink with mod assist +2 for toilet hygiene Functional mobility during ADLs: Moderate assistance, +2 for physical assistance (sit<>stand only) General ADL Comments: increased ability to follow commands for sit to stands and grooming    Mobility  Overal bed mobility: Needs Assistance Bed Mobility: Supine to Sit, Sit to Supine Rolling: Max assist, +2 for physical assistance, +2 for safety/equipment Sidelying to sit: Mod assist, HOB elevated Supine to sit: Min assist, +2 for safety/equipment Sit to supine: Min guard General bed mobility comments: assist with bed pad to scoot forward to edge of bed upon ascenscion to sitting. patient was able to get BLEs into bed and trunk for sit to supine    Transfers  Overall transfer level: Needs assistance Equipment used: 2 person hand held assist Transfers: Sit to/from Stand, Bed to chair/wheelchair/BSC Sit to Stand: Mod assist, +2 physical assistance, +2 safety/equipment Bed to/from chair/wheelchair/BSC transfer type:: Stand pivot Stand pivot transfers: Mod assist,  +2 physical assistance Squat pivot transfers: Max assist, +2 physical assistance, +2 safety/equipment Step pivot transfers: Mod assist, +2 physical assistance Transfer via Lift Equipment: [place] transfer comment: increased ability to follow commands for sit to stand and improved initiation. pt requiring modA + 2 to power up, multimodal cues for bilateral knee extension, cervical extension. able to weight shift R/L without knee buckle. x 8 sit to stands in total    Ambulation / Gait / Stairs / Wheelchair Mobility  Ambulation/Gait Ambulation/Gait assistance:  (deferred due to safety concerns) General Gait Details: unable    Posture / Balance Dynamic Sitting Balance Sitting balance - Comments: min assist for sitting balance Balance Overall balance assessment: Needs assistance Sitting-balance support: No upper extremity supported, Feet supported Sitting balance-Leahy Scale: Poor Sitting balance - Comments: min  assist for sitting balance Postural control: Posterior lean Standing balance support: Bilateral upper extremity supported Standing balance-Leahy Scale: Poor Standing balance comment: mod assist x2 to stand at sink with patient performing 8 stands    Special needs/care consideration 12/10/21 trach; downsized to # 6 uncuffed 1/9 PEG 24 FR LUQ 12/13/2021   Previous Home Environment  Living Arrangements: Spouse/significant other  Lives With: Spouse Available Help at Discharge:  (wife works from home, son from [place] now local) Type of Home: [place]: One level Home Access: Stairs to enter Entrance Stairs-Rails: Right Technical brewer of Steps: 2 Bathroom Shower/Tub: Multimedia programmer: Standard Bathroom Accessibility: Yes How Accessible: Accessible via [place]: No  Discharge [place] for Discharge Living Setting: Patient's home, Lives with (comment) (wife) Type of Home at Discharge: House Discharge Home  Layout: One level Discharge Home Access: Stairs to enter Entrance Stairs-Rails: Right Entrance Stairs-Number of Steps: 2 Discharge Bathroom Shower/Tub: Walk-in shower Discharge Bathroom Toilet: Standard Discharge Bathroom Accessibility: Yes How Accessible: Accessible via walker Does the patient have any problems obtaining your medications?: No  Social/Family/Support Systems Patient Roles: Spouse, 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) Contact Information: wife, [name] Anticipated Caregiver: wife, son and family Anticipated Caregiver's Contact Information: see contacts Ability/Limitations of Caregiver: wife works from home, son form [place] here Administrator, Civil Service Availability: 24/7 Discharge Plan Discussed with Primary Caregiver: Yes Is Caregiver In Agreement with Plan?: Yes Does Caregiver/Family have Issues with Lodging/Transportation while Pt is in Rehab?: No  Goals Patient/Family Goal for Rehab: supervision to min assist with PT, OT and SLP Expected length of stay: ELOS 14 to 20 days Pt/Family Agrees to Admission and willing to participate: Yes Program Orientation Provided & Reviewed with Pt/Caregiver Including Roles  & Responsibilities: Yes Additional Information Needs: Preference was for [facility] due to where they live, but [facility] would not take him with Trach  Decrease burden of Care through IP rehab admission: n/a  Possible need for SNF placement upon discharge: not anticipated  Patient Condition: I have reviewed medical records from [facility], spoken with CM, and patient, spouse, son, and family member. I met with patient at the bedside for inpatient rehabilitation assessment.  Patient will benefit from ongoing PT, OT, and SLP, can actively participate in 3 hours of therapy a day 5 days of the week, and can make measurable gains during the admission.  Patient will also benefit from the coordinated team approach during an Inpatient Acute Rehabilitation admission.  The  patient will receive intensive therapy as well as Rehabilitation physician, nursing, social worker, and care management interventions.  Due to bladder management, bowel management, safety, skin/wound care, disease management, medication administration, pain management, and patient education the patient requires 24 hour a day rehabilitation nursing.  The patient is currently Mod assist overall with mobility and basic ADLs.  Discharge setting and therapy post discharge at home with outpatient is anticipated.  Patient has agreed to participate in the Acute Inpatient Rehabilitation Program and will admit today.  Preadmission Screen Completed By:  Cleatrice Burke, 12/25/2021 10:59 AM ______________________________________________________________________   Discussed status with Dr. Naaman Plummer on  12/25/21 at 1053 and received approval for admission today.  Admission Coordinator:  Cleatrice Burke, RN, time  2025 Date 12/25/2021   Assessment/Plan: Diagnosis: CVA Does the need for close, 24 hr/day Medical supervision in concert with the patient's rehab needs make it unreasonable for this patient to be served in a less intensive  setting? Yes Co-Morbidities requiring supervision/potential complications: CAD/CABG, BPH, prostate cancer Due to bladder management, bowel management, safety, skin/wound care, disease management, medication administration, pain management, and patient education, does the patient require 24 hr/day rehab nursing? Yes Does the patient require coordinated care of a physician, rehab nurse, PT, OT, and SLP to address physical and functional deficits in the context of the above medical diagnosis(es)? Yes Addressing deficits in the following areas: balance, endurance, locomotion, strength, transferring, bowel/bladder control, bathing, dressing, feeding, grooming, toileting, cognition, speech, and psychosocial support Can the patient actively participate in an intensive therapy program  of at least 3 hrs of therapy 5 days a week? Yes The potential for patient to make measurable gains while on inpatient rehab is excellent Anticipated functional outcomes upon discharge from inpatient rehab: supervision and min assist PT, supervision and min assist OT, supervision and min assist SLP Estimated rehab length of stay to reach the above functional goals is: 14-20 days Anticipated discharge destination: Home 10. Overall Rehab/Functional Prognosis: excellent   MD Signature: Meredith Staggers, MD, [place] Director Rehabilitation Services 12/25/2021

## 2021-12-24 NOTE — H&P (Signed)
Physical Medicine and Rehabilitation Admission H&P    Chief Complaint  Patient presents with   Code Stroke  : HPI: [name] is a 76 year old right-handed male with history of CAD status post CABG x4 2012 in [place] maintained on low-dose aspirin, hyperlipidemia, nephrolithiasis, BPH/prostate cancer.  Per chart review patient lives with spouse in [place].  Independent prior to admission working part-time.  Wife can assist as needed.  1 level home with 2 steps to entry.  Presented 11/25/2021 with altered mental status as well as dizziness with ataxic gait and diaphoretic.  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  Negative for hemorrhage.  He did receive TNKase.  CT angiogram head and neck negative for intracranial large vessel occlusion.  No significant basilar stenosis or thrombus.  Follow-up MRI showed interval progression of edema associated to bilateral cerebellar acute infarct with mass-effect on the fourth ventricle and low-lying right cerebellar tonsil.  No hydrocephalus.  EKG showed sinus rhythm To ECGs and atrial flutter with rapid ventricular rates on the other ECG.  Echocardiogram with ejection fraction of 70 to 75% no wall motion abnormalities grade 1 diastolic dysfunction.  Cardiology services consulted for atrial flutter RVR placed on intravenous Cardizem.  Neurosurgery consulted in regards to cerebellar stroke increased intracranial pressure underwent suboccipital decompressive craniectomy 11/26/2021 per Dr. Venetia Constable.  Patient did remain intubated for prolonged time underwent tracheostomy 12/10/2021 per Dr.Chand As well as PEG tube placement 12/13/2021 per Dr. [name].  Hospital course complicated by aspiration pneumonia/right lower lobe pneumonia completing antibiotic course.  Tracheostomy tube was downsized to a #6 cuffless Shiley 12/24/2021.  He was later cleared to begin Eliquis for new onset atrial fibrillation 12/17/2021.  Bouts of urinary  retention with history of BPH maintained on Urecholine.  He remains n.p.o. with alternative means of nutritional support.  Hyperactive delirium placed on low-dose Zyprexa.  Therapy evaluations completed due to patient decreased functional mobility was admitted for a comprehensive rehab program.  Review of Systems  Unable to perform ROS: Acuity of condition  Past Medical History:  Diagnosis Date   CAD (coronary artery disease)    a. s/p CABG in 2012   Hyperlipidemia    Past Surgical History:  Procedure Laterality Date   CORONARY ARTERY BYPASS GRAFT  2012   ESOPHAGOGASTRODUODENOSCOPY N/A 12/13/2021   Procedure: ESOPHAGOGASTRODUODENOSCOPY (EGD);  Surgeon: Jesusita Oka, MD;  Location: [facility] ENDOSCOPY;  Service: General;  Laterality: N/A;   PEG PLACEMENT N/A 12/13/2021   Procedure: PERCUTANEOUS ENDOSCOPIC GASTROSTOMY (PEG) PLACEMENT;  Surgeon: Jesusita Oka, MD;  Location: [facility] ENDOSCOPY;  Service: General;  Laterality: N/A;   SUBOCCIPITAL CRANIECTOMY CERVICAL LAMINECTOMY N/A 11/26/2021   Procedure: SUBOCCIPITAL CRANIECTOMY;  Surgeon: Judith Part, MD;  Location: [place];  Service: Neurosurgery;  Laterality: N/A;   Family History  Problem Relation Age of Onset   Cancer Brother    Coronary artery disease Brother    Social History:  reports that he has quit smoking. His smoking use included cigarettes. He has never used smokeless tobacco. He reports current alcohol use of about 3.0 standard drinks per week. He reports that he does not use drugs. Allergies: No Known Allergies Medications Prior to Admission  Medication Sig Dispense Refill   aspirin EC 81 MG tablet Take 81 mg by mouth daily. Swallow whole.     atorvastatin (LIPITOR) 80 MG tablet Take 80 mg by mouth daily.     HYDROcodone-acetaminophen (NORCO/VICODIN) 5-325 MG tablet Take 1 tablet by mouth daily  as needed for pain.     Omega-3 Fatty Acids (FISH OIL) 1000 MG CAPS Take 2,000 mg by mouth daily.     tamsulosin (FLOMAX) 0.4  MG CAPS capsule Take 0.4 mg by mouth daily.     tiZANidine (ZANAFLEX) 2 MG tablet Take 2 mg by mouth 3 (three) times daily.      Drug Regimen Review Drug regimen was reviewed and remains appropriate with no significant issues identified  Home: Home Living Family/patient expects to be discharged to:: Private residence Living Arrangements: Spouse/significant other Available Help at Discharge: Family, Available 24 hours/day Type of Home: House Home Access: Stairs to enter Technical brewer of Steps: 2 Entrance Stairs-Rails: Right Home Layout: One level Bathroom Shower/Tub: Multimedia programmer: Standard Home Equipment: Standard Environmental consultant, Radio producer - single point, Shower seat - built in, [employer] - toilet  Lives With: Spouse   Functional History: Prior Function Prior Level of Function : Independent/Modified Independent, Working/employed, Driving ADLs Comments: 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  Functional Status:  Mobility: Shafer bed mobility: Needs Assistance Bed Mobility: Supine to Sit, Sit to Supine Rolling: Max assist, +2 for physical assistance, +2 for safety/equipment Sidelying to sit: Mod assist, HOB elevated Supine to sit: Mod assist, +2 for physical assistance, +2 for safety/equipment, HOB elevated Sit to supine: Mod assist, +2 for physical assistance, +2 for safety/equipment General bed mobility comments: patient participated more with sit to supine but continued to require assistance with trunk and BLEs, boost assist for positioning needed Transfers Overall transfer level: Needs assistance Equipment used: 2 person hand held assist Transfers: Sit to/from Stand, Bed to chair/wheelchair/BSC Sit to Stand: Mod assist, Max assist, +2 physical assistance Bed to/from chair/wheelchair/BSC transfer type:: Stand pivot, Squat pivot Stand pivot transfers: Max assist, +2 physical assistance Squat pivot transfers: Max assist, +2 physical  assistance, +2 safety/equipment Step pivot transfers: Mod assist, +2 physical assistance Transfer via Lift Equipment: Stedy General transfer comment: Patient was max assist +2 to transfer from EOB to recliner and max assist +2 to transfer from recliner to EOB due to fatigue. Sit<>stand x6 throughout session, at EOB and sink Ambulation/Gait Ambulation/Gait assistance:  (deferred due to safety concerns) General Gait Details: unable    ADL: ADL Overall ADL's : Needs assistance/impaired Eating/Feeding: NPO Eating/Feeding Details (indicate cue type and reason): peg Grooming: Maximal assistance Grooming Details (indicate cue type and reason): attempted to have patient comb hair and wash face.  Hand over hand attempted with patient being resistant Upper Body Bathing: Maximal assistance Lower Body Bathing: Total assistance Upper Body Dressing : Total assistance Lower Body Dressing: Total assistance Toilet Transfer: Maximal assistance, +2 for physical assistance, Ambulation (two person hand held) Functional mobility during ADLs: Moderate assistance, +2 for physical assistance (sit<>stand only) General ADL Comments: unable to follow commands for grooming  Cognition: Cognition Overall Cognitive Status: Difficult to assess Arousal/Alertness: Awake/alert Orientation Level: Intubated/Tracheostomy - Unable to assess Attention: Sustained, Selective Sustained Attention: Appears intact Selective Attention: Impaired Selective Attention Impairment: Verbal basic, Functional basic Memory: Appears intact Awareness: Impaired Awareness Impairment: Emergent impairment Problem Solving: Impaired Problem Solving Impairment: Verbal basic, Functional basic Executive Function: Reasoning, Self Monitoring, Self Correcting Reasoning: Impaired Reasoning Impairment: Verbal basic, Functional basic Self Monitoring: Impaired Self Monitoring Impairment: Verbal basic, Functional basic Safety/Judgment: Appears  intact Cognition Arousal/Alertness: Awake/alert Behavior During Therapy: Restless, Impulsive (reached for trac and peg tube) Overall Cognitive Status: Difficult to assess Area of Impairment: Memory, Following commands Current Attention Level: Focused Following Commands:  Follows one step commands inconsistently, Follows one step commands with increased time Safety/Judgement: Decreased awareness of safety, Decreased awareness of deficits Awareness: Intellectual Problem Solving: Slow processing General Comments: easily distracted on this date, reached for lines often.  participation and command following decreased with progression of treatment Difficult to assess due to: Tracheostomy  Physical Exam: Blood pressure 115/62, pulse (!) 55, temperature 99.1 F (37.3 C), temperature source Oral, resp. rate (!) 21, height 5\' 10"  (1.778 m), weight 85.9 kg, SpO2 98 %. Physical Exam Constitutional:      General: He is not in acute distress. HENT:     Head:     Comments: Craniotomy site clean and dry Eyes:     Conjunctiva/sclera: Conjunctivae normal.  Neck:     Comments: #6  cufflessTracheostomy tube in place. Clear sputum at end of trach Cardiovascular:     Rate and Rhythm: Normal rate and regular rhythm.     Heart sounds: No murmur heard.   No gallop.  Pulmonary:     Effort: Pulmonary effort is normal.     Comments: Scattered rhonchi and upper airway sounds. Abdominal:     General: There is no distension.     Tenderness: There is no abdominal tenderness. There is no guarding.     Comments: Gastrostomy tube in place, area clean  Musculoskeletal:        General: No swelling or tenderness.  Skin:    General: Skin is warm and dry.  Neurological:     Mental Status: He is alert.     Comments: Patient is lethargic but arousable. Oriented to self.  He does make eye contact with examiner.  Speaks but typically it was non-sensical or unintelligible, dsyarthric. Moves all 4 limbs. Does not  cooperate consistently with MMT. Seems to sense pain in all 4's. No resting tone, dtr's 1+  Psychiatric:     Comments: Restless and distracted when he's awake    Results for orders placed or performed during the hospital encounter of 11/25/21 (from the past 48 hour(s))  Glucose, capillary     Status: Abnormal   Collection Time: 12/22/21 11:48 AM  Result Value Ref Range   Glucose-Capillary 101 (H) 70 - 99 mg/dL    Comment: Glucose reference range applies only to samples taken after fasting for at least 8 hours.  Glucose, capillary     Status: Abnormal   Collection Time: 12/22/21  4:09 PM  Result Value Ref Range   Glucose-Capillary 123 (H) 70 - 99 mg/dL    Comment: Glucose reference range applies only to samples taken after fasting for at least 8 hours.  Glucose, capillary     Status: Abnormal   Collection Time: 12/22/21  8:16 PM  Result Value Ref Range   Glucose-Capillary 134 (H) 70 - 99 mg/dL    Comment: Glucose reference range applies only to samples taken after fasting for at least 8 hours.   Comment 1 Notify RN    Comment 2 Document in Chart   Glucose, capillary     Status: Abnormal   Collection Time: 12/23/21 12:05 AM  Result Value Ref Range   Glucose-Capillary 127 (H) 70 - 99 mg/dL    Comment: Glucose reference range applies only to samples taken after fasting for at least 8 hours.   Comment 1 Notify RN    Comment 2 Document in Chart   Glucose, capillary     Status: Abnormal   Collection Time: 12/23/21  4:19 AM  Result Value Ref Range  Glucose-Capillary 119 (H) 70 - 99 mg/dL    Comment: Glucose reference range applies only to samples taken after fasting for at least 8 hours.  Glucose, capillary     Status: Abnormal   Collection Time: 12/23/21  7:40 AM  Result Value Ref Range   Glucose-Capillary 131 (H) 70 - 99 mg/dL    Comment: Glucose reference range applies only to samples taken after fasting for at least 8 hours.   Comment 1 Notify RN    Comment 2 Document in Chart    Glucose, capillary     Status: Abnormal   Collection Time: 12/23/21 12:31 PM  Result Value Ref Range   Glucose-Capillary 111 (H) 70 - 99 mg/dL    Comment: Glucose reference range applies only to samples taken after fasting for at least 8 hours.  Glucose, capillary     Status: Abnormal   Collection Time: 12/23/21  4:16 PM  Result Value Ref Range   Glucose-Capillary 134 (H) 70 - 99 mg/dL    Comment: Glucose reference range applies only to samples taken after fasting for at least 8 hours.   Comment 1 Notify RN    Comment 2 Document in Chart   Glucose, capillary     Status: Abnormal   Collection Time: 12/23/21  7:58 PM  Result Value Ref Range   Glucose-Capillary 114 (H) 70 - 99 mg/dL    Comment: Glucose reference range applies only to samples taken after fasting for at least 8 hours.  Glucose, capillary     Status: Abnormal   Collection Time: 12/23/21 11:53 PM  Result Value Ref Range   Glucose-Capillary 127 (H) 70 - 99 mg/dL    Comment: Glucose reference range applies only to samples taken after fasting for at least 8 hours.   Comment 1 Notify RN    Comment 2 Document in Chart   Glucose, capillary     Status: Abnormal   Collection Time: 12/24/21  4:01 AM  Result Value Ref Range   Glucose-Capillary 111 (H) 70 - 99 mg/dL    Comment: Glucose reference range applies only to samples taken after fasting for at least 8 hours.   Comment 1 Notify RN    Comment 2 Document in Chart   Glucose, capillary     Status: Abnormal   Collection Time: 12/24/21  8:09 AM  Result Value Ref Range   Glucose-Capillary 123 (H) 70 - 99 mg/dL    Comment: Glucose reference range applies only to samples taken after fasting for at least 8 hours.  CBC     Status: None   Collection Time: 12/24/21  8:51 AM  Result Value Ref Range   WBC 5.0 4.0 - 10.5 K/uL   RBC 4.36 4.22 - 5.81 MIL/uL   Hemoglobin 13.2 13.0 - 17.0 g/dL   HCT 39.8 39.0 - 52.0 %   MCV 91.3 80.0 - 100.0 fL   MCH 30.3 26.0 - 34.0 pg   MCHC 33.2  30.0 - 36.0 g/dL   RDW 14.3 11.5 - 15.5 %   Platelets 220 150 - 400 K/uL   nRBC 0.0 0.0 - 0.2 %    Comment: Performed at [facility], [place] [address], [place], [place] [address]   No results found.    Medical Problem List and Plan: 1. 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  Status post posterior decompression with craniectomy 11/26/2021  -patient may not yet shower  -ELOS/Goals: 18-22  days, supervision to min assist goals 2.  Antithrombotics: -DVT/anticoagulation:  Pharmaceutical: Other (comment) Eliquis  -antiplatelet therapy: N/A 3. Pain Management: Zanaflex 1 mg 3 times daily, Voltaren gel 4 times daily, Fioricet as needed, oxycodone as needed 4. Mood/sleep: Klonopin 0.5 mg nightly, melatonin 5 mg daily as needed  -antipsychotic agents: Zyprexa 5 mg nightly  -begin sleep chart 5. Neuropsych: This patient is not capable of making decisions on his own behalf. 6. Skin/Wound Care: Routine skin checks 7. Fluids/Electrolytes/Nutrition: Routine in and outs with follow-up chemistries  -continue current TF 8.  Prolonged ventilatory support.  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   -Patient downsized to a #6 cuffless Shiley 12/24/2021--tolerating so far -PMV as tolerated, cap soon 9.  Dysphagia.  Status post gastrostomy tube 12/13/2021 per Dr.Lovick. -advance per speech therapy 10.  Aspiration pneumonia.  Antibiotic therapy completed. 11.  New onset atrial fibrillation.  Started Eliquis 12/17/2021.  Follow-up carotid service.  Continue low-dose beta-blocker. 12.  Hypertension.  Norvasc 10 mg daily, Cozaar 50 mg daily, , clonidine 0.1 mg every 8 hours.  Monitor with increased mobility 13.  Hyperlipidemia.  Lipitor 14.  History of BPH/urinary retention.  Urecholine 10 mg 3 times daily 15.  CAD with CABG times 03/2011 in [place].  Follow-up per cardiology services.  Lavon Paganini Angiulli,  PA-C 12/24/2021

## 2021-12-24 NOTE — Progress Notes (Signed)
NAME:  [name], MRN:  [id number], DOB:  1946/01/16, LOS: 25 ADMISSION DATE:  11/25/2021, CONSULTATION DATE:  12/13 REFERRING MD:  Marcelle Overlie, CHIEF COMPLAINT:  Stroke symptoms   History of Present Illness:  76 year old man who presented to [facility] 12/11 with cerebellar stroke. Received TNKase; he developed increasing mass effect on the 4th ventricle and was transferred to the ICU. Suboccipital craniectomy completed 12/13.  Repeat CT 12/14 demonstrated hemorrhagic conversion.  Pertinent Medical History:  CAD s/p CABG  HLD  OA,  Nephrolithiasis BPH  Prostate cancer   Significant Hospital Events: Including procedures, antibiotic start and stop dates in addition to other pertinent events   12/11 admission, received TNKase 12/13 worsening edema/mass effect, had emergent craniotomy 12/14 hemorrhagic transformation of stroke 12/20 Finished unasyn for aspiration PNA, re intubated 12/22 Extubated 12/23 Palliative consulted 12/26 Re intubated. Tracheostomy performed, Finished ceftriaxone MRI> no new acute ischemia, improvement in brainstem compression, and abnormal signal at bilateral caudate tails and splenium  12/27 trach collar trials 12/28 off vent overnight  12/29 PEG, hypotensive post procedure, back on vent, CT head with no acute hemmorhage 1/1 Back to trach collar 1/3 Tolerating ATC, remains slightly delirious but able to follow commands/participate in care. Transfer to progressive pending while awaiting Rehab bed.  Interim History / Subjective:   Remains on 28% trach collar  8.5 cuffed Shiley Good strong cough Afebrile  Objective:  Blood pressure (!) 157/67, pulse 62, temperature 100.1 F (37.8 C), temperature source Axillary, resp. rate 18, height 5\' 10"  (1.778 m), weight 89.5 kg, SpO2 94 %.    FiO2 (%):  [28 %] 28 %   Intake/Output Summary (Last 24 hours) at 12/24/2021 1323 Last data filed at 12/24/2021 0248 Gross per 24 hour  Intake --  Output 1950 ml  Net -1950 ml     Filed Weights   12/22/21 0436 12/23/21 0536 12/24/21 0504  Weight: 88.7 kg 88.3 kg 85.9 kg   Physical Examination: General: Acutely ill-appearing elderly man in NAD. HEENT: [place]/AT, anicteric sclera, PERRL, moist mucous membranes. Tracheostomy status with minimal secretions, good strong cough stoma without surrounding erythema or skin breakdown. Neuro: Follows one-step commands CV: RRR, no m/g/r. PULM: Coarse breath sounds bilateral, no accessory muscle use GI: Soft, nontender, nondistended. Normoactive bowel sounds. Extremities: No LE edema noted.  Mittens for restraints Skin: Warm/dry, no rashes.  Labs show no leukocytosis Chest x-ray 1/3 shows worsening infiltrate at right lung base, improved aeration left  Resolved Hospital Problem List:  Hypernatremia Hypokalemia Hypotension s/p PEG placement  Assessment & Plan:  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 and compression of ventral pons s/p posterior decompression with craniectomy Hemorrhagic conversion of R cerebellar infarct 12/30 CT Head with no acute hemorrhage - - Eliquis (in the setting of Aflutter history) and statin for stroke prevention - ASA discontinued -Plan is to transfer to rehab  Acute hypoxic respiratory failure, s/p tracheostomy Possible aspiration pneumonia Right lower lobe pneumonia S/p 7 days Unasyn and 7 days of ceftriaxone, Off vent since 10 AM 12/28. Resumed s/p PEG.  -Has tolerated trach collar , secretions have decreased, okay to downsize to #6 cuffless - DuoNebs PRN - obtain chest x-ray in a.m. -Not sure he needs scopolamine patch anymore  Dysphagia Modified barium swallow showing aspiration, now s/p PEG placed 12/29 - Continues to work with SLP - PMV as able  Chronic HFpEF Hypertension, uncontrolled CAD Feels well AFlutter History of HLD - Continue Norvasc, Losartan, Clonidine - Hydralazine/Metoprolol PRN while in-house - Continue Eliquis,  statin  Hyperactive  delirium, improving - Continue Zyprexa     Best Practice (right click and "Reselect all SmartList Selections" daily)   Diet/type: NPO w/ meds via tube tube feeds DVT prophylaxis: LMWH GI prophylaxis: PPI Lines: N/A Foley:  N/A Code Status:  full code Last date of multidisciplinary goals of care discussion [12/26 remains full code]  Critical care time: N/A   Yuette Putnam V. Elsworth Soho, MD [facility] 12/24/21 1:23 PM  Please see [email or web address] for pager details.  From 7A-7P if no response, please call [phone] After hours, please call ELink [phone]

## 2021-12-24 NOTE — Progress Notes (Signed)
Physical Therapy Treatment Patient Details Name: [name] MRN: [id number] DOB: [birth year] Today's Date: 12/24/2021   History of Present Illness 76 yo male presenting to ED on 12/11 with ataxia and vertigo. 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 TNK given at 1214. S/p suboccipital crani for evacuation on 12/13. Required intubation 12/20-12/22. 12/26 trach 12/27 trach trials 12/29 Peg PMH including CAD s/p CABG x4, HLD, OA, nephrolithiasis, urinary retention, BPH with LUTS and prostate cancer.    PT Comments    Pt making excellent progress towards his physical therapy goals, exhibiting improved activity tolerance and command following and decreased restlessness. Session focused on bed mobility, transfer and pre gait training. Pt requiring two person moderate assist for transfers. In total, pt tolerated x 8 sit to stands, SpO2 93-94% on RA, HR 55-70's. Can likely initiate gait training next session. Highly recommend AIR to address deficits, maximize functional mobility and decrease caregiver burden.   Recommendations for follow up therapy are one component of a multi-disciplinary discharge planning process, led by the attending physician.  Recommendations may be updated based on patient status, additional functional criteria and insurance authorization.  Follow Up Recommendations  Acute inpatient rehab (3hours/day)     Assistance Recommended at Discharge Frequent or constant Supervision/Assistance  Patient can return home with the following Two people to help with walking and/or transfers;Two people to help with bathing/dressing/bathroom;Direct supervision/assist for medications management;Direct supervision/assist for financial management;Assist for transportation;Help with stairs or ramp for entrance   Equipment Recommendations  Wheelchair (measurements PT);Wheelchair cushion (measurements PT)    Recommendations for Other Services        Precautions / Restrictions Precautions Precautions: Fall Precaution Comments: trach, peg, primo fit, wrist restraints and mitts bilat Restrictions Weight Bearing Restrictions: No     Mobility  Bed Mobility Overal bed mobility: Needs Assistance Bed Mobility: Supine to Sit;Sit to Supine     Supine to sit: Min assist;+2 for safety/equipment Sit to supine: Min guard   General bed mobility comments: assist with bed pad to scoot forward to edge of bed upon ascenscion to sitting. patient was able to get BLEs into bed and trunk for sit to supine    Transfers Overall transfer level: Needs assistance Equipment used: 2 person hand held assist Transfers: Sit to/from Stand;Bed to chair/wheelchair/BSC Sit to Stand: Mod assist;+2 physical assistance;+2 safety/equipment Stand pivot transfers: Mod assist;+2 physical assistance         General transfer comment: increased ability to follow commands for sit to stand and improved initiation. pt requiring modA + 2 to power up, multimodal cues for bilateral knee extension, cervical extension. able to weight shift R/L without knee buckle. x 8 sit to stands in total    Ambulation/Gait                   Stairs             Wheelchair Mobility    Modified Rankin (Stroke Patients Only) Modified Rankin (Stroke Patients Only) Pre-Morbid Rankin Score: No symptoms Modified Rankin: Severe disability     Balance Overall balance assessment: Needs assistance Sitting-balance support: No upper extremity supported;Feet supported Sitting balance-Leahy Scale: Poor Sitting balance - Comments: min assist for sitting balance   Standing balance support: Bilateral upper extremity supported Standing balance-Leahy Scale: Poor Standing balance comment: mod assist x2 to stand at sink with patient performing 8 stands  Cognition Arousal/Alertness: Lethargic Behavior During Therapy: Flat affect Overall  Cognitive Status: Difficult to assess Area of Impairment: Memory;Following commands                   Current Attention Level: Alternating Memory: Decreased short-term memory Following Commands: Follows one step commands inconsistently;Follows one step commands with increased time Safety/Judgement: Decreased awareness of safety;Decreased awareness of deficits Awareness: Intellectual Problem Solving: Slow processing General Comments: more lethargic this session but followed commands improved        Exercises      General Comments        Pertinent Vitals/Pain Pain Assessment: Faces Faces Pain Scale: No hurt    Home Living                          Prior Function            PT Goals (current goals can now be found in the care plan section) Acute Rehab PT Goals Patient Stated Goal: goal to discharge to AIR Potential to Achieve Goals: Good Progress towards PT goals: Progressing toward goals    Frequency    Min 4X/week      PT Plan Current plan remains appropriate    Co-evaluation PT/OT/SLP Co-Evaluation/Treatment: Yes Reason for Co-Treatment: Complexity of the patient's impairments (multi-system involvement);Necessary to address cognition/behavior during functional activity;For patient/therapist safety;To address functional/ADL transfers PT goals addressed during session: Mobility/safety with mobility OT goals addressed during session: ADL's and self-care      AM-PAC PT "6 Clicks" Mobility   Outcome Measure  Help needed turning from your back to your side while in a flat bed without using bedrails?: A Little Help needed moving from lying on your back to sitting on the side of a flat bed without using bedrails?: A Little Help needed moving to and from a bed to a chair (including a wheelchair)?: A Lot Help needed standing up from a chair using your arms (e.g., wheelchair or bedside chair)?: A Lot Help needed to walk in hospital room?:  Total Help needed climbing 3-5 steps with a railing? : Total 6 Click Score: 12    End of Session Equipment Utilized During Treatment: Gait belt Activity Tolerance: Patient tolerated treatment well Patient left: in bed;with call bell/phone within reach;with bed alarm set;with restraints reapplied Nurse Communication: Mobility status PT Visit Diagnosis: Unsteadiness on feet (R26.81);Difficulty in walking, not elsewhere classified (R26.2);Ataxic gait (R26.0)     Time: 1025-1101 PT Time Calculation (min) (ACUTE ONLY): 36 min  Charges:  $Therapeutic Activity: 8-22 mins                     Wyona Almas, PT, DPT Acute Rehabilitation Services Pager [phone] Office [phone]    Deno Etienne 12/24/2021, 1:20 PM

## 2021-12-25 ENCOUNTER — Inpatient Hospital Stay (HOSPITAL_COMMUNITY)
Admission: RE | Admit: 2021-12-25 | Discharge: 2022-02-08 | DRG: 056 | Disposition: A | Discharge status: Home Health | Payer: Medicare Other | Source: Intra-hospital | Attending: Physical Medicine & Rehabilitation | Admitting: Physical Medicine & Rehabilitation

## 2021-12-25 ENCOUNTER — Inpatient Hospital Stay (HOSPITAL_COMMUNITY): Payer: Medicare Other

## 2021-12-25 ENCOUNTER — Encounter (HOSPITAL_COMMUNITY): Payer: Self-pay | Admitting: Physical Medicine & Rehabilitation

## 2021-12-25 ENCOUNTER — Other Ambulatory Visit: Payer: Self-pay

## 2021-12-25 DIAGNOSIS — R310 Gross hematuria: Secondary | ICD-10-CM | POA: Diagnosis not present

## 2021-12-25 DIAGNOSIS — I4892 Unspecified atrial flutter: Secondary | ICD-10-CM | POA: Diagnosis present

## 2021-12-25 DIAGNOSIS — Y842 Radiological procedure and radiotherapy as the cause of abnormal reaction of the patient, or of later complication, without mention of misadventure at the time of the procedure: Secondary | ICD-10-CM | POA: Diagnosis present

## 2021-12-25 DIAGNOSIS — Z951 Presence of aortocoronary bypass graft: Secondary | ICD-10-CM

## 2021-12-25 DIAGNOSIS — I340 Nonrheumatic mitral (valve) insufficiency: Secondary | ICD-10-CM | POA: Diagnosis present

## 2021-12-25 DIAGNOSIS — I2510 Atherosclerotic heart disease of native coronary artery without angina pectoris: Secondary | ICD-10-CM | POA: Diagnosis present

## 2021-12-25 DIAGNOSIS — R451 Restlessness and agitation: Secondary | ICD-10-CM | POA: Diagnosis not present

## 2021-12-25 DIAGNOSIS — Z7982 Long term (current) use of aspirin: Secondary | ICD-10-CM | POA: Diagnosis not present

## 2021-12-25 DIAGNOSIS — Z79899 Other long term (current) drug therapy: Secondary | ICD-10-CM

## 2021-12-25 DIAGNOSIS — Z930 Tracheostomy status: Secondary | ICD-10-CM | POA: Diagnosis not present

## 2021-12-25 DIAGNOSIS — N029 Recurrent and persistent hematuria with unspecified morphologic changes: Secondary | ICD-10-CM | POA: Diagnosis present

## 2021-12-25 DIAGNOSIS — R319 Hematuria, unspecified: Secondary | ICD-10-CM | POA: Diagnosis not present

## 2021-12-25 DIAGNOSIS — K921 Melena: Secondary | ICD-10-CM | POA: Diagnosis not present

## 2021-12-25 DIAGNOSIS — W19XXXA Unspecified fall, initial encounter: Secondary | ICD-10-CM | POA: Diagnosis not present

## 2021-12-25 DIAGNOSIS — I69393 Ataxia following cerebral infarction: Secondary | ICD-10-CM | POA: Diagnosis present

## 2021-12-25 DIAGNOSIS — Z7901 Long term (current) use of anticoagulants: Secondary | ICD-10-CM

## 2021-12-25 DIAGNOSIS — I480 Paroxysmal atrial fibrillation: Secondary | ICD-10-CM | POA: Diagnosis present

## 2021-12-25 DIAGNOSIS — Z931 Gastrostomy status: Secondary | ICD-10-CM

## 2021-12-25 DIAGNOSIS — F05 Delirium due to known physiological condition: Secondary | ICD-10-CM | POA: Diagnosis not present

## 2021-12-25 DIAGNOSIS — G936 Cerebral edema: Secondary | ICD-10-CM | POA: Diagnosis present

## 2021-12-25 DIAGNOSIS — Z9079 Acquired absence of other genital organ(s): Secondary | ICD-10-CM

## 2021-12-25 DIAGNOSIS — R339 Retention of urine, unspecified: Secondary | ICD-10-CM | POA: Diagnosis present

## 2021-12-25 DIAGNOSIS — Z87891 Personal history of nicotine dependence: Secondary | ICD-10-CM

## 2021-12-25 DIAGNOSIS — I69351 Hemiplegia and hemiparesis following cerebral infarction affecting right dominant side: Secondary | ICD-10-CM

## 2021-12-25 DIAGNOSIS — R338 Other retention of urine: Secondary | ICD-10-CM | POA: Diagnosis present

## 2021-12-25 DIAGNOSIS — R1313 Dysphagia, pharyngeal phase: Secondary | ICD-10-CM | POA: Diagnosis present

## 2021-12-25 DIAGNOSIS — Z9181 History of falling: Secondary | ICD-10-CM

## 2021-12-25 DIAGNOSIS — R0902 Hypoxemia: Secondary | ICD-10-CM | POA: Diagnosis not present

## 2021-12-25 DIAGNOSIS — I69391 Dysphagia following cerebral infarction: Secondary | ICD-10-CM

## 2021-12-25 DIAGNOSIS — B9620 Unspecified Escherichia coli [E. coli] as the cause of diseases classified elsewhere: Secondary | ICD-10-CM | POA: Diagnosis not present

## 2021-12-25 DIAGNOSIS — I69322 Dysarthria following cerebral infarction: Secondary | ICD-10-CM | POA: Diagnosis not present

## 2021-12-25 DIAGNOSIS — Z87440 Personal history of urinary (tract) infections: Secondary | ICD-10-CM

## 2021-12-25 DIAGNOSIS — N401 Enlarged prostate with lower urinary tract symptoms: Secondary | ICD-10-CM | POA: Diagnosis present

## 2021-12-25 DIAGNOSIS — I639 Cerebral infarction, unspecified: Secondary | ICD-10-CM | POA: Diagnosis present

## 2021-12-25 DIAGNOSIS — I10 Essential (primary) hypertension: Secondary | ICD-10-CM | POA: Diagnosis present

## 2021-12-25 DIAGNOSIS — Z87442 Personal history of urinary calculi: Secondary | ICD-10-CM

## 2021-12-25 DIAGNOSIS — I959 Hypotension, unspecified: Secondary | ICD-10-CM | POA: Diagnosis not present

## 2021-12-25 DIAGNOSIS — Z8546 Personal history of malignant neoplasm of prostate: Secondary | ICD-10-CM

## 2021-12-25 DIAGNOSIS — E785 Hyperlipidemia, unspecified: Secondary | ICD-10-CM | POA: Diagnosis present

## 2021-12-25 DIAGNOSIS — Z8249 Family history of ischemic heart disease and other diseases of the circulatory system: Secondary | ICD-10-CM

## 2021-12-25 DIAGNOSIS — Z781 Physical restraint status: Secondary | ICD-10-CM

## 2021-12-25 DIAGNOSIS — Z923 Personal history of irradiation: Secondary | ICD-10-CM

## 2021-12-25 DIAGNOSIS — N3040 Irradiation cystitis without hematuria: Secondary | ICD-10-CM | POA: Diagnosis not present

## 2021-12-25 DIAGNOSIS — I4891 Unspecified atrial fibrillation: Secondary | ICD-10-CM

## 2021-12-25 LAB — BASIC METABOLIC PANEL
Anion gap: 7 (ref 5–15)
BUN: 19 mg/dL (ref 8–23)
CO2: 32 mmol/L (ref 22–32)
Calcium: 8.6 mg/dL — ABNORMAL LOW (ref 8.9–10.3)
Chloride: 97 mmol/L — ABNORMAL LOW (ref 98–111)
Creatinine, Ser: 0.82 mg/dL (ref 0.61–1.24)
GFR, Estimated: >60 mL/min (ref >60)
Glucose, Bld: 124 mg/dL — ABNORMAL HIGH (ref 70–99)
Potassium: 4.1 mmol/L (ref 3.5–5.1)
Sodium: 136 mmol/L (ref 135–145)

## 2021-12-25 LAB — CBC WITH DIFFERENTIAL/PLATELET
Abs Immature Granulocytes: 0.03 10*3/uL (ref 0.00–0.07)
Basophils Absolute: 0 10*3/uL (ref 0.0–0.1)
Basophils Relative: 1 %
Eosinophils Absolute: 0.4 10*3/uL (ref 0.0–0.5)
Eosinophils Relative: 8 %
HCT: 40.5 % (ref 39.0–52.0)
Hemoglobin: 13.4 g/dL (ref 13.0–17.0)
Immature Granulocytes: 1 %
Lymphocytes Relative: 19 %
Lymphs Abs: 0.9 10*3/uL (ref 0.7–4.0)
MCH: 30.6 pg (ref 26.0–34.0)
MCHC: 33.1 g/dL (ref 30.0–36.0)
MCV: 92.5 fL (ref 80.0–100.0)
Monocytes Absolute: 0.5 10*3/uL (ref 0.1–1.0)
Monocytes Relative: 11 %
Neutro Abs: 2.9 10*3/uL (ref 1.7–7.7)
Neutrophils Relative %: 60 %
Platelets: 240 10*3/uL (ref 150–400)
RBC: 4.38 MIL/uL (ref 4.22–5.81)
RDW: 14.4 % (ref 11.5–15.5)
WBC: 4.7 10*3/uL (ref 4.0–10.5)
nRBC: 0 % (ref 0.0–0.2)

## 2021-12-25 LAB — GLUCOSE, CAPILLARY
Glucose-Capillary: 108 mg/dL — ABNORMAL HIGH (ref 70–99)
Glucose-Capillary: 114 mg/dL — ABNORMAL HIGH (ref 70–99)
Glucose-Capillary: 120 mg/dL — ABNORMAL HIGH (ref 70–99)
Glucose-Capillary: 130 mg/dL — ABNORMAL HIGH (ref 70–99)
Glucose-Capillary: 133 mg/dL — ABNORMAL HIGH (ref 70–99)
Glucose-Capillary: 139 mg/dL — ABNORMAL HIGH (ref 70–99)

## 2021-12-25 IMAGING — DX DG CHEST 1V PORT
1 series · 1 of 1 positions shown · non-contrast
Comparison: Previous studies including the examination of
[DATE]

CLINICAL DATA: Difficulty breathing

EXAM:
PORTABLE CHEST 1 VIEW

[chest]
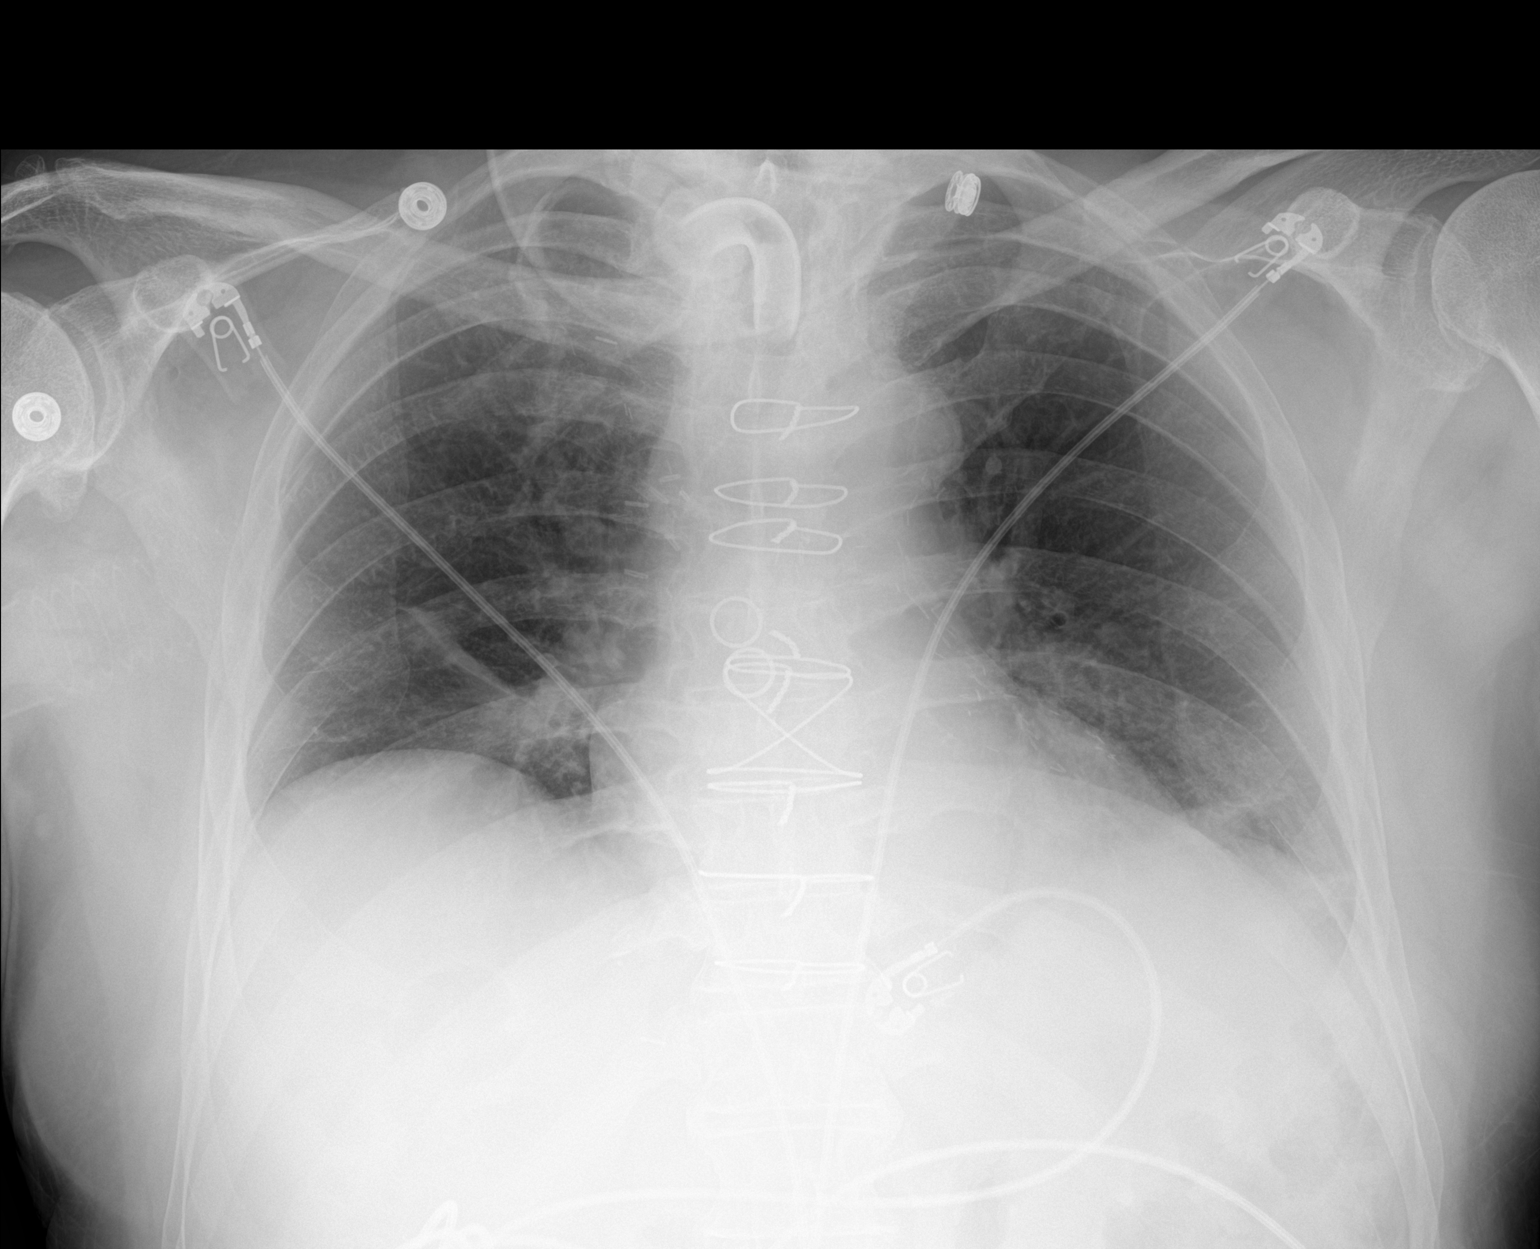

[1 of 1 positions shown; findings below may reference images not displayed]

FINDINGS: Transverse diameter of heart is increased. Central pulmonary vessels
are less prominent. Tip of tracheostomy is proximally 5.4 cm above
the carina. There is evidence of previous coronary bypass surgery.
There is interval improvement in aeration of both lower lung fields.
There are small linear residual densities in the lower lung fields
suggesting subsegmental atelectasis, more so on the left side. Left
lateral CP angle is indistinct. There is no pneumothorax.
IMPRESSION: Central pulmonary vessels are less prominent. There are no signs of
alveolar pulmonary edema. There is improvement in aeration of both
lower lung fields. Residual linear densities in the lower lung
fields suggest subsegmental atelectasis. Possible small left pleural
effusion.

## 2021-12-25 MED ORDER — OLANZAPINE 5 MG PO TBDP: 5.0000 mg | ORAL_TABLET | Freq: Every day | ORAL | 0 refills | Status: DC

## 2021-12-25 MED ORDER — TIZANIDINE HCL 2 MG PO TABS
1.0000 mg | ORAL_TABLET | Freq: Three times a day (TID) | ORAL | Status: DC
  Administered 2021-12-25 – 2021-12-30 (×15): 1 mg
  Filled 2021-12-25 (×15): qty 1

## 2021-12-25 MED ORDER — CLONIDINE HCL 0.1 MG PO TABS: 0.1000 mg | ORAL_TABLET | Freq: Three times a day (TID) | ORAL | 0 refills | Status: DC

## 2021-12-25 MED ORDER — POLYETHYLENE GLYCOL 3350 17 G PO PACK
17.0000 g | PACK | Freq: Every day | ORAL | Status: DC | PRN
  Administered 2022-01-08: 09:00:00 17 g

## 2021-12-25 MED ORDER — OLANZAPINE 5 MG PO TBDP
5.0000 mg | ORAL_TABLET | Freq: Every day | ORAL | Status: DC
  Administered 2021-12-25: 5 mg via SUBLINGUAL
  Filled 2021-12-25: qty 1

## 2021-12-25 MED ORDER — AMLODIPINE BESYLATE 10 MG PO TABS
10.0000 mg | ORAL_TABLET | Freq: Every day | ORAL | Status: DC
  Administered 2021-12-26 – 2022-01-20 (×26): 10 mg
  Filled 2021-12-25 (×26): qty 1

## 2021-12-25 MED ORDER — PROSOURCE TF PO LIQD
90.0000 mL | Freq: Two times a day (BID) | ORAL | Status: DC
  Administered 2021-12-25 – 2022-01-03 (×18): 90 mL
  Filled 2021-12-25 (×18): qty 90

## 2021-12-25 MED ORDER — ACETAMINOPHEN 650 MG RE SUPP: 650.0000 mg | RECTAL | Status: DC | PRN

## 2021-12-25 MED ORDER — SCOPOLAMINE 1 MG/3DAYS TD PT72: 1.0000 | MEDICATED_PATCH | TRANSDERMAL | 0 refills | Status: DC

## 2021-12-25 MED ORDER — BETHANECHOL CHLORIDE 10 MG PO TABS
10.0000 mg | ORAL_TABLET | Freq: Three times a day (TID) | ORAL | Status: DC
  Administered 2021-12-25 – 2021-12-28 (×9): 10 mg
  Filled 2021-12-25 (×9): qty 1

## 2021-12-25 MED ORDER — OLANZAPINE 5 MG PO TBDP
5.0000 mg | ORAL_TABLET | Freq: Every day | ORAL | Status: DC | PRN
  Filled 2021-12-25: qty 1

## 2021-12-25 MED ORDER — APIXABAN 5 MG PO TABS: 5.0000 mg | ORAL_TABLET | Freq: Two times a day (BID) | ORAL | 0 refills | Status: DC

## 2021-12-25 MED ORDER — CLONIDINE HCL 0.1 MG PO TABS
0.1000 mg | ORAL_TABLET | Freq: Three times a day (TID) | ORAL | Status: DC
Start: 2021-12-25 — End: 2022-01-14
  Administered 2021-12-25 – 2022-01-14 (×48): 0.1 mg
  Filled 2021-12-25 (×58): qty 1

## 2021-12-25 MED ORDER — CLONAZEPAM 0.25 MG PO TBDP
0.5000 mg | ORAL_TABLET | Freq: Every day | ORAL | Status: DC
Start: 2021-12-25 — End: 2021-12-27
  Administered 2021-12-25 – 2021-12-26 (×2): 0.5 mg
  Filled 2021-12-25 (×2): qty 2

## 2021-12-25 MED ORDER — IPRATROPIUM-ALBUTEROL 0.5-2.5 (3) MG/3ML IN SOLN: 3.0000 mL | Freq: Four times a day (QID) | RESPIRATORY_TRACT | Status: DC | PRN

## 2021-12-25 MED ORDER — LOSARTAN POTASSIUM 50 MG PO TABS: 50.0000 mg | ORAL_TABLET | Freq: Every day | ORAL | Status: DC

## 2021-12-25 MED ORDER — DOCUSATE SODIUM 50 MG/5ML PO LIQD
100.0000 mg | Freq: Two times a day (BID) | ORAL | Status: DC | PRN
  Administered 2022-01-09: 21:00:00 100 mg
  Filled 2021-12-25: qty 10

## 2021-12-25 MED ORDER — DICLOFENAC SODIUM 1 % EX GEL
2.0000 g | Freq: Four times a day (QID) | CUTANEOUS | Status: DC
  Administered 2021-12-25 – 2022-02-08 (×136): 2 g via TOPICAL
  Filled 2021-12-25 (×2): qty 100

## 2021-12-25 MED ORDER — OXYCODONE HCL 5 MG PO TABS
5.0000 mg | ORAL_TABLET | Freq: Four times a day (QID) | ORAL | Status: DC | PRN
  Administered 2021-12-30 – 2021-12-31 (×4): 5 mg
  Filled 2021-12-25 (×4): qty 1

## 2021-12-25 MED ORDER — ACETAMINOPHEN 325 MG PO TABS
650.0000 mg | ORAL_TABLET | ORAL | Status: DC | PRN
  Administered 2022-01-08 – 2022-01-29 (×2): 650 mg via ORAL
  Filled 2021-12-25 (×3): qty 2

## 2021-12-25 MED ORDER — APIXABAN 5 MG PO TABS
5.0000 mg | ORAL_TABLET | Freq: Two times a day (BID) | ORAL | Status: DC
  Administered 2021-12-25 – 2022-01-14 (×40): 5 mg
  Filled 2021-12-25 (×40): qty 1

## 2021-12-25 MED ORDER — ATORVASTATIN CALCIUM 80 MG PO TABS
80.0000 mg | ORAL_TABLET | Freq: Every day | ORAL | Status: DC
  Administered 2021-12-26 – 2022-02-08 (×45): 80 mg
  Filled 2021-12-25 (×47): qty 1

## 2021-12-25 MED ORDER — CHLORHEXIDINE GLUCONATE 0.12 % MT SOLN
15.0000 mL | Freq: Two times a day (BID) | OROMUCOSAL | Status: DC
  Administered 2021-12-25 – 2022-02-08 (×78): 15 mL via OROMUCOSAL
  Filled 2021-12-25 (×83): qty 15

## 2021-12-25 MED ORDER — OXYCODONE HCL 5 MG PO TABS
2.5000 mg | ORAL_TABLET | Freq: Four times a day (QID) | ORAL | Status: DC | PRN
  Filled 2021-12-25 (×2): qty 1

## 2021-12-25 MED ORDER — BUTALBITAL-APAP-CAFFEINE 50-325-40 MG PO TABS
1.0000 | ORAL_TABLET | Freq: Three times a day (TID) | ORAL | Status: DC | PRN
  Administered 2022-01-26: 1
  Filled 2021-12-25: qty 1

## 2021-12-25 MED ORDER — NUTRISOURCE FIBER PO PACK
1.0000 | PACK | Freq: Two times a day (BID) | ORAL | Status: DC
  Administered 2021-12-25 – 2022-02-08 (×89): 1
  Filled 2021-12-25 (×92): qty 1

## 2021-12-25 MED ORDER — AMLODIPINE BESYLATE 10 MG PO TABS: 10.0000 mg | ORAL_TABLET | Freq: Every day | ORAL | 0 refills | Status: DC

## 2021-12-25 MED ORDER — SCOPOLAMINE 1 MG/3DAYS TD PT72
1.0000 | MEDICATED_PATCH | TRANSDERMAL | Status: DC
Start: 2021-12-27 — End: 2022-02-08
  Administered 2021-12-27 – 2022-02-07 (×15): 1.5 mg via TRANSDERMAL
  Filled 2021-12-25 (×16): qty 1

## 2021-12-25 MED ORDER — LOSARTAN POTASSIUM 50 MG PO TABS
50.0000 mg | ORAL_TABLET | Freq: Every day | ORAL | Status: DC
  Administered 2021-12-26 – 2022-02-05 (×42): 50 mg
  Filled 2021-12-25 (×43): qty 1

## 2021-12-25 MED ORDER — MELATONIN 5 MG PO TABS
5.0000 mg | ORAL_TABLET | Freq: Every evening | ORAL | Status: DC | PRN
  Administered 2021-12-29 – 2022-01-26 (×13): 5 mg
  Filled 2021-12-25 (×13): qty 1

## 2021-12-25 MED ORDER — JEVITY 1.5 CAL/FIBER PO LIQD
1000.0000 mL | ORAL | Status: DC
  Administered 2021-12-25: 1000 mL
  Filled 2021-12-25 (×2): qty 1000

## 2021-12-25 MED ORDER — PANTOPRAZOLE SODIUM 40 MG PO PACK: 40.0000 mg | PACK | Freq: Every day | ORAL | 0 refills | Status: DC

## 2021-12-25 MED ORDER — FREE WATER
250.0000 mL | Freq: Four times a day (QID) | Status: DC
  Administered 2021-12-25 – 2022-01-03 (×34): 250 mL

## 2021-12-25 MED ORDER — PANTOPRAZOLE 2 MG/ML SUSPENSION
40.0000 mg | Freq: Every day | ORAL | Status: DC
  Administered 2021-12-26 – 2022-02-08 (×44): 40 mg
  Filled 2021-12-25 (×26): qty 20

## 2021-12-25 MED ORDER — ACETAMINOPHEN 160 MG/5ML PO SOLN
650.0000 mg | ORAL | Status: DC | PRN
  Administered 2022-01-09 – 2022-01-26 (×4): 650 mg
  Filled 2021-12-25 (×4): qty 20.3

## 2021-12-25 NOTE — Progress Notes (Signed)
INPATIENT REHABILITATION ADMISSION NOTE   Arrival Method: bed     Mental Orientation: oriented to self   Assessment: see flowsheets   Skin: redness to scrotum   IV'S: right upper extremity    Pain: none   Tubes and Drains: PEG tube, trach   Safety Measures: aspiration, fall risk    Vital Signs: see flowsheet    Height and Weight: see flowsheet   Rehab Orientation: family and patient  oriented to unit.    Family: sister at the bedside    Notes: continuous tube feedinds, trach care done by RT, wrist restraints in place.

## 2021-12-25 NOTE — Progress Notes (Signed)
Speech Language Pathology Treatment: Dysphagia;Cognitive-Linquistic;Passy Damita Lack Speaking valve  Patient Details Name: Jonthan Leite MRN: [id number] DOB: 05/31/1946 Today's Date: 12/25/2021 Time: 1045-1100 SLP Time Calculation (min) (ACUTE ONLY): 15 min  Assessment / Plan / Recommendation Clinical Impression  Pt demonstrates excellent tolerance of PMSV with cuffless trach. Phonation clear at phrase length, breath support adequate. Pt able to wear PMSV when awake with staff or family supervision. PMSV to be removed when alone as pt does sleep easily.   Pt exhibits ongoing aphasia with cues needed to correct paraphasias and neologisms. Pt able to read and repeat well. His attention is only briefly sustained to verbal and functional tasks and requires cues for eye contact with speaker. After two repetitions pt able to verbalize accurate orientation to place.   Swallowing subjectively appeared good with ice and sips of water, though there was one delayed cough. Pt was previously diagnosed with dysphagia and had silent aspiration on MBS, so repeat MBS is warranted soon to determine readiness for diet or specific trials with therapist.    HPI HPI: 76 yo male with stroke risk factors of CAD and HLD. Stat MRI showed bilateral cerebellar infarcts and pt noted to have developed ataxia. Patient was given TNK as he was still in the window. On repeat exam, prior to TNK, his NIHSS was 2 for right sided ataxia. Pt was evaluated by SLP and placed on a regular diet and nectar thick liquids given coughing with thin. Later, pt had a neurochange and underwent a suboccipital craniotomy to relieve ventricle pressure on brain stem with subsequent hemorrhagic conversion of cerebellar stroke. Pt's function has declined with increased signs of aspiration. MBS 12/16: moderate oropharyngeal dyspahgia with base of tongue propulsion particularly impacted, resulting in vallecular  residue. Sensed penetration and aspiration with  ejection with nectar thick liquids primarily after the swallow. NPO status with allowance of nectar thick water. ETT 12/20-12/22;12/26 reintub and trach performed. 12/27 trach collar trials; 12/29 PEG      SLP Plan  Continue with current plan of care      Recommendations for follow up therapy are one component of a multi-disciplinary discharge planning process, led by the attending physician.  Recommendations may be updated based on patient status, additional functional criteria and insurance authorization.    Recommendations         Patient may use Passy-Muir Speech Valve: During all waking hours (remove during sleep);Caregiver trained to provide supervision PMSV Supervision: Full         Oral Care Recommendations: Oral care BID Follow Up Recommendations: Acute inpatient rehab (3hours/day) Assistance recommended at discharge: Frequent or constant Supervision/Assistance SLP Visit Diagnosis: Dysphagia, unspecified (R13.10) Plan: Continue with current plan of care           Constantina Laseter, Katherene Ponto  12/25/2021, 11:25 AM

## 2021-12-25 NOTE — TOC Transition Note (Signed)
Transition of Care [facility]) - CM/SW Discharge Note   Patient Details  Name: [name] MRN: [id number] Date of Birth: Jun 19, 1946  [facility] CM/SW Contact:  Pollie Friar, RN Phone Number: 12/25/2021, 10:30 AM   Clinical Narrative:    Patient is discharging to CIR today. CM signing off.    Final next level of care: IP Rehab Facility Barriers to Discharge: No Barriers Identified   Patient Goals and CMS Choice   CMS Medicare.gov Compare Post Acute Care list provided to:: Patient Represenative (must comment) (wife) Choice offered to / list presented to : Patient  Discharge Placement                       Discharge Plan and Services   Discharge Planning Services: CM Consult Post Acute Care Choice: IP Rehab                               Social Determinants of Health (SDOH) Interventions     Readmission Risk Interventions No flowsheet data found.

## 2021-12-25 NOTE — H&P (Signed)
Physical Medicine and Rehabilitation Admission H&P        Chief Complaint  Patient presents with   Code Stroke  : HPI: [name] is a 76 year old right-handed male with history of CAD status post CABG x4 2012 in [place] maintained on low-dose aspirin, hyperlipidemia, nephrolithiasis, BPH/prostate cancer.  Per chart review patient lives with spouse in [place].  Independent prior to admission working part-time.  Wife can assist as needed.  1 level home with 2 steps to entry.  Presented 11/25/2021 with altered mental status as well as dizziness with ataxic gait and diaphoretic.  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  Negative for hemorrhage.  He did receive TNKase.  CT angiogram head and neck negative for intracranial large vessel occlusion.  No significant basilar stenosis or thrombus.  Follow-up MRI showed interval progression of edema associated to bilateral cerebellar acute infarct with mass-effect on the fourth ventricle and low-lying right cerebellar tonsil.  No hydrocephalus.  EKG showed sinus rhythm To ECGs and atrial flutter with rapid ventricular rates on the other ECG.  Echocardiogram with ejection fraction of 70 to 75% no wall motion abnormalities grade 1 diastolic dysfunction.  Cardiology services consulted for atrial flutter RVR placed on intravenous Cardizem.  Neurosurgery consulted in regards to cerebellar stroke increased intracranial pressure underwent suboccipital decompressive craniectomy 11/26/2021 per Dr. Venetia Constable.  Patient did remain intubated for prolonged time underwent tracheostomy 12/10/2021 per Dr.Chand As well as PEG tube placement 12/13/2021 per Dr. [name].  Hospital course complicated by aspiration pneumonia/right lower lobe pneumonia completing antibiotic course.  Tracheostomy tube was downsized to a #6 cuffless Shiley 12/24/2021.  He was later cleared to begin Eliquis for new onset atrial fibrillation 12/17/2021.  Bouts of  urinary retention with history of BPH maintained on Urecholine.  He remains n.p.o. with alternative means of nutritional support.  Hyperactive delirium placed on low-dose Zyprexa.  Therapy evaluations completed due to patient decreased functional mobility was admitted for a comprehensive rehab program.   Review of Systems  Unable to perform ROS: Acuity of condition      Past Medical History:  Diagnosis Date   CAD (coronary artery disease)      a. s/p CABG in 2012   Hyperlipidemia           Past Surgical History:  Procedure Laterality Date   CORONARY ARTERY BYPASS GRAFT   2012   ESOPHAGOGASTRODUODENOSCOPY N/A 12/13/2021    Procedure: ESOPHAGOGASTRODUODENOSCOPY (EGD);  Surgeon: Jesusita Oka, MD;  Location: [facility] ENDOSCOPY;  Service: General;  Laterality: N/A;   PEG PLACEMENT N/A 12/13/2021    Procedure: PERCUTANEOUS ENDOSCOPIC GASTROSTOMY (PEG) PLACEMENT;  Surgeon: Jesusita Oka, MD;  Location: [place] ENDOSCOPY;  Service: General;  Laterality: N/A;   SUBOCCIPITAL CRANIECTOMY CERVICAL LAMINECTOMY N/A 11/26/2021    Procedure: SUBOCCIPITAL CRANIECTOMY;  Surgeon: Judith Part, MD;  Location: [place];  Service: Neurosurgery;  Laterality: N/A;         Family History  Problem Relation Age of Onset   Cancer Brother     Coronary artery disease Brother      Social History:  reports that he has quit smoking. His smoking use included cigarettes. He has never used smokeless tobacco. He reports current alcohol use of about 3.0 standard drinks per week. He reports that he does not use drugs. Allergies: No Known Allergies       Medications Prior to Admission  Medication Sig Dispense Refill   aspirin EC 81 MG tablet Take  81 mg by mouth daily. Swallow whole.       atorvastatin (LIPITOR) 80 MG tablet Take 80 mg by mouth daily.       HYDROcodone-acetaminophen (NORCO/VICODIN) 5-325 MG tablet Take 1 tablet by mouth daily as needed for pain.       Omega-3 Fatty Acids (FISH OIL) 1000 MG CAPS Take  2,000 mg by mouth daily.       tamsulosin (FLOMAX) 0.4 MG CAPS capsule Take 0.4 mg by mouth daily.       tiZANidine (ZANAFLEX) 2 MG tablet Take 2 mg by mouth 3 (three) times daily.          Drug Regimen Review Drug regimen was reviewed and remains appropriate with no significant issues identified   Home: Home Living Family/patient expects to be discharged to:: Private residence Living Arrangements: Spouse/significant other Available Help at Discharge: Family, Available 24 hours/day Type of Home: House Home Access: Stairs to enter Technical brewer of Steps: 2 Entrance Stairs-Rails: Right Home Layout: One level Bathroom Shower/Tub: Multimedia programmer: Standard Home Equipment: Standard Environmental consultant, Radio producer - single point, Shower seat - built in, [employer] - toilet  Lives With: Spouse   Functional History: Prior Function Prior Level of Function : Independent/Modified Independent, Working/employed, Driving ADLs Comments: 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   Functional Status:  Mobility: [place] bed mobility: Needs Assistance Bed Mobility: Supine to Sit, Sit to Supine Rolling: Max assist, +2 for physical assistance, +2 for safety/equipment Sidelying to sit: Mod assist, HOB elevated Supine to sit: Mod assist, +2 for physical assistance, +2 for safety/equipment, HOB elevated Sit to supine: Mod assist, +2 for physical assistance, +2 for safety/equipment General bed mobility comments: patient participated more with sit to supine but continued to require assistance with trunk and BLEs, boost assist for positioning needed Transfers Overall transfer level: Needs assistance Equipment used: 2 person hand held assist Transfers: Sit to/from Stand, Bed to chair/wheelchair/BSC Sit to Stand: Mod assist, Max assist, +2 physical assistance Bed to/from chair/wheelchair/BSC transfer type:: Stand pivot, Squat pivot Stand pivot transfers: Max assist, +2 physical  assistance Squat pivot transfers: Max assist, +2 physical assistance, +2 safety/equipment Step pivot transfers: Mod assist, +2 physical assistance Transfer via Lift Equipment: Stedy General transfer comment: Patient was max assist +2 to transfer from EOB to recliner and max assist +2 to transfer from recliner to EOB due to fatigue. Sit<>stand x6 throughout session, at EOB and sink Ambulation/Gait Ambulation/Gait assistance:  (deferred due to safety concerns) General Gait Details: unable   ADL: ADL Overall ADL's : Needs assistance/impaired Eating/Feeding: NPO Eating/Feeding Details (indicate cue type and reason): peg Grooming: Maximal assistance Grooming Details (indicate cue type and reason): attempted to have patient comb hair and wash face.  Hand over hand attempted with patient being resistant Upper Body Bathing: Maximal assistance Lower Body Bathing: Total assistance Upper Body Dressing : Total assistance Lower Body Dressing: Total assistance Toilet Transfer: Maximal assistance, +2 for physical assistance, Ambulation (two person hand held) Functional mobility during ADLs: Moderate assistance, +2 for physical assistance (sit<>stand only) General ADL Comments: unable to follow commands for grooming   Cognition: Cognition Overall Cognitive Status: Difficult to assess Arousal/Alertness: Awake/alert Orientation Level: Intubated/Tracheostomy - Unable to assess Attention: Sustained, Selective Sustained Attention: Appears intact Selective Attention: Impaired Selective Attention Impairment: Verbal basic, Functional basic Memory: Appears intact Awareness: Impaired Awareness Impairment: Emergent impairment Problem Solving: Impaired Problem Solving Impairment: Verbal basic, Functional basic Executive Function: Reasoning, Self Monitoring, Self Correcting Reasoning:  Impaired Reasoning Impairment: Verbal basic, Functional basic Self Monitoring: Impaired Self Monitoring Impairment:  Verbal basic, Functional basic Safety/Judgment: Appears intact Cognition Arousal/Alertness: Awake/alert Behavior During Therapy: Restless, Impulsive (reached for trac and peg tube) Overall Cognitive Status: Difficult to assess Area of Impairment: Memory, Following commands Current Attention Level: Focused Following Commands: Follows one step commands inconsistently, Follows one step commands with increased time Safety/Judgement: Decreased awareness of safety, Decreased awareness of deficits Awareness: Intellectual Problem Solving: Slow processing General Comments: easily distracted on this date, reached for lines often.  participation and command following decreased with progression of treatment Difficult to assess due to: Tracheostomy   Physical Exam: Blood pressure 115/62, pulse (!) 55, temperature 99.1 F (37.3 C), temperature source Oral, resp. rate (!) 21, height 5\' 10"  (1.778 m), weight 85.9 kg, SpO2 98 %. Physical Exam Constitutional:      General: He is not in acute distress. HENT:     Head:     Comments: Craniotomy site clean and dry Eyes:     Conjunctiva/sclera: Conjunctivae normal.  Neck:     Comments: #6  cufflessTracheostomy tube in place. Clear sputum at end of trach Cardiovascular:     Rate and Rhythm: Normal rate and regular rhythm.     Heart sounds: No murmur heard.   No gallop.  Pulmonary:     Effort: Pulmonary effort is normal.     Comments: Scattered rhonchi and upper airway sounds. Abdominal:     General: There is no distension.     Tenderness: There is no abdominal tenderness. There is no guarding.     Comments: Gastrostomy tube in place, area clean  Musculoskeletal:        General: No swelling or tenderness.  Skin:    General: Skin is warm and dry.  Neurological:     Mental Status: He is alert.     Comments: Patient is lethargic but arousable. Oriented to self.  He does make eye contact with examiner.  Speaks but typically it was non-sensical or  unintelligible, dsyarthric. Moves all 4 limbs. Does not cooperate consistently with MMT. Seems to sense pain in all 4's. No resting tone, dtr's 1+  Psychiatric:     Comments: Restless and distracted when he's awake      Lab Results Last 48 Hours        Results for orders placed or performed during the hospital encounter of 11/25/21 (from the past 48 hour(s))  Glucose, capillary     Status: Abnormal    Collection Time: 12/22/21 11:48 AM  Result Value Ref Range    Glucose-Capillary 101 (H) 70 - 99 mg/dL      Comment: Glucose reference range applies only to samples taken after fasting for at least 8 hours.  Glucose, capillary     Status: Abnormal    Collection Time: 12/22/21  4:09 PM  Result Value Ref Range    Glucose-Capillary 123 (H) 70 - 99 mg/dL      Comment: Glucose reference range applies only to samples taken after fasting for at least 8 hours.  Glucose, capillary     Status: Abnormal    Collection Time: 12/22/21  8:16 PM  Result Value Ref Range    Glucose-Capillary 134 (H) 70 - 99 mg/dL      Comment: Glucose reference range applies only to samples taken after fasting for at least 8 hours.    Comment 1 Notify RN      Comment 2 Document in Chart    Glucose,  capillary     Status: Abnormal    Collection Time: 12/23/21 12:05 AM  Result Value Ref Range    Glucose-Capillary 127 (H) 70 - 99 mg/dL      Comment: Glucose reference range applies only to samples taken after fasting for at least 8 hours.    Comment 1 Notify RN      Comment 2 Document in Chart    Glucose, capillary     Status: Abnormal    Collection Time: 12/23/21  4:19 AM  Result Value Ref Range    Glucose-Capillary 119 (H) 70 - 99 mg/dL      Comment: Glucose reference range applies only to samples taken after fasting for at least 8 hours.  Glucose, capillary     Status: Abnormal    Collection Time: 12/23/21  7:40 AM  Result Value Ref Range    Glucose-Capillary 131 (H) 70 - 99 mg/dL      Comment: Glucose reference  range applies only to samples taken after fasting for at least 8 hours.    Comment 1 Notify RN      Comment 2 Document in Chart    Glucose, capillary     Status: Abnormal    Collection Time: 12/23/21 12:31 PM  Result Value Ref Range    Glucose-Capillary 111 (H) 70 - 99 mg/dL      Comment: Glucose reference range applies only to samples taken after fasting for at least 8 hours.  Glucose, capillary     Status: Abnormal    Collection Time: 12/23/21  4:16 PM  Result Value Ref Range    Glucose-Capillary 134 (H) 70 - 99 mg/dL      Comment: Glucose reference range applies only to samples taken after fasting for at least 8 hours.    Comment 1 Notify RN      Comment 2 Document in Chart    Glucose, capillary     Status: Abnormal    Collection Time: 12/23/21  7:58 PM  Result Value Ref Range    Glucose-Capillary 114 (H) 70 - 99 mg/dL      Comment: Glucose reference range applies only to samples taken after fasting for at least 8 hours.  Glucose, capillary     Status: Abnormal    Collection Time: 12/23/21 11:53 PM  Result Value Ref Range    Glucose-Capillary 127 (H) 70 - 99 mg/dL      Comment: Glucose reference range applies only to samples taken after fasting for at least 8 hours.    Comment 1 Notify RN      Comment 2 Document in Chart    Glucose, capillary     Status: Abnormal    Collection Time: 12/24/21  4:01 AM  Result Value Ref Range    Glucose-Capillary 111 (H) 70 - 99 mg/dL      Comment: Glucose reference range applies only to samples taken after fasting for at least 8 hours.    Comment 1 Notify RN      Comment 2 Document in Chart    Glucose, capillary     Status: Abnormal    Collection Time: 12/24/21  8:09 AM  Result Value Ref Range    Glucose-Capillary 123 (H) 70 - 99 mg/dL      Comment: Glucose reference range applies only to samples taken after fasting for at least 8 hours.  CBC     Status: None    Collection Time: 12/24/21  8:51 AM  Result Value Ref Range  WBC 5.0 4.0 -  10.5 K/uL    RBC 4.36 4.22 - 5.81 MIL/uL    Hemoglobin 13.2 13.0 - 17.0 g/dL    HCT 39.8 39.0 - 52.0 %    MCV 91.3 80.0 - 100.0 fL    MCH 30.3 26.0 - 34.0 pg    MCHC 33.2 30.0 - 36.0 g/dL    RDW 14.3 11.5 - 15.5 %    Platelets 220 150 - 400 K/uL    nRBC 0.0 0.0 - 0.2 %      Comment: Performed at [facility], [place] [address] [place], [place] [address]      Imaging Results (Last 48 hours)  No results found.         Medical Problem List and Plan: 1. 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  Status post posterior decompression with craniectomy 11/26/2021             -patient may not yet shower             -ELOS/Goals: 18-22 days, supervision to min assist goals 2.  Antithrombotics: -DVT/anticoagulation:  Pharmaceutical: Other (comment) Eliquis             -antiplatelet therapy: N/A 3. Pain Management: Zanaflex 1 mg 3 times daily, Voltaren gel 4 times daily, Fioricet as needed, oxycodone as needed 4. Mood/sleep: Klonopin 0.5 mg nightly, melatonin 5 mg daily as needed             -antipsychotic agents: Zyprexa 5 mg nightly             -begin sleep chart 5. Neuropsych: This patient is not capable of making decisions on his own behalf. 6. Skin/Wound Care: Routine skin checks 7. Fluids/Electrolytes/Nutrition: Routine in and outs with follow-up chemistries             -continue current TF 8.  Prolonged ventilatory support.  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   -Patient downsized to a #6 cuffless Shiley 12/24/2021--tolerating so far -PMV as tolerated, cap soon 9.  Dysphagia.  Status post gastrostomy tube 12/13/2021 per Dr.Lovick. -advance per speech therapy 10.  Aspiration pneumonia.  Antibiotic therapy completed. 11.  New onset atrial fibrillation.  Started Eliquis 12/17/2021.  Follow-up carotid service.  Continue low-dose beta-blocker. 12.  Hypertension.  Norvasc 10 mg daily, Cozaar 50 mg  daily, , clonidine 0.1 mg every 8 hours.  Monitor with increased mobility 13.  Hyperlipidemia.  Lipitor 14.  History of BPH/urinary retention.  Urecholine 10 mg 3 times daily 15.  CAD with CABG times 03/2011 in [place].  Follow-up per cardiology services.   Lavon Paganini Angiulli, PA-C 12/24/2021   I have personally performed a face to face diagnostic evaluation of this patient and formulated the key components of the plan.  Additionally, I have personally reviewed laboratory data, imaging studies, as well as relevant notes and concur with the physician assistant's documentation above.  The patient's status has not changed from the original H&P.  Any changes in documentation from the acute care chart have been noted above.  Meredith Staggers, MD, Mellody Drown

## 2021-12-25 NOTE — Progress Notes (Signed)
Inpatient Rehabilitation Admission Medication Review by a Pharmacist  A complete drug regimen review was completed for this patient to identify any potential clinically significant medication issues.  High Risk Drug Classes Is patient taking? Indication by Medication  Antipsychotic Yes Zyprexa for delirium, agitation  Anticoagulant Yes Eliquis for new afib and CVA.  Antibiotic No   Opioid Yes Fioricet, Oxycodone for pain (unspecified)  Antiplatelet No   Hypoglycemics/insulin No   Vasoactive Medication Yes Norvasc, Clonidine, losartan for CAD, HTN,  Chemotherapy No   Other No      Type of Medication Issue Identified Description of Issue Recommendation(s)  Drug Interaction(s) (clinically significant)     Duplicate Therapy     Allergy     No Medication Administration End Date     Incorrect Dose     Additional Drug Therapy Needed  Fish Oil, Flomax Resume at discharge  Significant med changes from prior encounter (inform family/care partners about these prior to discharge). D/c ASA D/c ASA  Other       Clinically significant medication issues were identified that warrant physician communication and completion of prescribed/recommended actions by midnight of the next day:  No  Time spent performing this drug regimen review (minutes):  71min   Lee-Ann Gal S. Alford Highland, PharmD, BCPS Clinical Staff Pharmacist [email or web address] [name], [name] 12/25/2021 3:50 PM

## 2021-12-25 NOTE — Progress Notes (Addendum)
Nutrition Follow-up  DOCUMENTATION CODES:   Not applicable  INTERVENTION:  Continue TF via PEG: Jevity 1.5 @ 55 ml/hr (1320 ml per day) Prosource TF 90 ml BID   Provides 2140 kcal, 128 gm protein, 1003 ml free water daily   250 ml free water every 6 hours   Total free water: 2003 ml  NUTRITION DIAGNOSIS:   Inadequate oral intake related to inability to eat as evidenced by NPO status.  ongoing  GOAL:   Patient will meet greater than or equal to 90% of their needs  Met with TF  MONITOR:   Diet advancement, TF tolerance  REASON FOR ASSESSMENT:   Consult  (free water needs)  ASSESSMENT:   Pt with PMH of CAD s/p CABG, HLD, OA, nephrolithiasis, BPH, and prostate cancer admitted with cerebellar stroke 12/11 s/p TNK.  12/11 admitted 12/13 pt with worsening edema s/p emergent crani 12/16 failed MBS, only allowed sips with awake 12/20 pt pulled out cortrak, that evening was re-intubated possible aspiration 12/21 s/p cortrak placement  12/22 extubated 12/25 NG tube placed  12/26 re-intubated s/p trach  12/29 s/p PEG placement 1/1 trach collar  Per CCM, pt is tolerating ATC. Pt remains slightly delirious but is able to follow commands/participate in care. Pt now pending rehab bed.   Pt NPO and continues to receive TF via PEG. Current TF: Jevity 1.5 @ 86m/hr w/ 98mProsource TF BID and 2508mree water Q6H  UOP: 1300m93m4 hours I/O: -763ml32mce admit  Current weight: 85.9 kg Admit weight: 87.3 kg   Edema: non-pitting edema to BUE per RN assessment  Medications: Scheduled Meds:  amLODipine  10 mg Per Tube Daily   apixaban  5 mg Per Tube BID   atorvastatin  80 mg Per Tube Daily   bethanechol  10 mg Per Tube TID   chlorhexidine  15 mL Mouth Rinse BID   Chlorhexidine Gluconate Cloth  6 each Topical Daily   clonazepam  0.5 mg Per Tube QHS   cloNIDine  0.1 mg Per Tube Q8H   diclofenac Sodium  2 g Topical QID   feeding supplement (PROSource TF)  90 mL Per  Tube BID   fiber  1 packet Per Tube BID   free water  250 mL Per Tube Q6H   losartan  50 mg Per Tube Daily   mouth rinse  15 mL Mouth Rinse q12n4p   OLANZapine zydis  5 mg Sublingual QHS   pantoprazole sodium  40 mg Per Tube Daily   scopolamine  1 patch Transdermal Q72H   tiZANidine  1 mg Per Tube TID  Continuous Infusions:  sodium chloride 250 mL (12/02/21 1930)   sodium chloride     feeding supplement (JEVITY 1.5 CAL/FIBER) 1,000 mL (12/24/21 1304)    Labs: Recent Labs  Lab 12/18/21 1358 12/19/21 0429 12/22/21 0542 12/25/21 0453  NA 135 135 133* 136  K 4.1 4.2 4.4 4.1  CL 100 101 100 97*  CO2 26 27 27  32  BUN 18 14 15 19   CREATININE 0.66 0.60* 0.65 0.82  CALCIUM 8.8* 8.5* 8.5* 8.6*  MG 2.2  --   --   --   GLUCOSE 138* 151* 138* 124*  CBGs: [id number]   Diet Order:   Diet Order             Diet NPO time specified  Diet effective midnight  EDUCATION NEEDS:   Not appropriate for education at this time  Skin:  Skin Assessment: Skin Integrity Issues: Skin Integrity Issues:: Incisions Incisions: head  Last BM:  1/8  Height:   Ht Readings from Last 1 Encounters:  12/13/21 5' 10"  (1.778 m)    Weight:   Wt Readings from Last 1 Encounters:  12/24/21 85.9 kg    BMI:  Body mass index is 27.17 kg/m.  Estimated Nutritional Needs:   Kcal:  2100-2300  Protein:  115-125 grams  Fluid:  > 2 l/day     Theone Stanley., MS, RD, LDN (she/her/hers) RD pager number and weekend/on-call pager number located in [place].

## 2021-12-25 NOTE — Progress Notes (Signed)
Inpatient Rehabilitation  Patient information reviewed and entered into eRehab system by Neel Buffone M. Keylah Darwish, M.A., CCC/SLP, PPS Coordinator.  Information including medical coding, functional ability and quality indicators will be reviewed and updated through discharge.    

## 2021-12-25 NOTE — Discharge Instructions (Addendum)
Inpatient Rehab Discharge Instructions  [name] Discharge date and time: No discharge date for patient encounter.   Activities/Precautions/ Functional Status: Activity: activity as tolerated Diet: Jevity tube feeds 356 mL 4 times daily H2O 250 mL every 4 hours by PEG tube Wound Care: Routine skin checks Functional status:  ___ No restrictions     ___ Walk up steps independently ___ 24/7 supervision/assistance   ___ Walk up steps with assistance ___ Intermittent supervision/assistance  ___ Bathe/dress independently ___ Walk with walker     __x_ Bathe/dress with assistance ___ Walk Independently    ___ Shower independently ___ Walk with assistance    ___ Shower with assistance ___ No alcohol     ___ Return to work/school ________   COMMUNITY REFERRALS UPON DISCHARGE:    Home Health:   PT     OT     ST    RN    SNA                    Agency: [facility]  Phone: [phone] *Please expect follow-up within 2-3 days from discharge to schedule your home visit. If you have not received follow-up, be sure to contact the branch directly.*  Medical Equipment/Items Ordered:wheelchair, rolling walker, 3in1 bedside commode                                                 Agency/Supplier: [employer] [phone]  Medical Equipment/Items Ordered:enteral feeds (tube feeds) Nutren 1.5                                                 Agency/Supplier: [employer] [phone]      Special Instructions: No smoking or alcohol   Follow-up with [facility] cardiology  Follow up DR Quenten Raven [phone] for persistent hematuria related to prostate cancer   STROKE/TIA DISCHARGE INSTRUCTIONS SMOKING Cigarette smoking nearly doubles your risk of having a stroke & is the single most alterable risk factor  If you smoke or have smoked in the last 12 months, you are advised to quit smoking for your health. Most of the excess cardiovascular risk related to smoking disappears within a year of  stopping. Ask you doctor about anti-smoking medications [place] Quit Line: 1-800-QUIT NOW Free Smoking Cessation Classes [phone]  CHOLESTEROL Know your levels; limit fat & cholesterol in your diet  Lipid Panel     Component Value Date/Time   CHOL 164 11/26/2021 0512   TRIG 319 (H) 12/06/2021 0118   HDL 48 11/26/2021 0512   CHOLHDL 3.4 11/26/2021 0512   VLDL 10 11/26/2021 0512   LDLCALC 106 (H) 11/26/2021 0512     Many patients benefit from treatment even if their cholesterol is at goal. Goal: Total Cholesterol (CHOL) less than 160 Goal:  Triglycerides (TRIG) less than 150 Goal:  HDL greater than 40 Goal:  LDL (LDLCALC) less than 100   BLOOD PRESSURE American Stroke Association blood pressure target is less that 120/80 mm/Hg  Your discharge blood pressure is:  BP: (!) 141/75 Monitor your blood pressure Limit your salt and alcohol intake Many individuals will require more than one medication for high blood pressure  DIABETES (A1c is a blood sugar average for last  3 months) Goal HGBA1c is under 7% (HBGA1c is blood sugar average for last 3 months)  Diabetes: No known diagnosis of diabetes    Lab Results  Component Value Date   HGBA1C 5.8 (H) 11/26/2021    Your HGBA1c can be lowered with medications, healthy diet, and exercise. Check your blood sugar as directed by your physician Call your physician if you experience unexplained or low blood sugars.  PHYSICAL ACTIVITY/REHABILITATION Goal is 30 minutes at least 4 days per week  Activity: Increase activity slowly, Therapies: Physical Therapy: Home Health Return to work:  Activity decreases your risk of heart attack and stroke and makes your heart stronger.  It helps control your weight and blood pressure; helps you relax and can improve your mood. Participate in a regular exercise program. Talk with your doctor about the best form of exercise for you (dancing, walking, swimming, cycling).  DIET/WEIGHT Goal is to maintain a  healthy weight  Your discharge diet is:  Diet Order     None       liquids Your height is:    Your current weight is:   Your Body Mass Index (BMI) is:    Following the type of diet specifically designed for you will help prevent another stroke. Your goal weight range is:   Your goal Body Mass Index (BMI) is 19-24. Healthy food habits can help reduce 3 risk factors for stroke:  High cholesterol, hypertension, and excess weight.  RESOURCES Stroke/Support Group:  Call [phone]   STROKE EDUCATION PROVIDED/REVIEWED AND GIVEN TO PATIENT Stroke warning signs and symptoms How to activate emergency medical system (call 911). Medications prescribed at discharge. Need for follow-up after discharge. Personal risk factors for stroke. Pneumonia vaccine given: No Flu vaccine given: No My questions have been answered, the writing is legible, and I understand these instructions.  I will adhere to these goals & educational materials that have been provided to me after my discharge from the hospital.       My questions have been answered and I understand these instructions. I will adhere to these goals and the provided educational materials after my discharge from the hospital.  Patient/Caregiver Signature _______________________________ Date __________  Clinician Signature _______________________________________ Date __________  Please bring this form and your medication list with you to all your follow-up doctor's appointments.        Information on my medicine - ELIQUIS (apixaban)  Why was Eliquis prescribed for you? Eliquis was prescribed for you to reduce the risk of a blood clot forming that can cause a stroke if you have a medical condition called atrial fibrillation (a type of irregular heartbeat).  What do You need to know about Eliquis ? Take your Eliquis TWICE DAILY - one tablet in the morning and one tablet in the evening with or without food. If you have difficulty  swallowing the tablet whole please discuss with your pharmacist how to take the medication safely.  Take Eliquis exactly as prescribed by your doctor and DO NOT stop taking Eliquis without talking to the doctor who prescribed the medication.  Stopping may increase your risk of developing a stroke.  Refill your prescription before you run out.  After discharge, you should have regular check-up appointments with your healthcare provider that is prescribing your Eliquis.  In the future your dose may need to be changed if your kidney function or weight changes by a significant amount or as you get older.  What do you do if you miss a  dose? If you miss a dose, take it as soon as you remember on the same day and resume taking twice daily.  Do not take more than one dose of ELIQUIS at the same time to make up a missed dose.  Important Safety Information A possible side effect of Eliquis is bleeding. You should call your healthcare provider right away if you experience any of the following: Bleeding from an injury or your nose that does not stop. Unusual colored urine (red or dark brown) or unusual colored stools (red or black). Unusual bruising for unknown reasons. A serious fall or if you hit your head (even if there is no bleeding).  Some medicines may interact with Eliquis and might increase your risk of bleeding or clotting while on Eliquis. To help avoid this, consult your healthcare provider or pharmacist prior to using any new prescription or non-prescription medications, including herbals, vitamins, non-steroidal anti-inflammatory drugs (NSAIDs) and supplements.  This website has more information on Eliquis (apixaban): http://[email or web address].com/eliquis/home

## 2021-12-25 NOTE — Progress Notes (Signed)
°  Inpatient Rehabilitation Admissions Coordinator   Cir Bed is available to admit him today. I spoke with patient, son at bedside and wife by phone. All in agreement to admit to CIR. Dr Doristine Bosworth, acute team and [facility] made aware. I will make the arrangements to admit today.  Danne Baxter, RN, MSN Rehab Admissions Coordinator [phone] 12/25/2021 10:51 AM

## 2021-12-25 NOTE — Discharge Summary (Signed)
PatientPhysician Discharge Summary  [name] IRS:[id number] DOB: October 13, 1946 DOA: 11/25/2021  PCP: Rogelia Rohrer, MD  Admit date: 11/25/2021 Discharge date: 12/25/2021 30 Day Unplanned Readmission Risk Score    Flowsheet Row ED to Hosp-Admission (Current) from 11/25/2021 in [facility]  30 Day Unplanned Readmission Risk Score (%) 25.92 Filed at 12/25/2021 0801       This score is the patient's risk of an unplanned readmission within 30 days of being discharged (0 -100%). The score is based on dignosis, age, lab data, medications, orders, and past utilization.   Low:  0-14.9   Medium: 15-21.9   High: 22-29.9   Extreme: 30 and above          Admitted From: Home Disposition: CIR  Recommendations for Outpatient Follow-up:  Follow up with PCP in 1-2 weeks Please obtain BMP/CBC in one week Follow-up with neurology within 1 to 2 weeks after discharge from acute rehab Please follow up with your PCP on the following pending results: Unresulted Labs (From admission, onward)     Start     Ordered   Signed and Held  Comprehensive metabolic panel  Tomorrow morning,   R       Question:  Specimen collection method  Answer:  Lab=Lab collect   Signed and Held   Signed and Held  CBC WITH DIFFERENTIAL  Tomorrow morning,   R       Question:  Specimen collection method  Answer:  Lab=Lab collect   Signed and Held              Home Health: None Equipment/Devices: None  Discharge Condition: Stable CODE STATUS: Full code Diet recommendation: Per PEG tube  Subjective: Seen and examined.  Son at the bedside.  Patient alert and oriented and following commands.  He has no complaints and he looks comfortable.  Brief/Interim Summary: 76 year old man who presented to [facility] 12/11 with cerebellar stroke. Received TNKase; he developed increasing mass effect on the 4th ventricle and was transferred to the ICU. Suboccipital craniectomy completed 12/13.  Repeat CT 12/14 demonstrated  hemorrhagic conversion.   Significant Hospital Events: Including procedures, antibiotic start and stop dates in addition to other pertinent events   12/11 admission, received TNKase 12/13 worsening edema/mass effect, had emergent craniotomy 12/14 hemorrhagic transformation of stroke 12/20 Finished unasyn for aspiration PNA, re intubated 12/22 Extubated 12/23 Palliative consulted 12/26 Re intubated. Tracheostomy performed, Finished ceftriaxone MRI> no new acute ischemia, improvement in brainstem compression, and abnormal signal at bilateral caudate tails and splenium  12/27 trach collar trials 12/28 off vent overnight  12/29 PEG, hypotensive post procedure, back on vent, CT head with no acute hemmorhage 1/1 Back to trach collar 1/3 Tolerating ATC, remains slightly delirious but able to follow commands/participate in care.  Transferred under [facility]   Cerebellar edema and compression of ventral pons s/p posterior decompression with craniectomy Hemorrhagic conversion of R cerebellar infarct 12/30 CT Head with no acute hemorrhage - Stroke team signed off. - ASA discontinued and Eliquis started on 12/17/2021 due to atrial fibrillation. - Repeat MRI in 1-2 months per Neuro, will need outpatient f/u.  PT OT recommends CIR, CIR bed and insurance authorization received.  Patient is being discharged in stable condition.   Acute hypoxic respiratory failure, s/p tracheostomy Possible aspiration pneumonia Right lower lobe pneumonia S/p 7 days Unasyn and 7 days of ceftriaxone, Off vent since 10 AM 12/28. Resumed s/p PEG.  - Pulmonary hygiene - Requiring intermittent NTS via trach, though cough  is stronger/secretions improving, continue scopolamine. - DuoNebs PRN.  Downsized to #6 cuffless on 12/24/2021.   Dysphagia Modified barium swallow showing aspiration, now s/p PED placed 12/29 - Continue to work with SLP   CAD with chronic HFpEF: Stable.   Hypertension: Very well controlled..  Currently on  amlodipine 10 mg, clonidine 0.1 mg 3 times daily and losartan 50 mg.   New onset atrial fibrillation: Started on Eliquis 12/17/2021.  Had episode of RVR, was started on Lopressor 25 mg twice daily.  This controlled the heart rate but then he developed bradycardia and Lopressor was discontinued.  Heart rate remains around 60.  Patient asymptomatic.   HLD: Statin.   Hyperactive delirium, improving - Continue Zyprexa   BPH Resume Flomax.   Discharge plan was discussed with patient and/or family member and they verbalized understanding and agreed with it.  Discharge Diagnoses:  Principal Problem:   Cerebellar stroke [facility]) Active Problems:   Atrial flutter (HCC)   Acute hypercapnic respiratory failure (HCC)   Massive aspiration syndrome   Acute ischemic stroke (HCC)   Acute respiratory failure with hypoxia (HCC)   Endotracheal tube present    Discharge Instructions   Allergies as of 12/25/2021   No Known Allergies      Medication List     STOP taking these medications    aspirin EC 81 MG tablet   HYDROcodone-acetaminophen 5-325 MG tablet Commonly known as: NORCO/VICODIN       TAKE these medications    amLODipine 10 MG tablet Commonly known as: NORVASC Place 1 tablet (10 mg total) into feeding tube daily. Start taking on: December 26, 2021   apixaban 5 MG Tabs tablet Commonly known as: ELIQUIS Place 1 tablet (5 mg total) into feeding tube 2 (two) times daily.   atorvastatin 80 MG tablet Commonly known as: LIPITOR Take 80 mg by mouth daily.   cloNIDine 0.1 MG tablet Commonly known as: CATAPRES Place 1 tablet (0.1 mg total) into feeding tube every 8 (eight) hours.   Fish Oil 1000 MG Caps Take 2,000 mg by mouth daily.   losartan 50 MG tablet Commonly known as: COZAAR Place 1 tablet (50 mg total) into feeding tube daily. Start taking on: December 26, 2021   OLANZapine zydis 5 MG disintegrating tablet Commonly known as: ZYPREXA Place 1 tablet (5 mg total)  under the tongue at bedtime.   pantoprazole sodium 40 mg Commonly known as: PROTONIX Place 40 mg into feeding tube daily.   scopolamine 1 MG/3DAYS Commonly known as: TRANSDERM-SCOP Place 1 patch (1.5 mg total) onto the skin every 3 (three) days. Start taking on: December 27, 2021   tamsulosin 0.4 MG Caps capsule Commonly known as: FLOMAX Take 0.4 mg by mouth daily.   tiZANidine 2 MG tablet Commonly known as: ZANAFLEX Take 2 mg by mouth 3 (three) times daily.        Follow-up Information     Min, Jenny Reichmann, MD Follow up in 1 week(s).   Specialty: Internal Medicine Contact information: [address] [address] [phone]                No Known Allergies  Consultations: PCCM, neurology, neurosurgery, palliative care, cardiology, general surgery   Procedures/Studies: CT HEAD WO CONTRAST (5MM)  Result Date: 12/14/2021 CLINICAL DATA:  Stroke follow-up EXAM: CT HEAD WITHOUT CONTRAST TECHNIQUE: Contiguous axial images were obtained from the base of the skull through the vertex without intravenous contrast. COMPARISON:  12/08/2021 FINDINGS: Brain:  Mildly decreased edema in the right cerebellar hemisphere. There is persistent herniation of the cerebellum through the posterior craniectomy defect. No acute hemorrhage. Unchanged size and configuration of the ventricles. Vascular: No abnormal hyperdensity of the major intracranial arteries or dural venous sinuses. No intracranial atherosclerosis. Skull: The visualized skull base, calvarium and extracranial soft tissues are normal. Sinuses/Orbits: Small amount of fluid in the left frontal sinus. The orbits are normal. IMPRESSION: 1. Mildly decreased edema in the right cerebellar hemisphere with persistent herniation of the cerebellum through the posterior craniectomy defect. 2. No acute hemorrhage. Electronically Signed   By: Ulyses Jarred M.D.   On: 12/14/2021 00:39   CT HEAD WO CONTRAST  (5MM)  Result Date: 12/08/2021 CLINICAL DATA:  Follow-up stroke.  Right cerebellar stroke. EXAM: CT HEAD WITHOUT CONTRAST TECHNIQUE: Contiguous axial images were obtained from the base of the skull through the vertex without intravenous contrast. COMPARISON:  12/05/2021 FINDINGS: Brain: Occipital craniectomy. Acute infarction within the right cerebellum with swelling and protrusion through the craniectomy defect. Sufficient patency of the fourth ventricle. Small infarction of the left cerebellum shown by MRI is not visible by CT. Cerebral hemispheres do not show any acute or focal finding. Small amount of blood dependent within the lateral ventricles as seen previously. Ventricular size is stable. No new infarction. Vascular: No acute vascular finding. Skull: Negative other than the occipital craniectomy. Sinuses/Orbits: Clear/normal Other: None IMPRESSION: No significant change. Infarction in the right cerebellum with swelling. True shin of swollen brain through the occipital craniectomy defect. Fourth ventricle remains patent. No change in ventricular size. Small amount of blood dependent within the occipital horns of the lateral ventricles has not increased. Small acute left cerebellar infarction shown by MRI is not visible by CT. Electronically Signed   By: Nelson Chimes M.D.   On: 12/08/2021 14:53   CT HEAD WO CONTRAST (5MM)  Result Date: 12/05/2021 CLINICAL DATA:  12/02/2021 EXAM: CT HEAD WITHOUT CONTRAST TECHNIQUE: Contiguous axial images were obtained from the base of the skull through the vertex without intravenous contrast. COMPARISON:  Altered mental status FINDINGS: Brain: Decompressive suboccipital craniotomy again noted. Evolving infarct within the right cerebellar hemisphere again noted demonstrating mild mass effect upon the fourth ventricle and with stable protrusion of the right cerebellar hemisphere through the craniotomy defect. Ventricular size is normal. Trace layering hemorrhage is seen  with the except ule horn of the lateral ventricles bilaterally, decreased since prior examination. No interval infarct. No interval hemorrhage. Mild periventricular white matter changes are present likely reflecting the sequela of small vessel ischemia. Vascular: No asymmetric hyperdense vasculature at the skull base. Skull: No acute fracture Sinuses/Orbits: Orbits are unremarkable. Paranasal sinuses are clear. Other: Mastoid air cells and middle ear cavities are clear. IMPRESSION: Evolving infarct within the right cerebral hemisphere demonstrating stable mild mass effect upon the right fourth ventricle and unchanged protrusion through the decompressive suboccipital craniotomy defect. Decreasing, minimal intraventricular hemorrhage. No interval hemorrhage or infarct. Electronically Signed   By: Fidela Salisbury M.D.   On: 12/05/2021 00:46   CT HEAD WO CONTRAST (5MM)  Result Date: 12/02/2021 CLINICAL DATA:  Right cerebellar infarct with hemorrhage, follow-up EXAM: CT HEAD WITHOUT CONTRAST TECHNIQUE: Contiguous axial images were obtained from the base of the skull through the vertex without intravenous contrast. COMPARISON:  CT head dated 1 day prior FINDINGS: Brain: Again seen is an evolving infarct in the right cerebellar hemisphere with associated hemorrhage. The hemorrhage is not significantly changed compared to the CT from 1 day  prior. The infarcted tissue appears more hyperdense consistent with expected evolution. There is swelling of the affected brain with partial effacement of the fourth ventricle and protrusion posteriorly through the craniectomy defect, not significantly changed. There is no upstream hydrocephalus. The smaller infarct in the left cerebellar hemisphere is unchanged. There is no evidence of new infarct or hemorrhage. There is no acute extra-axial fluid collection. Ventricles are stable in size. There is no mass lesion.  There is no midline shift. Vascular: No hyperdense vessel or  unexpected calcification. Skull: Postsurgical changes reflecting suboccipital craniectomy are again seen. Sinuses/Orbits: The paranasal sinuses are clear. The globes and orbits are unremarkable. Other: A nasoenteric catheter is partially imaged. IMPRESSION: Ongoing evolution of the right cerebellar hemisphere infarct with hemorrhagic transformation, with no significant interval change in the extent of blood products and unchanged partial effacement of fourth ventricle with no upstream hydrocephalus. Electronically Signed   By: Valetta Mole M.D.   On: 12/02/2021 18:52   CT HEAD WO CONTRAST (5MM)  Result Date: 12/01/2021 CLINICAL DATA:  Follow-up stroke EXAM: CT HEAD WITHOUT CONTRAST TECHNIQUE: Contiguous axial images were obtained from the base of the skull through the vertex without intravenous contrast. COMPARISON:  Three days ago FINDINGS: Brain: Known cerebellar infarction, right more than left, with hemorrhage and fourth ventricular narrowing post decompressive suboccipital craniectomy. Small volume hemorrhage layering in the occipital horns of the lateral ventricles. No ventricular dilatation or change. No new infarct. Vascular: Unremarkable Skull: Suboccipital craniectomy with unchanged bulging of the swollen brain. Sinuses/Orbits: Negative IMPRESSION: Stable cerebellar infarction with hemorrhage. No hydrocephalus or other interval change. Electronically Signed   By: Jorje Guild M.D.   On: 12/01/2021 05:03   CT HEAD WO CONTRAST (5MM)  Result Date: 11/28/2021 CLINICAL DATA:  Stroke, follow-up EXAM: CT HEAD WITHOUT CONTRAST TECHNIQUE: Contiguous axial images were obtained from the base of the skull through the vertex without intravenous contrast. COMPARISON:  11/27/2021 FINDINGS: Brain: Redemonstrated hypodensity in the right greater than left cerebellum, with new areas of hyperdensity in the right cerebellum, concerning for hemorrhage. There is grossly unchanged mass effect on the fourth  ventricle, without enlargement of the lateral or third ventricles to suggest hydrocephalus. No new area of infarction is identified. Vascular: No hyperdense vessel. Skull: Status post suboccipital craniotomy. Sinuses/Orbits: No acute finding. Other: The mastoids are well aerated. IMPRESSION: New hyperdensity in the right cerebellar infarct, most likely hemorrhage. No evidence of increased mass effect, with unchanged compression of the fourth ventricle without resulting hydrocephalus. These results will be called to the ordering clinician or representative by the Radiologist Assistant, and communication documented in the PACS or [employer]. Electronically Signed   By: Merilyn Baba M.D.   On: 11/28/2021 23:16   CT HEAD WO CONTRAST (5MM)  Result Date: 11/27/2021 CLINICAL DATA:  Stroke, follow-up suboccipital decompression EXAM: CT HEAD WITHOUT CONTRAST TECHNIQUE: Contiguous axial images were obtained from the base of the skull through the vertex without intravenous contrast. COMPARISON:  11/26/2021 FINDINGS: Brain: Status post interval suboccipital decompression, with pneumocephalus in the posterior fossa, not unexpected postoperatively. Redemonstrated hypodensity in the right-greater-than-left cerebellum, not significantly changed in extent since the prior exam. Persistent edema and mass effect on the fourth ventricle. Unchanged size and configuration of the lateral and third ventricles. No new area of infarction identified. No acute hemorrhage. Vascular: No hyperdense vessel. Skull: Status post suboccipital craniectomy.  No acute fracture. Sinuses/Orbits: Mucosal thickening in the ethmoid air cells. The orbits are unremarkable. Other: Trace fluid in right  mastoid air cells. The patient is intubated. IMPRESSION: Status post interval suboccipital decompression, with unchanged right greater than left cerebellar infarcts and no new infarction. Unchanged narrowing of the fourth ventricle, without evidence of  ventricular obstruction or hydrocephalus. Electronically Signed   By: Merilyn Baba M.D.   On: 11/27/2021 03:20   CT HEAD WO CONTRAST (5MM)  Result Date: 11/26/2021 CLINICAL DATA:  New onset aphasia and disorientation. Hydrocephalus. EXAM: CT HEAD WITHOUT CONTRAST TECHNIQUE: Contiguous axial images were obtained from the base of the skull through the vertex without intravenous contrast. COMPARISON:  MRI same day.  CT and MRI yesterday. FINDINGS: Brain: Low-density now present in the areas of acute cerebellar infarction, much more extensive in the right hemisphere than the left. Swelling and mass-effect upon the fourth ventricle. No evidence of hemorrhage. Third ventricular and lateral ventricular size remains stable, therefore there is no fourth ventricular obstruction. No new infarction is identified. Cerebral hemispheres appear normal. Vascular: No abnormal vascular finding. Skull: Normal Sinuses/Orbits: Clear/normal Other: None IMPRESSION: Low-density and swelling now present in the areas of acute cerebellar infarction, much more extensive on the right than the left. Mass-effect upon the fourth ventricle but no evidence of ventricular obstruction as evidenced by stable size of the lateral and third ventricles. Electronically Signed   By: Nelson Chimes M.D.   On: 11/26/2021 18:48   MR BRAIN WO CONTRAST  Result Date: 12/10/2021 CLINICAL DATA:  Acute neurologic deficit EXAM: MRI HEAD WITHOUT CONTRAST TECHNIQUE: Multiplanar, multiecho pulse sequences of the brain and surrounding structures were obtained without intravenous contrast. COMPARISON:  11/26/2021 brain MRI 12/08/2021 head CT FINDINGS: Brain: Mild residual diffusion abnormality in the right cerebellar hemisphere with associated petechial hemorrhage. No new site of acute ischemia. Moderate edema in the right cerebellar hemisphere. Mild generalized volume loss. No hydrocephalus. There is a large portion of the right cerebellum the remains herniated  through the craniectomy defect. Vascular: Major flow voids are preserved. Skull and upper cervical spine: Right suboccipital craniectomy. Sinuses/Orbits:No paranasal sinus fluid levels or advanced mucosal thickening. No mastoid or middle ear effusion. Normal orbits. IMPRESSION: 1. Mild residual diffusion abnormality in the right cerebellar hemisphere with associated petechial hemorrhage. No new site of acute ischemia. 2. Large portion of the right cerebellum remains herniated through the craniectomy defect. Electronically Signed   By: Ulyses Jarred M.D.   On: 12/10/2021 21:31   MR BRAIN WO CONTRAST  Result Date: 11/26/2021 CLINICAL DATA:  Neuro deficit, acute, stroke suspected. EXAM: MRI HEAD WITHOUT CONTRAST TECHNIQUE: Multiplanar, multiecho pulse sequences of the brain and surrounding structures were obtained without intravenous contrast. COMPARISON:  MRI of the brain November 25, 2021. FINDINGS: Acute infarct involving most of the right hemisphere and extending to the corresponding medial cerebellar peduncle with multiple punctate foci of susceptibility artifact consistent with petechial hemorrhage. Small infarcts in the left cerebellar hemisphere. There is interval progression of edema with mass effect on the fourth ventricle and low lying right cerebellar tonsil. There is no hydrocephalus. No focus of restricted diffusion in the supratentorial compartment. IMPRESSION: Interval progression of edema associated to bilateral cerebellar acute infarcts with mass effect on the fourth ventricle and low lying right cerebellar tonsil. No hydrocephalus. Electronically Signed   By: Pedro Earls M.D.   On: 11/26/2021 12:40   MR BRAIN WO CONTRAST  Result Date: 11/25/2021 CLINICAL DATA:  Stroke. EXAM: MRI HEAD WITHOUT CONTRAST TECHNIQUE: Multiplanar, multiecho pulse sequences of the brain and surrounding structures were obtained without intravenous contrast. COMPARISON:  CT head 11/25/2021 FINDINGS:  Brain: Acute infarct right superior cerebellum. Acute infarct left posterior lateral cerebellum. No associated hemorrhage. Ventricle size normal for age. Negative for hemorrhage or mass. No significant chronic ischemic change. Vascular: Normal arterial flow voids at the skull base. Skull and upper cervical spine: Negative Sinuses/Orbits: Minimal mucosal edema paranasal sinuses. Negative orbit Other: None IMPRESSION: Acute infarct in the cerebellum bilaterally. Negative for hemorrhage. Electronically Signed   By: Franchot Gallo M.D.   On: 11/25/2021 11:53   DG Chest Port 1 View  Result Date: 12/25/2021 CLINICAL DATA:  Difficulty breathing EXAM: PORTABLE CHEST 1 VIEW COMPARISON:  Previous studies including the examination of 12/18/2021 FINDINGS: Transverse diameter of heart is increased. Central pulmonary vessels are less prominent. Tip of tracheostomy is proximally 5.4 cm above the carina. There is evidence of previous coronary bypass surgery. There is interval improvement in aeration of both lower lung fields. There are small linear residual densities in the lower lung fields suggesting subsegmental atelectasis, more so on the left side. Left lateral CP angle is indistinct. There is no pneumothorax. IMPRESSION: Central pulmonary vessels are less prominent. There are no signs of alveolar pulmonary edema. There is improvement in aeration of both lower lung fields. Residual linear densities in the lower lung fields suggest subsegmental atelectasis. Possible small left pleural effusion. Electronically Signed   By: Elmer Picker M.D.   On: 12/25/2021 08:33   DG CHEST PORT 1 VIEW  Result Date: 12/18/2021 CLINICAL DATA:  Aspiration pneumonia , trach present EXAM: PORTABLE CHEST - 1 VIEW COMPARISON:  12/10/2021 FINDINGS: Low lung volumes. Increasing infiltrate or atelectasis at the right lung base. Partial improvement in the left retrocardiac consolidation/atelectasis seen previously. Heart size and mediastinal  contours are within normal limits. CABG markers. Blunting of the left lateral costophrenic angle as before. No pneumothorax. Sternotomy wires. Stable tracheostomy device. Gastric tube has been removed. IMPRESSION: 1. Low volumes with worsening atelectasis/infiltrate at the right lung base, improved aeration on the left. Electronically Signed   By: Lucrezia Europe M.D.   On: 12/18/2021 11:59   DG Chest Port 1 View  Result Date: 12/10/2021 CLINICAL DATA:  Tracheostomy EXAM: PORTABLE CHEST 1 VIEW COMPARISON:  12/10/2021, 12/06/2021 FINDINGS: Post sternotomy changes. Esophageal tube tip below the diaphragm but incompletely visualized. Interim placement of tracheostomy tube with tip at the level of mid clavicles, about 4.5 cm superior to carina. Similar small left effusion with slightly worsened basilar airspace disease. Stable cardiomediastinal silhouette with atherosclerosis. IMPRESSION: 1. Tracheostomy tube placement as above. 2. Small left-sided pleural effusion with worsening consolidation at the left base. Electronically Signed   By: Donavan Foil M.D.   On: 12/10/2021 18:49   Portable Chest x-ray  Result Date: 12/10/2021 CLINICAL DATA:  ETT, history of aspiration EXAM: PORTABLE CHEST 1 VIEW COMPARISON:  12/06/2021 FINDINGS: Mild patchy left lower lobe opacity, suspicious for pneumonia. Right lung is clear. Endotracheal tube terminates 4.5 cm above the carina. Enteric tube terminates in the mid esophagus. Advancement into the stomach (roughly 20 cm) is suggested. IMPRESSION: Endotracheal tube terminates 4.5 cm above the carina. Mild patchy left lower lobe opacity, suspicious for pneumonia. Enteric tube terminates in the mid esophagus. Advancement to the stomach (roughly 20 cm) is suggested. Electronically Signed   By: Julian Hy M.D.   On: 12/10/2021 07:05   DG Chest Port 1 View  Result Date: 12/06/2021 CLINICAL DATA:  Acute respiratory failure with hypoxia EXAM: PORTABLE CHEST 1 VIEW COMPARISON:   12/04/2021 FINDINGS: Prior CABG.  Endotracheal to is 3 cm above the carina. Feeding tube enters the stomach. Heart is normal size. No confluent opacity on the right. Left basilar opacity, favor atelectasis. No effusions or acute bony abnormality. IMPRESSION: Low lung volumes.  Left base atelectasis. Electronically Signed   By: Rolm Baptise M.D.   On: 12/06/2021 08:07   DG CHEST PORT 1 VIEW  Result Date: 12/04/2021 CLINICAL DATA:  Code stroke.  Shortness of breath. EXAM: PORTABLE CHEST 1 VIEW COMPARISON:  Chest x-ray 11/29/2021. FINDINGS: Endotracheal tube tip is 5.7 cm above the carina. Patient is status post cardiac surgery. Enteric tube extends below the diaphragm, distal tip is not included on the image. There is some minimal patchy infrahilar opacities bilaterally. Costophrenic angles are clear. There is no pneumothorax. No acute fractures are seen. IMPRESSION: 1. Patchy bilateral infrahilar opacities may represent aspiration or infection. Electronically Signed   By: Ronney Asters M.D.   On: 12/04/2021 23:22   DG CHEST PORT 1 VIEW  Result Date: 11/29/2021 CLINICAL DATA:  Acute respiratory failure. EXAM: PORTABLE CHEST 1 VIEW COMPARISON:  November 27, 2021. FINDINGS: Stable cardiomediastinal silhouette. Status post coronary bypass graft. Endotracheal tube has been removed. Mild central pulmonary vascular congestion is noted. Mild bibasilar subsegmental atelectasis or edema is noted with small right pleural effusion. Bony thorax is unremarkable. IMPRESSION: Mild bibasilar subsegmental atelectasis or edema is noted with small right pleural effusion. Electronically Signed   By: Marijo Conception M.D.   On: 11/29/2021 11:14   DG CHEST PORT 1 VIEW  Result Date: 11/27/2021 CLINICAL DATA:  Check intubation status. EXAM: PORTABLE CHEST 1 VIEW COMPARISON:  None. FINDINGS: Endotracheal tube is in place with the tip 6 cm from the carina. There are intact sternotomy sutures with CABG changes. On the left there is  increased opacity in the lower lobe distribution consistent with pneumonia or aspiration with small left pleural effusion consistent with a parapneumonic pleural effusion. The remaining lungs are generally clear. The right sulci are sharp. There is thoracic spondylosis. IMPRESSION: 1. ETT tip 6 cm from the carina. 2. Left lower lobe consolidation with small adjacent left pleural effusion. Findings are likely either due to pneumonia or aspiration. 3. Clinical correlation and radiographic follow-up recommended. Electronically Signed   By: Telford Nab M.D.   On: 11/27/2021 04:40   DG Abd Portable 1V  Result Date: 12/12/2021 CLINICAL DATA:  A 76 year old male presents for evaluation of gastric tube placement. EXAM: PORTABLE ABDOMEN - 1 VIEW COMPARISON:  Comparison is made with December 10, 2021. FINDINGS: The gastric tube tip is now in the distal descending duodenum approximately 13 cm beyond the gastric antrum. EKG leads project over the abdomen. Scattered loops of gas-filled small and large bowel are noted without substantial distension. Basilar airspace disease at the lung bases similar to prior imaging. On limited assessment no acute skeletal process. IMPRESSION: The gastric tube tip is now in the distal descending duodenum approximately 13-14 cm beyond the gastric antrum. These results will be called to the ordering clinician or representative by the Radiologist Assistant, and communication documented in the PACS or [employer]. Electronically Signed   By: Zetta Bills M.D.   On: 12/12/2021 11:06   DG Abd Portable 1V  Result Date: 12/10/2021 CLINICAL DATA:  NG tube placement EXAM: PORTABLE ABDOMEN - 1 VIEW COMPARISON:  Chest radiograph dated 12/10/2021 FINDINGS: Enteric tube terminates in the proximal stomach. Nonobstructive bowel gas pattern. IMPRESSION: Enteric tube terminates in the proximal stomach. Electronically Signed  By: Julian Hy M.D.   On: 12/10/2021 08:02   DG Abd Portable  1V  Result Date: 12/09/2021 CLINICAL DATA:  Enteric tube placement EXAM: PORTABLE ABDOMEN - 1 VIEW COMPARISON:  December 08, 2021 FINDINGS: Incomplete assessment of the pelvis. Enteric tube side port projects just below the GE junction. Tip projects over the proximal stomach. Bibasilar opacities, likely atelectasis. No dilated loops of bowel are seen. Degenerative changes of the lumbar spine. IMPRESSION: Enteric tube tip projects over the proximal stomach. Electronically Signed   By: Valentino Saxon M.D.   On: 12/09/2021 15:57   DG Abd Portable 1V  Result Date: 12/08/2021 CLINICAL DATA:  NG placement EXAM: PORTABLE ABDOMEN - 1 VIEW COMPARISON:  Radiograph dated 12/05/2021. FINDINGS: Partially visualized enteric tube with side-port in the epigastric area and tip in the proximal stomach. Bibasilar atelectasis or infiltrate. Cardiomegaly. Median sternotomy wires. IMPRESSION: Partially visualized enteric tube with tip in the proximal stomach. Electronically Signed   By: Anner Crete M.D.   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 EXAM: PORTABLE ABDOMEN - 1 VIEW COMPARISON:  December 05, 2021. FINDINGS: EKG leads project over the upper abdomen. There is a feeding tube at the level of the antrum/pylorus tip directed distally. Contrast present in the appendix and in the distal colon. No signs of gross bowel dilation. Spinal degenerative changes.  No acute regional skeletal process. IMPRESSION: Feeding tube at the level of the antrum/pylorus tip directed distally. Electronically Signed   By: Zetta Bills M.D.   On: 12/05/2021 12:38   DG Abd Portable 1V  Result Date: 12/05/2021 CLINICAL DATA:  Tube placement EXAM: PORTABLE ABDOMEN - 1 VIEW COMPARISON:  11/30/2021 abdominal radiograph FINDINGS: Enteric tube terminates in the distal body of the stomach. No dilated small bowel loops. Retained oral contrast in  the left colon. No evidence of pneumatosis or pneumoperitoneum. IMPRESSION: Enteric tube terminates in the distal body of the stomach. Electronically Signed   By: Ilona Sorrel M.D.   On: 12/05/2021 08:09   DG Abd Portable 1V  Result Date: 11/30/2021 CLINICAL DATA:  Counter for feeding tube placement. EXAM: PORTABLE ABDOMEN - 1 VIEW COMPARISON:  None. FINDINGS: The feeding tube terminates slightly to the right of midline in the mid abdomen, likely in the distal stomach. IMPRESSION: The distal tip of the feeding tube is likely in the distal gastric body. Recommend advancement into the distal duodenum/proximal jejunum before use. Electronically Signed   By: Dorise Bullion III M.D.   On: 11/30/2021 14:09   DG Swallowing Func-Speech Pathology  Result Date: 11/30/2021 Table formatting from the original result was not included. Objective Swallowing Evaluation: Type of Study: MBS-Modified Barium Swallow Study  Patient Details Name: Gearld Kerstein MRN: [id number] Date of Birth: [birth year] Today's Date: 11/30/2021 Time: SLP Start Time (ACUTE ONLY): 8588 -SLP Stop Time (ACUTE ONLY): 5027 SLP Time Calculation (min) (ACUTE ONLY): 12 min Past Medical History: Past Medical History: Diagnosis Date  CAD (coronary artery disease)   a. s/p CABG in 2012  Hyperlipidemia  Past Surgical History: Past Surgical History: Procedure Laterality Date  CORONARY ARTERY BYPASS GRAFT  2012  SUBOCCIPITAL CRANIECTOMY CERVICAL LAMINECTOMY N/A 11/26/2021  Procedure: SUBOCCIPITAL CRANIECTOMY;  Surgeon: Judith Part, MD;  Location: [place];  Service: Neurosurgery;  Laterality: N/A; HPI: 76 yo male with stroke risk factors of CAD and HLD. Stat MRI showed bilateral cerebellar infarcts and pt noted to  have developed ataxia. Patient was given TNK as he was still in the window. On repeat exam, prior to TNK, his NIHSS was 2 for right sided ataxia. Pt was evalauted by SLP and placed on a regualr diet and nectar thick liquids given coughing with  thin. Later, pt had a neurochange and underwent a suboccipital craniotomy to relieve ventricle pressure on brain stem with subsequent hemorrhagic conversion of cerebellar stroke. Pts function has declined with increased signs of aspiration. MBS to determine ability to continue with PO intake.  No data recorded  Recommendations for follow up therapy are one component of a multi-disciplinary discharge planning process, led by the attending physician.  Recommendations may be updated based on patient status, additional functional criteria and insurance authorization. Assessment / Plan / Recommendation Clinical Impressions 11/30/2021 Clinical Impression Pt demonstates a moderate oropharyngeal dyspahgia with base of tongue propulsion particularly impacted, resulting in vallecular (eventually spilling to pyriform sinus) residue post swallow. Pt also with a slight delay in swallow initiation. Pt experienced sensed penetration and aspiration with ejection with nectar thick liquids primarily after the swallow. Residue could never be fully cleared despite positional strategies and cues for effort. Thin liquids were aspirated with sensation before and after the swallow. Pt recommended to have alternate method of nutriiton placed since his mentation has been variable and appetite rather poor. He does become agitated when NPO, so nectar thick water after oral care is allowed. SLP will f/u for therapy and trials. SLP Visit Diagnosis Dysarthria and anarthria (R47.1);Dysphagia, unspecified (R13.10);Aphasia (R47.01) Attention and concentration deficit following -- Frontal lobe and executive function deficit following -- Impact on safety and function Moderate aspiration risk   Treatment Recommendations 11/30/2021 Treatment Recommendations Therapy as outlined in treatment plan below   Prognosis 11/30/2021 Prognosis for Safe Diet Advancement Good Barriers to Reach Goals -- Barriers/Prognosis Comment -- Diet Recommendations 11/30/2021  SLP Diet Recommendations Nectar thick liquid;NPO Liquid Administration via Straw;Cup Medication Administration Via alternative means Compensations Slow rate;Small sips/bites Postural Changes Remain semi-upright after after feeds/meals (Comment);Seated upright at 90 degrees   Other Recommendations 11/30/2021 Recommended Consults -- Oral Care Recommendations Oral care BID Other Recommendations -- Follow Up Recommendations Acute inpatient rehab (3hours/day) Assistance recommended at discharge Frequent or constant Supervision/Assistance Functional Status Assessment Patient has had a recent decline in their functional status and demonstrates the ability to make significant improvements in function in a reasonable and predictable amount of time. Frequency and Duration  11/30/2021 Speech Therapy Frequency (ACUTE ONLY) min 2x/week Treatment Duration 2 weeks   Oral Phase 11/30/2021 Oral Phase WFL Oral - Pudding Teaspoon -- Oral - Pudding Cup -- Oral - Honey Teaspoon -- Oral - Honey Cup -- Oral - Nectar Teaspoon -- Oral - Nectar Cup -- Oral - Nectar Straw -- Oral - Thin Teaspoon -- Oral - Thin Cup -- Oral - Thin Straw -- Oral - Puree -- Oral - Mech Soft -- Oral - Regular -- Oral - Multi-Consistency -- Oral - Pill -- Oral Phase - Comment --  Pharyngeal Phase 11/30/2021 Pharyngeal Phase Impaired Pharyngeal- Pudding Teaspoon -- Pharyngeal -- Pharyngeal- Pudding Cup -- Pharyngeal -- Pharyngeal- Honey Teaspoon -- Pharyngeal -- Pharyngeal- Honey Cup -- Pharyngeal -- Pharyngeal- Nectar Teaspoon Reduced tongue base retraction;Delayed swallow initiation-vallecula;Pharyngeal residue - valleculae Pharyngeal -- Pharyngeal- Nectar Cup Delayed swallow initiation-vallecula;Penetration/Aspiration during swallow;Penetration/Apiration after swallow;Trace aspiration;Reduced tongue base retraction Pharyngeal Material enters airway, CONTACTS cords and not ejected out;Material does not enter airway Pharyngeal- Nectar Straw Penetration/Aspiration  during swallow;Penetration/Apiration after swallow;Reduced tongue base retraction;Pharyngeal  residue - valleculae;Trace aspiration Pharyngeal Material enters airway, passes BELOW cords then ejected out;Material enters airway, remains ABOVE vocal cords then ejected out;Material does not enter airway Pharyngeal- Thin Teaspoon -- Pharyngeal -- Pharyngeal- Thin Cup Reduced tongue base retraction;Penetration/Aspiration before swallow;Pharyngeal residue - valleculae;Delayed swallow initiation-pyriform sinuses;Moderate aspiration Pharyngeal Material enters airway, passes BELOW cords and not ejected out despite cough attempt by patient Pharyngeal- Thin Straw Penetration/Apiration after swallow;Pharyngeal residue - valleculae;Pharyngeal residue - pyriform;Delayed swallow initiation-pyriform sinuses Pharyngeal Material enters airway, passes BELOW cords and not ejected out despite cough attempt by patient Pharyngeal- Puree -- Pharyngeal -- Pharyngeal- Mechanical Soft -- Pharyngeal -- Pharyngeal- Regular -- Pharyngeal -- Pharyngeal- Multi-consistency -- Pharyngeal -- Pharyngeal- Pill -- Pharyngeal -- Pharyngeal Comment --  No flowsheet data found. DeBlois, Katherene Ponto 11/30/2021, 11:36 AM                     EEG adult  Result Date: 12/10/2021 Lora Havens, MD     12/10/2021 10:39 AM Patient Name: Purl Claytor MRN: [id number] Epilepsy Attending: Lora Havens Referring Physician/Provider: Maryjane Hurter, MD Date: 12/10/2021 Duration: 23.13 mins Patient history: 76 year old male with bilateral cerebellar infarct, noted to have movements concerning for seizure.  EEG to evaluate for seizure. Level of alertness: lethargic AEDs during EEG study: None Technical aspects: This EEG study was done with scalp electrodes positioned according to the 10-20 International system of electrode placement. Electrical activity was acquired at a sampling rate of 500Hz  and reviewed with a high frequency filter of 70Hz  and a low  frequency filter of 1Hz . EEG data were recorded continuously and digitally stored. Description: EEG showed continuous generalized 3 to 6 Hz theta-delta slowing. Hyperventilation and photic stimulation were not performed.   ABNORMALITY - Continuous slow, generalized IMPRESSION: This study is suggestive of moderate to severe diffuse encephalopathy, nonspecific etiology. No seizures or epileptiform discharges were seen throughout the recording. If suspicion for ictal-interictal activity remains a concern, a prolonged study including sleep can be considered. Lora Havens   ECHOCARDIOGRAM COMPLETE  Result Date: 11/26/2021    ECHOCARDIOGRAM REPORT   Patient Name:   TRACY KINNER Date of Exam: 11/26/2021 Medical Rec #:  [id number]     Height:       70.0 in Accession #:    [id number]    Weight:       216.3 lb Date of Birth:  1946/05/06     BSA:          2.158 m Patient Age:    38 years      BP:           163/80 mmHg Patient Gender: M             HR:           68 bpm. Exam Location:  Inpatient Procedure: 2D Echo, Cardiac Doppler and Color Doppler Indications:    Stroke.  History:        Patient has no prior history of Echocardiogram examinations.                 CAD, Prior CABG; Risk Factors:Dyslipidemia.  Sonographer:    Philipp Deputy RDCS Referring Phys: [place]  1. Left ventricular ejection fraction, by estimation, is 70 to 75%. The left ventricle has hyperdynamic function. The left ventricle has no regional wall motion abnormalities. The left ventricular internal cavity size was mildly dilated. Left ventricular diastolic parameters are consistent with Grade I diastolic dysfunction (impaired  relaxation). Elevated left atrial pressure.  2. RV is not well seen but overall RVEF is probably normal . The right ventricular size is normal.  3. The mitral valve is normal in structure. Mild mitral valve regurgitation.  4. The aortic valve is tricuspid. Aortic valve regurgitation is not  visualized. Aortic valve sclerosis/calcification is present, without any evidence of aortic stenosis.  5. The inferior vena cava is normal in size with greater than 50% respiratory variability, suggesting right atrial pressure of 3 mmHg. FINDINGS  Left Ventricle: Left ventricular ejection fraction, by estimation, is 70 to 75%. The left ventricle has hyperdynamic function. The left ventricle has no regional wall motion abnormalities. The left ventricular internal cavity size was mildly dilated. There is no left ventricular hypertrophy. Left ventricular diastolic parameters are consistent with Grade I diastolic dysfunction (impaired relaxation). Elevated left atrial pressure. Right Ventricle: RV is not well seen but overall RVEF is probably normal. The right ventricular size is normal. Right vetricular wall thickness was not assessed. Left Atrium: Left atrial size was normal in size. Right Atrium: Right atrial size was normal in size. Pericardium: There is no evidence of pericardial effusion. Mitral Valve: The mitral valve is normal in structure. Mild mitral valve regurgitation. Tricuspid Valve: The tricuspid valve is grossly normal. Tricuspid valve regurgitation is trivial. Aortic Valve: The aortic valve is tricuspid. Aortic valve regurgitation is not visualized. Aortic valve sclerosis/calcification is present, without any evidence of aortic stenosis. Pulmonic Valve: The pulmonic valve was not well visualized. Pulmonic valve regurgitation is not visualized. Aorta: The aortic root and ascending aorta are structurally normal, with no evidence of dilitation. Venous: The inferior vena cava is normal in size with greater than 50% respiratory variability, suggesting right atrial pressure of 3 mmHg. IAS/Shunts: No atrial level shunt detected by color flow Doppler.  LEFT VENTRICLE PLAX 2D LVIDd:         5.20 cm Diastology LVIDs:         3.00 cm LV e' medial:    4.90 cm/s LV PW:         1.00 cm LV E/e' medial:  26.3 LV IVS:         1.00 cm LV e' lateral:   4.46 cm/s                        LV E/e' lateral: 28.9  RIGHT VENTRICLE RV Basal diam:  3.60 cm RV S prime:     11.60 cm/s TAPSE (M-mode): 2.4 cm LEFT ATRIUM             Index        RIGHT ATRIUM           Index LA diam:        4.00 cm 1.85 cm/m   RA Area:     16.30 cm LA Vol (A2C):   93.3 ml 43.24 ml/m  RA Volume:   41.50 ml  19.23 ml/m LA Vol (A4C):   68.7 ml 31.84 ml/m LA Biplane Vol: 85.6 ml 39.67 ml/m  AORTIC VALVE LVOT Vmax:   139.00 cm/s LVOT Vmean:  97.400 cm/s LVOT VTI:    0.284 m  AORTA Ao Root diam: 4.00 cm MITRAL VALVE MV Area (PHT): 3.61 cm     SHUNTS MV Decel Time: 210 msec     Systemic VTI: 0.28 m MV E velocity: 129.00 cm/s MV A velocity: 34.30 cm/s MV E/A ratio:  3.76 Dorris Carnes MD Electronically signed by Nevin Bloodgood  Harrington Challenger MD Signature Date/Time: 11/26/2021/4:24:00 PM    Final    CT ANGIO HEAD NECK W WO CM (CODE STROKE)  Result Date: 11/25/2021 CLINICAL DATA:  Acute neuro deficit. Cerebellar infarct bilaterally on MRI today. EXAM: CT ANGIOGRAPHY HEAD AND NECK TECHNIQUE: Multidetector CT imaging of the head and neck was performed using the standard protocol during bolus administration of intravenous contrast. Multiplanar CT image reconstructions and MIPs were obtained to evaluate the vascular anatomy. Carotid stenosis measurements (when applicable) are obtained utilizing NASCET criteria, using the distal internal carotid diameter as the denominator. CONTRAST:  30mL OMNIPAQUE IOHEXOL 350 MG/ML SOLN COMPARISON:  MRI head 11/26/2019.  CT head 11/25/2021 FINDINGS: CTA NECK FINDINGS Aortic arch: Suboptimal arterial opacification due to scan timing. Minimal atherosclerotic calcification aortic arch. Proximal great vessels widely patent. Right carotid system: Mild atherosclerotic calcification right carotid bifurcation without significant stenosis. Left carotid system: Mild atherosclerotic calcification left carotid bifurcation without significant stenosis. Vertebral  arteries: Both vertebral arteries widely patent without significant stenosis. Skeleton: Mild degenerative change cervical spine. No acute skeletal abnormality. Other neck: Mild degenerative change cervical spine. No acute skeletal abnormality. Upper chest: Lung apices clear bilaterally. Review of the MIP images confirms the above findings CTA HEAD FINDINGS Anterior circulation: Internal carotid artery widely patent through the skull base and cavernous segments. Anterior and middle cerebral arteries widely patent and normal bilaterally. Posterior circulation: Suboptimal arterial opacification due to scan timing. Both vertebral arteries patent to the basilar. PICA patent bilaterally. Basilar widely patent without significant stenosis or thrombus. Mild atherosclerotic irregularity. Superior cerebellar arteries patent bilaterally. Proximal left AICA patent. Right AICA not visualized. Venous sinuses: Normal venous enhancement. Anatomic variants: None Review of the MIP images confirms the above findings IMPRESSION: 1. Negative for intracranial large vessel occlusion. No significant basilar stenosis or thrombus. 2. Suboptimal arterial opacification due to scan timing. 3. Mild atherosclerotic disease in the carotid bifurcation bilaterally. Negative for carotid or vertebral artery stenosis. Electronically Signed   By: Franchot Gallo M.D.   On: 11/25/2021 13:16   CT Angio Abd/Pel W and/or Wo Contrast  Result Date: 11/25/2021 CLINICAL DATA:  GI bleed EXAM: CTA ABDOMEN AND PELVIS WITHOUT AND WITH CONTRAST TECHNIQUE: Multidetector CT imaging of the abdomen and pelvis was performed using the standard protocol during bolus administration of intravenous contrast. Multiplanar reconstructed images and MIPs were obtained and reviewed to evaluate the vascular anatomy. CONTRAST:  121mL OMNIPAQUE IOHEXOL 350 MG/ML SOLN COMPARISON:  None. FINDINGS: VASCULAR Aorta: Normal caliber aorta without aneurysm, dissection, vasculitis or  significant stenosis. Moderate atherosclerotic disease of the abdominal aorta consisting of calcified and noncalcified plaque. Celiac: Patent without evidence of aneurysm, dissection, vasculitis or significant stenosis. SMA: Patent without evidence of aneurysm, dissection, vasculitis or significant stenosis. Renals: Both renal arteries are patent without evidence of aneurysm, dissection, vasculitis, fibromuscular dysplasia or significant stenosis. IMA: Patent without evidence of aneurysm, dissection, vasculitis or significant stenosis. Inflow: Patent without evidence of aneurysm, dissection, vasculitis or significant stenosis. Mild scattered calcified and noncalcified plaque. Proximal Outflow: Bilateral common femoral and visualized portions of the superficial and profunda femoral arteries are patent without evidence of aneurysm, dissection, vasculitis or significant stenosis. Veins: No obvious venous abnormality within the limitations of this arterial phase study. Review of the MIP images confirms the above findings. NON-VASCULAR Lower chest: Right greater than left bibasilar opacities, likely due to scarring or atelectasis. Coronary artery calcifications. Hepatobiliary: Low-attenuation lesions of the left lobe of the liver which are likely simple cysts. No suspicious hepatic lesions. Gallbladder  is unremarkable. No biliary ductal dilation. Pancreas: Unremarkable. No pancreatic ductal dilatation or surrounding inflammatory changes. Spleen: Normal in size without focal abnormality. Adrenals/Urinary Tract: Bilateral adrenal glands are unremarkable. Kidneys enhance symmetrically with no evidence of hydronephrosis or nephrolithiasis. No suspicious filling defects of the opacified renal collecting systems. Bladder is unremarkable. Stomach/Bowel: Small hiatal hernia. Stomach is otherwise unremarkable. Diverticulosis. Normal appearance of the gallbladder. No bowel wall thickening, inflammatory change or evidence of  obstruction. Arterial and venous phase images demonstrate no evidence of active GI bleed. Lymphatic: No significant vascular findings are present. No enlarged abdominal or pelvic lymph nodes. Reproductive: Brachytherapy seeds noted in the prostate. Other: Small fat containing left inguinal hernia. Musculoskeletal: Prior left total hip arthroplasty. No aggressive appearing osseous lesions. IMPRESSION: VASCULAR 1. No evidence of active GI bleed. 2. Moderate aortic Atherosclerosis (ICD10-I70.0). NON-VASCULAR 1. Diverticulosis with no evidence of diverticulitis. Electronically Signed   By: Yetta Glassman M.D.   On: 11/25/2021 14:36     Discharge Exam: Vitals:   12/25/21 0721 12/25/21 0930  BP: 138/63   Pulse: 60 (!) 59  Resp: 18 11  Temp: 98.7 F (37.1 C)   SpO2:  100%   Vitals:   12/25/21 0026 12/25/21 0322 12/25/21 0721 12/25/21 0930  BP: (!) 145/63 (!) 153/70 138/63   Pulse: 60 62 60 (!) 59  Resp: 10 18 18 11   Temp:  98 F (36.7 C) 98.7 F (37.1 C)   TempSrc:  Axillary Oral   SpO2: 98% 98%  100%  Weight:      Height:        General: Pt is alert, awake, not in acute distress Cardiovascular: RRR, S1/S2 +, no rubs, no gallops, trach in place. Respiratory: CTA bilaterally, no wheezing, no rhonchi Abdominal: Soft, NT, ND, bowel sounds +, G-tube in place. Extremities: no edema, no cyanosis    The results of significant diagnostics from this hospitalization (including imaging, microbiology, ancillary and laboratory) are listed below for reference.     Microbiology: No results found for this or any previous visit (from the past 240 hour(s)).   Labs: BNP (last 3 results) No results for input(s): BNP in the last 8760 hours. Basic Metabolic Panel: Recent Labs  Lab 12/18/21 1358 12/19/21 0429 12/22/21 0542 12/25/21 0453  NA 135 135 133* 136  K 4.1 4.2 4.4 4.1  CL 100 101 100 97*  CO2 26 27 27  32  GLUCOSE 138* 151* 138* 124*  BUN 18 14 15 19   CREATININE 0.66 0.60* 0.65  0.82  CALCIUM 8.8* 8.5* 8.5* 8.6*  MG 2.2  --   --   --    Liver Function Tests: No results for input(s): AST, ALT, ALKPHOS, BILITOT, PROT, ALBUMIN in the last 168 hours. No results for input(s): LIPASE, AMYLASE in the last 168 hours. No results for input(s): AMMONIA in the last 168 hours. CBC: Recent Labs  Lab 12/18/21 1358 12/19/21 0429 12/22/21 0542 12/24/21 0851 12/25/21 0453  WBC 9.2 6.0 5.9 5.0 4.7  NEUTROABS 7.7 3.9 4.1  --  2.9  HGB 13.8 14.7 12.3* 13.2 13.4  HCT 40.5 43.6 37.0* 39.8 40.5  MCV 90.2 90.3 91.6 91.3 92.5  PLT 306 249 237 220 240   Cardiac Enzymes: No results for input(s): CKTOTAL, CKMB, CKMBINDEX, TROPONINI in the last 168 hours. BNP: Invalid input(s): POCBNP CBG: Recent Labs  Lab 12/24/21 2032 12/24/21 2326 12/25/21 0310 12/25/21 0336 12/25/21 0724  GLUCAP 104* 110* 133* 130* 120*   D-Dimer No results for  input(s): DDIMER in the last 72 hours. Hgb A1c No results for input(s): HGBA1C in the last 72 hours. Lipid Profile No results for input(s): CHOL, HDL, LDLCALC, TRIG, CHOLHDL, LDLDIRECT in the last 72 hours. Thyroid function studies No results for input(s): TSH, T4TOTAL, T3FREE, THYROIDAB in the last 72 hours.  Invalid input(s): FREET3 Anemia work up No results for input(s): VITAMINB12, FOLATE, FERRITIN, TIBC, IRON, RETICCTPCT in the last 72 hours. Urinalysis    Component Value Date/Time   COLORURINE YELLOW 11/25/2021 1534   APPEARANCEUR CLEAR 11/25/2021 1534   LABSPEC 1.033 (H) 11/25/2021 1534   PHURINE 6.0 11/25/2021 1534   GLUCOSEU 50 (A) 11/25/2021 1534   HGBUR SMALL (A) 11/25/2021 1534   BILIRUBINUR NEGATIVE 11/25/2021 1534   KETONESUR 20 (A) 11/25/2021 1534   PROTEINUR NEGATIVE 11/25/2021 1534   NITRITE NEGATIVE 11/25/2021 1534   LEUKOCYTESUR NEGATIVE 11/25/2021 1534   Sepsis Labs Invalid input(s): PROCALCITONIN,  WBC,  LACTICIDVEN Microbiology No results found for this or any previous visit (from the past 240  hour(s)).   Time coordinating discharge: Over 30 minutes  SIGNED:   Darliss Cheney, MD  [facility] 12/25/2021, 10:47 AM  If 7PM-7AM, please contact night-coverage [email or web address]

## 2021-12-25 NOTE — Progress Notes (Signed)
Meredith Staggers, MD  Physician Physical Medicine and Rehabilitation PMR Pre-admission    Signed Date of Service:  12/24/2021  3:24 PM  Related encounter: ED to Hosp-Admission (Discharged) from 11/25/2021 in [facility]   Signed      Show:Clear all [x] Written[x] Templated[x] Copied  Added by: [x] Cristina Gong, RN[x] Meredith Staggers, MD  [] Hover for details                                                                                                                                                                                                                                                                                                                                                                                                                                                                PMR Admission Coordinator Pre-Admission Assessment   Patient: [name] is an 76 y.o., male MRN: [id number] DOB: 1945-12-22 Height: 5' 10"  (177.8 cm) Weight: 85.9 kg   Insurance Information HMO:     PPO:      PCP:      IPA:      80/20:      OTHER:  PRIMARY: Medicare a and b      Policy#: [id number]      Subscriber: none Benefits:  Phone #: passport one source online  Name: 12/19/21 Eff. Date: a 01/17/2012 and b 07/16/2012     Deduct: $1600      Out of Pocket Max: none      Life Max: none CIR: 100%      SNF: 20 full days Outpatient: 80%     Co-Pay: 20% Home Health: 100%      Co-Pay: none DME: 80%     Co-Pay: 20% Providers: pt choice  SECONDARY: BCBS supplement      Policy#: [id number]   Financial Counselor:       Phone#:    The Data Collection Information Summary for patients in Inpatient Rehabilitation Facilities with attached Privacy Act [place] Records was provided and verbally reviewed with: Patient  and Family   Emergency Contact Information Contact Information       Name Relation Home Work Mobile    [place] Spouse     [place] Sister     [phone]         Current Medical History  Patient Admitting Diagnosis: CVA   History of Present Illness:  76 year old right-handed male with history of CAD status post CABG x4 2012 in [place] maintained on low-dose aspirin, hyperlipidemia, nephrolithiasis, BPH/prostate cancer.   Presented 11/25/2021 with altered mental status as well as dizziness with ataxic gait and diaphoretic.  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  Negative for hemorrhage.  He did receive TNKase.  CT angiogram head and neck negative for intracranial large vessel occlusion.  No significant basilar stenosis or thrombus.  Follow-up MRI showed interval progression of edema associated to bilateral cerebellar acute infarct with mass-effect on the fourth ventricle and low-lying right cerebellar tonsil.  No hydrocephalus.  EKG showed sinus rhythm.To ECGs and atrial flutter with rapid ventricular rates on the other ECG.  Echocardiogram with ejection fraction of 70 to 75% no wall motion abnormalities grade 1 diastolic dysfunction.  Cardiology services consulted for atrial flutter RVR placed on intravenous Cardizem.  Neurosurgery consulted in regards to cerebellar stroke increased intracranial pressure underwent suboccipital decompressive craniectomy 11/26/2021 per Dr. Venetia Constable.  Patient did remain intubated for prolonged time underwent tracheostomy 12/10/2021 per Dr.Chand As well as PEG tube placement 12/13/2021 per Dr. [name].  Hospital course complicated by aspiration pneumonia/right lower lobe pneumonia completing antibiotic course.  He was later cleared to begin Eliquis for new onset atrial fibrillation 12/17/2021.  Bouts of urinary retention with history of BPH maintained on Urecholine.  He remains n.p.o. with alternative means  of nutritional support.  Hyperactive delirium placed on low-dose Zyprexa.  PCCM consulted 12/24/21 with plans to downsize to #6 trach.   Complete NIHSS TOTAL: 4   Patient's medical record from [facility] has been reviewed by the rehabilitation admission coordinator and physician.   Past Medical History      Past Medical History:  Diagnosis Date   CAD (coronary artery disease)      a. s/p CABG in 2012   Hyperlipidemia        Has the patient had major surgery during 100 days prior to admission? Yes   Family History   family history includes Cancer in his brother; Coronary artery disease in his brother.   Current Medications   Current Facility-Administered Medications:    0.9 %  sodium chloride infusion, 250 mL, Intravenous, Continuous, Chand, Sudham, MD, Last Rate: 10 mL/hr at 12/02/21 1930, 250 mL at 12/02/21 1930   0.9 %  sodium chloride infusion, 250 mL, Intravenous, Continuous, Meier, Hortencia Conradi,  MD   acetaminophen (TYLENOL) tablet 650 mg, 650 mg, Oral, Q4H PRN, 650 mg at 12/04/21 1203 **OR** acetaminophen (TYLENOL) 160 MG/5ML solution 650 mg, 650 mg, Per Tube, Q4H PRN, 650 mg at 12/17/21 2258 **OR** acetaminophen (TYLENOL) suppository 650 mg, 650 mg, Rectal, Q4H PRN, Judith Part, MD, 650 mg at 11/29/21 2141   amLODipine (NORVASC) tablet 10 mg, 10 mg, Per Tube, Daily, Rosalin Hawking, MD, 10 mg at 12/25/21 0925   apixaban (ELIQUIS) tablet 5 mg, 5 mg, Per Tube, BID, Pierce, Dwayne A, RPH, 5 mg at 12/25/21 0925   atorvastatin (LIPITOR) tablet 80 mg, 80 mg, Per Tube, Daily, Rosalin Hawking, MD, 80 mg at 12/25/21 0925   bethanechol (URECHOLINE) tablet 10 mg, 10 mg, Per Tube, TID, Rosalin Hawking, MD, 10 mg at 12/25/21 0925   butalbital-acetaminophen-caffeine (FIORICET) 50-325-40 MG per tablet 1 tablet, 1 tablet, Per Tube, Q8H PRN, Rosalin Hawking, MD   chlorhexidine (PERIDEX) 0.12 % solution 15 mL, 15 mL, Mouth Rinse, BID, Rosalin Hawking, MD, 15 mL at 12/25/21 0920   Chlorhexidine  Gluconate Cloth 2 % PADS 6 each, 6 each, Topical, Daily, Judith Part, MD, 6 each at 12/24/21 0865   clonazePAM (KLONOPIN) disintegrating tablet 0.5 mg, 0.5 mg, Per Tube, QHS, de Yolanda Manges, Cortney E, NP, 0.5 mg at 12/24/21 2100   cloNIDine (CATAPRES) tablet 0.1 mg, 0.1 mg, Per Tube, Q8H, Maryjane Hurter, MD, 0.1 mg at 12/25/21 0518   diclofenac Sodium (VOLTAREN) 1 % topical gel 2 g, 2 g, Topical, QID, Pahwani, Ravi, MD, 2 g at 12/25/21 0935   docusate (COLACE) 50 MG/5ML liquid 100 mg, 100 mg, Per Tube, BID PRN, Andres Labrum D, PA-C   feeding supplement (JEVITY 1.5 CAL/FIBER) liquid 1,000 mL, 1,000 mL, Per Tube, Continuous, Saverio Danker, PA-C, Last Rate: 55 mL/hr at 12/24/21 1304, 1,000 mL at 12/24/21 1304   feeding supplement (PROSource TF) liquid 90 mL, 90 mL, Per Tube, BID, Simonne Maffucci B, MD, 90 mL at 12/25/21 0926   fiber (NUTRISOURCE FIBER) 1 packet, 1 packet, Per Tube, BID, Maryjane Hurter, MD, 1 packet at 12/25/21 [phone]   free water 250 mL, 250 mL, Per Tube, Q6H, Estill Cotta, NP, 250 mL at 12/25/21 0522   hydrALAZINE (APRESOLINE) injection 10-20 mg, 10-20 mg, Intravenous, Q4H PRN, Rosalin Hawking, MD, 20 mg at 12/18/21 0809   ipratropium-albuterol (DUONEB) 0.5-2.5 (3) MG/3ML nebulizer solution 3 mL, 3 mL, Nebulization, Q6H PRN, Rosalin Hawking, MD   losartan (COZAAR) tablet 50 mg, 50 mg, Per Tube, Daily, Rosalin Hawking, MD, 50 mg at 12/25/21 9629   MEDLINE mouth rinse, 15 mL, Mouth Rinse, q12n4p, Rosalin Hawking, MD, 15 mL at 12/24/21 1630   melatonin tablet 5 mg, 5 mg, Per Tube, QHS PRN, Jacky Kindle, MD, 5 mg at 12/20/21 0146   metoprolol tartrate (LOPRESSOR) injection 5 mg, 5 mg, Intravenous, Q4H PRN, Judith Part, MD, 5 mg at 11/28/21 0641   OLANZapine zydis (ZYPREXA) disintegrating tablet 5 mg, 5 mg, Sublingual, QHS, Shafer, [name], NP, 5 mg at 12/24/21 2100   OLANZapine zydis (ZYPREXA) disintegrating tablet 5 mg, 5 mg, Oral, Daily PRN, Janine Ores, NP   ondansetron  (ZOFRAN) injection 4 mg, 4 mg, Intravenous, Q6H PRN, Judith Part, MD   oxyCODONE (Oxy IR/ROXICODONE) immediate release tablet 5 mg, 5 mg, Per Tube, Q6H PRN, 5 mg at 12/20/21 0146 **OR** oxyCODONE (Oxy IR/ROXICODONE) immediate release tablet 2.5 mg, 2.5 mg, Per Tube, Q6H PRN, Estill Cotta, NP  pantoprazole sodium (PROTONIX) 40 mg/20 mL oral suspension 40 mg, 40 mg, Per Tube, Daily, Rosalin Hawking, MD, 40 mg at 12/25/21 0926   polyethylene glycol (MIRALAX / GLYCOLAX) packet 17 g, 17 g, Per Tube, Daily PRN, Mick Sell, PA-C   scopolamine (TRANSDERM-SCOP) 1 MG/3DAYS 1.5 mg, 1 patch, Transdermal, Q72H, Pahwani, Ravi, MD, 1.5 mg at 12/24/21 1304   tiZANidine (ZANAFLEX) tablet 1 mg, 1 mg, Per Tube, TID, Maryjane Hurter, MD, 1 mg at 12/25/21 4696   Patients Current Diet:  Diet Order                  Diet NPO time specified  Diet effective midnight                     PEG feeds   Precautions / Restrictions Precautions Precautions: Fall Precaution Comments: trach, peg, primo fit, wrist restraints and mitts bilat Restrictions Weight Bearing Restrictions: No Other Position/Activity Restrictions: Wears a lift in R shoe s/p 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 No   Prior Activity Level Community (5-7x/wk): independent, drives, worked part time at [employer] at [place]: Did the patient need help bathing, dressing, using the toilet or eating? Independent   Indoor Mobility: Did the patient need assistance with walking from room to room (with or without device)? Independent   Stairs: Did the patient need assistance with internal or external stairs (with or without device)? Independent   Functional Cognition: Did the patient need help planning regular tasks such as shopping or remembering to take medications? Independent   Patient  Information Are you of Hispanic, Latino/a,or Spanish origin?: A. No, not of Hispanic, Latino/a, or Spanish origin What is your race?: A. White Do you need or want an interpreter to communicate with a doctor or health care staff?: 0. No   Patient's Response To:  Health Literacy and Transportation Is the patient able to respond to health literacy and transportation needs?: Yes Health Literacy - How often do you need to have someone help you when you read instructions, pamphlets, or other written material from your doctor or pharmacy?: Never In the past 12 months, has lack of transportation kept you from medical appointments or from getting medications?: No In the past 12 months, has lack of transportation kept you from meetings, work, or from getting things needed for daily living?: No   Development worker, international aid / [place] Devices/Equipment: None Home Equipment: Standard Walker, Radio producer - single point, Shower seat - built in, [employer] - toilet   Prior Device Use: Indicate devices/aids used by the patient prior to current illness, exacerbation or injury? None of the above   Current Functional Level Cognition   Arousal/Alertness: Awake/alert Overall Cognitive Status: Difficult to assess Difficult to assess due to: Tracheostomy Current Attention Level: Alternating Orientation Level: Intubated/Tracheostomy - Unable to assess Following Commands: Follows one step commands inconsistently, Follows one step commands with increased time Safety/Judgement: Decreased awareness of safety, Decreased awareness of deficits General Comments: more lethargic this session but followed commands improved Attention: Sustained, Selective Sustained Attention: Appears intact Selective Attention: Impaired Selective Attention Impairment: Verbal basic, Functional basic Memory: Appears intact Awareness: Impaired Awareness Impairment: Emergent impairment Problem Solving: Impaired Problem Solving  Impairment: Verbal basic, Functional basic Executive Function: Reasoning, Self Monitoring, Self Correcting Reasoning: Impaired  Reasoning Impairment: Verbal basic, Functional basic Self Monitoring: Impaired Self Monitoring Impairment: Verbal basic, Functional basic Safety/Judgment: Appears intact    Extremity Assessment (includes Sensation/Coordination)   Upper Extremity Assessment: Difficult to assess due to impaired cognition RUE Deficits / Details: R UE weakness noted and decreased coordination. pt completed hand to noses to therapist hand and back on command. pt undershooting iwth task. pt with decreased strength to sustain against gravity RUE Sensation: WNL RUE Coordination: decreased gross motor, decreased fine motor LUE Deficits / Details: WFL AROM shoulder elbow and wrist. pt pulling at all lines/ lead with L. pt pulling at collar of trach LUE Coordination: decreased fine motor, decreased gross motor  Lower Extremity Assessment: Defer to PT evaluation RLE Deficits / Details: grossly 4/5 RLE Coordination: decreased gross motor (ataxic movement)     ADLs   Overall ADL's : Needs assistance/impaired Eating/Feeding: NPO Eating/Feeding Details (indicate cue type and reason): peg Grooming: Moderate assistance, Standing, Wash/dry face Grooming Details (indicate cue type and reason): washed face while standing at sink with assitance of two for balance Upper Body Bathing: Maximal assistance Lower Body Bathing: Total assistance Upper Body Dressing : Total assistance Lower Body Dressing: Total assistance Toilet Transfer: Moderate assistance, +2 for physical assistance, BSC/3in1 Toilet Transfer Details (indicate cue type and reason): patient stood at sink with mod assist +2 and recliner was moved and BSC placed behind patient for toilet transfer [place] and Hygiene: Maximal assistance, Sit to/from stand Toileting - Clothing Manipulation Details (indicate cue type  and reason): patient stood at sink with mod assist +2 for toilet hygiene Functional mobility during ADLs: Moderate assistance, +2 for physical assistance (sit<>stand only) General ADL Comments: increased ability to follow commands for sit to stands and grooming     Mobility   Overal bed mobility: Needs Assistance Bed Mobility: Supine to Sit, Sit to Supine Rolling: Max assist, +2 for physical assistance, +2 for safety/equipment Sidelying to sit: Mod assist, HOB elevated Supine to sit: Min assist, +2 for safety/equipment Sit to supine: Min guard General bed mobility comments: assist with bed pad to scoot forward to edge of bed upon ascenscion to sitting. patient was able to get BLEs into bed and trunk for sit to supine     Transfers   Overall transfer level: Needs assistance Equipment used: 2 person hand held assist Transfers: Sit to/from Stand, Bed to chair/wheelchair/BSC Sit to Stand: Mod assist, +2 physical assistance, +2 safety/equipment Bed to/from chair/wheelchair/BSC transfer type:: Stand pivot Stand pivot transfers: Mod assist, +2 physical assistance Squat pivot transfers: Max assist, +2 physical assistance, +2 safety/equipment Step pivot transfers: Mod assist, +2 physical assistance Transfer via Lift Equipment: [place] transfer comment: increased ability to follow commands for sit to stand and improved initiation. pt requiring modA + 2 to power up, multimodal cues for bilateral knee extension, cervical extension. able to weight shift R/L without knee buckle. x 8 sit to stands in total     Ambulation / Gait / Stairs / Wheelchair Mobility   Ambulation/Gait Ambulation/Gait assistance:  (deferred due to safety concerns) General Gait Details: unable     Posture / Balance Dynamic Sitting Balance Sitting balance - Comments: min assist for sitting balance Balance Overall balance assessment: Needs assistance Sitting-balance support: No upper extremity supported, Feet  supported Sitting balance-Leahy Scale: Poor Sitting balance - Comments: min assist for sitting balance Postural control: Posterior lean Standing balance support: Bilateral upper extremity supported Standing balance-Leahy Scale: Poor Standing balance comment: mod assist x2 to stand  at sink with patient performing 8 stands     Special needs/care consideration 12/10/21 trach; downsized to # 6 uncuffed 1/9 PEG 24 FR LUQ 12/13/2021    Previous Home Environment  Living Arrangements: Spouse/significant other  Lives With: Spouse Available Help at Discharge:  (wife works from home, son from [place] now local) Type of Home: [place]: One level Home Access: Stairs to enter Entrance Stairs-Rails: Right Technical brewer of Steps: 2 Bathroom Shower/Tub: Multimedia programmer: Standard Bathroom Accessibility: Yes How Accessible: Accessible via [place]: No   Discharge [place] for Discharge Living Setting: Patient's home, Lives with (comment) (wife) Type of Home at Discharge: House Discharge Home Layout: One level Discharge Home Access: Stairs to enter Entrance Stairs-Rails: Right Entrance Stairs-Number of Steps: 2 Discharge Bathroom Shower/Tub: Walk-in shower Discharge Bathroom Toilet: Standard Discharge Bathroom Accessibility: Yes How Accessible: Accessible via walker Does the patient have any problems obtaining your medications?: No   Social/Family/Support Systems Patient Roles: Spouse, 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) Contact Information: wife, [name] Anticipated Caregiver: wife, son and family Anticipated Caregiver's Contact Information: see contacts Ability/Limitations of Caregiver: wife works from home, son form [place] here Administrator, Civil Service Availability: 24/7 Discharge Plan Discussed with Primary Caregiver: Yes Is Caregiver In Agreement with Plan?: Yes Does Caregiver/Family have Issues with  Lodging/Transportation while Pt is in Rehab?: No   Goals Patient/Family Goal for Rehab: supervision to min assist with PT, OT and SLP Expected length of stay: ELOS 14 to 20 days Pt/Family Agrees to Admission and willing to participate: Yes Program Orientation Provided & Reviewed with Pt/Caregiver Including Roles  & Responsibilities: Yes Additional Information Needs: Preference was for [facility] due to where they live, but [facility] would not take him with Trach   Decrease burden of Care through IP rehab admission: n/a   Possible need for SNF placement upon discharge: not anticipated   Patient Condition: I have reviewed medical records from [facility], spoken with CM, and patient, spouse, son, and family member. I met with patient at the bedside for inpatient rehabilitation assessment.  Patient will benefit from ongoing PT, OT, and SLP, can actively participate in 3 hours of therapy a day 5 days of the week, and can make measurable gains during the admission.  Patient will also benefit from the coordinated team approach during an Inpatient Acute Rehabilitation admission.  The patient will receive intensive therapy as well as Rehabilitation physician, nursing, social worker, and care management interventions.  Due to bladder management, bowel management, safety, skin/wound care, disease management, medication administration, pain management, and patient education the patient requires 24 hour a day rehabilitation nursing.  The patient is currently Mod assist overall with mobility and basic ADLs.  Discharge setting and therapy post discharge at home with outpatient is anticipated.  Patient has agreed to participate in the Acute Inpatient Rehabilitation Program and will admit today.   Preadmission Screen Completed By:  Cleatrice Burke, 12/25/2021 10:59 AM ______________________________________________________________________   Discussed status with Dr. Naaman Plummer on  12/25/21 at 1053 and received  approval for admission today.   Admission Coordinator:  Cleatrice Burke, RN, time  2440 Date 12/25/2021    Assessment/Plan: Diagnosis: CVA Does the need for close, 24 hr/day Medical supervision in concert with the patient's rehab needs make it unreasonable for this patient to be served in a less intensive setting? Yes Co-Morbidities requiring supervision/potential complications: CAD/CABG, BPH, prostate cancer Due to bladder management, bowel management,  safety, skin/wound care, disease management, medication administration, pain management, and patient education, does the patient require 24 hr/day rehab nursing? Yes Does the patient require coordinated care of a physician, rehab nurse, PT, OT, and SLP to address physical and functional deficits in the context of the above medical diagnosis(es)? Yes Addressing deficits in the following areas: balance, endurance, locomotion, strength, transferring, bowel/bladder control, bathing, dressing, feeding, grooming, toileting, cognition, speech, and psychosocial support Can the patient actively participate in an intensive therapy program of at least 3 hrs of therapy 5 days a week? Yes The potential for patient to make measurable gains while on inpatient rehab is excellent Anticipated functional outcomes upon discharge from inpatient rehab: supervision and min assist PT, supervision and min assist OT, supervision and min assist SLP Estimated rehab length of stay to reach the above functional goals is: 14-20 days Anticipated discharge destination: Home 10. Overall Rehab/Functional Prognosis: excellent     MD Signature: Meredith Staggers, MD, [place] Director Rehabilitation Services 12/25/2021          Revision History                                    Note Details  Author Meredith Staggers, MD File Time 12/25/2021 11:11 AM  Author Type Physician Status Signed  Last Editor Meredith Staggers, MD Service Physical Medicine and [place] # [id number] Admit Date 12/25/2021

## 2021-12-26 DIAGNOSIS — R451 Restlessness and agitation: Secondary | ICD-10-CM

## 2021-12-26 LAB — CBC WITH DIFFERENTIAL/PLATELET
Abs Immature Granulocytes: 0.03 10*3/uL (ref 0.00–0.07)
Basophils Absolute: 0.1 10*3/uL (ref 0.0–0.1)
Basophils Relative: 1 %
Eosinophils Absolute: 0.4 10*3/uL (ref 0.0–0.5)
Eosinophils Relative: 10 %
HCT: 37.2 % — ABNORMAL LOW (ref 39.0–52.0)
Hemoglobin: 12.3 g/dL — ABNORMAL LOW (ref 13.0–17.0)
Immature Granulocytes: 1 %
Lymphocytes Relative: 18 %
Lymphs Abs: 0.8 10*3/uL (ref 0.7–4.0)
MCH: 30.1 pg (ref 26.0–34.0)
MCHC: 33.1 g/dL (ref 30.0–36.0)
MCV: 91.2 fL (ref 80.0–100.0)
Monocytes Absolute: 0.5 10*3/uL (ref 0.1–1.0)
Monocytes Relative: 12 %
Neutro Abs: 2.6 10*3/uL (ref 1.7–7.7)
Neutrophils Relative %: 58 %
Platelets: 247 10*3/uL (ref 150–400)
RBC: 4.08 MIL/uL — ABNORMAL LOW (ref 4.22–5.81)
RDW: 14.4 % (ref 11.5–15.5)
WBC: 4.4 10*3/uL (ref 4.0–10.5)
nRBC: 0 % (ref 0.0–0.2)

## 2021-12-26 LAB — COMPREHENSIVE METABOLIC PANEL
ALT: 30 U/L (ref 0–44)
AST: 20 U/L (ref 15–41)
Albumin: 2.4 g/dL — ABNORMAL LOW (ref 3.5–5.0)
Alkaline Phosphatase: 83 U/L (ref 38–126)
Anion gap: 8 (ref 5–15)
BUN: 17 mg/dL (ref 8–23)
CO2: 27 mmol/L (ref 22–32)
Calcium: 8.4 mg/dL — ABNORMAL LOW (ref 8.9–10.3)
Chloride: 100 mmol/L (ref 98–111)
Creatinine, Ser: 0.68 mg/dL (ref 0.61–1.24)
GFR, Estimated: >60 mL/min (ref >60)
Glucose, Bld: 119 mg/dL — ABNORMAL HIGH (ref 70–99)
Potassium: 4.1 mmol/L (ref 3.5–5.1)
Sodium: 135 mmol/L (ref 135–145)
Total Bilirubin: 0.3 mg/dL (ref 0.3–1.2)
Total Protein: 6.1 g/dL — ABNORMAL LOW (ref 6.5–8.1)

## 2021-12-26 LAB — GLUCOSE, CAPILLARY
Glucose-Capillary: 102 mg/dL — ABNORMAL HIGH (ref 70–99)
Glucose-Capillary: 101 mg/dL — ABNORMAL HIGH (ref 70–99)
Glucose-Capillary: 114 mg/dL — ABNORMAL HIGH (ref 70–99)
Glucose-Capillary: 103 mg/dL — ABNORMAL HIGH (ref 70–99)
Glucose-Capillary: 104 mg/dL — ABNORMAL HIGH (ref 70–99)

## 2021-12-26 MED ORDER — JEVITY 1.5 CAL/FIBER PO LIQD
1000.0000 mL | ORAL | Status: DC
  Administered 2021-12-26 – 2021-12-27 (×2): 1000 mL
  Filled 2021-12-26 (×4): qty 1000

## 2021-12-26 MED ORDER — OLANZAPINE 5 MG PO TBDP
2.5000 mg | ORAL_TABLET | Freq: Every day | ORAL | Status: DC | PRN
  Filled 2021-12-26 (×2): qty 0.5

## 2021-12-26 MED ORDER — OLANZAPINE 5 MG PO TBDP
2.5000 mg | ORAL_TABLET | Freq: Every day | ORAL | Status: DC
  Administered 2021-12-26 – 2021-12-27 (×2): 2.5 mg via SUBLINGUAL
  Filled 2021-12-26 (×2): qty 0.5

## 2021-12-26 NOTE — Evaluation (Signed)
Physical Therapy Assessment and Plan  Patient Details  Name: [name] MRN: [id number] Date of Birth: 1946/04/08  PT Diagnosis: Abnormal posture, Abnormality of gait, Ataxia, Cognitive deficits, Coordination disorder, Dizziness and giddiness, Hypertonia, and Muscle weakness Rehab Potential: Good ELOS: 3-4 weeks   Today's Date: 12/26/2021 PT Individual Time: 1105-1220 PT Individual Time Calculation (min): 75 min    Hospital Problem: Principal Problem:   Cerebellar infarction [facility])   Past Medical History:  Past Medical History:  Diagnosis Date   CAD (coronary artery disease)    a. s/p CABG in 2012   Hyperlipidemia    Past Surgical History:  Past Surgical History:  Procedure Laterality Date   CORONARY ARTERY BYPASS GRAFT  2012   ESOPHAGOGASTRODUODENOSCOPY N/A 12/13/2021   Procedure: ESOPHAGOGASTRODUODENOSCOPY (EGD);  Surgeon: Jesusita Oka, MD;  Location: [facility] ENDOSCOPY;  Service: General;  Laterality: N/A;   PEG PLACEMENT N/A 12/13/2021   Procedure: PERCUTANEOUS ENDOSCOPIC GASTROSTOMY (PEG) PLACEMENT;  Surgeon: Jesusita Oka, MD;  Location: [facility] ENDOSCOPY;  Service: General;  Laterality: N/A;   SUBOCCIPITAL CRANIECTOMY CERVICAL LAMINECTOMY N/A 11/26/2021   Procedure: SUBOCCIPITAL CRANIECTOMY;  Surgeon: Judith Part, MD;  Location: [place];  Service: Neurosurgery;  Laterality: N/A;    Assessment & Plan Clinical Impression: Patient is a 76 y.o. year old right-handed male with history of CAD status post CABG x4 2012 in [place] maintained on low-dose aspirin, hyperlipidemia, nephrolithiasis, BPH/prostate cancer.  Per chart review patient lives with spouse in [place].  Independent prior to admission working part-time.  Wife can assist as needed.  1 level home with 2 steps to entry.  Presented 11/25/2021 with altered mental status as well as dizziness with ataxic gait and diaphoretic.  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  Negative for  hemorrhage.  He did receive TNKase.  CT angiogram head and neck negative for intracranial large vessel occlusion.  No significant basilar stenosis or thrombus.  Follow-up MRI showed interval progression of edema associated to bilateral cerebellar acute infarct with mass-effect on the fourth ventricle and low-lying right cerebellar tonsil.  No hydrocephalus.  EKG showed sinus rhythm To ECGs and atrial flutter with rapid ventricular rates on the other ECG.  Echocardiogram with ejection fraction of 70 to 75% no wall motion abnormalities grade 1 diastolic dysfunction.  Cardiology services consulted for atrial flutter RVR placed on intravenous Cardizem.  Neurosurgery consulted in regards to cerebellar stroke increased intracranial pressure underwent suboccipital decompressive craniectomy 11/26/2021 per Dr. Venetia Constable.  Patient did remain intubated for prolonged time underwent tracheostomy 12/10/2021 per Dr.Chand As well as PEG tube placement 12/13/2021 per Dr. [name].  Hospital course complicated by aspiration pneumonia/right lower lobe pneumonia completing antibiotic course.  Tracheostomy tube was downsized to a #6 cuffless Shiley 12/24/2021.  He was later cleared to begin Eliquis for new onset atrial fibrillation 12/17/2021.  Bouts of urinary retention with history of BPH maintained on Urecholine.  He remains n.p.o. with alternative means of nutritional support.  Hyperactive delirium placed on low-dose Zyprexa.  Therapy evaluations completed due to patient decreased functional mobility was admitted for a comprehensive rehab program.Patient transferred to [facility] on 12/25/2021 .   Patient currently requires max with mobility secondary to muscle weakness, decreased cardiorespiratoy endurance, impaired timing and sequencing, abnormal tone, ataxia, decreased coordination, and decreased motor planning, decreased visual perceptual skills and decreased visual motor skills, decreased initiation, decreased attention, decreased  awareness, decreased problem solving, decreased safety awareness, decreased memory, and delayed processing, and decreased sitting balance, decreased standing  balance, decreased postural control, decreased balance strategies, and difficulty maintaining precautions.  Prior to hospitalization, patient was independent  with mobility and lived with Spouse in a House home.  Home access is 2Stairs to enter.  Patient will benefit from skilled PT intervention to maximize safe functional mobility, minimize fall risk, and decrease caregiver burden for planned discharge home with 24 hour assist.  Anticipate patient will benefit from follow up OP at discharge.  PT - End of Session Activity Tolerance: Tolerates 10 - 20 min activity with multiple rests Endurance Deficit: Yes Endurance Deficit Description: generalized deconditioning PT Assessment Rehab Potential (ACUTE/IP ONLY): Good PT Barriers to Discharge: [place] home environment;Decreased caregiver support;Incontinence;Behavior;Nutrition means;Home environment access/layout PT Patient demonstrates impairments in the following area(s): Balance;Perception;Behavior;Safety;Sensory;Edema;Endurance;Skin Integrity;Motor;Nutrition;Pain PT Transfers Functional Problem(s): Bed Mobility;Bed to Chair;Car;Furniture PT Locomotion Functional Problem(s): Ambulation;Wheelchair Mobility;Stairs PT Plan PT Intensity: Minimum of 1-2 x/day ,45 to 90 minutes PT Frequency: 5 out of 7 days PT Duration Estimated Length of Stay: 3-4 weeks PT Treatment/Interventions: Ambulation/gait training;Cognitive remediation/compensation;Discharge planning;DME/adaptive equipment instruction;Functional mobility training;Pain management;Psychosocial support;Splinting/orthotics;Therapeutic Activities;UE/LE Strength taining/ROM;Visual/perceptual remediation/compensation;Wheelchair propulsion/positioning;UE/LE Coordination activities;Therapeutic Exercise;Stair training;Skin care/wound  management;Patient/family education;Neuromuscular re-education;Functional electrical stimulation;Disease management/prevention;Community reintegration;Balance/vestibular training PT Transfers Anticipated Outcome(s): CGA using LRAD PT Locomotion Anticipated Outcome(s): CGA using LRAD PT Recommendation Recommendations for Other Services: Neuropsych consult (when appropriate) Follow Up Recommendations: Outpatient PT Patient destination: Home Equipment Recommended: To be determined   PT Evaluation Precautions/Restrictions Precautions Precautions: Fall Precaution Comments: trach, peg, B wrist restraints Restrictions Weight Bearing Restrictions: No Pain Interference Pain Interference Pain Effect on Sleep: 8. Unable to answer Pain Interference with Therapy Activities: 8. Unable to answer Pain Interference with Day-to-Day Activities: 8. Unable to answer Home Living/Prior Functioning Home Living Available Help at Discharge: Family Type of Home: House Home Access: Stairs to enter Technical brewer of Steps: 2 Entrance Stairs-Rails: Right Home Layout: One level Bathroom Shower/Tub: Multimedia programmer: Standard Bathroom Accessibility: Yes  Lives With: Spouse Prior Function Level of Independence: Independent with basic ADLs;Independent with gait;Independent with transfers  Able to Take Stairs?: Yes Driving: Yes Vocation: Full time employment Vision/Perception  Vision - History Ability to See in Adequate Light: 1 Impaired Vision - Assessment Eye Alignment: Within Functional Limits Ocular Range of Motion: Impaired-to be further tested in functional context (appeared to have limited lateral motion of R) Alignment/Gaze Preference: Within Defined Limits Tracking/Visual Pursuits: Decreased smoothness of vertical tracking;Decreased smoothness of horizontal tracking;Impaired - to be further tested in functional context;Requires cues, head turns, or add eye shifts to track (L  eye tracks slower than the R) Saccades: Decreased speed of saccadic movement Convergence: Impaired - to be further tested in functional context Perception Perception: Impaired Praxis Praxis: Impaired Praxis Impairment Details: Initiation  Cognition Overall Cognitive Status: Impaired/Different from baseline Arousal/Alertness: Awake/alert Orientation Level: Oriented to person;Oriented to place Year: 2022 Month: January Day of Week:  (unable) Attention: Focused Focused Attention: Impaired Focused Attention Impairment: Verbal basic;Functional basic Sustained Attention: Impaired Sustained Attention Impairment: Verbal basic;Functional basic Memory: Impaired Memory Impairment: Storage deficit;Decreased recall of new information;Decreased short term memory Decreased Short Term Memory: Verbal basic;Functional basic Immediate Memory Recall:  (unable to complete) Awareness: Impaired Awareness Impairment: Intellectual impairment Problem Solving: Impaired Problem Solving Impairment: Verbal basic;Functional basic Executive Function:  (all impaired due to lower level deficits) Behaviors: Impulsive Safety/Judgment: Impaired Comments: Difficult to formally assess cognition d/t lethargy Sensation Sensation Light Touch: Appears Intact (will test again when pt more alert- he responded to touch on 4 limbs) Hot/Cold: Appears Intact Proprioception: Impaired  by gross assessment Coordination Gross Motor Movements are Fluid and Coordinated: No Fine Motor Movements are Fluid and Coordinated: No Coordination and Movement Description: Mild increased tone in all extremities, BLE clonus, discoordination throughout, decreased motor planning and initiation Heel Shin Test: unable to follow cues for testing Motor  Motor Motor: Abnormal postural alignment and control;Ataxia;Clonus Motor - Skilled Clinical Observations: Generalized weakness/deconditioning, ataxia   Trunk/Postural Assessment  Cervical  Assessment Cervical Assessment: Exceptions to [facility] (forward flexed head) Thoracic Assessment Thoracic Assessment: Exceptions to [facility] (rounded shoulders and thoracic spine in sitting) Lumbar Assessment Lumbar Assessment: Exceptions to [facility] (compensatory posterior pelvic tilt) Postural Control Postural Control: Deficits on evaluation Head Control: diminished Trunk Control: decreased/delayed Righting Reactions: decreased/delayed  Balance Balance Balance Assessed: Yes Static Sitting Balance Static Sitting - Balance Support: Right upper extremity supported;Left upper extremity supported Static Sitting - Level of Assistance: 5: Stand by assistance Dynamic Sitting Balance Dynamic Sitting - Balance Support: Right upper extremity supported;Left upper extremity supported Dynamic Sitting - Level of Assistance: 4: Min assist Static Standing Balance Static Standing - Balance Support: During functional activity;Bilateral upper extremity supported Static Standing - Level of Assistance: 3: Mod assist Dynamic Standing Balance Dynamic Standing - Balance Support: During functional activity;Bilateral upper extremity supported Dynamic Standing - Level of Assistance: 3: Mod assist;2: Max assist Extremity Assessment  RUE Assessment RUE Assessment: Exceptions to [facility] General Strength Comments: Difficult to assess, will continue to evaluate LUE Assessment LUE Assessment: Exceptions to [facility] General Strength Comments: Difficult to assess, will continue to evaluate RLE Assessment Active Range of Motion (AROM) Comments: tight hamstrings General Strength Comments: Grossly 4+-5/5 throughout in sitting RLE Tone RLE Tone: Mild;Hypertonic Hypertonic Details: +clonus, fatigues after 2 beats LLE Assessment LLE Assessment: Exceptions to [facility] Active Range of Motion (AROM) Comments: tight hamstrings General Strength Comments: Grossly 4+-5/5 throughout LLE Tone LLE Tone: Mild;Hypertonic Hypertonic Details: +clonus,  fatigues after 3 beats  Care Tool Care Tool Bed Mobility Roll left and right activity   Roll left and right assist level: Maximal Assistance - Patient 25 - 49%    Sit to lying activity   Sit to lying assist level: Moderate Assistance - Patient 50 - 74%    Lying to sitting on side of bed activity   Lying to sitting on side of bed assist level: the ability to move from lying on the back to sitting on the side of the bed with no back support.: Moderate Assistance - Patient 50 - 74%     Care Tool Transfers Sit to stand transfer   Sit to stand assist level: Maximal Assistance - Patient 25 - 49%    Chair/bed transfer   Chair/bed transfer assist level: Moderate Assistance - Patient 50 - 74%     Toilet transfer Toilet transfer activity did not occur: Safety/medical concerns      Scientist, product/process development transfer activity did not occur: Safety/medical concerns        Care Tool Locomotion Ambulation Ambulation activity did not occur: Safety/medical concerns        Walk 10 feet activity Walk 10 feet activity did not occur: Safety/medical concerns       Walk 50 feet with 2 turns activity Walk 50 feet with 2 turns activity did not occur: Safety/medical concerns      Walk 150 feet activity Walk 150 feet activity did not occur: Safety/medical concerns      Walk 10 feet on uneven surfaces activity Walk 10 feet on uneven surfaces activity did not occur: Safety/medical  concerns      Stairs Stair activity did not occur: Safety/medical concerns        Walk up/down 1 step activity Walk up/down 1 step or curb (drop down) activity did not occur: Safety/medical concerns      Walk up/down 4 steps activity Walk up/down 4 steps activity did not occur: Safety/medical concerns      Walk up/down 12 steps activity Walk up/down 12 steps activity did not occur: Safety/medical concerns      Pick up small objects from floor Pick up small object from the floor (from standing position) activity did not  occur: Safety/medical concerns      Wheelchair     Wheelchair activity did not occur: Safety/medical concerns (limited by cognitive deficits)      Wheel 50 feet with 2 turns activity Wheelchair 50 feet with 2 turns activity did not occur: Safety/medical concerns    Wheel 150 feet activity Wheelchair 150 feet activity did not occur: Safety/medical concerns      Refer to Care Plan for Long Term Goals  SHORT TERM GOAL WEEK 1 PT Short Term Goal 1 (Week 1): Patient will perform bed mobility with mod A in a flat bed without rails. PT Short Term Goal 2 (Week 1): Patient will perform basic transfers with mod A consistently. PT Short Term Goal 3 (Week 1): Patient will initiate gait training >10 feet.  Recommendations for other services: Neuropsych (when appropriate)  Skilled Therapeutic Intervention Evaluation completed (see details above and below) with education on PT POC and goals and individual treatment initiated with focus on functional mobility/transfers, LE strength, dynamic standing balance/coordination, ambulation, stair navigation, simulated car transfers, and improved endurance with activity Patient provided with 18"x18" TIS wheelchair with Roho cushion and adjustments made to promote optimal seating posture and pressure distribution.   Patient in bed asleep with his wife in the room upon PT arrival. Patient aroused to verbal and tactile stimulation and agreeable to PT session. Patient denied pain during session. PMSV donned throughout session. Patient oriented to self and location, "hospital," and able to identify the month, but stated the year as "20-22nd." Patient verbalized in short 1-3 word phrases with intermittent language of confusion vs expressive aphasia. He was able to follow simple 1 and 2 step commands throughout session, however, requires increased time for initiation due to motor planning deficits. Patient was impulsive and did remove his pulse ox x1 and pull on his trach  collar x1, easily redirectable, but needs close monitoring when sitting idle.   Tube feed paused and disconnected for mobility during session. Donned abdominal binder during rolling with total A. Continuous pulse ox donned throughout session, HR 55-65 bpm, SPO2 >92% when good waveform present with no signs of SOB or distress throughout session, FiO2 28% via trach collar.   Therapeutic Activity: Bed Mobility: Patient performed rolling R/L with mod-max A for initiation and coordination on opposite limb management. He performed supine to/from sit with mod quickly progressing to min A once mobility was initiated. Provided multimodal cues for initiation and increased time to complete mobility. Patient sat EOB >4 min with supervision for static sitting balance and dynamic balance within BOS, and min A for reaching forward x2.  Transfers: Patient performed sit to/from stand x1 with max A and incomplete full extension with poor control on descent. He then performed sit to stand followed by a pivot step with TIS w/c with mod A. Provided verbal cues for foot placement, forward weight shift, and safety awareness due  to impulsivity and inappropriate force production. Patient reported dizziness following transfer, BP 163/141, HR 53. TIS placed in full recline x2 min, BP 121/67, HR 55 with dizziness resolved.  Patient transferred back to bed using a Stedy with min-mod A and max cues for placement, sequencing, and use of device due to poor motor planning. Performed well with short-simple commands with a novel device/task.   Spent increased time educating patient and his wife in results of PT evaluation as detailed above, PT POC, rehab potential, rehab goals, and discharge recommendations. Additionally discussed [facility]'s policies regarding fall safety and use of chair alarm and/or quick release belt. Patient and his wife verbalized understanding and in agreement.   Patient in bed with his wife at bedside with [facility] >30 deg, B  soft wrist restraints secured, and tube feed running at end of session with breaks locked, bed alarm set, and all needs within reach.    Discharge Criteria: Patient will be discharged from PT if patient refuses treatment 3 consecutive times without medical reason, if treatment goals not met, if there is a change in medical status, if patient makes no progress towards goals or if patient is discharged from hospital.  The above assessment, treatment plan, treatment alternatives and goals were discussed and mutually agreed upon: by family  Doreene Burke PT, DPT, CBIS 12/26/2021, 4:06 PM

## 2021-12-26 NOTE — Progress Notes (Signed)
PROGRESS NOTE   Subjective/Complaints: Pt felt dizzy when getting up with OT. Was better second time up. Indicates that he slept.   ROS: Limited due to cognitive/behavioral    Objective:   DG Chest Port 1 View  Result Date: 12/25/2021 CLINICAL DATA:  Difficulty breathing EXAM: PORTABLE CHEST 1 VIEW COMPARISON:  Previous studies including the examination of 12/18/2021 FINDINGS: Transverse diameter of heart is increased. Central pulmonary vessels are less prominent. Tip of tracheostomy is proximally 5.4 cm above the carina. There is evidence of previous coronary bypass surgery. There is interval improvement in aeration of both lower lung fields. There are small linear residual densities in the lower lung fields suggesting subsegmental atelectasis, more so on the left side. Left lateral CP angle is indistinct. There is no pneumothorax. IMPRESSION: Central pulmonary vessels are less prominent. There are no signs of alveolar pulmonary edema. There is improvement in aeration of both lower lung fields. Residual linear densities in the lower lung fields suggest subsegmental atelectasis. Possible small left pleural effusion. Electronically Signed   By: Elmer Picker M.D.   On: 12/25/2021 08:33   Recent Labs    12/25/21 0453 12/26/21 0538  WBC 4.7 4.4  HGB 13.4 12.3*  HCT 40.5 37.2*  PLT 240 247   Recent Labs    12/25/21 0453 12/26/21 0538  NA 136 135  K 4.1 4.1  CL 97* 100  CO2 32 27  GLUCOSE 124* 119*  BUN 19 17  CREATININE 0.82 0.68  CALCIUM 8.6* 8.4*    Intake/Output Summary (Last 24 hours) at 12/26/2021 0859 Last data filed at 12/25/2021 1700 Gross per 24 hour  Intake 0 ml  Output --  Net 0 ml        Physical Exam: Vital Signs Blood pressure 129/68, pulse (!) 54, temperature 97.8 F (36.6 C), temperature source Oral, resp. rate 18, height 5\' 10"  (1.778 m), weight 84.6 kg, SpO2 94 %.  General: Alert and  oriented x 3, No apparent distress HEENT: Head is normocephalic, atraumatic, PERRLA, EOMI, sclera anicteric, oral mucosa pink and moist, dentition intact, ext ear canals clear,  Neck: #6 trach with clear secretions noted.  Heart: Reg rate and rhythm. No murmurs rubs or gallops Chest: a few rhonchi, no distress.  Abdomen: Soft, non-tender, non-distended, bowel sounds positive. +PEG site clean, dry Extremities: No clubbing, cyanosis, or edema. Pulses are 2+ Psych: Flat but cooperative Skin: occipital incision Clean and intact without signs of breakdown Neuro:  pt is awake, slowed. Oriented to self. Follows basic commands. Able to site eob but with poor,slumped posture. Difficulty controlling head. Speech is dysarthric, low volume. Moves all 4's at least antigravity. No resting tone. Sensory exam grossly intact Musculoskeletal: cerivcal tenderness.     Assessment/Plan: 1. Functional deficits which require 3+ hours per day of interdisciplinary therapy in a comprehensive inpatient rehab setting. Physiatrist is providing close team supervision and 24 hour management of active medical problems listed below. Physiatrist and rehab team continue to assess barriers to discharge/monitor patient progress toward functional and medical goals  Care Tool:  Bathing              Bathing assist  Upper Body Dressing/Undressing Upper body dressing        Upper body assist      Lower Body Dressing/Undressing Lower body dressing            Lower body assist       Toileting Toileting    Toileting assist       Transfers Chair/bed transfer  Transfers assist           Locomotion Ambulation   Ambulation assist              Walk 10 feet activity   Assist           Walk 50 feet activity   Assist           Walk 150 feet activity   Assist           Walk 10 feet on uneven surface  activity   Assist           Wheelchair     Assist                Wheelchair 50 feet with 2 turns activity    Assist            Wheelchair 150 feet activity     Assist          Blood pressure 129/68, pulse (!) 54, temperature 97.8 F (36.6 C), temperature source Oral, resp. rate 18, height 5\' 10"  (1.778 m), weight 84.6 kg, SpO2 94 %.    Medical Problem List and Plan: 1. 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  Status post posterior decompression with craniectomy 11/26/2021             -patient may not yet shower             -ELOS/Goals: 18-22 days, supervision to min assist goals  -Patient is beginning CIR therapies today including PT, OT, and SLP  2.  Antithrombotics: -DVT/anticoagulation:  Pharmaceutical: Other (comment) Eliquis             -antiplatelet therapy: N/A 3. Pain Management: Zanaflex 1 mg 3 times daily, Voltaren gel 4 times daily, Fioricet as needed, oxycodone as needed 4. Mood/sleep: Klonopin 0.5 mg nightly, melatonin 5 mg daily as needed             -antipsychotic agents: Zyprexa 5 mg nightly             -continue sleep chart  1/11-doesn't appear overly agitated. Will decrease zyprexa to 2.5mg  5. Neuropsych: This patient is not capable of making decisions on his own behalf.  -requires soft wrist restraints for safety/equipiment 6. Skin/Wound Care: Routine skin checks 7. Fluids/Electrolytes/Nutrition: Routine in and outs with follow-up chemistries             -continue current TF  -protein supplementation thru peg for low albumin 8.  Prolonged ventilatory support.  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   -Patient downsized to a #6 cuffless Shiley 12/24/2021--tolerating   -sxn prn -PMV trials with SLP 9.  Dysphagia.  Status post gastrostomy tube 12/13/2021 per Dr.Lovick. -advance per speech therapy 10.  Aspiration pneumonia.  Antibiotic therapy completed.  -afebrile  -aspiration precautions 11.  New onset  atrial fibrillation.  Started Eliquis 12/17/2021.  Follow-up carotid service.  Continue low-dose beta-blocker.  1/11 HR in 50's 12.  Hypertension.  Norvasc 10 mg daily, Cozaar 50 mg daily, , clonidine 0.1 mg every 8 hours.  Monitor with  increased mobility 13.  Hyperlipidemia.  Lipitor 14.  History of BPH/urinary retention.  Urecholine 10 mg 3 times daily  -voiding trial  -check PVR's 15.  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  Follow-up per cardiology services.     LOS: 1 days A FACE TO FACE EVALUATION WAS PERFORMED  Meredith Staggers 12/26/2021, 8:59 AM

## 2021-12-26 NOTE — Progress Notes (Signed)
Initial Nutrition Assessment  DOCUMENTATION CODES:   Not applicable  INTERVENTION:  Increase Jevity 1.5 cal formula via PEG to new goal rate of 65 ml/hr x 20 hours (may hold TF for up to 4 hours for therapy).  Provide 90 ml Prosource TF BID per tube.   Provide free water flushes of 250 ml q 6 hours per tube.   Tube feeding regimen to provide 2110 kcal, 127 grams protein, 1988 ml free water.   NUTRITION DIAGNOSIS:   Inadequate oral intake related to inability to eat as evidenced by NPO status.  GOAL:   Patient will meet greater than or equal to 90% of their needs  MONITOR:   Labs, Weight trends, TF tolerance, Skin, I & O's  REASON FOR ASSESSMENT:   Consult Enteral/tube feeding initiation and management  ASSESSMENT:   76 year old male with history of CAD status post CABG x4 2012, hyperlipidemia, nephrolithiasis, BPH/prostate cancer. Presented 11/25/2021 with altered mental status as well as dizziness with ataxic gait and diaphoretic. 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 Pt underwent suboccipital decompressive craniectomy 11/26/2021. Pt intubated for prolonged time underwent tracheostomy 12/10/2021 and PEG tube placement 12/13/2021. Pt with decreased functional mobility and admitted to CIR.  Pt currently on trach collar. Pt continues on NPO status. Son at bedside reports pt has been tolerating his tube feeds well with no difficulties. Pt currently has Jevity 1.5 cal formula infusing at rate of 55 ml/hr x 20 hours with Prosource TF 90 ml BID which provides 1810 kcal (86% of kcal needs). RD to modify tube feeding orders to better meet nutrition needs.   NUTRITION - FOCUSED PHYSICAL EXAM:  Flowsheet Row Most Recent Value  Orbital Region No depletion  Upper Arm Region No depletion  Thoracic and Lumbar Region No depletion  Buccal Region No depletion  Temple Region No depletion  Clavicle Bone Region No depletion  Clavicle and Acromion Bone Region No  depletion  Scapular Bone Region Unable to assess  Dorsal Hand No depletion  Patellar Region Mild depletion  Anterior Thigh Region Mild depletion  Posterior Calf Region Mild depletion  Edema (RD Assessment) Mild  Hair Reviewed  Eyes Reviewed  Mouth Reviewed  Skin Reviewed  Nails Reviewed      Labs and medications reviewed.   Diet Order:   Diet Order             Diet NPO time specified  Diet effective midnight                   EDUCATION NEEDS:   Not appropriate for education at this time  Skin:  Skin Assessment: Skin Integrity Issues: Skin Integrity Issues:: Incisions Incisions: head  Last BM:  1/11  Height:   Ht Readings from Last 1 Encounters:  12/25/21 5\' 10"  (1.778 m)    Weight:   Wt Readings from Last 1 Encounters:  12/25/21 84.6 kg   BMI:  Body mass index is 26.76 kg/m.  Estimated Nutritional Needs:   Kcal:  2100-2300  Protein:  115-125 grams  Fluid:  >/= 2 L/day  Corrin Parker, MS, RD, LDN RD pager number/after hours weekend pager number on Amion.

## 2021-12-26 NOTE — Evaluation (Signed)
Speech Language Pathology Assessment and Plan  Patient Details  Name: [name] MRN: [id number] Date of Birth: February 28, 1946  SLP Diagnosis: Speech and Language deficits;Cognitive Impairments  Rehab Potential: Excellent ELOS: 3-4 weeks    Today's Date: 12/26/2021 SLP Individual Time: 0930-1030 SLP Individual Time Calculation (min): 72 min   Hospital Problem: Principal Problem:   Cerebellar infarction [facility])  Past Medical History:  Past Medical History:  Diagnosis Date   CAD (coronary artery disease)    a. s/p CABG in 2012   Hyperlipidemia    Past Surgical History:  Past Surgical History:  Procedure Laterality Date   CORONARY ARTERY BYPASS GRAFT  2012   ESOPHAGOGASTRODUODENOSCOPY N/A 12/13/2021   Procedure: ESOPHAGOGASTRODUODENOSCOPY (EGD);  Surgeon: Jesusita Oka, MD;  Location: [facility] ENDOSCOPY;  Service: General;  Laterality: N/A;   PEG PLACEMENT N/A 12/13/2021   Procedure: PERCUTANEOUS ENDOSCOPIC GASTROSTOMY (PEG) PLACEMENT;  Surgeon: Jesusita Oka, MD;  Location: [facility] ENDOSCOPY;  Service: General;  Laterality: N/A;   SUBOCCIPITAL CRANIECTOMY CERVICAL LAMINECTOMY N/A 11/26/2021   Procedure: SUBOCCIPITAL CRANIECTOMY;  Surgeon: Judith Part, MD;  Location: [place];  Service: Neurosurgery;  Laterality: N/A;    Assessment / Plan / Recommendation Clinical Impression Patient is a 76 year old right-handed male with history of CAD status post CABG x4 2012, hyperlipidemia, nephrolithiasis, BPH/prostate cancer.   Presented 11/25/2021 with altered mental status as well as dizziness with ataxic gait and diaphoretic.  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  Negative for hemorrhage.  Follow-up MRI showed interval progression of edema associated to bilateral cerebellar acute infarct with mass-effect on the fourth ventricle and low-lying right cerebellar tonsil.  No hydrocephalus. Neurosurgery consulted in regards to cerebellar stroke with increased intracranial  pressure. Patient underwent suboccipital decompressive craniectomy 11/26/2021 per Dr. Venetia Constable.  Patient did remain intubated for prolonged time underwent tracheostomy 12/10/2021 per Dr.Chand as well as PEG tube placement 12/13/2021 per Dr. [name].  Hospital course complicated by aspiration pneumonia/right lower lobe pneumonia completing antibiotic course.  Tracheostomy tube was downsized to a #6 cuffless Shiley 12/24/2021.  He was later cleared to begin Eliquis for new onset atrial fibrillation 12/17/2021.   He remains n.p.o. with alternative means of nutritional support.  Therapy evaluations completed with recommendations for a comprehensive rehab program. Patient admitted 75/10/23.  Patient currently has #6 cuffless trach. SLP donned PMSV, it remained in place for ~45 minutes with all vitals remaining WFL. Patient was lethargic throughout and was intermittently responsive to questions but without any spontaneous verbalizations. Intelligibility was mildly reduced with multiple repetitions needed due to a low vocal intensity. Patient disoriented to place, time and situation but could answer basic biographical questions appropriately. Patient also able to follow basic commands. Patient required multiple verbal and tactile cues to stay awake throughout session. Due to lethargy, a BSE was not administered and SLP was not able to fully evaluate his cognitive-linguistic function. Patient's wife present and educated regarding goals of care. SLP also educated patient's wife on how to donn/doff PMSV, importance of only wearing PMSV when awake and parameters for removal. Patient's wife verbalized and demonstrated understanding. Recommend patient wear the PMSV during all waking hours with full staff or family supervision. Patient would benefit from skilled SLP intervention to maximize his cognitive-linguistic function and for a BSE when awake.    Skilled Therapeutic Interventions          Administered a PMSV evaluation and  cognitive-linguistic evaluation, please see above for details.  Patient lethargic upon arrival and would open his  eyes to verbal and tactile cues but then would quickly fall back to sleep. Patient transferred to EOB with +2 assist to maximize arousal. Patient remained lethargic with minimal verbal output. However, patient spontaneously requested to use the bathroom. Patient was transferred to the [facility] and was semi-continent of bladder. Patient transferred back to bed and again remained lethargic. Throughout transfer, patient appeared impulsive but was responsive to cues. Patient left supine in bed, PMSV removed, with restraints in place and family present. Continue with current plan of care.    SLP Assessment  Patient will need skilled Speech Lanaguage Pathology Services during CIR admission    Recommendations  Patient may use Passy-Muir Speech Valve: During all waking hours (remove during sleep);Caregiver trained to provide supervision PMSV Supervision: Full Oral Care Recommendations: Oral care QID Patient destination: Home Follow up Recommendations: Home Health SLP;Outpatient SLP;24 hour supervision/assistance Equipment Recommended: To be determined    SLP Frequency 3 to 5 out of 7 days   SLP Duration  SLP Intensity  SLP Treatment/Interventions 3-4 weeks  Minumum of 1-2 x/day, 30 to 90 minutes  Cognitive remediation/compensation;Internal/external aids;Speech/Language facilitation;Therapeutic Activities;Environmental controls;Cueing hierarchy;Functional tasks;Patient/family education    Pain Pain Assessment Pain Scale: 0-10 Pain Score: 0-No pain Faces Pain Scale: No hurt  Prior Functioning Type of Home: House  Lives With: Spouse Available Help at Discharge: Family Vocation: Full time employment  SLP Evaluation Cognition Overall Cognitive Status: Impaired/Different from baseline Arousal/Alertness: Lethargic Orientation Level: Oriented to person;Disoriented to  situation;Disoriented to place;Disoriented to time Year:  ("48") Month:  (unable) Day of Week:  (unable) Attention: Focused Focused Attention: Impaired Focused Attention Impairment: Verbal basic;Functional basic Sustained Attention: Impaired Sustained Attention Impairment: Verbal basic;Functional basic Memory: Impaired Memory Impairment: Storage deficit;Decreased recall of new information;Decreased short term memory Decreased Short Term Memory: Verbal basic;Functional basic Immediate Memory Recall:  (unable to complete) Awareness: Impaired Awareness Impairment: Intellectual impairment Problem Solving: Impaired Problem Solving Impairment: Verbal basic;Functional basic Executive Function:  (all impaired due to lower level deficits) Behaviors: Impulsive Safety/Judgment: Impaired Comments: Difficult to formally assess cognition d/t lethargy  Comprehension Auditory Comprehension Overall Auditory Comprehension: Appears within functional limits for tasks assessed Visual Recognition/Discrimination Discrimination: Not tested Reading Comprehension Reading Status: Not tested Expression Expression Primary Mode of Expression: Verbal Verbal Expression Overall Verbal Expression: Impaired Initiation: Impaired Automatic Speech: Name;Social Response Level of Generative/Spontaneous Verbalization: Word Interfering Components: Attention Written Expression Dominant Hand: Right Written Expression: Not tested Oral Motor Oral Motor/Sensory Function Overall Oral Motor/Sensory Function: Within functional limits Motor Speech Overall Motor Speech: Impaired Respiration: Impaired Phonation: Normal Resonance: Within functional limits Articulation: Within functional limitis Intelligibility: Intelligibility reduced Word: 75-100% accurate Phrase: 50-74% accurate Sentence: Not tested Conversation: Not tested Motor Planning: Witnin functional limits  Care Tool Care Tool Cognition Ability to hear  (with hearing aid or hearing appliances if normally used Ability to hear (with hearing aid or hearing appliances if normally used): 1. Minimal difficulty - difficulty in some environments (e.g. when person speaks softly or setting is noisy)   Expression of Ideas and Wants Expression of Ideas and Wants: 2. Frequent difficulty - frequently exhibits difficulty with expressing needs and ideas   Understanding Verbal and Non-Verbal Content Understanding Verbal and Non-Verbal Content: 2. Sometimes understands - understands only basic conversations or simple, direct phrases. Frequently requires cues to understand  Memory/Recall Ability Memory/Recall Ability : None of the above were recalled    Short Term Goals: Week 1: SLP Short Term Goal 1 (Week 1): Patient will sustain attention to functional tasks  for 5 minutes with Mod verbal cues for redirection. SLP Short Term Goal 2 (Week 1): Patient will initiate functional tasks in 50% of opportunities with Mod verbal cues. SLP Short Term Goal 3 (Week 1): Patient will verbalize wants/needs at the phrase level in 50% of opportunities with Mod verbal cues. SLP Short Term Goal 4 (Week 1): Patient will demonstrate orientaton to place, time and situation with Mod verbal and visual cues.  Refer to Care Plan for Long Term Goals  Recommendations for other services: None   Discharge Criteria: Patient will be discharged from SLP if patient refuses treatment 3 consecutive times without medical reason, if treatment goals not met, if there is a change in medical status, if patient makes no progress towards goals or if patient is discharged from hospital.  The above assessment, treatment plan, treatment alternatives and goals were discussed and mutually agreed upon: by family  Mirtie Bastyr, [place] 12/26/2021, 12:39 PM

## 2021-12-26 NOTE — Evaluation (Signed)
Occupational Therapy Assessment and Plan  Patient Details  Name: [name] MRN: [id number] Date of Birth: 17-Jun-1946  OT Diagnosis: ataxia, cognitive deficits, and muscle weakness (generalized) Rehab Potential: Rehab Potential (ACUTE ONLY): Good ELOS: 3-4 weeks   Today's Date: 12/26/2021 OT Individual Time: 2440-1027 OT Individual Time Calculation (min): 55 min     Hospital Problem: Principal Problem:   Cerebellar infarction [facility])   Past Medical History:  Past Medical History:  Diagnosis Date   CAD (coronary artery disease)    a. s/p CABG in 2012   Hyperlipidemia    Past Surgical History:  Past Surgical History:  Procedure Laterality Date   CORONARY ARTERY BYPASS GRAFT  2012   ESOPHAGOGASTRODUODENOSCOPY N/A 12/13/2021   Procedure: ESOPHAGOGASTRODUODENOSCOPY (EGD);  Surgeon: Jesusita Oka, MD;  Location: [facility] ENDOSCOPY;  Service: General;  Laterality: N/A;   PEG PLACEMENT N/A 12/13/2021   Procedure: PERCUTANEOUS ENDOSCOPIC GASTROSTOMY (PEG) PLACEMENT;  Surgeon: Jesusita Oka, MD;  Location: [place] ENDOSCOPY;  Service: General;  Laterality: N/A;   SUBOCCIPITAL CRANIECTOMY CERVICAL LAMINECTOMY N/A 11/26/2021   Procedure: SUBOCCIPITAL CRANIECTOMY;  Surgeon: Judith Part, MD;  Location: [place];  Service: Neurosurgery;  Laterality: N/A;    Assessment & Plan Clinical Impression: [name] is a 76 year old right-handed male with history of CAD status post CABG x4 2012 in [place] maintained on low-dose aspirin, hyperlipidemia, nephrolithiasis, BPH/prostate cancer.  Per chart review patient lives with spouse in [place].  Independent prior to admission working part-time.  Wife can assist as needed.  1 level home with 2 steps to entry.  Presented 11/25/2021 with altered mental status as well as dizziness with ataxic gait and diaphoretic.  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  Negative for hemorrhage.  He did receive TNKase.  CT angiogram  head and neck negative for intracranial large vessel occlusion.  No significant basilar stenosis or thrombus.  Follow-up MRI showed interval progression of edema associated to bilateral cerebellar acute infarct with mass-effect on the fourth ventricle and low-lying right cerebellar tonsil.  No hydrocephalus.  EKG showed sinus rhythm To ECGs and atrial flutter with rapid ventricular rates on the other ECG.  Echocardiogram with ejection fraction of 70 to 75% no wall motion abnormalities grade 1 diastolic dysfunction.  Cardiology services consulted for atrial flutter RVR placed on intravenous Cardizem.  Neurosurgery consulted in regards to cerebellar stroke increased intracranial pressure underwent suboccipital decompressive craniectomy 11/26/2021 per Dr. Venetia Constable.  Patient did remain intubated for prolonged time underwent tracheostomy 12/10/2021 per Dr.Chand As well as PEG tube placement 12/13/2021 per Dr. [name].  Hospital course complicated by aspiration pneumonia/right lower lobe pneumonia completing antibiotic course.  Tracheostomy tube was downsized to a #6 cuffless Shiley 12/24/2021.  He was later cleared to begin Eliquis for new onset atrial fibrillation 12/17/2021.  Bouts of urinary retention with history of BPH maintained on Urecholine.  He remains n.p.o. with alternative means of nutritional support.  Hyperactive delirium placed on low-dose Zyprexa.  Therapy evaluations completed due to patient decreased functional mobility was admitted for a comprehensive rehab program.  Patient transferred to CIR on 12/25/2021 .    Patient currently requires max with basic self-care skills secondary to muscle weakness, decreased cardiorespiratoy endurance, decreased visual acuity and decreased visual motor skills, decreased initiation, decreased attention, decreased awareness, decreased problem solving, decreased safety awareness, decreased memory, and delayed processing, and decreased sitting balance, decreased standing  balance, decreased postural control, and decreased balance strategies.  Prior to hospitalization, patient could complete ADLs  with independent .  Patient will benefit from skilled intervention to decrease level of assist with basic self-care skills prior to discharge home with care partner.  Anticipate patient will require 24 hour supervision and minimal physical assistance and follow up home health.  OT - End of Session Activity Tolerance: Tolerates < 10 min activity with changes in vital signs Endurance Deficit: Yes Endurance Deficit Description: generalized deconditioning OT Assessment Rehab Potential (ACUTE ONLY): Good OT Patient demonstrates impairments in the following area(s): Balance;Perception;Safety;Cognition;Sensory;Edema;Skin Integrity;Endurance;Vision;Motor;Nutrition;Pain OT Basic ADL's Functional Problem(s): Dressing;Toileting;Bathing;Grooming OT Transfers Functional Problem(s): Toilet;Tub/Shower OT Additional Impairment(s): None OT Plan OT Intensity: Minimum of 1-2 x/day, 45 to 90 minutes OT Frequency: 5 out of 7 days OT Duration/Estimated Length of Stay: 3-4 weeks OT Treatment/Interventions: Balance/vestibular training;Discharge planning;Pain management;Self Care/advanced ADL retraining;Therapeutic Activities;UE/LE Coordination activities;Visual/perceptual remediation/compensation;Therapeutic Exercise;Skin care/wound managment;Patient/family education;Functional mobility training;Disease mangement/prevention;Cognitive remediation/compensation;Community reintegration;DME/adaptive equipment instruction;Neuromuscular re-education;Psychosocial support;UE/LE Strength taining/ROM;Wheelchair propulsion/positioning OT Self Feeding Anticipated Outcome(s): no goal set OT Basic Self-Care Anticipated Outcome(s): (S) OT Toileting Anticipated Outcome(s): min A OT Bathroom Transfers Anticipated Outcome(s): min A OT Recommendation Patient destination: Home Follow Up Recommendations: Home  health OT Equipment Recommended: To be determined   OT Evaluation Precautions/Restrictions  Precautions Precautions: Fall Precaution Comments: trach, peg, B wrist restraints Restrictions Weight Bearing Restrictions: No General Chart Reviewed: Yes Family/Caregiver Present: No Vital Signs Therapy Vitals Pulse Rate: 62 Resp: 18 BP: 129/68 Patient Position (if appropriate): Sitting Oxygen Therapy SpO2: 93 % O2 Device: Tracheostomy Collar O2 Flow Rate (L/min): 5 L/min FiO2 (%): 28 % Pain Pain Assessment Pain Scale: 0-10 Pain Score: 0-No pain Faces Pain Scale: No hurt Home Living/Prior Functioning Home Living Living Arrangements: Spouse/significant other Available Help at Discharge: Family Type of Home: House Home Access: Stairs to enter Technical brewer of Steps: 2 Entrance Stairs-Rails: Right Home Layout: One level Bathroom Shower/Tub: Multimedia programmer: Associate Professor Accessibility: Yes  Lives With: Spouse IADL History Homemaking Responsibilities:  (pt unable to answer, no family present) Prior Function Vocation: Full time employment Vision Baseline Vision/History: 1 Wears glasses Ability to See in Adequate Light: 2 Moderately impaired (to be further assessed) Patient Visual Report: Other (comment) (pt unable to state) Vision Assessment?: Yes;Vision impaired- to be further tested in functional context Eye Alignment: Within Functional Limits Ocular Range of Motion: Impaired-to be further tested in functional context Alignment/Gaze Preference: Within Defined Limits Tracking/Visual Pursuits: Decreased smoothness of vertical tracking;Decreased smoothness of horizontal tracking;Impaired - to be further tested in functional context;Requires cues, head turns, or add eye shifts to track Saccades: Decreased speed of saccadic movement Convergence: Impaired - to be further tested in functional context Perception  Perception: Impaired (to be further  assessed when pt more alert) Praxis Praxis: Impaired Praxis Impairment Details: Initiation Cognition Overall Cognitive Status: Impaired/Different from baseline Arousal/Alertness: Lethargic Orientation Level: Person Year:  ("48") Month:  (unable) Day of Week:  (unable) Memory: Impaired Memory Impairment: Storage deficit;Decreased recall of new information;Decreased short term memory Decreased Short Term Memory: Verbal basic;Functional basic Immediate Memory Recall:  (unable to complete) Attention: Focused Focused Attention: Impaired Focused Attention Impairment: Verbal basic;Functional basic Sustained Attention: Impaired Sustained Attention Impairment: Verbal basic;Functional basic Awareness: Impaired Awareness Impairment: Intellectual impairment Problem Solving: Impaired Problem Solving Impairment: Verbal basic;Functional basic Executive Function:  (all impaired due to lower level deficits) Behaviors: Impulsive Safety/Judgment: Impaired Comments: Difficult to formally assess cognition d/t lethargy Sensation Sensation Light Touch: Appears Intact (will test again when pt more alert- he responded to touch on 4 limbs) Proprioception: Impaired by gross  assessment Coordination Gross Motor Movements are Fluid and Coordinated: No Fine Motor Movements are Fluid and Coordinated: No Coordination and Movement Description: Pt very lethargic so difficult to assess- possible ataxia in BUE and BLE clonus Motor  Motor Motor: Abnormal postural alignment and control;Ataxia Motor - Skilled Clinical Observations: Generalized weakness/deconditioning, ataxia  Trunk/Postural Assessment  Cervical Assessment Cervical Assessment: Exceptions to [facility] (forward flexed head) Thoracic Assessment Thoracic Assessment: Exceptions to [facility] (rounded shoulders and thoracic spine in sitting) Lumbar Assessment Lumbar Assessment: Exceptions to [facility] (compensatory posterior pelvic tilt) Postural Control Postural  Control: Deficits on evaluation Head Control: diminished Trunk Control: diminished Righting Reactions: Absent  Balance Balance Balance Assessed: Yes Static Sitting Balance Static Sitting - Balance Support: Feet supported;Bilateral upper extremity supported Static Sitting - Level of Assistance: 4: Min assist;3: Mod assist Dynamic Sitting Balance Dynamic Sitting - Balance Support: Feet supported Dynamic Sitting - Level of Assistance: 3: Mod assist Static Standing Balance Static Standing - Balance Support: During functional activity Static Standing - Level of Assistance: 2: Max assist Dynamic Standing Balance Dynamic Standing - Balance Support: During functional activity Dynamic Standing - Level of Assistance: 2: Max assist Extremity/Trunk Assessment RUE Assessment RUE Assessment: Exceptions to [facility] General Strength Comments: Difficult to assess, will continue to evaluate LUE Assessment LUE Assessment: Exceptions to [facility] General Strength Comments: Difficult to assess, will continue to evaluate  Care Tool Care Tool Self Care Eating Eating activity did not occur: Safety/medical concerns (NPO)      Oral Care    Oral Care Assist Level: Maximal assistance - Patient 25 - 49%    Bathing   Body parts bathed by patient: Right arm;Left arm;Chest;Abdomen;Face Body parts bathed by helper: Front perineal area;Buttocks;Right upper leg;Left upper leg;Right lower leg;Left lower leg   Assist Level: Maximal Assistance - Patient 24 - 49%    Upper Body Dressing(including orthotics)   What is the patient wearing?: Hospital gown only   Assist Level: Total Assistance - Patient < 25%    Lower Body Dressing (excluding footwear)   What is the patient wearing?: Pants;Incontinence brief Assist for lower body dressing: 2 Helpers    Putting on/Taking off footwear   What is the patient wearing?: Non-skid slipper socks Assist for footwear: 2 Helpers       Care Tool Toileting Toileting activity    Assist for toileting: 2 Helpers     Care Tool Bed Mobility Roll left and right activity   Roll left and right assist level: Total Assistance - Patient < 25%    Sit to lying activity   Sit to lying assist level: Maximal Assistance - Patient 25 - 49%    Lying to sitting on side of bed activity   Lying to sitting on side of bed assist level: the ability to move from lying on the back to sitting on the side of the bed with no back support.: Maximal Assistance - Patient 25 - 49%     Care Tool Transfers Sit to stand transfer   Sit to stand assist level: Maximal Assistance - Patient 25 - 49%    Chair/bed transfer   Chair/bed transfer assist level: 2 Armed forces training and education officer transfer activity did not occur: Safety/medical concerns       Care Tool Cognition  Expression of Ideas and Wants Expression of Ideas and Wants: 2. Frequent difficulty - frequently exhibits difficulty with expressing needs and ideas  Understanding Verbal and Non-Verbal Content Understanding Verbal and Non-Verbal Content: 2. Sometimes understands -  understands only basic conversations or simple, direct phrases. Frequently requires cues to understand   Memory/Recall Ability Memory/Recall Ability : None of the above were recalled   Refer to Care Plan for Long Term Goals  SHORT TERM GOAL WEEK 1 OT Short Term Goal 1 (Week 1): Pt will attend to ADL task for 1 min with min cueing OT Short Term Goal 2 (Week 1): Pt will don shirt with min A OT Short Term Goal 3 (Week 1): Pt will complete toilet transfer with LRAD with mod A OT Short Term Goal 4 (Week 1): Pt will complete bimanual grooming tasks with min A  Recommendations for other services: None    Skilled Therapeutic Intervention Skilled OT evaluation completed. Pt very lethargic throughout session, undoubtedly impacting cognitive, visual, and ADL performance/evaluation. Family also not present and pt was unable to provide any history or PLOF information.  He did have great initiation of motor plans and ADL commands throughout session. Max A for ADLs overall. Pt with very forward flexed posture EOB despite cueing/assist. He was able to stand with max A. All vitals stable throughout session with one instance of dizziness reported EOB, unable to get BP reading still seated and once supine pt reported no c/o further. He was left supine with all needs met, wrists restrained. Trach collar on 5L 28% FiO2, continuous pulse oximeter on, and peg tube running.   ADL ADL Eating: NPO Grooming: Maximal assistance Where Assessed-Grooming: Standing at sink Upper Body Bathing: Maximal assistance Where Assessed-Upper Body Bathing: Edge of bed Lower Body Bathing: Dependent Where Assessed-Lower Body Bathing: Edge of bed;Bed level Upper Body Dressing: Maximal assistance Where Assessed-Upper Body Dressing: Edge of bed Lower Body Dressing: Dependent Where Assessed-Lower Body Dressing: Bed level Toileting: Dependent Where Assessed-Toileting: Bed level Toilet Transfer: Not assessed Tub/Shower Transfer: Unable to assess Mobility  Bed Mobility Bed Mobility: Rolling Right;Rolling Left Rolling Right: Maximal Assistance - Patient 25-49% Rolling Left: Maximal Assistance - Patient 25-49% Transfers Sit to Stand: Maximal Assistance - Patient 25-49% Stand to Sit: Maximal Assistance - Patient 25-49%   Discharge Criteria: Patient will be discharged from OT if patient refuses treatment 3 consecutive times without medical reason, if treatment goals not met, if there is a change in medical status, if patient makes no progress towards goals or if patient is discharged from hospital.  The above assessment, treatment plan, treatment alternatives and goals were discussed and mutually agreed upon: No family available/patient unable  [name] 12/26/2021, 12:47 PM

## 2021-12-26 NOTE — Progress Notes (Signed)
Occupational Therapy Session Note ° °Patient Details  °Name: [name] °MRN: [id number] °Date of Birth: 09/12/1946 ° °Today's Date: 12/26/2021 °OT Individual Time: 1430-1507 °OT Individual Time Calculation (min): 37 min  ° ° °Short Term Goals: °Week 1:  OT Short Term Goal 1 (Week 1): Pt will attend to ADL task for 1 min with min cueing °OT Short Term Goal 2 (Week 1): Pt will don shirt with min A °OT Short Term Goal 3 (Week 1): Pt will complete toilet transfer with LRAD with mod A °OT Short Term Goal 4 (Week 1): Pt will complete bimanual grooming tasks with min A ° °Skilled Therapeutic Interventions/Progress Updates:  °Pt supine, resting comfortably with his son Rob present. He was easily awoken, but still lethargic throughout session with frequent cueing required to maintain arousal. He was also extremely restless, fidgeting and pulling at all lines throughout session, redirection unsuccessful. He was found to be incontinent of urine. Transfer to EOB with mod A and mod cueing. He stood with max A and completed a stand pivot to the TIS with max A. Core ataxia present. Max A for peri hygiene and brief change sit <> stand from the TIS. He returned to the EOB with max A, max cueing for technique/transfer. He was left supine with all needs met, bed alarm set. Wrist restraints on, trach collar in place with PMSV removed- on 5L 28% FiO2, peg running, and continuous pulse oximeter on.  ° °Therapy Documentation °Precautions:  °Precautions °Precautions: Fall °Precaution Comments: trach, peg, B wrist restraints °Restrictions °Weight Bearing Restrictions: No ° ° °Therapy/Group: Individual Therapy ° ° H  °12/26/2021, 3:27 PM °

## 2021-12-27 LAB — GLUCOSE, CAPILLARY
Glucose-Capillary: 102 mg/dL — ABNORMAL HIGH (ref 70–99)
Glucose-Capillary: 126 mg/dL — ABNORMAL HIGH (ref 70–99)
Glucose-Capillary: 116 mg/dL — ABNORMAL HIGH (ref 70–99)
Glucose-Capillary: 126 mg/dL — ABNORMAL HIGH (ref 70–99)
Glucose-Capillary: 110 mg/dL — ABNORMAL HIGH (ref 70–99)

## 2021-12-27 MED ORDER — CLONAZEPAM 0.25 MG PO TBDP
0.2500 mg | ORAL_TABLET | Freq: Every day | ORAL | Status: DC
  Administered 2021-12-27 – 2021-12-29 (×3): 0.25 mg
  Filled 2021-12-27 (×3): qty 1

## 2021-12-27 NOTE — Progress Notes (Signed)
PROGRESS NOTE   Subjective/Complaints: Up in chair. Son at bedside. No new issues reported. Still with confusion. Trach secretions mild per nursing  ROS: Limited due to cognitive/behavioral    Objective:   No results found. Recent Labs    12/25/21 0453 12/26/21 0538  WBC 4.7 4.4  HGB 13.4 12.3*  HCT 40.5 37.2*  PLT 240 247   Recent Labs    12/25/21 0453 12/26/21 0538  NA 136 135  K 4.1 4.1  CL 97* 100  CO2 32 27  GLUCOSE 124* 119*  BUN 19 17  CREATININE 0.82 0.68  CALCIUM 8.6* 8.4*   No intake or output data in the 24 hours ending 12/27/21 1037       Physical Exam: Vital Signs Blood pressure 120/83, pulse 78, temperature 98.1 F (36.7 C), temperature source Oral, resp. rate 16, height 5\' 10"  (1.778 m), weight 86.3 kg, SpO2 97 %.  Constitutional: No distress . Vital signs reviewed. Wrist restraints HEENT: NCAT, EOMI, oral membranes moist Neck: #6 trach without secretions Cardiovascular: RRR without murmur. No JVD    Respiratory/Chest: CTA Bilaterally without wheezes or rales. Normal effort    GI/Abdomen: BS +, non-tender, non-distended, PEG site CDI Ext: no clubbing, cyanosis, or edema Psych: flat but restless Skin: occipital incision dry with scab Neuro:  pt is awake, slowed. Follows basic commands with cueing. Speech is dysarthric, low volume, difficult to understand. .Moves all 4's at least antigravity. No resting tone. Sensory exam grossly intact Musculoskeletal: cerivcal tenderness.     Assessment/Plan: 1. Functional deficits which require 3+ hours per day of interdisciplinary therapy in a comprehensive inpatient rehab setting. Physiatrist is providing close team supervision and 24 hour management of active medical problems listed below. Physiatrist and rehab team continue to assess barriers to discharge/monitor patient progress toward functional and medical goals  Care Tool:  Bathing     Body parts bathed by patient: Right arm, Left arm, Chest, Abdomen, Face   Body parts bathed by helper: Front perineal area, Buttocks, Right upper leg, Left upper leg, Right lower leg, Left lower leg     Bathing assist Assist Level: Maximal Assistance - Patient 24 - 49%     Upper Body Dressing/Undressing Upper body dressing   What is the patient wearing?: Hospital gown only    Upper body assist Assist Level: Total Assistance - Patient < 25%    Lower Body Dressing/Undressing Lower body dressing      What is the patient wearing?: Pants, Incontinence brief     Lower body assist Assist for lower body dressing: 2 Helpers     Toileting Toileting    Toileting assist Assist for toileting: 2 Helpers     Transfers Chair/bed transfer  Transfers assist     Chair/bed transfer assist level: Moderate Assistance - Patient 50 - 74%     Locomotion Ambulation   Ambulation assist   Ambulation activity did not occur: Safety/medical concerns          Walk 10 feet activity   Assist  Walk 10 feet activity did not occur: Safety/medical concerns        Walk 50 feet activity   Assist Walk 50  feet with 2 turns activity did not occur: Safety/medical concerns         Walk 150 feet activity   Assist Walk 150 feet activity did not occur: Safety/medical concerns         Walk 10 feet on uneven surface  activity   Assist Walk 10 feet on uneven surfaces activity did not occur: Safety/medical concerns         Wheelchair     Assist     Wheelchair activity did not occur: Safety/medical concerns (limited by cognitive deficits)         Wheelchair 50 feet with 2 turns activity    Assist    Wheelchair 50 feet with 2 turns activity did not occur: Safety/medical concerns       Wheelchair 150 feet activity     Assist  Wheelchair 150 feet activity did not occur: Safety/medical concerns       Blood pressure 120/83, pulse 78, temperature 98.1  F (36.7 C), temperature source Oral, resp. rate 16, height 5\' 10"  (1.778 m), weight 86.3 kg, SpO2 97 %.    Medical Problem List and Plan: 1. 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  Status post posterior decompression with craniectomy 11/26/2021             -patient may not yet shower             -ELOS/Goals: 18-22 days, supervision to min assist goals  -Continue CIR therapies including PT, OT, and SLP  2.  Antithrombotics: -DVT/anticoagulation:  Pharmaceutical: Other (comment) Eliquis             -antiplatelet therapy: N/A 3. Pain Management: Zanaflex 1 mg 3 times daily, Voltaren gel 4 times daily, Fioricet as needed, oxycodone as needed 4. Mood/sleep: Klonopin 0.5 mg nightly, melatonin 5 mg daily as needed             -antipsychotic agents: Zyprexa               -continue sleep chart  1/12 have reduced hs zyprexa to 2.5mg  5. Neuropsych: This patient is not capable of making decisions on his own behalf.  -still requires soft wrist restraints for safety/equipiment 6. Skin/Wound Care: Routine skin checks 7. Fluids/Electrolytes/Nutrition: Routine in and outs with follow-up chemistries             -continue current TF  -protein supplementation thru peg for low albumin 8.  Prolonged ventilatory support.  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   -Patient downsized to a #6 cuffless Shiley 12/24/2021--tolerating   -decreased to #4 cuffless today -sxn prn -PMV trials with SLP 9.  Dysphagia.  Status post gastrostomy tube 12/13/2021 per Dr.Lovick. -advance per speech therapy 10.  Aspiration pneumonia.  Antibiotic therapy completed.  -afebrile  -aspiration precautions 11.  New onset atrial fibrillation.  Started Eliquis 12/17/2021.  Follow-up carotid service.  Continue low-dose beta-blocker.  1/11 HR in 50's 12.  Hypertension.  Norvasc 10 mg daily, Cozaar 50 mg daily, , clonidine 0.1 mg every 8 hours.     -bp  well controlled 13.  Hyperlipidemia.  Lipitor 14.  History of BPH/urinary retention.  Urecholine 10 mg 3 times daily  -pt voiding incontinently. One bladder volume checked was 200  -check PVR's 15.  CAD with CABG times 03/2011 in [place].  Follow-up per cardiology services.     LOS: 2 days A FACE TO FACE EVALUATION WAS PERFORMED  Meredith Staggers  12/27/2021, 10:37 AM

## 2021-12-27 NOTE — Care Management (Signed)
Inpatient Rehabilitation Center Individual Statement of Services  Patient Name:  [name]  Date:  12/27/2021  Welcome to the [facility].  Our goal is to provide you with an individualized program based on your diagnosis and situation, designed to meet your specific needs.  With this comprehensive rehabilitation program, you will be expected to participate in at least 3 hours of rehabilitation therapies Monday-Friday, with modified therapy programming on the weekends.  Your rehabilitation program will include the following services:  Physical Therapy (PT), Occupational Therapy (OT), Speech Therapy (ST), 24 hour per day rehabilitation nursing, Therapeutic Recreaction (TR), Psychology, Neuropsychology, Care Coordinator, Rehabilitation Medicine, [place], and Other  Weekly team conferences will be held on Tuesdays to discuss your progress.  Your Inpatient Rehabilitation Care Coordinator will talk with you frequently to get your input and to update you on team discussions.  Team conferences with you and your family in attendance may also be held.  Expected length of stay: 3-4 weeks    Overall anticipated outcome: Supervision to Minimal Assistance  Depending on your progress and recovery, your program may change. Your Inpatient Rehabilitation Care Coordinator will coordinate services and will keep you informed of any changes. Your Inpatient Rehabilitation Care Coordinator's name and contact numbers are listed  below.  The following services may also be recommended but are not provided by the [facility] will be made to provide these services after discharge if needed.  Arrangements include referral to agencies that provide these services.  Your insurance has been verified to be:  Medicare  A/B  Your primary doctor is:  Rogelia Rohrer  Pertinent information will be shared with your doctor and your insurance company.  Inpatient Rehabilitation Care Coordinator:  Cathleen Corti [phone] or [phone]  Information discussed with and copy given to patient by: [name], 12/27/2021, 10:33 AM

## 2021-12-27 NOTE — Progress Notes (Signed)
Physical Therapy Session Note  Patient Details  Name: [name] MRN: [id number] Date of Birth: Jan 25, 1946  Today's Date: 12/27/2021 PT Individual Time: 1120-1205 PT Individual Time Calculation (min): 45 min   Short Term Goals: Week 1:  PT Short Term Goal 1 (Week 1): Patient will perform bed mobility with mod A in a flat bed without rails. PT Short Term Goal 2 (Week 1): Patient will perform basic transfers with mod A consistently. PT Short Term Goal 3 (Week 1): Patient will initiate gait training >10 feet.  Skilled Therapeutic Interventions/Progress Updates: Pt presented in TIS sleeping with son Rob present. Pt able to be aroused and agreeable to therapy. Pt denies pain during session. Session focused on sustained task and sitting balance. PTA applied PMV, detached feeding tube and switched to portable tank. Pt then dependently transferred to day room. Pt participated in reaching task naming color and throwing bean bags to bin. Pt was able to initiate trunk flexion, reach for bags with a moderate challenge outside BOS, and throw to bin with minimal cues. Pt able to complete max of 4 before becoming distracted and requiring redirection. Pt then participated in ball toss and able to complete for 3 min for sustained task. Pt then transferred to [employer] and participated in reciprocal movement at 60cm/sec x 2:30, noted to become increasingly fatigued and near falling asleep near end of activity. Pt transported back to room and performed stand pivot transfer with modA to EOB. Pt was able to transfer to sidelying on flat bed with modA for BLE management only. With increased time pt returned to supine with CGA and increased time. PTA removed PMV, re-attached feeding tube, and soft wrist restraints. Pt left in bed sleeping with bed alarm on and current needs met.      Therapy Documentation Precautions:  Precautions Precautions: Fall Precaution Comments: trach, peg, B wrist  restraints Restrictions Weight Bearing Restrictions: No General:   Vital Signs: Therapy Vitals Pulse Rate: 78 Resp: 16 BP: 120/83 Patient Position (if appropriate): Sitting Oxygen Therapy O2 Device: Tracheostomy Collar O2 Flow Rate (L/min): 5 L/min FiO2 (%): 28 % Pain: Pain Assessment Pain Scale: 0-10 Pain Score: 0-No pain   Therapy/Group: Individual Therapy  Courtni Balash Toluwani Ruder, PTA  12/27/2021, 12:34 PM

## 2021-12-27 NOTE — Progress Notes (Signed)
Occupational Therapy Session Note  Patient Details  Name: [name] MRN: [id number] Date of Birth: [birth year]   Session 1: Today's Date: 12/27/2021 OT Co-Treatment Time: 5625-6389 OT Co-Treatment Time Calculation (min): 30 min Total time in room: 730-840   Session 2: Today's Date: 12/27/2021 OT Individual Time: 3734-2876 OT Individual Time Calculation (min): 45 min   Short Term Goals: Week 1:  OT Short Term Goal 1 (Week 1): Pt will attend to ADL task for 1 min with min cueing OT Short Term Goal 2 (Week 1): Pt will don shirt with min A OT Short Term Goal 3 (Week 1): Pt will complete toilet transfer with LRAD with mod A OT Short Term Goal 4 (Week 1): Pt will complete bimanual grooming tasks with min A  Skilled Therapeutic Interventions/Progress Updates:    Session 1: Co-tx with SLP Courtney. OT goals addressed include self care retraining, cognitive retraining, ADL transfers, and visual perceptional skills. He was overall much more alert this session compared to yesterday. All VSS throughout session on 5L 28% FiO2 via trach collar. Peg flushed and disconnected d/t risk of pt pulling at line. Language more fluent but confused overall. Pt completed bed mobility to EOB with mod A. He required extra time and mod cueing for command following overall. Max A stand pivot transfer to the [facility]. Pt with continent urine void however unaware that urine was exiting BSC bucket over the top. Pt impulsively standing at times, requiring close supervision and cueing/assist. He demonstrated much improve head and trunk control in sitting. He donned new brief and pants with max +2 assist for safety. Pt completed SLP facilitated ice chip consumption, OT assisting with attention and cueing. Pt able to reach for comb, demonstrating appropriate use with min questioning cues, less UE ataxia as well. He donned a button up shirt with max A. He was taken to the dayroom via TIS w/c. He worked on functional reaching  forward with RUE shoulder flexion, requiring mod facilitation to maintain 70 degrees of flexion. He returned to his room and was left sitting up with all needs met, chair alarm set. Lap posey apron provided for restlessness with his hands. B wrist restraints on. Trach collar on 5L 28% FiO2.    Session 2:  Pt was received supine, resting with eyes closed. Pt more lethargic this session overall, requiring extra cueing and physical assist. Pt completed bed mobility to EOB with mod A. He required cueing for head elevation frequently. All VSS throughout session. He completed a stand pivot transfer to the [place] with max A. Total A for clothing management. No void but pt incontinent in brief. Slight improvement in standing with the RW. Pt completed transfer to the TIS w/c with max A. He was taken to the therapy gym where he worked on [place] coordination- throwing/catching a ball with 75% accuracy. Standing at a single parallel bar with mod A with extra cueing for upright posture and glute activation. Pt returned to his room via TIS and transferred back to bed. Pt was left supine with all needs met, respiratory staff present. B wrist restraints on. Trach collar on 5L 28% FiO2.    Therapy Documentation Precautions:  Precautions Precautions: Fall Precaution Comments: trach, peg, B wrist restraints Restrictions Weight Bearing Restrictions: No  Therapy/Group: Individual Therapy  [name] 12/27/2021, 6:27 AM

## 2021-12-27 NOTE — Progress Notes (Signed)
Patients O2 sensor was alarming. When this nurse went into the room, patient had taken off the finger probe. This nurse went to get a new sensor but the patient would not allow he to replace it & started asking for the restraints to be removed.Explained to the patient that the restraints were there to prevent him from pulling at his trach & other lines like the O2 sensor, he became angry & balled his fists up so that the O2 sensor could not be applied to his fingers. His nurse was made aware.No acute distress noted at the time.

## 2021-12-27 NOTE — Evaluation (Signed)
Speech Language Pathology Bedside Swallow Evaluation and Session Note   Patient Details  Name: [name] MRN: [id number] Date of Birth: 1946/09/08  SLP Diagnosis: Dysphagia  Rehab Potential: Excellent ELOS: 3-4 weeks    Today's Date: 12/27/2021 SLP Co-Treatment Time: 0730-0810 (Co-tx with OT (SD) [phone]) SLP Co-Treatment Time Calculation (min): 40 min   Hospital Problem: Principal Problem:   Cerebellar infarction [facility])  Past Medical History:  Past Medical History:  Diagnosis Date   CAD (coronary artery disease)    a. s/p CABG in 2012   Hyperlipidemia    Past Surgical History:  Past Surgical History:  Procedure Laterality Date   CORONARY ARTERY BYPASS GRAFT  2012   ESOPHAGOGASTRODUODENOSCOPY N/A 12/13/2021   Procedure: ESOPHAGOGASTRODUODENOSCOPY (EGD);  Surgeon: Jesusita Oka, MD;  Location: [facility] ENDOSCOPY;  Service: General;  Laterality: N/A;   PEG PLACEMENT N/A 12/13/2021   Procedure: PERCUTANEOUS ENDOSCOPIC GASTROSTOMY (PEG) PLACEMENT;  Surgeon: Jesusita Oka, MD;  Location: [facility] ENDOSCOPY;  Service: General;  Laterality: N/A;   SUBOCCIPITAL CRANIECTOMY CERVICAL LAMINECTOMY N/A 11/26/2021   Procedure: SUBOCCIPITAL CRANIECTOMY;  Surgeon: Judith Part, MD;  Location: [facility];  Service: Neurosurgery;  Laterality: N/A;    Assessment / Plan / Recommendation Clinical Impression SLP provided oral care via the suction toothbrush. Patient consumed trials of ice chips with efficient mastication noted. Patient with inconsistent awareness of bolus resulting in one large coughing episode. When patient was attentive to task, patient's overall swallow initiation appeared more timely. Recommend ongoing trials and hopeful for repeat MBS within the next week as mentation and overall cognitive function improves. Recommend patient remain NPO with trials from SLP only. Patient's family verbalized understanding.    Skilled Therapeutic Interventions          Administered a bedside  swallow evaluation, please see above for details. Skilled co-treatment with OT focused on cognitive and dysphagia goals. Upon arrival, patient was awake in bed and agreeable to participate in treatment session. PMSV donned and remained in place throughout the entirety of the session with all vitals remaining WFL. Patient demonstrated adequate phonation with language of confusion noted. Patient reported he needed to void and was transferred to the commode with +2 assist. Patient was continent of urine. Patient required extra time and Mod verbal and tactile cues to initiate functional tasks during dressing although impulsivity was noted, mostly during transfers. Restlessness was also noted as patient focusing on lines from continuous pulse ox. Patient much more talkative and engaged this session with intermittent neologisms noted. Patient left sitting up in tilt-in-space wheelchair with alarm on and son present. Continue with current plan of care.     SLP Assessment  Patient will need skilled [facility] during CIR admission    Recommendations  SLP Diet Recommendations: NPO Medication Administration: Via alternative means Oral Care Recommendations: Oral care QID Patient destination: Home Follow up Recommendations: Home Health SLP;Outpatient SLP;24 hour supervision/assistance Equipment Recommended: To be determined    SLP Frequency 3 to 5 out of 7 days   SLP Duration  SLP Intensity  SLP Treatment/Interventions 3-4 weeks  Minumum of 1-2 x/day, 30 to 90 minutes  Cognitive remediation/compensation;Internal/external aids;Speech/Language facilitation;Therapeutic Activities;Environmental controls;Cueing hierarchy;Functional tasks;Patient/family education    Pain No/Denies Pain   Care Tool Care Tool Cognition Ability to hear (with hearing aid or hearing appliances if normally used Ability to hear (with hearing aid or hearing appliances if normally used): 1. Minimal  difficulty - difficulty in some environments (e.g. when person speaks softly or  setting is noisy)   Expression of Ideas and Wants Expression of Ideas and Wants: 2. Frequent difficulty - frequently exhibits difficulty with expressing needs and ideas   Understanding Verbal and Non-Verbal Content Understanding Verbal and Non-Verbal Content: 2. Sometimes understands - understands only basic conversations or simple, direct phrases. Frequently requires cues to understand  Memory/Recall Ability Memory/Recall Ability : None of the above were recalled   Bedside Swallowing Assessment General Date of Onset: 11/25/21 Previous Swallow Assessment: MBS 11/30/21: Dys. 3 textures with nectar-thick liquids. Patient had medical decline and eventually made NPO with a PEG Diet Prior to this Study: NPO Temperature Spikes Noted: No Respiratory Status: Trach Trach Size and Type: #6;Uncuffed;With PMSV in place History of Recent Intubation: No Behavior/Cognition: Requires cueing;Cooperative;Pleasant mood Oral Cavity - Dentition: Adequate natural dentition Self-Feeding Abilities: Able to feed self;Needs assist Vision: Functional for self-feeding Patient Positioning: Upright in chair/Tumbleform Baseline Vocal Quality: Low vocal intensity Volitional Cough: Weak Volitional Swallow: Able to elicit  Ice Chips Ice chips: Impaired Presentation: Self Fed;Spoon Oral Phase Impairments: Poor awareness of bolus Pharyngeal Phase Impairments: Cough - Immediate Other Comments: 1 out of multiple trials Thin Liquid Thin Liquid: Not tested Nectar Thick Nectar Thick Liquid: Not tested Honey Thick Honey Thick Liquid: Not tested Puree Puree: Not tested Solid Solid: Not tested BSE Assessment Risk for Aspiration Impact on safety and function: Moderate aspiration risk Other Related Risk Factors: Lethargy;Cognitive impairment;Prolonged intubation;Deconditioning;Decreased respiratory status;Tracheostomy  Short Term  Goals: Week 1: SLP Short Term Goal 1 (Week 1): Patient will sustain attention to functional tasks for 5 minutes with Mod verbal cues for redirection. SLP Short Term Goal 2 (Week 1): Patient will initiate functional tasks in 50% of opportunities with Mod verbal cues. SLP Short Term Goal 3 (Week 1): Patient will verbalize wants/needs at the phrase level in 50% of opportunities with Mod verbal cues. SLP Short Term Goal 4 (Week 1): Patient will demonstrate orientaton to place, time and situation with Mod verbal and visual cues. SLP Short Term Goal 5 (Week 1): Patient will consume trials of ice chips with minimal overt s/s of aspiration and Min verbal cues for attention to bolus over 2 sessions to assess readiness for repeat MBS.  Refer to Care Plan for Long Term Goals  Recommendations for other services: None   Discharge Criteria: Patient will be discharged from SLP if patient refuses treatment 3 consecutive times without medical reason, if treatment goals not met, if there is a change in medical status, if patient makes no progress towards goals or if patient is discharged from hospital.  The above assessment, treatment plan, treatment alternatives and goals were discussed and mutually agreed upon: by patient and by family  ,  12/27/2021, 3:22 PM

## 2021-12-27 NOTE — Progress Notes (Signed)
Patient ID: [name], male   DOB: April 01, 1946, 76 y.o.   MRN: [id number]  SW made efforts to meet with pt but pt sleeping. SW will continue to make efforts to meet with patient to complete assessment.  1307-SW spoke with pt wife [name] to introduce self, explain role, and discuss discharge process. Wife confirms that she does work from home but has a flexible job. States she is unsure on how long their son will be here from [place], however, states there is support from various family/friends that can come by and sit with patient during the day if needed.  SW informed on statement of service in room for ELOS 3-4 weeks, and SW will follow-up after team conference with updates.   [name], MSW, [place] Office: [phone] Cell: [phone] Fax: [phone]

## 2021-12-27 NOTE — Progress Notes (Signed)
Physical Therapy Session Note  Patient Details  Name: [name] MRN: [id number] Date of Birth: 25-Mar-1946  Today's Date: 12/27/2021 PT Individual Time: 8127-5170 PT Individual Time Calculation (min): 60 min   Short Term Goals: Week 1:  PT Short Term Goal 1 (Week 1): Patient will perform bed mobility with mod A in a flat bed without rails. PT Short Term Goal 2 (Week 1): Patient will perform basic transfers with mod A consistently. PT Short Term Goal 3 (Week 1): Patient will initiate gait training >10 feet.  Skilled Therapeutic Interventions/Progress Updates:     Patient asleep in TIS w/c with his son, [name], present upon PT arrival. Patient slow to arouse and agreeable to PT session. Patient denied pain during session.  Tube feed paused and disconnected for mobility, PMSV donned throughout session. Vitals: BP: 120's-130's/80's  HR: 50's-60s SPO2: >92% on 5 L/min FiO2 28% via trach collar  Patient disoriented and confused at beginning of session. Perseverated on a wedding and continued to think his son was his brother. [name] reports that the patient did attend a wedding prior to admission. Provided orientation and discussed plan for gait trials during session with reduced language of confusion after.   Therapeutic Activity: Donned B tennis shoes with total A for energy/time management prior to mobility Transfers: Patient performed sit to/from stand x4 with min A +2 with a [place] walker x1 and min-mod A of 1 person with B hands on PT's shoulders x3. Provided multimodal cues for initiation and forward weight shift.  Patient ambulated 5 steps with the [place] walker with max A for AD management and very poor trunk and R lower extremity control. Returned patient to sitting for safety. Trailed ambulation with B hands on PT's shoulders x3 steps, again with poor trunk and R lower extremity control, again terminated trial for patient safety. [name] provided +2 w/c follow for both trials.   Patient  demonstrated significant fatigue following gait trials. Patient requested to stay in the chair and talk to his son. Returned patient to the room and tilted chair to 25 deg for improved resting posture prior to next therapy session. Patient promptly fell asleep.   Discussed d/c planning, rehab potential, and patient's goals for mobility with [name], as he will be one of the primary caregivers at d/c. [name] was Patent attorney of education.  Patient in [place] w/c with his son at bedside, B soft wrist restraints secured, PMSV removed, and tube feed running at end of session with breaks locked, seat belt alarm set, and all needs within reach.   Therapy Documentation Precautions:  Precautions Precautions: Fall Precaution Comments: trach, peg, B wrist restraints Restrictions Weight Bearing Restrictions: No    Therapy/Group: Individual Therapy  [name] PT, DPT  12/27/2021, 4:43 PM

## 2021-12-28 DIAGNOSIS — K921 Melena: Secondary | ICD-10-CM

## 2021-12-28 LAB — CBC
HCT: 41.5 % (ref 39.0–52.0)
Hemoglobin: 14.1 g/dL (ref 13.0–17.0)
MCH: 31.3 pg (ref 26.0–34.0)
MCHC: 34 g/dL (ref 30.0–36.0)
MCV: 92 fL (ref 80.0–100.0)
Platelets: 307 10*3/uL (ref 150–400)
RBC: 4.51 MIL/uL (ref 4.22–5.81)
RDW: 14.7 % (ref 11.5–15.5)
WBC: 5.9 10*3/uL (ref 4.0–10.5)
nRBC: 0 % (ref 0.0–0.2)

## 2021-12-28 LAB — GLUCOSE, CAPILLARY
Glucose-Capillary: 107 mg/dL — ABNORMAL HIGH (ref 70–99)
Glucose-Capillary: 109 mg/dL — ABNORMAL HIGH (ref 70–99)
Glucose-Capillary: 110 mg/dL — ABNORMAL HIGH (ref 70–99)
Glucose-Capillary: 118 mg/dL — ABNORMAL HIGH (ref 70–99)
Glucose-Capillary: 122 mg/dL — ABNORMAL HIGH (ref 70–99)
Glucose-Capillary: 123 mg/dL — ABNORMAL HIGH (ref 70–99)
Glucose-Capillary: 125 mg/dL — ABNORMAL HIGH (ref 70–99)

## 2021-12-28 MED ORDER — OSMOLITE 1.5 CAL PO LIQD
1000.0000 mL | ORAL | Status: DC
  Administered 2021-12-28 – 2022-01-03 (×7): 1000 mL
  Filled 2021-12-28 (×5): qty 1000

## 2021-12-28 MED ORDER — ONDANSETRON HCL 4 MG/2ML IJ SOLN
4.0000 mg | Freq: Four times a day (QID) | INTRAMUSCULAR | Status: DC | PRN
  Administered 2022-01-09 – 2022-01-25 (×2): 4 mg via INTRAMUSCULAR
  Filled 2021-12-28 (×3): qty 2

## 2021-12-28 MED ORDER — OLANZAPINE 5 MG PO TBDP
5.0000 mg | ORAL_TABLET | Freq: Every day | ORAL | Status: DC
  Administered 2021-12-28 – 2021-12-31 (×4): 5 mg via SUBLINGUAL
  Filled 2021-12-28 (×5): qty 1

## 2021-12-28 NOTE — IPOC Note (Signed)
Overall Plan of Care [facility]) Patient Details Name: [name] MRN: [id number] DOB: May 04, 1946  Admitting Diagnosis: Cerebellar infarction [facility])  Hospital Problems: Principal Problem:   Cerebellar infarction [facility])     Functional Problem List: Nursing Bladder, Bowel, Endurance, Medication Management, Nutrition, Perception, Safety, Sensory, Skin Integrity  PT Balance, Perception, Behavior, Safety, Sensory, Edema, Endurance, Skin Integrity, Motor, Nutrition, Pain  OT Balance, Perception, Safety, Cognition, Sensory, Edema, Skin Integrity, Endurance, Vision, Motor, Nutrition, Pain  SLP Nutrition  TR         Basic ADLs: OT Dressing, Toileting, Bathing, Grooming     Advanced  ADLs: OT       Transfers: PT Bed Mobility, Bed to Chair, Car, Manufacturing systems engineer, Metallurgist: PT Ambulation, Emergency planning/management officer, Stairs     Additional Impairments: OT None  SLP Swallowing expression Social Interaction, Problem Solving, Memory, Attention, Awareness  TR      Anticipated Outcomes Item Anticipated Outcome  Self Feeding no goal set  Swallowing  Supervision   Basic self-care  (S)  Toileting  min A   Bathroom Transfers min A  Bowel/Bladder  min assist  Transfers  CGA using LRAD  Locomotion  CGA using LRAD  Communication  Supervision  Cognition  Min A  Pain  n/a  Safety/Judgment  min assist and no falls   Therapy Plan: PT Intensity: Minimum of 1-2 x/day ,45 to 90 minutes PT Frequency: 5 out of 7 days PT Duration Estimated Length of Stay: 3-4 weeks OT Intensity: Minimum of 1-2 x/day, 45 to 90 minutes OT Frequency: 5 out of 7 days OT Duration/Estimated Length of Stay: 3-4 weeks SLP Intensity: Minumum of 1-2 x/day, 30 to 90 minutes SLP Frequency: 3 to 5 out of 7 days SLP Duration/Estimated Length of Stay: 3-4 weeks   Due to the current state of emergency, patients may not be receiving their 3-hours of Medicare-mandated therapy.   Team  Interventions: Nursing Interventions Patient/Family Education, Bladder Management, Bowel Management, Disease Management/Prevention, Medication Management, Skin Care/Wound Management, Cognitive Remediation/Compensation, Dysphagia/Aspiration Precaution Training, Discharge Planning, Psychosocial Support  PT interventions Ambulation/gait training, Cognitive remediation/compensation, Discharge planning, DME/adaptive equipment instruction, Functional mobility training, Pain management, Psychosocial support, Splinting/orthotics, Therapeutic Activities, UE/LE Strength taining/ROM, Visual/perceptual remediation/compensation, Wheelchair propulsion/positioning, UE/LE Coordination activities, Therapeutic Exercise, Stair training, Skin care/wound management, Patient/family education, Neuromuscular re-education, Functional electrical stimulation, Disease management/prevention, Academic librarian, Training and development officer  OT Interventions Training and development officer, Discharge planning, Pain management, Self Care/advanced ADL retraining, Therapeutic Activities, UE/LE Coordination activities, Visual/perceptual remediation/compensation, Therapeutic Exercise, Skin care/wound managment, Patient/family education, Functional mobility training, Disease mangement/prevention, Cognitive remediation/compensation, Academic librarian, Engineer, drilling, Neuromuscular re-education, Psychosocial support, UE/LE Strength taining/ROM, Wheelchair propulsion/positioning  SLP Interventions Cognitive remediation/compensation, Internal/external aids, Speech/Language facilitation, Therapeutic Activities, Environmental controls, Cueing hierarchy, Functional tasks, Patient/family education  TR Interventions    SW/CM Interventions Discharge Planning, Psychosocial Support, Patient/Family Education   Barriers to Discharge MD  Medical stability  Nursing Decreased caregiver support, Home environment access/layout, Trach,  Incontinence, Wound Care, Lack of/limited family support, Weight, Medication compliance, Nutrition means, New oxygen Lives in 1 level home with 2 steps to enter and right rail. Lives with spouse. Spouse, son, and family can provide 24/7 assist at discharge.  PT Inaccessible home environment, Decreased caregiver support, Incontinence, Behavior, Nutrition means, Home environment access/layout    OT      SLP      SW       Team Discharge Planning: Destination: PT-Home ,OT- Home , SLP-Home Projected Follow-up: PT-Outpatient PT,  OT-  Home health OT, SLP-Home Health SLP, Outpatient SLP, 24 hour supervision/assistance Projected Equipment Needs: PT-To be determined, OT- To be determined, SLP-To be determined Equipment Details: PT- , OT-  Patient/family involved in discharge planning: PT- Family member/caregiver,  OT-Patient unable/family or caregiver not available, SLP-Patient, Family member/caregiver  MD ELOS: 21-28 days Medical Rehab Prognosis:  Excellent Assessment: The patient has been admitted for CIR therapies with the diagnosis of cerebellar infarct s/p crani. The team will be addressing functional mobility, strength, stamina, balance, safety, adaptive techniques and equipment, self-care, bowel and bladder mgt, patient and caregiver education, NMR, speech, cognition, swallowing, trach mgt, community reentry. Goals have been set at min assist with self-care and cognition and contact guard assist with mobility.   Due to the current state of emergency, patients may not be receiving their 3 hours per day of Medicare-mandated therapy.    Meredith Staggers, MD, FAAPMR     See Team Conference Notes for weekly updates to the plan of care

## 2021-12-28 NOTE — Progress Notes (Signed)
Inpatient Rehabilitation Care Coordinator Assessment and Plan Patient Details  Name: Challen Spainhour MRN: [id number] Date of Birth: 1946/08/18  Today's Date: 12/28/2021  Hospital Problems: Principal Problem:   Cerebellar infarction [facility])  Past Medical History:  Past Medical History:  Diagnosis Date   CAD (coronary artery disease)    a. s/p CABG in 2012   Hyperlipidemia    Past Surgical History:  Past Surgical History:  Procedure Laterality Date   CORONARY ARTERY BYPASS GRAFT  2012   ESOPHAGOGASTRODUODENOSCOPY N/A 12/13/2021   Procedure: ESOPHAGOGASTRODUODENOSCOPY (EGD);  Surgeon: Jesusita Oka, MD;  Location: [facility] ENDOSCOPY;  Service: General;  Laterality: N/A;   PEG PLACEMENT N/A 12/13/2021   Procedure: PERCUTANEOUS ENDOSCOPIC GASTROSTOMY (PEG) PLACEMENT;  Surgeon: Jesusita Oka, MD;  Location: [facility] ENDOSCOPY;  Service: General;  Laterality: N/A;   SUBOCCIPITAL CRANIECTOMY CERVICAL LAMINECTOMY N/A 11/26/2021   Procedure: SUBOCCIPITAL CRANIECTOMY;  Surgeon: Judith Part, MD;  Location: [facility];  Service: Neurosurgery;  Laterality: N/A;   Social History:  reports that he has quit smoking. His smoking use included cigarettes. He has never used smokeless tobacco. He reports current alcohol use of about 3.0 standard drinks per week. He reports that he does not use drugs.  Family / Support Systems Marital Status: Married Patient Roles: Spouse Other Supports: None reported. Anticipated Caregiver: wife Ability/Limitations of Caregiver: Pt will d/c to home with his wife who will be primary caregiver. Unsure on how long their son visiting will be staying. Caregiver Availability: 24/7  Social History Preferred language: English Religion:  Health Literacy - How often do you need to have someone help you when you read instructions, pamphlets, or other written material from your doctor or pharmacy?: Patient unable to respond Writes: Yes Employment Status:  Retired Insurance account manager: N/A   Abuse/Neglect Abuse/Neglect Assessment Can Be Completed: Unable to assess, patient is non-responsive or altered mental status  Patient response to: Social Isolation - How often do you feel lonely or isolated from those around you?: Patient unable to respond  Emotional Status Pt's affect, behavior and adjustment status: Pt not feeling well at time of visit, due to emesis. Recent Psychosocial Issues: Unable to assess Psychiatric History: Unable to assess Substance Abuse History: Unablet to assess  Patient / Family Perceptions, Expectations & Goals Pt/Family understanding of illness & functional limitations: Pt wife has a general understanding of pt care needs Premorbid pt/family roles/activities: Independent PTA Anticipated changes in roles/activities/participation: Assistance with ADLs/IADLs  Education officer, environmental Agencies: None Premorbid Home Care/DME Agencies: None Transportation available at discharge: Wife Is the patient able to respond to transportation needs?: Yes In the past 12 months, has lack of transportation kept you from medical appointments or from getting medications?: No In the past 12 months, has lack of transportation kept you from meetings, work, or from getting things needed for daily living?: No Resource referrals recommended: Neuropsychology  Discharge Planning Living Arrangements: Spouse/significant other Support Systems: Spouse/significant other, Children Type of Residence: Private residence Insurance Resources: [employer], Multimedia programmer (specify) Nurse, mental health) Financial Resources: Radio broadcast assistant Screen Referred: No Living Expenses: Own Money Management: Patient, Spouse Does the patient have any problems obtaining your medications?: No Patient/Family Preliminary Plans: TBD Care Coordinator Barriers to Discharge: Decreased caregiver support, Lack of/limited family support Care Coordinator Anticipated  Follow Up Needs: HH/OP Expected length of stay: 3-4 weeks  Clinical Impression Assessment completed based on chart review and collateral reports. In depth assessment to be completed with family.   Alaiya Martindelcampo A Masaji Billups 12/28/2021, 2:16 PM

## 2021-12-28 NOTE — Progress Notes (Signed)
Speech Language Pathology Daily Session Note  Patient Details  Name: Monta Police MRN: [id number] Date of Birth: [birth year]  Today's Date: 12/28/2021 SLP Individual Time: 0730-0810 SLP Individual Time Calculation (min): 40 min  Short Term Goals: Week 1: SLP Short Term Goal 1 (Week 1): Patient will sustain attention to functional tasks for 5 minutes with Mod verbal cues for redirection. SLP Short Term Goal 2 (Week 1): Patient will initiate functional tasks in 50% of opportunities with Mod verbal cues. SLP Short Term Goal 3 (Week 1): Patient will verbalize wants/needs at the phrase level in 50% of opportunities with Mod verbal cues. SLP Short Term Goal 4 (Week 1): Patient will demonstrate orientaton to place, time and situation with Mod verbal and visual cues. SLP Short Term Goal 5 (Week 1): Patient will consume trials of ice chips with minimal overt s/s of aspiration and Min verbal cues for attention to bolus over 2 sessions to assess readiness for repeat MBS.  Skilled Therapeutic Interventions: Skilled treatment session focused on cognitive and speech goals. Upon arrival, patient was awake in bed. Patient pulled out his trach last night and it was replaced with a #4 cuffless with moderate amount of secretions noted around trach hub. SLP donned PMSV with all vitals remaining WFL throughout session.  Patient requesting to use the commode. SLP removed restraints to begin transfer, patient became restless and verbally frustrated and began pull at lines and around trach collar. Restraints were temporally reapplied to patient's wrists for safety. Patient with language of confusion and reporting he should be golfing and confused as to where he was since he attended 2 weddings yesterday. SLP provided passive orientation and reasoning for restraints. Patient eventually calm and cooperative and restraints were removed during peri care. Patient incontinent of bowel and bladder with clots noted in stool. RN  aware. A new gown was also donned. Patient allowed SLP to apply restraints at end of session and requested the "buttons." Patient handed the call bell in which he used appropriately to turn on the television. Patient left upright in bed with alarm on and restraints in place. Continue with current plan of care.      Pain Pain Assessment Pain Scale: 0-10 Pain Score: 0-No pain  Therapy/Group: Individual Therapy  Keneisha Heckart 12/28/2021, 11:50 AM

## 2021-12-28 NOTE — Progress Notes (Addendum)
Occupational Therapy Session Note  Patient Details  Name: [name] MRN: [id number] Date of Birth: October 23, 1946  Today's Date: 12/28/2021 OT Individual Time: 1130-1200 OT Individual Time Calculation (min): 30 min    Short Term Goals: Week 1:  OT Short Term Goal 1 (Week 1): Pt will attend to ADL task for 1 min with min cueing OT Short Term Goal 2 (Week 1): Pt will don shirt with min A OT Short Term Goal 3 (Week 1): Pt will complete toilet transfer with LRAD with mod A OT Short Term Goal 4 (Week 1): Pt will complete bimanual grooming tasks with min A  Skilled Therapeutic Interventions/Progress Updates:    Pt greeted sitting in TIS wc and agreeable to OT and PT COTX session. Pt on 5L of O2 thorughout session via [place]. Pt brought to therapy gym and completed 3 bouts of ambulation using 3 muskateers technique. See PT note for further details regarding ambulation. Pt reported he needed to go to the bathroom, but then began vomiting on the way back to the room. Nursing notified. UB bathing.dressing completed at the sink with min A and verbal cues for thoroughness. Sit<>stands at the sink with mod A +2 for max A peri-care and brief change. Pt was able to assist with washing front peri-area with mod A +2 for standing balance. Max A for donning clean pants. Pt left seated in TIS wc with lab in room for blood draw. B Wrist restraints on and tilted in TIS.   Therapy Documentation Precautions:  Precautions Precautions: Fall Precaution Comments: trach, peg, B wrist restraints Restrictions Weight Bearing Restrictions: No Pain:  Denies pain   Therapy/Group: Co-Treatment  Daneen Schick Sava Proby 12/28/2021, 12:29 PM

## 2021-12-28 NOTE — Progress Notes (Signed)
Patient ID: [name], male   DOB: [birth year], 76 y.o.   MRN: [id number]  SW made efforts to complete assessment, but pt unable to complete due to cognition.   [name], MSW, [place] Office: [phone] Cell: [phone] Fax: [phone]

## 2021-12-28 NOTE — Progress Notes (Addendum)
Occupational Therapy Note  Patient Details  Name: [name] MRN: [id number] Date of Birth: 1946-05-30  Today's Date: 12/28/2021 OT Missed Time: 58 Minutes Missed Time Reason: Patient fatigue   Per discussion with nursing, pt had another episode of emesis and not feeling well. Pt asleep in bed upon OT arrival, able to wake briefly, but pt declined participating in therapy stating he did not feel well. OT to follow up per plan of care.   Daneen Schick Ciela Mahajan 12/28/2021, 1:24 PM

## 2021-12-28 NOTE — Progress Notes (Signed)
Trach capped per order from Meredith Staggers, MD. No noted respiratory distress.

## 2021-12-28 NOTE — Progress Notes (Signed)
RN has been notified by PT and OT that patient had 2 occurrences of vomiting during sessions. Patient has continuous tube feedings infusing through PEG tube. RN assessed patient, denies nausea. RN notified [name], [place] and advised this RN to contact dietitian regarding emesis episodes. Order for IM Zofran 4mg  placed PRN.  RN made dietitian, Colletta Maryland, aware. New orders given for Osmolite 1.5 starting at 20 mls/hr and to increase 10 mls every 4 hours with a goal of 26mls/hr for 20 hours. RN will implement and continue to monitor patient.   Ermalinda Memos, RN

## 2021-12-28 NOTE — Progress Notes (Signed)
Physical Therapy Session Note  Patient Details  Name: [name] MRN: [id number] Date of Birth: 21-Jun-1946  Today's Date: 12/28/2021 PT Co-Treatment Time: 1100-1130 PT Co-Treatment Time Calculation (min): 30 min  Short Term Goals: Week 1:  PT Short Term Goal 1 (Week 1): Patient will perform bed mobility with mod A in a flat bed without rails. PT Short Term Goal 2 (Week 1): Patient will perform basic transfers with mod A consistently. PT Short Term Goal 3 (Week 1): Patient will initiate gait training >10 feet.  Skilled Therapeutic Interventions/Progress Updates:     Skilled cotreat session with OT. Pt received seated in tilt in space WC with wrist restraints. No complaint of pain. RN oks disconnection of feeding tube for session. WC transport to gym for time management. Pt performs multiple reps of sit to stand during session with modA +2. OT provides HHA on the R and PT uses 3 Musketeers technique on L. Verbal and tactile cues for anterior weight shift, hand placement, and sequencing. Pt ambulates multiple bouts using this technique, x25', x40', and x60'. PT provides cues for upright posture to improve balance, as well as increasing stride length. Pt has ataxic gait and tendency to cross over with L leg. Also appears to require increased assistance supporting body weight when in stance phase on R. Pt turns 180 degrees during each bout, requiring increased cueing for sequencing of turns. Extended seated rest break between bouts. Pt verbalizes need to have bowel movement so WC transport back to room. Remainder of session with emphasis on ADLs and changing clothes and washing due to pt vomiting on himself. RN aware. Left seated in tilt in space with wrist restraints in place.  Therapy Documentation Precautions:  Precautions Precautions: Fall Precaution Comments: trach, peg, B wrist restraints Restrictions Weight Bearing Restrictions: No    Therapy/Group: Individual Therapy  Breck Coons, PT, DPT 12/28/2021, 12:53 PM

## 2021-12-28 NOTE — Progress Notes (Signed)
PROGRESS NOTE   Subjective/Complaints: Pt in bed. Restless, wants restraints off. "I need to use the bathroom". Nurse found blood clots in stool this morning. Pt also pulled out trach which was replaced.   ROS: Limited due to cognitive/behavioral    Objective:   No results found. Recent Labs    12/26/21 0538  WBC 4.4  HGB 12.3*  HCT 37.2*  PLT 247   Recent Labs    12/26/21 0538  NA 135  K 4.1  CL 100  CO2 27  GLUCOSE 119*  BUN 17  CREATININE 0.68  CALCIUM 8.4*    Intake/Output Summary (Last 24 hours) at 12/28/2021 1055 Last data filed at 12/28/2021 0810 Gross per 24 hour  Intake 750 ml  Output --  Net 750 ml         Physical Exam: Vital Signs Blood pressure 120/70, pulse (!) 59, temperature 99.1 F (37.3 C), temperature source Oral, resp. rate 15, height 5\' 10"  (1.778 m), weight 86.9 kg, SpO2 99 %.  Constitutional: No distress . Vital signs reviewed. Soft wrist restraints in place HEENT: NCAT, EOMI, oral membranes moist Neck: supple Cardiovascular: RRR without murmur. No JVD    Respiratory/Chest: CTA Bilaterally without wheezes or rales. Normal effort    GI/Abdomen: BS +, non-tender, non-distended Ext: no clubbing, cyanosis, or edema Psych: alert, restless,non-agitated, is redirectable  Skin: occipital incision remains dry with scab Neuro:  pt is awake, alert. Told me he was "in the hospital". Distracted but could attend with verbal cueing. .Moves all 4's at least antigravity. No resting tone. Sensory exam grossly intact Musculoskeletal: cerivcal tenderness.     Assessment/Plan: 1. Functional deficits which require 3+ hours per day of interdisciplinary therapy in a comprehensive inpatient rehab setting. Physiatrist is providing close team supervision and 24 hour management of active medical problems listed below. Physiatrist and rehab team continue to assess barriers to discharge/monitor patient  progress toward functional and medical goals  Care Tool:  Bathing    Body parts bathed by patient: Right arm, Left arm, Chest, Abdomen, Face   Body parts bathed by helper: Front perineal area, Buttocks, Right upper leg, Left upper leg, Right lower leg, Left lower leg     Bathing assist Assist Level: Maximal Assistance - Patient 24 - 49%     Upper Body Dressing/Undressing Upper body dressing   What is the patient wearing?: Button up shirt    Upper body assist Assist Level: Maximal Assistance - Patient 25 - 49%    Lower Body Dressing/Undressing Lower body dressing      What is the patient wearing?: Pants, Incontinence brief     Lower body assist Assist for lower body dressing: 2 Helpers     Toileting Toileting    Toileting assist Assist for toileting: 2 Helpers     Transfers Chair/bed transfer  Transfers assist     Chair/bed transfer assist level: Maximal Assistance - Patient 25 - 49%     Locomotion Ambulation   Ambulation assist   Ambulation activity did not occur: Safety/medical concerns          Walk 10 feet activity   Assist  Walk 10 feet activity did not  occur: Safety/medical concerns        Walk 50 feet activity   Assist Walk 50 feet with 2 turns activity did not occur: Safety/medical concerns         Walk 150 feet activity   Assist Walk 150 feet activity did not occur: Safety/medical concerns         Walk 10 feet on uneven surface  activity   Assist Walk 10 feet on uneven surfaces activity did not occur: Safety/medical concerns         Wheelchair     Assist Is the patient using a wheelchair?: Yes Type of Wheelchair: Manual Wheelchair activity did not occur: Safety/medical concerns (limited by cognitive deficits)         Wheelchair 50 feet with 2 turns activity    Assist    Wheelchair 50 feet with 2 turns activity did not occur: Safety/medical concerns       Wheelchair 150 feet activity      Assist  Wheelchair 150 feet activity did not occur: Safety/medical concerns       Blood pressure 120/70, pulse (!) 59, temperature 99.1 F (37.3 C), temperature source Oral, resp. rate 15, height 5\' 10"  (1.778 m), weight 86.9 kg, SpO2 99 %.    Medical Problem List and Plan: 1. 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  Status post posterior decompression with craniectomy 11/26/2021             -patient may not yet shower             -ELOS/Goals: 18-22 days, supervision to min assist goals  -Continue CIR therapies including PT, OT, and SLP   2.  Antithrombotics: -DVT/anticoagulation:  Pharmaceutical: Other (comment) Eliquis             -antiplatelet therapy: N/A 3. Pain Management: Zanaflex 1 mg 3 times daily, Voltaren gel 4 times daily, Fioricet as needed, oxycodone as needed 4. Mood/sleep: Klonopin 0.5 mg nightly, melatonin 5 mg daily as needed             -antipsychotic agents: Zyprexa               -continue sleep chart  1/13 reduced hs zyprexa to 2.5mg  yesterday--probably why he's a little more restless this morning as well as last night   -resume 5mg  at night tonight, change to 2100 5. Neuropsych: This patient is not capable of making decisions on his own behalf.  -still requires soft wrist restraints for safety/equipiment 6. Skin/Wound Care: Routine skin checks 7. Fluids/Electrolytes/Nutrition: Routine in and outs with follow-up chemistries             -continue current TF  -protein supplementation thru peg for low albumin  -check labs Monday  8.  Prolonged ventilatory support.  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   -Patient downsized to a #6 cuffless Shiley 12/24/2021--tolerating   -decreased to #4 cuffless today -sxn prn -will cap trach during the day. If does well can be capped continuously starting 1/15 9.  Dysphagia.  Status post gastrostomy tube 12/13/2021 per  Dr.Lovick. -advance per speech therapy 10.  Aspiration pneumonia.  Antibiotic therapy completed.  -afebrile  -aspiration precautions 11.  New onset atrial fibrillation.  Started Eliquis 12/17/2021.  Follow-up carotid service.  Continue low-dose beta-blocker.  1/13 HR in 50's 12.  Hypertension.  Norvasc 10 mg daily, Cozaar 50 mg daily, , clonidine 0.1 mg every 8 hours.     -bp  well controlled 13.  Hyperlipidemia.  Lipitor 14.  History of BPH/urinary retention.  Urecholine 10 mg 3 times daily  -pt voiding incontinently. One bladder volume checked was 200  1/13 -dc urecholine and observe 15.  CAD with CABG times 03/2011 in [place].  Follow-up per cardiology services. 16. Blood clots in stool:  1/13-blood counts relatively stable over last 4 days       -was 12.3 on 1/11--->will recheck cbc today      -pt is on eliquis     -no GI history other than PEG placement     LOS: 3 days A FACE TO FACE EVALUATION WAS PERFORMED  Meredith Staggers 12/28/2021, 10:55 AM

## 2021-12-28 NOTE — Progress Notes (Signed)
Physical Therapy Session Note  Patient Details  Name: [name] MRN: [id number] Date of Birth: [birth year]  Today's Date: 12/28/2021 PT Individual Time: 0905-1000 PT Individual Time Calculation (min): 55 min   Short Term Goals: Week 1:  PT Short Term Goal 1 (Week 1): Patient will perform bed mobility with mod A in a flat bed without rails. PT Short Term Goal 2 (Week 1): Patient will perform basic transfers with mod A consistently. PT Short Term Goal 3 (Week 1): Patient will initiate gait training >10 feet.  Skilled Therapeutic Interventions/Progress Updates: Pt presented in bed agreeable to therapy. Pt denies pain during session. Pt noted to have pulled feeding tube from PEG line. Pt stating he's at the MD office and wife [name] is "up front" PTA re-orientated pt to place and situation. Pt was able to state that his brief was wet and wanted to get OOB. Pt performed supine to sit with modA and use of bed features once released from soft wrist restraints. PTA doffed gown and was able to don shirt with minA for sequencing as pt initially placed RUE through head opening vs sleeve. Pt then performed Sit to stand in [place] with CGA to allow for peri-care done at total A. +2 required for safety as pt impulsive and was reaching for bed rail, pt also noted to be incontinent of BM. Once completed pt transferred to [facility] via [facility] where PTA threaded pants, donned socks and shoes. Pt performed an additional stand in [place] to allow PTA to pull pants over hips. Once completed pt transferred to rehab gym and participated in Sit to stand in parallel bars as well as forward backwards walking ~60ft x 4 with minA for gait but MAX multimodal cues. Pt then stating "I need to poop" therefore quickly transferred back to room. NT present to assist therefore performed stand pivot to [facility] modA due to poor sequencing with NT assisting with clothing management. Pt was able to void but no additional BM. Pt noted to have small smear in  brief thus pt performed Sit to stand from [facility] with minA and pt able to maintain standing on RW while NT performed Peri-care. After brief seated rest at [facility] pt performed stand pivot transfer with RW and modA to [place]. Once completed pt left in TIS with feeding tube attached, soft wrist restraints re-applied, and  tray placed in front of pt with activity cloth within reach.      Therapy Documentation Precautions:  Precautions Precautions: Fall Precaution Comments: trach, peg, B wrist restraints Restrictions Weight Bearing Restrictions: No General:   Vital Signs: Therapy Vitals BP: 126/76 Oxygen Therapy SpO2: 96 % O2 Device: Room Air Pain:   Mobility:   Locomotion :    Trunk/Postural Assessment :    Balance:   Exercises:   Other Treatments:      Therapy/Group: Individual Therapy  Edras Wilford 12/28/2021, 4:01 PM

## 2021-12-28 NOTE — Progress Notes (Signed)
This nurse was notified that pt pulled out trach while in restraints. When entering room, Right arm was slightly swollen and restraint was lose and located on patients mid forearm. NT informed this nurse that he has seen pt slide down in bed to try and move location of restraint as well as try and bring head closer to restrained arms to get to trach. RT replaced trach, new #4 Shiley trach ordered and currently awaiting arrival to unit. Pt denies any pain, O2 normal limit. Pt denies any complaints.

## 2021-12-28 NOTE — Progress Notes (Signed)
Nutrition Follow-up ° °DOCUMENTATION CODES:  ° °Not applicable ° °INTERVENTION:  °At 1900, Restart tube feeds via PEG using new formula of Osmolite 1.5 cal at starting rate of 20 ml/hr and increase by 10 ml every 4 hours to goal rate of 65 ml/hr x 20 hours (may hold TF for up to 4 hours for therapy). ° °Provide 90 ml Prosource TF BID per tube.  °  °Provide free water flushes of 250 ml q 6 hours per tube.  °  °Tube feeding regimen at goal rate to provide 2110 kcal, 126 grams protein, 1988 ml free water.  ° °NUTRITION DIAGNOSIS:  ° °Inadequate oral intake related to inability to eat as evidenced by NPO status; ongoing ° °GOAL:  ° °Patient will meet greater than or equal to 90% of their needs; to be met with TF ° °MONITOR:  ° °Labs, Weight trends, TF tolerance, Skin, I & O's ° °REASON FOR ASSESSMENT:  ° °Consult °Enteral/tube feeding initiation and management ° °ASSESSMENT:  ° °76-year-old male with history of CAD status post CABG x4 2012, hyperlipidemia, nephrolithiasis, BPH/prostate cancer. Presented 11/25/2021 with altered mental status as well as dizziness with ataxic gait and diaphoretic. 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 Pt underwent suboccipital decompressive craniectomy 11/26/2021. Pt intubated for prolonged time underwent tracheostomy 12/10/2021 and PEG tube placement 12/13/2021. Pt with decreased functional mobility and admitted to CIR. ° °Trach collar capped. Per MD, plans to cap trach during the day and if pt tolerates, trach may be capped continuously starting 1/15. Pt continues on NPO status. Pt with multiple bouts of emesis today. Tube feeds currently turned off. RD to switch formula to Osmolite 1.5 cal to aid in better GI tolerance. Will plans to restart tube feeds at low volume in the evening to monitor and ensure tolerance.  ° °Labs and medications reviewed.  ° °Diet Order:   °Diet Order   ° °       °  Diet NPO time specified  Diet effective midnight       °  ° °  °  ° °   ° ° °EDUCATION NEEDS:  ° °Not appropriate for education at this time ° °Skin:  Skin Assessment: Reviewed RN Assessment °Skin Integrity Issues:: Incisions °Incisions: head ° °Last BM:  1/13 ° °Height:  ° °Ht Readings from Last 1 Encounters:  °12/25/21 5' 10" (1.778 m)  ° ° °Weight:  ° °Wt Readings from Last 1 Encounters:  °12/28/21 86.9 kg  ° °BMI:  Body mass index is 27.49 kg/m². ° °Estimated Nutritional Needs:  ° °Kcal:  2100-2300 ° °Protein:  115-125 grams ° °Fluid:  >/= 2 L/day ° ° , MS, RD, LDN °RD pager number/after hours weekend pager number on Amion. ° °

## 2021-12-28 NOTE — Progress Notes (Signed)
Blood found in pt stool during personal care. Further examination of stool showed multiple small blood clots. Hemoglobin 1/10 - 13.4 and 1/11 12.3 indicating downward trend. Charge nurse notified, MD notified. No complaints from patient at this time. All needs met, call light in reach.

## 2021-12-28 NOTE — Progress Notes (Addendum)
RT called to room because patient had pulled trach out while in restraints. RT used obturator with lube to insert new trach with no complications.  Positive color change with ETCO2 detector and bilateral breath sounds auscultated.  Patient vitals are WNL and no distress noted at this time.

## 2021-12-29 LAB — GLUCOSE, CAPILLARY
Glucose-Capillary: 111 mg/dL — ABNORMAL HIGH (ref 70–99)
Glucose-Capillary: 127 mg/dL — ABNORMAL HIGH (ref 70–99)
Glucose-Capillary: 130 mg/dL — ABNORMAL HIGH (ref 70–99)
Glucose-Capillary: 134 mg/dL — ABNORMAL HIGH (ref 70–99)
Glucose-Capillary: 142 mg/dL — ABNORMAL HIGH (ref 70–99)
Glucose-Capillary: 149 mg/dL — ABNORMAL HIGH (ref 70–99)

## 2021-12-29 NOTE — Progress Notes (Signed)
Had paged on call provider Dr. Letta Pate earlier regarding patient being too sleepy and if it was ok to hold nightly Klonopin and Zyprexa? Per provider ok to hold since patient is drowsy but to give sch meds if patient more alert or use pNR if agitated.   This care nurse went to give sch Zanaflex that was held earlier, at this time patient was fully alert and wake trying to pull on his PEG tube by leaning down towards his hands. Pt educated on not to pull on lines and all scheduled meds including Zyprexa, Zanaflex, and Klonopin given as ordered. Pt was agitated pulling on covers and sheets that he can reach for following hour.   At 2348 patient resting in the bed calmly in NAD.

## 2021-12-29 NOTE — Progress Notes (Signed)
Speech Language Pathology Daily Session Note  Patient Details  Name: [name] MRN: [id number] Date of Birth: [birth year]  Today's Date: 12/29/2021 SLP Individual Time: 0900-0940 SLP Individual Time Calculation (min): 40 min  Short Term Goals: Week 1: SLP Short Term Goal 1 (Week 1): Patient will sustain attention to functional tasks for 5 minutes with Mod verbal cues for redirection. SLP Short Term Goal 2 (Week 1): Patient will initiate functional tasks in 50% of opportunities with Mod verbal cues. SLP Short Term Goal 3 (Week 1): Patient will verbalize wants/needs at the phrase level in 50% of opportunities with Mod verbal cues. SLP Short Term Goal 4 (Week 1): Patient will demonstrate orientaton to place, time and situation with Mod verbal and visual cues. SLP Short Term Goal 5 (Week 1): Patient will consume trials of ice chips with minimal overt s/s of aspiration and Min verbal cues for attention to bolus over 2 sessions to assess readiness for repeat MBS.  Skilled Therapeutic Interventions:   Patient seen for skilled ST session with wife present with focus on cognitive-linguistic goals. Patient with eyes closed but alerted to voice. As session progressed, patient did become more alert and he was able to respond to biographical questions from SLP and wife. He was aware that he was in a hospital but not oriented to time, city, state, etc. When asked why he was here he replied, "I had a CT" and then he perseverated on that. 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 Trach cap was in place and patient's voice is clear and strong. As he continues to be lethargic, it is difficult to determine if difficulties in maintaining topic, perseverating on words, etc is related to CVA or not. Wife did also report that patient seemed to have had a  hallucination the other day, telling her "who is that behind you?" When there was no one else in room. As patient's alertness improves, expect to be ready for a repeat swallow test (MBS) with last one having been completed 4 weeks ago. Further cognitive-linguistic testing can be completed as patient's alertness improves. He was left in be with restraints in place and wife present in room. He continues to benefit from skilled SLP intervention to maximize cognitive-linguistic and swallow function prior to discharge.  Pain Pain Assessment Pain Scale: Faces Pain Score: 0-No pain Faces Pain Scale: No hurt  Therapy/Group: Individual Therapy  Sonia Baller, MA, CCC-SLP Speech Therapy

## 2021-12-29 NOTE — Progress Notes (Signed)
Trach capped per order.  RN at bedside and will notify RT if there are any problems.  Patient sats 96% on room air, no increased WOB at this time.

## 2021-12-29 NOTE — Progress Notes (Signed)
Occupational Therapy Session Note  Patient Details  Name: [name] MRN: [id number] Date of Birth: 1946-04-28  Today's Date: 12/29/2021 OT Individual Time: 0300-9233 OT Individual Time Calculation (min): 85 min    Short Term Goals: Week 1:  OT Short Term Goal 1 (Week 1): Pt will attend to ADL task for 1 min with min cueing OT Short Term Goal 2 (Week 1): Pt will don shirt with min A OT Short Term Goal 3 (Week 1): Pt will complete toilet transfer with LRAD with mod A OT Short Term Goal 4 (Week 1): Pt will complete bimanual grooming tasks with min A  Skilled Therapeutic Interventions/Progress Updates:    Pt received supine with his wife [name] present. Trach capped, all VSS. Pt reported need to urinate and impulsively initiated transfer to EOB. Min A to come EOB with use of bed rails. Pt completed a stand pivot transfer to the bariatric BSC (for safety) with mod A, +2 present for safety. He demonstrated reduced awareness of need to have BM, standing as he was beginning to go. He sat back down with cueing and successfully voided BM and urine. He completed ADLs at the sink with much improved following of commands, coordination, and motor planning. He was able to initiate simple ADLs with only min cueing. Wife [name] reminding OT pt is slightly HOH. Pt completed UB bathing with no cueing required, appropriate thoroughness with set up assist. Pull over shirt donned with min A. Pt with difficulty coordinating threading BLE into pants, requiring cueing for both awareness of deficits and max A overall to don. He stood at the sink several times with only mod A. He then completed oral care with min A for thoroughness on tongue hygiene (white film present). In the therapy gym pt completed 10 ft of functional mobility in the parallel bars with min A. Trialed use of a RW and pt was able to complete 25 ft with mod A, requiring more like max A toward end of the distance with very unsteady termination to a chair,  requiring +2 assist. Pt happy with progress! He returned to his room and demonstrated mobility to his wife with similar distance and assist. Upon return back to bed pt began vomiting. HR elevated, SpO2 WNL. RN alerted and respiratory therapist also happened to be in room. Once pt was stable and not vomiting he was left supine with his wife present. Instructed wife that with her direct close supervision it was ok to undo one wrist restraint. She knows how to reapply the restraint when she leaves room or ends supervision. Bed alarm set.   Therapy Documentation Precautions:  Precautions Precautions: Fall Precaution Comments: trach, peg, B wrist restraints Restrictions Weight Bearing Restrictions: No  Therapy/Group: Individual Therapy  [name] 12/29/2021, 7:24 AM

## 2021-12-29 NOTE — Progress Notes (Signed)
PROGRESS NOTE   Subjective/Complaints:   Pt awakens to voice, calm, but disoriented Able to pick "hospital" from a list of 3 locations  ROS: Limited due to cognitive/behavioral    Objective:   No results found. Recent Labs    12/28/21 1205  WBC 5.9  HGB 14.1  HCT 41.5  PLT 307    No results for input(s): NA, K, CL, CO2, GLUCOSE, BUN, CREATININE, CALCIUM in the last 72 hours.   Intake/Output Summary (Last 24 hours) at 12/29/2021 1008 Last data filed at 12/29/2021 0537 Gross per 24 hour  Intake 584 ml  Output --  Net 584 ml          Physical Exam: Vital Signs Blood pressure 131/73, pulse 61, temperature 98.1 F (36.7 C), temperature source Oral, resp. rate 16, height 5\' 10"  (1.778 m), weight 84.8 kg, SpO2 96 %.   General: No acute distress Mood and affect are appropriate Heart: Regular rate and rhythm no rubs murmurs or extra sounds Lungs: Clear to auscultation, breathing unlabored, no rales or wheezes Abdomen: Positive bowel sounds, soft nontender to palpation, nondistended Extremities: No clubbing, cyanosis, or edema Skin: No evidence of breakdown, no evidence of rash   Neuro:  pt is awake, alert. Told me he was "in the hospital". Distracted but could attend with verbal cueing. .Moves all 4's at least antigravity. No resting tone. Sensory exam grossly intact Musculoskeletal: cerivcal tenderness.     Assessment/Plan: 1. Functional deficits which require 3+ hours per day of interdisciplinary therapy in a comprehensive inpatient rehab setting. Physiatrist is providing close team supervision and 24 hour management of active medical problems listed below. Physiatrist and rehab team continue to assess barriers to discharge/monitor patient progress toward functional and medical goals  Care Tool:  Bathing    Body parts bathed by patient: Right arm, Left arm, Chest, Abdomen, Face   Body parts bathed by  helper: Front perineal area, Buttocks, Right upper leg, Left upper leg, Right lower leg, Left lower leg     Bathing assist Assist Level: Maximal Assistance - Patient 24 - 49%     Upper Body Dressing/Undressing Upper body dressing   What is the patient wearing?: Button up shirt    Upper body assist Assist Level: Maximal Assistance - Patient 25 - 49%    Lower Body Dressing/Undressing Lower body dressing      What is the patient wearing?: Pants, Incontinence brief     Lower body assist Assist for lower body dressing: 2 Helpers     Toileting Toileting    Toileting assist Assist for toileting: 2 Helpers     Transfers Chair/bed transfer  Transfers assist     Chair/bed transfer assist level: Maximal Assistance - Patient 25 - 49%     Locomotion Ambulation   Ambulation assist   Ambulation activity did not occur: Safety/medical concerns          Walk 10 feet activity   Assist  Walk 10 feet activity did not occur: Safety/medical concerns        Walk 50 feet activity   Assist Walk 50 feet with 2 turns activity did not occur: Safety/medical concerns  Walk 150 feet activity   Assist Walk 150 feet activity did not occur: Safety/medical concerns         Walk 10 feet on uneven surface  activity   Assist Walk 10 feet on uneven surfaces activity did not occur: Safety/medical concerns         Wheelchair     Assist Is the patient using a wheelchair?: Yes Type of Wheelchair: Manual Wheelchair activity did not occur: Safety/medical concerns (limited by cognitive deficits)         Wheelchair 50 feet with 2 turns activity    Assist    Wheelchair 50 feet with 2 turns activity did not occur: Safety/medical concerns       Wheelchair 150 feet activity     Assist  Wheelchair 150 feet activity did not occur: Safety/medical concerns       Blood pressure 131/73, pulse 61, temperature 98.1 F (36.7 C), temperature source  Oral, resp. rate 16, height 5\' 10"  (1.778 m), weight 84.8 kg, SpO2 96 %.    Medical Problem List and Plan: 1. 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  Status post posterior decompression with craniectomy 11/26/2021             -patient may not yet shower             -ELOS/Goals: 18-22 days, supervision to min assist goals  -Continue CIR therapies including PT, OT, and SLP   Long discussion with wife regarding CVA related deficits , timeframe of recovery, trach weaning process  2.  Antithrombotics: -DVT/anticoagulation:  Pharmaceutical: Other (comment) Eliquis             -antiplatelet therapy: N/A 3. Pain Management: Zanaflex 1 mg 3 times daily, Voltaren gel 4 times daily, Fioricet as needed, oxycodone as needed 4. Mood/sleep: Klonopin 0.5 mg nightly, melatonin 5 mg daily as needed             -antipsychotic agents: Zyprexa               -continue sleep chart  1/13 reduced hs zyprexa to 2.5mg  yesterday--probably why he's a little more restless this morning as well as last night   -resume 5mg  at night this was held due to sedation yesterday pm, monitor for agitation  5. Neuropsych: This patient is not capable of making decisions on his own behalf.  -still requires soft wrist restraints for safety/equipiment 6. Skin/Wound Care: Routine skin checks 7. Fluids/Electrolytes/Nutrition: Routine in and outs with follow-up chemistries             -continue current TF  -protein supplementation thru peg for low albumin  -check labs Monday  8.  Prolonged ventilatory support.  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   -Patient downsized to a #6 cuffless Shiley 12/24/2021--tolerating   -decreased to #4 cuffless today -sxn prn Tolerating cap trach during the day. If does well can be capped continuously starting 1/15 9.  Dysphagia.  Status post gastrostomy tube 12/13/2021 per Dr.Lovick. -advance per speech  therapy 10.  Aspiration pneumonia.  Antibiotic therapy completed.  -afebrile  -aspiration precautions 11.  New onset atrial fibrillation.  Started Eliquis 12/17/2021.  Follow-up carotid service.  Continue low-dose beta-blocker.  1/13 HR in 50's 12.  Hypertension.  Norvasc 10 mg daily, Cozaar 50 mg daily, , clonidine 0.1 mg every 8 hours.     -bp well controlled 13.  Hyperlipidemia.  Lipitor 14.  History of BPH/urinary retention.  Urecholine 10 mg 3 times daily  -pt voiding incontinently. One bladder volume checked was 200  1/13 -dc urecholine and observe 15.  CAD with CABG times 03/2011 in [place].  Follow-up per cardiology services. 16. Blood clots in stool:  1/13-blood counts relatively stable over last 4 days       -was 12.3 on 1/11--->Hbg 14.1 on 1/13      -pt is on eliquis     -no GI history other than PEG placement     LOS: 4 days A FACE TO FACE EVALUATION WAS PERFORMED  Charlett Blake 12/29/2021, 10:08 AM

## 2021-12-29 NOTE — Progress Notes (Signed)
Physical Therapy Session Note  Patient Details  Name: [name] MRN: [id number] Date of Birth: 1946-04-12  Today's Date: 12/29/2021 PT Individual Time: 1405-1500 PT Individual Time Calculation (min): 55 min   Short Term Goals: Week 1:  PT Short Term Goal 1 (Week 1): Patient will perform bed mobility with mod A in a flat bed without rails. PT Short Term Goal 2 (Week 1): Patient will perform basic transfers with mod A consistently. PT Short Term Goal 3 (Week 1): Patient will initiate gait training >10 feet.  Skilled Therapeutic Interventions/Progress Updates:    Pt received supine in bed with his wife, [name], present and pt agreeable to therapy session. Pt's trach is now capped. Upon arrival to room pt's continuous HR monitor shows vitals: SpO2 95% and HR 112bpm at rest in bed. Pt's wife reports she has not seen pt's HR go <100bpm since earlier OT session. Nurse reports pt received his heart medications not long before this therapist arrival. Unable to truly determine due to pt's aphasia but pt denies symptoms at this time.   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 Sitting EOB with +2 providing close supervision for trunk safety while therapist donned tennis shoes total assist for time.   Sit>stand with BUE support on therapist's shoulders with mod assist for balance and lifting to stand - R stand pivot to TIS w/c with light mod assist for balance due to increased postural sway from ataxia and therapist facilitating weight shift onto stance limb to allow step on opposite LE.   Transported to/from gym in w/c for time management and energy conservation.  Vitals in gym at rest: SpO2 97% and HR 102bpm.   Sit>stand from w/c>RW, cuing to push up with hands from armrests, with mod assist of 1 primarily for balance due to increased postural sway  biasing lean towards R. Gait training 30ft x2 using RW with mod assist of 1 for managing AD and maintaining balance due to R lean with +2 providing close guarding with more consistent min/mod assist on 2nd walk for balance and to stabilize AD - 1st walk pt achieves reciprocal gait >60% of the time but with R LE scissoring and in excessive hip external rotation, cuing for improvement and then during 2nd walk pt has decreased R foot clearance and step length achieving only a step-to pattern. Pt able to turn and sit in chair at end of each walk with more max assist for balance and AD management while turning.   Vitals after 1st walk: SpO2 98-99% and HR 111bpm-125bpm   Vitals after 2nd walk: SpO2 98% and HR 126bpm initially but then quickly increasing to 140bpms in <30 seconds and only coming down to 116bpm after 39minute but then pt's HR continued to fluctuate in 120s-130s even after 5 minute seated rest taking until 9 minute rest to finally decrease in 50bpms (pt's normal range) with huge fluctuations from 50bpms to 120bpms.Nurse and MD made aware.   Transported pt back to room. L stand pivot TIS w/c>EOB with mod assist as described above primarily for balance due to truncal ataxia. Sit>supine, HOB elevated, with min assist for B LE management into bed. Pt able to scoot up towards [facility] in hooklying position with multimodal cuing and increased time for problem solving. Pt left supine in bed, HOB feature locked >30degrees, needs in reach, soft wrist restraint on R  UE only since pt's wife in the room, lines intact, and bed alarm on.  Therapy Documentation Precautions:  Precautions Precautions: Fall Precaution Comments: trach, peg, B wrist restraints Restrictions Weight Bearing Restrictions: No   Pain: No indications nor complaints of pain throughout session.   Therapy/Group: Individual Therapy  Tawana Scale , PT, DPT, NCS, CSRS  12/29/2021, 12:30 PM

## 2021-12-29 NOTE — Plan of Care (Signed)
°  Problem: Safety: Goal: Non-violent Restraint(s) Outcome: Progressing   Problem: Consults Goal: RH STROKE PATIENT EDUCATION Description: See Patient Education module for education specifics  Outcome: Progressing Goal: Nutrition Consult-if indicated Outcome: Progressing   Problem: RH BOWEL ELIMINATION Goal: RH STG MANAGE BOWEL WITH ASSISTANCE Description: STG Manage Bowel with [place]. Outcome: Progressing Goal: RH STG MANAGE BOWEL W/MEDICATION W/ASSISTANCE Description: STG Manage Bowel with Medication with [place]. Outcome: Progressing   Problem: RH BLADDER ELIMINATION Goal: RH STG MANAGE BLADDER WITH ASSISTANCE Description: STG Manage Bladder With Min Assistance Outcome: Progressing Goal: RH STG MANAGE BLADDER WITH MEDICATION WITH ASSISTANCE Description: STG Manage Bladder With Medication With [place]. Outcome: Progressing   Problem: RH SKIN INTEGRITY Goal: RH STG MAINTAIN SKIN INTEGRITY WITH ASSISTANCE Description: STG Maintain Skin Integrity With [place]. Outcome: Progressing Goal: RH STG ABLE TO PERFORM INCISION/WOUND CARE W/ASSISTANCE Description: STG Able To Perform Incision/Wound Care With [employer]. Outcome: Progressing   Problem: RH SAFETY Goal: RH STG ADHERE TO SAFETY PRECAUTIONS W/ASSISTANCE/DEVICE Description: STG Adhere to Safety Precautions With Cues and Reminders. Outcome: Progressing Goal: RH STG DECREASED RISK OF FALL WITH ASSISTANCE Description: STG Decreased Risk of Fall With [employer]. Outcome: Progressing   Problem: RH COGNITION-NURSING Goal: RH STG USES MEMORY AIDS/STRATEGIES W/ASSIST TO PROBLEM SOLVE Description: STG Uses Memory Aids/Strategies With Min Assistance to Problem Solve. Outcome: Progressing Goal: RH STG ANTICIPATES NEEDS/CALLS FOR ASSIST W/ASSIST/CUES Description: STG Anticipates Needs/Calls for Assist With Min Assistance/Cues. Outcome: Progressing   Problem: RH KNOWLEDGE DEFICIT Goal: RH STG  INCREASE KNOWLEDGE OF DYSPHAGIA/FLUID INTAKE Description: Patient and family with demonstrate knowledge of dysphagia diets and thickened liquids with educational materials and handouts provided by staff independently at discharge. Outcome: Progressing Goal: RH STG INCREASE KNOWLEDGE OF STROKE PROPHYLAXIS Description: Patient will demonstrate knowledge of medications used to prevent future strokes with educational materials and handouts provided by staff independently at discharge. Outcome: Progressing

## 2021-12-30 LAB — URINALYSIS, COMPLETE (UACMP) WITH MICROSCOPIC
Bilirubin Urine: NEGATIVE
Glucose, UA: NEGATIVE mg/dL
Hgb urine dipstick: NEGATIVE
Ketones, ur: NEGATIVE mg/dL
Leukocytes,Ua: NEGATIVE
Nitrite: NEGATIVE
Protein, ur: NEGATIVE mg/dL
Specific Gravity, Urine: 1.015 (ref 1.005–1.030)
pH: 6 (ref 5.0–8.0)

## 2021-12-30 LAB — GLUCOSE, CAPILLARY
Glucose-Capillary: 118 mg/dL — ABNORMAL HIGH (ref 70–99)
Glucose-Capillary: 125 mg/dL — ABNORMAL HIGH (ref 70–99)
Glucose-Capillary: 121 mg/dL — ABNORMAL HIGH (ref 70–99)
Glucose-Capillary: 102 mg/dL — ABNORMAL HIGH (ref 70–99)
Glucose-Capillary: 111 mg/dL — ABNORMAL HIGH (ref 70–99)
Glucose-Capillary: 98 mg/dL (ref 70–99)

## 2021-12-30 MED ORDER — OLANZAPINE 2.5 MG PO TABS
2.5000 mg | ORAL_TABLET | Freq: Every day | ORAL | Status: DC | PRN
  Administered 2021-12-30: 2.5 mg
  Filled 2021-12-30 (×2): qty 1

## 2021-12-30 MED ORDER — CLONAZEPAM 0.25 MG PO TBDP
0.2500 mg | ORAL_TABLET | Freq: Three times a day (TID) | ORAL | Status: DC
  Administered 2021-12-30 – 2022-01-03 (×12): 0.25 mg
  Filled 2021-12-30 (×12): qty 1

## 2021-12-30 NOTE — Progress Notes (Signed)
Patient continues to have bilateral wrist restraints due to puling at peg tube, trac and IV. Able to remove 1 restraint at a time, leaving only 1 restraint on at a time while wife sits at bedside.Passive and active ROM completed as ordered.  Patient has been anxious and agitated at times able to redirect. Straight cath x 1, 500 ml clear yellow urine noted. Mouth care preformed several times throughout the shift, tongue has white patches, denies pain with mouth care.   No S/S of pain at this time. Bed in low position, safety maintained.

## 2021-12-30 NOTE — Progress Notes (Addendum)
PROGRESS NOTE   Subjective/Complaints:  Agitation last noc at ~2am, kicking, verbally abusive to staff, received Zyprexa 37m at hs with klonopin 0.220m, required additional Zyprexa 2.5 mg at 2am   Wife concerned, husband told her to get out of room.  ROS: Limited due to cognitive/behavioral    Objective:   No results found. Recent Labs    12/28/21 1205  WBC 5.9  HGB 14.1  HCT 41.5  PLT 307    No results for input(s): NA, K, CL, CO2, GLUCOSE, BUN, CREATININE, CALCIUM in the last 72 hours.  No intake or output data in the 24 hours ending 12/30/21 0719        Physical Exam: Vital Signs Blood pressure 104/72, pulse 94, temperature 97.8 F (36.6 C), resp. rate 16, height 5' 10"  (1.778 m), weight 84.6 kg, SpO2 90 %.   General: No acute distress Mood and affect are appropriate Heart: Regular rate and rhythm no rubs murmurs or extra sounds Lungs: Clear to auscultation, breathing unlabored, no rales or wheezes Abdomen: Positive bowel sounds, soft nontender to palpation, nondistended Extremities: No clubbing, cyanosis, or edema Skin: No evidence of breakdown, no evidence of rash   Neuro:  pt is awake, alert. Told me he was "in the hospital". Distracted but could attend with verbal cueing. .Moves all 4's at least antigravity. No resting tone. Sensory exam grossly intact Musculoskeletal: cerivcal tenderness.     Assessment/Plan: 1. Functional deficits which require 3+ hours per day of interdisciplinary therapy in a comprehensive inpatient rehab setting. Physiatrist is providing close team supervision and 24 hour management of active medical problems listed below. Physiatrist and rehab team continue to assess barriers to discharge/monitor patient progress toward functional and medical goals  Care Tool:  Bathing    Body parts bathed by patient: Right arm, Left arm, Chest, Abdomen, Face   Body parts bathed by  helper: Front perineal area, Buttocks, Right upper leg, Left upper leg, Right lower leg, Left lower leg     Bathing assist Assist Level: Maximal Assistance - Patient 24 - 49%     Upper Body Dressing/Undressing Upper body dressing   What is the patient wearing?: Pull over shirt    Upper body assist Assist Level: Minimal Assistance - Patient > 75%    Lower Body Dressing/Undressing Lower body dressing      What is the patient wearing?: Pants, Incontinence brief     Lower body assist Assist for lower body dressing: Maximal Assistance - Patient 25 - 49%     Toileting Toileting    Toileting assist Assist for toileting: 2 Helpers     Transfers Chair/bed transfer  Transfers assist     Chair/bed transfer assist level: Moderate Assistance - Patient 50 - 74% (stand pivot)     Locomotion Ambulation   Ambulation assist   Ambulation activity did not occur: Safety/medical concerns  Assist level: 2 helpers (+2 mod A) Assistive device: Walker-rolling Max distance: [id number] Walk 10 feet activity   Assist  Walk 10 feet activity did not occur: Safety/medical concerns        Walk 50 feet activity   Assist Walk 50 feet with 2 turns  activity did not occur: Safety/medical concerns         Walk 150 feet activity   Assist Walk 150 feet activity did not occur: Safety/medical concerns         Walk 10 feet on uneven surface  activity   Assist Walk 10 feet on uneven surfaces activity did not occur: Safety/medical concerns         Wheelchair     Assist Is the patient using a wheelchair?: Yes Type of Wheelchair: Manual Wheelchair activity did not occur: Safety/medical concerns (limited by cognitive deficits)         Wheelchair 50 feet with 2 turns activity    Assist    Wheelchair 50 feet with 2 turns activity did not occur: Safety/medical concerns       Wheelchair 150 feet activity     Assist  Wheelchair 150 feet activity did not  occur: Safety/medical concerns       Blood pressure 104/72, pulse 94, temperature 97.8 F (36.6 C), resp. rate 16, height 5\' 10"  (1.778 m), weight 84.6 kg, SpO2 90 %.    Medical Problem List and Plan: 1. 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  Status post posterior decompression with craniectomy 11/26/2021             -patient may not yet shower             -ELOS/Goals: 18-22 days, supervision to min assist goals  -Continue CIR therapies including PT, OT, and SLP   Long discussion with wife regarding CVA related deficits , timeframe of recovery, trach weaning process  Discussed agitation in setting of intracranial hemorrhage 2.  Antithrombotics: -DVT/anticoagulation:  Pharmaceutical: Other (comment) Eliquis             -antiplatelet therapy: N/A 3. Pain Management: Zanaflex 1 mg 3 times daily, Voltaren gel 4 times daily, Fioricet as needed, oxycodone as needed 4. Mood/sleep: Klonopin 0.5 mg nightly, melatonin 5 mg daily as needed             -antipsychotic agents: Zyprexa               -continue sleep chart  1/13 reduced hs zyprexa to 2.5mg  yesterday--probably why he's a little more restless this morning as well as last night   -resume 5mg  at night this was held due to sedation yesterday pm, agitated last noc requiring additional zyprexa 2.5mg , cont current meds , expect pt to be somnolent today due to altered sleeping pattern  Klonopin dose was reduced from 0.5 mg to 0.25 nightly, some daytime agitation will increase to 0.25 mg 3 times daily 5. Neuropsych: This patient is not capable of making decisions on his own behalf.  -still requires soft wrist restraints for safety/equipiment 6. Skin/Wound Care: Routine skin checks 7. Fluids/Electrolytes/Nutrition: Routine in and outs with follow-up chemistries             -continue current TF  -protein supplementation thru peg for low albumin  -check labs  Monday  8.  Prolonged ventilatory support.  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   -Patient downsized to a #6 cuffless Shiley 12/24/2021--tolerating   -decreased to #4 cuffless today -sxn prn Tolerating cap trach during the day. DId well  capped during the day will continuously starting 1/15 9.  Dysphagia.  Status post gastrostomy tube 12/13/2021 per Dr.Lovick. -advance per speech therapy, hopefully can repeat MBS after trach removed 10.  Aspiration pneumonia.  Antibiotic  therapy completed.  -afebrile  -aspiration precautions 11.  New onset atrial fibrillation.  Started Eliquis 12/17/2021.  Marland Kitchen  Continue low-dose beta-blocker.  1/13 HR in 50's Monitor on current meds Vitals:   12/30/21 0613 12/30/21 0743  BP: 104/72   Pulse: 94 97  Resp:  17  Temp:    SpO2:  95%    12.  Hypertension.  Norvasc 10 mg daily, Cozaar 50 mg daily, , clonidine 0.1 mg every 8 hours.     -bp well controlled 13.  Hyperlipidemia.  Lipitor 14.  History of BPH/urinary retention.  Urecholine 10 mg 3 times daily  -pt voiding incontinently. One bladder volume checked was 200  1/13 -dc urecholine and observe 15.  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  Follow-up per cardiology services. 16. Blood clots in stool:  1/13-blood counts relatively stable over last 4 days       -was 12.3 on 1/11--->Hbg 14.1 on 1/13      -pt is on eliquis     -no GI history other than PEG placement Increasing agitation, mainly sundowning although some residual this morning Last CBC was normal Repeat c-Met in a.m. Check UA CNS may catheterize if patient calm enough to tolerate this LOS: 5 days A FACE TO FACE EVALUATION WAS PERFORMED  Charlett Blake 12/30/2021, 7:19 AM

## 2021-12-30 NOTE — Progress Notes (Signed)
This nurse found pt with legs over the railings and slide down in the bed, trach collar pulled off, and tugging on the peg tube. This nurse and Roselyn Reef, RN attempted to reposition the patient in the bed, when pt became verbally abusive using profanity, and trying to kick the staff. Pt not cooperating with commands from staff, and continues to tell staff "get the fuck out". Emotional support provided to the patient to deescalate the situation, pt continued to kick staff and grab the staff members arms refusing to let go. PRN zyprexa given for agitation.

## 2021-12-30 NOTE — Progress Notes (Signed)
Trach capped per order. Pt tolerating well at this time, SPO2 95% on RA. RN notified and RT will continue to monitor.

## 2021-12-31 ENCOUNTER — Inpatient Hospital Stay (HOSPITAL_COMMUNITY): Payer: Medicare Other

## 2021-12-31 LAB — URINE CULTURE: Culture: NO GROWTH

## 2021-12-31 LAB — GLUCOSE, CAPILLARY
Glucose-Capillary: 150 mg/dL — ABNORMAL HIGH (ref 70–99)
Glucose-Capillary: 133 mg/dL — ABNORMAL HIGH (ref 70–99)
Glucose-Capillary: 158 mg/dL — ABNORMAL HIGH (ref 70–99)
Glucose-Capillary: 123 mg/dL — ABNORMAL HIGH (ref 70–99)
Glucose-Capillary: 85 mg/dL (ref 70–99)

## 2021-12-31 IMAGING — DX DG CHEST 1V PORT
1 series · 1 of 1 positions shown · non-contrast
Comparison: [DATE]

CLINICAL DATA: Hypoxia

EXAM:
PORTABLE CHEST 1 VIEW

[chest]
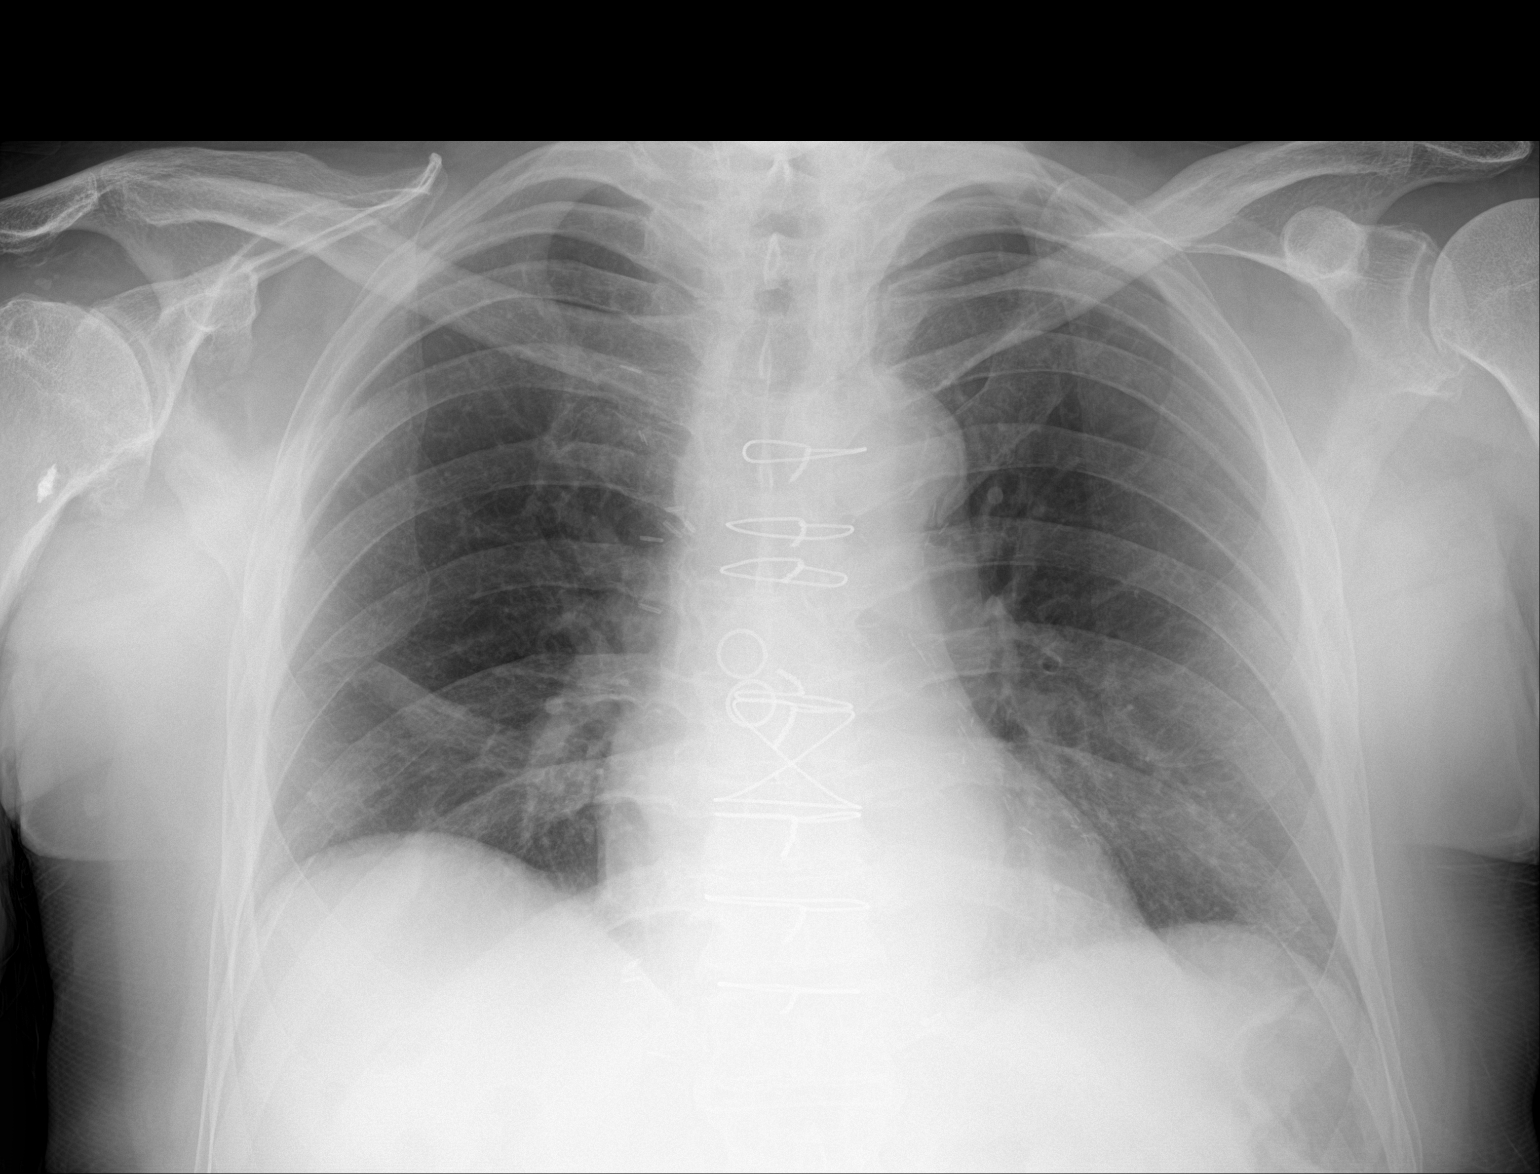

[1 of 1 positions shown; findings below may reference images not displayed]

FINDINGS: Status post median sternotomy and CABG. Both lungs are clear.
Interval removal of tracheostomy appliance. The visualized skeletal
structures are unremarkable.
IMPRESSION: 1.  No acute abnormality of the lungs in AP portable projection.

2.  Interval removal of tracheostomy appliance.

## 2021-12-31 IMAGING — DX DG ABD PORTABLE 1V
2 series · 2 of 2 positions shown · non-contrast
Comparison: [DATE]

CLINICAL DATA: Recent gastrostomy removal

EXAM:
PORTABLE ABDOMEN - 1 VIEW

[abdomen kub (1 of 2)]
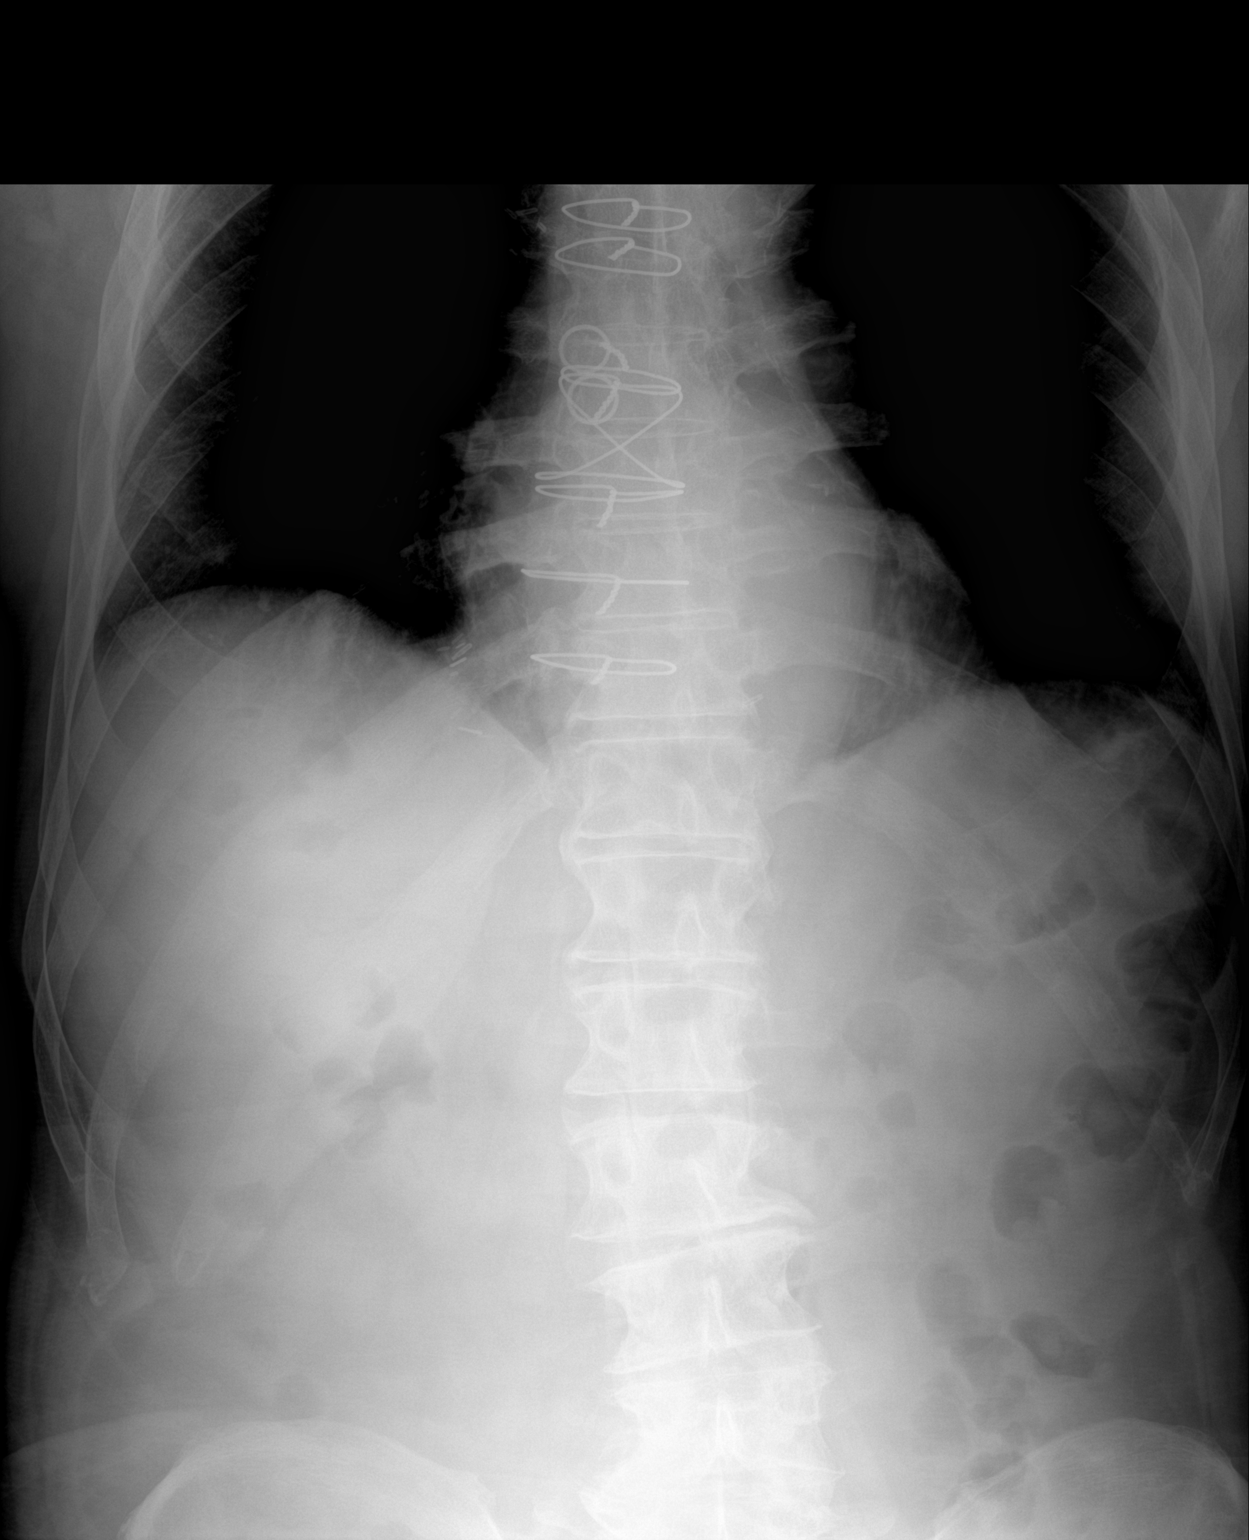

[abdomen kub (2 of 2)]
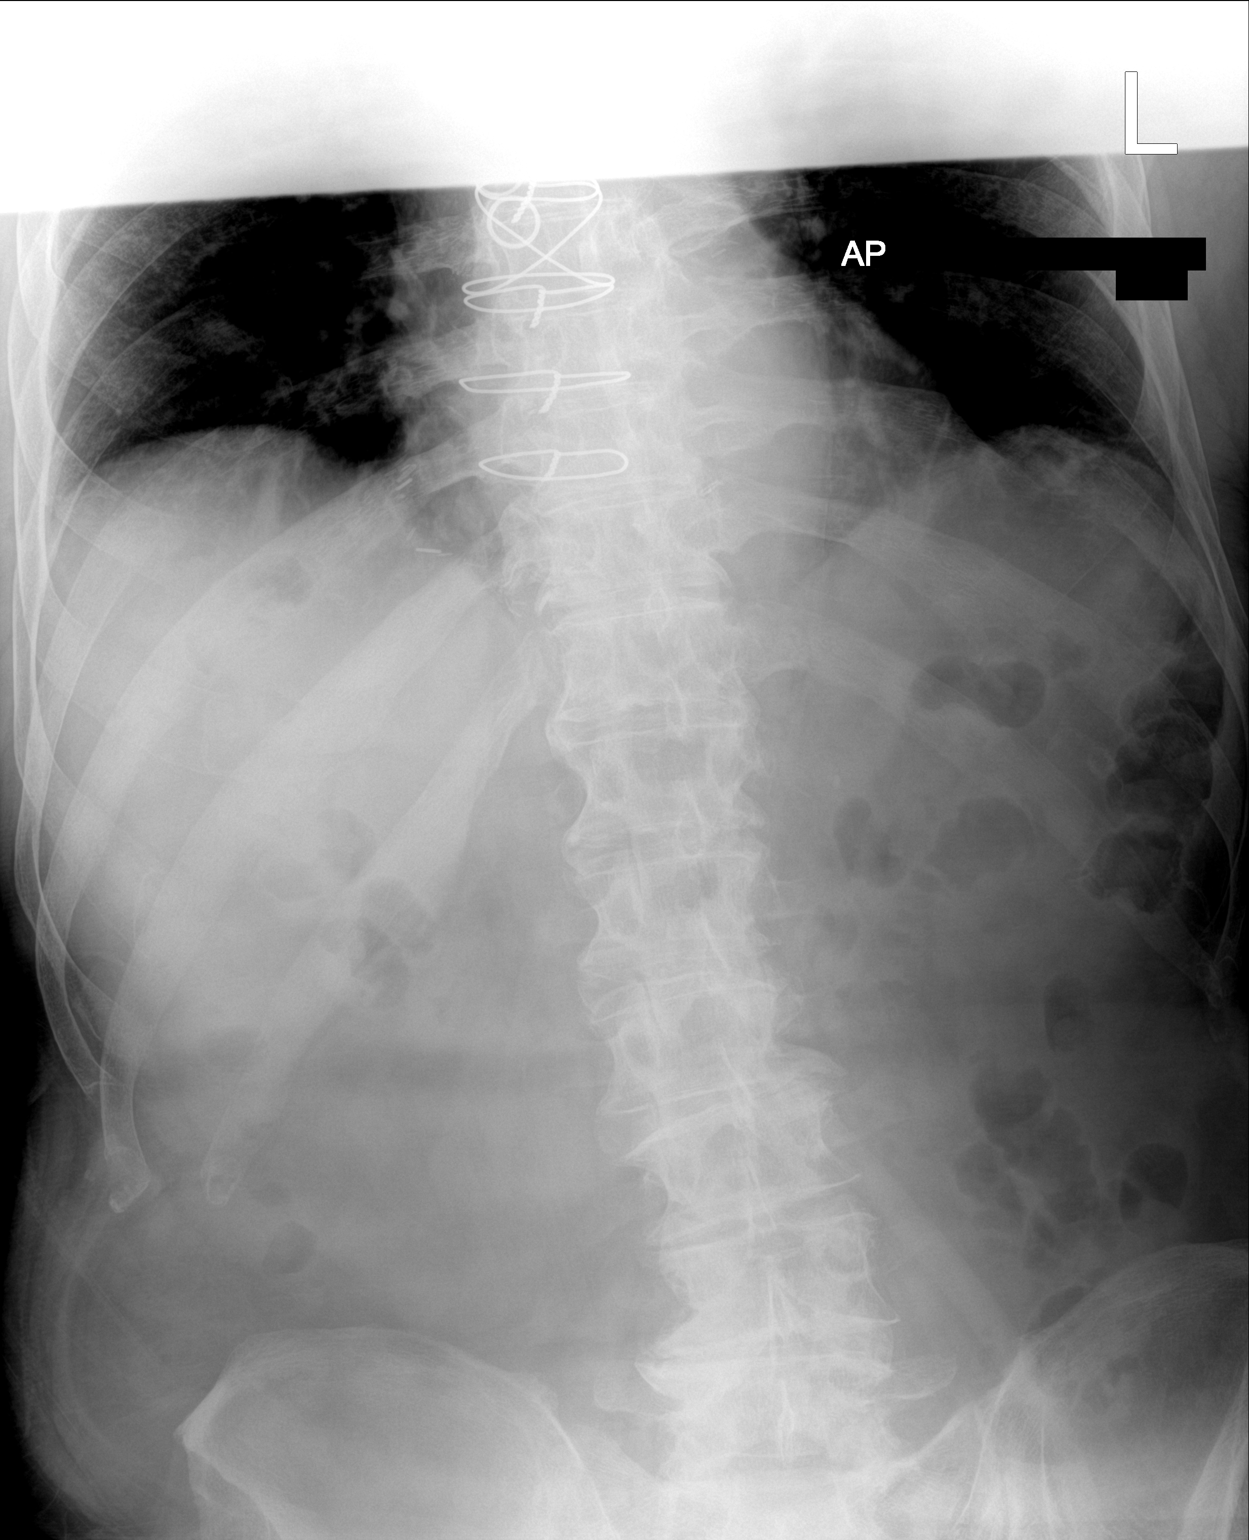

[2 of 2 positions shown; findings below may reference images not displayed]

FINDINGS: Scattered large and small bowel gas is noted. The known Foley
replacement catheter in the gastrostomy site is not well visualized
due to lack of injected contrast. No free air is seen. No bony
abnormality is noted.
IMPRESSION: No acute abnormality noted.

## 2021-12-31 NOTE — Progress Notes (Signed)
Pt still has stable vitals and is not in any respiratory distress following the trach removal. Pt is saturating 95% on RA at this time. Gauze still looks clean and dry. Rt will continue to monitor.

## 2021-12-31 NOTE — Progress Notes (Signed)
Physical Therapy Session Note  Patient Details  Name: [name] MRN: [id number] Date of Birth: 1946/06/27  Today's Date: 12/31/2021 PT Co-Treatment Time: 0800-0830 PT Co-Treatment Time Calculation (min): 30 min  Short Term Goals: Week 1:  PT Short Term Goal 1 (Week 1): Patient will perform bed mobility with mod A in a flat bed without rails. PT Short Term Goal 2 (Week 1): Patient will perform basic transfers with mod A consistently. PT Short Term Goal 3 (Week 1): Patient will initiate gait training >10 feet.  Skilled Therapeutic Interventions/Progress Updates:     Pt seen with OT for skilled co-treatment. Pt received supine in bed and agrees to therapy. No complaint of pain. Bilateral wrist restraints removed. Pt noted to have been incontinent of urine in brief. Supine to sit with modA +1 and cues for positioning at EOB, Pt performs sit to stands with modA +2 and remains standing while performing self- pericare and donning new brief and scrub pants. See OT note for additional skilled details on ADLs. WC transport to gym for time management. Pt performs sit to stand with RW and modA, with cues for hand placement and sequencing. Pt ambulates x25' with RW and modA +2, with PT providing manual stability of RW due to pt ataxia, as well as consistent cues to increase step heigh and stride length on R. Pt verbalizes increasing dizziness so seated rest break taken  BP assessed - 88/70. Pt performs sit to stand to RW and BP attempted to be assessed in standing but pt verbalizes increasing dizziness and has to sit down before pressure is recorded. Abdominal binder and bilateral TED hose in place. Pt left in gym working with OT.   Therapy Documentation Precautions:  Precautions Precautions: Fall Precaution Comments: trach, peg, B wrist restraints Restrictions Weight Bearing Restrictions: No    Therapy/Group: Individual Therapy  Breck Coons, PT, DPT 12/31/2021, 11:19 AM

## 2021-12-31 NOTE — Procedures (Signed)
Tracheostomy Change Note  Patient Details:   Name: [name] DOB: 1946/08/16 MRN: [id number]    Airway Documentation:     Evaluation  O2 sats: stable throughout Complications: No apparent complications Patient did tolerate procedure well. Bilateral Breath Sounds: Clear   Trach was removed per MD order, skin was cleaned/dried, and gauze was taped over stoma site. Pt is on RA, saturating in 92%. No respiratory distress noted at this time. RT will monitor.   Ronaldo Miyamoto 12/31/2021, 11:53 AM

## 2021-12-31 NOTE — Progress Notes (Signed)
Occupational Therapy Session Note  Patient Details  Name: [name] MRN: [id number] Date of Birth: 10-Jan-1946  Session 1:  Today's Date: 12/31/2021 OT Individual Time: 0900-0930 OT Individual Time Calculation (min): 30 min  and Today's Date: 12/31/2021 OT Co-Treatment Time: 0830-0900 OT Co-Treatment Time Calculation (min): 30 min   Session 2:  Today's Date: 12/31/2021 OT Individual Time: 1400-1500 OT Individual Time Calculation (min): 60 min    Short Term Goals: Week 1:  OT Short Term Goal 1 (Week 1): Pt will attend to ADL task for 1 min with min cueing OT Short Term Goal 2 (Week 1): Pt will don shirt with min A OT Short Term Goal 3 (Week 1): Pt will complete toilet transfer with LRAD with mod A OT Short Term Goal 4 (Week 1): Pt will complete bimanual grooming tasks with min A  Skilled Therapeutic Interventions/Progress Updates:    Session 1: 8:00-9:00 co-tx with PT Robina Ade. OT goals addressed included ADL retraining, dynamic standing balance, functional activity tolerance, sequencing, and motor planning.  9:00-9:30 individual OT Pt supine with no c/o pain. He was restless but not agitated. Pt completed bed mobility to EOB with mod A. His brief was soaked with urinary incontinence and he had further incontinence upon standing. 2x brief change sit <> stand with mod A to stand and maintain static standing balance. Pt completed functional mobility to the chair with mod +2 HHA. At the sink he changed his shirt with min A and min cueing for initiation/sequencing. Oral care with mod cueing. Pt was taken to the therapy gym via TIS w/c. He completed 25 ft of functional mobility (see PT note for details) with mod A using the RW. Trach capped throughout session with no concerns of SpO2 drop. He had on teds and abdominal binder. He required cueing for forward gaze, R foot clearance and UE placement/RW management. He c/o dizziness/lightheadedness and upon sitting his BP was assessed- 88/70. Attempted  to get another reading in standing following a rest break but pt was unable to tolerate it. He sat down halfway during reading and it was 96/72. Pt completed forward reaching onto a mirror with focus on visual perception and crossing midline. Pt with poor depth perception and convergence. He then completed the BUE ergometer for 2 4 min intervals to focus on sustained attention and BUE strengthening/endurance. Pt was left sitting up in the w/c with all needs met, chair alarm set. B wrist restraints on.    Session 2: Pt received sitting up in the w/c with no c/o pain. Pt recently decannulated. Pt on 2L O2 via [place]. Removed O2 and he was able to sustain Spo2 >94%. He was taken to the therapy gym via TIS w/c. BP seated 111/69. Pt completed stood from the w/c with mod A and a BP was assessed standing- 75/54. HR 140 bpm. Pt endorses dizziness only when asked but does seem a bit less responsive. After a seated rest break his BP rose to 87/67 and he was asymptomatic. Pt completed a functional reaching activity with focus on depth perception and hand prehension, as well as UE coordination with reaching. He required min cueing for matching/sequencing task. Pt completed a BUE coordination task, building a pipe tree with only min cueing- excellent attention to task and sequencing. 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 Pt returned to his room and was left supine, x-ray staff in room. B wrist restraints on. Peg running.    Therapy Documentation Precautions:  Precautions Precautions: Fall Precaution Comments: trach, peg, B wrist restraints Restrictions Weight Bearing Restrictions: No  Therapy/Group: Individual Therapy  [name] 12/31/2021, 7:27 AM

## 2021-12-31 NOTE — Progress Notes (Signed)
Nurse walked into room for continuous pulse ox alarm going off to see patient with PEG in his hand. Bilateral wrist restraints on. Called charge nurse into room where foley cath was placed, aspirated for placement. Pam, PA notified and x ray order obtained for PEG placement. Nurse left room for less than one minute to grab supplies and patient had ripped off trach dressing. Bilateral wrist restraints still on. [name], PA notified of changes with patient. Awaiting xray for placement.

## 2021-12-31 NOTE — Progress Notes (Signed)
Pt's trach was capped per MD order and placed on RA. Pt is tolerating well with saturations in 94% and no respiratory distress. Inner cannula was changed prior to trach being capped. Rt will monitor throughout the day.

## 2021-12-31 NOTE — Progress Notes (Signed)
PROGRESS NOTE   Subjective/Complaints:  No issues with trach. Tolerating capping. Did have some agitation over weekend. Was better last night. Doing well this morning. Therapy reports nausea when up. I/O cath once yesterday  ROS: Limited due to cognitive/behavioral    Objective:   No results found. Recent Labs    12/28/21 1205  WBC 5.9  HGB 14.1  HCT 41.5  PLT 307   No results for input(s): NA, K, CL, CO2, GLUCOSE, BUN, CREATININE, CALCIUM in the last 72 hours.  No intake or output data in the 24 hours ending 12/31/21 1123        Physical Exam: Vital Signs Blood pressure 109/77, pulse 95, temperature 98.5 F (36.9 C), resp. rate 20, height 5\' 10"  (1.778 m), weight 86.5 kg, SpO2 95 %.   Constitutional: No distress . Vital signs reviewed. HEENT: NCAT, EOMI, oral membranes moist Neck: trach capped. Able to cough up secretions thru mouth . Cardiovascular: RRR without murmur. No JVD    Respiratory/Chest: CTA Bilaterally without wheezes or rales. Normal effort    GI/Abdomen: BS +, non-tender, non-distended, PEG site clean.  Ext: no clubbing, cyanosis, or edema Psych: pleasant and cooperative  Skin: No evidence of breakdown, no evidence of rash Neuro:  pt is awake, alert. Remains distracted. Oriented to person only. Michela Pitcher he was in [place]. Did follow basic commands. Moves all 4's at least antigravity. No resting tone. Sensory exam grossly intact Musculoskeletal: cerivcal tenderness.     Assessment/Plan: 1. Functional deficits which require 3+ hours per day of interdisciplinary therapy in a comprehensive inpatient rehab setting. Physiatrist is providing close team supervision and 24 hour management of active medical problems listed below. Physiatrist and rehab team continue to assess barriers to discharge/monitor patient progress toward functional and medical goals  Care Tool:  Bathing    Body parts bathed by  patient: Right arm, Left arm, Chest, Abdomen, Face   Body parts bathed by helper: Front perineal area, Buttocks, Right upper leg, Left upper leg, Right lower leg, Left lower leg     Bathing assist Assist Level: Maximal Assistance - Patient 24 - 49%     Upper Body Dressing/Undressing Upper body dressing   What is the patient wearing?: Pull over shirt    Upper body assist Assist Level: Minimal Assistance - Patient > 75%    Lower Body Dressing/Undressing Lower body dressing      What is the patient wearing?: Pants, Incontinence brief     Lower body assist Assist for lower body dressing: Maximal Assistance - Patient 25 - 49%     Toileting Toileting    Toileting assist Assist for toileting: 2 Helpers     Transfers Chair/bed transfer  Transfers assist     Chair/bed transfer assist level: Moderate Assistance - Patient 50 - 74% (stand pivot)     Locomotion Ambulation   Ambulation assist   Ambulation activity did not occur: Safety/medical concerns  Assist level: 2 helpers (+2 mod A) Assistive device: Walker-rolling Max distance: 64ft   Walk 10 feet activity   Assist  Walk 10 feet activity did not occur: Safety/medical concerns        Walk 50 feet  activity   Assist Walk 50 feet with 2 turns activity did not occur: Safety/medical concerns         Walk 150 feet activity   Assist Walk 150 feet activity did not occur: Safety/medical concerns         Walk 10 feet on uneven surface  activity   Assist Walk 10 feet on uneven surfaces activity did not occur: Safety/medical concerns         Wheelchair     Assist Is the patient using a wheelchair?: Yes Type of Wheelchair: Manual Wheelchair activity did not occur: Safety/medical concerns (limited by cognitive deficits)         Wheelchair 50 feet with 2 turns activity    Assist    Wheelchair 50 feet with 2 turns activity did not occur: Safety/medical concerns       Wheelchair  150 feet activity     Assist  Wheelchair 150 feet activity did not occur: Safety/medical concerns       Blood pressure 109/77, pulse 95, temperature 98.5 F (36.9 C), resp. rate 20, height 5\' 10"  (1.778 m), weight 86.5 kg, SpO2 95 %.    Medical Problem List and Plan: 1. 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  Status post posterior decompression with craniectomy 11/26/2021             -patient may not yet shower             -ELOS/Goals: 18-22 days, supervision to min assist goals  --Continue CIR therapies including PT, OT, and SLP   2.  Antithrombotics: -DVT/anticoagulation:  Pharmaceutical: Other (comment) Eliquis             -antiplatelet therapy: N/A 3. Pain Management: Zanaflex 1 mg 3 times daily, Voltaren gel 4 times daily, Fioricet as needed, oxycodone as needed 4. Mood/sleep:   melatonin 5 mg daily as needed             -antipsychotic agents: Zyprexa               -continue sleep chart  -restless over weekend  1/16 seems better today  -continue 5mg  zyprexa at bedtime and tid klonopin 0.25mg  5. Neuropsych: This patient is not capable of making decisions on his own behalf.  -still requires soft wrist restraints for safety/equipiment 6. Skin/Wound Care: Routine skin checks 7. Fluids/Electrolytes/Nutrition: Routine in and outs with follow-up chemistries             -continue current TF  -protein supplementation thru peg for low albumin  -check labs Tuesday  8.  Prolonged ventilatory support.  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   -tolerating capping. Decannulate today---occlusive dressing to trach stoma 9.  Dysphagia.  Status post gastrostomy tube 12/13/2021 per Dr.Lovick. -advance per speech therapy,  repeat MBS this week? 10.  Aspiration pneumonia.  Antibiotic therapy completed.  -afebrile  -aspiration precautions 11.  New onset atrial fibrillation.  Started Eliquis  12/17/2021.  Marland Kitchen  Continue low-dose beta-blocker.  1/16 HR controlled Monitor on current meds Vitals:   12/31/21 0748 12/31/21 1058  BP:    Pulse: 88 95  Resp: 16 20  Temp:    SpO2: 96% 95%    12.  Hypertension.  Norvasc 10 mg daily, Cozaar 50 mg daily, , clonidine 0.1 mg every 8 hours.     -bp well controlled 13.  Hyperlipidemia.  Lipitor 14.  History of BPH/urinary retention.  Urecholine 10 mg 3  times daily  -pt voiding incontinently. One bladder volume checked was 200  1/13 -dc'ed urecholine-generally has been voiding 15.  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  Follow-up per cardiology services. 16. Blood clots in stool:  1/16-blood counts relatively stable        -was 12.3 on 1/11--->Hbg 14.1 on 1/13--recheck Tuesday      -pt is on eliquis     -no GI history other than PEG placement   -ua negative, ucx pending   LOS: 6 days A FACE TO FACE EVALUATION WAS PERFORMED  Meredith Staggers 12/31/2021, 11:23 AM

## 2021-12-31 NOTE — Progress Notes (Signed)
Speech Language Pathology Daily Session Note  Patient Details  Name: [name] MRN: [id number] Date of Birth: [birth year]  Today's Date: 12/31/2021 SLP Individual Time: 1300-1400 SLP Individual Time Calculation (min): 60 min  Short Term Goals: Week 1: SLP Short Term Goal 1 (Week 1): Patient will sustain attention to functional tasks for 5 minutes with Mod verbal cues for redirection. SLP Short Term Goal 2 (Week 1): Patient will initiate functional tasks in 50% of opportunities with Mod verbal cues. SLP Short Term Goal 3 (Week 1): Patient will verbalize wants/needs at the phrase level in 50% of opportunities with Mod verbal cues. SLP Short Term Goal 4 (Week 1): Patient will demonstrate orientaton to place, time and situation with Mod verbal and visual cues. SLP Short Term Goal 5 (Week 1): Patient will consume trials of ice chips with minimal overt s/s of aspiration and Min verbal cues for attention to bolus over 2 sessions to assess readiness for repeat MBS.  Skilled Therapeutic Interventions: Skilled treatment session focused on cognitive goals. Upon arrival, patient was awake in the wheelchair and requested to use the bathroom. Patient was transferred to the [facility] via the [facility] with +2 assist. Mod verbal cues were needed for problem solving and safety with task. Patient was continent of bladder and a very small bowel movement. While sitting on BSC, patient's O2 saturations dropped to 85% with his HR was 145 based on readings from his continuous pulse ox. SLP utilized a dynamap to compare readings. Patient's O2 saturations remained at 85% with his HR eventually dropping to 105. Physician present and patient placed on 2L via nasal cannula. SLP administered portions of the [facility] Mental Status Examination (SLUMS).  Patient was oriented to the day of the week and state but required cues for orientation to year. Patient completed a generative naming task and named 5 animals in 1 minute.  Patient required total A for problem solving with basic math despite verbal and visual cues.  Patient also unable to complete the clock drawing task despite attempts. Patient demonstrated appropriate awareness regarding decreased legibility. Patient left upright in wheelchair with sister present. Continue with current plan of care.   Pain No/Denies Pain   Therapy/Group: Individual Therapy  Gaynel Schaafsma, [place] 12/31/2021, 3:17 PM

## 2022-01-01 LAB — GLUCOSE, CAPILLARY
Glucose-Capillary: 102 mg/dL — ABNORMAL HIGH (ref 70–99)
Glucose-Capillary: 107 mg/dL — ABNORMAL HIGH (ref 70–99)
Glucose-Capillary: 116 mg/dL — ABNORMAL HIGH (ref 70–99)
Glucose-Capillary: 140 mg/dL — ABNORMAL HIGH (ref 70–99)
Glucose-Capillary: 162 mg/dL — ABNORMAL HIGH (ref 70–99)
Glucose-Capillary: 99 mg/dL (ref 70–99)

## 2022-01-01 LAB — BASIC METABOLIC PANEL
Anion gap: 10 (ref 5–15)
BUN: 19 mg/dL (ref 8–23)
CO2: 27 mmol/L (ref 22–32)
Calcium: 8.8 mg/dL — ABNORMAL LOW (ref 8.9–10.3)
Chloride: 98 mmol/L (ref 98–111)
Creatinine, Ser: 0.83 mg/dL (ref 0.61–1.24)
GFR, Estimated: >60 mL/min (ref >60)
Glucose, Bld: 145 mg/dL — ABNORMAL HIGH (ref 70–99)
Potassium: 4 mmol/L (ref 3.5–5.1)
Sodium: 135 mmol/L (ref 135–145)

## 2022-01-01 LAB — CBC
HCT: 42.6 % (ref 39.0–52.0)
Hemoglobin: 14.4 g/dL (ref 13.0–17.0)
MCH: 31 pg (ref 26.0–34.0)
MCHC: 33.8 g/dL (ref 30.0–36.0)
MCV: 91.6 fL (ref 80.0–100.0)
Platelets: 292 10*3/uL (ref 150–400)
RBC: 4.65 MIL/uL (ref 4.22–5.81)
RDW: 15 % (ref 11.5–15.5)
WBC: 8.3 10*3/uL (ref 4.0–10.5)
nRBC: 0 % (ref 0.0–0.2)

## 2022-01-01 MED ORDER — QUETIAPINE FUMARATE 50 MG PO TABS
50.0000 mg | ORAL_TABLET | Freq: Every evening | ORAL | Status: DC | PRN
  Administered 2022-01-06: 50 mg
  Filled 2022-01-01: qty 1

## 2022-01-01 MED ORDER — QUETIAPINE FUMARATE 50 MG PO TABS
100.0000 mg | ORAL_TABLET | Freq: Every day | ORAL | Status: DC
  Administered 2022-01-01 – 2022-01-11 (×11): 100 mg
  Filled 2022-01-01 (×12): qty 2

## 2022-01-01 MED ORDER — OXYCODONE HCL 5 MG PO TABS
2.5000 mg | ORAL_TABLET | Freq: Four times a day (QID) | ORAL | Status: DC | PRN
  Administered 2022-01-08 – 2022-01-27 (×4): 2.5 mg
  Filled 2022-01-01 (×6): qty 1

## 2022-01-01 NOTE — Progress Notes (Signed)
Patient ID: [name], male   DOB: Aug 07, 1946, 76 y.o.   MRN: [id number]  SW met with pt and pt son Ezariah in room to provide updates from team conference, and d/c date 2/8. Pt son aware SW to follow-up with his mom.   [place] spoke with pt wife [name] [phone]) to provide updates from team conference. SW informed will continue to monitor if pt will require TF at time of d/c. SW will continue to provide updates after team conference.   [name], MSW, [place] Office: [phone] Cell: [phone] Fax: [phone]

## 2022-01-01 NOTE — Progress Notes (Signed)
Pts tube feed adjusted to 65 ml/hr. Pt tolerating increase at this time.

## 2022-01-01 NOTE — Progress Notes (Signed)
Occupational Therapy Session Note  Patient Details  Name: [name] MRN: [id number] Date of Birth: 1946/03/04  Today's Date: 01/01/2022 OT Individual Time: 6553-7482 OT Individual Time Calculation (min): 55 min   Short Term Goals: Week 1:  OT Short Term Goal 1 (Week 1): Pt will attend to ADL task for 1 min with min cueing OT Short Term Goal 2 (Week 1): Pt will don shirt with min A OT Short Term Goal 3 (Week 1): Pt will complete toilet transfer with LRAD with mod A OT Short Term Goal 4 (Week 1): Pt will complete bimanual grooming tasks with min A  Skilled Therapeutic Interventions/Progress Updates:    Pt greeted semi-reclined in bed with son present, pt asleep, but easy to wake and agreeable to OT treatment session. Nursing entered room to remove patient from continuous tube feed. Pt completed bed mobility with moderate verbal cues and mod A. Stand-pivot transfer from bed to TIS wc with mod A +2. Pt brought to the sink and addressed bathing/dressing tasks. Pt needed multimodal cues to initiate and was upper body thoroughly. Pt with tendency to stop after washing one body part. OT encouraged pt to shave, initially pt refusing to try himself. Pt needed hand over hand to keep electric razor on facial hair. Pt unable to sustain attention to task requiring max A from OT for shaving. Sit<>stand at the sink with moderate assistance of 2. Pt perseverating on reaching for something on the wall requiring max multimodal cues and mirror feedback + mod A of 2 to remain standing. Pt incontinent of urine in standing when going to change brief. Pt unable to safely remove unilateral UE from the sink to assist with clothing management requiring max A for peri-care and brief change. Pt brought to therapy gym and worked on R fine motor skills and problem solving with 3 color pattern using large pegs. Pt able to name colors and complete 3 color pattern with min cues. SpO2 maintained at 93% and above on room air  throughout session with activity. Pt returned to room and left in TIS wc with alarm belt on, B wrist restraints, son present and needs met.  Therapy Documentation Precautions:  Precautions Precautions: Fall Precaution Comments: trach, peg, B wrist restraints Restrictions Weight Bearing Restrictions: No Pain: Pain Assessment Pain Scale: Faces Faces Pain Scale: No hurt  Therapy/Group: Individual Therapy  Valma Cava 01/01/2022, 10:01 AM

## 2022-01-01 NOTE — Progress Notes (Signed)
Speech Language Pathology Daily Session Note  Patient Details  Name: Gor Vestal MRN: [id number] Date of Birth: [birth year]  Today's Date: 01/01/2022 SLP Individual Time: 0720-0805 SLP Individual Time Calculation (min): 45 min  Short Term Goals: Week 1: SLP Short Term Goal 1 (Week 1): Patient will sustain attention to functional tasks for 5 minutes with Mod verbal cues for redirection. SLP Short Term Goal 2 (Week 1): Patient will initiate functional tasks in 50% of opportunities with Mod verbal cues. SLP Short Term Goal 3 (Week 1): Patient will verbalize wants/needs at the phrase level in 50% of opportunities with Mod verbal cues. SLP Short Term Goal 4 (Week 1): Patient will demonstrate orientaton to place, time and situation with Mod verbal and visual cues. SLP Short Term Goal 5 (Week 1): Patient will consume trials of ice chips with minimal overt s/s of aspiration and Min verbal cues for attention to bolus over 2 sessions to assess readiness for repeat MBS.  Skilled Therapeutic Interventions: Skilled treatment session focused on dysphagia and cognitive goals. Upon arrival, patient appeared lethargic and reported poor sleep. Patient with decreased speech intelligibility today due to air escaping from stoma despite a dressing requiring multiple repetitions. Patient with frequent language of confusion but was easily redirected. Patient was disoriented to place and situation, therefore, SLP provided visual aids to maximize recall and carryover of information. SLP provided oral care via the suction toothbrush prior to trials of ice chips. Patient required Min verbal cues for awareness of bolus, suspect due to fatigue resulting in intermittent and delayed coughing episodes. Recommend patient remain NPO with trials from SLP. Also recommend repeat MBS tomorrow to assess swallow function. Patient left upright in bed with restraints in place, alarm on and all needs within reach. Continue with current plan of  care.   Pain Pain Assessment Pain Scale: Faces Faces Pain Scale: No hurt  Therapy/Group: Individual Therapy  Keiston Manley 01/01/2022, 9:16 AM

## 2022-01-01 NOTE — Progress Notes (Signed)
Physical Therapy Session Note  Patient Details  Name: [name] MRN: [id number] Date of Birth: 02-07-1946  Today's Date: 01/01/2022 PT Individual Time: 6962-9528 PT Individual Time Calculation (min): 29 min   Short Term Goals: Week 1:  PT Short Term Goal 1 (Week 1): Patient will perform bed mobility with mod A in a flat bed without rails. PT Short Term Goal 2 (Week 1): Patient will perform basic transfers with mod A consistently. PT Short Term Goal 3 (Week 1): Patient will initiate gait training >10 feet.  Skilled Therapeutic Interventions/Progress Updates:     Pt received attempting to stand with RN staff using [place]. Handed off to PT. Pt agreeable to therapy and does not complain of pain. Stedy removed. Pt performs sit to stand with modA and stand step transfer to [facility] with maxA and pt demonstrating ataxic stepping with bilateral lower extremities, R>L. Pt is more confused than previous session with this PT, and perseverates on pizza and paying the pizza delivery man. Pt does not appear to understand that he is on a BSC, so PT and tech provide modA +2 3 musketeers technique to ambulate to actual toilet in restroom. Pt has a little flatulence but does not urinate or defecate. Pt attempting to stand up from toilet on his own. PT is able to redirect pt and with tech, pt ambulates x75' with modA +2 and consistent cues to increase step height and stride length with R leg. +3 for WC follow. Pt performs additional stand with modA +2 to don clean brief following ambulation. Left seated in tilt in space WC with alarm and wrist restraints intact. Son present.  Therapy Documentation Precautions:  Precautions Precautions: Fall Precaution Comments: trach, peg, B wrist restraints Restrictions Weight Bearing Restrictions: No  Therapy/Group: Individual Therapy  Breck Coons, PT, DPT 01/01/2022, 3:53 PM

## 2022-01-01 NOTE — Progress Notes (Signed)
PROGRESS NOTE   Subjective/Complaints:  Pt pulled out PEG last night. Foley inserted in its place. RN says it's functioning well. Pt doesn't recall pulling it out. A little drowsy from receiving zyprexa at 0200  ROS: Limited due to cognitive/behavioral    Objective:   DG CHEST PORT 1 VIEW  Result Date: 12/31/2021 CLINICAL DATA:  Hypoxia EXAM: PORTABLE CHEST 1 VIEW COMPARISON:  12/25/2021 FINDINGS: Status post median sternotomy and CABG. Both lungs are clear. Interval removal of tracheostomy appliance. The visualized skeletal structures are unremarkable. IMPRESSION: 1.  No acute abnormality of the lungs in AP portable projection. 2.  Interval removal of tracheostomy appliance. Electronically Signed   By: Delanna Ahmadi M.D.   On: 12/31/2021 15:18   DG Abd Portable 1V  Result Date: 12/31/2021 CLINICAL DATA:  Recent gastrostomy removal EXAM: PORTABLE ABDOMEN - 1 VIEW COMPARISON:  12/12/2021 FINDINGS: Scattered large and small bowel gas is noted. The known Foley replacement catheter in the gastrostomy site is not well visualized due to lack of injected contrast. No free air is seen. No bony abnormality is noted. IMPRESSION: No acute abnormality noted. Electronically Signed   By: Inez Catalina M.D.   On: 12/31/2021 20:41   Recent Labs    01/01/22 0526  WBC 8.3  HGB 14.4  HCT 42.6  PLT 292   Recent Labs    01/01/22 0526  NA 135  K 4.0  CL 98  CO2 27  GLUCOSE 145*  BUN 19  CREATININE 0.83  CALCIUM 8.8*    No intake or output data in the 24 hours ending 01/01/22 1238        Physical Exam: Vital Signs Blood pressure 106/74, pulse 91, temperature 98.2 F (36.8 C), resp. rate 16, height 5\' 10"  (1.778 m), weight 86.5 kg, SpO2 95 %.   Constitutional: No distress . Vital signs reviewed. HEENT: NCAT, EOMI, oral membranes moist Neck: supple, stoma closing, still some air Cardiovascular: RRR without murmur. No JVD     Respiratory/Chest: CTA Bilaterally without wheezes or rales. Normal effort    GI/Abdomen: BS +, non-tender, non-distended, foley in place, no drainage or blood. Ext: no clubbing, cyanosis, or edema Psych: pleasant and cooperative   Skin: No evidence of breakdown, no evidence of rash. Surgical scar CDI Neuro:  more drowsy this morning. Speech slurred. Oriented to self only. Moves all 4's at least antigravity. No resting tone. Sensory exam grossly intact Musculoskeletal: cerivcal tenderness. present     Assessment/Plan: 1. Functional deficits which require 3+ hours per day of interdisciplinary therapy in a comprehensive inpatient rehab setting. Physiatrist is providing close team supervision and 24 hour management of active medical problems listed below. Physiatrist and rehab team continue to assess barriers to discharge/monitor patient progress toward functional and medical goals  Care Tool:  Bathing    Body parts bathed by patient: Right arm, Left arm, Chest, Abdomen, Face   Body parts bathed by helper: Front perineal area, Buttocks, Right upper leg, Left upper leg, Right lower leg, Left lower leg     Bathing assist Assist Level: Maximal Assistance - Patient 24 - 49%     Upper Body Dressing/Undressing Upper body dressing  What is the patient wearing?: Pull over shirt    Upper body assist Assist Level: Minimal Assistance - Patient > 75%    Lower Body Dressing/Undressing Lower body dressing      What is the patient wearing?: Pants, Incontinence brief     Lower body assist Assist for lower body dressing: Maximal Assistance - Patient 25 - 49%     Toileting Toileting    Toileting assist Assist for toileting: 2 Helpers     Transfers Chair/bed transfer  Transfers assist     Chair/bed transfer assist level: Moderate Assistance - Patient 50 - 74% (stand pivot)     Locomotion Ambulation   Ambulation assist   Ambulation activity did not occur: Safety/medical  concerns  Assist level: 2 helpers (+2 mod A) Assistive device: Walker-rolling Max distance: 98ft   Walk 10 feet activity   Assist  Walk 10 feet activity did not occur: Safety/medical concerns        Walk 50 feet activity   Assist Walk 50 feet with 2 turns activity did not occur: Safety/medical concerns         Walk 150 feet activity   Assist Walk 150 feet activity did not occur: Safety/medical concerns         Walk 10 feet on uneven surface  activity   Assist Walk 10 feet on uneven surfaces activity did not occur: Safety/medical concerns         Wheelchair     Assist Is the patient using a wheelchair?: Yes Type of Wheelchair: Manual Wheelchair activity did not occur: Safety/medical concerns (limited by cognitive deficits)         Wheelchair 50 feet with 2 turns activity    Assist    Wheelchair 50 feet with 2 turns activity did not occur: Safety/medical concerns       Wheelchair 150 feet activity     Assist  Wheelchair 150 feet activity did not occur: Safety/medical concerns       Blood pressure 106/74, pulse 91, temperature 98.2 F (36.8 C), resp. rate 16, height 5\' 10"  (1.778 m), weight 86.5 kg, SpO2 95 %.    Medical Problem List and Plan: 1. 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  Status post posterior decompression with craniectomy 11/26/2021             -patient may not yet shower             -ELOS/Goals: 18-22 days, supervision to min assist goals  -Continue CIR therapies including PT, OT, and SLP. Interdisciplinary team conference today to discuss goals, barriers to discharge, and dc planning.   2.  Antithrombotics: -DVT/anticoagulation:  Pharmaceutical: Other (comment) Eliquis             -antiplatelet therapy: N/A 3. Pain Management: Zanaflex 1 mg 3 times daily, Voltaren gel 4 times daily, Fioricet as needed, oxycodone as needed 4.  Mood/sleep:   melatonin 5 mg daily as needed             -antipsychotic agents: Zyprexa               -continue sleep chart  1/17 restless last night again, pulled out PEG  -will dc zyprexa as he really hasn't done well with this. Begin trial of seroquel.   -continue low dose klonopin 5. Neuropsych: This patient is not capable of making decisions on his own behalf.  -still requires soft wrist restraints for safety/equipiment 6.  Skin/Wound Care: Routine skin checks 7. Fluids/Electrolytes/Nutrition: Routine in and outs with follow-up chemistries             -continue current TF  -protein supplementation thru peg for low albumin  1/17-I personally reviewed the patient's labs today.  They are reasonable    -RN says that foley is functioning fine. Will stay with it for now 8.  Prolonged ventilatory support.  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     Decannulated 1/16---occlusive dressing to trach stoma 9.  Dysphagia.  Status post gastrostomy tube 12/13/2021 per Dr.Lovick. -advance per speech therapy,  repeat MBS this week  10.  Aspiration pneumonia.  Antibiotic therapy completed.  -afebrile  -aspiration precautions  -hypoxic 1/16, cxr reviewed and unremarkable  -O2 as needed  -OOB, IS as possible 11.  New onset atrial fibrillation.  Started Eliquis 12/17/2021.  Marland Kitchen  Continue low-dose beta-blocker.  1/17 HR controlled Monitor on current meds Vitals:   01/01/22 0628 01/01/22 0900  BP: 106/74   Pulse: 93 91  Resp: 18 16  Temp: 98.2 F (36.8 C)   SpO2: 96% 95%    12.  Hypertension.  Norvasc 10 mg daily, Cozaar 50 mg daily, , clonidine 0.1 mg every 8 hours.     -bp well controlled 13.  Hyperlipidemia.  Lipitor 14.  History of BPH/urinary retention.       1/17 - -generally has been voiding but is incontinent 15.  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  Follow-up per cardiology services. 16. Blood clots in stool:  1/16-blood counts relatively stable         -was 12.3 on 1/11--->Hbg 14.1 on 1/13->14.4 1/17      -pt is on eliquis     -no GI history other than PEG placement   -ua negative, ucx negative   LOS: 7 days A FACE TO FACE EVALUATION WAS PERFORMED  Meredith Staggers 01/01/2022, 12:38 PM

## 2022-01-01 NOTE — Patient Care Conference (Signed)
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 Alger Simons Social Worker Present: [name], [place] Nurse Present: Dorien Chihuahua, RN PT Present: Canary Brim, PT OT Present: Willeen Cass, OT SLP Present: Weston Anna, SLP PPS Coordinator present : Gunnar Fusi, SLP     Current Status/Progress Goal Weekly Team Focus  Bowel/Bladder     Incontinent of bowel and bladder   Continent   Toileting protocol  Swallow/Nutrition/ Hydration   NPO with PEG, trials with SLP  Min A  Repeat MBS tomorrow   ADL's   Min A UB, max A LB ADLs, mod-max A transfers. Not oriented, confused.  Supervision to CGA overall  Motor planning/sequencing, ADl retraining, transfers, endurance   Mobility   modA bed mobility, modA +2 sit to stand and gait ~60' with HHA or RW.  CGA  balance, ambulation, safety awraeness, OOB tolerance   Communication   Patient is currently decannulated, Min A for verbal expression  Supervision  initiation of verbal expression, increased vocal intensity   Safety/Cognition/ Behavioral Observations  Max A  Min A  sustained attention, awareness, safety   Pain     N/A        Skin     Peg pulled out; temp. catheter placed. Lurline Idol out; dressing to stoma site.   Skin healing   Monitor skin q shift and cleanse/dress site appropriately    Discharge Planning:      Team Discussion: Patient pulled PEG; Temporary  catheter placed pending replacement of PEG. Continuous TF for now with bolus feeds planned post MBS 01/02/22 findings. Patient with agitation mostly at HS, function limited by ataxia, depth perception deficits, variable alertness and significant language of confusion. Patient also with symptomatic orthostasis with ambulation even when wearing TEDs and a abdominal binder. Desaturation post trach decannulation resolved however some coughing noted with bedside po trials.   Patient on target to meet rehab goals: yes, slow steady progress noted. Currently needs min assist for upper body care nad max assist for lower body care. Needs mod assist + 2 for transfers and able to ambulate up to 30-60'.  *See Care Plan and progress notes for long and short-term goals.   Revisions to Treatment Plan:  Decannulation of trach 12/31/21 MBS 01/02/22   Teaching Needs: Safety, nutrition management, medication management, toileting, ambulation, etc.  Current Barriers to Discharge: Decreased caregiver support, Incontinence, and Nutritional means  Possible Resolutions to Barriers: Family education     Medical Summary Current Status: improved arousal, but still agitated at times. trach out. pulled peg last night. still on TF  Barriers to Discharge: Medical stability   Possible Resolutions to [employer] Focus: daily assessment of patient labs and data, re-establish sleep wake cycle. liberate from TF?   Continued Need for Acute Rehabilitation Level of Care: The patient requires daily medical management by a physician with specialized training in physical medicine and rehabilitation for the following  reasons: Direction of a multidisciplinary physical rehabilitation program to maximize functional independence : Yes Medical management of patient stability for increased activity during participation in an intensive rehabilitation regime.: Yes Analysis of laboratory values and/or radiology reports with any  subsequent need for medication adjustment and/or medical intervention. : Yes   I attest that I was present, lead the team conference, and concur with the assessment and plan of the team.   Dorien Chihuahua B 01/01/2022, 1:24 PM

## 2022-01-01 NOTE — Progress Notes (Signed)
Occupational Therapy Session Note  Patient Details  Name: [name] MRN: [id number] Date of Birth: [birth year]  Today's Date: 01/01/2022 OT Individual Time: 1130-1155 OT Individual Time Calculation (min): 25 min    Short Term Goals: Week 1:  OT Short Term Goal 1 (Week 1): Pt will attend to ADL task for 1 min with min cueing OT Short Term Goal 2 (Week 1): Pt will don shirt with min A OT Short Term Goal 3 (Week 1): Pt will complete toilet transfer with LRAD with mod A OT Short Term Goal 4 (Week 1): Pt will complete bimanual grooming tasks with min A  Skilled Therapeutic Interventions/Progress Updates:    Pt resting in TIS w/c upon arrival with son present. OT intervention with focus on color recognition, [name] with large colored pegs, pattern replication, following commands, and safety awareness. Pt transported to day room for table tasks with colored peg board. Pt requested colored pegs correctly and placed in peg board (easy and medium difficulty-two colores and five colors) with min verbal cues. Pt undershoots pegs when reaching for them but completed two patterns. Pt returned to room and remained in [name]. Wrist restraints secured. Belt alarm secured. Son present.  Therapy Documentation Precautions:  Precautions Precautions: Fall Precaution Comments: trach, peg, B wrist restraints Restrictions Weight Bearing Restrictions: No Pain: Pain Assessment Pain Scale: Faces Faces Pain Scale: No hurt    Therapy/Group: Individual Therapy  Leroy Libman 01/01/2022, 11:55 AM

## 2022-01-02 ENCOUNTER — Other Ambulatory Visit (HOSPITAL_COMMUNITY): Payer: Self-pay

## 2022-01-02 ENCOUNTER — Inpatient Hospital Stay (HOSPITAL_COMMUNITY): Payer: Medicare Other

## 2022-01-02 LAB — GLUCOSE, CAPILLARY
Glucose-Capillary: 108 mg/dL — ABNORMAL HIGH (ref 70–99)
Glucose-Capillary: 109 mg/dL — ABNORMAL HIGH (ref 70–99)
Glucose-Capillary: 116 mg/dL — ABNORMAL HIGH (ref 70–99)
Glucose-Capillary: 139 mg/dL — ABNORMAL HIGH (ref 70–99)
Glucose-Capillary: 95 mg/dL (ref 70–99)
Glucose-Capillary: 99 mg/dL (ref 70–99)

## 2022-01-02 IMAGING — CT CT HEAD W/O CM
3 series · 14 of 47 positions shown, 16 images · non-contrast
Comparison: [DATE].

CLINICAL DATA: Postop craniotomy, worsening right-sided weakness.
History of cerebellar bleed.



[Series 3: head 5.0 mpr ax · axial · 0.35mm/px · z∈[-298,-163]mm · 8 of 33 slices shown, 10 images]
[im 3/33  brain]
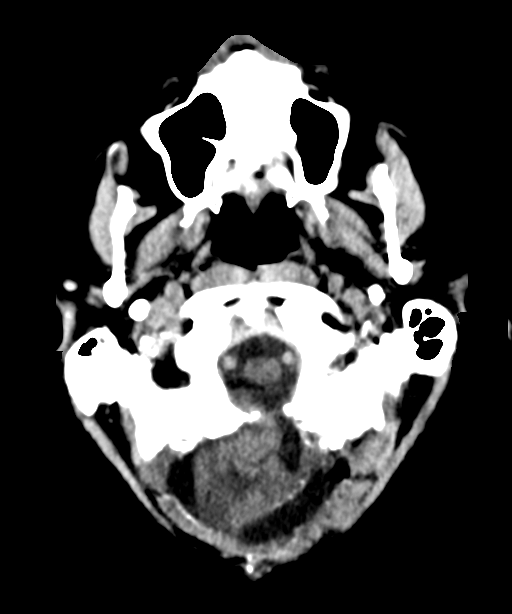
[im 3/33  bone]
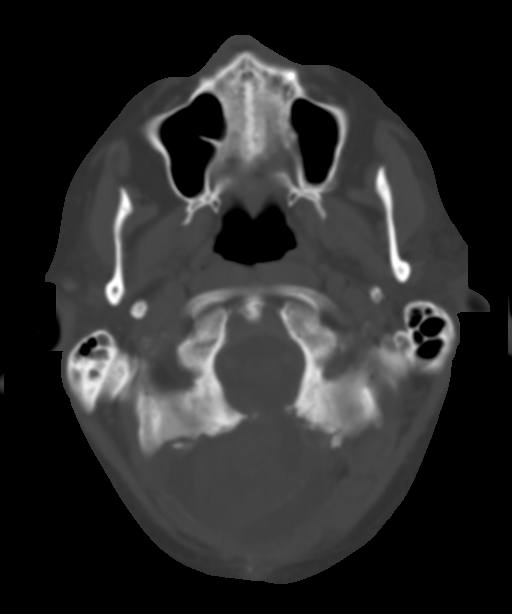
[im 7/33  brain]
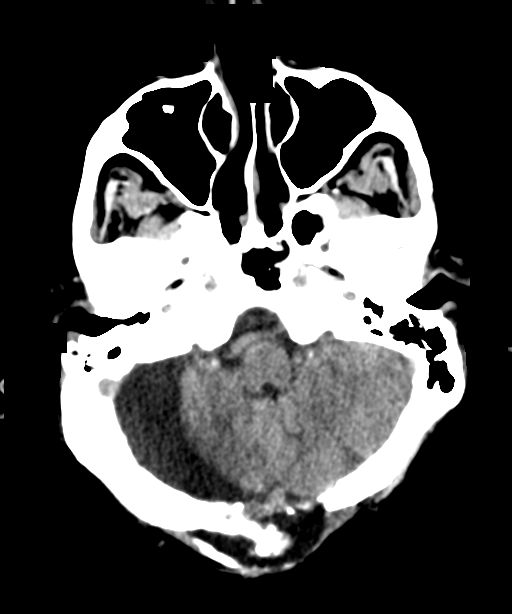
[im 10/33  brain]
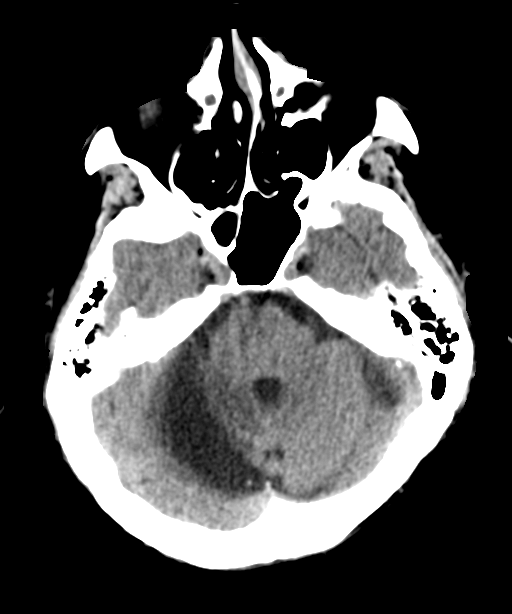
[im 15/33  brain]
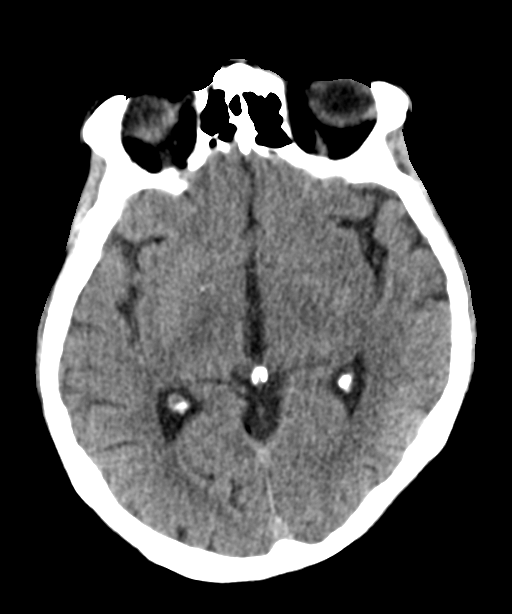
[im 18/33  brain]
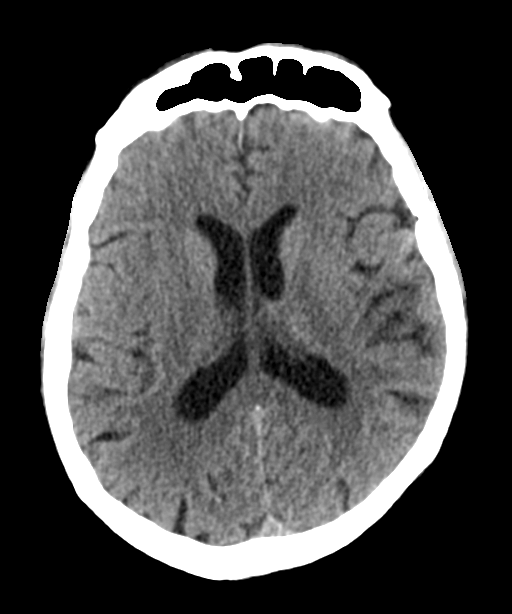
[im 18/33  bone]
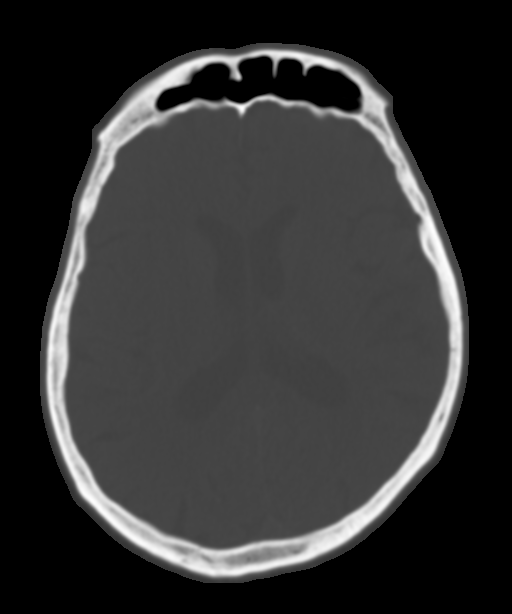
[im 23/33  brain]
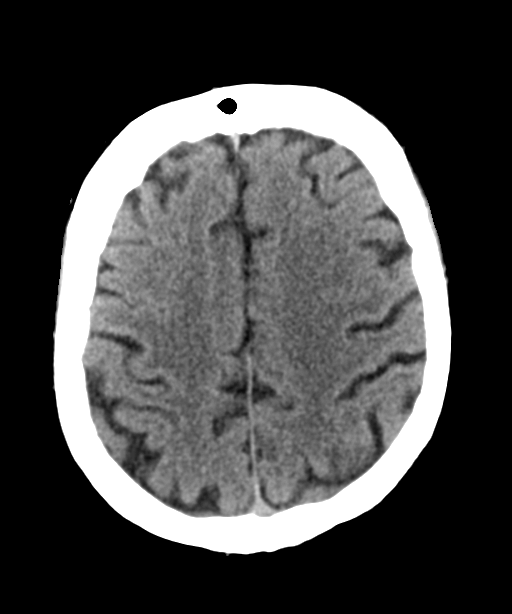
[im 26/33  brain]
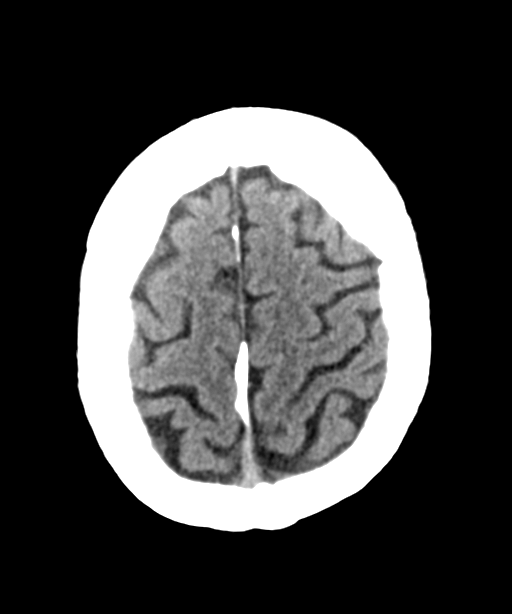
[im 30/33  brain]
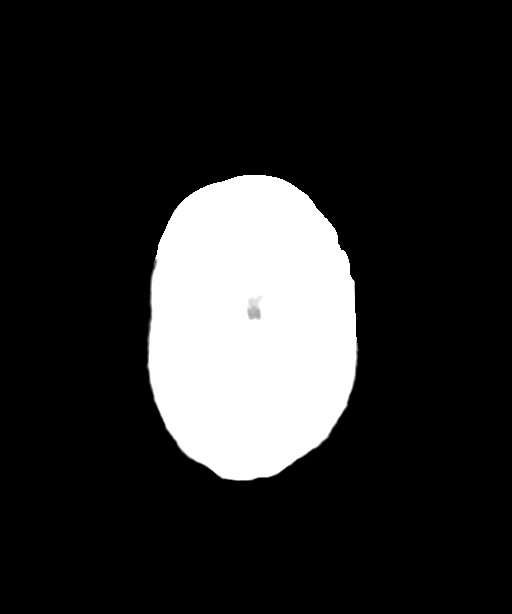

[Series 6: head 3.0 mpr cor · coronal · 0.32mm/px · 3 of 72 slices shown]
[im 24/72  brain]
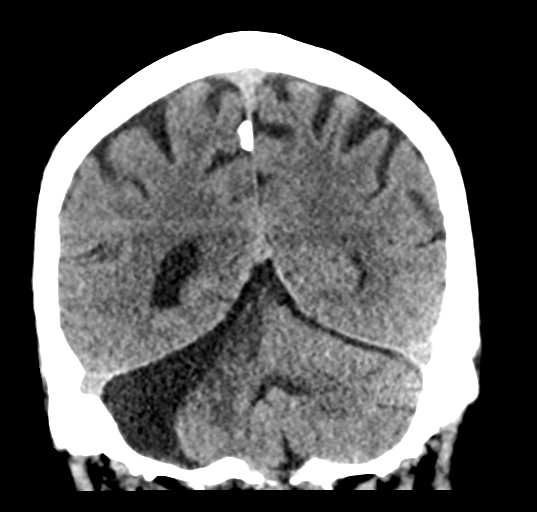
[im 32/72  brain]
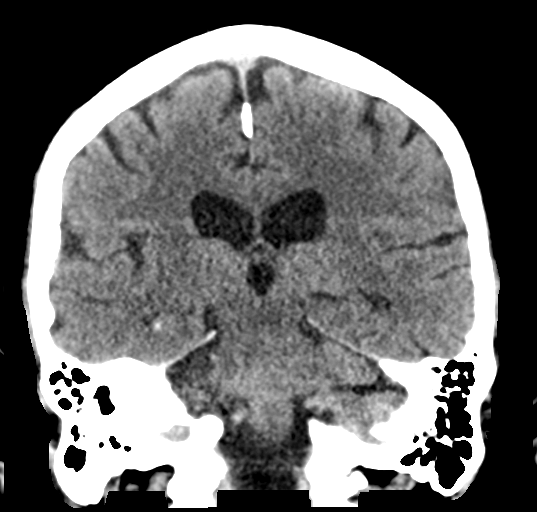
[im 40/72  brain]
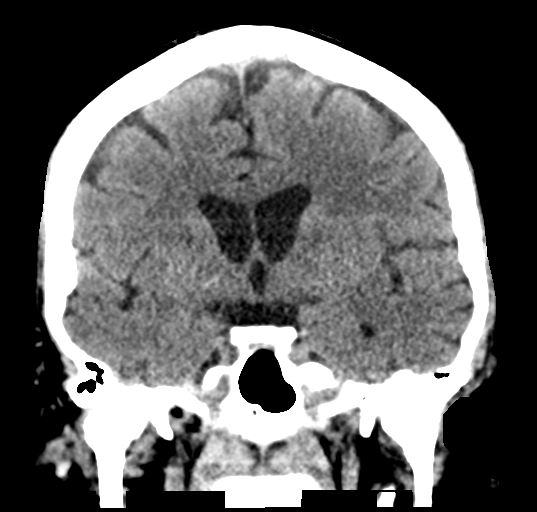

[Series 7: head 3.0 mpr sag · sagittal · 0.32mm/px · 3 of 61 slices shown]
[im 21/61  brain]
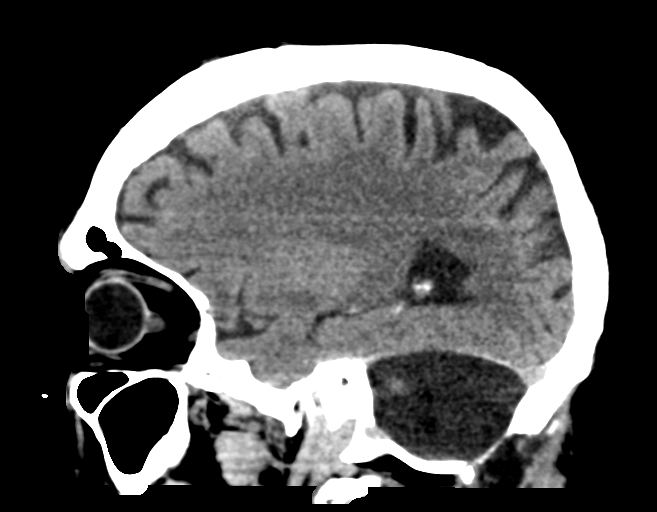
[im 31/61  brain]
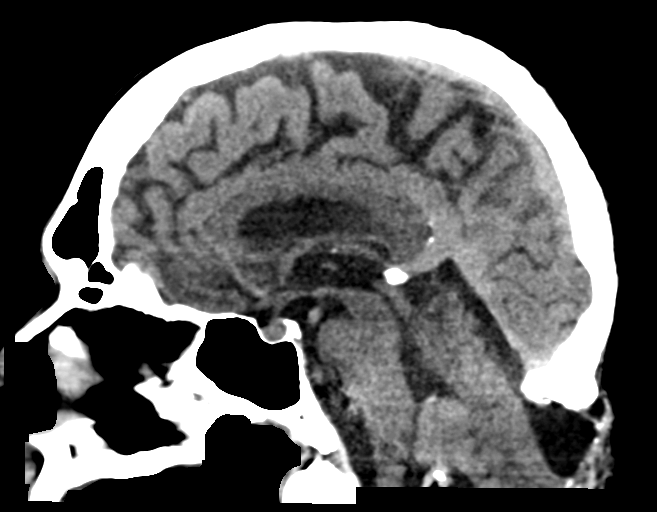
[im 41/61  brain]
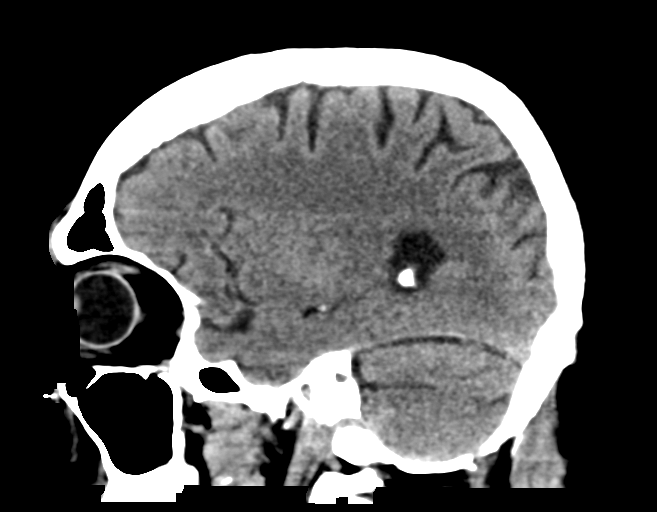

[14 of 47 positions shown; findings below may reference images not displayed]

FINDINGS: Brain: No acute intracranial hemorrhage, midline shift or mass
effect. No hydrocephalus. Craniectomy changes are noted in the
occipital region bilaterally. There is herniation of the cerebellum,
right greater than left, through the craniectomy defect, not
significantly changed from the prior exam. A hypodense region is
seen in the cerebellum in this on the right which appear stable.

Vascular: No hyperdense vessel or unexpected calcification.

Skull: Craniectomy changes are present in the occipital region. No
acute fracture.

Sinuses/Orbits: Mucosal thickening is present in the right sphenoid
sinus and maxillary sinus. The orbits are within normal limits.

Other: None.
IMPRESSION: 1. No acute intracranial hemorrhage.
2. Stable craniectomy changes in the occipital region with
persistent herniation of the cerebellum through the defect.

## 2022-01-02 NOTE — Progress Notes (Signed)
Occupational Therapy Weekly Progress Note  Patient Details  Name: [name] MRN: [id number] Date of Birth: 27-Jan-1946  Beginning of progress report period: 12/26/21 End of progress report period: 01/02/22  Today's Date: 01/02/2022 OT Individual Time: 1030-1130 OT Individual Time Calculation (min): 60 min    Patient has met 3 of 4 short term goals.  [name] has made excellent progress his first week in CIR. He has been more alert overall however still has sessions where lethargy impact his performance. He has been orthostatic recently and that is being managed with teds, an abdominal binder, and medically. He also has had intermittent n/v following mobility that the team is determining BP vs cerebellar associated dizziness. He has recently been decannulated and is tolerating that well. Agitation is often an issue at night and he pulled out his peg tube d/t restlessness, thus still requiring B wrist restraints. He is able to complete UB ADLs with min A and LB with max A. He requires cueing for coordination and motor planning deficits. [name] has a very supportive family that is almost always present. Family education will be scheduled closer to d/c.   Patient continues to demonstrate the following deficits: muscle weakness, decreased cardiorespiratoy endurance, decreased visual acuity and decreased visual perceptual skills, decreased initiation, decreased attention, decreased awareness, decreased problem solving, decreased safety awareness, decreased memory, and delayed processing, and decreased sitting balance, decreased standing balance, decreased postural control, and decreased balance strategies and therefore will continue to benefit from skilled OT intervention to enhance overall performance with BADL and Reduce care partner burden.  Patient progressing toward long term goals..  Continue plan of care.  OT Short Term Goals Week 1:  OT Short Term Goal 1 (Week 1): Pt will attend to ADL task for 1 min  with min cueing OT Short Term Goal 1 - Progress (Week 1): Met OT Short Term Goal 2 (Week 1): Pt will don shirt with min A OT Short Term Goal 2 - Progress (Week 1): Met OT Short Term Goal 3 (Week 1): Pt will complete toilet transfer with LRAD with mod A OT Short Term Goal 3 - Progress (Week 1): Progressing toward goal OT Short Term Goal 4 (Week 1): Pt will complete bimanual grooming tasks with min A OT Short Term Goal 4 - Progress (Week 1): Met Week 2:  OT Short Term Goal 1 (Week 2): Pt will coordinate LB dressing with no more than mod A OT Short Term Goal 2 (Week 2): Pt will demonstrate intellectual awareness with mod cueing OT Short Term Goal 3 (Week 2): Pt will complete toilet transfer consistently with mod A OT Short Term Goal 4 (Week 2): Pt will require no more than mod cueing for full body bathing initiation and sequencing  Skilled Therapeutic Interventions/Progress Updates:    Pt received supine with no c/o pain, confused and perseverative on lines. His son [name] was present throughout session. Some improved awareness re condition and CVA- stating "I have a contusion on my head" and believed he had fallen. He completed bed mobility with min A. Skilled monitoring of vitals throughout session, with inconsistent Spo2 readings and pt reporting mild dizziness upon standing but alleviated quickly with sitting. Pt completed UB ADLs at the sink with mod facilitation required to keep peg tube safe (pt still very restless with hands), but overall only CGA to don shirt and wash. He completed two bouts of 40 ft of functional mobility with mod A overall- R foot clearance slightly worse and he required  mod-max cueing for increasing stride length and midline orientation. Poor awareness of urinary incontinence in brief. Assisted pt in changing at the sink in standing, with max A. He was left sitting up in the TIS w/c with all needs met. Peg running. Continuous pulse ox on and B wrist restraints on. Rob still  present.   Therapy Documentation Precautions:  Precautions Precautions: Fall Precaution Comments: trach, peg, B wrist restraints Restrictions Weight Bearing Restrictions: No   Therapy/Group: Individual Therapy  [name] 01/02/2022, 6:26 AM

## 2022-01-02 NOTE — Progress Notes (Signed)
Upon providing incontinent care this morning noted some small -moderate amount bloody stool from rectum . Incontinent care provided. Patient is agitated, pulling at tubing and restraints,yelling out he is ready to leave and and go home.Made as comfortable as possible. Refused VS at this time. Will continue to monitor closely. Bilateral restraints intact with monitoring per protocol.

## 2022-01-02 NOTE — Progress Notes (Signed)
Physical Therapy Session Note  Patient Details  Name: [name] MRN: [id number] Date of Birth: 1946-11-18  Today's Date: 01/01/2022 PT Individual Time: 1032-1100  PT Individual Time Calculation (min)    Short Term Goals: Week 1:  PT Short Term Goal 1 (Week 1): Patient will perform bed mobility with mod A in a flat bed without rails. PT Short Term Goal 1 - Progress (Week 1): Progressing toward goal PT Short Term Goal 2 (Week 1): Patient will perform basic transfers with mod A consistently. PT Short Term Goal 2 - Progress (Week 1): Progressing toward goal PT Short Term Goal 3 (Week 1): Patient will initiate gait training >10 feet. PT Short Term Goal 3 - Progress (Week 1): Met Week 2:  PT Short Term Goal 1 (Week 2): Pt will perform supine<>sit with mod assist consistently PT Short Term Goal 2 (Week 2): Pt will perform sit<>stand with mod assist of 1 consistently PT Short Term Goal 3 (Week 2): Pt will perform bed<>chair transfers with mod assist of 1 consistently PT Short Term Goal 4 (Week 2): Pt will ambulate at least 105f using LRAD with mod assist of 1 and +2 min assist if needed  Skilled Therapeutic Interventions/Progress Updates:  Patient seated upright in TIS w/c on entrance to room. With wrist restraints in place and visitor present Patient alert and agreeable to PT session but confused as to who therapist is and complete reason for presence despite vocalizing acceptance of physical therapy session.  Patient with no pain complaint throughout session.  Therapeutic Activity: Transfers: Patient performed sit<>stand transfers with MinA for transfer but up to [facility] Groupfor balance throughout. Provided verbal cues for technique throughout.  Neuromuscular Re-ed: NMR facilitated during session with focus on standing balance. Pt guided in sit<>stand performance and static standing balance. Performs x3 with improved performance with each. Pt moves feet during initial standing bout requiring ModA  for balance d/t reduced trunk control. With vc for maintaiin static stance, pt is able to perform sit<>stand with BUE support on therapist's forearms with CGA and balance maintained with CGA/ intermittent MinA. Light pressure from pt into BUE. NMR performed for improvements in motor control and coordination, balance, sequencing, judgement, and self confidence/ efficacy in performing all aspects of mobility at highest level of independence.   Pt's feeding tube becomes dislodged at valve attachment throughout session requiring RN assist for temporarily discontinuing feeding.   Patient seated  in TIS w/c with tilt engaged, BLE elevated, and wrist restraints on at end of session. W/c brakes locked, belt alarm set, and all needs within reach.    Therapy Documentation Precautions:  Precautions Precautions: Fall Precaution Comments: trach, peg, B wrist restraints Restrictions Weight Bearing Restrictions: No General:   Pain:  No pain complaint from pt this day.  Therapy/Group: Individual Therapy  JAlger SimonsPT, DPT 01/01/2022, 6:43 PM

## 2022-01-02 NOTE — Progress Notes (Signed)
Physical therapy team has noted worsening right-sided weakness, right lean and worsening gait impairment as compared to the last several days.  CT of head ordered to rule out acute changes.  Updated son at bedside.

## 2022-01-02 NOTE — Progress Notes (Incomplete)
Patient observed restful at this rime after being provided incontinent care , no agitation of agitative behavior  2005 Informed by CNIona Beard), that CT Scan had called to see if patient was able to come down for xray, medications provided and monitored.,no agitation noted,resting  2040 Transferred to CT with transporter, no agitation/

## 2022-01-02 NOTE — Progress Notes (Signed)
PROGRESS NOTE   Subjective/Complaints:  Appears to have had quieter night. Pt resting when I entered. A little slow to arouse. O2 sats in 90's on room air  ROS: Limited due to cognitive/behavioral    Objective:   DG CHEST PORT 1 VIEW  Result Date: 12/31/2021 CLINICAL DATA:  Hypoxia EXAM: PORTABLE CHEST 1 VIEW COMPARISON:  12/25/2021 FINDINGS: Status post median sternotomy and CABG. Both lungs are clear. Interval removal of tracheostomy appliance. The visualized skeletal structures are unremarkable. IMPRESSION: 1.  No acute abnormality of the lungs in AP portable projection. 2.  Interval removal of tracheostomy appliance. Electronically Signed   By: Delanna Ahmadi M.D.   On: 12/31/2021 15:18   DG Abd Portable 1V  Result Date: 12/31/2021 CLINICAL DATA:  Recent gastrostomy removal EXAM: PORTABLE ABDOMEN - 1 VIEW COMPARISON:  12/12/2021 FINDINGS: Scattered large and small bowel gas is noted. The known Foley replacement catheter in the gastrostomy site is not well visualized due to lack of injected contrast. No free air is seen. No bony abnormality is noted. IMPRESSION: No acute abnormality noted. Electronically Signed   By: Inez Catalina M.D.   On: 12/31/2021 20:41   Recent Labs    01/01/22 0526  WBC 8.3  HGB 14.4  HCT 42.6  PLT 292   Recent Labs    01/01/22 0526  NA 135  K 4.0  CL 98  CO2 27  GLUCOSE 145*  BUN 19  CREATININE 0.83  CALCIUM 8.8*     Intake/Output Summary (Last 24 hours) at 01/02/2022 0842 Last data filed at 01/02/2022 0500 Gross per 24 hour  Intake --  Output 2 ml  Net -2 ml          Physical Exam: Vital Signs Blood pressure 118/60, pulse 82, temperature (!) 97.5 F (36.4 C), temperature source Oral, resp. rate 19, height 5\' 10"  (1.778 m), weight 86.3 kg, SpO2 97 %.   Constitutional: No distress . Vital signs reviewed. HEENT: NCAT, EOMI, oral membranes moist Neck: supple Cardiovascular: RRR  without murmur. No JVD    Respiratory/Chest: CTA Bilaterally without wheezes or rales. Normal effort    GI/Abdomen: BS +, non-tender, non-distended Ext: no clubbing, cyanosis, or edema Psych: slow to arouse this morning but it was early Skin: No evidence of breakdown, no evidence of rash. Surgical scar CDI Neuro:  oriented to self. Follows simple commands with extra time. Moves all 4's at least antigravity. No resting tone. Sensory exam grossly intact Musculoskeletal: cerivcal tenderness. present     Assessment/Plan: 1. Functional deficits which require 3+ hours per day of interdisciplinary therapy in a comprehensive inpatient rehab setting. Physiatrist is providing close team supervision and 24 hour management of active medical problems listed below. Physiatrist and rehab team continue to assess barriers to discharge/monitor patient progress toward functional and medical goals  Care Tool:  Bathing    Body parts bathed by patient: Right arm, Left arm, Chest, Abdomen, Face   Body parts bathed by helper: Front perineal area, Buttocks, Right upper leg, Left upper leg, Right lower leg, Left lower leg     Bathing assist Assist Level: Maximal Assistance - Patient 24 - 49%  Upper Body Dressing/Undressing Upper body dressing   What is the patient wearing?: Pull over shirt    Upper body assist Assist Level: Minimal Assistance - Patient > 75%    Lower Body Dressing/Undressing Lower body dressing      What is the patient wearing?: Pants, Incontinence brief     Lower body assist Assist for lower body dressing: Maximal Assistance - Patient 25 - 49%     Toileting Toileting    Toileting assist Assist for toileting: 2 Helpers     Transfers Chair/bed transfer  Transfers assist     Chair/bed transfer assist level: Moderate Assistance - Patient 50 - 74% (stand pivot)     Locomotion Ambulation   Ambulation assist   Ambulation activity did not occur: Safety/medical  concerns  Assist level: 2 helpers (+2 mod A) Assistive device: Walker-rolling Max distance: 108ft   Walk 10 feet activity   Assist  Walk 10 feet activity did not occur: Safety/medical concerns        Walk 50 feet activity   Assist Walk 50 feet with 2 turns activity did not occur: Safety/medical concerns         Walk 150 feet activity   Assist Walk 150 feet activity did not occur: Safety/medical concerns         Walk 10 feet on uneven surface  activity   Assist Walk 10 feet on uneven surfaces activity did not occur: Safety/medical concerns         Wheelchair     Assist Is the patient using a wheelchair?: Yes Type of Wheelchair: Manual Wheelchair activity did not occur: Safety/medical concerns (limited by cognitive deficits)         Wheelchair 50 feet with 2 turns activity    Assist    Wheelchair 50 feet with 2 turns activity did not occur: Safety/medical concerns       Wheelchair 150 feet activity     Assist  Wheelchair 150 feet activity did not occur: Safety/medical concerns       Blood pressure 118/60, pulse 82, temperature (!) 97.5 F (36.4 C), temperature source Oral, resp. rate 19, height 5\' 10"  (1.778 m), weight 86.3 kg, SpO2 97 %.    Medical Problem List and Plan: 1. 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  Status post posterior decompression with craniectomy 11/26/2021             -patient may not yet shower             -ELOS/Goals: 18-22 days, supervision to min assist goals  -Continue CIR therapies including PT, OT, and SLP  2.  Antithrombotics: -DVT/anticoagulation:  Pharmaceutical: Other (comment) Eliquis             -antiplatelet therapy: N/A 3. Pain Management: Zanaflex 1 mg 3 times daily, Voltaren gel 4 times daily, Fioricet as needed, oxycodone as needed 4. Mood/sleep:   melatonin 5 mg daily as needed              -antipsychotic agents: Zyprexa               -continue sleep chart  1/18 began trial of seroquel last night. Seems to have slept better. Hopefully he will perk up this morning---observe today  -will add sleep chart  -continue low dose klonopin 5. Neuropsych: This patient is not capable of making decisions on his own behalf.  -still requires soft wrist restraints for safety/equipiment--continue 6. Skin/Wound Care:  Routine skin checks 7. Fluids/Electrolytes/Nutrition: Routine in and outs with follow-up chemistries             -continue current TF  -protein supplementation thru peg for low albumin  1/18 continue foley for PEG as long as it continues to function  -change to bolus feeds if no progress on MBS 8.  Prolonged ventilatory support.  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     Decannulated 1/16---occlusive dressing to trach stoma 9.  Dysphagia.  Status post gastrostomy tube 12/13/2021 per Dr.Lovick. -advance per speech therapy,  repeat MBS 1/18 10.  Aspiration pneumonia.  Antibiotic therapy completed.  -afebrile  -aspiration precautions  -hypoxic 1/16, cxr reviewed and unremarkable--sats in 90's now  -can utilize O2 as needed  -maximize time oob 11.  New onset atrial fibrillation.  Started Eliquis 12/17/2021.  Marland Kitchen  Continue low-dose beta-blocker.  1/18 HR controlled Monitor on current meds Vitals:   01/01/22 2244 01/02/22 0730  BP: 126/62 118/60  Pulse: 82   Resp:    Temp:    SpO2: 97%     12.  Hypertension.  Norvasc 10 mg daily, Cozaar 50 mg daily, , clonidine 0.1 mg every 8 hours.     -1/18 bp well controlled 13.  Hyperlipidemia.  Lipitor 14.  History of BPH/urinary retention.       1/17 - -generally has been voiding but is incontinent 15.  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  Follow-up per cardiology services. 16. Blood clots in stool: none reported since 1/16  1/16-blood counts relatively stable        -hgb stable/increased      -pt is  on eliquis     -ua negative, ucx negative   LOS: 8 days A FACE TO FACE EVALUATION WAS PERFORMED  Meredith Staggers 01/02/2022, 8:42 AM

## 2022-01-02 NOTE — TOC Benefit Eligibility Note (Signed)
Patient Teacher, English as a foreign language completed.    The patient is currently admitted and upon discharge could be taking Eliquis 5 mg.  The current 30 day co-pay is, $512.00 due to a $475.00 deductible.  Once deductible is met will be $37.00.   The patient is insured through [employer] Medicare Part D     Lyndel Safe, [place] Patient Advocate Specialist [place] Patient Advocate Team Direct Number: [phone]  Fax: [phone]

## 2022-01-02 NOTE — Progress Notes (Signed)
Physical Therapy Weekly Progress Note  Patient Details  Name: [name] MRN: [id number] Date of Birth: May 20, 1946  Beginning of progress report period: December 26, 2021 End of progress report period: January 02, 2022  Today's Date: 01/02/2022 PT Individual Time: 5956-3875 and [id number]  PT Individual Time Calculation (min): 32 min and 57 min   Patient has met 1 of 3 short term goals. [name] overall has been progressing well with therapy; however, has demonstrated slight decline in the past 2 days. Overall he is performing bed mobility mod assist using bed features, sit<>stands and stand pivot transfers with mod/max assist of 1, and gait training up to 53f with +2 heavy mod assist. 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 He will benefit from continued CIR level therapies to further progress his independence with functional mobility. Plan to D/C home with 24hr support from his very supportive family.   Patient continues to demonstrate the following deficits muscle weakness and muscle joint tightness, decreased cardiorespiratoy endurance and decreased oxygen support, impaired timing and sequencing, unbalanced muscle activation, motor apraxia, ataxia, decreased coordination, and decreased motor planning, decreased midline orientation and decreased motor planning, decreased initiation, decreased attention, decreased awareness, decreased problem solving, decreased safety awareness, decreased memory, and delayed processing, central origin, and decreased sitting balance, decreased standing balance, decreased postural control, hemiplegia, and decreased balance strategies and therefore will continue to benefit from skilled PT intervention to increase functional independence with mobility.  Patient progressing toward long term goals..  Continue plan of care.  PT  Short Term Goals Week 1:  PT Short Term Goal 1 (Week 1): Patient will perform bed mobility with mod A in a flat bed without rails. PT Short Term Goal 1 - Progress (Week 1): Progressing toward goal PT Short Term Goal 2 (Week 1): Patient will perform basic transfers with mod A consistently. PT Short Term Goal 2 - Progress (Week 1): Progressing toward goal PT Short Term Goal 3 (Week 1): Patient will initiate gait training >10 feet. PT Short Term Goal 3 - Progress (Week 1): Met Week 2:  PT Short Term Goal 1 (Week 2): Pt will perform supine<>sit with mod assist consistently PT Short Term Goal 2 (Week 2): Pt will perform sit<>stand with mod assist of 1 consistently PT Short Term Goal 3 (Week 2): Pt will perform bed<>chair transfers with mod assist of 1 consistently PT Short Term Goal 4 (Week 2): Pt will ambulate at least 260fusing LRAD with mod assist of 1 and +2 min assist if needed  Skilled Therapeutic Interventions/Progress Updates:  Ambulation/gait training;Cognitive remediation/compensation;Discharge planning;DME/adaptive equipment instruction;Functional mobility training;Pain management;Psychosocial support;Splinting/orthotics;Therapeutic Activities;UE/LE Strength taining/ROM;Visual/perceptual remediation/compensation;Wheelchair propulsion/positioning;UE/LE Coordination activities;Therapeutic Exercise;Stair training;Skin care/wound management;Patient/family education;Neuromuscular re-education;Functional electrical stimulation;Disease management/prevention;Community reintegration;Balance/vestibular training   Session 1: Pt received asleep, supine in bed and requiring increased time to awaken fully and maintain eyes open. Pt agreeable to therapy session. Pt wearing B UE soft wrist restraints - doffed during session. Pt already wearing abdominal binder. Pt noted to be incontinent of bladder. Rolling L with hand-over-hand assist to reach across his body with R UE and locate bedrail to assist with  rolling, min assist. Total assist brief management and peri-care. Donned B LE TED hose. Supine>sitting L EOB, HOB partially elevated and using bedrail, with max assist for trunk upright and balance. Scooting towards EOB with heavy mod assist to prevent posterior trunk LOB. Sitting EOB with +2 assist  for trunk control due to consistent posterior lean while therapist threaded on pants and donned tennis shoes total assist - attempted to have pt participate in task but some difficulty attending to and following cues. Sit>stand from EOB to B HHA with +2 mod assist for rising and balance - total assist to pull pants up over hips. Sit>supine with heavy mod assist for trunk descent and B LE management into bed. Pt left supine in bed, HOB elevated >30 degrees, B soft wrist restraints donned, bed alarm on, needs in reach, and lines intact.  Vitals: Supine: BP 121/78 (MAP 92), HR 97bpm  Sitting EOB: BP 130/70 (MAP 80), HR 69bpm  Standing: BP 148/132 (MAP 139), HR 112bpm - unsure if this was accurate therefore returned to sitting EOB and reassessed: BP 162/146 (MAP 154), HR 76bpm  Transitioned to supine and assessed again: BP 140/77 (MAP 92), HR 90bpm   Of note: Throughout session pt demos increased confusion - when therapist asked patient for his wife's name he gave therapist what appears to be his phone number. Therapist unsure if this was due to lethargy and having just awoken therefore consulted SLP with therapist plan to reassess in afternoon PT session.    Session 2: Pt received supine in bed with his son, [name], present. Pt with some fatigue but agreeable to therapy session. Pt wearing B UE soft wrist restraints - once these removed pt with increased restlessness pulling at pulse oximeter on L hand and fidgeting with clothing requiring max cuing and hand-over-hand facilitation to redirect attention. Pt already wearing abdominal binder and TED hose - nurse notified and present to disconnect tube feedings for  session. Supine>sitting L EOB, HOB partially elevated and bedrails available, with max assist of 1 for trunk upright - pt having poor R hand attention (not reaching across to grab bed rail and when thearpist provided hand-over-hand cuing to perform this, pt was having a hard time directing attention to this cue and just pulled on therapist's hand). Scooting towards EOB with heavy mod assist for trunk control due to posterior lean/LOB. Sit>stand from EOB to B UE support on therapist's shoulders - requires heavy mod assist to lift into standing and then max assist for balance (+2 assist CGA for safety) while performing R stand pivot to w/c due to stronger R lean and poorer R lateral stepping, therapist facilitating weight shift onto stance limb.  Transported to/from gym in w/c for time management and energy conservation.   Attempted sit>stand TIS w/c>RW as pt has been utilizing RW for gait training - pt not able to understand when therapist cued him to "stand up" and that he is "going to walk" - pt starts making car noises like he is racing and not standing. Removed RW and transitioned to B HHA to allow facilitation to initiate coming to stand - +2 mod assist for rising to stand.   Gait training ~50ft using B HHA with heavy +2 mod/max assist for balance and RLE management - pt demos worsening R trunk lean and more forward flexed trunk posturing as well as poorer R foot clearance and advancement in swing with continued R LE scissoring - requires max assist to advance and reposition R LE. Pt requiring seated rest break and appears fatigued from this gait distance.  Gait training ~25ft using RW to assess and compare to pt's gait this past Saturday (1/14) with this therapist - at this time pt requires heavy +2 mod assist for balance, AD management, and R LE management -  pt continues to have strong R lean, inability to clear R foot and advance even to achieve step-to pattern as well as poor AD management requiring  heavy assistance to turn. Again, pt requesting seated rest break and appears visibly fatigued.   Gait training ~95f using B HHA again with heavy +2 mod/max assist and at this time pt continuing to have above impairments as well as starting to demo poor R knee extension in stance (not buckling, but moving towards crouched posture) requiring therapist facilitation for improvement.  Transported pt back to room in w/c. Therapist discussed pt's presentation with pt's son and pt's son also reports noticing pt's R foot drag more during gait in therapy as well as overall more cognitive impairments.   L squat pivot w/c>EOB with +2 mod/max assist for balance and pivoting his hips. Sit>supine with mod assist for B LE management into bed as pt initially lying on his L side. Pt left supine in bed, HOB elevated >30 degrees, soft wrist restraints donned, needs in reach, lines intact, and pt's son present.  Vitals:  Sitting in w/c in gym at beginning: BP 113/80 (MAP 89), HR 99bpm  After 1st walk: BP 105/74 (MAP 84), HR 97bpm  After 2nd walk: BP 110/77 (MAP 89)   Of note: Throughout session, pt with increased confusion and confabulation as well as increased restlessness and more impaired ability to attend to and follow commands for mobility tasks. Pt also demonstrates stronger R lean with more impaired R LE gait mechanics. SKatharine Look PUtahon call notified.   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  Session 2: No reports of pain throughout session.   Therapy/Group: Individual Therapy  CTawana Scale, PT, DPT, NCS, CSRS  01/02/2022, 7:46 AM

## 2022-01-02 NOTE — Progress Notes (Signed)
Modified Barium Swallow Progress Note  Patient Details  Name: Torrin Crihfield MRN: [id number] Date of Birth: 1946/07/06  Today's Date: 01/02/2022  Modified Barium Swallow completed.  Full report located under Chart Review in the Imaging Section.  Brief recommendations include the following:  Clinical Impression  Patient demonstrates a moderate pharyngeal dysphagia. Patient demonstrates poor pharyngeal constriction and pharyngeal clearing of bolus resulting in moderate vallecular and pyriform sinus residue, especially with thicker boluses.  Due to patients cognitive deficits, patient demonstrated decreased ability to trial positional changes or compensatory strategies.  The residue from the pyriform sinuses eventually spilled over into his airway and were aspirated posteriorly. Patient with consistent sensation. However, patient with one episode of aspiration anteriorly which was not sensed.  Patient was reclined to a 35-degree angle to allow gravity to assist with pharyngeal clearance with reduced pyriform sinus residue noted with thin boluses.  This angle also ensures that the if the patient were to aspirate, the aspiration events would happen posteriorly which patient consistently sensed throughout the study. Recommend patient remain NPO with trials of thin liquids with SLP in a reclined position.   Swallow Evaluation Recommendations       SLP Diet Recommendations: NPO       Medication Administration: Via alternative means               Oral Care Recommendations: Oral care QID        Perian Tedder 01/02/2022,3:23 PM

## 2022-01-02 NOTE — Progress Notes (Signed)
Speech Language Pathology Weekly Progress and Session Note  Patient Details  Name: [name] MRN: [id number] Date of Birth: Sep 18, 1946  Beginning of progress report period: December 26, 2021 End of progress report period: January 02, 2022  Today's Date: 01/02/2022 SLP Individual Time: 1330-1400 SLP Individual Time Calculation (min): 30 min  Short Term Goals: Week 1: SLP Short Term Goal 1 (Week 1): Patient will sustain attention to functional tasks for 5 minutes with Mod verbal cues for redirection. SLP Short Term Goal 1 - Progress (Week 1): Not met SLP Short Term Goal 2 (Week 1): Patient will initiate functional tasks in 50% of opportunities with Mod verbal cues. SLP Short Term Goal 2 - Progress (Week 1): Met SLP Short Term Goal 3 (Week 1): Patient will verbalize wants/needs at the phrase level in 50% of opportunities with Mod verbal cues. SLP Short Term Goal 3 - Progress (Week 1): Met SLP Short Term Goal 4 (Week 1): Patient will demonstrate orientaton to place, time and situation with Mod verbal and visual cues. SLP Short Term Goal 4 - Progress (Week 1): Not met SLP Short Term Goal 5 (Week 1): Patient will consume trials of ice chips with minimal overt s/s of aspiration and Min verbal cues for attention to bolus over 2 sessions to assess readiness for repeat MBS. SLP Short Term Goal 5 - Progress (Week 1): Met    New Short Term Goals: Week 2: SLP Short Term Goal 1 (Week 2): Patient will sustain attention to functional tasks for 3 minutes with Mod verbal cues for redirection. SLP Short Term Goal 2 (Week 2): Patient will demonstrate orientaton to place, time and situation with Mod verbal and visual cues. SLP Short Term Goal 3 (Week 2): Patient will consume trials of thin liquids without overt s/s of aspiration in 75% of trials and Mod verbal cues for use of swallowing compensatory strategies. SLP Short Term Goal 4 (Week 2): Patient will utilize a second swallow after trials of liquids in  50% of opportunities with Max verbal cues.  Weekly Progress Updates: Patient has made inconsistent gains and has met 3 of 5 STGs this reporting period. Currently, patient continues to demonstrate moderate confusion with intermittent agitation. Patient with language of confusion and requires Max-Total A for orientation and sustained attention to tasks. However, patient has made improvements in arousal and initiation of tasks. Patient is now decannulated and is ~90% intelligible at the sentence level. Patient had a repeat MBS today and continues to demonstrate a moderate pharyngeal dysphagia with recommendations to remain NPO. Patient and family education ongoing. Patient would benefit from continued skilled SLP intervention to maximize his cognitive-linguistic and swallowing function prior to discharge.      Intensity: Minumum of 1-2 x/day, 30 to 90 minutes Frequency: 3 to 5 out of 7 days Duration/Length of Stay: 01/23/22 Treatment/Interventions: Cognitive remediation/compensation;Internal/external aids;Speech/Language facilitation;Therapeutic Activities;Environmental controls;Cueing hierarchy;Functional tasks;Patient/family education;Dysphagia/aspiration precaution training   Daily Session  Skilled Therapeutic Interventions: Skilled treatment session focused on education and cognitive goals. Upon arrival, Patient was asleep in the tilt-in-space wheelchair. SLP provided education to the patient's son regarding current swallowing function, recommendations and plan of care with use of video from the MBS. He verbalized understanding. SLP provided verbal and tactile cues for patient to rouse from sleep. Patient quickly became restless in his wheelchair and reported he needed to have a bowel movement. Patient became verbally agitated as it took extra time to get equipment in right place and disconnect patient from the numerous amount of  lines. Patient required Max verbal cues for safety with transfer via the  [place] due to impulsivity and agitation. Of note, patient appeared more ataxic while reaching for the [facility] with a worsening right lean observed. Patient unable to void as he was confused utilizing his BSC vs the commode. Patient transferred back to bed at end of session to decrease stimulation. Patient left restrained in bed with alarm on and son present. Continue with current plan of care.    Pain No/Denies Pain   Therapy/Group: Individual Therapy  Davette Nugent 01/02/2022, 6:14 AM

## 2022-01-03 LAB — GLUCOSE, CAPILLARY
Glucose-Capillary: 106 mg/dL — ABNORMAL HIGH (ref 70–99)
Glucose-Capillary: 127 mg/dL — ABNORMAL HIGH (ref 70–99)
Glucose-Capillary: 136 mg/dL — ABNORMAL HIGH (ref 70–99)
Glucose-Capillary: 81 mg/dL (ref 70–99)
Glucose-Capillary: 99 mg/dL (ref 70–99)

## 2022-01-03 MED ORDER — OSMOLITE 1.5 CAL PO LIQD
356.0000 mL | Freq: Four times a day (QID) | ORAL | Status: DC
  Administered 2022-01-03 – 2022-01-09 (×23): 356 mL
  Filled 2022-01-03 (×3): qty 474

## 2022-01-03 MED ORDER — FREE WATER
250.0000 mL | Freq: Four times a day (QID) | Status: DC
  Administered 2022-01-03 – 2022-01-10 (×27): 250 mL

## 2022-01-03 MED ORDER — PROSOURCE TF PO LIQD
45.0000 mL | Freq: Two times a day (BID) | ORAL | Status: DC
  Administered 2022-01-03 – 2022-01-24 (×42): 45 mL
  Filled 2022-01-03 (×41): qty 45

## 2022-01-03 NOTE — Progress Notes (Signed)
Speech Language Pathology Daily Session Note  Patient Details  Name: [name] MRN: [id number] Date of Birth: 05-Apr-1946  Today's Date: 01/03/2022 SLP Individual Time: 1400-1445 SLP Individual Time Calculation (min): 45 min  Short Term Goals: Week 2: SLP Short Term Goal 1 (Week 2): Patient will sustain attention to functional tasks for 3 minutes with Mod verbal cues for redirection. SLP Short Term Goal 2 (Week 2): Patient will demonstrate orientaton to place, time and situation with Mod verbal and visual cues. SLP Short Term Goal 3 (Week 2): Patient will consume trials of thin liquids without overt s/s of aspiration in 75% of trials and Mod verbal cues for use of swallowing compensatory strategies. SLP Short Term Goal 4 (Week 2): Patient will utilize a second swallow after trials of liquids in 50% of opportunities with Max verbal cues.  Skilled Therapeutic Interventions: Pt seen for skilled ST with focus on swallowing and cognitive communication goals, sister present throughout. Pt in bed with B soft wrist restraints presents, pleasant and verbose during session. With cues and assist from sister, pt participating in simple orientation activity benefiting from max A multimodal cues. Pt able to sustain attention during this task for ~10 minutes with mod verbal cues. SLP completing oral care with patient and HOB placed at 35 degrees for thin liquid trials per MBS recommendations. Pt consuming ~3oz thin liquid via straw with overt s/s aspiration on 50% of trials (immediate coughing). Pt cognition impacting ability to understand and consistently follow swallow precautions, often talking before, during and after swallow despite cues. Pt able to complete secondary swallow with trials 40% of the time with max A verbal cues. Pt left in bed with B soft wrist restraints in place and sister present for needs. Cont ST POC.   Pain Pain Assessment Pain Scale: 0-10 Pain Score: 0-No pain Faces Pain Scale: No  hurt PAINAD (Pain Assessment in Advanced Dementia) Breathing: normal Negative Vocalization: none Facial Expression: smiling or inexpressive Body Language: relaxed Consolability: no need to console PAINAD Score: 0 Critical Care Pain Observation Tool (CPOT) Facial Expression: Relaxed, neutral Body Movements: Absence of movements Muscle Tension: Relaxed  Therapy/Group: Individual Therapy  Dewaine Conger 01/03/2022, 2:38 PM

## 2022-01-03 NOTE — Plan of Care (Signed)
°  Problem: Safety: Goal: Non-violent Restraint(s) Outcome: Progressing   Problem: Consults Goal: RH STROKE PATIENT EDUCATION Description: See Patient Education module for education specifics  Outcome: Progressing Goal: Nutrition Consult-if indicated Outcome: Progressing   Problem: RH BOWEL ELIMINATION Goal: RH STG MANAGE BOWEL WITH ASSISTANCE Description: STG Manage Bowel with [place]. Outcome: Progressing Goal: RH STG MANAGE BOWEL W/MEDICATION W/ASSISTANCE Description: STG Manage Bowel with Medication with [place]. Outcome: Progressing   Problem: RH BLADDER ELIMINATION Goal: RH STG MANAGE BLADDER WITH ASSISTANCE Description: STG Manage Bladder With Min Assistance Outcome: Progressing Goal: RH STG MANAGE BLADDER WITH MEDICATION WITH ASSISTANCE Description: STG Manage Bladder With Medication With [place]. Outcome: Progressing   Problem: RH SKIN INTEGRITY Goal: RH STG MAINTAIN SKIN INTEGRITY WITH ASSISTANCE Description: STG Maintain Skin Integrity With [place]. Outcome: Progressing Goal: RH STG ABLE TO PERFORM INCISION/WOUND CARE W/ASSISTANCE Description: STG Able To Perform Incision/Wound Care With [employer]. Outcome: Progressing   Problem: RH SAFETY Goal: RH STG ADHERE TO SAFETY PRECAUTIONS W/ASSISTANCE/DEVICE Description: STG Adhere to Safety Precautions With Cues and Reminders. Outcome: Progressing Goal: RH STG DECREASED RISK OF FALL WITH ASSISTANCE Description: STG Decreased Risk of Fall With [employer]. Outcome: Progressing   Problem: RH COGNITION-NURSING Goal: RH STG USES MEMORY AIDS/STRATEGIES W/ASSIST TO PROBLEM SOLVE Description: STG Uses Memory Aids/Strategies With Min Assistance to Problem Solve. Outcome: Progressing Goal: RH STG ANTICIPATES NEEDS/CALLS FOR ASSIST W/ASSIST/CUES Description: STG Anticipates Needs/Calls for Assist With Min Assistance/Cues. Outcome: Progressing   Problem: RH KNOWLEDGE DEFICIT Goal: RH STG  INCREASE KNOWLEDGE OF DYSPHAGIA/FLUID INTAKE Description: Patient and family with demonstrate knowledge of dysphagia diets and thickened liquids with educational materials and handouts provided by staff independently at discharge. Outcome: Progressing Goal: RH STG INCREASE KNOWLEDGE OF STROKE PROPHYLAXIS Description: Patient will demonstrate knowledge of medications used to prevent future strokes with educational materials and handouts provided by staff independently at discharge. Outcome: Progressing   Problem: Education: Goal: Knowledge about tracheostomy care/management will improve Outcome: Progressing   Problem: Activity: Goal: Ability to tolerate increased activity will improve Description: With min assist. Outcome: Progressing   Problem: Respiratory: Goal: Patent airway maintenance will improve Description: With min assist. Outcome: Progressing   Problem: Role Relationship: Goal: Ability to communicate will improve Description: With min assist. Outcome: Progressing   Problem: Safety: Goal: Violent Restraint(s) Outcome: Progressing

## 2022-01-03 NOTE — Progress Notes (Signed)
Occupational Therapy Session Note  Patient Details  Name: [name] MRN: [id number] Date of Birth: 05/09/1946  Today's Date: 01/03/2022 OT Individual Time: 1035-1130 OT Individual Time Calculation (min): 55 min    Short Term Goals: Week 2:  OT Short Term Goal 1 (Week 2): Pt will coordinate LB dressing with no more than mod A OT Short Term Goal 2 (Week 2): Pt will demonstrate intellectual awareness with mod cueing OT Short Term Goal 3 (Week 2): Pt will complete toilet transfer consistently with mod A OT Short Term Goal 4 (Week 2): Pt will require no more than mod cueing for full body bathing initiation and sequencing  Skilled Therapeutic Interventions/Progress Updates:    Pt received sitting in the TIS with no c/o pain. All vitals stable. He was more alert/less confused than yesterday. Ambulatory transfer to the bathroom with the RW, with mod A overall- poor R foot clearance again requiring max cueing for management. Mod A for RW management during turn. Max A overall for toileting tasks. Pt incontinent in brief and then voided more urine in the toilet. He c/o dizziness on the toilet and BP was assessed- 84/74. Following a rest it rose to 93/72 and pt was able to accurately report dizziness level. For the remainder of session used a scale to assess dizziness based off pt symptomology since it proved it be relatively consistent indicator of hemodynamic stability. He returned to the w/c with mod A via 10 ft of functional mobility. Pt was brought to the therapy gym via TIS w/c. He completed a functional reaching activity to work on Secretary/administrator and [place] coordination. Difficulty with fine motor coordination and undershooting to target. Catching/throwing task dynamically with great difficulty with fast bimanual coordination. Pt was left sitting up with all needs met, chair alarm set. B wrist restraints on.   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

## 2022-01-03 NOTE — Progress Notes (Addendum)
Nutrition Follow-up  DOCUMENTATION CODES:   Not applicable  INTERVENTION:  Stop continuous tube feeds.  At 2000, initiate bolus tube feeds using Osmolite 1.5 cal formula via PEG at starting volume of 120 ml (half carton/ARC) and advance by 120 ml at each feeding until goal volume of 356 ml (1.5 cartons/ARCs) given QID is met.   Provide 45 ml Prosource TF BID per tube.   Provide free water flushes of 250 ml q 6 hours per tube.   Tube feeding regimen at goal to provide 2216 kcal, 111 grams protein, 2082 ml free water.  NUTRITION DIAGNOSIS:   Inadequate oral intake related to inability to eat as evidenced by NPO status; ongoing  GOAL:   Patient will meet greater than or equal to 90% of their needs; met with TF  MONITOR:   Labs, Weight trends, TF tolerance, Skin, I & O's  REASON FOR ASSESSMENT:   Consult Enteral/tube feeding initiation and management  ASSESSMENT:   76 year old male with history of CAD status post CABG x4 2012, hyperlipidemia, nephrolithiasis, BPH/prostate cancer. Presented 11/25/2021 with altered mental status as well as dizziness with ataxic gait and diaphoretic. 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 Pt underwent suboccipital decompressive craniectomy 11/26/2021. Pt intubated for prolonged time underwent tracheostomy 12/10/2021 and PEG tube placement 12/13/2021. Pt with decreased functional mobility and admitted to CIR. Trach decannulated 1/16.  Pt underwent MBS yesterday. Pt with moderate pharyngeal dysphagia and decreased cognition behaviors. Pt continues on NPO status with continuation of tube feeds for nutrition. Pt has been tolerating his continuous feeds via PEG with no difficulties. RD consulted to transition to bolus feeds. RD to modify and put in bolus feed orders. Labs and medications reviewed.   Diet Order:   Diet Order             Diet NPO time specified  Diet effective midnight                   EDUCATION NEEDS:    Not appropriate for education at this time  Skin:  Skin Assessment: Reviewed RN Assessment Skin Integrity Issues:: Incisions Incisions: head  Last BM:  1/18  Height:   Ht Readings from Last 1 Encounters:  12/25/21 5' 10" (1.778 m)    Weight:   Wt Readings from Last 1 Encounters:  01/02/22 86.3 kg   BMI:  Body mass index is 27.3 kg/m.  Estimated Nutritional Needs:   Kcal:  2100-2300  Protein:  115-125 grams  Fluid:  >/= 2 L/day  Corrin Parker, MS, RD, LDN RD pager number/after hours weekend pager number on Am[employer].

## 2022-01-03 NOTE — Progress Notes (Addendum)
PROGRESS NOTE   Subjective/Complaints: No issues reported overnight. Therapy noticed worsening gait/right HP--HCT ordered and without acute abnl. Pt lying in bed when I arrived this morning. Pt's MBS results reviewed.   ROS: Limited due to cognitive/behavioral    Objective:   CT HEAD WO CONTRAST (5MM)  Result Date: 01/02/2022 CLINICAL DATA:  Postop craniotomy, worsening right-sided weakness. History of cerebellar bleed. EXAM: CT HEAD WITHOUT CONTRAST TECHNIQUE: Contiguous axial images were obtained from the base of the skull through the vertex without intravenous contrast. RADIATION DOSE REDUCTION: This exam was performed according to the departmental dose-optimization program which includes automated exposure control, adjustment of the mA and/or kV according to patient size and/or use of iterative reconstruction technique. COMPARISON:  12/14/2021. FINDINGS: Brain: No acute intracranial hemorrhage, midline shift or mass effect. No hydrocephalus. Craniectomy changes are noted in the occipital region bilaterally. There is herniation of the cerebellum, right greater than left, through the craniectomy defect, not significantly changed from the prior exam. A hypodense region is seen in the cerebellum in this on the right which appear stable. Vascular: No hyperdense vessel or unexpected calcification. Skull: Craniectomy changes are present in the occipital region. No acute fracture. Sinuses/Orbits: Mucosal thickening is present in the right sphenoid sinus and maxillary sinus. The orbits are within normal limits. Other: None. IMPRESSION: 1. No acute intracranial hemorrhage. 2. Stable craniectomy changes in the occipital region with persistent herniation of the cerebellum through the defect. Electronically Signed   By: Brett Fairy M.D.   On: 01/02/2022 21:12   DG Swallowing Func-Speech Pathology  Result Date: 01/02/2022 Table formatting from the  original result was not included. Objective Swallowing Evaluation: Type of Study: MBS-Modified Barium Swallow Study  Patient Details Name: [name] MRN: [id number] Date of Birth: 05-05-1946 Today's Date: 01/02/2022 Past Medical History: Past Medical History: Diagnosis Date  CAD (coronary artery disease)   a. s/p CABG in 2012  Hyperlipidemia  Past Surgical History: Past Surgical History: Procedure Laterality Date  CORONARY ARTERY BYPASS GRAFT  2012  ESOPHAGOGASTRODUODENOSCOPY N/A 12/13/2021  Procedure: ESOPHAGOGASTRODUODENOSCOPY (EGD);  Surgeon: Jesusita Oka, MD;  Location: [facility] ENDOSCOPY;  Service: General;  Laterality: N/A;  PEG PLACEMENT N/A 12/13/2021  Procedure: PERCUTANEOUS ENDOSCOPIC GASTROSTOMY (PEG) PLACEMENT;  Surgeon: Jesusita Oka, MD;  Location: [place] ENDOSCOPY;  Service: General;  Laterality: N/A;  SUBOCCIPITAL CRANIECTOMY CERVICAL LAMINECTOMY N/A 11/26/2021  Procedure: SUBOCCIPITAL CRANIECTOMY;  Surgeon: Judith Part, MD;  Location: [facility];  Service: Neurosurgery;  Laterality: N/A; HPI: See H&P  Subjective: alert  Recommendations for follow up therapy are one component of a multi-disciplinary discharge planning process, led by the attending physician.  Recommendations may be updated based on patient status, additional functional criteria and insurance authorization. Assessment / Plan / Recommendation Clinical Impressions 01/02/2022 Clinical Impression Patient demonstrates a moderate pharyngeal dysphagia. Patient demonstrates poor pharyngeal constriction and pharyngeal clearing of bolus resulting in moderate vallecular and pyriform sinus residue, especially with thicker boluses.  Due to patients cognitive deficits, patient demonstrated decreased ability to trial positional changes or compensatory strategies.  The residue from the pyriform sinuses eventually spilled over into his airway and were aspirated posteriorly. Patient with consistent sensation. However, patient with  one episode of  aspiration anteriorly which was not sensed.  Patient was reclined to a 35-degree angle to allow gravity to assist with pharyngeal clearance with reduced pyriform sinus residue noted with thin boluses.  This angle also ensures that the if the patient were to aspirate, the aspiration events would happen posteriorly which patient consistently sensed throughout the study. Recommend patient remain NPO with trials of thin liquids with SLP in a reclined position. SLP Visit Diagnosis Dysphagia, pharyngeal phase (R13.13) Attention and concentration deficit following -- Frontal lobe and executive function deficit following -- Impact on safety and function Moderate aspiration risk   Treatment Recommendations 11/30/2021 Treatment Recommendations Therapy as outlined in treatment plan below   Prognosis 01/02/2022 Prognosis for Safe Diet Advancement Good Barriers to Reach Goals Cognitive deficits;Behavior;Severity of deficits Barriers/Prognosis Comment -- Diet Recommendations 01/02/2022 SLP Diet Recommendations NPO Liquid Administration via -- Medication Administration Via alternative means Compensations -- Postural Changes --   Other Recommendations 01/02/2022 Recommended Consults -- Oral Care Recommendations Oral care QID Other Recommendations -- Follow Up Recommendations -- Assistance recommended at discharge -- Functional Status Assessment -- Frequency and Duration  01/02/2022 Speech Therapy Frequency (ACUTE ONLY) min 3x week Treatment Duration 3 weeks   Oral Phase 01/02/2022 Oral Phase WFL Oral - Pudding Teaspoon -- Oral - Pudding Cup -- Oral - Honey Teaspoon -- Oral - Honey Cup -- Oral - Nectar Teaspoon -- Oral - Nectar Cup -- Oral - Nectar Straw -- Oral - Thin Teaspoon -- Oral - Thin Cup -- Oral - Thin Straw -- Oral - Puree -- Oral - Mech Soft -- Oral - Regular -- Oral - Multi-Consistency -- Oral - Pill -- Oral Phase - Comment --  Pharyngeal Phase 01/02/2022 Pharyngeal Phase Impaired Pharyngeal- Pudding Teaspoon -- Pharyngeal --  Pharyngeal- Pudding Cup -- Pharyngeal -- Pharyngeal- Honey Teaspoon -- Pharyngeal -- Pharyngeal- Honey Cup -- Pharyngeal -- Pharyngeal- Nectar Teaspoon Delayed swallow initiation-vallecula;Reduced anterior laryngeal mobility;Pharyngeal residue - valleculae;Pharyngeal residue - pyriform;Reduced pharyngeal peristalsis Pharyngeal -- Pharyngeal- Nectar Cup Reduced pharyngeal peristalsis;Reduced anterior laryngeal mobility;Pharyngeal residue - valleculae;Pharyngeal residue - pyriform Pharyngeal Material does not enter airway Pharyngeal- Nectar Straw Reduced pharyngeal peristalsis;Reduced anterior laryngeal mobility;Pharyngeal residue - valleculae;Pharyngeal residue - pyriform;Penetration/Apiration after swallow Pharyngeal Material enters airway, passes BELOW cords and not ejected out despite cough attempt by patient Pharyngeal- Thin Teaspoon Reduced pharyngeal peristalsis;Reduced anterior laryngeal mobility;Penetration/Aspiration during swallow Pharyngeal Material enters airway, remains ABOVE vocal cords and not ejected out Pharyngeal- Thin Cup Reduced pharyngeal peristalsis;Reduced anterior laryngeal mobility;Penetration/Apiration after swallow;Penetration/Aspiration during swallow Pharyngeal Material enters airway, passes BELOW cords and not ejected out despite cough attempt by patient Pharyngeal- Thin Straw Penetration/Aspiration during swallow;Penetration/Apiration after swallow;Reduced anterior laryngeal mobility;Reduced pharyngeal peristalsis;Pharyngeal residue - valleculae;Pharyngeal residue - pyriform Pharyngeal Material enters airway, passes BELOW cords and not ejected out despite cough attempt by patient;Material enters airway, passes BELOW cords without attempt by patient to eject out (silent aspiration) Pharyngeal- Puree Reduced pharyngeal peristalsis;Reduced anterior laryngeal mobility;Pharyngeal residue - valleculae Pharyngeal Material does not enter airway Pharyngeal- Mechanical Soft -- Pharyngeal --  Pharyngeal- Regular -- Pharyngeal -- Pharyngeal- Multi-consistency -- Pharyngeal -- Pharyngeal- Pill -- Pharyngeal -- Pharyngeal Comment --  Cervical Esophageal Phase  01/02/2022 Cervical Esophageal Phase WFL Pudding Teaspoon -- Pudding Cup -- Honey Teaspoon -- Honey Cup -- Nectar Teaspoon -- Nectar Cup -- Nectar Straw -- Thin Teaspoon -- Thin Cup -- Thin Straw -- Puree -- Mechanical Soft -- Regular -- Multi-consistency -- Pill -- Cervical Esophageal Comment -- PAYNE, COURTNEY 01/02/2022, 3:21 PM  Weston Anna, MA, CCC-SLP                Recent Labs    01/01/22 0526  WBC 8.3  HGB 14.4  HCT 42.6  PLT 292   Recent Labs    01/01/22 0526  NA 135  K 4.0  CL 98  CO2 27  GLUCOSE 145*  BUN 19  CREATININE 0.83  CALCIUM 8.8*     Intake/Output Summary (Last 24 hours) at 01/03/2022 1055 Last data filed at 01/03/2022 0200 Gross per 24 hour  Intake 0 ml  Output 0 ml  Net 0 ml          Physical Exam: Vital Signs Blood pressure (!) 142/78, pulse 97, temperature 98.9 F (37.2 C), temperature source Axillary, resp. rate 16, height 5\' 10"  (1.778 m), weight 86.3 kg, SpO2 100 %.  Constitutional: No distress . Vital signs reviewed. HEENT: NCAT, EOMI, oral membranes moist Neck:stoma closing Cardiovascular: RRR without murmur. No JVD    Respiratory/Chest: CTA Bilaterally without wheezes or rales. Normal effort    GI/Abdomen: BS +, non-tender, non-distended. Foley in place Ext: no clubbing, cyanosis, or edema Psych: pt restless, distracted. Cooperative to an extent Skin: No evidence of breakdown, no evidence of rash. Surgical scar CDI Neuro:  oriented to self. Follows simple commands with extra time. Moves all 4's but inconsistent. Has wrist restraints. Speech dysarthric.  No resting tone. Sensory exam grossly intact Musculoskeletal: cerivcal tenderness. present     Assessment/Plan: 1. Functional deficits which require 3+ hours per day of interdisciplinary therapy in a comprehensive  inpatient rehab setting. Physiatrist is providing close team supervision and 24 hour management of active medical problems listed below. Physiatrist and rehab team continue to assess barriers to discharge/monitor patient progress toward functional and medical goals  Care Tool:  Bathing    Body parts bathed by patient: Right arm, Left arm, Chest, Abdomen, Face   Body parts bathed by helper: Front perineal area, Buttocks, Right upper leg, Left upper leg, Right lower leg, Left lower leg     Bathing assist Assist Level: Maximal Assistance - Patient 24 - 49%     Upper Body Dressing/Undressing Upper body dressing   What is the patient wearing?: Pull over shirt    Upper body assist Assist Level: Minimal Assistance - Patient > 75%    Lower Body Dressing/Undressing Lower body dressing      What is the patient wearing?: Pants, Incontinence brief     Lower body assist Assist for lower body dressing: Maximal Assistance - Patient 25 - 49%     Toileting Toileting    Toileting assist Assist for toileting: 2 Helpers     Transfers Chair/bed transfer  Transfers assist     Chair/bed transfer assist level: Moderate Assistance - Patient 50 - 74% (stand pivot)     Locomotion Ambulation   Ambulation assist   Ambulation activity did not occur: Safety/medical concerns  Assist level: 2 helpers (+2 mod A) Assistive device: Walker-rolling Max distance: 58ft   Walk 10 feet activity   Assist  Walk 10 feet activity did not occur: Safety/medical concerns        Walk 50 feet activity   Assist Walk 50 feet with 2 turns activity did not occur: Safety/medical concerns         Walk 150 feet activity   Assist Walk 150 feet activity did not occur: Safety/medical concerns         Walk 10 feet on uneven surface  activity   Assist Walk 10 feet on uneven surfaces activity did not occur: Safety/medical concerns         Wheelchair     Assist Is the patient  using a wheelchair?: Yes Type of Wheelchair: Manual Wheelchair activity did not occur: Safety/medical concerns (limited by cognitive deficits)         Wheelchair 50 feet with 2 turns activity    Assist    Wheelchair 50 feet with 2 turns activity did not occur: Safety/medical concerns       Wheelchair 150 feet activity     Assist  Wheelchair 150 feet activity did not occur: Safety/medical concerns       Blood pressure (!) 142/78, pulse 97, temperature 98.9 F (37.2 C), temperature source Axillary, resp. rate 16, height 5\' 10"  (1.778 m), weight 86.3 kg, SpO2 100 %.    Medical Problem List and Plan: 1. 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  Status post posterior decompression with craniectomy 11/26/2021             -patient may not yet shower             -ELOS/Goals: 18-22 days, supervision to min assist goals  -Continue CIR therapies including PT, OT, and SLP  -HCT without acute changes. --observe for any ongoing issues today. Suspect pharmaceutical component to clinical presentation  2.  Antithrombotics: -DVT/anticoagulation:  Pharmaceutical: Other (comment) Eliquis             -antiplatelet therapy: N/A 3. Pain Management: Zanaflex 1 mg 3 times daily, Voltaren gel 4 times daily, Fioricet as needed, oxycodone as needed 4. Mood/sleep:   melatonin 5 mg daily as needed             -antipsychotic agents: Zyprexa               -continue sleep chart  1/19 continue seroquel 100mg  qhs  -need sleep chart  -dc klonopin 5. Neuropsych: This patient is not capable of making decisions on his own behalf.  -still requires soft wrist restraints for safety/equipiment--continue 6. Skin/Wound Care: Routine skin checks 7. Fluids/Electrolytes/Nutrition: Routine in and outs with follow-up chemistries             -continue current TF  -protein supplementation thru peg for low albumin  1/19 continue  foley for PEG as long as it continues to function  -given mbs results, change to bolus feeds.  8.  Prolonged ventilatory support.  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     Decannulated 1/16---occlusive dressing to trach stoma 9.  Dysphagia.  Status post gastrostomy tube 12/13/2021 per Dr.Lovick. -continue NPO, trials with SLP 10.  Aspiration pneumonia.  Antibiotic therapy completed.  -afebrile  -aspiration precautions  -hypoxic 1/16, cxr reviewed and unremarkable--sats in 90's now  -can utilize O2 as needed  -maximize time oob 11.  New onset atrial fibrillation.  Started Eliquis 12/17/2021.  Marland Kitchen  Continue low-dose beta-blocker.  1/18 HR controlled Monitor on current meds Vitals:   01/03/22 0609 01/03/22 0700  BP: 130/78 (!) 142/78  Pulse: 97   Resp: 16   Temp: 98.9 F (37.2 C)   SpO2: 100%     12.  Hypertension.  Norvasc 10 mg daily, Cozaar 50 mg daily, , clonidine 0.1 mg every 8 hours.     -1/19 bp well controlled 13.  Hyperlipidemia.  Lipitor 14.  History of BPH/urinary retention.       1/19 - -  generally has been voiding but is incontinent 15.  CAD with CABG times 03/2011 in [place].  Follow-up per cardiology services. 16. Blood clots in stool: none reported since 1/16  1/16-blood counts relatively stable        -hgb stable/increased      -pt is on eliquis     -ua negative, ucx negative   LOS: 9 days A FACE TO FACE EVALUATION WAS PERFORMED  Meredith Staggers 01/03/2022, 10:55 AM

## 2022-01-03 NOTE — Progress Notes (Signed)
Physical Therapy Session Note  Patient Details  Name: [name] MRN: [id number] Date of Birth: 03-Mar-1946  Today's Date: 01/03/2022 PT Individual Time: 0905-1000 and 1300-1335 PT Individual Time Calculation (min): 55 min and 35 min  Short Term Goals: Week 2:  PT Short Term Goal 1 (Week 2): Pt will perform supine<>sit with mod assist consistently PT Short Term Goal 2 (Week 2): Pt will perform sit<>stand with mod assist of 1 consistently PT Short Term Goal 3 (Week 2): Pt will perform bed<>chair transfers with mod assist of 1 consistently PT Short Term Goal 4 (Week 2): Pt will ambulate at least 65f using LRAD with mod assist of 1 and +2 min assist if needed  Skilled Therapeutic Interventions/Progress Updates: Pt presented in bed with nsg present completing meds. Pt noted to be very lethargic at start of session not opening eyes for first 10 min. Pt noted to not have pants on and be incontinent of bladder. Once soft wrist restraints removed and PEG tube disconnected pt rolling L/R with modA to remove brief and PTA performed peri-care and threaded pants total A. Pt then performed supine to sit with modA and use of bed features. While pt sitting EOB pt [facility] however unable to state why he was at hospital. PTA donned shoes total A with +2 present for safety. Pt initially with heavy posterior lean however improved with verbal cues and progressively improved throughout session. Performed stand pivot to TIS with heavy modA and poor eccentric control transitioning to sitting. Pt transported to day room and performed stand pivot to high/low mat in same manner as prior. Pt then participated in seated balance activity tossing horseshoes to encourage reaching high/low and across midline. Pt required facilitation to increase anterior lean however did not demonstrate any LOB nor posterior lean and was ultimately able to sit statically with close supervision. Pt did require  intermittently assist to fully reach objects and was noted to have poor awareness of velocity when throwing consistently underthrowing objects. Pt then participated in Sit to stand with RW x 5 requiring an extended rest break after 3. Pt initially requring modA for Sit to stand then improved with min however requiring max cues for hand placement and poor eccentric control noted with all sits. Vitals checked due to increased dyspnea with all vitals WNL. Pt then ambulated ~167fwith RW and modA with increased R lean noted and difficulty clearing R foot when advancing. PT also required max cues for RW management as pt tended to lift RW for advancement. Pt then transported back to room and remained in TIS with feed tube attached, soft wrist restraints placed, and sister present with current needs met.    Tx2: Pt presented in TIS with sister present. Pt noted to not have wrist restraints on with sister indicating that pt was able to free one hand from restraint and nsg removed second one shortly prior to PTA's arrival. Pt noted to be no distress and was not currently pulling on any lines/leads. Pt denied pain during session. Pt transported to day room to participate in gait training/ambulation activity. Pt ambulated ~5019f 1 with RW and modA progressing to maxA with fatigue. With max multimodal and +2 assisting with RW management pt was able to maintain RW on contact with ground and +2 assisting with steering RW. With verbal cues pt was able to increase advancement of RLE and demonstrated adequate R foot clearance. With fatigue pt noted to increase R  lean then pt stating "I gotta poop, it's coming". Due to decreased safety with ambulation TIS brought to pt and pt transported back to room. Pt then performed stand pivot with modA to [facility] with +2 providing total A for clothing management. Pt was then able to empty bowels with success. Pt then stood with RW and peri-care was completed with total A. Pt tolerated standing  with RW support for ~3 min while peri-are was completed.  Pt attempted to assist with clothing management with pt ultimately requiring modA to pull pants over hips. While in standing pt completed transfer to bed with RW and modA then required minA primately for sequencing to return to supine. Pt left in bed at end of session with soft restraints placed, feed tube connects, bed alarm on, and sister present.      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 General:   Vital Signs: Therapy Vitals Temp: 98.5 F (36.9 C) Temp Source: Oral Pulse Rate: 100 Resp: 20 BP: 99/61 Patient Position (if appropriate): Lying Oxygen Therapy SpO2: 99 % O2 Device: Room Air Pain: Pain Assessment Pain Scale: 0-10 Pain Score: 0-No pain Faces Pain Scale: No hurt PAINAD (Pain Assessment in Advanced Dementia) Breathing: normal Negative Vocalization: none Facial Expression: smiling or inexpressive Body Language: relaxed Consolability: no need to console PAINAD Score: 0 Critical Care Pain Observation Tool (CPOT) Facial Expression: Relaxed, neutral Body Movements: Absence of movements Muscle Tension: Relaxed Mobility:   Locomotion :    Trunk/Postural Assessment :    Balance:   Exercises:   Other Treatments:      Therapy/Group: Individual Therapy  [name] 01/03/2022, 4:51 PM

## 2022-01-04 LAB — GLUCOSE, CAPILLARY
Glucose-Capillary: 100 mg/dL — ABNORMAL HIGH (ref 70–99)
Glucose-Capillary: 122 mg/dL — ABNORMAL HIGH (ref 70–99)
Glucose-Capillary: 142 mg/dL — ABNORMAL HIGH (ref 70–99)
Glucose-Capillary: 95 mg/dL (ref 70–99)
Glucose-Capillary: 96 mg/dL (ref 70–99)

## 2022-01-04 MED ORDER — QUETIAPINE FUMARATE 25 MG PO TABS
25.0000 mg | ORAL_TABLET | Freq: Every day | ORAL | Status: DC
  Administered 2022-01-04 – 2022-01-08 (×5): 25 mg
  Filled 2022-01-04 (×5): qty 1

## 2022-01-04 NOTE — Progress Notes (Signed)
Speech Language Pathology Daily Session Note  Patient Details  Name: [name] MRN: [id number] Date of Birth: Feb 07, 1946  Today's Date: 01/04/2022 SLP Individual Time: 0915-0950 SLP Individual Time Calculation (min): 35 min  Short Term Goals: Week 2: SLP Short Term Goal 1 (Week 2): Patient will sustain attention to functional tasks for 3 minutes with Mod verbal cues for redirection. SLP Short Term Goal 2 (Week 2): Patient will demonstrate orientaton to place, time and situation with Mod verbal and visual cues. SLP Short Term Goal 3 (Week 2): Patient will consume trials of thin liquids without overt s/s of aspiration in 75% of trials and Mod verbal cues for use of swallowing compensatory strategies. SLP Short Term Goal 4 (Week 2): Patient will utilize a second swallow after trials of liquids in 50% of opportunities with Max verbal cues.  Skilled Therapeutic Interventions: Skilled treatment session focused on cognitive and dysphagia goals. Upon arrival, patient was verbally upset due to having to wear restraints. SLP provided passive orientation and education regarding rationale. Patient had been incontinent of urine and soaked through his sheets. Total A for donning TED hoes and pants. Patient transferred to the wheelchair via the stedy with Max verbal cues needed for safety and problem solving with task. Patient reporting dizziness, BP: 96/69. Patient transferred back to bed and BP improved. Patient consumed trials of nectar-thick liquids via straw with consistent overt s/s of aspiration. Recommend patient remain NPO with trials from SLP. Patient left upright in bed with alarm on and his sister-in-law present. Continue with current plan of care.      Pain Pain Assessment Pain Scale: 0-10 Pain Score: 0-No pain  Therapy/Group: Individual Therapy  Campbell Agramonte 01/04/2022, 12:13 PM

## 2022-01-04 NOTE — Progress Notes (Signed)
PROGRESS NOTE   Subjective/Complaints: Nurse reports pt slept until about 0400-0500. Wants wrist restraints off. A little more wound up this morning  ROS: Limited due to cognitive/behavioral    Objective:   CT HEAD WO CONTRAST (5MM)  Result Date: 01/02/2022 CLINICAL DATA:  Postop craniotomy, worsening right-sided weakness. History of cerebellar bleed. EXAM: CT HEAD WITHOUT CONTRAST TECHNIQUE: Contiguous axial images were obtained from the base of the skull through the vertex without intravenous contrast. RADIATION DOSE REDUCTION: This exam was performed according to the departmental dose-optimization program which includes automated exposure control, adjustment of the mA and/or kV according to patient size and/or use of iterative reconstruction technique. COMPARISON:  12/14/2021. FINDINGS: Brain: No acute intracranial hemorrhage, midline shift or mass effect. No hydrocephalus. Craniectomy changes are noted in the occipital region bilaterally. There is herniation of the cerebellum, right greater than left, through the craniectomy defect, not significantly changed from the prior exam. A hypodense region is seen in the cerebellum in this on the right which appear stable. Vascular: No hyperdense vessel or unexpected calcification. Skull: Craniectomy changes are present in the occipital region. No acute fracture. Sinuses/Orbits: Mucosal thickening is present in the right sphenoid sinus and maxillary sinus. The orbits are within normal limits. Other: None. IMPRESSION: 1. No acute intracranial hemorrhage. 2. Stable craniectomy changes in the occipital region with persistent herniation of the cerebellum through the defect. Electronically Signed   By: Brett Fairy M.D.   On: 01/02/2022 21:12   No results for input(s): WBC, HGB, HCT, PLT in the last 72 hours.  No results for input(s): NA, K, CL, CO2, GLUCOSE, BUN, CREATININE, CALCIUM in the last 72  hours.    Intake/Output Summary (Last 24 hours) at 01/04/2022 1038 Last data filed at 01/04/2022 0700 Gross per 24 hour  Intake 0 ml  Output 200 ml  Net -200 ml          Physical Exam: Vital Signs Blood pressure 116/73, pulse 93, temperature 97.8 F (36.6 C), resp. rate 16, height 5\' 10"  (1.778 m), weight 87.1 kg, SpO2 95 %.  Constitutional: No distress . Vital signs reviewed. HEENT: NCAT, EOMI, oral membranes moist Neck: trach stoma closing Cardiovascular: RRR without murmur. No JVD    Respiratory/Chest: CTA Bilaterally without wheezes or rales. Normal effort    GI/Abdomen: BS +, non-tender, non-distended. Foley tube in place Ext: no clubbing, cyanosis, or edema Psych: more restless and agitated this morning Skin: No evidence of breakdown, no evidence of rash. Surgical scar CDI Neuro:  oriented to self. Follows simple commands with extra time. Moves all 4's but inconsistent. Has wrist restraints. Speech dysarthric.  No resting tone. Sensory exam grossly intact Musculoskeletal: cerivcal tenderness. present     Assessment/Plan: 1. Functional deficits which require 3+ hours per day of interdisciplinary therapy in a comprehensive inpatient rehab setting. Physiatrist is providing close team supervision and 24 hour management of active medical problems listed below. Physiatrist and rehab team continue to assess barriers to discharge/monitor patient progress toward functional and medical goals  Care Tool:  Bathing    Body parts bathed by patient: Right arm, Left arm, Chest, Abdomen, Face   Body parts bathed by helper:  Front perineal area, Buttocks, Right upper leg, Left upper leg, Right lower leg, Left lower leg     Bathing assist Assist Level: Maximal Assistance - Patient 24 - 49%     Upper Body Dressing/Undressing Upper body dressing   What is the patient wearing?: Pull over shirt    Upper body assist Assist Level: Minimal Assistance - Patient > 75%    Lower Body  Dressing/Undressing Lower body dressing      What is the patient wearing?: Pants, Incontinence brief     Lower body assist Assist for lower body dressing: Maximal Assistance - Patient 25 - 49%     Toileting Toileting    Toileting assist Assist for toileting: 2 Helpers     Transfers Chair/bed transfer  Transfers assist     Chair/bed transfer assist level:  (stand pivot)     Locomotion Ambulation   Ambulation assist   Ambulation activity did not occur: Safety/medical concerns  Assist level: 2 helpers (+2 mod A) Assistive device: Walker-rolling Max distance: 35ft   Walk 10 feet activity   Assist  Walk 10 feet activity did not occur: Safety/medical concerns        Walk 50 feet activity   Assist Walk 50 feet with 2 turns activity did not occur: Safety/medical concerns         Walk 150 feet activity   Assist Walk 150 feet activity did not occur: Safety/medical concerns         Walk 10 feet on uneven surface  activity   Assist Walk 10 feet on uneven surfaces activity did not occur: Safety/medical concerns         Wheelchair     Assist Is the patient using a wheelchair?: Yes Type of Wheelchair: Manual Wheelchair activity did not occur: Safety/medical concerns (limited by cognitive deficits)         Wheelchair 50 feet with 2 turns activity    Assist    Wheelchair 50 feet with 2 turns activity did not occur: Safety/medical concerns       Wheelchair 150 feet activity     Assist  Wheelchair 150 feet activity did not occur: Safety/medical concerns       Blood pressure 116/73, pulse 93, temperature 97.8 F (36.6 C), resp. rate 16, height 5\' 10"  (1.778 m), weight 87.1 kg, SpO2 95 %.    Medical Problem List and Plan: 1. 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  Status post posterior decompression with craniectomy 11/26/2021              -patient may not yet shower             -ELOS/Goals: 18-22 days, supervision to min assist goals  -Continue CIR therapies including PT, OT, and SLP  -recent HCT without acute changes 2.  Antithrombotics: -DVT/anticoagulation:  Pharmaceutical: Other (comment) Eliquis             -antiplatelet therapy: N/A 3. Pain Management: Zanaflex 1 mg 3 times daily, Voltaren gel 4 times daily, Fioricet as needed, oxycodone as needed 4. Mood/sleep:   melatonin 5 mg daily as needed             -antipsychotic agents: Zyprexa               -continue sleep chart  1/20 continue seroquel 100mg  qhs  - sleep chart not being done  - will add am dose of seroquel 25mg  5. Neuropsych: This patient  is not capable of making decisions on his own behalf.  -still requires soft wrist restraints for safety/equipiment--continue 6. Skin/Wound Care: Routine skin checks 7. Fluids/Electrolytes/Nutrition: Routine in and outs with follow-up chemistries             -continue current TF  -protein supplementation thru peg for low albumin  1/20 continue foley for PEG as long as it continues to function  -appreciate RD's assistance with transition to bolus feeds  8.  Prolonged ventilatory support.  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     Decannulated 1/16---occlusive dressing to trach stoma--closing 9.  Dysphagia.  Status post gastrostomy tube 12/13/2021 per Dr.Lovick. -continue NPO, trials with SLP 10.  Aspiration pneumonia.  Antibiotic therapy completed.  -afebrile  -hypoxic 1/16, cxr reviewed and unremarkable--sats in 90's now  -can utilize O2 as needed  -maximize time oob 11.  New onset atrial fibrillation.  Started Eliquis 12/17/2021.  Marland Kitchen  Continue low-dose beta-blocker.  1/20 HR controlled Monitor on current meds Vitals:   01/03/22 1951 01/04/22 0421  BP: 139/73 116/73  Pulse: 92 93  Resp: 20 16  Temp: 98.3 F (36.8 C) 97.8 F (36.6 C)  SpO2: 93% 95%    12.  Hypertension.  Norvasc 10 mg daily,  Cozaar 50 mg daily, , clonidine 0.1 mg every 8 hours.     -1/20  bp well controlled 13.  Hyperlipidemia.  Lipitor 14.  History of BPH/urinary retention.       1/19 - -generally has been voiding but is incontinent 15.  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  Follow-up per cardiology services. 16. Blood clots in stool:    -resolved  -may recur given he's on eliquis  -pt emptying well, just incontinently   LOS: 10 days A FACE TO FACE EVALUATION WAS PERFORMED  Meredith Staggers 01/04/2022, 10:38 AM

## 2022-01-04 NOTE — Progress Notes (Signed)
Physical Therapy Session Note  Patient Details  Name: [name] MRN: [id number] Date of Birth: 28-Jul-1946  Today's Date: 01/04/2022 PT Individual Time: 7893-8101 PT Individual Time Calculation (min): 60 min   Short Term Goals: Week 2:  PT Short Term Goal 1 (Week 2): Pt will perform supine<>sit with mod assist consistently PT Short Term Goal 2 (Week 2): Pt will perform sit<>stand with mod assist of 1 consistently PT Short Term Goal 3 (Week 2): Pt will perform bed<>chair transfers with mod assist of 1 consistently PT Short Term Goal 4 (Week 2): Pt will ambulate at least 47ft using LRAD with mod assist of 1 and +2 min assist if needed  Skilled Therapeutic Interventions/Progress Updates:     Pt received seated in [facility] and agrees to therapy, though appears slightly agitated. No complaint of pain. WC transport to gym for time management. Pt perform multiple bouts of ambulation. Sit to stand with minA +2 with cues for initiation, pt ambulates x100' with modA +2 3 musketeers style, with PT cueing for upright gaze and posture to improve balance, lateral weight shifting, and increasing R stride length and step height. Following extended seated rest break, pt ambulates additional 100'. PT trials use of EVA walker and pt ambulates x100' with modA +2 and eva walker, but has difficulty keeping gaze up while using AD, and has increased difficulty with turns and transitioning back sitting on mat. Pt performs standing toe taps on 5 inch step to work on attention to task, single leg stance, and contralateral hip and knee flexion. Pt requires mod to maxA +2 for balance and to ensure safe placement of R leg, which tends to adduct and extend beyond base of support when stepping back, 1x10. Pt becomes more agitated and appearing fatigued so PT and tech assist back to [facility] with modA +2. Same assist for transfer back to bed and PT provides totalA for sit to supine due to pt becoming non compliant with commands. Pt left  supine with wrist restraints intact. Pt appears very fatigued and having difficulty keeping eyes open. Pt misses 15 minutes of skilled PT due to fatigue.  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

## 2022-01-04 NOTE — Progress Notes (Signed)
Occupational Therapy Session Note  Patient Details  Name: [name] MRN: [id number] Date of Birth: 06-26-1946  Session 1: Today's Date: 01/04/2022 OT Individual Time: 9381-8299 OT Individual Time Calculation (min): 54 min    Session 2: Today's Date: 01/04/2022 OT Individual Time: 1300-1400 OT Individual Time Calculation (min): 60 min    Short Term Goals: Week 2:  OT Short Term Goal 1 (Week 2): Pt will coordinate LB dressing with no more than mod A OT Short Term Goal 2 (Week 2): Pt will demonstrate intellectual awareness with mod cueing OT Short Term Goal 3 (Week 2): Pt will complete toilet transfer consistently with mod A OT Short Term Goal 4 (Week 2): Pt will require no more than mod cueing for full body bathing initiation and sequencing  Skilled Therapeutic Interventions/Progress Updates:    Session 1: Pt received sitting in the TIS with no c/o or indications of pain. He reported need to use the bathroom. Ambulatory transfer into the bathroom with mod A +2 for safety. He voided urine and had incontinence present in brief. Mod A for hygiene in standing. He returned to the w/c and upon standing reported need to urinate again. Similar transfer and performance. Urine with strong odor. Informed PA of foul smelling urine and possibly increased frequency. Pt completed grooming tasks at the sink, rinsing his hair with a bit of water with min A. Some ideational apraxia present with min cueing required. Pt was taken to the therapy gym where he completed activity with focus on B [facility] and depth perception. Pt returned to his room and was left sitting in the TIS w/c, reclined back. B wrist restraints on. Chair alarm on.    Session 2: Pt received sitting in the TIS w/c. Ambulatory transfer to the bathroom with mod +2 assist, requiring cueing for R foot clearance. Pt required max A for toileting tasks. Pt completed transfer back to his w/c and then reported he needed to use the bathroom again. This  time he voided BM and urine, requiring max A for hygiene. Intermittent dizziness reported and BP was assessed and low (88/59), but it rose to 95/65 with rest break so continued session based on symptom monitoring and with frequent rest breaks. Pt completed 2 bouts of functional mobility, 64 and 78 ft, with mod +2 assist. Cueing required for R LE management, RW positioning, trunk elevation, and pacing. Extended rest break required between each trial. On the BITS pt completed visual scanning and numerical sequencing activities. He required mod cueing initially for initiation and sustained attention to task. Once he got started he was able to sustain attention for 90 sec. He accurately sequenced numerically, 1-19. He requried mod facilitation for RUE shoulder flexion. Pt returned to his room and was left sitting in the TIS w/c, reclined back. B wrist restraints on. Chair alarm on.    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

## 2022-01-05 LAB — GLUCOSE, CAPILLARY
Glucose-Capillary: 107 mg/dL — ABNORMAL HIGH (ref 70–99)
Glucose-Capillary: 107 mg/dL — ABNORMAL HIGH (ref 70–99)
Glucose-Capillary: 118 mg/dL — ABNORMAL HIGH (ref 70–99)
Glucose-Capillary: 132 mg/dL — ABNORMAL HIGH (ref 70–99)
Glucose-Capillary: 90 mg/dL (ref 70–99)
Glucose-Capillary: 96 mg/dL (ref 70–99)

## 2022-01-05 NOTE — Progress Notes (Signed)
PROGRESS NOTE   Subjective/Complaints: Sleeping soundly, arouses to wave, then falls asleep again Wife at bedside, performing hand massage She notes he has tried to pull PEG when out of restraints Sister to visit later today  ROS: Limited due to cognitive/behavioral    Objective:   No results found. No results for input(s): WBC, HGB, HCT, PLT in the last 72 hours.  No results for input(s): NA, K, CL, CO2, GLUCOSE, BUN, CREATININE, CALCIUM in the last 72 hours.    Intake/Output Summary (Last 24 hours) at 01/05/2022 1637 Last data filed at 01/04/2022 1806 Gross per 24 hour  Intake 0 ml  Output --  Net 0 ml          Physical Exam: Vital Signs Blood pressure (!) 96/56, pulse 64, temperature 98.6 F (37 C), temperature source Oral, resp. rate 17, height 5\' 10"  (1.778 m), weight 84.1 kg, SpO2 97 %.  Gen: no distress, normal appearing HEENT: oral mucosa pink and moist, NCAT Cardio: Reg rate Chest: normal effort, normal rate of breathing Abd: soft, non-distended Ext: no edema  Psych: more restless and agitated this morning Skin: No evidence of breakdown, no evidence of rash. Surgical scar CDI Neuro:  oriented to self. Follows simple commands with extra time. Moves all 4's but inconsistent. Has wrist restraints. Speech dysarthric.  No resting tone. Sensory exam grossly intact Musculoskeletal: cerivcal tenderness. present     Assessment/Plan: 1. Functional deficits which require 3+ hours per day of interdisciplinary therapy in a comprehensive inpatient rehab setting. Physiatrist is providing close team supervision and 24 hour management of active medical problems listed below. Physiatrist and rehab team continue to assess barriers to discharge/monitor patient progress toward functional and medical goals  Care Tool:  Bathing    Body parts bathed by patient: Right arm, Left arm, Chest, Abdomen, Face   Body parts  bathed by helper: Front perineal area, Buttocks, Right upper leg, Left upper leg, Right lower leg, Left lower leg     Bathing assist Assist Level: Maximal Assistance - Patient 24 - 49%     Upper Body Dressing/Undressing Upper body dressing   What is the patient wearing?: Pull over shirt    Upper body assist Assist Level: Minimal Assistance - Patient > 75%    Lower Body Dressing/Undressing Lower body dressing      What is the patient wearing?: Pants, Incontinence brief     Lower body assist Assist for lower body dressing: Maximal Assistance - Patient 25 - 49%     Toileting Toileting    Toileting assist Assist for toileting: 2 Helpers     Transfers Chair/bed transfer  Transfers assist     Chair/bed transfer assist level:  (stand pivot)     Locomotion Ambulation   Ambulation assist   Ambulation activity did not occur: Safety/medical concerns  Assist level: 2 helpers (+2 mod A) Assistive device: Walker-rolling Max distance: 21ft   Walk 10 feet activity   Assist  Walk 10 feet activity did not occur: Safety/medical concerns        Walk 50 feet activity   Assist Walk 50 feet with 2 turns activity did not occur: Safety/medical concerns  Walk 150 feet activity   Assist Walk 150 feet activity did not occur: Safety/medical concerns         Walk 10 feet on uneven surface  activity   Assist Walk 10 feet on uneven surfaces activity did not occur: Safety/medical concerns         Wheelchair     Assist Is the patient using a wheelchair?: Yes Type of Wheelchair: Manual Wheelchair activity did not occur: Safety/medical concerns (limited by cognitive deficits)         Wheelchair 50 feet with 2 turns activity    Assist    Wheelchair 50 feet with 2 turns activity did not occur: Safety/medical concerns       Wheelchair 150 feet activity     Assist  Wheelchair 150 feet activity did not occur: Safety/medical  concerns       Blood pressure (!) 96/56, pulse 64, temperature 98.6 F (37 C), temperature source Oral, resp. rate 17, height 5\' 10"  (1.778 m), weight 84.1 kg, SpO2 97 %.    Medical Problem List and Plan: 1. 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  Status post posterior decompression with craniectomy 11/26/2021             -patient may not yet shower             -ELOS/Goals: 18-22 days, supervision to min assist goals  -Continue CIR therapies including PT, OT, and SLP  -recent HCT without acute changes -reviewed Dr. Charm Barges 1/20 note with wife to give her medical update 2.  Antithrombotics: -DVT/anticoagulation:  Pharmaceutical: Other (comment) Eliquis             -antiplatelet therapy: N/A 3. Pain Management: Tizanidine no longer ordered, Voltaren gel 4 times daily, Fioricet as needed, oxycodone as needed 4. Mood/sleep:   melatonin 5 mg daily as needed             -antipsychotic agents: Zyprexa               -continue sleep chart  1/20 continue seroquel 100mg  qhs  - sleep chart not being done  - will add am dose of seroquel 25mg  5. Neuropsych: This patient is not capable of making decisions on his own behalf.  -still requires soft wrist restraints for safety/equipiment--continue 6. Skin/Wound Care: Routine skin checks 7. Fluids/Electrolytes/Nutrition: Routine in and outs with follow-up chemistries             -continue current TF  -protein supplementation thru peg for low albumin  1/20 continue foley for PEG as long as it continues to function  -appreciate RD's assistance with transition to bolus feeds  8.  Prolonged ventilatory support.  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     Decannulated 1/16---occlusive dressing to trach stoma--closing 9.  Dysphagia.  Status post gastrostomy tube 12/13/2021 per Dr.Lovick. -continue NPO, trials with SLP 10.  Aspiration pneumonia.  Antibiotic  therapy completed.  -afebrile  -hypoxic 1/16, cxr reviewed and unremarkable--sats in 90's now  -can utilize O2 as needed  -maximize time oob 11.  New onset atrial fibrillation.  Started Eliquis 12/17/2021.  Marland Kitchen Continue low-dose beta-blocker.  1/21 HR controlled Monitor on current meds Vitals:   01/05/22 0418 01/05/22 1258  BP: 117/65 (!) 96/56  Pulse: 78 64  Resp: 20 17  Temp: 98.8 F (37.1 C) 98.6 F (37 C)  SpO2: 99% 97%    12.  Hypertension.  Norvasc 10 mg daily,  Cozaar 50 mg daily, , clonidine 0.1 mg every 8 hours.     -1/21  bp fluctuates: continue current regimen.  13.  Hyperlipidemia.  Lipitor 14.  History of BPH/urinary retention.       1/19 - -generally has been voiding but is incontinent 15.  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  Follow-up per cardiology services. 16. Blood clots in stool:    -resolved  -may recur given he's on eliquis  -pt emptying well, just incontinently   LOS: 11 days A FACE TO FACE EVALUATION WAS PERFORMED  Clide Deutscher Rennae Ferraiolo 01/05/2022, 4:37 PM

## 2022-01-05 NOTE — Progress Notes (Signed)
Speech Language Pathology Note  Patient Details  Name: [name] MRN: [id number] Date of Birth: 06/25/1946  Today's Date: 01/05/2022 SLP Missed Time: 47 Minutes Missed Time Reason: Patient fatigue  SLP Cancellation: Upon entry, pt sleeping very soundly. With max cues and extra time, pt rouses but only able to sustain alertness <15 seconds before eye closing and falling back into deep sleep. Pt unable to participate in skilled therapy at this time. Cont ST POC.

## 2022-01-06 LAB — GLUCOSE, CAPILLARY
Glucose-Capillary: 103 mg/dL — ABNORMAL HIGH (ref 70–99)
Glucose-Capillary: 119 mg/dL — ABNORMAL HIGH (ref 70–99)
Glucose-Capillary: 128 mg/dL — ABNORMAL HIGH (ref 70–99)
Glucose-Capillary: 88 mg/dL (ref 70–99)
Glucose-Capillary: 92 mg/dL (ref 70–99)
Glucose-Capillary: 94 mg/dL (ref 70–99)

## 2022-01-06 NOTE — Progress Notes (Signed)
°   01/06/22 1527  What Happened  Was fall witnessed? No  Was patient injured? No  Patient found on floor  Found by Staff-comment Steffanie Dunn, RN / Merrily Pew, LPN)  Stated prior activity bathroom-unassisted  Follow Up  MD notified Leeroy Cha, MD  Time MD notified [phone]  Family notified Yes - comment (spouse, Amalio Loe)  Time family notified 1549  Additional tests No  Simple treatment Other (comment) (pt declined ice pack, Tylenol)  Progress note created (see row info) Yes  Adult Fall Risk Assessment  Risk Factor Category (scoring not indicated) Fall has occurred during this admission (document High fall risk) (01/06/2022)  Age 76  Fall History: Fall within 6 months prior to admission 0  Elimination; Bowel and/or Urine Incontinence 2  Elimination; Bowel and/or Urine Urgency/Frequency 2  Medications: includes PCA/Opiates, Anti-convulsants, Anti-hypertensives, Diuretics, Hypnotics, Laxatives, Sedatives, and Psychotropics 3  Patient Care Equipment 2  Mobility-Assistance 2  Mobility-Gait 2  Mobility-Sensory Deficit 2  Altered awareness of immediate physical environment 1  Impulsiveness 2  Lack of understanding of one's physical/cognitive limitations 4  Total Score 24  Patient Fall Risk Level High fall risk  Adult Fall Risk Interventions  Required Bundle Interventions *See Row Information* High fall risk - low, moderate, and high requirements implemented  Additional Interventions Use of appropriate toileting equipment (bedpan, BSC, etc.);Room near [place] for Fall Injury Risk (To be completed on HIGH fall risk patients) - Assessing Need for Floor Mats  Risk For Fall Injury- Criteria for Floor Mats Confusion/dementia (+NuDESC, CIWA, TBI, etc.);Noncompliant with safety precautions;Previous fall this admission  Will Implement Floor Mats Yes  Vitals  Temp 97.6 F (36.4 C)  Temp Source Oral  BP 131/66  MAP (mmHg) 83  BP Location Left Arm  BP Method Automatic   Patient Position (if appropriate) Sitting  Pulse Rate 84  Resp 18  Oxygen Therapy  SpO2 100 %  O2 Device Room Air  Pain Assessment  Pain Scale 0-10  Pain Score 0  Faces Pain Scale 0  PCA/Epidural/Spinal Assessment  Respiratory Pattern Regular;Unlabored  Neurological  Neuro (WDL) X  Level of Consciousness Alert  Orientation Level Oriented to person  Cognition Impulsive;Poor judgement;Poor safety awareness  Speech Clear  Pupil Assessment  Yes  R Pupil Size (mm) 3  R Pupil Shape Round  R Pupil Reaction Brisk  L Pupil Size (mm) 3  L Pupil Shape Round  L Pupil Reaction Brisk  R Hand Grip Moderate  L Hand Grip Moderate   RUE Motor Response Purposeful movement  LUE Motor Response Purposeful movement  RLE Motor Response Purposeful movement  LLE Motor Response Purposeful movement  Neuro Symptoms Agitation;Forgetful  Neuro symptoms relieved by Rest  Musculoskeletal  Musculoskeletal (WDL) X  Assistive Device Alliance;Wheelchair  Generalized Weakness Yes  Weight Bearing Restrictions No  Musculoskeletal Details  Right Lower Leg Weakness  Left Lower Leg Weakness  Integumentary  Integumentary (WDL) X  Skin Color Appropriate for ethnicity  Skin Condition Dry  Skin Integrity Catheter entry/exit site;Ecchymosis  Catheter Entry/Exit Location Abdomen  Catheter Entry/Exit Location Orientation Left;Upper  Catheter Entry/Exit Intervention Foam  Ecchymosis Location Abdomen  Ecchymosis Location Orientation Anterior;Lower  Ecchymosis Intervention Other (Comment) (assessed)  Skin Turgor Non-tenting

## 2022-01-06 NOTE — Progress Notes (Signed)
PROGRESS NOTE   Subjective/Complaints: Sleeping soundly Tolerated SLP today Denies pain Alert during therapy session  ROS: Limited due to cognitive/behavioral    Objective:   No results found. No results for input(s): WBC, HGB, HCT, PLT in the last 72 hours.  No results for input(s): NA, K, CL, CO2, GLUCOSE, BUN, CREATININE, CALCIUM in the last 72 hours.   No intake or output data in the 24 hours ending 01/06/22 1312         Physical Exam: Vital Signs Blood pressure 107/62, pulse 73, temperature 98.6 F (37 C), resp. rate 16, height 5\' 10"  (1.778 m), weight 84.5 kg, SpO2 96 %.  Gen: no distress, normal appearing HEENT: oral mucosa pink and moist, NCAT Cardio: Reg rate Chest: normal effort, normal rate of breathing Abd: soft, non-distended Ext: no edema  Psych: more restless and agitated this morning Skin: No evidence of breakdown, no evidence of rash. Surgical scar CDI Neuro:  oriented to self. Follows simple commands with extra time. Moves all 4's but inconsistent. Has wrist restraints. Speech dysarthric.  No resting tone. Sensory exam grossly intact Musculoskeletal: cerivcal tenderness. present     Assessment/Plan: 1. Functional deficits which require 3+ hours per day of interdisciplinary therapy in a comprehensive inpatient rehab setting. Physiatrist is providing close team supervision and 24 hour management of active medical problems listed below. Physiatrist and rehab team continue to assess barriers to discharge/monitor patient progress toward functional and medical goals  Care Tool:  Bathing    Body parts bathed by patient: Right arm, Left arm, Chest, Abdomen, Face   Body parts bathed by helper: Front perineal area, Buttocks, Right upper leg, Left upper leg, Right lower leg, Left lower leg     Bathing assist Assist Level: Maximal Assistance - Patient 24 - 49%     Upper Body  Dressing/Undressing Upper body dressing   What is the patient wearing?: Pull over shirt    Upper body assist Assist Level: Minimal Assistance - Patient > 75%    Lower Body Dressing/Undressing Lower body dressing      What is the patient wearing?: Pants, Incontinence brief     Lower body assist Assist for lower body dressing: Maximal Assistance - Patient 25 - 49%     Toileting Toileting    Toileting assist Assist for toileting: 2 Helpers     Transfers Chair/bed transfer  Transfers assist     Chair/bed transfer assist level:  (stand pivot)     Locomotion Ambulation   Ambulation assist   Ambulation activity did not occur: Safety/medical concerns  Assist level: 2 helpers (+2 mod A) Assistive device: Walker-rolling Max distance: 21ft   Walk 10 feet activity   Assist  Walk 10 feet activity did not occur: Safety/medical concerns        Walk 50 feet activity   Assist Walk 50 feet with 2 turns activity did not occur: Safety/medical concerns         Walk 150 feet activity   Assist Walk 150 feet activity did not occur: Safety/medical concerns         Walk 10 feet on uneven surface  activity   Assist Walk 10 feet  on uneven surfaces activity did not occur: Safety/medical concerns         Wheelchair     Assist Is the patient using a wheelchair?: Yes Type of Wheelchair: Manual Wheelchair activity did not occur: Safety/medical concerns (limited by cognitive deficits)         Wheelchair 50 feet with 2 turns activity    Assist    Wheelchair 50 feet with 2 turns activity did not occur: Safety/medical concerns       Wheelchair 150 feet activity     Assist  Wheelchair 150 feet activity did not occur: Safety/medical concerns       Blood pressure 107/62, pulse 73, temperature 98.6 F (37 C), resp. rate 16, height 5\' 10"  (1.778 m), weight 84.5 kg, SpO2 96 %.    Medical Problem List and Plan: 1. 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  Status post posterior decompression with craniectomy 11/26/2021             -patient may not yet shower             -ELOS/Goals: 18-22 days, supervision to min assist goals  -Continue CIR therapies including PT, OT, and SLP  -recent HCT without acute changes -reviewed Dr. Charm Barges 1/20 note with wife to give her medical update 2.  Antithrombotics: -DVT/anticoagulation:  Pharmaceutical: Other (comment) Eliquis             -antiplatelet therapy: N/A 3. Pain Management: Tizanidine no longer ordered, Voltaren gel 4 times daily, Fioricet as needed, oxycodone as needed 4. Mood/sleep:   melatonin 5 mg daily as needed             -antipsychotic agents: Zyprexa               -continue sleep chart  1/20 continue seroquel 100mg  qhs  - sleep chart not being done  - continue am dose of seroquel 25mg  5. Neuropsych: This patient is not capable of making decisions on his own behalf.  -still requires soft wrist restraints for safety/equipiment--continue 6. Skin/Wound Care: Routine skin checks 7. Fluids/Electrolytes/Nutrition: Routine in and outs with follow-up chemistries             -continue current TF  -protein supplementation thru peg for low albumin  Continue foley for PEG as long as it continues to function  -appreciate RD's assistance with transition to bolus feeds  8.  Prolonged ventilatory support.  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     Decannulated 1/16---occlusive dressing to trach stoma--closing 9.  Dysphagia.  Status post gastrostomy tube 12/13/2021 per Dr.Lovick. -continue NPO, trials with SLP 10.  Aspiration pneumonia.  Antibiotic therapy completed.  -afebrile  -hypoxic 1/16, cxr reviewed and unremarkable--sats in 90's now  -can utilize O2 as needed  -maximize time oob 11.  New onset atrial fibrillation.  Started Eliquis 12/17/2021.  Marland Kitchen Continue low-dose  beta-blocker.  1/22 HR controlled Monitor on current meds Vitals:   01/05/22 1942 01/06/22 0413  BP: 108/60 107/62  Pulse: 62 73  Resp: 18 16  Temp: 98 F (36.7 C) 98.6 F (37 C)  SpO2: 98% 96%    12.  Hypertension.  Norvasc 10 mg daily, Cozaar 50 mg daily, , clonidine 0.1 mg every 8 hours.     -1/21  bp fluctuates: continue current regimen.  13.  Hyperlipidemia.  Lipitor 14.  History of BPH/urinary retention.       1/19 - -generally has been  voiding but is incontinent 15.  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  Follow-up per cardiology services. 16. Blood clots in stool:    -resolved  -may recur given he's on eliquis  -pt emptying well, just incontinently   LOS: 12 days A FACE TO FACE EVALUATION WAS PERFORMED  Mathius Birkeland P Vonna Brabson 01/06/2022, 1:12 PM

## 2022-01-06 NOTE — Progress Notes (Signed)
Occupational Therapy Session Note  Patient Details  Name: [name] MRN: [id number] Date of Birth: [birth year]  Today's Date: 01/06/2022 OT Individual Time: 1400-1430 OT Individual Time Calculation (min): 30 min     Skilled Therapeutic Interventions/Progress Updates:    1:1 Pt in bed when arrived. Donned TEDS and socks total A. Pt reported needing to go to the bathroom. Pt ambulated to the bathroom with RW with mod A +2 with A for pt's balance and for management of RW. Pt very ataxic and poor motor control in LEs and trunk with increase degree of freedom.  On the way to the bathroom pt's brief fell off and pt voiding as we continued to the bathroom- finished voiding on the toilet. Total A for toileting tasks. Pt ambulated back to the tilt in space w/c without RW with mod A (less to manage the RW). Pt engaged in dressing with extra time to orient clothing. Min A to don shirt and pants. At the sink engaged in helping to shave- including washing face and donning shaving cream Pt continues to present with language of confusion and word finding issues.   Pt left sitting up with safety belt and restraints donned. Nursing aware.   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

## 2022-01-06 NOTE — Progress Notes (Signed)
Speech Language Pathology Daily Session Note  Patient Details  Name: [name] MRN: [id number] Date of Birth: November 28, 1946  Today's Date: 01/06/2022 SLP Individual Time: 4332-9518 SLP Individual Time Calculation (min): 45 min  Short Term Goals: Week 2: SLP Short Term Goal 1 (Week 2): Patient will sustain attention to functional tasks for 3 minutes with Mod verbal cues for redirection. SLP Short Term Goal 2 (Week 2): Patient will demonstrate orientaton to place, time and situation with Mod verbal and visual cues. SLP Short Term Goal 3 (Week 2): Patient will consume trials of thin liquids without overt s/s of aspiration in 75% of trials and Mod verbal cues for use of swallowing compensatory strategies. SLP Short Term Goal 4 (Week 2): Patient will utilize a second swallow after trials of liquids in 50% of opportunities with Max verbal cues.  Skilled Therapeutic Interventions:  Pt was seen for skilled ST targeting goals for swallowing and cognition.  Pt was in bed, awake, with lights off upon therapist's arrival.  He was initially mildly agitated with complaints vaguely centered around his care.  However, pt was easily redirected and agreeable to participating in treatment.  SLP made environmental modifications such as opening blinds, turning on lights, and sitting pt upright in bed to minimize confusion and maximize safe participation in treatment session.  Bilateral wrist restraints were removed for the duration of today's treatment with pt exhibiting no unsafe behaviors.  SLP facilitated the session with trials of nectar thick liquids via straw following oral care to continue working towards initiation of PO diet.  Pt naturally took small sips of thickened liquids from the straw but continued to exhibit delayed coughing which SLP suspects is related to aspiration and/or penetration of pharyngeal residue as indicated on most recent MBS.  Pt was intermittently able to recall "They don't want me to  drink right now," but his recall of rationale behind NPO status was incorrect.  Therefore SLP reinforced information and provided encouragement  throughout therapy session.  Throughout session, SLP provided passive orientation to place, time and situation during functional conversations with pt in order to minimize periods of confusion and agitation.  Pt was able to appropriately participate in functional conversations by providing basic biographical information regarding his personal history or interests.  Pt was left in bed with bed alarm set and bilateral wrist restraints replaced.  Continue per current plan of care.    Pain Pain Assessment Pain Scale: 0-10 Pain Score: 0-No pain Faces Pain Scale: No hurt  Therapy/Group: Individual Therapy  Nathaniel Yaden, Selinda Orion 01/06/2022, 12:19 PM

## 2022-01-07 LAB — GLUCOSE, CAPILLARY
Glucose-Capillary: 100 mg/dL — ABNORMAL HIGH (ref 70–99)
Glucose-Capillary: 101 mg/dL — ABNORMAL HIGH (ref 70–99)
Glucose-Capillary: 101 mg/dL — ABNORMAL HIGH (ref 70–99)
Glucose-Capillary: 101 mg/dL — ABNORMAL HIGH (ref 70–99)
Glucose-Capillary: 130 mg/dL — ABNORMAL HIGH (ref 70–99)
Glucose-Capillary: 146 mg/dL — ABNORMAL HIGH (ref 70–99)

## 2022-01-07 NOTE — Progress Notes (Signed)
Physical Therapy Session Note  Patient Details  Name: [name] MRN: [id number] Date of Birth: [birth year]  Today's Date: 01/07/2022 PT Individual Time: 1102-1158 PT Individual Time Calculation (min): 56 min   Short Term Goals: Week 2:  PT Short Term Goal 1 (Week 2): Pt will perform supine<>sit with mod assist consistently PT Short Term Goal 2 (Week 2): Pt will perform sit<>stand with mod assist of 1 consistently PT Short Term Goal 3 (Week 2): Pt will perform bed<>chair transfers with mod assist of 1 consistently PT Short Term Goal 4 (Week 2): Pt will ambulate at least 67ft using LRAD with mod assist of 1 and +2 min assist if needed  Skilled Therapeutic Interventions/Progress Updates:     Pt received seated in [facility] and agrees to therapy. No complaint of pain. WC transport to gym for time management. Sit to stand multiple times during session with minA/modA +2. Pt able to stand without much physical assistance but tends to lose balance quickly upon standing, requiring +2 for stabilization. Pt ambulates x100' with modA +2 HHA. Initially pt maintaining close to symmetrical reciprocal swing through gait pattern but with fatigue R lower extremity tends to have shorter stride lengths and more ataxic pattern. Pt takes extended seated rest break prior to additional mobility. Pt then ambulates x100' with same assist through having x1 instance of complete LOB/knee buckling while making a turn, requiring totalA +2 to maintain balance. Additional cues provided to slow down sequence of steps during turns.  Pt ambulates x65', up 4 6" steps with bilateral hand rails and down 8 3" steps with bilateral hand rails, then additional 65' back to [facility]. As pt fatigues, both knees begin to flex during stance phase, requiring increasing assistance up to maxA +2.  Pt ambulates x140' with HHA +2 and similar gait mechanics. Final seated rest break prior to ambulating 100' back to room. Left seated in Tilt in space WC with  wrist restraints intact, alarm intact, and sister present.  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    Therapy/Group: Individual Therapy  Breck Coons, PT, DPT 01/07/2022, 3:48 PM

## 2022-01-07 NOTE — Progress Notes (Signed)
Physical Therapy Session Note  Patient Details  Name: [name] MRN: [id number] Date of Birth: 05/27/1946  Today's Date: 01/07/2022 PT Individual Time: 1335-1418 PT Individual Time Calculation (min): 43 min   Short Term Goals: Week 1:  PT Short Term Goal 1 (Week 1): Patient will perform bed mobility with mod A in a flat bed without rails. PT Short Term Goal 1 - Progress (Week 1): Progressing toward goal PT Short Term Goal 2 (Week 1): Patient will perform basic transfers with mod A consistently. PT Short Term Goal 2 - Progress (Week 1): Progressing toward goal PT Short Term Goal 3 (Week 1): Patient will initiate gait training >10 feet. PT Short Term Goal 3 - Progress (Week 1): Met Week 2:  PT Short Term Goal 1 (Week 2): Pt will perform supine<>sit with mod assist consistently PT Short Term Goal 2 (Week 2): Pt will perform sit<>stand with mod assist of 1 consistently PT Short Term Goal 3 (Week 2): Pt will perform bed<>chair transfers with mod assist of 1 consistently PT Short Term Goal 4 (Week 2): Pt will ambulate at least 21f using LRAD with mod assist of 1 and +2 min assist if needed Week 3:     Skilled Therapeutic Interventions/Progress Updates:    Pt initially oob in wc w/bilat UE restraints, family at side.  Pt requesting to use BR. Commode transfer using Stedy and overall min assist and max cues for safety. Pt continent of urine/incontinent in brief as well so changed by therapist.   Total assist for clothing management. Pt transferred back to wc w/Stedy and transported to long hallway 4MW  Gait: 2123fx 2 (end of 4MW hall to dayroom) using grocery cart w/mild resistance b+2 assist to either side/overall min of 2 to promote core activation and uprithgt posture during gait.  Pt w/improved midline orientation and upright posture, decreased clearance RLE thru swing w/full clearance approx 20% steps, did not scissor/cross over step.  Cues for forward gaze during gait.   HR  90-100after several min seated rest as pulse was difficult to palpate and no good reading from dynamap, carotid pulse palpated, w/regular rate and rhythm to palpation.  At end of session, pt transported to room.  UE restraints applied.  Pt left oob in TIS/tilted for safety and  w/chair alarm set and needs in reach.  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   Therapy/Group: Individual Therapy BaCallie FieldingPTBaggs/23/2023, 3:59 PM

## 2022-01-07 NOTE — Progress Notes (Signed)
Having Tele sitter ordered to monitor pt activty

## 2022-01-07 NOTE — Plan of Care (Addendum)
Behavioral Plan: Dahlia Client   Behavior to decrease/ eliminate:  - Restless, - Impulsivity, getting up without assistance  - Worsening behaviors in the evening/night  -agitation with staff, stimulated by urinary urgency  Changes to environment:  - Lights on/blinds open during the day -room set up as picture to promote safe toileting -BSC next to bed for quicker toileting (close door for privacy) -enclosure bed close to the bathroom if patient non-compliant with BSC  -+2 for ambulation to bathroom, +1 for stand pivot to [facility] or TIS w/c   Interventions: - Waist restraint in TIS w/c - **Timed toileting day and night** -enclosure bed for daytime napping and night time sleeping -TIS w/c for sitting up with waist restraint and one-on-one supervision from family or at nurses station   Recommendations for interactions with patient: - Provide orientation throughout the day  -offer toileting every interaction  Attendees:  - Laverle Hobby, OT - Weston Anna, SLP - Apolinar Junes, PT  -Dorthula Nettles, RN

## 2022-01-07 NOTE — Progress Notes (Signed)
Speech Language Pathology Daily Session Note  Patient Details  Name: [name] MRN: [id number] Date of Birth: October 10, 1946  Today's Date: 01/07/2022 SLP Individual Time: 6979-4801 SLP Individual Time Calculation (min): 45 min  Short Term Goals: Week 2: SLP Short Term Goal 1 (Week 2): Patient will sustain attention to functional tasks for 3 minutes with Mod verbal cues for redirection. SLP Short Term Goal 2 (Week 2): Patient will demonstrate orientaton to place, time and situation with Mod verbal and visual cues. SLP Short Term Goal 3 (Week 2): Patient will consume trials of thin liquids without overt s/s of aspiration in 75% of trials and Mod verbal cues for use of swallowing compensatory strategies. SLP Short Term Goal 4 (Week 2): Patient will utilize a second swallow after trials of liquids in 50% of opportunities with Max verbal cues.  Skilled Therapeutic Interventions: Skilled treatment session focused on dysphagia and cognitive goals. Upon arrival, patient was asleep while upright in the tilt-in-space wheelchair. Patient easily roused with language of confusion evident. SLP provided oral care via the suction toothbrush. Patient consumed ice chips while semi-reclined in the wheelchair without overt s/s of aspiration. Patient then consumed trials of water with consistent overt s/s of aspiration with Max verbal cues for use of multiple swallows. Patient then consumed trials of nectar-thick liquids via straw with overt s/s of aspiration in 50% of trials with Mod verbal cues needed for use of multiple swallows. Recommend patient remain NPO with trials from SLP. Patient verbose throughout session but able to maintain topic of conversation for ~4 turns. SLP also provided passive orientation as patient thought yesterday was his [birth year] Patient remained calm and cooperative throughout session. Patient left upright in wheelchair with restraints in place and all needs within reach. Continue with  current plan of care.      Pain No/Denies Pain   Therapy/Group: Individual Therapy  Bryon Parker 01/07/2022, 12:17 PM

## 2022-01-07 NOTE — Progress Notes (Signed)
PROGRESS NOTE   Subjective/Complaints: Up in chair. Alert. Tells me he is just realizing he's in the hospital. Doesn't know how her got here. Fell yesterday after managing to un do his wrist restraints.   ROS: Limited due to cognitive/behavioral    Objective:   No results found. No results for input(s): WBC, HGB, HCT, PLT in the last 72 hours.  No results for input(s): NA, K, CL, CO2, GLUCOSE, BUN, CREATININE, CALCIUM in the last 72 hours.    Intake/Output Summary (Last 24 hours) at 01/07/2022 1140 Last data filed at 01/07/2022 0437 Gross per 24 hour  Intake --  Output 100 ml  Net -100 ml           Physical Exam: Vital Signs Blood pressure 126/68, pulse 68, temperature 98.2 F (36.8 C), resp. rate 18, height 5\' 10"  (1.778 m), weight 84.5 kg, SpO2 100 %.  Constitutional: No distress . Vital signs reviewed. HEENT: NCAT, EOMI, oral membranes moist Neck: supple, trach stoma almost closed Cardiovascular: RRR without murmur. No JVD    Respiratory/Chest: CTA Bilaterally without wheezes or rales. Normal effort    GI/Abdomen: BS +, non-tender, non-distended, foley for PEG Ext: no clubbing, cyanosis, or edema Psych: pleasant but confused Skin: No evidence of breakdown, no evidence of rash. Surgical scar CDI Neuro:  very alert, speech clear. oriented to self. Follows simple commands with extra time. Moves all 4's but inconsistent. Has wrist restraints.   No resting tone. Sensory exam grossly intact Musculoskeletal: cerivcal tenderness. present     Assessment/Plan: 1. Functional deficits which require 3+ hours per day of interdisciplinary therapy in a comprehensive inpatient rehab setting. Physiatrist is providing close team supervision and 24 hour management of active medical problems listed below. Physiatrist and rehab team continue to assess barriers to discharge/monitor patient progress toward functional and medical  goals  Care Tool:  Bathing    Body parts bathed by patient: Right arm, Left arm, Chest, Abdomen, Face   Body parts bathed by helper: Front perineal area, Buttocks, Right upper leg, Left upper leg, Right lower leg, Left lower leg     Bathing assist Assist Level: Maximal Assistance - Patient 24 - 49%     Upper Body Dressing/Undressing Upper body dressing   What is the patient wearing?: Pull over shirt    Upper body assist Assist Level: Minimal Assistance - Patient > 75%    Lower Body Dressing/Undressing Lower body dressing      What is the patient wearing?: Pants, Incontinence brief     Lower body assist Assist for lower body dressing: Maximal Assistance - Patient 25 - 49%     Toileting Toileting    Toileting assist Assist for toileting: 2 Helpers     Transfers Chair/bed transfer  Transfers assist     Chair/bed transfer assist level:  (stand pivot)     Locomotion Ambulation   Ambulation assist   Ambulation activity did not occur: Safety/medical concerns  Assist level: 2 helpers (+2 mod A) Assistive device: Walker-rolling Max distance: 68ft   Walk 10 feet activity   Assist  Walk 10 feet activity did not occur: Safety/medical concerns  Walk 50 feet activity   Assist Walk 50 feet with 2 turns activity did not occur: Safety/medical concerns         Walk 150 feet activity   Assist Walk 150 feet activity did not occur: Safety/medical concerns         Walk 10 feet on uneven surface  activity   Assist Walk 10 feet on uneven surfaces activity did not occur: Safety/medical concerns         Wheelchair     Assist Is the patient using a wheelchair?: Yes Type of Wheelchair: Manual Wheelchair activity did not occur: Safety/medical concerns (limited by cognitive deficits)         Wheelchair 50 feet with 2 turns activity    Assist    Wheelchair 50 feet with 2 turns activity did not occur: Safety/medical  concerns       Wheelchair 150 feet activity     Assist  Wheelchair 150 feet activity did not occur: Safety/medical concerns       Blood pressure 126/68, pulse 68, temperature 98.2 F (36.8 C), resp. rate 18, height 5\' 10"  (1.778 m), weight 84.5 kg, SpO2 100 %.    Medical Problem List and Plan: 1. 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  Status post posterior decompression with craniectomy 11/26/2021             -patient may not yet shower             -ELOS/Goals: 18-22 days, supervision to min assist goals  -Continue CIR therapies including PT, OT, and SLP  -recent HCT without acute changes -pt with fall yesterday, fortunately no sequelae 2.  Antithrombotics: -DVT/anticoagulation:  Pharmaceutical: Other (comment) Eliquis             -antiplatelet therapy: N/A 3. Pain Management: Tizanidine no longer ordered, Voltaren gel 4 times daily, Fioricet as needed, oxycodone as needed 4. Mood/sleep:   melatonin 5 mg daily as needed             -antipsychotic agents: Zyprexa               -continue sleep chart  1/23 continue seroquel 100mg  qhs  - sleep chart    - continue am dose of seroquel 25mg  5. Neuropsych: This patient is not capable of making decisions on his own behalf.  -still requires soft wrist restraints for safety/equipiment--continue--review safety plan with team  6. Skin/Wound Care: Routine skin checks 7. Fluids/Electrolytes/Nutrition: Routine in and outs with follow-up chemistries             -continue current TF  -protein supplementation thru peg for low albumin  Continue foley for PEG as long as it continues to function  -now on bolus feeds   -check cmet/cbc tomorrow 8.  Prolonged ventilatory support.  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     Decannulated 1/16---occlusive dressing to trach stoma--almost closed 9.  Dysphagia.  Status post gastrostomy tube 12/13/2021  per Dr.Lovick. -continue NPO, trials with SLP 10.  Aspiration pneumonia.  Antibiotic therapy completed.  -afebrile  -hypoxic 1/16, cxr reviewed and unremarkable--sats in 90's now  -can utilize O2 as needed  -maximize time oob 11.  New onset atrial fibrillation.  Started Eliquis 12/17/2021.  Marland Kitchen Continue low-dose beta-blocker.  1/22 HR controlled Monitor on current meds Vitals:   01/06/22 2026 01/07/22 0603  BP: 118/89 126/68  Pulse: 75 68  Resp: 18 18  Temp: 99.2 F (37.3  C) 98.2 F (36.8 C)  SpO2: 99% 100%    12.  Hypertension.  Norvasc 10 mg daily, Cozaar 50 mg daily, , clonidine 0.1 mg every 8 hours.     -1/23  bp fair control  13.  Hyperlipidemia.  Lipitor 14.  History of BPH/urinary retention.        -generally has been voiding but is incontinent 15.  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  Follow-up per cardiology services. 16. Blood clots in stool:    -resolved  -may recur given he's on eliquis  -pt emptying well, just incontinently   LOS: 13 days A FACE TO FACE EVALUATION WAS PERFORMED  Meredith Staggers 01/07/2022, 11:40 AM

## 2022-01-07 NOTE — Progress Notes (Signed)
Occupational Therapy Session Note  Patient Details  Name: [name] MRN: [id number] Date of Birth: Oct 23, 1946  Today's Date: 01/07/2022 OT Individual Time: 8260-8883 OT Individual Time Calculation (min): 55 min    Short Term Goals: Week 2:  OT Short Term Goal 1 (Week 2): Pt will coordinate LB dressing with no more than mod A OT Short Term Goal 2 (Week 2): Pt will demonstrate intellectual awareness with mod cueing OT Short Term Goal 3 (Week 2): Pt will complete toilet transfer consistently with mod A OT Short Term Goal 4 (Week 2): Pt will require no more than mod cueing for full body bathing initiation and sequencing  Skilled Therapeutic Interventions/Progress Updates:    Care coordination with nursing staff to determine cause of fall yesterday. Pt left in recliner and should only be in TIS w/c at this time. Pt supine with no c/o pain, asking to go to the bathroom. Pt oriented only to self. He was impulsive and confused throughout session but overall did better with sequencing and motor planning. He completed an ambulatory transfer into the bathroom with mod +2 assist for safety d/t truncal ataxia. He voided urine- which was very cloudy- LPN alerted. He required min A for standing balance support when completing peri hygiene and donning new brief. BP assessed and was WNL. He completed UB bathing/dressing at the sink with mod cueing but only CGA! He donned new pants with mod A, improved threading of RLE into pant leg. Oral hygiene completed with min A for thoroughness- improved appearance of tongue hygiene. He completed 2 trials of 50 ft of functional mobility with mod +2, fading to +1 assist. He was left sitting in the TIS w/c with all needs met, LPN administering medication and bolus feed. B wrist restraints on and safety alarm belt.   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

## 2022-01-08 LAB — GLUCOSE, CAPILLARY
Glucose-Capillary: 124 mg/dL — ABNORMAL HIGH (ref 70–99)
Glucose-Capillary: 148 mg/dL — ABNORMAL HIGH (ref 70–99)
Glucose-Capillary: 156 mg/dL — ABNORMAL HIGH (ref 70–99)
Glucose-Capillary: 79 mg/dL (ref 70–99)
Glucose-Capillary: 84 mg/dL (ref 70–99)
Glucose-Capillary: 93 mg/dL (ref 70–99)

## 2022-01-08 LAB — COMPREHENSIVE METABOLIC PANEL
ALT: 22 U/L (ref 0–44)
AST: 17 U/L (ref 15–41)
Albumin: 2.6 g/dL — ABNORMAL LOW (ref 3.5–5.0)
Alkaline Phosphatase: 103 U/L (ref 38–126)
Anion gap: 10 (ref 5–15)
BUN: 16 mg/dL (ref 8–23)
CO2: 26 mmol/L (ref 22–32)
Calcium: 8.5 mg/dL — ABNORMAL LOW (ref 8.9–10.3)
Chloride: 102 mmol/L (ref 98–111)
Creatinine, Ser: 0.88 mg/dL (ref 0.61–1.24)
GFR, Estimated: >60 mL/min (ref >60)
Glucose, Bld: 95 mg/dL (ref 70–99)
Potassium: 4.2 mmol/L (ref 3.5–5.1)
Sodium: 138 mmol/L (ref 135–145)
Total Bilirubin: 0.5 mg/dL (ref 0.3–1.2)
Total Protein: 6 g/dL — ABNORMAL LOW (ref 6.5–8.1)

## 2022-01-08 LAB — CBC
HCT: 39 % (ref 39.0–52.0)
Hemoglobin: 13.2 g/dL (ref 13.0–17.0)
MCH: 30.9 pg (ref 26.0–34.0)
MCHC: 33.8 g/dL (ref 30.0–36.0)
MCV: 91.3 fL (ref 80.0–100.0)
Platelets: 253 10*3/uL (ref 150–400)
RBC: 4.27 MIL/uL (ref 4.22–5.81)
RDW: 15.3 % (ref 11.5–15.5)
WBC: 4.4 10*3/uL (ref 4.0–10.5)
nRBC: 0 % (ref 0.0–0.2)

## 2022-01-08 MED ORDER — QUETIAPINE FUMARATE 50 MG PO TABS
50.0000 mg | ORAL_TABLET | Freq: Every day | ORAL | Status: DC
  Administered 2022-01-09 – 2022-01-31 (×23): 50 mg
  Filled 2022-01-08 (×23): qty 1

## 2022-01-08 MED ORDER — CITALOPRAM HYDROBROMIDE 10 MG PO TABS
10.0000 mg | ORAL_TABLET | Freq: Every day | ORAL | Status: DC
  Administered 2022-01-08 – 2022-01-27 (×20): 10 mg
  Filled 2022-01-08 (×20): qty 1

## 2022-01-08 MED ORDER — QUETIAPINE FUMARATE 50 MG PO TABS
50.0000 mg | ORAL_TABLET | Freq: Two times a day (BID) | ORAL | Status: DC | PRN
  Administered 2022-01-09 – 2022-01-27 (×9): 50 mg
  Filled 2022-01-08 (×11): qty 1

## 2022-01-08 NOTE — Progress Notes (Signed)
Patient had hematuria in urinal.  Marzella Schlein NP notified. No new orders at this time, patient has new afib dx. Per E. Lovena Le NP ntify oncoming nurse if hematuria continues or starts to become bright red to call NP back.

## 2022-01-08 NOTE — Patient Care Conference (Addendum)
Inpatient RehabilitationTeam Conference and Plan of Care Update Date: 01/08/2022   Time: 1000 AM    Patient Name: [name]      Medical Record Number: [id number]  Date of Birth: 07/23/1946 Sex: Male         Room/Bed: 4W16C/4W16C-01 Payor Info: Payor: MEDICARE / Plan: MEDICARE PART A AND B / Product Type: *No Product type* /    Admit Date/Time:  12/25/2021  3:05 PM  Primary Diagnosis:  Cerebellar infarction [facility])  Hospital Problems: Principal Problem:   Cerebellar infarction [facility])    Expected Discharge Date: Expected Discharge Date: 01/23/22  Team Members Present: Physician leading conference: Dr. Alger Simons Social Worker Present: [name], [place] Nurse Present: Dorien Chihuahua, RN PT Present: Apolinar Junes, PT OT Present: Laverle Hobby, OT SLP Present: Weston Anna, SLP PPS Coordinator present : Gunnar Fusi, SLP     Current Status/Progress Goal Weekly Team Focus  Bowel/Bladder   incontinent of b/b due to urgency; LBM: 01/21  gain regular bowel pattern  assist with toileting needs prn   Swallow/Nutrition/ Hydration   NPO with PEG, trials with SLP  Min A  Use of multiple swallows, ongoing trials with SLP to asses readiness for repeat MBS   ADL's   CGA UB ADLs, mod A LB ADLs, mod A transfers. Occasional moments of clarity but overall still very confused and impulsive  Supervision to CGA overall  Motor planning/sequencing, ADL retraining, transfers, endurance   Mobility   modA bed mobility, modA sit to stand, +2 standing balance, modA +2 HHA gait up to 140', very ataxic  CGA  safety awareness, balance, ambulation, coordination   Communication   Min-Mod A  Supervision  use of speech intelligibility strategies   Safety/Cognition/ Behavioral Observations  Max A  Min A  orientation, sustained attention, overall safety, intellectual awareness, behavior management   Pain   no c/o pain  remain pain free  assess pain QS and prn   Skin   Peg tube site LUQ;  old trach site healing  remain free of new skin breakdown/infection  assess skin QS and prn     Discharge Planning:  Pt to d/c to home with his wife who works from home and has flexibility with her employer to provide 24/7 care that pt will need.   Team Discussion: Remains confused but more alert, MD adjusted meds for mood and sleep, pt laryngeal residue with aspiration with trials of puree, working on awareness and following directions.  Function impaired by ataxia and impulsivity. Patient on target to meet rehab goals: yes, Contact guard with ADL's and Mod assist for LB ADL's.  Able to ambulate up to 75 ft with min/mod assist. Goals for discharge are set for supervision overall.  *See Care Plan and progress notes for long and short-term goals.   Revisions to Treatment Plan:  ABS assessment added; MBS next week.  Teaching Needs: Safety, toiling, transfers, medication management, etc  Current Barriers to Discharge: Decreased caregiver support  Possible Resolutions to Barriers: Family education     Medical Summary Current Status: more agitation at times, showing some improvement in awareness. perseverative at times. still npo, sleep is fair. now on bolus feeds  Barriers to Discharge: Medical stability;Behavior   Possible Resolutions to [employer]: behavioral mgt, daily assessment of labs, pt data, nutrition   Continued Need for Acute Rehabilitation Level of Care: The patient requires daily medical management by a physician with specialized training in physical medicine and rehabilitation for the following reasons: Direction  of a multidisciplinary physical rehabilitation program to maximize functional independence : Yes Medical management of patient stability for increased activity during participation in an intensive rehabilitation regime.: Yes Analysis of laboratory values and/or radiology reports with any subsequent need for medication adjustment and/or medical  intervention. : Yes   I attest that I was present, lead the team conference, and concur with the assessment and plan of the team.   Ernest Pine 01/08/2022, 1:46 PM

## 2022-01-08 NOTE — Progress Notes (Signed)
Received a call from [name],  Reporting [name] was having a small amount hematuria. Dr Naaman Plummer note was reviewed, the above was discussed with Dr Ranell Patrick, we will continue to monitor.

## 2022-01-08 NOTE — Progress Notes (Addendum)
PROGRESS NOTE   Subjective/Complaints: Wound up this morning. Upset that he can't find his cell phone and that someone else has it. "I'm going home tomorrow".   ROS: Limited due to cognitive/behavioral     Objective:   No results found. Recent Labs    01/08/22 0549  WBC 4.4  HGB 13.2  HCT 39.0  PLT 253    Recent Labs    01/08/22 0549  NA 138  K 4.2  CL 102  CO2 26  GLUCOSE 95  BUN 16  CREATININE 0.88  CALCIUM 8.5*     No intake or output data in the 24 hours ending 01/08/22 0916          Physical Exam: Vital Signs Blood pressure 116/68, pulse 62, temperature 98.5 F (36.9 C), temperature source Oral, resp. rate 14, height 5\' 10"  (1.778 m), weight 87.9 kg, SpO2 93 %.  Constitutional: No distress . Vital signs reviewed. HEENT: NCAT, EOMI, oral membranes moist Neck: trach stoma closed Cardiovascular: RRR without murmur. No JVD    Respiratory/Chest: CTA Bilaterally without wheezes or rales. Normal effort    GI/Abdomen: BS +, non-tender, non-distended, foley intact Ext: no clubbing, cyanosis, or edema Psych: restless, irritable and perseverative  Skin: No evidence of breakdown, no evidence of rash. Surgical scar CDI Neuro:  alert, distracted. Oriented to [place] and "hospital", self. Follows simple commands but poor memory, insight and awareness otherwise. Moves all 4's but inconsistent. Has wrist restraints.   No resting tone. Sensory exam grossly intact Musculoskeletal: cerivcal tenderness. present     Assessment/Plan: 1. Functional deficits which require 3+ hours per day of interdisciplinary therapy in a comprehensive inpatient rehab setting. Physiatrist is providing close team supervision and 24 hour management of active medical problems listed below. Physiatrist and rehab team continue to assess barriers to discharge/monitor patient progress toward functional and medical goals  Care  Tool:  Bathing    Body parts bathed by patient: Right arm, Left arm, Chest, Abdomen, Face   Body parts bathed by helper: Front perineal area, Buttocks, Right upper leg, Left upper leg, Right lower leg, Left lower leg     Bathing assist Assist Level: Maximal Assistance - Patient 24 - 49%     Upper Body Dressing/Undressing Upper body dressing   What is the patient wearing?: Pull over shirt    Upper body assist Assist Level: Minimal Assistance - Patient > 75%    Lower Body Dressing/Undressing Lower body dressing      What is the patient wearing?: Pants, Incontinence brief     Lower body assist Assist for lower body dressing: Maximal Assistance - Patient 25 - 49%     Toileting Toileting    Toileting assist Assist for toileting: 2 Helpers     Transfers Chair/bed transfer  Transfers assist     Chair/bed transfer assist level:  (stand pivot)     Locomotion Ambulation   Ambulation assist   Ambulation activity did not occur: Safety/medical concerns  Assist level: 2 helpers (+2 mod A) Assistive device: Walker-rolling Max distance: 19ft   Walk 10 feet activity   Assist  Walk 10 feet activity did not occur: Safety/medical concerns  Walk 50 feet activity   Assist Walk 50 feet with 2 turns activity did not occur: Safety/medical concerns         Walk 150 feet activity   Assist Walk 150 feet activity did not occur: Safety/medical concerns         Walk 10 feet on uneven surface  activity   Assist Walk 10 feet on uneven surfaces activity did not occur: Safety/medical concerns         Wheelchair     Assist Is the patient using a wheelchair?: Yes Type of Wheelchair: Manual Wheelchair activity did not occur: Safety/medical concerns (limited by cognitive deficits)         Wheelchair 50 feet with 2 turns activity    Assist    Wheelchair 50 feet with 2 turns activity did not occur: Safety/medical concerns        Wheelchair 150 feet activity     Assist  Wheelchair 150 feet activity did not occur: Safety/medical concerns       Blood pressure 116/68, pulse 62, temperature 98.5 F (36.9 C), temperature source Oral, resp. rate 14, height 5\' 10"  (1.778 m), weight 87.9 kg, SpO2 93 %.    Medical Problem List and Plan: 1. 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  Status post posterior decompression with craniectomy 11/26/2021             -patient may not yet shower             -ELOS/Goals: 18-22 days, supervision to min assist goals  -Continue CIR therapies including PT, OT, and SLP. Interdisciplinary team conference today to discuss goals, barriers to discharge, and dc planning.   -recent HCT without acute changes   2.  Antithrombotics: -DVT/anticoagulation:  Pharmaceutical: Other (comment) Eliquis             -antiplatelet therapy: N/A 3. Pain Management: Tizanidine no longer ordered, Voltaren gel 4 times daily, Fioricet as needed, oxycodone as needed 4. Mood/sleep:   melatonin 5 mg daily as needed             -antipsychotic agents: seroquel              -continue sleep chart  1/24 continue seroquel 100mg  qhs  - sleep chart    - increase am dose of seroquel to 50 mg  -add HS celexa for sleep and mood stabilization  -behavior plan discussed in team conf today. Would like to keep abs 5. Neuropsych: This patient is not capable of making decisions on his own behalf.  -still requires soft wrist restraints for safety/equipiment--continue--review safety plan with team  6. Skin/Wound Care: Routine skin checks 7. Fluids/Electrolytes/Nutrition:   Continue foley for PEG as long as it continues to function  -now on bolus feeds   -I personally reviewed the patient's labs today.   -WNL except for albumin although a little better   -continue protein supplementation 8.  Prolonged ventilatory support.  Status post  tracheostomy 12/10/2021 per Dr.Chand.     Decannulated 1/16---occlusive dressing to trach stoma-  closed 9.  Dysphagia.  Status post gastrostomy tube 12/13/2021 per Dr.Lovick. -continue NPO, trials with SLP 10.  Aspiration pneumonia.  Antibiotic therapy completed.  -afebrile  -hypoxic 1/16, cxr reviewed and unremarkable--sats in 90's now  -can utilize O2 as needed  -maximize time oob 11.  New onset atrial fibrillation.  Started Eliquis 12/17/2021.  Marland Kitchen Continue low-dose beta-blocker.  1/24 HR controlled Monitor on  current meds Vitals:   01/07/22 2019 01/08/22 0416  BP: 120/65 116/68  Pulse: 73 62  Resp: 16 14  Temp: 98.6 F (37 C) 98.5 F (36.9 C)  SpO2: 97% 93%    12.  Hypertension.  Norvasc 10 mg daily, Cozaar 50 mg daily, , clonidine 0.1 mg every 8 hours.     -1/24 bp controlled 13.  Hyperlipidemia.  Lipitor 14.  History of BPH/urinary retention.        -generally has been voiding but is incontinent 15.  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  Follow-up per cardiology services. 16. Blood clots in stool:    -resolved  -may recur given he's on eliquis  -pt emptying well, just incontinently   LOS: 14 days A FACE TO FACE EVALUATION WAS PERFORMED  Meredith Staggers 01/08/2022, 9:16 AM

## 2022-01-08 NOTE — Progress Notes (Signed)
Speech Language Pathology Daily Session Note  Patient Details  Name: [name] MRN: [id number] Date of Birth: 09/27/1946  Today's Date: 01/08/2022 SLP Individual Time: 1430-1500 SLP Individual Time Calculation (min): 30 min  Short Term Goals: Week 2: SLP Short Term Goal 1 (Week 2): Patient will sustain attention to functional tasks for 3 minutes with Mod verbal cues for redirection. SLP Short Term Goal 2 (Week 2): Patient will demonstrate orientaton to place, time and situation with Mod verbal and visual cues. SLP Short Term Goal 3 (Week 2): Patient will consume trials of thin liquids without overt s/s of aspiration in 75% of trials and Mod verbal cues for use of swallowing compensatory strategies. SLP Short Term Goal 4 (Week 2): Patient will utilize a second swallow after trials of liquids in 50% of opportunities with Max verbal cues.  Skilled Therapeutic Interventions: Skilled treatment session focused on cognitive and dysphagia goals. Upon arrival, patient was awake in bed and appeared pleasant. Patient performed oral care via the suction toothbrush with set-up assist. Patient consumed trials of ice chips with intermittent and delayed coughing observed. However, no overt s/s of aspiration observed with nectar-thick liquids via straw. Throughout trails, patient required Min-Mod verbal cues for use of multiple swallows. Recommend ongoing trials with SLP. Patient mildly verbose throughout session but demonstrating increased awareness regarding current hospitalization and deficits with continued language of confusion noted. Patient left upright in bed with alarm on, restraints in place and all needs within reach. Continue with current plan of care.      Pain No/Denies Pain   Therapy/Group: Individual Therapy  [name] 01/08/2022, 3:24 PM

## 2022-01-08 NOTE — Progress Notes (Signed)
Patient ID: [name], male   DOB: 11/12/1946, 76 y.o.   MRN: [id number]  SW provided updates to pt and pt son [name]. Pt son [name] aware SW will follow-up with his wife [name].   *SW spoke with [name] to provide updates from team conference, and d/c date remains 2/8. SW discussed family education. She intends to be here tomorrow, however, will work on scheduling a time next week after speaking with her son [name].   [name], MSW, [place] Office: [phone] Cell: [phone] Fax: [phone]

## 2022-01-08 NOTE — Progress Notes (Signed)
Occupational Therapy Session Note  Patient Details  Name: [name] MRN: [id number] Date of Birth: [birth year]  Today's Date: 01/08/2022 OT Individual Time: 0900-1000 OT Individual Time Calculation (min): 60 min    Short Term Goals: Week 2:  OT Short Term Goal 1 (Week 2): Pt will coordinate LB dressing with no more than mod A OT Short Term Goal 2 (Week 2): Pt will demonstrate intellectual awareness with mod cueing OT Short Term Goal 3 (Week 2): Pt will complete toilet transfer consistently with mod A OT Short Term Goal 4 (Week 2): Pt will require no more than mod cueing for full body bathing initiation and sequencing  Skilled Therapeutic Interventions/Progress Updates:    Pt received supine with no c/o pain, agreeable to OT session and shower! Son [name] present. He completed bed mobility with min A to EOB. Functional mobility to the bathroom with the RW with mod +2 assist, ataxia present in trunk and limbs. Shower completed seated on bariatric BSC. He demonstrated improved motor planning with bathing, significant limb ataxia present as he washed UB and LB, requiring only min A for reaching to his bottom. Did keep on knee high teds for BP support throughout shower- no c/o dizziness. Pt transferred back to the TIS w/c with mod A. He donned a shirt and used deodorant with (S). Min A to don pants- great improvement in motor planning and reaching to LE to coordinate threading into pants! Pt completed two trials of 75 ft of functional mobility with the RW, mod A+2 for the first trial, and he progressed to min-mod A of only 1 assist for the second trial. He was left sitting up in the TIS w/c with his son present, all needs met. One wrist restraint on and alarm belt on. All vitals stable throughout session.   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

## 2022-01-08 NOTE — Progress Notes (Signed)
Patient anxious and agitated this AM. Yelling , and cursing at staff. Patient son [name] arrived patient behaviors decreased with son at bedside. Able to take 1 side of wrist restraint off at a time, rotated every 30 minutes throughout the day when son was at the bedside. Patient had small amount of hematuria in brief this evening. Will recheck in 2 hours for any additional hematuria. Sister at bedside. Medications given via peg tube, no residual noted. Bed in low position, tele sitter, and bilateral wrist restraints for patient safety.

## 2022-01-08 NOTE — Progress Notes (Signed)
Physical Therapy Session Note  Patient Details  Name: [name] MRN: [id number] Date of Birth: November 09, 1946  Today's Date: 01/08/2022 PT Individual Time: 1032-1130 and 1300-1350 PT Individual Time Calculation (min): 58 min and 50 min  Short Term Goals: Week 2:  PT Short Term Goal 1 (Week 2): Pt will perform supine<>sit with mod assist consistently PT Short Term Goal 2 (Week 2): Pt will perform sit<>stand with mod assist of 1 consistently PT Short Term Goal 3 (Week 2): Pt will perform bed<>chair transfers with mod assist of 1 consistently PT Short Term Goal 4 (Week 2): Pt will ambulate at least 77ft using LRAD with mod assist of 1 and +2 min assist if needed  Skilled Therapeutic Interventions/Progress Updates:     Session 1: Patient in TIS w/c in the room with his son upon PT arrival. Patient alert and agreeable to PT session. Patient denied pain during session.  Patient with intermittent language of confusion, overall appropriate verbal expression >75% of the time. Tolerated a quiet room and moderately stimulating gym setting in the day room without behaviors or agitation. Patient recalled feeling agitated this morning about being "stuck in the bed." Patient appropriately apologized for his behavior to staff he encountered during session.  Therapeutic Activity: Bed Mobility: Patient performed sit to supine with supervision in a flat bed with min use of bed rail. Provided verbal cues for initiation and removing shoes before lying down. Transfers: Patient performed sit to/from stand x3 with min progressing to CGA using RW, squat pivot w/c<>mat table with min A due to poor motor planning and safety awareness, and stand pivot w/c>bed with CGA using RW. Provided verbal cues for forward weight shift, hand placement, safety awareness, and sequencing.  Gait Training:  Patient ambulated 50 feet with 2x180 deg turns x3 using RW with mod A progressing to min with intermittent mod A for balance and  AD management. Ambulated with variable foot placement, intermittent scissoring, narrow BOS, decreased L weight shift, decreased R step length and foot clearance, R posterior pelvic rotation, and unsafe use of RW due to trunkle ataxia (improved with PT putting weight into the RW to maintain RW on the ground). Provided verbal cues for putting weight into the RW for increased stability, increased L weight shift and leading with R heel for improved foot clearance, and increased BOS with external focus. Demonstrated gait deviations between trial with patient identifying deviations and how to correct them with min cues.   Neuromuscular Re-ed: Patient performed the following motor control activities: -sti to stand 3x5 with mirror for visual feedback progressing from min A to CGA focused on forward weight shift and shifting weight into his toes for reduced posterior bias and controlled movements for improved postural control -alternating step taps with min A +2 HHA on 8" step x6 before sudden onset of dizziness and external distraction of tying his pants Vitals: BP 117/56, HR 72, SPO2 99%  Patient in bed with his son at bedside at end of session with breaks locked, Telesitter in place, bed alarm set, and all needs within reach. RN, Kennyth Lose, cleared family to closely observe patient without soft wrist restraints donned at this time. Patient calm and in agreement.   Educated patient on purpose of PEG tube and Telesitter for safety during session. Patient stated understanding and agreeable to safety protocol. Requesting to work on eating "Tyler food." Will need frequent reinforcement of safety protocol due to cognitive deficits.   ABS: 19  Session 2: Patient in bed asleep  with his son at bedside upon PT arrival. Patient slow to arouse and agreeable to PT session. Patient denied pain during session.  Patient with increased language of confusion this afternoon with tangential speech and decreased frustration  tolerance/mild agitation. Patient impulsive and restless when preparing to leave the room and when out of the room, improved when patient agreeable to return to the room for tube feed. Patient was informed by his son that his sister would be coming at end of PT session, patient perseverative on eating lunch with his sister, patient unable to be redirected with max cues and only agreeable to looking for his sister or waiting in the room for his sister. Spent increased time using therapeutic use of self and adjustments to environment to manage behaviors throughout session. RN reports patient missed 12pm tube feed and agreeable to start tube feed in attempts to improve patient's mood/affect.   Educated patient on tube feed and dysphasia with some recall of the reason for the PEG tube. Patient oriented to place and self, reported, "my head," when asked about situation, and disoriented to time, stated the year was "2073."  Therapeutic Activity: Bed Mobility: Patient performed supine to/from sit with CGA-supervision with x3 attempts to sit up from a flat bed without use of bed rail due to poor motor planning without the bed rail initially. Provided verbal cues for progressing through side-lying to push to sit up for reduced effort and posterior bias. Transfers: Patient performed stand pivot using RW with mod A for AD management/safety and balance due to lifting the RW off the floor during transfer. He performed sit to/from stand x1 with B HHA and stand/squat pivot w/c<>mat table and w/c>bed with CGA-min A. Provided multimodal cues for sequencing, hand placement, management of impulsivity/safety awareness, and controlled movements.  Gait Training:  Patient ambulated 102 feet using B HHA with min A +2 for improved trunk support and reduced distraction without AD. Ambulated as described above with cues as above for repetition and improved recall.   Patient in bed with RN provided tube feed at end of session with  breaks locked, bed alarm set, Telesitter in place, and R soft wrist restraint secured per RN request, and all needs within reach. Patient missed 10 min of skilled PT due to nursing care/agitation, RN made aware. Will attempt to make-up missed time as able.    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

## 2022-01-09 LAB — URINALYSIS, COMPLETE (UACMP) WITH MICROSCOPIC
RBC / HPF: >50 RBC/hpf (ref 0–5)
Squamous Epithelial / HPF: NONE SEEN (ref 0–5)
WBC, UA: >50 WBC/hpf (ref 0–5)

## 2022-01-09 LAB — GLUCOSE, CAPILLARY
Glucose-Capillary: 115 mg/dL — ABNORMAL HIGH (ref 70–99)
Glucose-Capillary: 125 mg/dL — ABNORMAL HIGH (ref 70–99)
Glucose-Capillary: 135 mg/dL — ABNORMAL HIGH (ref 70–99)
Glucose-Capillary: 145 mg/dL — ABNORMAL HIGH (ref 70–99)
Glucose-Capillary: 173 mg/dL — ABNORMAL HIGH (ref 70–99)
Glucose-Capillary: 98 mg/dL (ref 70–99)

## 2022-01-09 MED ORDER — OSMOLITE 1.5 CAL PO LIQD
356.0000 mL | Freq: Four times a day (QID) | ORAL | Status: DC
  Administered 2022-01-09 – 2022-01-10 (×4): 356 mL
  Filled 2022-01-09: qty 1000
  Filled 2022-01-09: qty 474

## 2022-01-09 NOTE — Progress Notes (Signed)
PROGRESS NOTE   Subjective/Complaints: Pt with gross hematuria overnight and in briefs this am. OT reports urine malodorous as well. Pt denies any penile pain or pain with urination. Pt actually in good spirits this morning. Nurse feels that when he's hungry he is more irritable. His last bolus is 2000 and the next is not until 0800.   ROS: Limited due to cognitive/behavioral    Objective:   No results found. Recent Labs    01/08/22 0549  WBC 4.4  HGB 13.2  HCT 39.0  PLT 253    Recent Labs    01/08/22 0549  NA 138  K 4.2  CL 102  CO2 26  GLUCOSE 95  BUN 16  CREATININE 0.88  CALCIUM 8.5*      Intake/Output Summary (Last 24 hours) at 01/09/2022 0818 Last data filed at 01/09/2022 0039 Gross per 24 hour  Intake --  Output 600 ml  Net -600 ml            Physical Exam: Vital Signs Blood pressure (!) 104/59, pulse 60, temperature 97.9 F (36.6 C), temperature source Oral, resp. rate 14, height 5\' 10"  (1.778 m), weight 87.2 kg, SpO2 94 %.  Constitutional: No distress . Vital signs reviewed. HEENT: NCAT, EOMI, oral membranes moist Neck: supple Cardiovascular: RRR without murmur. No JVD    Respiratory/Chest: CTA Bilaterally without wheezes or rales. Normal effort    GI/Abdomen: BS +, non-tender, non-distended Ext: no clubbing, cyanosis, or edema Psych: pleasant but distracted Skin: No evidence of breakdown, no evidence of rash. Surgical scar CDI Neuro:  alert, distracted. Oriented to [place] and "hospital", self essentially.  Follows simple commands but still poor memory, insight and awareness otherwise. Moves all 4's but inconsistent. Has wrist restraints.   No resting tone. Sensory exam grossly intact Musculoskeletal: no jt tenderness appreciated    Assessment/Plan: 1. Functional deficits which require 3+ hours per day of interdisciplinary therapy in a comprehensive inpatient rehab  setting. Physiatrist is providing close team supervision and 24 hour management of active medical problems listed below. Physiatrist and rehab team continue to assess barriers to discharge/monitor patient progress toward functional and medical goals  Care Tool:  Bathing    Body parts bathed by patient: Right arm, Left arm, Chest, Abdomen, Face   Body parts bathed by helper: Front perineal area, Buttocks, Right upper leg, Left upper leg, Right lower leg, Left lower leg     Bathing assist Assist Level: Maximal Assistance - Patient 24 - 49%     Upper Body Dressing/Undressing Upper body dressing   What is the patient wearing?: Pull over shirt    Upper body assist Assist Level: Minimal Assistance - Patient > 75%    Lower Body Dressing/Undressing Lower body dressing      What is the patient wearing?: Pants, Incontinence brief     Lower body assist Assist for lower body dressing: Maximal Assistance - Patient 25 - 49%     Toileting Toileting    Toileting assist Assist for toileting: 2 Helpers     Transfers Chair/bed transfer  Transfers assist     Chair/bed transfer assist level:  (stand pivot)     Locomotion  Ambulation   Ambulation assist   Ambulation activity did not occur: Safety/medical concerns  Assist level: 2 helpers (+2 mod A) Assistive device: Walker-rolling Max distance: 41ft   Walk 10 feet activity   Assist  Walk 10 feet activity did not occur: Safety/medical concerns        Walk 50 feet activity   Assist Walk 50 feet with 2 turns activity did not occur: Safety/medical concerns         Walk 150 feet activity   Assist Walk 150 feet activity did not occur: Safety/medical concerns         Walk 10 feet on uneven surface  activity   Assist Walk 10 feet on uneven surfaces activity did not occur: Safety/medical concerns         Wheelchair     Assist Is the patient using a wheelchair?: Yes Type of Wheelchair:  Manual Wheelchair activity did not occur: Safety/medical concerns (limited by cognitive deficits)         Wheelchair 50 feet with 2 turns activity    Assist    Wheelchair 50 feet with 2 turns activity did not occur: Safety/medical concerns       Wheelchair 150 feet activity     Assist  Wheelchair 150 feet activity did not occur: Safety/medical concerns       Blood pressure (!) 104/59, pulse 60, temperature 97.9 F (36.6 C), temperature source Oral, resp. rate 14, height 5\' 10"  (1.778 m), weight 87.2 kg, SpO2 94 %.    Medical Problem List and Plan: 1. 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  Status post posterior decompression with craniectomy 11/26/2021             -patient may not yet shower             -ELOS/Goals: 18-22 days, supervision to min assist goals  --Continue CIR therapies including PT, OT, and SLP  -recent HCT without acute changes   2.  Antithrombotics: -DVT/anticoagulation:  Pharmaceutical: Other (comment) Eliquis             -antiplatelet therapy: N/A 3. Pain Management: Tizanidine no longer ordered, Voltaren gel 4 times daily, Fioricet as needed, oxycodone as needed 4. Mood/sleep:   melatonin 5 mg daily as needed             -antipsychotic agents: seroquel              -continue sleep chart  1/24 continue seroquel 100mg  qhs  - sleep chart    - increase am dose of seroquel to 50 mg  -added HS celexa for sleep and mood stabilization  -1/25 behavior plan discussed team. ABS 19-28 yesterday 5. Neuropsych: This patient is not capable of making decisions on his own behalf.  -still requires soft wrist restraints for safety/equipiment-  6. Skin/Wound Care: Routine skin checks 7. Fluids/Electrolytes/Nutrition:   Continue foley for PEG as long as it continues to function  -now on bolus feeds   1/25-will change timing of last bolus to 2100 and first to 0600 to help  with AM hunger pangs.  8.  Prolonged ventilatory support.  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     Decannulated 1/16---occlusive dressing to trach stoma-  closed 9.  Dysphagia.  Status post gastrostomy tube 12/13/2021 per Dr.Lovick. -continue NPO, trials with SLP. Repeat MBS per SLP 10.  Aspiration pneumonia.  Antibiotic therapy completed.  -afebrile  -hypoxic 1/16, cxr reviewed and unremarkable--sats  in 90's now  -can utilize O2 as needed  -maximize time oob 11.  New onset atrial fibrillation.  Started Eliquis 12/17/2021.  Marland Kitchen Continue low-dose beta-blocker.  1/24 HR controlled Monitor on current meds Vitals:   01/08/22 1932 01/09/22 0332  BP: 122/67 (!) 104/59  Pulse: 65 60  Resp: 18 14  Temp: 98.2 F (36.8 C) 97.9 F (36.6 C)  SpO2: 100% 94%    12.  Hypertension.  Norvasc 10 mg daily, Cozaar 50 mg daily, , clonidine 0.1 mg every 8 hours.     -1/24 bp controlled 13.  Hyperlipidemia.  Lipitor 14.  History of BPH/urinary retention.        -generally has been voiding but is incontinent 15.  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  Follow-up per cardiology services. 16. Blood clots previously in stool, now in urine:    -likely d/t eliquis  -check ua, ucx given odor reported  -recent hgb stable  LOS: 15 days A FACE TO [place] 01/09/2022, 8:18 AM

## 2022-01-09 NOTE — Progress Notes (Signed)
Occupational Therapy Session Note  Patient Details  Name: [name] MRN: [id number] Date of Birth: 1946-04-02  Today's Date: 01/09/2022 OT Individual Time: 3958-4417 OT Individual Time Calculation (min): 55 min    Short Term Goals: Week 2:  OT Short Term Goal 1 (Week 2): Pt will coordinate LB dressing with no more than mod A OT Short Term Goal 2 (Week 2): Pt will demonstrate intellectual awareness with mod cueing OT Short Term Goal 3 (Week 2): Pt will complete toilet transfer consistently with mod A OT Short Term Goal 4 (Week 2): Pt will require no more than mod cueing for full body bathing initiation and sequencing  Skilled Therapeutic Interventions/Progress Updates:    Pt received supine ,bringing legs to EOB stating he needs to use the bathroom. He appropriately responded to OT directions to wait supine while items were gathered for transfer. BSC brought to EOB d/t not having a second person for safety. He completed bed mobility with CGA. Mod A stand pivot with the RW. Blood present in brief and then pt voided urine that was almost completely red with blood. RN alerted and MD rounded on pt, also seeing the hematuria. Pt completed sit > stand from the [facility] for assist with hygiene, mod A overall. He transferred to the TIS w/c with mod A for ataxic trunk control. Very sudden report of nausea, pt dry heaving for several minutes. BP assessed, initially 143/123 and then 153/97. The nausea resolved and then pt completed grooming and oral care tasks at the sink with set up assist. Pt was brought to the therapy gym where he completed standing level BITS activity- identifying numerical sequence with mod cueing for working memory and occasional perseveration. He returned to his room and was left sitting up in the w/c with all needs met, chair alarm set and B wrist restraints on.   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

## 2022-01-09 NOTE — Progress Notes (Signed)
Patient up in bed. Denies pain or discomfort at this this time, no S/S of distress. Emesis x 1 this am, Zofran PRN given, effective. Patient continues wiht bilateral wrists restraints. Able to remove restraints while family is at bedside. No behaviors noted throughout shift at this time. Safety maintained, call light within reach.

## 2022-01-09 NOTE — Progress Notes (Signed)
Physical Therapy Weekly Progress Note  Patient Details  Name: [name] MRN: [id number] Date of Birth: 21-Jun-1946  Beginning of progress report period: January 02, 2022 End of progress report period: January 09, 2022  Today's Date: 01/09/2022 PT Individual Time: 1022-1140 PT Individual Time Calculation (min): 78 min   Patient has met 4 of 4 short term goals.  Patient with steady progress this week, limited by intermittent bouts of tachycardia and intermittent behaviors with agitation, impulsivity, and disorientation, usually when first waking up. 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 Initiated family education for management of expectations and PT goals, will implement hands-on family training closer to d/c date.    Patient continues to demonstrate the following deficits decreased cardiorespiratoy endurance, impaired timing and sequencing, unbalanced muscle activation, motor apraxia, ataxia, decreased coordination, and decreased motor planning, decreased initiation, decreased attention, decreased awareness, decreased problem solving, decreased safety awareness, decreased memory, and delayed processing, and decreased sitting balance, decreased standing balance, decreased postural control, decreased balance strategies, and difficulty maintaining precautions and therefore will continue to benefit from skilled PT intervention to increase functional independence with mobility.  Patient progressing toward long term goals..  Continue plan of care.  PT Short Term Goals Week 2:  PT Short Term Goal 1 (Week 2): Pt will perform supine<>sit with mod assist consistently PT Short Term Goal 1 - Progress (Week 2): Met PT  Short Term Goal 2 (Week 2): Pt will perform sit<>stand with mod assist of 1 consistently PT Short Term Goal 2 - Progress (Week 2): Met PT Short Term Goal 3 (Week 2): Pt will perform bed<>chair transfers with mod assist of 1 consistently PT Short Term Goal 3 - Progress (Week 2): Met PT Short Term Goal 4 (Week 2): Pt will ambulate at least 71f using LRAD with mod assist of 1 and +2 min assist if needed PT Short Term Goal 4 - Progress (Week 2): Met Week 3:  PT Short Term Goal 1 (Week 3): Patient will perform basic transfers with min A consistently. PT Short Term Goal 2 (Week 3): Patient will ambulate mod-min A without overt LOB using LRAD >200 ft. PT Short Term Goal 3 (Week 3): Patient will perform 4 steps using 1 rail with min A of 1 person.  Skilled Therapeutic Interventions/Progress Updates:     Patient in w/c with his wife, TCoralyn Mark in the room upon PT arrival. Patient alert and agreeable to PT session. Patient denied pain during session.  Patient with x1 bout of dizziness during session, resolved <2 min in sitting, Vitals WFL.   Focused session on lower extremity motor control with blocked practice for transfer training and standing balance.   Therapeutic Activity: Transfers: Patient performed stand pivot using a weighted RW TIS w/c<>mat table with min A and TIS w/c<>BSC with min-mod A due to impulsivity secondary to urinary urgency. Provided verbal cues for hand placement, safe use of RW, and safety awareness. Patient was continent and incontinent of bladder during toileting. Urine noted to be dark red and cloudy in appearance, RN made aware and PA informed about hematuria by OT and RN this morning. Patient performed peri-care with set-up assist seated for anterior peri-care and  standing for posterior peri-care. Performed lower-body clothing management with min A from his wife and mod A for standing balance to bend down to reach his brief and pants.   Patient and his wife confirmed that the  patient has a history of intermittent hematuria since his treatment for prostate cancer, however, uncertain if this amount of blood that he is currently having is consistent with past occurences.   Neuromuscular Re-ed: Patient performed the following lower extremity motor control activities for improved balance and control with functional transfers using blocked practice: -sit to stand using RW focused on forward weight shift and safe hand placement x10 min A-CGA -sit to stand without AD focused on forward weight shift and reduced posterior bias with external cues 2x10 progressed from mod A to CGA -sit to stand without AD with forefoot on blue foam wedge to encourage forward weight shift into his toes to reduce posterior bias 2x10 progressed from min-mod A +2 to min A of 1 person -reciprocal ambulation >50 ft with min-mod A and +2 w/c follow using weighted RW for improved stability and safety of AD use focused on erect posture and controlled reciprocal stepping with multimodal cues   Provided seated rest breaks between activities due to decreased activity tolerance. Patient with intermittent language of confusion. Oriented to self and city, provided orientation for location, date, as patient stated June 09, 2001, and situation, as patient stated "a coronary." Patient able to name the correct day of the week.   Discussed d/c planning, PT goals, progress and expectations for d/c, and equipment needs with patient and his wife at end of session.   Patient in TIS w/c with 1 soft wrist restraint donned and his wife in the room at end of session with breaks locked, seat belt alarm set, and all needs within reach.   ABS: 19  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, truncal ataxia Restrictions Weight Bearing Restrictions: No Other Position/Activity Restrictions: Wears a lift in R shoe s/p hip  replacement on L causing leg length discrepency   Therapy/Group: Individual Therapy  Dainelle Hun L Naome Brigandi PT, DPT  01/09/2022, 4:33 PM

## 2022-01-09 NOTE — Progress Notes (Signed)
Physical Therapy Session Note  Patient Details  Name: [name] MRN: [id number] Date of Birth: 1946-12-01  Today's Date: 01/09/2022 PT Individual Time: 7654-6503 PT Individual Time Calculation (min): 40 min   Short Term Goals: Week 2:  PT Short Term Goal 1 (Week 2): Pt will perform supine<>sit with mod assist consistently PT Short Term Goal 2 (Week 2): Pt will perform sit<>stand with mod assist of 1 consistently PT Short Term Goal 3 (Week 2): Pt will perform bed<>chair transfers with mod assist of 1 consistently PT Short Term Goal 4 (Week 2): Pt will ambulate at least 64ft using LRAD with mod assist of 1 and +2 min assist if needed  Skilled Therapeutic Interventions/Progress Updates:     Pt received seated in tilt in space WC and agrees to therapy. No complaint of pain. WC transport to gym for time management. Pt performs sit to stand with minA +2 and RW with cues for hand placement and body mechanics. Session focused on gait training with a weighted RW. Pt ambulates bouts of 180', 200', 240', and 250' with modA +1 and +2 WC follow for safety. PT provides verbal and tactile cues for upright gaze to improve posture and balance, increasing proximity to RW for safety, decreasing gait speed as pt tends to ambulate very quickly, and increasing R step height and length, as pt tends to take shortened steps on R, especially with fatigue. Following, pt performs stand step transfer back to bed with maxA +1 HHA and cues for sequencing. Left supine with alarm intact and all needs within reach.  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    Therapy/Group: Individual Therapy  Breck Coons, PT, DPT 01/09/2022,  4:16 PM

## 2022-01-09 NOTE — Progress Notes (Addendum)
Patient had no behaviors between 0700 and 1830.

## 2022-01-09 NOTE — Progress Notes (Signed)
Speech Language Pathology Weekly Progress and Session Note  Patient Details  Name: [name] MRN: [id number] Date of Birth: Mar 22, 1946  Beginning of progress report period: January 02, 2022 End of progress report period: January 09, 2017  Today's Date: 01/09/2022 SLP Individual Time: 1400-1455 SLP Individual Time Calculation (min): 55 min  Short Term Goals: Week 2: SLP Short Term Goal 1 (Week 2): Patient will sustain attention to functional tasks for 3 minutes with Mod verbal cues for redirection. SLP Short Term Goal 1 - Progress (Week 2): Met SLP Short Term Goal 2 (Week 2): Patient will demonstrate orientaton to place, time and situation with Mod verbal and visual cues. SLP Short Term Goal 2 - Progress (Week 2): Met SLP Short Term Goal 3 (Week 2): Patient will consume trials of thin liquids without overt s/s of aspiration in 75% of trials and Mod verbal cues for use of swallowing compensatory strategies. SLP Short Term Goal 3 - Progress (Week 2): Not met SLP Short Term Goal 4 (Week 2): Patient will utilize a second swallow after trials of liquids in 50% of opportunities with Max verbal cues. SLP Short Term Goal 4 - Progress (Week 2): Met    New Short Term Goals: Week 3: SLP Short Term Goal 1 (Week 3): Patient will sustain attention to functional tasks for 8 minutes with Mod verbal cues for redirection. SLP Short Term Goal 2 (Week 3): Patient will demonstrate orientaton to place, time and situation with Min verbal and visual cues. SLP Short Term Goal 3 (Week 3): Patient will perform basic problem solving tasks with Max verbal cues. SLP Short Term Goal 4 (Week 3): Patient will self-monitor and correct errors during functional tasks with Max A multimodal cues. SLP Short Term Goal 5 (Week 3): Patient will consume trials of thin liquids without overt s/s of aspiration in 75% of trials and Mod verbal cues for use of swallowing compensatory strategies. SLP Short Term Goal 6 (Week 3):  Patient will utilize a second swallow after trials of liquids in 75% of opportunities with Mod verbal cues.  Weekly Progress Updates: Patient has made steady gains and has met 3 of 4 STGs this reporting period. Currently, patient demonstrates improvement in overall cognitive functioning characterized by improved sustained attention, improved orientation and improved intellectual awareness of deficits. However, patient's overall cognitive functioning can fluctuate throughout the day with and patient can be limited by intermittent verbal agitation. Patient also continues with language of contusion.Patient remains NPO but is consuming trials of both thin and nectar-thick liquids. Patient continues to demonstrate increased overt s/s of aspiration with thin liquids but demonstrates improved utilization of multiple swallows across all trials. Patient and family education ongoing. Patient would benefit from continued skilled SLP intervention to maximize his cognitive and swallowing function prior to discharge.    Intensity: Minumum of 1-2 x/day, 30 to 90 minutes Frequency: 3 to 5 out of 7 days Duration/Length of Stay: 01/23/22 Treatment/Interventions: Cognitive remediation/compensation;Internal/external aids;Speech/Language facilitation;Therapeutic Activities;Environmental controls;Cueing hierarchy;Functional tasks;Patient/family education;Dysphagia/aspiration precaution training   Daily Session  Skilled Therapeutic Interventions:  Skilled treatment session focused on dysphagia and cognitive goals. SLP facilitated session by providing set-up assist and overall Mod verbal cues for problem solving during oral care with the suction toothbrush. Patient consumed trials of ice chips with overt coughing X 2, suspect due to talking while the bolus was in his oral cavity. Patient also consumed trials of thin liquids via tsp with overt cough X 1. SLP also provided trials of nectar-thick liquids via  straw with one, delayed  coughing episode, suspect due to pharyngeal residue. Throughout trials, Mod verbal cues were needed for use of multiple swallows. Patient with intermittent language of confusion and required Max verbal and visual cues for redirection. SLP also facilitated session by providing Mod-Max A verbal and visual cues for functional problem solving during a basic money management task and for error awareness. Min verbal cues were needed for sustained attention throughout task. Patient left upright in bed with alarm on and all needs within reach. Continue with current plan of care.     Pain No/Denies Pain   Therapy/Group: Individual Therapy  [name] 01/09/2022, 6:29 AM

## 2022-01-09 NOTE — Progress Notes (Signed)
Occupational Therapy Weekly Progress Note  Patient Details  Name: [name] MRN: [id number] Date of Birth: Aug 08, 1946  Beginning of progress report period: January 02, 2022 End of progress report period: January 09, 2022   Patient has met 4 of 4 short term goals. [name] continues to make steady progress toward his OT goals. Despite ongoing ataxia his independence with ADLs has improved, requiring only CGA-(S) for UB ADLs and mod A for LB. His transfers have improved, requiring mod A for functional mobility in/out of the bathroom. Although still inconsistent, he is more able to express his needs and demonstrate some improved impulse control in waiting for those needs to be met. Family education will be scheduled closer to d/c, however his family is incredibly supportive and always present.    Patient continues to demonstrate the following deficits: muscle weakness, decreased cardiorespiratoy endurance, ataxia, decreased coordination, and decreased motor planning, ideational apraxia, decreased initiation, decreased attention, decreased awareness, decreased problem solving, decreased safety awareness, decreased memory, and delayed processing, and decreased sitting balance, decreased standing balance, decreased postural control, and decreased balance strategies and therefore will continue to benefit from skilled OT intervention to enhance overall performance with BADL.  Patient progressing toward long term goals..  Continue plan of care.  OT Short Term Goals Week 2:  OT Short Term Goal 1 (Week 2): Pt will coordinate LB dressing with no more than mod A OT Short Term Goal 1 - Progress (Week 2): Met OT Short Term Goal 2 (Week 2): Pt will demonstrate intellectual awareness with mod cueing OT Short Term Goal 2 - Progress (Week 2): Met OT Short Term Goal 3 (Week 2): Pt will complete toilet transfer consistently with mod A OT Short Term Goal 3 - Progress (Week 2): Met OT Short Term Goal 4 (Week 2): Pt  will require no more than mod cueing for full body bathing initiation and sequencing OT Short Term Goal 4 - Progress (Week 2): Met Week 3:  OT Short Term Goal 1 (Week 3): Pt will complete LB dressing with min A OT Short Term Goal 2 (Week 3): Pt will complete toilet transfers with min A OT Short Term Goal 3 (Week 3): Pt will require no more than min cueing for sustained attention to one ADL task    [name] 01/09/2022, 8:19 PM

## 2022-01-10 LAB — GLUCOSE, CAPILLARY
Glucose-Capillary: 105 mg/dL — ABNORMAL HIGH (ref 70–99)
Glucose-Capillary: 116 mg/dL — ABNORMAL HIGH (ref 70–99)
Glucose-Capillary: 151 mg/dL — ABNORMAL HIGH (ref 70–99)
Glucose-Capillary: 159 mg/dL — ABNORMAL HIGH (ref 70–99)
Glucose-Capillary: 205 mg/dL — ABNORMAL HIGH (ref 70–99)
Glucose-Capillary: 96 mg/dL (ref 70–99)

## 2022-01-10 MED ORDER — CEPHALEXIN 250 MG/5ML PO SUSR
500.0000 mg | Freq: Two times a day (BID) | ORAL | Status: AC
  Administered 2022-01-10 – 2022-01-16 (×14): 500 mg
  Filled 2022-01-10 (×16): qty 10

## 2022-01-10 MED ORDER — OSMOLITE 1.5 CAL PO LIQD
415.0000 mL | Freq: Four times a day (QID) | ORAL | Status: DC
  Administered 2022-01-10 – 2022-01-17 (×27): 415 mL
  Filled 2022-01-10 (×8): qty 1000
  Filled 2022-01-10: qty 474

## 2022-01-10 MED ORDER — FREE WATER
200.0000 mL | Freq: Four times a day (QID) | Status: DC
  Administered 2022-01-10 – 2022-01-26 (×64): 200 mL

## 2022-01-10 NOTE — Progress Notes (Addendum)
PROGRESS NOTE   Subjective/Complaints: Pt still with hematuria. Denies dysuria. No obvious odor reported. In good spirits this morning. Working with therapy  ROS: Limited due to cognitive/behavioral    Objective:   No results found. Recent Labs    01/08/22 0549  WBC 4.4  HGB 13.2  HCT 39.0  PLT 253    Recent Labs    01/08/22 0549  NA 138  K 4.2  CL 102  CO2 26  GLUCOSE 95  BUN 16  CREATININE 0.88  CALCIUM 8.5*      Intake/Output Summary (Last 24 hours) at 01/10/2022 1123 Last data filed at 01/09/2022 1130 Gross per 24 hour  Intake --  Output 300 ml  Net -300 ml            Physical Exam: Vital Signs Blood pressure 114/64, pulse (!) 58, temperature 98 F (36.7 C), temperature source Oral, resp. rate 18, height 5\' 10"  (1.778 m), weight 82 kg, SpO2 96 %.  Constitutional: No distress . Vital signs reviewed. HEENT: NCAT, EOMI, oral membranes moist Neck: supple Cardiovascular: RRR without murmur. No JVD    Respiratory/Chest: CTA Bilaterally without wheezes or rales. Normal effort    GI/Abdomen: BS +, non-tender, non-distended, foley in abdomen Ext: no clubbing, cyanosis, or edema Psych: pleasant but still confused Skin: No evidence of breakdown, no evidence of rash. Surgical scar CDI Neuro:  alert, distracted but more easily able to attend. Oriented to [place] and "hospital", self essentially.  Follows simple commands but still poor memory, insight and awareness otherwise. Moves all 4's but inconsistent. Has wrist restraints.   No resting tone. Sensory exam grossly intact Musculoskeletal: no jt tenderness appreciated    Assessment/Plan: 1. Functional deficits which require 3+ hours per day of interdisciplinary therapy in a comprehensive inpatient rehab setting. Physiatrist is providing close team supervision and 24 hour management of active medical problems listed below. Physiatrist and rehab  team continue to assess barriers to discharge/monitor patient progress toward functional and medical goals  Care Tool:  Bathing    Body parts bathed by patient: Right arm, Left arm, Chest, Abdomen, Face   Body parts bathed by helper: Front perineal area, Buttocks, Right upper leg, Left upper leg, Right lower leg, Left lower leg     Bathing assist Assist Level: Maximal Assistance - Patient 24 - 49%     Upper Body Dressing/Undressing Upper body dressing   What is the patient wearing?: Pull over shirt    Upper body assist Assist Level: Minimal Assistance - Patient > 75%    Lower Body Dressing/Undressing Lower body dressing      What is the patient wearing?: Pants, Incontinence brief     Lower body assist Assist for lower body dressing: Maximal Assistance - Patient 25 - 49%     Toileting Toileting    Toileting assist Assist for toileting: 2 Helpers     Transfers Chair/bed transfer  Transfers assist     Chair/bed transfer assist level:  (stand pivot)     Locomotion Ambulation   Ambulation assist   Ambulation activity did not occur: Safety/medical concerns  Assist level: 2 helpers (+2 mod A) Assistive device: Walker-rolling Max distance:  37ft   Walk 10 feet activity   Assist  Walk 10 feet activity did not occur: Safety/medical concerns        Walk 50 feet activity   Assist Walk 50 feet with 2 turns activity did not occur: Safety/medical concerns         Walk 150 feet activity   Assist Walk 150 feet activity did not occur: Safety/medical concerns         Walk 10 feet on uneven surface  activity   Assist Walk 10 feet on uneven surfaces activity did not occur: Safety/medical concerns         Wheelchair     Assist Is the patient using a wheelchair?: Yes Type of Wheelchair: Manual Wheelchair activity did not occur: Safety/medical concerns (limited by cognitive deficits)         Wheelchair 50 feet with 2 turns  activity    Assist    Wheelchair 50 feet with 2 turns activity did not occur: Safety/medical concerns       Wheelchair 150 feet activity     Assist  Wheelchair 150 feet activity did not occur: Safety/medical concerns       Blood pressure 114/64, pulse (!) 58, temperature 98 F (36.7 C), temperature source Oral, resp. rate 18, height 5\' 10"  (1.778 m), weight 82 kg, SpO2 96 %.    Medical Problem List and Plan: 1. 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  Status post posterior decompression with craniectomy 11/26/2021             -patient may not yet shower             -ELOS/Goals: 18-22 days, supervision to min assist goals  --Continue CIR therapies including PT, OT, and SLP  -recent HCT without acute changes   2.  Antithrombotics: -DVT/anticoagulation:  Pharmaceutical: Other (comment) Eliquis             -antiplatelet therapy: N/A 3. Pain Management: Tizanidine no longer ordered, Voltaren gel 4 times daily, Fioricet as needed, oxycodone as needed 4. Mood/sleep:   melatonin 5 mg daily as needed             -antipsychotic agents: seroquel              -continue sleep chart  1/24 continue seroquel 100mg  qhs  - sleep chart    - increase am dose of seroquel to 50 mg  -added HS celexa for sleep and mood stabilization  -1/26 pt still easily agitated. Continue abs recordings 5. Neuropsych: This patient is not capable of making decisions on his own behalf.  -still requires soft wrist restraints for safety/equipiment-  6. Skin/Wound Care: Routine skin checks 7. Fluids/Electrolytes/Nutrition:   Continue foley for PEG as long as it continues to function  -now on bolus feeds   1/25-changed timing of last bolus to 2100 and first to 0600 to help with AM hunger pangs.  8.  Prolonged ventilatory support.  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     Decannulated 1/16---occlusive dressing  to trach stoma-  closed 9.  Dysphagia.  Status post gastrostomy tube 12/13/2021 per Dr.Lovick. -continue NPO, trials with SLP. Repeat MBS per SLP 10.  Aspiration pneumonia.  Antibiotic therapy completed.  -afebrile  -hypoxic 1/16, cxr reviewed and unremarkable--sats in 90's now  -can utilize O2 as needed  -maximize time oob 11.  New onset atrial fibrillation.  Started Eliquis 12/17/2021.  Marland Kitchen Continue low-dose beta-blocker.  1/26 HR controlled Monitor on current meds Vitals:   01/09/22 1944 01/10/22 0528  BP: (!) 107/52 114/64  Pulse: 63 (!) 58  Resp: 16 18  Temp: 98.6 F (37 C) 98 F (36.7 C)  SpO2: 99% 96%    12.  Hypertension.  Norvasc 10 mg daily, Cozaar 50 mg daily, , clonidine 0.1 mg every 8 hours.     -1/26 bp controlled 13.  Hyperlipidemia.  Lipitor 14.  History of BPH/urinary retention.        -generally has been voiding but is incontinent 15.  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  Follow-up per cardiology services. 16. Blood clots previously in stool, now in urine:    -likely d/t eliquis  1/26 -UA +, ucx with 100k GNR  -began empiric keflex  -recheck hgb tomorrow  LOS: 16 days A FACE TO FACE EVALUATION WAS PERFORMED  Meredith Staggers 01/10/2022, 11:23 AM

## 2022-01-10 NOTE — Progress Notes (Signed)
Physical Therapy TBI Note  Patient Details  Name: [name] MRN: [id number] Date of Birth: 1946/10/16  Today's Date: 01/10/2022 PT Individual Time: 1000-1100 PT Individual Time Calculation (min): 60 min   Short Term Goals: Week 3:  PT Short Term Goal 1 (Week 3): Patient will perform basic transfers with min A consistently. PT Short Term Goal 2 (Week 3): Patient will ambulate mod-min A without overt LOB using LRAD >200 ft. PT Short Term Goal 3 (Week 3): Patient will perform 4 steps using 1 rail with min A of 1 person.  Skilled Therapeutic Interventions/Progress Updates:     Patient in bed with his son at bedside upon PT arrival. Patient alert and agreeable to PT session. Patient denied pain during session, reported need to use the bathroom at beginning of session.   Therapeutic Activity: Bed Mobility: Patient performed supine to sit with CGA and impulsivity with initiation due to urinary urgency. Provided verbal cues for safety awareness. Patient donned slip-on tennis shoes with set-up assist sitting EOB. Transfers: Patient performed stand pivot bed<>BSC and bed>manual w/c with min-mod A due to impulsivity using weighted RW. Patient was continent and incontinent of bladder with dar red urin with notable blood clots in urine, RN made aware, and continent of bowl during toileting. Patient performed peri-care with set-up assist in standing with mod A for standing balance with single upper extremity support. Donned incontinence brief with total A and pulled up brief and pants with min A in standing. Provided cues for safety awareness and anterior weight shift for balance throughout. He performed sit to/from stand x2 with min A-CGA using a RW. Provided verbal cues for forward weight shift and pressing toes into the floor for reduced posterior bias.  Gait Training:  Patient ambulated 227 feet and 252 feet using weighted RW with min A and +2 w/c follow for safety due to impulsivity and ataxia.  Ambulated with variable foot placement R>L, decreased L weight shift, decreased R foot clearance, increased R knee/hip flexion in stance, forward flexed posture, and downward head gaze. Provided verbal cues for erect posture, looking ahead, heel strike at initial contact on R for increased step length and foot clearance, and attention to task. Patient with x2 LOB to the R with external distraction requiring min-mod A to correct.  Wheelchair Mobility:  Patient propelled a manual wheelchair 57 feet with supervision. Provided verbal cues for use of B upper extremities simultaneously pushing together with increased stroke length for increased speed and momentum, as patient tends to perform reciprocal propulsion and short strokes.   Patient oriented to self and location, disoriented to situation and time. Provided orientation with visual aids. Patient able to recall activities performed in therapy yesterday and recalled that he ambulated >200 feet x2 during session when reporting what he did during therapy to his son.   Patient in manual w/c with his son in the room at end of session with breaks locked, seat belt alarm set, 1 soft wrist restraint secured per safety plan, and all needs within reach. RN made aware that patient is performing trial of sitting in the manual w/c with his son in the room and wrist restraint and safety belt alarm in place at end of session.   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 Agitated  Behavior Scale: TBI Observation Details Observation Environment: patient's room Start of observation period - Date: 01/10/22 Start of observation period - Time: 1000 End of observation period - Date:  01/10/22 End of observation period - Time: 1100 Agitated Behavior Scale (DO NOT LEAVE BLANKS) Short attention span, easy distractibility, inability to concentrate: Present to a slight degree Impulsive, impatient, low tolerance for pain or frustration: Present to a slight degree Uncooperative, resistant to care, demanding: Absent Violent and/or threatening violence toward people or property: Absent Explosive and/or unpredictable anger: Absent Rocking, rubbing, moaning, or other self-stimulating behavior: Absent Pulling at tubes, restraints, etc.: Absent Wandering from treatment areas: Absent Restlessness, pacing, excessive movement: Absent Repetitive behaviors, motor, and/or verbal: Absent Rapid, loud, or excessive talking: Absent Sudden changes of mood: Absent Easily initiated or excessive crying and/or laughter: Absent Self-abusiveness, physical and/or verbal: Absent Agitated behavior scale total score: 16    Therapy/Group: Individual Therapy  Soley Harriss L Deah Ottaway PT, DPT  01/10/2022, 5:11 PM

## 2022-01-10 NOTE — Progress Notes (Signed)
Speech Language Pathology Daily Session Note  Patient Details  Name: [name] MRN: [id number] Date of Birth: March 22, 1946  Today's Date: 01/10/2022 SLP Individual Time: 1325-1400 SLP Individual Time Calculation (min): 35 min and Today's Date: 01/10/2022 SLP Missed Time: 25 Minutes Missed Time Reason: Pain;Patient fatigue  Short Term Goals: Week 3: SLP Short Term Goal 1 (Week 3): Patient will sustain attention to functional tasks for 8 minutes with Mod verbal cues for redirection. SLP Short Term Goal 2 (Week 3): Patient will demonstrate orientaton to place, time and situation with Min verbal and visual cues. SLP Short Term Goal 3 (Week 3): Patient will perform basic problem solving tasks with Max verbal cues. SLP Short Term Goal 4 (Week 3): Patient will self-monitor and correct errors during functional tasks with Max A multimodal cues. SLP Short Term Goal 5 (Week 3): Patient will consume trials of thin liquids without overt s/s of aspiration in 75% of trials and Mod verbal cues for use of swallowing compensatory strategies. SLP Short Term Goal 6 (Week 3): Patient will utilize a second swallow after trials of liquids in 75% of opportunities with Mod verbal cues.  Skilled Therapeutic Interventions: Skilled treatment session focused on dysphagia and cognitive goals. Initially, patient was asleep in bed. Patient was slow to rouse and reported a headache. Patient unable to rate but declined medication and requested SLP come back tomorrow. RN aware and administrated medications and SLP re-attempted 20 minutes later.  Patient was awake and alert upon arrival with 2nd attempt and agreeable to participate in treatment session. Patient performed oral care via the suction toothbrush after set-up assist. Patient was lethargic throughout session with decreased and prolonged AP transit with trials of ice chips resulting in consistent overt coughing episodes. Max A multimodal cues were also needed for use of  multiple swallows. Patient's son [name]) was present and provided education regarding importance of oral care prior to ice chip administration, proper arousal/postioning and s/s of aspiration. [name] verbalized understanding, therefore, [name] is signed off to provide the patient with 3-4 ice chips every few hours for pleasure and to continue to work on strengthening and use of swallowing compensatory strategies. RN aware. Patient left upright in bed with alarm on and all needs within reach. Continue with current plan of care.      Pain Pain Assessment Pain Scale: Faces Faces Pain Scale: Hurts even more Pain Type: Acute pain Pain Location: Head Pain Descriptors / Indicators: Headache Pain Frequency: Intermittent Pain Intervention(s): Medication (See eMAR)  Therapy/Group: Individual Therapy  [name] 01/10/2022, 3:07 PM

## 2022-01-10 NOTE — Progress Notes (Signed)
Patient had 2 episodes of incontinence with hematuria.

## 2022-01-10 NOTE — Progress Notes (Signed)
Nutrition Follow-up  DOCUMENTATION CODES:  Not applicable  INTERVENTION:  Continue Osmolite 1.5 cal formula via PEG. Increase volume to ~415 ml (1.75 cartons/ARCs, 7 cartons total per day) QID  45 ml Prosource TF BID per tube.  free water flushes of 200 ml q 6 hours per tube.  Tube feeding regimen at goal to provide 2569 kcal, 126 grams protein, 2064 ml free water. Monitor progress towards oral diet to determine if changes are needed to TF regimen.   NUTRITION DIAGNOSIS:  Inadequate oral intake related to inability to eat as evidenced by NPO status; ongoing  GOAL:  Patient will meet greater than or equal to 90% of their needs; met with TF  MONITOR:  Labs, Weight trends, TF tolerance, Skin, I & O's  REASON FOR ASSESSMENT:  Consult Enteral/tube feeding initiation and management  ASSESSMENT:  76 year old male with history of CAD status post CABG x4 2012, hyperlipidemia, nephrolithiasis, BPH/prostate cancer. Presented 11/25/2021 with altered mental status as well as dizziness with ataxic gait and diaphoretic. 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 Pt underwent suboccipital decompressive craniectomy 11/26/2021. Pt intubated for prolonged time underwent tracheostomy 12/10/2021 and PEG tube placement 12/13/2021. Pt with decreased functional mobility and admitted to CIR.   PEG placed prior to CIR admission 12/13/21 Trach decannulated 1/16.  Pt remains NPO at this time, making some progress with SLP therapy but continues to receive all nutrition via PEG.  Pt sleeping at the time of visit, family at bedside. TF have been tolerated well so far but noted in MD note that pt seems to be getting hungry with the long stretch between boluses overnight. MD adjusted timing of feeds. Reviewed weight appears that pt has lost 6 lbs since admission to CIR but weights tend to fluctuate from day to day. Given hunger and weight loss, will increase amount of TF being administered.  Admit  weight: 84.6 kg (1/10) Current Weight: 82 kg (1/26)  Nutritionally Relevant Medications: Scheduled Meds:  apixaban  5 mg Per Tube BID   atorvastatin  80 mg Per Tube Daily   OSMOLITE 1.5 CAL  356 mL Per Tube QID   PROSource TF  45 mL Per Tube BID   fiber  1 packet Per Tube BID   free water  250 mL Per Tube QID   pantoprazole sodium  40 mg Per Tube Daily   PRN Meds: docusate, ondansetron, polyethylene glycol  Labs Reviewed: SBG ranges from 96-173 mg/dL over the last 24 hours  Diet Order:   Diet Order             Diet NPO time specified  Diet effective midnight                   EDUCATION NEEDS:  Not appropriate for education at this time  Skin:  Skin Assessment: Reviewed RN Assessment Skin Integrity Issues:: Incisions Incisions: head  Last BM:  1/24  Height:  Ht Readings from Last 1 Encounters:  12/25/21 _0  (1.778 m)   Weight:  Wt Readings from Last 1 Encounters:  01/10/22 82 kg   BMI:  Body mass index is 25.94 kg/m.  Estimated Nutritional Needs:  Kcal:  2200-2400 kcal/d Protein:  115-125 grams Fluid:  >/= 2 L/day   Ranell Patrick, RD, LDN Clinical Dietitian RD pager # available in [place]  After hours/weekend pager # available in [facility]

## 2022-01-10 NOTE — Progress Notes (Signed)
Occupational Therapy TBI Note  Patient Details  Name: [name] MRN: [id number] Date of Birth: [birth year]  Today's Date: 01/10/2022 OT Individual Time: 3235-5732 session 1 OT Individual Time Calculation (min): 55 min  Session 2: 1130-1200 ( 30 mins)   Short Term Goals: Week 2:  OT Short Term Goal 1 (Week 2): Pt will coordinate LB dressing with no more than mod A OT Short Term Goal 1 - Progress (Week 2): Met OT Short Term Goal 2 (Week 2): Pt will demonstrate intellectual awareness with mod cueing OT Short Term Goal 2 - Progress (Week 2): Met OT Short Term Goal 3 (Week 2): Pt will complete toilet transfer consistently with mod A OT Short Term Goal 3 - Progress (Week 2): Met OT Short Term Goal 4 (Week 2): Pt will require no more than mod cueing for full body bathing initiation and sequencing OT Short Term Goal 4 - Progress (Week 2): Met  Skilled Therapeutic Interventions/Progress Updates:  Session 1: Pt received asleep in bed but easily able to arouse and  agreeable to OT intervention. Session focus on BADL reeducation, functional mobility, dynamic standing balance and decreasing overall caregiver burden.  Pt alerted and oriented to name but thinks we are in a motel even with external cues pt insistent on being in motel. Pt completed supine>sit with MIN A to elevate trunk and MIN cues for sequencing task. Pt completed sit<>stand with MIN A +2 and able to pivot to [facility] with RW and MIN A +2 presenting with truncal ataxia. Pt with blood in urine, RN alerted and MD entered to observe output. Pt completed bathing tasks from [facility] with overall CGA for safety. Pt donned/doffed shirt with supervision. MAX A for LB dressing of new brief and pants. Pt completed stand pivot from BSC>TIS with RW and MIN A+2. Pt completed grooming tasks at sink with supervision. Pt transported to gym with total A where pt completed dynamic balance task where pt instructed to reach out of BOS to retrieve horseshoes from top of  mirror and hand to RT on pts L side. Pt completed task with overall MIN A for balance support. Pt completed x2 trials needing seated rest break after each trial d/t fatigue. Pt perseverates on topics such as tarheel basketball but overall pleasantly confused. Pt mildly impulsive but with directions provided about next steps pt able to wait appropriately and follow directions.  Pt transported back to room with total A where pt completed stand pivot back to EOB with MIN A +2 with Rw. CGA for sit>lying. pt left supine in bed with HOB >30*, restraints in place and all needs within reach.                    Session 2: pt greeted with NT present assisting pt back to bed, pt stood at stedy with CGA. Pt transported back to bed but noted to actively be having BM. Pt transported to [facility] with total A, pt needed total A +2 for clean up and posterior pericare as pt voiding B+B during transfer ( urine continues to be bloody with pt presenting with loose stool). Pt mildly impulsive during toileting but following commands with increased time. Pt completed stand pivot back to bed with Rw and MODA, sit>supine with CGA. Pt left supine in bed with bed alarm activated,  restraints in place and all needs within reach.   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  Pain: session 1: no pain reported during session  Session 2: no pain reported during session    Agitated Behavior Scale: TBI Observation Details Observation Environment: CIR Start of observation period - Date: 01/10/22 Start of observation period - Time: 0800 End of observation period - Date: 01/10/22 End of observation period - Time: 0855 Agitated Behavior Scale (DO NOT  LEAVE BLANKS) Short attention span, easy distractibility, inability to concentrate: Present to a moderate degree Impulsive, impatient, low tolerance for pain or frustration: Present to a slight degree Uncooperative, resistant to care, demanding: Absent Violent and/or threatening violence toward people or property: Absent Explosive and/or unpredictable anger: Absent Rocking, rubbing, moaning, or other self-stimulating behavior: Absent Pulling at tubes, restraints, etc.: Absent Wandering from treatment areas: Absent Restlessness, pacing, excessive movement: Absent Repetitive behaviors, motor, and/or verbal: Absent Rapid, loud, or excessive talking: Present to a slight degree Sudden changes of mood: Present to a slight degree Easily initiated or excessive crying and/or laughter: Absent Self-abusiveness, physical and/or verbal: Absent Agitated behavior scale total score: 19     Therapy/Group: Individual Therapy  Precious Haws 01/10/2022, 8:57 AM

## 2022-01-11 LAB — CBC
HCT: 35.3 % — ABNORMAL LOW (ref 39.0–52.0)
Hemoglobin: 12 g/dL — ABNORMAL LOW (ref 13.0–17.0)
MCH: 31.2 pg (ref 26.0–34.0)
MCHC: 34 g/dL (ref 30.0–36.0)
MCV: 91.7 fL (ref 80.0–100.0)
Platelets: 247 10*3/uL (ref 150–400)
RBC: 3.85 MIL/uL — ABNORMAL LOW (ref 4.22–5.81)
RDW: 15 % (ref 11.5–15.5)
WBC: 4.9 10*3/uL (ref 4.0–10.5)
nRBC: 0 % (ref 0.0–0.2)

## 2022-01-11 LAB — BASIC METABOLIC PANEL
Anion gap: 8 (ref 5–15)
BUN: 16 mg/dL (ref 8–23)
CO2: 27 mmol/L (ref 22–32)
Calcium: 8.5 mg/dL — ABNORMAL LOW (ref 8.9–10.3)
Chloride: 103 mmol/L (ref 98–111)
Creatinine, Ser: 0.86 mg/dL (ref 0.61–1.24)
GFR, Estimated: >60 mL/min (ref >60)
Glucose, Bld: 91 mg/dL (ref 70–99)
Potassium: 4.1 mmol/L (ref 3.5–5.1)
Sodium: 138 mmol/L (ref 135–145)

## 2022-01-11 LAB — URINE CULTURE: Culture: >=100000 — AB

## 2022-01-11 LAB — GLUCOSE, CAPILLARY
Glucose-Capillary: 85 mg/dL (ref 70–99)
Glucose-Capillary: 96 mg/dL (ref 70–99)
Glucose-Capillary: 151 mg/dL — ABNORMAL HIGH (ref 70–99)
Glucose-Capillary: 126 mg/dL — ABNORMAL HIGH (ref 70–99)
Glucose-Capillary: 124 mg/dL — ABNORMAL HIGH (ref 70–99)

## 2022-01-11 NOTE — Progress Notes (Signed)
Speech Language Pathology Daily Session Note  Patient Details  Name: [name] MRN: [id number] Date of Birth: Jan 09, 1946  Today's Date: 01/11/2022 SLP Individual Time: 4982-6415 SLP Individual Time Calculation (min): 30 min and Today's Date: 01/11/2022 SLP Missed Time: 15 Minutes Missed Time Reason: Patient fatigue  Short Term Goals: Week 3: SLP Short Term Goal 1 (Week 3): Patient will sustain attention to functional tasks for 8 minutes with Mod verbal cues for redirection. SLP Short Term Goal 2 (Week 3): Patient will demonstrate orientaton to place, time and situation with Min verbal and visual cues. SLP Short Term Goal 3 (Week 3): Patient will perform basic problem solving tasks with Max verbal cues. SLP Short Term Goal 4 (Week 3): Patient will self-monitor and correct errors during functional tasks with Max A multimodal cues. SLP Short Term Goal 5 (Week 3): Patient will consume trials of thin liquids without overt s/s of aspiration in 75% of trials and Mod verbal cues for use of swallowing compensatory strategies. SLP Short Term Goal 6 (Week 3): Patient will utilize a second swallow after trials of liquids in 75% of opportunities with Mod verbal cues.  Skilled Therapeutic Interventions: Skilled treatment session focused on cognitive goals. SLP facilitated session supervision level verbal cues for problem solving during oral care via the suction toothbrush. Patient consumed trials of ice chips (smaller sized today) with overt coughing in 10% of trials. Patient utilized multiple swallows with overall Min-Mod verbal cues. Patient then consumed trials of nectar-thick liquids with immediate coughing noted. Patient declined further trials and requested to end session early due to fatigue. Patient with language of confusion and intermittent hallucinations throughout session. Patient left semi-reclined in bed with alarm on and all needs within reach. Continue with current plan of care.       Pain No/Denies Pain   Therapy/Group: Individual Therapy  Windi Toro 01/11/2022, 3:46 PM

## 2022-01-11 NOTE — Progress Notes (Signed)
Physical Therapy TBI Note  Patient Details  Name: [name] MRN: [id number] Date of Birth: 04-Jun-1946  Today's Date: 01/11/2022 PT Individual Time: 1749-4496 PT Individual Time Calculation (min): 53 min   Short Term Goals: Week 3:  PT Short Term Goal 1 (Week 3): Patient will perform basic transfers with min A consistently. PT Short Term Goal 2 (Week 3): Patient will ambulate mod-min A without overt LOB using LRAD >200 ft. PT Short Term Goal 3 (Week 3): Patient will perform 4 steps using 1 rail with min A of 1 person.  Skilled Therapeutic Interventions/Progress Updates:     Pt received seated on toilet and agrees to therapy. Handoff from RN. No complaint of pain. Pt attempts to stand without assistance and requires cueing to remain seated for safety. Pt performs sit to stand from toilet with minA but requires modA for stability in standing due to ataxia. PT assists with pericare and pt able to use bilateral upper extremities to pull up pants. Pt stands to sink with minA and washes hands with minimal cueing for sequencing. Seated rest break prior to mobility. Pt performs ambulation with weighted RW. Pt ambulates x365' with minA/modA, primarily due to pt ambulating very quickly and pushing RW too far ahead of body. Pt is able to correct with multimodal cues but reverts to previous gait deviations quickly. PT also cues to increase R stride length and step height as pt fatigues. Following seated rest break, pt ambulates additional x200' with same assist and cues, with slightly improved safety and body mechanics. Stands step transfer to bed with modA and no AD. Left supine with wrist restraints and alarm intact, all needs within reach.  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, truncal ataxia Restrictions Weight Bearing Restrictions: No Other Position/Activity Restrictions: Wears a lift  in R shoe s/p hip replacement on L causing leg length discrepency  Agitated Behavior Scale: TBI Observation Details Observation Environment: CIR Start of observation period - Date: 01/11/22 Start of observation period - Time: 0930 End of observation period - Date: 01/11/22 End of observation period - Time: 1045 Agitated Behavior Scale (DO NOT LEAVE BLANKS) Short attention span, easy distractibility, inability to concentrate: Present to a slight degree Impulsive, impatient, low tolerance for pain or frustration: Absent Uncooperative, resistant to care, demanding: Absent Violent and/or threatening violence toward people or property: Absent Explosive and/or unpredictable anger: Absent Rocking, rubbing, moaning, or other self-stimulating behavior: Absent Pulling at tubes, restraints, etc.: Absent Wandering from treatment areas: Absent Restlessness, pacing, excessive movement: Absent Repetitive behaviors, motor, and/or verbal: Absent Rapid, loud, or excessive talking: Present to a slight degree Sudden changes of mood: Absent Easily initiated or excessive crying and/or laughter: Absent Self-abusiveness, physical and/or verbal: Absent Agitated behavior scale total score: 16   Therapy/Group: Individual Therapy  Breck Coons, PT, DPT 01/11/2022, 3:52 PM

## 2022-01-11 NOTE — Progress Notes (Signed)
Occupational Therapy Session Note  Patient Details  Name: [name] MRN: [id number] Date of Birth: [birth year]  Today's Date: 01/11/2022 OT Individual Time: 2505-3976 OT Individual Time Calculation (min): 70 min    Short Term Goals: Week 3:  OT Short Term Goal 1 (Week 3): Pt will complete LB dressing with min A OT Short Term Goal 2 (Week 3): Pt will complete toilet transfers with min A OT Short Term Goal 3 (Week 3): Pt will require no more than min cueing for sustained attention to one ADL task  Skilled Therapeutic Interventions/Progress Updates:    Pt received supine with no c/o pain, agreeable to OT session. Son [name] in room and supportive. Pt confused, oriented only to self. He completed an ambulatory transfer to the bathroom with min A using the RW, +2 for safety. Pt still with significant truncal ataxia but much better at self regulating/correcting. He voided urine, still with copious amount of blood and clots. Pt was able to complete LB dressing with min A. Pt completed shaving task at the sink with min A for thoroughness, using electric razor. Pt reporting more intermittent dizziness/fatigue this session and required more rest breaks. HgB at 12. Pt was taken to the ADL apt where he completed functional reaching into cabinets to challenge UE proprioceptive awareness, coordination, and hand prehension. Pt reported increased dizziness and requested to return to his room to use the bathroom. He completed 100 ft of functional mobility with min A using the RW. Once in room he declined need to urinate and stated he just wanted to get back to bed. Worked further on functional reaching from bed level and problem solved with son [name] on ways to assist pt in these areas not in therapy. He was provided with a theraband. Pt was passed off to SLP in room.    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

## 2022-01-11 NOTE — Progress Notes (Signed)
Nurse tech called for help as patient broke through his right wrist restraint and was attempting to break off his left restraint. He was very agitated, aggressive and combative. Took four personnel to get him properly restrained again safely. Two finger spaces between wrist restraints. Patient denies pain or discomfort. Keeps repeating "the dog bite me". Nurse will continue to monitor.

## 2022-01-11 NOTE — Progress Notes (Signed)
Nurse entered room and found patient trying to get out of bed with wife's assistance. Restraints were not on and he was at the edge of the bed attempting to ambulate to restroom. Patient was extremely agitated and aggressive towards wife and nurse. Nurse called for 2 more personnel  to assist with care as patient was noncompliant with nurse or his spouse. He demanded both nurse and his wife to leave the restroom. He refused to allow nurse to assist with his ADL's. It took 3 nurses to get him back into bed with his restraints. Verbal cooing from his spouse was not constructive as he did not comply with her requests and seemed to be confused at times as to who his spouse was. His gait is extremely weak and he leaped forward with his rolling walker. Therapy should reevaluate his means of ambulation as he was pulling forward too quickly with unsafe body mechanics. Also he does not allow assistance and wants to do it on his own which puts him at a higher fall risk. Nurse recommends restraints be followed per MD order. Family should not be removing restraints just because they are present as he is non-compliant with family as well as staff.

## 2022-01-11 NOTE — Progress Notes (Signed)
PROGRESS NOTE   Subjective/Complaints: Pt comfortable. Had a pretty good night by sleep chart. Son in room. Reports that he's had previous issues with hematuria related to his prostate cancer and nephrolithiasis.   ROS: Limited due to cognitive/behavioral    Objective:   No results found. Recent Labs    01/11/22 0517  WBC 4.9  HGB 12.0*  HCT 35.3*  PLT 247    Recent Labs    01/11/22 0517  NA 138  K 4.1  CL 103  CO2 27  GLUCOSE 91  BUN 16  CREATININE 0.86  CALCIUM 8.5*      Intake/Output Summary (Last 24 hours) at 01/11/2022 1036 Last data filed at 01/11/2022 0428 Gross per 24 hour  Intake --  Output 350 ml  Net -350 ml            Physical Exam: Vital Signs Blood pressure 121/71, pulse 68, temperature 98.8 F (37.1 C), temperature source Oral, resp. rate 18, height 5\' 10"  (1.778 m), weight 84.8 kg, SpO2 97 %.  Constitutional: No distress . Vital signs reviewed. HEENT: NCAT, EOMI, oral membranes moist Neck: supple, stoma closed Cardiovascular: RRR without murmur. No JVD    Respiratory/Chest: CTA Bilaterally without wheezes or rales. Normal effort    GI/Abdomen: BS +, non-tender, non-distended, foley intact Ext: no clubbing, cyanosis, or edema Psych: pleasant and cooperative  Skin: No evidence of breakdown, no evidence of rash. Surgical scar CDI Neuro:  remains alert. More attentive. Oriented to person, [place], hospital. Kept referring to the fact that someone was going to take one of his legs or amrs. Moves all 4's but inconsistent. Has wrist restraints.   No resting tone. Sensory exam grossly intact. Cerebellar exam dificult  Musculoskeletal: no jt tenderness appreciated    Assessment/Plan: 1. Functional deficits which require 3+ hours per day of interdisciplinary therapy in a comprehensive inpatient rehab setting. Physiatrist is providing close team supervision and 24 hour management of  active medical problems listed below. Physiatrist and rehab team continue to assess barriers to discharge/monitor patient progress toward functional and medical goals  Care Tool:  Bathing    Body parts bathed by patient: Right arm, Left arm, Chest, Abdomen, Face   Body parts bathed by helper: Right arm, Left arm, Chest, Abdomen     Bathing assist Assist Level: Contact Guard/Touching assist     Upper Body Dressing/Undressing Upper body dressing   What is the patient wearing?: Pull over shirt    Upper body assist Assist Level: Supervision/Verbal cueing    Lower Body Dressing/Undressing Lower body dressing      What is the patient wearing?: Pants, Incontinence brief     Lower body assist Assist for lower body dressing: Maximal Assistance - Patient 25 - 49%     Toileting Toileting    Toileting assist Assist for toileting: 2 Helpers     Transfers Chair/bed transfer  Transfers assist     Chair/bed transfer assist level: Minimal Assistance - Patient > 75% Chair/bed transfer assistive device: Programmer, multimedia   Ambulation assist   Ambulation activity did not occur: Safety/medical concerns  Assist level: Minimal Assistance - Patient > 75% Assistive device:  Walker-rolling Max distance: 252 ft   Walk 10 feet activity   Assist  Walk 10 feet activity did not occur: Safety/medical concerns  Assist level: Minimal Assistance - Patient > 75% Assistive device: Walker-rolling   Walk 50 feet activity   Assist Walk 50 feet with 2 turns activity did not occur: Safety/medical concerns  Assist level: Minimal Assistance - Patient > 75% Assistive device: Walker-rolling    Walk 150 feet activity   Assist Walk 150 feet activity did not occur: Safety/medical concerns  Assist level: Minimal Assistance - Patient > 75% Assistive device: Walker-rolling    Walk 10 feet on uneven surface  activity   Assist Walk 10 feet on uneven surfaces activity  did not occur: Safety/medical concerns         Wheelchair     Assist Is the patient using a wheelchair?: Yes Type of Wheelchair: Manual Wheelchair activity did not occur: Safety/medical concerns (limited by cognitive deficits)  Wheelchair assist level: Supervision/Verbal cueing Max wheelchair distance: 57 ft    Wheelchair 50 feet with 2 turns activity    Assist    Wheelchair 50 feet with 2 turns activity did not occur: Safety/medical concerns   Assist Level: Supervision/Verbal cueing   Wheelchair 150 feet activity     Assist  Wheelchair 150 feet activity did not occur: Safety/medical concerns       Blood pressure 121/71, pulse 68, temperature 98.8 F (37.1 C), temperature source Oral, resp. rate 18, height 5\' 10"  (1.778 m), weight 84.8 kg, SpO2 97 %.    Medical Problem List and Plan: 1. 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  Status post posterior decompression with craniectomy 11/26/2021             -patient may not yet shower             -ELOS/Goals: 18-22 days, supervision to min assist goals  -Continue CIR therapies including PT, OT, and SLP   -recent HCT without acute changes   2.  Antithrombotics: -DVT/anticoagulation:  Pharmaceutical: Other (comment) Eliquis             -antiplatelet therapy: N/A 3. Pain Management: Tizanidine no longer ordered, Voltaren gel 4 times daily, Fioricet as needed, oxycodone as needed 4. Mood/sleep:   melatonin 5 mg daily as needed             -antipsychotic agents: seroquel              -continue sleep chart  1/24 continue seroquel 100mg  qhs  - sleep chart    - increase am dose of seroquel to 50 mg  -added HS celexa for sleep and mood stabilization  -1/27 abs scores better yesterday--continue current plan, rx uti 5. Neuropsych: This patient is not capable of making decisions on his own behalf.  -still requires soft wrist  restraints for safety/equipiment-  6. Skin/Wound Care: Routine skin checks 7. Fluids/Electrolytes/Nutrition:   Continue foley for PEG as long as it continues to function  -now on bolus feeds   1/27 bolus schedule adjusted, RD added further volume  8.  Prolonged ventilatory support.  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     Decannulated 1/16---occlusive dressing to trach stoma-  closed 9.  Dysphagia.  Status post gastrostomy tube 12/13/2021 per Dr.Lovick. -continue NPO, trials with SLP. Repeat MBS per SLP 10.  Aspiration pneumonia.  Antibiotic therapy completed.  -afebrile  -oob, oxygenation improved 11.  New onset atrial fibrillation.  Started Eliquis 12/17/2021.  Marland Kitchen Continue low-dose beta-blocker.  1/27 HR controlled Vitals:   01/11/22 0423 01/11/22 0835  BP: 105/64 121/71  Pulse: (!) 58 68  Resp: 18   Temp: 98.8 F (37.1 C)   SpO2: 97%     12.  Hypertension.  Norvasc 10 mg daily, Cozaar 50 mg daily, , clonidine 0.1 mg every 8 hours.     -1/27 bp controlled 13.  Hyperlipidemia.  Lipitor 14.  History of BPH/urinary retention.        -generally has been voiding but is incontinent 15.  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  Follow-up per cardiology services. 16. Hematuria related to prostate cancer. Has extensive hx per son:    -continue eliquis  1/27 100k E coli in urine. Sensitive to keflex--continue 7 days  -hgb down to 12 today---recheck Monday unless bleeding becomes more severe  LOS: 17 days A FACE TO FACE EVALUATION WAS PERFORMED  Meredith Staggers 01/11/2022, 10:36 AM

## 2022-01-11 NOTE — Progress Notes (Signed)
Patient ID: Martin Belling, male   DOB: [birth year], 76 y.o.   MRN: [id number]  SW returned phone call to pt wife [name]  [phone]) to discuss family education. Confirmed next week Tues/Thurs 1pm-4pm with pt wife and son Rob.   [name], MSW, [place] Office: [phone] Cell: [phone] Fax: [phone]

## 2022-01-12 LAB — GLUCOSE, CAPILLARY
Glucose-Capillary: 102 mg/dL — ABNORMAL HIGH (ref 70–99)
Glucose-Capillary: 103 mg/dL — ABNORMAL HIGH (ref 70–99)
Glucose-Capillary: 105 mg/dL — ABNORMAL HIGH (ref 70–99)
Glucose-Capillary: 108 mg/dL — ABNORMAL HIGH (ref 70–99)
Glucose-Capillary: 115 mg/dL — ABNORMAL HIGH (ref 70–99)
Glucose-Capillary: 123 mg/dL — ABNORMAL HIGH (ref 70–99)

## 2022-01-12 MED ORDER — QUETIAPINE FUMARATE 50 MG PO TABS
150.0000 mg | ORAL_TABLET | Freq: Every day | ORAL | Status: DC
  Administered 2022-01-12 – 2022-01-30 (×19): 150 mg
  Filled 2022-01-12 (×19): qty 3

## 2022-01-12 NOTE — Progress Notes (Signed)
PROGRESS NOTE   Subjective/Complaints: After a pretty good 36 hours, pt became very agitated late yesterday and again this morning. Wife in room this morning. Spoke to her about his status, active issues.   ROS: Limited due to cognitive/behavioral    Objective:   No results found. Recent Labs    01/11/22 0517  WBC 4.9  HGB 12.0*  HCT 35.3*  PLT 247    Recent Labs    01/11/22 0517  NA 138  K 4.1  CL 103  CO2 27  GLUCOSE 91  BUN 16  CREATININE 0.86  CALCIUM 8.5*      Intake/Output Summary (Last 24 hours) at 01/12/2022 1245 Last data filed at 01/12/2022 0606 Gross per 24 hour  Intake --  Output 200 ml  Net -200 ml            Physical Exam: Vital Signs Blood pressure 121/60, pulse 65, temperature 98.5 F (36.9 C), temperature source Oral, resp. rate 18, height 5\' 10"  (1.778 m), weight 85.3 kg, SpO2 96 %.  Constitutional: No distress . Vital signs reviewed. HEENT: NCAT, EOMI, oral membranes moist Neck: supple Cardiovascular: RRR     Respiratory/Chest:  . Normal effort    GI/Abdomen: BS +, non-tender, non-distended Ext: no clubbing, cyanosis, or edema Psych: pt asleep this am Skin: No evidence of breakdown, no evidence of rash. Surgical scar CDI Neuro:  in wrist restraints. Somnolent this morning.   No resting tone. Sensory exam grossly intact. Cerebellar exam dificult  Musculoskeletal: no jt tenderness appreciated    Assessment/Plan: 1. Functional deficits which require 3+ hours per day of interdisciplinary therapy in a comprehensive inpatient rehab setting. Physiatrist is providing close team supervision and 24 hour management of active medical problems listed below. Physiatrist and rehab team continue to assess barriers to discharge/monitor patient progress toward functional and medical goals  Care Tool:  Bathing    Body parts bathed by patient: Right arm, Left arm, Chest, Abdomen, Face    Body parts bathed by helper: Right arm, Left arm, Chest, Abdomen     Bathing assist Assist Level: Contact Guard/Touching assist     Upper Body Dressing/Undressing Upper body dressing   What is the patient wearing?: Pull over shirt    Upper body assist Assist Level: Supervision/Verbal cueing    Lower Body Dressing/Undressing Lower body dressing      What is the patient wearing?: Pants, Incontinence brief     Lower body assist Assist for lower body dressing: Maximal Assistance - Patient 25 - 49%     Toileting Toileting    Toileting assist Assist for toileting: 2 Helpers     Transfers Chair/bed transfer  Transfers assist     Chair/bed transfer assist level: Minimal Assistance - Patient > 75% Chair/bed transfer assistive device: Programmer, multimedia   Ambulation assist   Ambulation activity did not occur: Safety/medical concerns  Assist level: Minimal Assistance - Patient > 75% Assistive device: Walker-rolling Max distance: 252 ft   Walk 10 feet activity   Assist  Walk 10 feet activity did not occur: Safety/medical concerns  Assist level: Minimal Assistance - Patient > 75% Assistive device: Walker-rolling  Walk 50 feet activity   Assist Walk 50 feet with 2 turns activity did not occur: Safety/medical concerns  Assist level: Minimal Assistance - Patient > 75% Assistive device: Walker-rolling    Walk 150 feet activity   Assist Walk 150 feet activity did not occur: Safety/medical concerns  Assist level: Minimal Assistance - Patient > 75% Assistive device: Walker-rolling    Walk 10 feet on uneven surface  activity   Assist Walk 10 feet on uneven surfaces activity did not occur: Safety/medical concerns         Wheelchair     Assist Is the patient using a wheelchair?: Yes Type of Wheelchair: Manual Wheelchair activity did not occur: Safety/medical concerns (limited by cognitive deficits)  Wheelchair assist level:  Supervision/Verbal cueing Max wheelchair distance: 57 ft    Wheelchair 50 feet with 2 turns activity    Assist    Wheelchair 50 feet with 2 turns activity did not occur: Safety/medical concerns   Assist Level: Supervision/Verbal cueing   Wheelchair 150 feet activity     Assist  Wheelchair 150 feet activity did not occur: Safety/medical concerns       Blood pressure 121/60, pulse 65, temperature 98.5 F (36.9 C), temperature source Oral, resp. rate 18, height 5\' 10"  (1.778 m), weight 85.3 kg, SpO2 96 %.    Medical Problem List and Plan: 1. 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  Status post posterior decompression with craniectomy 11/26/2021             -patient may not yet shower             -ELOS/Goals: 18-22 days, supervision to min assist goals  -Continue CIR therapies including PT, OT, and SLP   -recent HCT without acute changes -spoke to wife about his neurological status. She has concerns about managing him at home especially if he's agitatd 2.  Antithrombotics: -DVT/anticoagulation:  Pharmaceutical: Other (comment) Eliquis             -antiplatelet therapy: N/A 3. Pain Management: Tizanidine no longer ordered, Voltaren gel 4 times daily, Fioricet as needed, oxycodone as needed 4. Mood/sleep:   melatonin 5 mg daily as needed             -antipsychotic agents: seroquel              -continue sleep chart  1/24 continue seroquel 100mg  qhs  - sleep chart    - increase am dose of seroquel to 50 mg  -added HS celexa for sleep and mood stabilization  -1/28 increase agitation in last 12 hours--generally has been improving. No standing changes. Rx UTI 5. Neuropsych: This patient is not capable of making decisions on his own behalf.  -still requires soft wrist restraints for safety/equipiment-  6. Skin/Wound Care: Routine skin checks 7. Fluids/Electrolytes/Nutrition:   Continue  foley for PEG as long as it continues to function  -now on bolus feeds   1/27 bolus schedule adjusted, RD added further volume  8.  Prolonged ventilatory support.  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     Decannulated 1/16---occlusive dressing to trach stoma-  closed 9.  Dysphagia.  Status post gastrostomy tube 12/13/2021 per Dr.Lovick. -continue NPO, trials with SLP. Repeat MBS per SLP this coming week 10.  Aspiration pneumonia.  Antibiotic therapy completed.  -afebrile  -oob, oxygenation improved 11.  New onset atrial fibrillation.  Started Eliquis 12/17/2021.  Marland Kitchen Continue low-dose beta-blocker.  1/28 HR  controlled Vitals:   01/12/22 0201 01/12/22 0600  BP: 104/61 121/60  Pulse: (!) 53 65  Resp: 14 18  Temp: 98.2 F (36.8 C) 98.5 F (36.9 C)  SpO2: 97% 96%    12.  Hypertension.  Norvasc 10 mg daily, Cozaar 50 mg daily, , clonidine 0.1 mg every 8 hours.     -1/27 bp controlled 13.  Hyperlipidemia.  Lipitor 14.  History of BPH/urinary retention.        -generally has been voiding but is incontinent 15.  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  Follow-up per cardiology services. 16. Hematuria related to prostate cancer. Has extensive hx per son:    -continue eliquis  1/28 100k E coli in urine. Sensitive to keflex--continue x 7 days  -hgb down to 12  ---continued gross blood. Recheck hgb 1/29   LOS: 18 days A FACE TO FACE EVALUATION WAS PERFORMED  Meredith Staggers 01/12/2022, 12:45 PM

## 2022-01-13 LAB — CBC
HCT: 35.1 % — ABNORMAL LOW (ref 39.0–52.0)
Hemoglobin: 11.6 g/dL — ABNORMAL LOW (ref 13.0–17.0)
MCH: 30.5 pg (ref 26.0–34.0)
MCHC: 33 g/dL (ref 30.0–36.0)
MCV: 92.4 fL (ref 80.0–100.0)
Platelets: 254 10*3/uL (ref 150–400)
RBC: 3.8 MIL/uL — ABNORMAL LOW (ref 4.22–5.81)
RDW: 15.3 % (ref 11.5–15.5)
WBC: 6.1 10*3/uL (ref 4.0–10.5)
nRBC: 0 % (ref 0.0–0.2)

## 2022-01-13 LAB — GLUCOSE, CAPILLARY
Glucose-Capillary: 102 mg/dL — ABNORMAL HIGH (ref 70–99)
Glucose-Capillary: 109 mg/dL — ABNORMAL HIGH (ref 70–99)
Glucose-Capillary: 181 mg/dL — ABNORMAL HIGH (ref 70–99)
Glucose-Capillary: 184 mg/dL — ABNORMAL HIGH (ref 70–99)
Glucose-Capillary: 81 mg/dL (ref 70–99)
Glucose-Capillary: 99 mg/dL (ref 70–99)

## 2022-01-13 MED ORDER — FERROUS SULFATE 220 (44 FE) MG/5ML PO ELIX
220.0000 mg | ORAL_SOLUTION | Freq: Every day | ORAL | Status: DC
  Administered 2022-01-14 – 2022-02-08 (×26): 220 mg
  Filled 2022-01-13 (×26): qty 5

## 2022-01-13 NOTE — Progress Notes (Addendum)
Physical Therapy TBI Note  Patient Details  Name: [name] MRN: [id number] Date of Birth: [birth year]  Today's Date: 01/13/2022 PT Individual Time: 1015-1100 PT Individual Time Calculation (min): 45 min   Short Term Goals: Week 3:  PT Short Term Goal 1 (Week 3): Patient will perform basic transfers with min A consistently. PT Short Term Goal 2 (Week 3): Patient will ambulate mod-min A without overt LOB using LRAD >200 ft. PT Short Term Goal 3 (Week 3): Patient will perform 4 steps using 1 rail with min A of 1 person.  Skilled Therapeutic Interventions/Progress Updates:     Patient in bed asleep upon PT arrival. Patient slow to arouse to verbal and tactile stimuli and agreeable to PT session. Patient denied pain during session.  Patient incontinent of bladder in incontinence brief with continued hematuria at beginning of session. Focused session on functional mobility, orientation, and balance while performing peri-care and attempting toileting on BSC.   Patient performed sit to supine with min A for trunk control/motor planning from a flat bed without use of bed rail with mod cues for initiation and increased independence with mobility. He sat EOB x5 min due to initial onset of dizziness, resolved <2 min. Focused on trunk control in sitting due to retropulsion resulting in poster LOB x2. Donned B non-skid socks with total A while asking orientation questions, patient only oriented to self this morning, stated "I don't know," when asked where he was and why he was in the hospital, and stated the day as "Wednesday."   He performed stand pivot bed>BSC with min A, soiled brief removed with total A. Doffed shirt with supervision and min cues for sequencing and bathed back with total A and B axilla with set-up assist before stating he did not want to bath any more. Donned clean shirt with set-up assist. Donned pants with mod A for threading lower extremities with noted reduced hip  ROM/coordination, required min A for trunk control again due to retropulsion. PT donned incontinence brief with total A. Peri-care performed with total A in standing for thoroughness. He performed sit to stand with min A from [facility] with RW, PT instructed patient to perform pivot to w/c, however, patient declined, and spontaneously pivoted back to bed stating, "I just didn't plan on doing this today." Patient insistent on returning to lying at this time. He became mildly agitated when PT informed him that he would have to have the B soft wrist restraints donned for safety at this time. Utilized therapeutic use of self and redirection to eventually apply B soft wrist restraints with patient cooperation and compliance. Patient's son arrived at end of session and NT made aware that restraints were in place and patient is requesting oral care. Patient missed 15 min of skilled PT due to fatigue, RN made aware. Will attempt to make-up missed time as able.    Patient in bed with bed alarm set to mod setting, B soft wrist restraints secures, Telesitter and his son in the room, and call bell in reach.   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 Agitated Behavior Scale: TBI Observation Details Observation Environment: Pt's room Start of observation period - Date: 01/13/22 Start of observation period - Time: 1015 End of observation period - Date: 01/13/22 End of  observation period - Time: 1100 Agitated Behavior Scale (DO NOT LEAVE BLANKS) Short attention span, easy distractibility, inability to concentrate: Present to a moderate degree Impulsive, impatient, low tolerance for pain or frustration: Present to a slight  degree Uncooperative, resistant to care, demanding: Present to a slight degree Violent and/or threatening violence toward people or property: Absent Explosive and/or unpredictable anger: Absent Rocking, rubbing, moaning, or other self-stimulating behavior: Absent Pulling at tubes, restraints, etc.: Absent Wandering from treatment areas: Absent Restlessness, pacing, excessive movement: Absent Repetitive behaviors, motor, and/or verbal: Present to a slight degree Rapid, loud, or excessive talking: Absent Sudden changes of mood: Absent Easily initiated or excessive crying and/or laughter: Absent Self-abusiveness, physical and/or verbal: Absent Agitated behavior scale total score: 19    Therapy/Group: Individual Therapy  Hurshel Bouillon L Broady Lafoy PT, DPT  01/13/2022, 12:37 PM

## 2022-01-13 NOTE — Progress Notes (Signed)
Patient slept most of the night. Pleasantly confused. No agitated behaviors noted. Patient still with hematuria. Continue with plan of care.

## 2022-01-13 NOTE — Progress Notes (Addendum)
Physical Therapy Session Note  Patient Details  Name: [name] MRN: [id number] Date of Birth: [birth year]  Today's Date: 01/13/2022 PT Individual Time: 1015-1100 and 1530-1600 PT Individual Time Calculation (min): 45 min and 30 min  Short Term Goals: Week 3:  PT Short Term Goal 1 (Week 3): Patient will perform basic transfers with min A consistently. PT Short Term Goal 2 (Week 3): Patient will ambulate mod-min A without overt LOB using LRAD >200 ft. PT Short Term Goal 3 (Week 3): Patient will perform 4 steps using 1 rail with min A of 1 person.  Skilled Therapeutic Interventions/Progress Updates:     1430: Patient sitting up in the bed talking to his son upon PT arrival. PT inquired about making up missed time from am session, patient stated he was having a great visit with his son and stated, "not now." PT offered to come back in 30-60 min if available to attempt for the last time today, patient and his son appreciative and in agreement.   1530: Patient in bed with his son at bedside upon PT arrival. Patient alert and agreeable to PT session, excited for a break from his wrist restraints. Patient denied pain during session.   Patient continues to have increased language of confusion, decreased frustration tolerance, and impulsivity with poor awareness of deficits. Provided gentle encouragement, orientation, and education on deficits and stroke recovery. Discussed patient's concerns about "bloody urine," during session following toileting, see below. Educated on positive UTI results and tx and symptoms related to UTI that may be impacting behaviors. Patient continues to deny pain with urination, but does report pressure in his bladder intermittently, difficult to discern exact timing or symptoms due to cognitive and language deficits. Patient very concerned about the amount of blood and "lumps" (blood clots) in his urine, RN made aware.   Therapeutic Activity: Bed Mobility: Patient  performed supine to sit with min A for bringing R shoulder forward to push up with his L elbow with mod cues and sit to supine with supervision in a flat bed without use of bed rails.  Donned/doffed B thigh high TED hose with total A and slip on tennis shoes with set-up assist while seated EOB, required min A x2 for posterior LOB when lifting his lower extremities in sitting.  Transfers: Patient performed sit to/from stand from the hospital bed with CGA and BSC with min A for balance due to impulsivity and urgency using weighted RW. Provided verbal cues for slow controled movements and forward weight shift. Patient able to teach back appropriate hand placement while using RW from previous sessions. Patient was incontinent/continent of bladder and continent of bowl during toileting. Noted dark red bloody urine with multiple blood clots in urine, RN made aware. Patient performed anterior peri-care with set-up and max cues for understanding instructions with wash cloth. Performed posterior peri-care with total A in standing. Doffed/donned incontinence brief with total A and pants management with min A in standing.   Gait Training:  Patient ambulated >350 feet using a weighted RW with min A for steadying support and +2 w/c follow from [place] for safety. Ambulated with reduced R foot clearance and step length, variable foot placement, increased BOS, increased trunk sway, forward trunk flexion, downward head gaze, and increased gait speed. Provided verbal cues for erect posture, looking ahead, and slow controlled movements and purposeful foot placement leading with his heels at initial contact.  Required increased time and therapeutic use of self to encourage patient to comply with  use of soft wrist restraints at end of session. Patient eventually complied and B soft wrist restraints secured with patient in bed, 4 rails up for safety, his son and Telesitter in room, bed alarm set to mod setting, and call bell in reach  at end of session.   Updated safety plan for staff to perform stand pivot transfers with RW with min A, to w/c or BSC, no ambulation at this time for safety, nursing staff made aware.   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 Agitated Behavior Scale: TBI Observation Details Observation Environment: Pt's room Start of observation period - Date: 01/13/22 Start of observation period - Time: 1530 End of observation period - Date: 01/13/22 End of observation period - Time: 1600 Agitated Behavior Scale (DO NOT LEAVE BLANKS) Short attention span, easy distractibility, inability to concentrate: Present to a moderate degree Impulsive, impatient, low tolerance for pain or frustration: Present to a slight degree Uncooperative, resistant to care, demanding: Present to a slight degree Violent and/or threatening violence toward people or property: Absent Explosive and/or unpredictable anger: Absent Rocking, rubbing, moaning, or other self-stimulating behavior: Absent Pulling at tubes, restraints, etc.: Absent Wandering from treatment areas: Absent Restlessness, pacing, excessive movement: Absent Repetitive behaviors, motor, and/or verbal: Present to a slight degree Rapid, loud, or excessive talking: Absent Sudden changes of mood: Absent Easily initiated or excessive crying and/or laughter: Absent Self-abusiveness, physical and/or verbal: Absent Agitated behavior scale total score: 19   Therapy/Group: Individual Therapy  [name] PT, DPT  01/13/2022, 4:07 PM

## 2022-01-13 NOTE — Progress Notes (Signed)
PROGRESS NOTE   Subjective/Complaints: Had a much better night per RN. Pt awake when I came in. Seems comfortable, calm  ROS: Limited due to cognitive/behavioral    Objective:   No results found. Recent Labs    01/11/22 0517 01/13/22 0603  WBC 4.9 6.1  HGB 12.0* 11.6*  HCT 35.3* 35.1*  PLT 247 254    Recent Labs    01/11/22 0517  NA 138  K 4.1  CL 103  CO2 27  GLUCOSE 91  BUN 16  CREATININE 0.86  CALCIUM 8.5*     No intake or output data in the 24 hours ending 01/13/22 0944           Physical Exam: Vital Signs Blood pressure 114/61, pulse (!) 55, temperature 97.6 F (36.4 C), temperature source Oral, resp. rate 16, height 5\' 10"  (1.778 m), weight 84.2 kg, SpO2 98 %.  Constitutional: No distress . Vital signs reviewed. HEENT: NCAT, EOMI, oral membranes moist Neck: supple Cardiovascular: RRR without murmur. No JVD    Respiratory/Chest: CTA Bilaterally without wheezes or rales. Normal effort    GI/Abdomen: BS +, non-tender, non-distended Ext: no clubbing, cyanosis, or edema Psych: pleasant and cooperative  Skin: No evidence of breakdown, no evidence of rash. Surgical scar CDI Neuro:  in wrist restraints. Alert, pleasantly confused.   No resting tone. Sensory exam grossly intact. Cerebellar exam difficult to assess d/t cognition  Musculoskeletal: no jt tenderness appreciated    Assessment/Plan: 1. Functional deficits which require 3+ hours per day of interdisciplinary therapy in a comprehensive inpatient rehab setting. Physiatrist is providing close team supervision and 24 hour management of active medical problems listed below. Physiatrist and rehab team continue to assess barriers to discharge/monitor patient progress toward functional and medical goals  Care Tool:  Bathing    Body parts bathed by patient: Right arm, Left arm, Chest, Abdomen, Face   Body parts bathed by helper: Right arm,  Left arm, Chest, Abdomen     Bathing assist Assist Level: Contact Guard/Touching assist     Upper Body Dressing/Undressing Upper body dressing   What is the patient wearing?: Pull over shirt    Upper body assist Assist Level: Supervision/Verbal cueing    Lower Body Dressing/Undressing Lower body dressing      What is the patient wearing?: Pants, Incontinence brief     Lower body assist Assist for lower body dressing: Maximal Assistance - Patient 25 - 49%     Toileting Toileting    Toileting assist Assist for toileting: 2 Helpers     Transfers Chair/bed transfer  Transfers assist     Chair/bed transfer assist level: Minimal Assistance - Patient > 75% Chair/bed transfer assistive device: Programmer, multimedia   Ambulation assist   Ambulation activity did not occur: Safety/medical concerns  Assist level: Minimal Assistance - Patient > 75% Assistive device: Walker-rolling Max distance: 252 ft   Walk 10 feet activity   Assist  Walk 10 feet activity did not occur: Safety/medical concerns  Assist level: Minimal Assistance - Patient > 75% Assistive device: Walker-rolling   Walk 50 feet activity   Assist Walk 50 feet with 2 turns activity did  not occur: Safety/medical concerns  Assist level: Minimal Assistance - Patient > 75% Assistive device: Walker-rolling    Walk 150 feet activity   Assist Walk 150 feet activity did not occur: Safety/medical concerns  Assist level: Minimal Assistance - Patient > 75% Assistive device: Walker-rolling    Walk 10 feet on uneven surface  activity   Assist Walk 10 feet on uneven surfaces activity did not occur: Safety/medical concerns         Wheelchair     Assist Is the patient using a wheelchair?: Yes Type of Wheelchair: Manual Wheelchair activity did not occur: Safety/medical concerns (limited by cognitive deficits)  Wheelchair assist level: Supervision/Verbal cueing Max wheelchair  distance: 57 ft    Wheelchair 50 feet with 2 turns activity    Assist    Wheelchair 50 feet with 2 turns activity did not occur: Safety/medical concerns   Assist Level: Supervision/Verbal cueing   Wheelchair 150 feet activity     Assist  Wheelchair 150 feet activity did not occur: Safety/medical concerns       Blood pressure 114/61, pulse (!) 55, temperature 97.6 F (36.4 C), temperature source Oral, resp. rate 16, height 5\' 10"  (1.778 m), weight 84.2 kg, SpO2 98 %.    Medical Problem List and Plan: 1. 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  Status post posterior decompression with craniectomy 11/26/2021             -patient may not yet shower             -ELOS/Goals: 18-22 days, supervision to min assist goals  -Continue CIR therapies including PT, OT, and SLP  -recent HCT without acute changes -1/29-his wife has concerns about managing him at home especially if he's agitated. Her concerns may be justified. Hopefully with rx of his UTI and adjustment of his med regimen, we'll see further improvement this week 2.  Antithrombotics: -DVT/anticoagulation:  Pharmaceutical: Other (comment) Eliquis             -antiplatelet therapy: N/A 3. Pain Management: Tizanidine no longer ordered, Voltaren gel 4 times daily, Fioricet as needed, oxycodone as needed 4. Mood/sleep:                 -antipsychotic agents: seroquel               - sleep chart    - continue seroquel 50 mg qam and 150mg  qhs  -continue HS celexa for sleep and mood stabilization  -rx UTI  -agitation better overnight--observe today 5. Neuropsych: This patient is not capable of making decisions on his own behalf.  -still requires soft wrist restraints for safety/equipiment-  6. Skin/Wound Care: Routine skin checks 7. Fluids/Electrolytes/Nutrition:   Continue foley for PEG as long as it continues to function  -now on bolus  feeds   - bolus schedule was adjusted, RD added further volume  8.  Prolonged ventilatory support.  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     Decannulated 1/16---occlusive dressing to trach stoma-  closed 9.  Dysphagia.  Status post gastrostomy tube 12/13/2021 per Dr.Lovick. -continue NPO, trials with SLP. Repeat MBS per SLP this coming week 10.  Aspiration pneumonia.  Antibiotic therapy completed.  -afebrile  -oob, oxygenation improved 11.  New onset atrial fibrillation.  Started Eliquis 12/17/2021.  Marland Kitchen Continue low-dose beta-blocker.  1/29 HR controlled Vitals:   01/12/22 2025 01/13/22 0441  BP: (!) 110/59 114/61  Pulse: 62 (!) 55  Resp: 16 16  Temp: 97.8 F (36.6 C) 97.6 F (36.4 C)  SpO2: 97% 98%    12.  Hypertension.  Norvasc 10 mg daily, Cozaar 50 mg daily, , clonidine 0.1 mg every 8 hours.     -1/27 bp controlled 13.  Hyperlipidemia.  Lipitor 14.  History of BPH/urinary retention.        -generally has been voiding but is incontinent 15.  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  Follow-up per cardiology services. 16. Hematuria related to prostate cancer. Has extensive hx per wife/son:    1/29-continue eliquis for now  -100k E coli in urine. Sensitive to keflex--continue x 7 days  -hgb continues to gradually trend down--11.6 today   -recheck again tomorrow   -add iron   LOS: 19 days A FACE TO [place] 01/13/2022, 9:44 AM

## 2022-01-13 NOTE — Progress Notes (Signed)
Speech Language Pathology Daily Session Note  Patient Details  Name: [name] MRN: [id number] Date of Birth: [birth year]  Today's Date: 01/13/2022 SLP Individual Time: 1345-1415 SLP Individual Time Calculation (min): 30 min  Short Term Goals: Week 3: SLP Short Term Goal 1 (Week 3): Patient will sustain attention to functional tasks for 8 minutes with Mod verbal cues for redirection. SLP Short Term Goal 2 (Week 3): Patient will demonstrate orientaton to place, time and situation with Min verbal and visual cues. SLP Short Term Goal 3 (Week 3): Patient will perform basic problem solving tasks with Max verbal cues. SLP Short Term Goal 4 (Week 3): Patient will self-monitor and correct errors during functional tasks with Max A multimodal cues. SLP Short Term Goal 5 (Week 3): Patient will consume trials of thin liquids without overt s/s of aspiration in 75% of trials and Mod verbal cues for use of swallowing compensatory strategies. SLP Short Term Goal 6 (Week 3): Patient will utilize a second swallow after trials of liquids in 75% of opportunities with Mod verbal cues.  Skilled Therapeutic Interventions: Skilled SLP intervention focused on cognition. Oral care completed at beginning of session  and pt consumed trials of thin liquid via cup sip x3. Voice was clear after all trials he tolerated with no overt s/sx of aspiration or penetration. Son present during  end of session. Pt answered simple orientations questions with max A verbal cues and visual aid using monthly calendar in room. He was distracted by his own thoughts intermittently during session and needed redirection to increase attention. He sustained attention 7-8 min for sustained attention task with pt slp and son naming foods for A-Z. Pt required verbal reminders for his letter each turn but cues faded as task continued. Pt left seated upright in bed with son present restraints applied and call button within reach.      Pain Pain  Assessment Pain Scale: Faces Faces Pain Scale: No hurt  Therapy/Group: Individual Therapy  [name] 01/13/2022, 3:07 PM

## 2022-01-14 LAB — CBC
HCT: 33.2 % — ABNORMAL LOW (ref 39.0–52.0)
Hemoglobin: 10.8 g/dL — ABNORMAL LOW (ref 13.0–17.0)
MCH: 30.5 pg (ref 26.0–34.0)
MCHC: 32.5 g/dL (ref 30.0–36.0)
MCV: 93.8 fL (ref 80.0–100.0)
Platelets: 273 10*3/uL (ref 150–400)
RBC: 3.54 MIL/uL — ABNORMAL LOW (ref 4.22–5.81)
RDW: 15.5 % (ref 11.5–15.5)
WBC: 6.4 10*3/uL (ref 4.0–10.5)
nRBC: 0 % (ref 0.0–0.2)

## 2022-01-14 LAB — GLUCOSE, CAPILLARY
Glucose-Capillary: 113 mg/dL — ABNORMAL HIGH (ref 70–99)
Glucose-Capillary: 115 mg/dL — ABNORMAL HIGH (ref 70–99)
Glucose-Capillary: 119 mg/dL — ABNORMAL HIGH (ref 70–99)
Glucose-Capillary: 152 mg/dL — ABNORMAL HIGH (ref 70–99)
Glucose-Capillary: 98 mg/dL (ref 70–99)
Glucose-Capillary: 99 mg/dL (ref 70–99)

## 2022-01-14 LAB — BASIC METABOLIC PANEL
Anion gap: 9 (ref 5–15)
BUN: 20 mg/dL (ref 8–23)
CO2: 27 mmol/L (ref 22–32)
Calcium: 8.7 mg/dL — ABNORMAL LOW (ref 8.9–10.3)
Chloride: 106 mmol/L (ref 98–111)
Creatinine, Ser: 0.85 mg/dL (ref 0.61–1.24)
GFR, Estimated: >60 mL/min (ref >60)
Glucose, Bld: 101 mg/dL — ABNORMAL HIGH (ref 70–99)
Potassium: 4.1 mmol/L (ref 3.5–5.1)
Sodium: 142 mmol/L (ref 135–145)

## 2022-01-14 LAB — PREALBUMIN: Prealbumin: 24.5 mg/dL (ref 18–38)

## 2022-01-14 MED ORDER — CLONIDINE HCL 0.1 MG PO TABS
0.1000 mg | ORAL_TABLET | Freq: Two times a day (BID) | ORAL | Status: DC
  Administered 2022-01-15 – 2022-01-16 (×2): 0.1 mg
  Filled 2022-01-14 (×3): qty 1

## 2022-01-14 NOTE — Progress Notes (Signed)
Speech Language Pathology Daily Session Note  Patient Details  Name: Auther Lyerly MRN: [id number] Date of Birth: 1946-05-29  Today's Date: 01/14/2022 SLP Individual Time: 1000-1045 SLP Individual Time Calculation (min): 45 min  Short Term Goals: Week 3: SLP Short Term Goal 1 (Week 3): Patient will sustain attention to functional tasks for 8 minutes with Mod verbal cues for redirection. SLP Short Term Goal 2 (Week 3): Patient will demonstrate orientaton to place, time and situation with Min verbal and visual cues. SLP Short Term Goal 3 (Week 3): Patient will perform basic problem solving tasks with Max verbal cues. SLP Short Term Goal 4 (Week 3): Patient will self-monitor and correct errors during functional tasks with Max A multimodal cues. SLP Short Term Goal 5 (Week 3): Patient will consume trials of thin liquids without overt s/s of aspiration in 75% of trials and Mod verbal cues for use of swallowing compensatory strategies. SLP Short Term Goal 6 (Week 3): Patient will utilize a second swallow after trials of liquids in 75% of opportunities with Mod verbal cues.  Skilled Therapeutic Interventions: Skilled treatment session focused on dysphagia and cognitive goals. Upon arrival, patient was sitting upright in the wheelchair but appeared lethargic. Nursing reports minimal sleep last night and patient demonstrated increased language of confusion and hallucinations today. Patient remained calm and cooperative throughout session and demonstrated intermittent awareness regarding hallucinations vs reality. Patient consumed trials of ice chips with Mod-Max verbal cues needed for use of multiple swallows. Patient with overt cough X 1. Patient also consumed 4 oz of nectar-thick liquids via straw without overt s/s of aspiration with Mod verbal cues needed for use of multiple swallows. Patient with intermittent poor awareness of bolus due to verbosity but was easily redirected. Recommend continued trials  with SLP. Patient left upright in wheelchair with son present. Continue with current plan of care.      Pain Pain Assessment Pain Scale: 0-10 Pain Score: 0-No pain Faces Pain Scale: No hurt  Therapy/Group: Individual Therapy  Bettie Capistran 01/14/2022, 12:07 PM

## 2022-01-14 NOTE — Progress Notes (Signed)
Physical Therapy Note  Patient Details  Name: [name] MRN: [id number] Date of Birth: 1946-04-16 Today's Date: 01/14/2022    Patient in bed asleep with his son, Rob, at bedside upon PT arrival. Patient slow to arouse with verbal and tactile cues. Patient declined getting OOB or participating skilled interventions at this time due to fatigue. Per nursing notes, sleep chart, and Rob's report, patient only slept 1 hour last night and did participate in therapies this morning. Patient missed 60 min of skilled PT due to fatigue, RN made aware. Will attempt to make-up missed time as able.      Aryahna Spagna L Domique Clapper PT, DPT  01/14/2022, 2:29 PM

## 2022-01-14 NOTE — Progress Notes (Signed)
Physical Therapy TBI Note  Patient Details  Name: [name] MRN: [id number] Date of Birth: 1946/04/13  Today's Date: 01/14/2022 PT Individual Time: 1324-4010 PT Individual Time Calculation (min): 40 min   Short Term Goals: Week 3:  PT Short Term Goal 1 (Week 3): Patient will perform basic transfers with min A consistently. PT Short Term Goal 2 (Week 3): Patient will ambulate mod-min A without overt LOB using LRAD >200 ft. PT Short Term Goal 3 (Week 3): Patient will perform 4 steps using 1 rail with min A of 1 person.  Skilled Therapeutic Interventions/Progress Updates:      Pt sitting in w/c to start session with his son at the bedside. Pt agreeable to PT tx, denies pain. Transported pt in w/c to main rehab gym for time.   Sit<>stand to weighted RW with minA - ambulated ~12ft + ~143ft with modA and weighted RW - primary gait deficits include R>L ataxia, decreased R step length, toe catching on R, poor safety awareness with RW (required assist for managing), variable foot placement bilaterally, forward flexed trunk and downward gaze with difficulty keeping body fully within walker frame. Verbal cueing throughout slow controlled movements, slowing down gait speed, and purposeful R foot placement to reduce ataxia.   Worked on dynamic standing balance with standing ball toss with modA for standing balance during catching and tossing phase. BUE ataxia limiting ability to catch small (baseball size) ball so needed to downgrade to larger (bowling ball sized) size which improved ability to participate. Due to difficulty with this task, downgraded to stepping and reaching task and placed 2lb weights around distal arms to promote controlled movement patterns. Stepping/reaching task completed to forward target and lateral targets to promote trunk rotation.   Pt then reported urgency to void and have BM. Returned to room and pt was continent of bladder (significant hematuria) but no BM noted. Pt with  very poor safety awareness with RW and functional mobility as he was ambulating back to room and to the bathroom (abandoning RW and tripping over RW while reaching for toilet). Pt assisted in ambulatory transfer with RW back to his w/c with modA and concluded session seated in w/c with safety belt alarm on, all immediate needs within reach. His son at the bedside - wrist restraints remained off as son in room - LPN notified at conclusion of session of hematuria and pt's condition.   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 General:    Agitated Behavior Scale: TBI Observation Details Observation Environment: CIR Start of observation period - Date: 01/14/22 Start of observation period - Time: 60 End of observation period - Date: 01/14/22 End of observation period - Time: 1125 Agitated Behavior Scale (DO NOT LEAVE BLANKS) Short attention span, easy distractibility, inability to concentrate: Present to a moderate degree Impulsive, impatient, low tolerance for pain or frustration: Present to a slight degree Uncooperative, resistant to care, demanding: Absent Violent and/or threatening violence toward people or property: Absent Explosive and/or unpredictable anger: Absent Rocking, rubbing, moaning, or other self-stimulating behavior: Absent Pulling at tubes, restraints, etc.: Absent Wandering from treatment areas: Absent Restlessness, pacing, excessive movement: Absent Repetitive behaviors, motor, and/or verbal: Present to a slight degree Rapid, loud, or excessive talking: Present to a slight degree Sudden changes of mood: Absent  Easily initiated or excessive crying and/or laughter:  Absent Self-abusiveness, physical and/or verbal: Absent Agitated behavior scale total score: 19      Therapy/Group: Individual Therapy  Alger Simons 01/14/2022, 11:53 AM

## 2022-01-14 NOTE — Progress Notes (Signed)
Hematuria reported by nursing--diaper soaked with maroon urine. Chart reviewed and note hematuria has been ongoing. Will  place condom cath to evaluate UOP as well as bladder scan to make sure that patient is not retaining urine. Dicussed with Dr. Lubertha South did get Eliquis this am. Apprently he did have bloody urine with clots this am --to await CBC in am to decide on GU input.  Patient hemodynamically stable--does have drop in BP to 90's  late afternoons -->will decrease clonidine to bid to avoid afternoon drop.

## 2022-01-14 NOTE — Progress Notes (Signed)
Occupational Therapy Session Note  Patient Details  Name: [name] MRN: [id number] Date of Birth: 1946/06/26  Today's Date: 01/14/2022 OT Individual Time: 0850-1000 OT Individual Time Calculation (min): 70 min    Short Term Goals: Week 3:  OT Short Term Goal 1 (Week 3): Pt will complete LB dressing with min A OT Short Term Goal 2 (Week 3): Pt will complete toilet transfers with min A OT Short Term Goal 3 (Week 3): Pt will require no more than min cueing for sustained attention to one ADL task  Skilled Therapeutic Interventions/Progress Updates:    Pt received supine with no c/o pain, agreeable to OT session focused on shower. He completed bed mobility to EOB with (S). Pt completed ambulatory transfer to the bathroom with min-mod A with weighted RW. Pt much more automatic and appropriate with ADLs today, requiring no cueing for initiation of bathing. Pt completed UB bathing with (S), less ataxic arm movements as well. LB with min A. Pt requires frequent mod cueing for safety and stopping impulsive unsafe movements. He stood from the [facility] following his shower with min A, requiring mod A to maintain static standing balance 2/2 ataxic trunk. He returned to EOB where he donned pants with mod A, given extra time for problem solving coordination. UB dressing with (S) EOB. Frequent posterior LOB with cueing provided for body mechanics. Pt transferred to the w/c and completed oral care with set up assist. Took pt and son Rob to the tub room to discuss bathroom modifications. Pt was passed off to SLP in room.   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

## 2022-01-14 NOTE — Progress Notes (Signed)
PROGRESS NOTE   Subjective/Complaints: Restless evening  Discussed hematuria and sleep pattern with pt and son Discussed hematuria with RN   ROS: Limited due to cognitive/behavioral    Objective:   No results found. Recent Labs    01/13/22 0603 01/14/22 0646  WBC 6.1 6.4  HGB 11.6* 10.8*  HCT 35.1* 33.2*  PLT 254 273     Recent Labs    01/14/22 0646  NA 142  K 4.1  CL 106  CO2 27  GLUCOSE 101*  BUN 20  CREATININE 0.85  CALCIUM 8.7*       Intake/Output Summary (Last 24 hours) at 01/14/2022 1124 Last data filed at 01/14/2022 0446 Gross per 24 hour  Intake --  Output 450 ml  Net -450 ml             Physical Exam: Vital Signs Blood pressure (!) 130/57, pulse (!) 56, temperature 97.8 F (36.6 C), temperature source Oral, resp. rate 18, height 5\' 10"  (1.778 m), weight 84.4 kg, SpO2 100 %.   General: No acute distress Mood and affect are appropriate Heart: Regular rate and rhythm no rubs murmurs or extra sounds Lungs: Clear to auscultation, breathing unlabored, no rales or wheezes Abdomen: Positive bowel sounds, soft nontender to palpation, nondistended Extremities: No clubbing, cyanosis, or edema  Neurologic: Cranial nerves II through XII intact, motor strength is 5/5 in bilateral deltoid, bicep, tricep, grip, hip flexor, knee extensors, ankle dorsiflexor and plantar flexor Dysmetria R FNF   Skin: No evidence of breakdown, no evidence of rash. PEG site CDI  Neuro:  . Alert, pleasantly confused.   No resting tone. Sensory exam grossly intact. Cooperative  Musculoskeletal: no jt tenderness appreciated    Assessment/Plan: 1. Functional deficits which require 3+ hours per day of interdisciplinary therapy in a comprehensive inpatient rehab setting. Physiatrist is providing close team supervision and 24 hour management of active medical problems listed below. Physiatrist and rehab team continue  to assess barriers to discharge/monitor patient progress toward functional and medical goals  Care Tool:  Bathing    Body parts bathed by patient: Right arm, Left arm, Chest, Abdomen, Face   Body parts bathed by helper: Right arm, Left arm, Chest, Abdomen     Bathing assist Assist Level: Contact Guard/Touching assist     Upper Body Dressing/Undressing Upper body dressing   What is the patient wearing?: Pull over shirt    Upper body assist Assist Level: Supervision/Verbal cueing    Lower Body Dressing/Undressing Lower body dressing      What is the patient wearing?: Pants, Incontinence brief     Lower body assist Assist for lower body dressing: Maximal Assistance - Patient 25 - 49%     Toileting Toileting    Toileting assist Assist for toileting: 2 Helpers     Transfers Chair/bed transfer  Transfers assist     Chair/bed transfer assist level: Minimal Assistance - Patient > 75% Chair/bed transfer assistive device: Programmer, multimedia   Ambulation assist   Ambulation activity did not occur: Safety/medical concerns  Assist level: Minimal Assistance - Patient > 75% Assistive device: Walker-rolling Max distance: 252 ft   Walk 10 feet  activity   Assist  Walk 10 feet activity did not occur: Safety/medical concerns  Assist level: Minimal Assistance - Patient > 75% Assistive device: Walker-rolling   Walk 50 feet activity   Assist Walk 50 feet with 2 turns activity did not occur: Safety/medical concerns  Assist level: Minimal Assistance - Patient > 75% Assistive device: Walker-rolling    Walk 150 feet activity   Assist Walk 150 feet activity did not occur: Safety/medical concerns  Assist level: Minimal Assistance - Patient > 75% Assistive device: Walker-rolling    Walk 10 feet on uneven surface  activity   Assist Walk 10 feet on uneven surfaces activity did not occur: Safety/medical concerns          Wheelchair     Assist Is the patient using a wheelchair?: Yes Type of Wheelchair: Manual Wheelchair activity did not occur: Safety/medical concerns (limited by cognitive deficits)  Wheelchair assist level: Supervision/Verbal cueing Max wheelchair distance: 57 ft    Wheelchair 50 feet with 2 turns activity    Assist    Wheelchair 50 feet with 2 turns activity did not occur: Safety/medical concerns   Assist Level: Supervision/Verbal cueing   Wheelchair 150 feet activity     Assist  Wheelchair 150 feet activity did not occur: Safety/medical concerns       Blood pressure (!) 130/57, pulse (!) 56, temperature 97.8 F (36.6 C), temperature source Oral, resp. rate 18, height 5\' 10"  (1.778 m), weight 84.4 kg, SpO2 100 %.    Medical Problem List and Plan: 1. 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  Status post posterior decompression with craniectomy 11/26/2021             -patient may not yet shower             -ELOS/Goals: 18-22 days, supervision to min assist goals- team conf in am   -Continue CIR therapies including PT, OT, and SLP  -recent HCT without acute changes Still with significant agitation at noc, improves with family presence , would likely improve to a sig degree at home ?if nursing can keep pt at nsg station at [facility] in recliner  2.  Antithrombotics: -DVT/anticoagulation:  Pharmaceutical: Other (comment) Eliquis             -antiplatelet therapy: N/A 3. Pain Management: Tizanidine no longer ordered, Voltaren gel 4 times daily, Fioricet as needed, oxycodone as needed 4. Mood/sleep:                 -antipsychotic agents: seroquel               - sleep chart    - continue seroquel 50 mg qam and 150mg  qhs  -continue HS celexa for sleep and mood stabilization  -rx UTI  -agitation better overnight--observe today 5. Neuropsych: This patient is not capable of making  decisions on his own behalf.  -still requires soft wrist restraints for safety/equipiment-  6. Skin/Wound Care: Routine skin checks 7. Fluids/Electrolytes/Nutrition:   Continue foley for PEG as long as it continues to function  -now on bolus feeds   - bolus schedule was adjusted, RD added further volume  8.  Prolonged ventilatory support.  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     Decannulated 1/16---occlusive dressing to trach stoma-  closed 9.  Dysphagia.  Status post gastrostomy tube 12/13/2021 per Dr.Lovick. -continue NPO, trials with SLP. Repeat MBS per SLP this coming week 10.  Aspiration pneumonia.  Antibiotic therapy completed.  -afebrile  -oob, oxygenation improved 11.  New onset atrial fibrillation.  Started Eliquis 12/17/2021.  Marland Kitchen Continue low-dose beta-blocker.  1/30 HR controlled Vitals:   01/13/22 1947 01/14/22 0407  BP: (!) 115/58 (!) 130/57  Pulse: 61 (!) 56  Resp: 18 18  Temp: 98.7 F (37.1 C) 97.8 F (36.6 C)  SpO2: 97% 100%    12.  Hypertension.  Norvasc 10 mg daily, Cozaar 50 mg daily, , clonidine 0.1 mg every 8 hours.     -1/30 BP controlled  13.  Hyperlipidemia.  Lipitor 14.  History of BPH/urinary retention.        -generally has been voiding but is incontinent 15.  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  Follow-up per cardiology services. 16. Hematuria related to prostate cancer. Has extensive hx per wife/son:    1/29-continue eliquis for now  -100k E coli in urine. Sensitive to keflex--continue x 7 days  -hgb continues to gradually trend down--11.6 1/30   -recheck 10.8gm   Hold eliquis for at least 2-3 d to observe given 1gm per day drop   LOS: 20 days A FACE TO [place] E Haytham Maher 01/14/2022, 11:24 AM

## 2022-01-14 NOTE — Progress Notes (Signed)
Patient irritable to and restless throughout the night. Intermittent agitation. Patient slept about 1 hour max. Patient incontinent of urine. Urine still noted to be red,bloody and blood clots present. Continue to monitor.

## 2022-01-14 NOTE — Progress Notes (Signed)
Upon taking pt to the restroom this afternoon, large gross hematuria noted to be saturated through out his brief. Dr. Ella Bodo aware of hematuria on going for several days. PA Olin Hauser made aware of hematuria noted this afternoon. New orders placed for bladder scan, and condom cath. Bladder scan showed 353mL of urine within bladder. PA made aware of results, condom cath has been placed on patient. Pt is currently asleep in bed, no complaints of pain, or complications noted at this time. All needs within reach.   Dayna Ramus

## 2022-01-15 LAB — CBC
HCT: 30.9 % — ABNORMAL LOW (ref 39.0–52.0)
Hemoglobin: 10.1 g/dL — ABNORMAL LOW (ref 13.0–17.0)
MCH: 30.7 pg (ref 26.0–34.0)
MCHC: 32.7 g/dL (ref 30.0–36.0)
MCV: 93.9 fL (ref 80.0–100.0)
Platelets: 281 10*3/uL (ref 150–400)
RBC: 3.29 MIL/uL — ABNORMAL LOW (ref 4.22–5.81)
RDW: 15.6 % — ABNORMAL HIGH (ref 11.5–15.5)
WBC: 5.7 10*3/uL (ref 4.0–10.5)
nRBC: 0 % (ref 0.0–0.2)

## 2022-01-15 LAB — PSA: Prostatic Specific Antigen: 0.03 ng/mL (ref 0.00–4.00)

## 2022-01-15 LAB — GLUCOSE, CAPILLARY
Glucose-Capillary: 108 mg/dL — ABNORMAL HIGH (ref 70–99)
Glucose-Capillary: 136 mg/dL — ABNORMAL HIGH (ref 70–99)
Glucose-Capillary: 154 mg/dL — ABNORMAL HIGH (ref 70–99)
Glucose-Capillary: 155 mg/dL — ABNORMAL HIGH (ref 70–99)
Glucose-Capillary: 93 mg/dL (ref 70–99)

## 2022-01-15 MED ORDER — FINASTERIDE 5 MG PO TABS
5.0000 mg | ORAL_TABLET | Freq: Every day | ORAL | Status: DC
  Filled 2022-01-15: qty 1

## 2022-01-15 NOTE — Progress Notes (Addendum)
Patient slept comfortably most of the night. Mild agitation at the beginning of shift. Condom cath remained in place. Gross hematuria remains unchanged. Last bladder scanned at 0400 for 141ml. Continue to monitor.

## 2022-01-15 NOTE — Progress Notes (Signed)
Occupational Therapy Session Note  Patient Details  Name: [name] MRN: [id number] Date of Birth: 20-Jul-1946   Session 1:  Today's Date: 01/15/2022 OT Individual Time: 5284-1324 OT Individual Time Calculation (min): 45 min    Session 2:  Today's Date: 01/15/2022 OT Individual Time: 4010-2725 OT Individual Time Calculation (min): 70 min    Short Term Goals: Week 3:  OT Short Term Goal 1 (Week 3): Pt will complete LB dressing with min A OT Short Term Goal 2 (Week 3): Pt will complete toilet transfers with min A OT Short Term Goal 3 (Week 3): Pt will require no more than min cueing for sustained attention to one ADL task  Skilled Therapeutic Interventions/Progress Updates:    Session 1: Pt received supine with no c/o pain, alert and confused. He was agreeable to OT session, declining shower this AM. He completed bed mobility to EOB with CGA. He was able to thread pants over BLE with min A. Condom cath came off, RN entered room and ok-ed to leave it off. He completed grooming tasks at the sink with set up assist. Pt completed w/c propulsion to the therapy gym with min cueing and (S). Pt worked on Educational psychologist weighted balls off the floor (6 in up) and placing on the R side to encourage trunk rotation. Pt with ataxic trunk and UE movements requiring min-mod A overall. Pt then transitioned to the mat and completed weighted trunk flexion/ext to strengthen core. UE strengthening circuit with a 4lb dowel, pt reporting fatigue with overhead lifting and requiring frequent rest breaks. Pt returned to his room and was left sitting up with all needs met, chair alarm set.    Session 2:  Family education session completed with pt and his wife [name] and son [name]. Verbal education provided re fall risk reduction, energy conservation strategies, home carryover of transfer training, ADLs, and IADLs. Demonstration and hands on training completed for pt performance of UB/LB bathing and  dressing at min-mod A level, toileting hygiene and transfers, and shower transfers. Discussed implementation of home routine for cognitive stimulation. Pt completed toileting tasks with mod A. Furniture transfer with min A, transferring to/from couch and recliner chair. Pt was left sitting up in the w/c with all needs met, chair alarm set.   Pt with gross hematuria with continent void. Large clots present.     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

## 2022-01-15 NOTE — Patient Care Conference (Signed)
Inpatient RehabilitationTeam Conference and Plan of Care Update Date: 01/15/2022   Time: 10:01 AM    Patient Name: [name]      Medical Record Number: [id number]  Date of Birth: [birth year] Sex: Male         Room/Bed: 4W16C/4W16C-01 Payor Info: Payor: MEDICARE / Plan: MEDICARE PART A AND B / Product Type: *No Product type* /    Admit Date/Time:  12/25/2021  3:05 PM  Primary Diagnosis:  Cerebellar infarction [facility])  Hospital Problems: Principal Problem:   Cerebellar infarction [facility])    Expected Discharge Date: Expected Discharge Date: 01/23/22  Team Members Present: Physician leading conference: Dr. Alger Simons Social Worker Present: [name], [place] Nurse Present: Dorthula Nettles, RN PT Present: Page Spiro, PT OT Present: Laverle Hobby, OT SLP Present: Weston Anna, SLP PPS Coordinator present : Gunnar Fusi, SLP     Current Status/Progress Goal Weekly Team Focus  Bowel/Bladder   Incontinent of B/B half the time LBM 1/30. Condom cath added to [facility] output. Patient with ongoing bloody urine  Regain continence  continue to assist with timed toileting during the day   Swallow/Nutrition/ Hydration   NPO with PEG, trials with SLP  Min A  trials with SLP, Plan for MBS at end of week   ADL's   (S) UB ADLs, mod A LB ADLs, min-mod A transfers. Very impulsive and confused still. Hematuria severe  Supervision to CGA overall  Motor planning/sequencing, ADL retraining, transfers, family edu, d/c planning   Mobility   min A overall using RW with intermitent mod A with impulsivity and fatigue, gait >350 ft and 4 steps 2 rails  CGA-min A overall, 2 steps 1 rail  balance, safety awareness, impulse control, functional mobility, gait and stair training, activity tolerance, d/c planning, patient/caregiver education   Communication   Min A  Supervision  use of speech intelligibility strategies   Safety/Cognition/ Behavioral Observations  Max A  Min A  orientation, sustained  attention, intellectual awareness, behavior management and safety   Pain   No c/o pain  remain pain free  assess pain q shift and PRN   Skin   Peg tube site LLQ, old trach site healed. brusing to abdomen  remain free of new skin breakdown/infection        Discharge Planning:  D/c to home with his wife and son who will provide 24/7 care. Wife works from home and has a flexible schedule in which she is able to still able to provide care to patient. Pt will likely d/c home with enteral feeds, HHPT/OT/SLP/aide/SN (TF edu) and SW will coordinate care needs.   Team Discussion: Patient looking good today. Treating UTI. Wife has concerns about bringing patient home. Mood medications adjusted. Will consult Urology concerning voiding blood clots and when to restart Eliquis. Family education today and Thursday. SW will arrange tube feeds for discharge. Continent bladder, incontinent bowel. Increased Seroquel to help with sleep. Some redness to peri-area. Will do repeat MBS on Friday. Increased language of confusion. Increased talking in sleep. Tele-sitter and wrist restraints remain in place. Patient on target to meet rehab goals: yes, supervision to contact guard goals. Supervision upper body, mod assist lower body. Min/mod assist transfers. Ataxic gait. 100-150 ft with weighted walker. Completing stairs with bilateral handrails, only has one handrail at home. Continues to show minimal signs of aspiration. Continue NPO status.  *See Care Plan and progress notes for long and short-term goals.   Revisions to Treatment Plan:  Adjusting medications, follow-up  MBS.  Teaching Needs: Family education, medication management, skin/wound care, tube feed management, safety awareness, transfer/gait training, etc.  Current Barriers to Discharge: Decreased caregiver support, Incontinence, Lack of/limited family support, Medication compliance, Behavior, and hematuria.  Possible Resolutions to Barriers: Family  education Follow-up MBS Follow-up Urology Order recommended DME     Medical Summary Current Status: behavior labile at times, UTI complicating things as well. hematuria still--eliquis held, still npo  Barriers to Discharge: Medical stability;Behavior   Possible Resolutions to [employer]: continued adjustment of meds and treatment of issues above. family education   Continued Need for Acute Rehabilitation Level of Care: The patient requires daily medical management by a physician with specialized training in physical medicine and rehabilitation for the following reasons: Direction of a multidisciplinary physical rehabilitation program to maximize functional independence : Yes Medical management of patient stability for increased activity during participation in an intensive rehabilitation regime.: Yes Analysis of laboratory values and/or radiology reports with any subsequent need for medication adjustment and/or medical intervention. : Yes   I attest that I was present, lead the team conference, and concur with the assessment and plan of the team.   Cristi Loron 01/15/2022, 2:07 PM

## 2022-01-15 NOTE — Consult Note (Signed)
Reason for Consult: Prostate Cancer, Recurrent Gross Hematuria / Radiation Cystitis  Referring Physician: Oval Linsey MD  [name] is an 76 y.o. male.   HPI:   1 - Prostate Cancer - s/p external beam radiation + brachy boost + 2 years androgen deprivation at [facility] 2019 for grade 5 disease. Follows Quenten Raven MD with [facility] Urol. PSA 2022 <.04. PSA this admission pending.  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 Required OR clot evac at [facility] 2021 x 2, Cysto 11/2021 there w/o urothelial lesions. Contrast CT 11/2021 without stones / upper tract mass. Prior TURP defect seen. Overall picture most c/w radiation cystitis. NO prior hyperbaric O2. Worse during admission for CVA after placing on Eliquus. No retention.   PMH sig for CAD/CABG, AFiB, CVA (Rt sided weakness, trouble swallowing, PEG).   Today "[name]" is seen in consultation for above.    Past Medical History:  Diagnosis Date   CAD (coronary artery disease)    a. s/p CABG in 2012   Hyperlipidemia     Past Surgical History:  Procedure Laterality Date   CORONARY ARTERY BYPASS GRAFT  2012   ESOPHAGOGASTRODUODENOSCOPY N/A 12/13/2021   Procedure: ESOPHAGOGASTRODUODENOSCOPY (EGD);  Surgeon: Jesusita Oka, MD;  Location: [facility] ENDOSCOPY;  Service: General;  Laterality: N/A;   PEG PLACEMENT N/A 12/13/2021   Procedure: PERCUTANEOUS ENDOSCOPIC GASTROSTOMY (PEG) PLACEMENT;  Surgeon: Jesusita Oka, MD;  Location: [facility] ENDOSCOPY;  Service: General;  Laterality: N/A;   SUBOCCIPITAL CRANIECTOMY CERVICAL LAMINECTOMY N/A 11/26/2021   Procedure: SUBOCCIPITAL CRANIECTOMY;  Surgeon: Judith Part, MD;  Location: [place];  Service: Neurosurgery;  Laterality: N/A;    Family History  Problem Relation Age of Onset   Cancer Brother    Coronary artery disease Brother     Social History:  reports that he has quit smoking. His smoking use included cigarettes. He has never used  smokeless tobacco. He reports current alcohol use of about 3.0 standard drinks per week. He reports that he does not use drugs.  Allergies: No Known Allergies  Medications: I have reviewed the patient's current medications.  Results for orders placed or performed during the hospital encounter of 12/25/21 (from the past 48 hour(s))  Glucose, capillary     Status: Abnormal   Collection Time: 01/13/22 11:43 AM  Result Value Ref Range   Glucose-Capillary 181 (H) 70 - 99 mg/dL    Comment: Glucose reference range applies only to samples taken after fasting for at least 8 hours.  Glucose, capillary     Status: None   Collection Time: 01/13/22  4:30 PM  Result Value Ref Range   Glucose-Capillary 81 70 - 99 mg/dL    Comment: Glucose reference range applies only to samples taken after fasting for at least 8 hours.  Glucose, capillary     Status: Abnormal   Collection Time: 01/13/22  7:43 PM  Result Value Ref Range   Glucose-Capillary 184 (H) 70 - 99 mg/dL    Comment: Glucose reference range applies only to samples taken after fasting for at least 8 hours.  Glucose, capillary     Status: Abnormal   Collection Time: 01/14/22 12:14 AM  Result Value Ref Range   Glucose-Capillary 119 (H) 70 - 99 mg/dL    Comment: Glucose reference range applies only to samples taken after fasting for at least 8 hours.  Glucose, capillary     Status: None   Collection Time: 01/14/22  4:04  AM  Result Value Ref Range   Glucose-Capillary 98 70 - 99 mg/dL    Comment: Glucose reference range applies only to samples taken after fasting for at least 8 hours.  CBC     Status: Abnormal   Collection Time: 01/14/22  6:46 AM  Result Value Ref Range   WBC 6.4 4.0 - 10.5 K/uL   RBC 3.54 (L) 4.22 - 5.81 MIL/uL   Hemoglobin 10.8 (L) 13.0 - 17.0 g/dL   HCT 33.2 (L) 39.0 - 52.0 %   MCV 93.8 80.0 - 100.0 fL   MCH 30.5 26.0 - 34.0 pg   MCHC 32.5 30.0 - 36.0 g/dL   RDW 15.5 11.5 - 15.5 %   Platelets 273 150 - 400 K/uL   nRBC  0.0 0.0 - 0.2 %    Comment: Performed at [facility], [place] [address], [place], [place] [address]  Basic metabolic panel     Status: Abnormal   Collection Time: 01/14/22  6:46 AM  Result Value Ref Range   Sodium 142 135 - 145 mmol/L   Potassium 4.1 3.5 - 5.1 mmol/L   Chloride 106 98 - 111 mmol/L   CO2 27 22 - 32 mmol/L   Glucose, Bld 101 (H) 70 - 99 mg/dL    Comment: Glucose reference range applies only to samples taken after fasting for at least 8 hours.   BUN 20 8 - 23 mg/dL   Creatinine, Ser 0.85 0.61 - 1.24 mg/dL   Calcium 8.7 (L) 8.9 - 10.3 mg/dL   GFR, Estimated >60 >60 mL/min    Comment: (NOTE) Calculated using the CKD-EPI Creatinine Equation (2021)    Anion gap 9 5 - 15    Comment: Performed at [facility] [address], [place], [place] [address]  Prealbumin     Status: None   Collection Time: 01/14/22  6:46 AM  Result Value Ref Range   Prealbumin 24.5 18 - 38 mg/dL    Comment: Performed at [facility] [address] [place], [place] [address]  Glucose, capillary     Status: None   Collection Time: 01/14/22  8:12 AM  Result Value Ref Range   Glucose-Capillary 99 70 - 99 mg/dL    Comment: Glucose reference range applies only to samples taken after fasting for at least 8 hours.  Glucose, capillary     Status: Abnormal   Collection Time: 01/14/22 11:46 AM  Result Value Ref Range   Glucose-Capillary 115 (H) 70 - 99 mg/dL    Comment: Glucose reference range applies only to samples taken after fasting for at least 8 hours.  Glucose, capillary     Status: Abnormal   Collection Time: 01/14/22  4:50 PM  Result Value Ref Range   Glucose-Capillary 152 (H) 70 - 99 mg/dL    Comment: Glucose reference range applies only to samples taken after fasting for at least 8 hours.  Glucose, capillary     Status: Abnormal   Collection Time: 01/14/22  8:13 PM  Result Value Ref Range   Glucose-Capillary 113 (H) 70 - 99 mg/dL    Comment: Glucose reference range  applies only to samples taken after fasting for at least 8 hours.  Glucose, capillary     Status: Abnormal   Collection Time: 01/15/22 12:27 AM  Result Value Ref Range   Glucose-Capillary 136 (H) 70 - 99 mg/dL    Comment: Glucose reference range applies only to samples taken after fasting for  at least 8 hours.   Comment 1 Document in Chart   Glucose, capillary     Status: Abnormal   Collection Time: 01/15/22  4:03 AM  Result Value Ref Range   Glucose-Capillary 108 (H) 70 - 99 mg/dL    Comment: Glucose reference range applies only to samples taken after fasting for at least 8 hours.  CBC     Status: Abnormal   Collection Time: 01/15/22  5:20 AM  Result Value Ref Range   WBC 5.7 4.0 - 10.5 K/uL   RBC 3.29 (L) 4.22 - 5.81 MIL/uL   Hemoglobin 10.1 (L) 13.0 - 17.0 g/dL   HCT 30.9 (L) 39.0 - 52.0 %   MCV 93.9 80.0 - 100.0 fL   MCH 30.7 26.0 - 34.0 pg   MCHC 32.7 30.0 - 36.0 g/dL   RDW 15.6 (H) 11.5 - 15.5 %   Platelets 281 150 - 400 K/uL   nRBC 0.0 0.0 - 0.2 %    Comment: Performed at [facility] [address], [place], [place] [address]  Glucose, capillary     Status: None   Collection Time: 01/15/22  8:05 AM  Result Value Ref Range   Glucose-Capillary 93 70 - 99 mg/dL    Comment: Glucose reference range applies only to samples taken after fasting for at least 8 hours.    No results found.  Review of Systems  Constitutional:  Negative for chills and fever.  Genitourinary:  Positive for hematuria and urgency.  Neurological:  Positive for speech difficulty, weakness and numbness.  All other systems reviewed and are negative. Blood pressure (!) 106/53, pulse (!) 55, temperature 98.2 F (36.8 C), temperature source Oral, resp. rate 16, height 5\' 10"  (1.778 m), weight 85 kg, SpO2 98 %. Physical Exam Vitals reviewed.  Constitutional:      Comments: Very pleasant. Some word searching / mild cognitive difficulty. Wife and son at bedside who are very helpful with history.    HENT:     Head: Normocephalic.  Eyes:     Pupils: Pupils are equal, round, and reactive to light.  Cardiovascular:     Rate and Rhythm: Normal rate.  Pulmonary:     Effort: Pulmonary effort is normal.  Abdominal:     General: Abdomen is flat.     Comments: PEG in place, capped, CDI.   Genitourinary:    Comments: No CVAT. Radiation tattoos noted. UNcirc'd. No phimosis. No distended bladder.  Musculoskeletal:     Cervical back: Normal range of motion.  Skin:    General: Skin is warm.  Neurological:     General: No focal deficit present.     Mental Status: He is alert.  Psychiatric:        Mood and Affect: Mood normal.    Assessment/Plan:  1 - Prostate Cancer - PSA today, under good control per history.   2 - Recurrent Gross Hematuria / Radiation Cystitis - Unfortunate but common long term side effect of pelvic radiation. Unfortunately he cannot receive finasteride (cannot crush for PEG). Agree with hold Eliquus as significant active bleeding.  Avoid GU instrumentation / catheters if possible (will worsen bleeding acutely). Consider OR clot evac and Firmagon injection if becomes refracotry or with frank retention. Also consider outpatient hyperbaric O2 as that is best therapy, but cannot really be done in inpatient setting.  Pt and family updated on plan and have very good understanding.  Please call me directly with questions anytime. Clint Lipps [phone]  Alexis Frock 01/15/2022, 11:22 AM

## 2022-01-15 NOTE — Progress Notes (Addendum)
Pt continues to have large blood clots with voiding. Pt does not complain of pain.   Melene Plan, LPN

## 2022-01-15 NOTE — Progress Notes (Signed)
Finasteride ordered by Dr Tresa Moore oral. Spoke with MD, okay to hold medication until pt is seen by Dr Tresa Moore due to pt being NPO and having all meds per Tube.

## 2022-01-15 NOTE — Progress Notes (Addendum)
PROGRESS NOTE   Subjective/Complaints: Pt up with OT. Had a pretty good night. Working on dressing/bathing at sink. Continue hematuria, denies any pain/dysuria  ROS: Limited due to cognitive/behavioral    Objective:   No results found. Recent Labs    01/14/22 0646 01/15/22 0520  WBC 6.4 5.7  HGB 10.8* 10.1*  HCT 33.2* 30.9*  PLT 273 281    Recent Labs    01/14/22 0646  NA 142  K 4.1  CL 106  CO2 27  GLUCOSE 101*  BUN 20  CREATININE 0.85  CALCIUM 8.7*      Intake/Output Summary (Last 24 hours) at 01/15/2022 1111 Last data filed at 01/15/2022 0700 Gross per 24 hour  Intake 0 ml  Output 625 ml  Net -625 ml             Physical Exam: Vital Signs Blood pressure (!) 106/53, pulse (!) 55, temperature 98.2 F (36.8 C), temperature source Oral, resp. rate 16, height 5\' 10"  (1.778 m), weight 85 kg, SpO2 98 %.   Constitutional: No distress . Vital signs reviewed. HEENT: NCAT, EOMI, oral membranes moist Neck: supple Cardiovascular: RRR without murmur. No JVD    Respiratory/Chest: CTA Bilaterally without wheezes or rales. Normal effort    GI/Abdomen: BS +, non-tender, non-distended Ext: no clubbing, cyanosis, or edema Psych: pleasant and cooperative  Neurologic: Cranial nerves II through XII intact, motor strength is 5/5 in bilateral deltoid, bicep, tricep, grip, hip flexor, knee extensors, ankle dorsiflexor and plantar flexor  Skin: No evidence of breakdown, no evidence of rash. PEG site CDI  Neuro:  . Alert,more appropriate. Oriented to person, [place], [place]. Following basic commands.   No resting tone. Sensory exam grossly intact. Cooperative  Musculoskeletal: no jt tenderness appreciated    Assessment/Plan: 1. Functional deficits which require 3+ hours per day of interdisciplinary therapy in a comprehensive inpatient rehab setting. Physiatrist is providing close team supervision and 24  hour management of active medical problems listed below. Physiatrist and rehab team continue to assess barriers to discharge/monitor patient progress toward functional and medical goals  Care Tool:  Bathing    Body parts bathed by patient: Right arm, Left arm, Chest, Abdomen, Face   Body parts bathed by helper: Right arm, Left arm, Chest, Abdomen     Bathing assist Assist Level: Contact Guard/Touching assist     Upper Body Dressing/Undressing Upper body dressing   What is the patient wearing?: Pull over shirt    Upper body assist Assist Level: Supervision/Verbal cueing    Lower Body Dressing/Undressing Lower body dressing      What is the patient wearing?: Pants, Incontinence brief     Lower body assist Assist for lower body dressing: Maximal Assistance - Patient 25 - 49%     Toileting Toileting    Toileting assist Assist for toileting: 2 Helpers     Transfers Chair/bed transfer  Transfers assist     Chair/bed transfer assist level: Minimal Assistance - Patient > 75% Chair/bed transfer assistive device: Programmer, multimedia   Ambulation assist   Ambulation activity did not occur: Safety/medical concerns  Assist level: Minimal Assistance - Patient > 75% Assistive  device: Walker-rolling Max distance: 252 ft   Walk 10 feet activity   Assist  Walk 10 feet activity did not occur: Safety/medical concerns  Assist level: Minimal Assistance - Patient > 75% Assistive device: Walker-rolling   Walk 50 feet activity   Assist Walk 50 feet with 2 turns activity did not occur: Safety/medical concerns  Assist level: Minimal Assistance - Patient > 75% Assistive device: Walker-rolling    Walk 150 feet activity   Assist Walk 150 feet activity did not occur: Safety/medical concerns  Assist level: Minimal Assistance - Patient > 75% Assistive device: Walker-rolling    Walk 10 feet on uneven surface  activity   Assist Walk 10 feet on uneven  surfaces activity did not occur: Safety/medical concerns         Wheelchair     Assist Is the patient using a wheelchair?: Yes Type of Wheelchair: Manual Wheelchair activity did not occur: Safety/medical concerns (limited by cognitive deficits)  Wheelchair assist level: Supervision/Verbal cueing Max wheelchair distance: 57 ft    Wheelchair 50 feet with 2 turns activity    Assist    Wheelchair 50 feet with 2 turns activity did not occur: Safety/medical concerns   Assist Level: Supervision/Verbal cueing   Wheelchair 150 feet activity     Assist  Wheelchair 150 feet activity did not occur: Safety/medical concerns       Blood pressure (!) 106/53, pulse (!) 55, temperature 98.2 F (36.8 C), temperature source Oral, resp. rate 16, height 5\' 10"  (1.778 m), weight 85 kg, SpO2 98 %.    Medical Problem List and Plan: 1. 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  Status post posterior decompression with craniectomy 11/26/2021             -patient may not yet shower             -ELOS/Goals: 18-22 days, supervision to min assist goals- team conf in am   -Continue CIR therapies including PT, OT, and SLP  -recent HCT without acute changes -confusion and agitation worse at night and when family not around 2.  Antithrombotics: -DVT/anticoagulation:  Pharmaceutical: Other (comment) Eliquis             -antiplatelet therapy: N/A 3. Pain Management: Tizanidine no longer ordered, Voltaren gel 4 times daily, Fioricet as needed, oxycodone as needed 4. Mood/sleep:                 -antipsychotic agents: seroquel               - sleep chart    - continue seroquel 50 mg qam and 150mg  qhs  -continue HS celexa for sleep and mood stabilization  -rx'ing UTI  -agitation improving. ABS only 16-19 yesterday 5. Neuropsych: This patient is not capable of making decisions on his own behalf.  -still  requires soft wrist restraints for safety/equipiment-  6. Skin/Wound Care: Routine skin checks 7. Fluids/Electrolytes/Nutrition:   Continue foley for PEG as long as it continues to function  -now on bolus feeds   - bolus schedule was adjusted, RD added further volume to TF 8.  Prolonged ventilatory support.  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     Decannulated 1/16---occlusive dressing to trach stoma-  closed 9.  Dysphagia.  Status post gastrostomy tube 12/13/2021 per Dr.Lovick. -continue NPO, trials with SLP. Repeat MBS per SLP this coming week 10.  Aspiration pneumonia.  Antibiotic therapy completed.  -afebrile  -oob,  oxygenation improved 11.  New onset atrial fibrillation.  Started Eliquis 12/17/2021.  Marland Kitchen Continue low-dose beta-blocker.  1/30 HR controlled Vitals:   01/14/22 1933 01/15/22 0410  BP: (!) 107/56 (!) 106/53  Pulse: 76 (!) 55  Resp: 16 16  Temp: 99.3 F (37.4 C) 98.2 F (36.8 C)  SpO2: 100% 98%    12.  Hypertension.  Norvasc 10 mg daily, Cozaar 50 mg daily, , clonidine 0.1 mg every 8 hours.     -1/30 BP controlled  13.  Hyperlipidemia.  Lipitor 14.  History of BPH/urinary retention.        -generally has been voiding but is incontinent 15.  CAD with CABG times 03/2011 in [place].  Follow-up per cardiology services. 16. 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 Has extensive hx per wife/son:    1/31 eliquis now on hold -rxing 100k E coli UTI with keflex  -hgb down 10.1 today  -continue to hold eliquis  -spoke to urology today who says options are pretty limited given hx   -likely will start finasteride    -appreciate urology assist  -continue to track hgb, transfuse if needed   LOS: 21 days A FACE TO FACE EVALUATION WAS PERFORMED  Meredith Staggers 01/15/2022, 11:11 AM

## 2022-01-15 NOTE — Progress Notes (Signed)
Speech Language Pathology Daily Session Note  Patient Details  Name: Nyzir Dubois MRN: [id number] Date of Birth: 15-Jun-1946  Today's Date: 01/15/2022 SLP Individual Time: 1100-1130 SLP Individual Time Calculation (min): 30 min  Short Term Goals: Week 3: SLP Short Term Goal 1 (Week 3): Patient will sustain attention to functional tasks for 8 minutes with Mod verbal cues for redirection. SLP Short Term Goal 2 (Week 3): Patient will demonstrate orientaton to place, time and situation with Min verbal and visual cues. SLP Short Term Goal 3 (Week 3): Patient will perform basic problem solving tasks with Max verbal cues. SLP Short Term Goal 4 (Week 3): Patient will self-monitor and correct errors during functional tasks with Max A multimodal cues. SLP Short Term Goal 5 (Week 3): Patient will consume trials of thin liquids without overt s/s of aspiration in 75% of trials and Mod verbal cues for use of swallowing compensatory strategies. SLP Short Term Goal 6 (Week 3): Patient will utilize a second swallow after trials of liquids in 75% of opportunities with Mod verbal cues.  Skilled Therapeutic Interventions: Skilled treatment session focused on dysphagia and cognitive goals. Upon arrival, patient was awake while upright in bed with family present. SLP facilitated session by providing Mod verbal and visual cues for problem solving with oral care via the suction toothbrush as patient was attempting to place the toothbrush down his pants. Patient with ongoing language of confusion today but overall appeared more alert. Patient consumed trials of ice chips without overt s/s of aspiration despite swallowing one ice chip whole prior to mastication. Patient also consumed nectar-thick liquids with overt cough X 1, suspect due to talking with a full oral cavity. Mod-Max verbal cues were needed for use of multiple swallows to clear suspected residue. Recommend patient remain NPO and anticipate repeat MBS this week.  Patient left upright in bed with alarm on and all needs within reach and family present. Continue with current plan of care.      Pain No/Denies Pain   Therapy/Group: Individual Therapy  Shritha Bresee 01/15/2022, 12:08 PM

## 2022-01-15 NOTE — Progress Notes (Signed)
Physical Therapy TBI Note  Patient Details  Name: [name] MRN: [id number] Date of Birth: 09-Mar-1946  Today's Date: 01/15/2022 PT Individual Time: 0905-1000 PT Individual Time Calculation (min): 55 min   Short Term Goals: Week 3:  PT Short Term Goal 1 (Week 3): Patient will perform basic transfers with min A consistently. PT Short Term Goal 2 (Week 3): Patient will ambulate mod-min A without overt LOB using LRAD >200 ft. PT Short Term Goal 3 (Week 3): Patient will perform 4 steps using 1 rail with min A of 1 person.  Skilled Therapeutic Interventions/Progress Updates:    Pt received sitting in manual (non-tilt) wheelchair wearing only seat belt alarm (no UE restraints) with nurse present providing morning medication. Pt agreeable to therapy session and appears overall in good spirits. Assessed seated vitals: BP 127/61 (MAP 80), HR 83bpm Transported to/from gym in w/c for time management and energy conservation. 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 Therapist provided seated rest and retrieved BP cuff when pt suddenly reports need to vomit - provided trash can and cool wash clothe with pt having yellow emesis. Vitals assessed as below.  Pt very sweet and repeatedly states "I'm  OK."  Vitals: After vomiting: BP 141/90 (MAP 106), HR 112bpm  After 5 minute seated rest: BP 154/73 (MAP 93), HR 97bpm  After 4 additional minutes of rest: BP 132/74 (MAP 88), HR 90bpm   Pt reports feeling up to continuing with therapy session and gait training therefore returned to activity 2 minutes later.  Gait training ~16ft using weighted RW - pt continues to demo above gait deviations with B LE ataxia/incoordination - light min assist for balance throughout.  Vitals after gait: BP 122/72 (MAP 85), HR 98bpm   Gait training ~23ft using weighted RW with min progressed to mod assist due to pt starting to have B LE knees buckling/sagging into flexion requiring heavy mod assist to maintain upright and pt using B UE support on RW - pt reports he just feels "weak."  Returned to seated in w/c and assessed vitals: BP 119/73 (MAP 87), HR 96bpm, SpO2 100%  Gait training ~62ft using weighted RW with +2 present for w/c follow for safety - no instances of B LE knee weakness/buckling this time but pt does fatigue quickly requiring seated rest break and pt states "I think I've done enough"  Pt returned to room. L partial stand pivot using bedrail with heavy min assist for balance. Sit>supine with CGA. Per nursing, pt still required to wear B wrist restraints in the bed for safety therefore donned them - pt left with needs in reach and bed alarm on.  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   Agitated Behavior Scale: TBI  Observation Details Observation Environment: CIR Start of observation period - Date: 01/15/22 Start of observation  period - Time: 1400 End of observation period - Date: 01/15/22 End of observation period - Time: 1500 Agitated Behavior Scale (DO NOT LEAVE BLANKS) Short attention span, easy distractibility, inability to concentrate: Present to a slight degree Impulsive, impatient, low tolerance for pain or frustration: Present to a slight degree Uncooperative, resistant to care, demanding: Present to a slight degree Violent and/or threatening violence toward people or property: Absent Explosive and/or unpredictable anger: Present to a slight degree Rocking, rubbing, moaning, or other self-stimulating behavior: Absent Pulling at tubes, restraints, etc.: Absent Wandering from treatment areas: Absent Restlessness, pacing, excessive movement: Present to a slight degree Repetitive behaviors, motor, and/or verbal: Present to a slight degree Rapid, loud, or excessive talking: Absent Sudden changes of mood: Present to a slight degree Easily initiated or excessive crying and/or laughter: Absent Self-abusiveness, physical and/or verbal: Absent Agitated behavior scale total score: 21    Therapy/Group: Individual Therapy  Tawana Scale , PT, DPT, NCS, CSRS  01/15/2022, 7:49 AM

## 2022-01-15 NOTE — Progress Notes (Signed)
Patient ID: [name], male   DOB: 1946/08/09, 76 y.o.   MRN: [id number]  SW met with pt, pt wife [name], and pt son [name] to provide updates from team conference, d/c date remains 2/8, SW will follow-up once updates on DME needs, discussed enteral feeds and will find out cost of services, and provided HHA list. Confirms fam edu on Thursday 1pm-4pm with wife and son.   [name], MSW, [place] Office: [phone] Cell: [phone] Fax: [phone]

## 2022-01-16 LAB — GLUCOSE, CAPILLARY
Glucose-Capillary: 104 mg/dL — ABNORMAL HIGH (ref 70–99)
Glucose-Capillary: 117 mg/dL — ABNORMAL HIGH (ref 70–99)
Glucose-Capillary: 119 mg/dL — ABNORMAL HIGH (ref 70–99)
Glucose-Capillary: 137 mg/dL — ABNORMAL HIGH (ref 70–99)
Glucose-Capillary: 86 mg/dL (ref 70–99)

## 2022-01-16 NOTE — Progress Notes (Signed)
Physical Therapy TBI Note  Patient Details  Name: [name] MRN: [id number] Date of Birth: 08/10/1946  Today's Date: 01/16/2022 PT Individual Time: 1117-1159 PT Individual Time Calculation (min): 42 min   Short Term Goals: Week 3:  PT Short Term Goal 1 (Week 3): Patient will perform basic transfers with min A consistently. PT Short Term Goal 2 (Week 3): Patient will ambulate mod-min A without overt LOB using LRAD >200 ft. PT Short Term Goal 3 (Week 3): Patient will perform 4 steps using 1 rail with min A of 1 person.  Skilled Therapeutic Interventions/Progress Updates:     Pt received supine in bed and agrees to therapy. No complaint of pain but reports needing to use restroom. Supine to sit with minA and cue for hand placement and sequencing. Sit to stand with minA and modA to steady pt in standing secondary to ataxic trunk movements. Pt ambulates to toilet with RW and modA. Pt is continent of urine and performs sit to stand with minA and cues for hand placement and initiation. WC transport to gym for time management. Pt performs multiple bouts of ambulation without AD. Pt ambulates x325' total (with 4 seated rest breaks) with modA to maxA overall, though is able to maintain 15'-20' sequences of minA for. PT places 4lb ankle weights on each leg to provide NM feedback and has slightly improved coordination and control with ankle weights. Pt tends to scissor, especially with fatigue and when distracted. Able to improve gait pattern with cues for wider BOS and maintaining upright gaze. Following extended seated rest break, pt ambulates final 150' without AD, no ankle weights, and with modA from PT for trunk stabilization. Pt requires additional cueing for safe transfer back to bed. Sit to supine with cues on positioning. Left with alarm intact and all needs within reach.  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  Agitated Behavior Scale: TBI Observation Details Observation Environment: [facility] Start of observation period - Date: 01/16/22 Start of observation period - Time: 1115 End of observation period - Date: 01/16/22 End of observation period - Time: 1200 Agitated Behavior Scale (DO NOT LEAVE BLANKS) Short attention span, easy distractibility, inability to concentrate: Present to a slight degree Impulsive, impatient, low tolerance for pain or frustration: Present to a slight degree Uncooperative, resistant to care, demanding: Absent Violent and/or threatening violence toward people or property: Absent Explosive and/or unpredictable anger: Absent Rocking, rubbing, moaning, or other self-stimulating behavior: Absent Pulling at tubes, restraints, etc.: Absent Wandering from treatment areas: Absent Restlessness, pacing, excessive movement: Absent Repetitive behaviors, motor, and/or verbal: Absent Rapid, loud, or excessive talking: Absent Sudden changes of mood: Absent Easily initiated or excessive crying and/or laughter: Absent Self-abusiveness, physical and/or verbal: Absent Agitated behavior scale total score: 16   Therapy/Group: Individual Therapy  [name], PT, DPT 01/16/2022, 4:35 PM

## 2022-01-16 NOTE — Progress Notes (Signed)
Occupational Therapy Weekly Progress Note  Patient Details  Name: [name] MRN: [id number] Date of Birth: 1946/09/25  Beginning of progress report period: Jan 09, 2022 End of progress report period: Jan 16, 2022  Today's Date: 01/16/2022 OT Individual Time: 1400-1500 OT Individual Time Calculation (min): 60 min    Patient has met 3 of 3 short term goals. [name] has continued to progress with OT despite ongoing cognitive fluctuations day vs night. He is overall at a min-mod A level. He still has significant truncal and UE/LE ataxia which diminishes his independence with ADLs. Family education is ongoing with his son [name] and wife [name].   Patient continues to demonstrate the following deficits: muscle weakness, decreased cardiorespiratoy endurance, impaired timing and sequencing, ataxia, decreased coordination, and decreased motor planning, decreased motor planning, decreased initiation, decreased attention, decreased awareness, decreased problem solving, decreased safety awareness, decreased memory, and delayed processing, and decreased sitting balance, decreased standing balance, decreased postural control, and decreased balance strategies and therefore will continue to benefit from skilled OT intervention to enhance overall performance with BADL and Reduce care partner burden.  Patient progressing toward long term goals..  Continue plan of care.  OT Short Term Goals Week 3:  OT Short Term Goal 1 (Week 3): Pt will complete LB dressing with min A OT Short Term Goal 1 - Progress (Week 3): Met OT Short Term Goal 2 (Week 3): Pt will complete toilet transfers with min A OT Short Term Goal 2 - Progress (Week 3): Met OT Short Term Goal 3 (Week 3): Pt will require no more than min cueing for sustained attention to one ADL task OT Short Term Goal 3 - Progress (Week 3): Met Week 4:  OT Short Term Goal 1 (Week 4): STG = LTG d/t ELOS  Skilled Therapeutic Interventions/Progress Updates:    Pt  received supine, pressing call bell for assist to use the bathroom! Pt not necessarily more agitated but with a lower frustration tolerance and in a worse than usual mood this afternoon. He required mod cueing for impulsivity throughout session. He completed sit >stand with min A and min-mod A for ambulatory transfer to the bathroom. He completed toileting tasks with mod A overall. No hematuria! Pt required mod cueing for pacing today and for motor planning positioning within RW. He was brought to the ADL apt where he worked on [place] coordination and standing balance as he reached into cabinets and retrieved functional food items, also addressing hand prehension and grading of force. His movements were ataxic but 75% accurate overall. Pt was then taken into the therapy gym where he was placed into the MaxiSky walking sling. This allowed OT to push boundaries of balance perturbations pt had to facilitate strengthening and automaticity of righting reactions. Pt completed functional mobility forward and back without the RW with scissoring of the BLE and severe LOB's that would have resulted in a fall outside of the support of the sling. Worked on functional stepping with similar result. Pt returned to his room and was left sitting up with his SIL Estill Bamberg present and supervision. Chair alarm belt on.   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

## 2022-01-16 NOTE — Progress Notes (Signed)
2046: Pt assessed and vitals taken. BP 96/57 Clonidine held with parameters to hold for SBP less than 100 and DBP less than 60. Charge nurse notified. Pt denies any distress. Pt is a/o at baseline.   0000: BP rechecked 119/58.  PT is calm, Assisted Pt to bathroom without difficultly with 2 people assisting. Pt able to stand pivot and using the wheelchair.    0000: CBG 117. Feedings tolerated well.

## 2022-01-16 NOTE — Progress Notes (Signed)
Speech Language Pathology Weekly Progress and Session Note  Patient Details  Name: [name] MRN: [id number] Date of Birth: 1946/02/24  Beginning of progress report period: January 09, 2022 End of progress report period: January 16, 2022  Today's Date: 01/16/2022 SLP Individual Time: 0905-0950 SLP Individual Time Calculation (min): 45 min  Short Term Goals: Week 3: SLP Short Term Goal 1 (Week 3): Patient will sustain attention to functional tasks for 8 minutes with Mod verbal cues for redirection. SLP Short Term Goal 1 - Progress (Week 3): Met SLP Short Term Goal 2 (Week 3): Patient will demonstrate orientaton to place, time and situation with Min verbal and visual cues. SLP Short Term Goal 2 - Progress (Week 3): Not met SLP Short Term Goal 3 (Week 3): Patient will perform basic problem solving tasks with Max verbal cues. SLP Short Term Goal 3 - Progress (Week 3): Met SLP Short Term Goal 4 (Week 3): Patient will self-monitor and correct errors during functional tasks with Max A multimodal cues. SLP Short Term Goal 4 - Progress (Week 3): Met SLP Short Term Goal 5 (Week 3): Patient will consume trials of thin liquids without overt s/s of aspiration in 75% of trials and Mod verbal cues for use of swallowing compensatory strategies. SLP Short Term Goal 5 - Progress (Week 3): Met SLP Short Term Goal 6 (Week 3): Patient will utilize a second swallow after trials of liquids in 75% of opportunities with Mod verbal cues. SLP Short Term Goal 6 - Progress (Week 3): Met    New Short Term Goals: Week 4: SLP Short Term Goal 1 (Week 4): STGs=LTGs due to ELOS  Weekly Progress Updates: Patient has made functional gains and has met 5 of 6 STGs this reporting period. Currently, patient remains NPO but demonstrates decreased overt s/s of aspiration across all consistencies with increased ability to utilize multiple swallows with overall Mod verbal cues. Recommend patient remain NPO with repeat MBS on 2/3  to assess swallowing function. Patient's overall cognitive functioning continues to fluctuate with ongoing language of confusion noted with intermittent confabulation and hallucinations. Overall, patient requires Mod-Max A multimodal cues to complete basic and familiar tasks safely in regards to orientation, attention, awareness and basic problem solving. Patient and family education ongoing. Patient would benefit from continued skilled SLP intervention to maximize his cognitive and swallowing function prior to discharge.      Intensity: Minumum of 1-2 x/day, 30 to 90 minutes Frequency: 3 to 5 out of 7 days Duration/Length of Stay: 01/23/22 Treatment/Interventions: Cognitive remediation/compensation;Internal/external aids;Speech/Language facilitation;Therapeutic Activities;Environmental controls;Cueing hierarchy;Functional tasks;Patient/family education;Dysphagia/aspiration precaution training   Daily Session  Skilled Therapeutic Interventions:   Skilled treatment session focused on cognitive and dysphagia goals. Upon arrival, patient was asleep in bed but easily roused. Patient with language of confusion and confabulation that was easily redirected. Patient performed oral care via the suction toothbrush with Supervision verbal cues for problem solving. Patient consumed trials of ice chips, thin liquids and nectar-thick liquids. Patient with intermittent and delayed overt s/s of aspiration with Min verbal cues needed for use of multiple swallows. Recommend patient remain NPO with trials from SLP. Throughout trials, patient was verbose and required Mod verbal cues for attention to task. Patient also required Mod visual cues for orientation to date. Patient requested to use the bathroom at end of session. Max verbal cues were needed for safety with task due to impulsivity. Patient was continent of bladder and urine appeared clear! Patient transferred back to bed and left with  alarm on and all needs within  reach. Continue with current plan of care.     Pain No/Denies Pain   Therapy/Group: Individual Therapy  Espen Bethel 01/16/2022, 6:47 AM

## 2022-01-16 NOTE — Progress Notes (Addendum)
Patient ID: [name], male   DOB: 05/22/1946, 76 y.o.   MRN: [id number]  SW left message for Amy Allen/[facility] enteral feed dept [phone] option #5, ext [phone]) to discuss referral. SW waiting on follow-up.  *SW spoke with Amy to discuss referral. Reports can fax clinicals for review. Reports that pt should not have an issue with coverage as long as he has no food by mouth. States they are on back order for Osmolite 1.5, and Nutren 1.5 is the equivalent. SW informed will check with dietician to see if appropriate. SW faxed referral to [phone].  SW spoke with RD Corrin Parker to inform on above. Does not report any concerns about pt using Nutren 1.5 at d/c.   [name], MSW, [place] Office: [phone] Cell: [phone] Fax: [phone]

## 2022-01-16 NOTE — Progress Notes (Addendum)
PROGRESS NOTE   Subjective/Complaints: Was up a bit more last night. Still having gross blood, clots in urine without pain or retention.   ROS: Limited due to cognitive/behavioral    Objective:   No results found. Recent Labs    01/14/22 0646 01/15/22 0520  WBC 6.4 5.7  HGB 10.8* 10.1*  HCT 33.2* 30.9*  PLT 273 281    Recent Labs    01/14/22 0646  NA 142  K 4.1  CL 106  CO2 27  GLUCOSE 101*  BUN 20  CREATININE 0.85  CALCIUM 8.7*      Intake/Output Summary (Last 24 hours) at 01/16/2022 0836 Last data filed at 01/15/2022 2333 Gross per 24 hour  Intake 415 ml  Output 150 ml  Net 265 ml             Physical Exam: Vital Signs Blood pressure 119/62, pulse 60, temperature 97.6 F (36.4 C), resp. rate 18, height 5\' 10"  (1.778 m), weight 85 kg, SpO2 96 %.   Constitutional: No distress . Vital signs reviewed. HEENT: NCAT, EOMI, oral membranes moist Neck: supple Cardiovascular: RRR without murmur. No JVD    Respiratory/Chest: CTA Bilaterally without wheezes or rales. Normal effort    GI/Abdomen: BS +, non-tender, non-distended Ext: no clubbing, cyanosis, or edema Psych: pleasant and cooperative  Neurologic: Cranial nerves II through XII intact, motor strength is 5/5 in bilateral deltoid, bicep, tricep, grip, hip flexor, knee extensors, ankle dorsiflexor and plantar flexor GU; no gross blood on penis, urine red Skin: No evidence of breakdown, no evidence of rash. PEG site CDI  Neuro:  . Aroused pretty easily.  Oriented to person, [place], [place]. Follows basic commands.   No resting tone. Sensory exam grossly intact. Cooperative  Musculoskeletal: no jt tenderness appreciated    Assessment/Plan: 1. Functional deficits which require 3+ hours per day of interdisciplinary therapy in a comprehensive inpatient rehab setting. Physiatrist is providing close team supervision and 24 hour management of  active medical problems listed below. Physiatrist and rehab team continue to assess barriers to discharge/monitor patient progress toward functional and medical goals  Care Tool:  Bathing    Body parts bathed by patient: Right arm, Left arm, Chest, Abdomen, Face   Body parts bathed by helper: Right arm, Left arm, Chest, Abdomen     Bathing assist Assist Level: Contact Guard/Touching assist     Upper Body Dressing/Undressing Upper body dressing   What is the patient wearing?: Pull over shirt    Upper body assist Assist Level: Supervision/Verbal cueing    Lower Body Dressing/Undressing Lower body dressing      What is the patient wearing?: Pants, Incontinence brief     Lower body assist Assist for lower body dressing: Maximal Assistance - Patient 25 - 49%     Toileting Toileting    Toileting assist Assist for toileting: 2 Helpers     Transfers Chair/bed transfer  Transfers assist     Chair/bed transfer assist level: Minimal Assistance - Patient > 75% Chair/bed transfer assistive device: Programmer, multimedia   Ambulation assist   Ambulation activity did not occur: Safety/medical concerns  Assist level: Minimal Assistance -  Patient > 75% Assistive device: Walker-rolling Max distance: 252 ft   Walk 10 feet activity   Assist  Walk 10 feet activity did not occur: Safety/medical concerns  Assist level: Minimal Assistance - Patient > 75% Assistive device: Walker-rolling   Walk 50 feet activity   Assist Walk 50 feet with 2 turns activity did not occur: Safety/medical concerns  Assist level: Minimal Assistance - Patient > 75% Assistive device: Walker-rolling    Walk 150 feet activity   Assist Walk 150 feet activity did not occur: Safety/medical concerns  Assist level: Minimal Assistance - Patient > 75% Assistive device: Walker-rolling    Walk 10 feet on uneven surface  activity   Assist Walk 10 feet on uneven surfaces activity  did not occur: Safety/medical concerns         Wheelchair     Assist Is the patient using a wheelchair?: Yes Type of Wheelchair: Manual Wheelchair activity did not occur: Safety/medical concerns (limited by cognitive deficits)  Wheelchair assist level: Supervision/Verbal cueing Max wheelchair distance: 57 ft    Wheelchair 50 feet with 2 turns activity    Assist    Wheelchair 50 feet with 2 turns activity did not occur: Safety/medical concerns   Assist Level: Supervision/Verbal cueing   Wheelchair 150 feet activity     Assist  Wheelchair 150 feet activity did not occur: Safety/medical concerns       Blood pressure 119/62, pulse 60, temperature 97.6 F (36.4 C), resp. rate 18, height 5\' 10"  (1.778 m), weight 85 kg, SpO2 96 %.    Medical Problem List and Plan: 1. 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  Status post posterior decompression with craniectomy 11/26/2021             -patient may not yet shower             -ELOS/Goals: 18-22 days, supervision to min assist goals- team conf in am   --Continue CIR therapies including PT, OT, and SLP  2.  Antithrombotics: -DVT/anticoagulation:  Pharmaceutical: Other (comment) Eliquis             -antiplatelet therapy: N/A 3. Pain Management: Tizanidine no longer ordered, Voltaren gel 4 times daily, Fioricet as needed, oxycodone as needed 4. Mood/sleep:                 -antipsychotic agents: seroquel               - sleep chart    - continue seroquel 50 mg qam and 150mg  qhs  -continue HS celexa for sleep and mood stabilization  -rx'ing UTI  -agitation improving. ABS only 15-21 yesterday    5. Neuropsych: This patient is not capable of making decisions on his own behalf.  -still requires soft wrist restraints for safety/equipiment-  6. Skin/Wound Care: Routine skin checks 7. Fluids/Electrolytes/Nutrition:   Continue foley for PEG  as long as it continues to function  -now on bolus feeds   - bolus schedule was adjusted, RD added further volume to TF 8.  Prolonged ventilatory support.  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     Decannulated 1/16---occlusive dressing to trach stoma-  closed 9.  Dysphagia.  Status post gastrostomy tube 12/13/2021 per Dr.Lovick. -continue NPO, trials with SLP. Repeat MBS today 2/1 10.  Aspiration pneumonia.  Antibiotic therapy completed.  -afebrile  -oob, oxygenation improved 11.  New onset atrial fibrillation.  Started Eliquis 12/17/2021.  Marland Kitchen Continue low-dose beta-blocker.  2/1 HR controlled Vitals:   01/16/22 0001 01/16/22 0401  BP: (!) 119/58 119/62  Pulse: 67 60  Resp:  18  Temp:  97.6 F (36.4 C)  SpO2: 95% 96%    12.  Hypertension.  Norvasc 10 mg daily, Cozaar 50 mg daily, , clonidine 0.1 mg every 8 hours.  ,  2/1 BP controlled , a little soft at times--dc clonidine 13.  Hyperlipidemia.  Lipitor 14.  History of BPH/urinary retention.        -generally has been voiding but is incontinent 15.  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  Follow-up per cardiology services. 16. 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 Has extensive hx per wife/son:    2/1 eliquis now on hold -rxing 100k E coli UTI with keflex  -hgb down 10.1--recheck 2/2  -appreciate urology consult.  options are pretty limited given hx   -no finasteride yet as it can't be crushed. Perhaps he'll be cleared to take meds today after MBS      -continue to track hgb, transfuse if needed    LOS: 22 days A FACE TO [place] 01/16/2022, 8:36 AM

## 2022-01-16 NOTE — Progress Notes (Signed)
Physical Therapy TBI Note  Patient Details  Name: [name] MRN: [id number] Date of Birth: 1946-10-28  Today's Date: 01/16/2022 PT Individual Time: 8003-4917 PT Individual Time Calculation (min): 31 min   Short Term Goals: Week 3:  PT Short Term Goal 1 (Week 3): Patient will perform basic transfers with min A consistently. PT Short Term Goal 2 (Week 3): Patient will ambulate mod-min A without overt LOB using LRAD >200 ft. PT Short Term Goal 3 (Week 3): Patient will perform 4 steps using 1 rail with min A of 1 person.   Skilled Therapeutic Interventions/Progress Updates:  Patient supine in bed on entrance to room. Patient alert and agreeable to PT session.   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 Relates she's not sure what he will be able to do.   Patient with no pain complaint throughout session.  Therapeutic Activity: Bed Mobility: Patient initiates supine --> sit impulsively prior to rail being put down. Is able to follow instructions to wait for rail then reaches seated position on EOB with supervision. Sees shoes and impulsively reaches forward with near LOB forward. Dons shoes with CGA/ close supervision for balance. At end of session, pt returns to supine with supervision.  Transfers: Patient performed squat pivot transfer from bed to w/c with mod cues for setup and pt performing with supervision. Sit<>stand  transfer performed impulsively requiring MinA to attain standing balance with no AD. With improved forward lean and slower rise to stand, performance improves with pt able to attain balance more quickly with light CGA.   Stand pivot transfers throughout session with or without RW requiring max cues for completing turn and reaching back for armrest prior to sitting down.   Gait Training:  Patient ambulated 100 ft using weighted RW with CGA/ MinA for walker mgmt and managing  forward lean. Demonstrated heavy forward lean into walker with walker held anterior to pt. Provided vc/ tc for upright posture, level gaze, improved proximity to walker, slowing pace. Pt fatigued following amb bout. Seated rest break provided prior to amb without RW. Pt able to perform 12' x1 including full stand pivot prior to descent to sit. Good upright posture demonstrated with slower pace. All with CGA and max cues for sequencing and balance throughout. Amb 26' x1 with no AD including two 90 deg turns and requires CGA/ MinA with pt reaching out for hand holds to doorway and foot of bed causing imbalance. VC for no handholds and pt reaches EOB following instructions for slow descent to sit. Wife pleased with pt's performance.   Patient supine  in bed at end of session with brakes locked, bed alarm set, and all needs within reach. No wrist restraints donned as wife in room. She relates she will alert staff when she leaves.      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   Agitated Behavior Scale: TBI Observation Details Observation  Environment: CIR Start of observation period - Date: 01/16/22 Start of observation period - Time: 1545 End of observation period - Date: 01/16/22 End of observation period - Time: 1615 Agitated Behavior Scale (DO NOT LEAVE BLANKS) Short attention span, easy distractibility, inability to concentrate: Present to a slight  degree Impulsive, impatient, low tolerance for pain or frustration: Present to a slight degree Uncooperative, resistant to care, demanding: Absent Violent and/or threatening violence toward people or property: Absent Explosive and/or unpredictable anger: Absent Rocking, rubbing, moaning, or other self-stimulating behavior: Absent Pulling at tubes, restraints, etc.: Absent Wandering from treatment areas: Absent Restlessness, pacing, excessive movement: Absent Repetitive behaviors, motor, and/or verbal: Absent Rapid, loud, or excessive talking: Absent Sudden changes of mood: Absent Easily initiated or excessive crying and/or laughter: Absent Self-abusiveness, physical and/or verbal: Absent Agitated behavior scale total score: 16   Therapy/Group: Individual Therapy  Alger Simons PT, DPT 01/16/2022, 4:22 PM

## 2022-01-16 NOTE — Progress Notes (Signed)
Nutrition Follow-up  DOCUMENTATION CODES:   Not applicable  INTERVENTION:   Continue Osmolite 1.5 cal formula via PEG. Bolus feed volume of 415 ml (1.75 cartons/ARCs, 7 cartons total per day) QID. 45 ml Prosource TF BID per tube.  free water flushes of 200 ml q 6 hours per tube.  Tube feeding regimen at goal to provide 2569 kcal, 126 grams protein, 2064 ml free water.  Upon discharge home, may substitute formula with Nutren 1.5 cal  NUTRITION DIAGNOSIS:   Inadequate oral intake related to inability to eat as evidenced by NPO status; ongoing  GOAL:   Patient will meet greater than or equal to 90% of their needs; met with TF  MONITOR:   Labs, Weight trends, TF tolerance, Skin, I & O's  REASON FOR ASSESSMENT:   Consult Enteral/tube feeding initiation and management  ASSESSMENT:   76 year old male with history of CAD status post CABG x4 2012, hyperlipidemia, nephrolithiasis, BPH/prostate cancer. Presented 11/25/2021 with altered mental status as well as dizziness with ataxic gait and diaphoretic. 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 Pt underwent suboccipital decompressive craniectomy 11/26/2021. Pt intubated for prolonged time underwent tracheostomy 12/10/2021 and PEG tube placement 12/13/2021. Pt with decreased functional mobility and admitted to CIR.  PEG placed prior to CIR admission 12/13/21 Trach decannulated 1/16.  Pt continues on NPO status. Pt has been tolerating his tube feeds. RD to continue with current tube feeding orders. LCSWA contacted RD to inform that Osmolite 1.5 cal is unable to be obtained upon discharge home due to back order stock. May substitute formula with Nutren 1.5 cal upon discharge home, which is a suitable substitute.   Labs and medications reviewed.   Diet Order:   Diet Order             Diet NPO time specified  Diet effective midnight                   EDUCATION NEEDS:   Not appropriate for education at this  time  Skin:  Skin Assessment: Reviewed RN Assessment Skin Integrity Issues:: Incisions Incisions: head  Last BM:  1/30  Height:   Ht Readings from Last 1 Encounters:  12/25/21 _0  (1.778 m)    Weight:   Wt Readings from Last 1 Encounters:  01/16/22 85 kg    BMI:  Body mass index is 26.89 kg/m.  Estimated Nutritional Needs:   Kcal:  2200-2400 kcal/d  Protein:  115-125 grams  Fluid:  >/= 2 L/day  Corrin Parker, MS, RD, LDN RD pager number/after hours weekend pager number on Amion.

## 2022-01-17 DIAGNOSIS — R319 Hematuria, unspecified: Secondary | ICD-10-CM

## 2022-01-17 DIAGNOSIS — I480 Paroxysmal atrial fibrillation: Secondary | ICD-10-CM

## 2022-01-17 LAB — CBC
HCT: 31.9 % — ABNORMAL LOW (ref 39.0–52.0)
Hemoglobin: 10.5 g/dL — ABNORMAL LOW (ref 13.0–17.0)
MCH: 30.5 pg (ref 26.0–34.0)
MCHC: 32.9 g/dL (ref 30.0–36.0)
MCV: 92.7 fL (ref 80.0–100.0)
Platelets: 277 10*3/uL (ref 150–400)
RBC: 3.44 MIL/uL — ABNORMAL LOW (ref 4.22–5.81)
RDW: 15.9 % — ABNORMAL HIGH (ref 11.5–15.5)
WBC: 6.1 10*3/uL (ref 4.0–10.5)
nRBC: 0 % (ref 0.0–0.2)

## 2022-01-17 LAB — COMPREHENSIVE METABOLIC PANEL
ALT: 27 U/L (ref 0–44)
AST: 20 U/L (ref 15–41)
Albumin: 2.6 g/dL — ABNORMAL LOW (ref 3.5–5.0)
Alkaline Phosphatase: 88 U/L (ref 38–126)
Anion gap: 8 (ref 5–15)
BUN: 15 mg/dL (ref 8–23)
CO2: 28 mmol/L (ref 22–32)
Calcium: 8.8 mg/dL — ABNORMAL LOW (ref 8.9–10.3)
Chloride: 103 mmol/L (ref 98–111)
Creatinine, Ser: 0.82 mg/dL (ref 0.61–1.24)
GFR, Estimated: >60 mL/min (ref >60)
Glucose, Bld: 102 mg/dL — ABNORMAL HIGH (ref 70–99)
Potassium: 4.3 mmol/L (ref 3.5–5.1)
Sodium: 139 mmol/L (ref 135–145)
Total Bilirubin: 0.4 mg/dL (ref 0.3–1.2)
Total Protein: 5.6 g/dL — ABNORMAL LOW (ref 6.5–8.1)

## 2022-01-17 LAB — GLUCOSE, CAPILLARY
Glucose-Capillary: 107 mg/dL — ABNORMAL HIGH (ref 70–99)
Glucose-Capillary: 107 mg/dL — ABNORMAL HIGH (ref 70–99)
Glucose-Capillary: 123 mg/dL — ABNORMAL HIGH (ref 70–99)
Glucose-Capillary: 136 mg/dL — ABNORMAL HIGH (ref 70–99)

## 2022-01-17 LAB — PREALBUMIN: Prealbumin: 25.9 mg/dL (ref 18–38)

## 2022-01-17 MED ORDER — JEVITY 1.5 CAL/FIBER PO LIQD
415.0000 mL | Freq: Four times a day (QID) | ORAL | Status: DC
  Administered 2022-01-17 – 2022-01-24 (×28): 415 mL
  Filled 2022-01-17 (×31): qty 474

## 2022-01-17 NOTE — Progress Notes (Signed)
PROGRESS NOTE   Subjective/Complaints: Was up a bit more last night. Still having gross blood, clots in urine without pain or retention.   ROS: Limited due to cognitive/behavioral    Objective:   No results found. Recent Labs    01/15/22 0520 01/17/22 0613  WBC 5.7 6.1  HGB 10.1* 10.5*  HCT 30.9* 31.9*  PLT 281 277    Recent Labs    01/17/22 0613  NA 139  K 4.3  CL 103  CO2 28  GLUCOSE 102*  BUN 15  CREATININE 0.82  CALCIUM 8.8*      Intake/Output Summary (Last 24 hours) at 01/17/2022 0854 Last data filed at 01/16/2022 1939 Gross per 24 hour  Intake 875 ml  Output 150 ml  Net 725 ml             Physical Exam: Vital Signs Blood pressure 119/62, pulse 61, temperature (!) 97.2 F (36.2 C), resp. rate 18, height 5\' 10"  (1.778 m), weight 85 kg, SpO2 97 %.   Constitutional: No distress . Vital signs reviewed. HEENT: NCAT, EOMI, oral membranes moist Neck: supple Cardiovascular: RRR without murmur. No JVD    Respiratory/Chest: CTA Bilaterally without wheezes or rales. Normal effort    GI/Abdomen: BS +, non-tender, non-distended Ext: no clubbing, cyanosis, or edema Psych: pleasant and cooperative  Neurologic: Cranial nerves II through XII intact, motor strength is 5/5 in bilateral deltoid, bicep, tricep, grip, hip flexor, knee extensors, ankle dorsiflexor and plantar flexor GU; no gross blood on penis, urine red Skin: No evidence of breakdown, no evidence of rash. PEG site CDI  Neuro:  . Aroused pretty easily.  Oriented to person, [place], [place]. Follows basic commands.   No resting tone. Sensory exam grossly intact. Cooperative  Musculoskeletal: no jt tenderness appreciated    Assessment/Plan: 1. Functional deficits which require 3+ hours per day of interdisciplinary therapy in a comprehensive inpatient rehab setting. Physiatrist is providing close team supervision and 24 hour management of  active medical problems listed below. Physiatrist and rehab team continue to assess barriers to discharge/monitor patient progress toward functional and medical goals  Care Tool:  Bathing    Body parts bathed by patient: Right arm, Left arm, Chest, Abdomen, Face   Body parts bathed by helper: Right arm, Left arm, Chest, Abdomen     Bathing assist Assist Level: Contact Guard/Touching assist     Upper Body Dressing/Undressing Upper body dressing   What is the patient wearing?: Pull over shirt    Upper body assist Assist Level: Supervision/Verbal cueing    Lower Body Dressing/Undressing Lower body dressing      What is the patient wearing?: Pants, Incontinence brief     Lower body assist Assist for lower body dressing: Maximal Assistance - Patient 25 - 49%     Toileting Toileting    Toileting assist Assist for toileting: 2 Helpers     Transfers Chair/bed transfer  Transfers assist     Chair/bed transfer assist level: Minimal Assistance - Patient > 75% Chair/bed transfer assistive device: Other (no AD)   Locomotion Ambulation   Ambulation assist   Ambulation activity did not occur: Safety/medical concerns  Assist level:  Minimal Assistance - Patient > 75% Assistive device: Walker-rolling Max distance: 252 ft   Walk 10 feet activity   Assist  Walk 10 feet activity did not occur: Safety/medical concerns  Assist level: Minimal Assistance - Patient > 75% Assistive device: No Device   Walk 50 feet activity   Assist Walk 50 feet with 2 turns activity did not occur: Safety/medical concerns  Assist level: Minimal Assistance - Patient > 75% Assistive device: Walker-rolling    Walk 150 feet activity   Assist Walk 150 feet activity did not occur: Safety/medical concerns  Assist level: Minimal Assistance - Patient > 75% Assistive device: Walker-rolling    Walk 10 feet on uneven surface  activity   Assist Walk 10 feet on uneven surfaces activity  did not occur: Safety/medical concerns         Wheelchair     Assist Is the patient using a wheelchair?: Yes Type of Wheelchair: Manual Wheelchair activity did not occur: Safety/medical concerns (limited by cognitive deficits)  Wheelchair assist level: Supervision/Verbal cueing Max wheelchair distance: 57 ft    Wheelchair 50 feet with 2 turns activity    Assist    Wheelchair 50 feet with 2 turns activity did not occur: Safety/medical concerns   Assist Level: Supervision/Verbal cueing   Wheelchair 150 feet activity     Assist  Wheelchair 150 feet activity did not occur: Safety/medical concerns       Blood pressure 119/62, pulse 61, temperature (!) 97.2 F (36.2 C), resp. rate 18, height 5\' 10"  (1.778 m), weight 85 kg, SpO2 97 %.    Medical Problem List and Plan: 1. 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  Status post posterior decompression with craniectomy 11/26/2021             -patient may not yet shower             -ELOS/Goals: 2/8, supervision to min assist goals- team conf in am   -Continue CIR therapies including PT, OT, and SLP   2.  Antithrombotics: -DVT/anticoagulation:  Pharmaceutical: Other (comment) Eliquis             -antiplatelet therapy: N/A 3. Pain Management: Tizanidine no longer ordered, Voltaren gel 4 times daily, Fioricet as needed, oxycodone as needed 4. Mood/sleep:                 -antipsychotic agents: seroquel               - sleep chart    - continue seroquel 50 mg qam and 150mg  qhs  -continue HS celexa for sleep and mood stabilization  -rx'ed UTI  2/2 -agitation improving. ABS only 16-18 yesterday    -still confused 5. Neuropsych: This patient is not capable of making decisions on his own behalf.  -still requires soft wrist restraints for safety/equipiment-  6. Skin/Wound Care: Routine skin checks 7. Fluids/Electrolytes/Nutrition:    Continue foley for PEG as long as it continues to function  - on bolus feeds   - bolus schedule was adjusted, RD added further volume to TF 8.  Prolonged ventilatory support.  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     Decannulated 1/16---occlusive dressing to trach stoma-  closed 9.  Dysphagia.  Status post gastrostomy tube 12/13/2021 per Dr.Lovick. -continue NPO, trials with SLP. Repeat MBS today 2/1 10.  Aspiration pneumonia.  Antibiotic therapy completed.  -afebrile  -oob, oxygenation improved 11.  New onset atrial fibrillation.  Started Eliquis 12/17/2021.  Marland Kitchen Continue low-dose beta-blocker.  2/2 HR controlled  -will need to d/w cardiology re: holding of eliquis, options given hematuria Vitals:   01/16/22 1927 01/17/22 0520  BP: 120/62 119/62  Pulse: 67 61  Resp: 17 18  Temp: 99.8 F (37.7 C) (!) 97.2 F (36.2 C)  SpO2: 98% 97%    12.  Hypertension.  Norvasc 10 mg daily, Cozaar 50 mg daily, , clonidine 0.1 mg every 8 hours.  ,  2/2 BP controlled. Clonidine stopped yesterday 13.  Hyperlipidemia.  Lipitor 14.  History of BPH/urinary retention.        -generally has been voiding but is incontinent 15.  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  Follow-up per cardiology services. 16. 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 Has extensive hx per wife/son:    2/2 eliquis now on hold -rxing 100k E coli UTI with keflex  -hgb stable to impropved at 10.5 today  -appreciate urology consult.  options are pretty limited given hx   -no finasteride yet as it can't be crushed. Perhaps he'll be cleared to take meds after MBS Friday  -urine appear to be clearing up over the last 48 hours    -continue to track hgb--recheck monday    LOS: 23 days A FACE TO [place] 01/17/2022, 8:54 AM

## 2022-01-17 NOTE — Progress Notes (Signed)
Cardiology has been reconsulted in regards to resuming Eliquis with persistent hematuria and will await plan of care.

## 2022-01-17 NOTE — Progress Notes (Signed)
Physical Therapy TBI Note  Patient Details  Name: [name] MRN: [id number] Date of Birth: 1946-12-03  Today's Date: 01/17/2022 PT Individual Time: 6063-0160 PT Individual Time Calculation (min): 56 min   Short Term Goals: Week 3:  PT Short Term Goal 1 (Week 3): Patient will perform basic transfers with min A consistently. PT Short Term Goal 2 (Week 3): Patient will ambulate mod-min A without overt LOB using LRAD >200 ft. PT Short Term Goal 3 (Week 3): Patient will perform 4 steps using 1 rail with min A of 1 person.  Skilled Therapeutic Interventions/Progress Updates:     Pt received seated in [facility] and agrees to therapy. No complaint of pain. Wife [name] and son [name] present for family education. PT answers family questions regarding managing mobility at home and safe strategies for mobilizing pt. WC transport to gym for time management. Pt performs to stand with PT providing minA and ambulates x25' with RW and minA, with cues for hand placement and slow gait speed. Pt's wife then practices providing assistance and pt ambulates x75' with same assist level. PT cues wife on hand placement and strategies for cueing pt to optimize safety during ambulation.  Pt performs car transfer with modA and cues for sequencing and positioning. Pt performs additional car transfer with wife providing assistance and PT cueing for positioning and safety.  Pt completes x3 6" steps holding onto R hand rail with both hands and PT providing modA for stability and cues for initiation and step sequencing. Pt's wife then provides assistance and pt performs additional x4 steps with same method.  WC transport back to room. PT provides minA for toilet transfer and transfer back to bed, with cues for hand placement, sequencing, and positioning. Pt left supine with alarm intact and all needs within reach.  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  Agitated Behavior Scale: TBI Observation Details Observation Environment: CIR Start of observation period - Date: 01/17/22 Start of observation period - Time: 1302 End of observation period - Date: 01/17/22 End of observation period - Time: 1400 Agitated Behavior Scale (DO NOT LEAVE BLANKS) Short attention span, easy distractibility, inability to concentrate: Present to a slight degree Impulsive, impatient, low tolerance for pain or frustration: Present to a slight degree Uncooperative, resistant to care, demanding: Absent Violent and/or threatening violence toward people or property: Absent Explosive and/or unpredictable anger: Absent Rocking, rubbing, moaning, or other self-stimulating behavior: Absent Pulling at tubes, restraints, etc.: Present to a slight degree Wandering from treatment areas: Absent Restlessness, pacing, excessive movement: Absent Repetitive behaviors, motor, and/or verbal: Absent Rapid, loud, or excessive talking: Absent Sudden changes of mood: Absent Easily initiated or excessive crying and/or laughter: Absent Self-abusiveness, physical and/or verbal: Absent Agitated behavior scale total score: 17   Therapy/Group: Individual Therapy  Breck Coons, PT, DPT 01/17/2022, 3:49 PM

## 2022-01-17 NOTE — Progress Notes (Addendum)
Progress Note  Patient Name: [name] Date of Encounter: 01/24/2022  [facility] Cardiologist: None   Subjective   Denies any chest pain, dyspnea, lightheadedness, or palpitations  Inpatient Medications    Scheduled Meds:  amLODipine  5 mg Per Tube Daily   apixaban  2.5 mg Per Tube BID   atorvastatin  80 mg Per Tube Daily   chlorhexidine  15 mL Mouth Rinse BID   citalopram  10 mg Per Tube QHS   diclofenac Sodium  2 g Topical QID   feeding supplement (JEVITY 1.5 CAL/FIBER)  415 mL Per Tube QID   feeding supplement (PROSource TF)  45 mL Per Tube BID   ferrous sulfate  220 mg Per Tube Q breakfast   fiber  1 packet Per Tube BID   free water  200 mL Per Tube QID   losartan  50 mg Per Tube Daily   pantoprazole sodium  40 mg Per Tube Daily   QUEtiapine  150 mg Per Tube Q0600   QUEtiapine  50 mg Per Tube Q0600   scopolamine  1 patch Transdermal Q72H   Continuous Infusions:  PRN Meds: acetaminophen **OR** acetaminophen (TYLENOL) oral liquid 160 mg/5 mL **OR** acetaminophen, butalbital-acetaminophen-caffeine, docusate, ipratropium-albuterol, melatonin, ondansetron, [DISCONTINUED] oxyCODONE **OR** oxyCODONE, polyethylene glycol, QUEtiapine   Vital Signs    Vitals:   01/23/22 1505 01/23/22 1936 01/24/22 0500 01/24/22 0514  BP: (!) 99/55 121/67  140/79  Pulse: (!) 59 72  73  Resp: 19 16  18   Temp: (!) 97.5 F (36.4 C) 97.8 F (36.6 C)  98.7 F (37.1 C)  TempSrc:  Oral    SpO2: 96% 100%  99%  Weight:   87.6 kg   Height:        Intake/Output Summary (Last 24 hours) at 01/24/2022 0656 Last data filed at 01/23/2022 0845 Gross per 24 hour  Intake 1200 ml  Output --  Net 1200 ml   Last 3 Weights 01/24/2022 01/23/2022 01/22/2022  Weight (lbs) 193 lb 2 oz 188 lb 4.4 oz 191 lb 12.8 oz  Weight (kg) 87.6 kg 85.4 kg 87 kg      Telemetry    Not on Telemetry- Personally Reviewed  ECG    12/18/2021: A-fib, rate 115, PVC- Personally Reviewed  Physical Exam   GEN: No acute  distress.   Neck: No JVD Cardiac: RRR, no murmurs, rubs, or gallops.  Respiratory: Clear to auscultation bilaterally. GI: Soft, nontender, non-distended  MS: No edema; No deformity. Neuro: Word finding difficulty Psych: Normal affect   Labs    High Sensitivity Troponin:  No results for input(s): TROPONINIHS in the last 720 hours.   Chemistry Recent Labs  Lab 01/21/22 0654  NA 140  K 3.8  CL 104  CO2 26  GLUCOSE 142*  BUN 19  CREATININE 0.90  CALCIUM 8.7*  GFRNONAA >60  ANIONGAP 10    Lipids No results for input(s): CHOL, TRIG, HDL, LABVLDL, LDLCALC, CHOLHDL in the last 168 hours.  Hematology Recent Labs  Lab 01/21/22 0654  WBC 6.8  RBC 3.77*  HGB 11.7*  HCT 35.9*  MCV 95.2  MCH 31.0  MCHC 32.6  RDW 16.5*  PLT 290   Thyroid No results for input(s): TSH, FREET4 in the last 168 hours.  BNPNo results for input(s): BNP, PROBNP in the last 168 hours.  DDimer No results for input(s): DDIMER in the last 168 hours.   Radiology    No results found.  Cardiac Studies  Patient Profile     76 y.o. male with history of CAD status post CABG in 2012, hyperlipidemia, prostate cancer, recent CVA, atrial flutter we are are consulted for evaluation of atrial flutter.  He was seen in the ED on 11/25/2021 for dizziness and code stroke was called.  MRI showed acute infarct in the cerebellum.  He received tPA.  He subsequently went into atrial flutter with RVR with heart rates in 140s to 150s.  Cardiology was consulted at that point.  Anticoagulation was deferred given his acute CVA treated with tPA.  Echocardiogram showed 70 to 75%, mild LV dilatation, grade 1 diastolic dysfunction, poor RV visualization but likely normal systolic function, mild MR.  On 12/13, he had worsening edema/mass effect and had to undergo emergent craniotomy.  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  Had prolonged intubation and underwent tracheostomy 12/26.  He was started on Eliquis on 1/2  for A-fib.  Neurology recommended repeat MRI in 1 to 2 months.  Course was also complicated by pneumonia and respiratory failure status post tracheostomy.  He was started on metoprolol 25 mg twice daily for his A-fib but developed bradycardia and metoprolol was discontinued.  He was discharged to inpatient rehab on 1/10.  His course has been complicated by hematuria for which urology was consulted.  Also had E. coli UTI.  Assessment & Plan    Atrial fibrillation with RVR: CHA2DS2-VASc score 5 (hypertension, age, CVA x2, CAD).  Echocardiogram 11/26/21 showed 70 to 75%, mild LV dilatation, grade 1 diastolic dysfunction, poor RV visualization but likely normal systolic function, mild MR -Complicated situation given recurrent hematuria.  Anticoagulation is recommended given his high CHA2DS2-VASc score and recent stroke.  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  Per urology note has been a recurrent issue for which he has had multiple evaluations at [facility].  Recommend trial of Eliquis 2.5 mg twice daily and will plan referral to Dr. Quentin Pham for Watchman left atrial appendage closure device.  He will need to be able to complete 45 days of anticoagulation after Watchman.  I did discuss this with Dr [name], and low dose Eliquis can be used for those 45 days, or ASA/plavix if unable to tolerate Eliquis -Not currently on AV nodal blocker, reportedly developed bradycardia with metoprolol previously.  [facility] Referral for Left Atrial Appendage Closure with Non-Valvular Atrial Fibrillation   [name] is a 76 y.o. male is being referred to the [facility] Team for evaluation for Left Atrial Appendage Closure with Watchman device for the management of stroke risk resulting form non-valvular atrial fibrillation.    Base upon [name]'s history, he is felt to be a poor candidate for long-term anticoagulation because of a history of bleeding  (e.g. intracerebral, subdural, GI, retro-peritoneal).  The patient has a HAS-BLED score of 4 indicating a Yearly Major Bleeding Risk of 8.7%.      His CHADS2-VASc Score is 5 with an unadjusted Ischemic Stroke Rate (% per year) of 7.2%.    His stroke risk necessitates a strategy of stroke prevention with either long-term oral anticoagulation or left atrial appendage occlusion therapy. We have discussed their bleeding risk in the context of their comorbid medical problems, as well as the rationale for referral for evaluation of Watchman left atrial appendage occlusion therapy. While the patient is at high long-term bleeding risk, they may be appropriate for short-term anticoagulation. Based on this individual patient's stroke and bleeding risk,  a shared decision has been made to refer the patient for consideration of Watchman left atrial appendage closure utilizing the [employer] of Cardiology shared decision tool.   For questions or updates, please contact [place] Please consult [email or web address] for contact info under        Signed, Donato Heinz, MD  01/24/2022, 6:56 AM

## 2022-01-17 NOTE — Progress Notes (Signed)
Physical Therapy TBI Note  Patient Details  Name: [name] MRN: [id number] Date of Birth: [birth year]  Today's Date: 01/17/2022 PT Individual Time: 1030-1055 PT Individual Time Calculation (min): 25 min   Short Term Goals: Week 1:  PT Short Term Goal 1 (Week 1): Patient will perform bed mobility with mod A in a flat bed without rails. PT Short Term Goal 1 - Progress (Week 1): Progressing toward goal PT Short Term Goal 2 (Week 1): Patient will perform basic transfers with mod A consistently. PT Short Term Goal 2 - Progress (Week 1): Progressing toward goal PT Short Term Goal 3 (Week 1): Patient will initiate gait training >10 feet. PT Short Term Goal 3 - Progress (Week 1): Met Week 2:  PT Short Term Goal 1 (Week 2): Pt will perform supine<>sit with mod assist consistently PT Short Term Goal 1 - Progress (Week 2): Met PT Short Term Goal 2 (Week 2): Pt will perform sit<>stand with mod assist of 1 consistently PT Short Term Goal 2 - Progress (Week 2): Met PT Short Term Goal 3 (Week 2): Pt will perform bed<>chair transfers with mod assist of 1 consistently PT Short Term Goal 3 - Progress (Week 2): Met PT Short Term Goal 4 (Week 2): Pt will ambulate at least 71ft using LRAD with mod assist of 1 and +2 min assist if needed PT Short Term Goal 4 - Progress (Week 2): Met Week 3:  PT Short Term Goal 1 (Week 3): Patient will perform basic transfers with min A consistently. PT Short Term Goal 2 (Week 3): Patient will ambulate mod-min A without overt LOB using LRAD >200 ft. PT Short Term Goal 3 (Week 3): Patient will perform 4 steps using 1 rail with min A of 1 person.  Skilled Therapeutic Interventions/Progress Updates:  Pt received seated in WC in room, nursing present finishing PEG feeding, wife also present. Pt denied pain and was agreeable to PT. Emphasis of session on practicing car transfers to prime for family training later in day. Pt transported to ortho gym w/total A for time management  and once in gym, pt suddenly became ill and projectile vomited his entire feed. Pt transported back to room w/total A to change. Pt very impulsive and attempted to stand from unlocked WC three times despite max cues from wife and therapist. Sit <>stand from [facility] w/CGA and pt performed stand pivot to EOB w/RW and min A for stabilization of RW. Once EOB, pt performed upper body dressing w/min A from wife due to pt attempting to put shirt on backwards. Sit <>supine w/S* and soiled shoes and socks removed w/total A. Pt was left supine in bed, bed alarm on but wrist restraints off, wife present and all needs in reach. Nursing notified of pt status.   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 Agitated Behavior Scale: TBI Observation Details Observation Environment: CIR Start of observation period - Date: 01/17/22 Start of observation period - Time: 1030 End of observation period - Date: 01/17/22 End of observation period - Time: 1055 Agitated Behavior Scale (DO NOT LEAVE BLANKS) Short attention span, easy distractibility, inability to concentrate: Present to a slight degree Impulsive, impatient, low tolerance for pain or frustration: Present to a slight degree Uncooperative, resistant to care, demanding: Absent Violent and/or  threatening violence toward people or property: Absent Explosive and/or unpredictable anger: Absent Rocking, rubbing, moaning, or other self-stimulating behavior: Absent Pulling at tubes, restraints, etc.: Absent Wandering from treatment areas: Absent Restlessness, pacing, excessive movement: Absent Repetitive behaviors, motor, and/or verbal: Absent Rapid, loud, or excessive talking:  Absent Sudden changes of mood: Absent Easily initiated or excessive crying and/or laughter: Absent Self-abusiveness, physical and/or verbal: Absent Agitated behavior scale total score: 16   Therapy/Group: Individual Therapy Cruzita Lederer Rasean Joos, PT, DPT  01/17/2022, 11:00 AM

## 2022-01-17 NOTE — Progress Notes (Signed)
Speech Language Pathology Daily Session Note  Patient Details  Name: [name] MRN: [id number] Date of Birth: [birth year]  Today's Date: 01/17/2022 SLP Individual Time: 2820-8138 SLP Individual Time Calculation (min): 29 min  Short Term Goals: Week 4: SLP Short Term Goal 1 (Week 4): STGs=LTGs due to ELOS  Skilled Therapeutic Interventions: Pt seen for skilled ST with focus on family education, wife and son present after OT ed session. SLP brought patient to sink to trial oral care with regular toothbrush, able to perform with min A cues for thoroughness. Pt able to swish and spit thin water with no difficulty. Pt initially requires mod A cues for use of swallow strategies with ice chips trials, primarily not talking during intake and double swallow, fading to min A cues. Pt with one large coughing episode with ice chips 2' talking before and during swallow. Wife and son providing appropriate cues for patient during trials, discussed ways to change dining habits if sent home on PO diet (I.e. avoiding conversation while patient actively eating, low stim environment, etc). Discussed previous MBS study and plan for MBS tomorrow. Discussed avoiding TV and playing music at home instead for entertainment as pt stiff has difficulty deciphering reality from dreams, etc. SLP provided strategies to increase communication of wants/needs at home and reduce frustration. Pt left in wheelchair and handed off to PT. Cont ST POC.   Pain Pain Assessment Pain Scale: 0-10 Pain Score: 0-No pain  Therapy/Group: Individual Therapy  [name] 01/17/2022, 3:20 PM

## 2022-01-17 NOTE — Progress Notes (Signed)
Patient ID: [name], male   DOB: [birth year], 76 y.o.   MRN: [id number]  SW met with pt, pt wife [name], and pt son Rob to discuss HHA preference. Prefers: 1) [employer], 2) Nurse, learning disability. SW shared updates on repeat MBS tomorrow, and will confirm if pt will require TF at discharge. SW shared possible barriers if pt begins diet and still requires TF in which TF will not be covered by insurance.   [name], MSW, [place] Office: [phone] Cell: [phone] Fax: [phone]

## 2022-01-17 NOTE — Progress Notes (Signed)
Nutrition Follow-up  DOCUMENTATION CODES:   Not applicable  INTERVENTION:   Change formula over to suitable substitute of Jevity 1.5 cal formula.  Initiate bolus feeds via PEG at starting volume of 237 ml and increase by 120 ml at each feeding until goal of 415 ml (1.75 cartons/ARCs, 7 cartons total per day) given QID is met.   Provide 45 ml Prosource TF BID per tube.   Free water flushes of 200 ml q 6 hours per tube.   Tube feeding regimen at goal to provide 2570 kcal, 128 grams protein, and 2064 ml free water.   Upon discharge home, may substitute formula with Nutren 1.5 cal if unable to obtain Jevity 1.5 cal.  NUTRITION DIAGNOSIS:   Inadequate oral intake related to inability to eat as evidenced by NPO status; ongoing  GOAL:   Patient will meet greater than or equal to 90% of their needs; met with TF  MONITOR:   Labs, Weight trends, TF tolerance, Skin, I & O's  REASON FOR ASSESSMENT:   Consult Enteral/tube feeding initiation and management  ASSESSMENT:   76 year old male with history of CAD status post CABG x4 2012, hyperlipidemia, nephrolithiasis, BPH/prostate cancer. Presented 11/25/2021 with altered mental status as well as dizziness with ataxic gait and diaphoretic. 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 Pt underwent suboccipital decompressive craniectomy 11/26/2021. Pt intubated for prolonged time underwent tracheostomy 12/10/2021 and PEG tube placement 12/13/2021. Pt with decreased functional mobility and admitted to CIR.  PEG placed prior to CIR admission 12/13/21 Trach decannulated 1/16.  Osmolite 1.5 cal formula in 237 ml ARC containers are on back order and out of stock. RD to switch formula over to suitable substitute formula available on current formula. Will change formula to Jevity 1.5 cal formula. Introduction and administration of new formula will initially be started low and slow to ensure GI tolerance. May substitute formula with  Nutren 1.5 cal upon discharge home, which is a suitable substitute, if unable to obtain Jevity 1.5 cal ARC container formula.   Labs and medications reviewed.   Diet Order:   Diet Order             Diet NPO time specified  Diet effective midnight                   EDUCATION NEEDS:   Not appropriate for education at this time  Skin:  Skin Assessment: Reviewed RN Assessment Skin Integrity Issues:: Incisions Incisions: head  Last BM:  1/30  Height:   Ht Readings from Last 1 Encounters:  12/25/21 _0  (1.778 m)    Weight:   Wt Readings from Last 1 Encounters:  01/16/22 85 kg    BMI:  Body mass index is 26.89 kg/m.  Estimated Nutritional Needs:   Kcal:  2200-2400 kcal/d  Protein:  115-125 grams  Fluid:  >/= 2 L/day  Corrin Parker, MS, RD, LDN RD pager number/after hours weekend pager number on Amion.

## 2022-01-17 NOTE — Progress Notes (Signed)
Physical Therapy TBI Note  Patient Details  Name: [name] MRN: [id number] Date of Birth: 05-12-1946  Today's Date: 01/17/2022 PT Individual Time: 0920-1000 PT Individual Time Calculation (min): 40 min   Short Term Goals: Week 3:  PT Short Term Goal 1 (Week 3): Patient will perform basic transfers with min A consistently. PT Short Term Goal 2 (Week 3): Patient will ambulate mod-min A without overt LOB using LRAD >200 ft. PT Short Term Goal 3 (Week 3): Patient will perform 4 steps using 1 rail with min A of 1 person.  Skilled Therapeutic Interventions/Progress Updates:  Pt received supine in bed with B UE soft wrist restraints on and pt slightly agitated by this when going to try and rub his eyes/scratch his nose and not able to reach. Pt agreeable to therapy session and therapist doffed restraints. Donned B LE knee high TED hose total assist for time. Supine>sitting L EOB, HOB slightly elevated and using bedrail with close supervision for safety and pt demoing mild truncal ataxia. Sitting EOB, donned tennis shoes with close supervision for trunk safety due to ataxia and quick, impulsive movements. Sit>stand EOB>9lb weighted RW with light min assist for steadying/balance due to truncal ataxia and ~21ft short distance ambulatory transfer to w/c - cuing to turn fully prior to initiating going to sit. Throughout session pt with poor recall/carryover to reach back for seat prior to initiating sit and rather ends up flipping walker back towards him when going to sit because he keeps holding onto it.  Transported to/from gym in w/c for time management and energy conservation.  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   Therapist removed weights from [place]. Pt says he feels like walker moves really fast but this was good feedback for pt because it  required him to slow his gait speed down and focus on more controlled B LE steps. Gait training ~1110ft using regular RW - pt demos more consistent B LE step lengths, step widths, and foot clearance resulting in decreased postural sway.  Kept no weight on RW remainder of session and reinforced importance of slow, controlled gait pattern.  Gait training ~25ft 2x to/from stairs using RW with light min assist to stairs and then heavier min assist upon returning towards w/c due to fatigue with pt stating he feels like he has some labored breathing after this.   Stair navigation ascending/descending 4 steps using B HRs with heavy min assist on ascent via reciprocal pattern and then mod assist during descent via step-to pattern with pt having trunk extension and posterior lean with mild knee buckling on final 2 stairs.  Retrieved +2 assist for safety and then repeated 4 stair navigation using B HRs again with reciprocal pattern on ascent and step-to on descent with pt requiring light min assist during ascent and heavier min assist during descent with therapist cuing pt to maintain forward trunk flexion (to prevent extended posture with posterior lean).   Transported back to room and pt left seated in w/c with needs in reach and family member present to provide supervision until next therapy session.   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  Agitated Behavior Scale: TBI  Observation Details Observation  Environment: CIR Start of observation period - Date: 01/17/22 Start of observation period - Time: 0920 End of observation period - Date: 01/17/22 End of observation period - Time: 1000 Agitated Behavior Scale (DO NOT LEAVE BLANKS) Short attention span, easy distractibility, inability to concentrate: Absent Impulsive, impatient, low tolerance for pain or frustration: Present to a slight degree Uncooperative, resistant to care, demanding: Absent Violent and/or threatening violence toward people or property: Absent Explosive and/or unpredictable anger: Absent Rocking, rubbing, moaning, or other self-stimulating behavior: Absent Pulling at tubes, restraints, etc.: Absent Wandering from treatment areas: Absent Restlessness, pacing, excessive movement: Absent Repetitive behaviors, motor, and/or verbal: Absent Rapid, loud, or excessive talking: Absent Sudden changes of mood: Absent Easily initiated or excessive crying and/or laughter: Absent Self-abusiveness, physical and/or verbal: Absent Agitated behavior scale total score: 15     Therapy/Group: Individual Therapy  Tawana Scale , PT, DPT, NCS, CSRS  01/17/2022, 7:45 AM

## 2022-01-17 NOTE — Progress Notes (Signed)
Occupational Therapy TBI Note  Patient Details  Name: [name] MRN: [id number] Date of Birth: 1946/11/15  Today's Date: 01/17/2022 OT Individual Time: 1302-1400 OT Individual Time Calculation (min): 58 min    Short Term Goals: Week 4:  OT Short Term Goal 1 (Week 4): STG = LTG d/t ELOS  Skilled Therapeutic Interventions/Progress Updates:     Pt greeted semi-reclined in bed with wife and son present for family education. Family education focused on hands on toilet and shower transfers and as well as bathing/dressing tasks at shower level. OT educated on body mechanics with transfers, use of gait belt for all transfers, safety awareness, how to cue patient, educated on ataxia, and general BI behaviors. Pt very impulsive and movements are very quick, so discussed a lot about anticipating patients movements. Stand-pivot transfers with RW or grab bars with overall min A. Tried side stepping into shower with more difficulty motor planning side step requiring mod A and multimodal cues. Educated on PEG site dressing, removal for shower and tips to keep patient from [place] with PEG side. Wife able to re-dress PEG site with education from OT after shower. Wife [name] did a great job of providing cuing and constant directions to patient. Educated on use of briefs, donning TED hose with friction reducing device, dressing tasks, and sit<>stands. Pt left seated in wc at end of session handoff to SLP for next therapy session.  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 Pain: Pain Assessment Pain Scale: 0-10 Pain Score: 0-No pain Agitated Behavior Scale: TBI Observation Details Observation Environment: CIR Start of  observation period - Date: 01/17/22 Start of observation period - Time: 1302 End of observation period - Date: 01/17/22 End of observation period - Time: 1400 Agitated Behavior Scale (DO NOT LEAVE BLANKS) Short attention span, easy distractibility, inability to concentrate: Present to a slight degree Impulsive, impatient, low tolerance for pain or frustration: Present to a slight degree Uncooperative, resistant to care, demanding: Absent Violent and/or threatening violence toward people or property: Absent Explosive and/or unpredictable anger: Absent Rocking, rubbing, moaning, or other self-stimulating behavior: Absent Pulling at tubes, restraints, etc.: Present to a slight degree Wandering from treatment areas: Absent Restlessness, pacing, excessive movement: Absent Repetitive behaviors, motor, and/or verbal: Absent Rapid, loud, or excessive talking: Absent Sudden changes of mood: Absent Easily initiated or excessive crying and/or laughter: Absent Self-abusiveness, physical and/or verbal: Absent Agitated behavior scale total score: 17   Therapy/Group: Individual Therapy  Valma Cava 01/17/2022, 3:29 PM

## 2022-01-18 ENCOUNTER — Inpatient Hospital Stay (HOSPITAL_COMMUNITY): Payer: Medicare Other

## 2022-01-18 LAB — GLUCOSE, CAPILLARY
Glucose-Capillary: 103 mg/dL — ABNORMAL HIGH (ref 70–99)
Glucose-Capillary: 107 mg/dL — ABNORMAL HIGH (ref 70–99)
Glucose-Capillary: 126 mg/dL — ABNORMAL HIGH (ref 70–99)

## 2022-01-18 MED ORDER — APIXABAN 2.5 MG PO TABS
2.5000 mg | ORAL_TABLET | Freq: Two times a day (BID) | ORAL | Status: DC
  Administered 2022-01-18 – 2022-01-24 (×13): 2.5 mg
  Filled 2022-01-18 (×13): qty 1

## 2022-01-18 NOTE — Progress Notes (Addendum)
Physical Therapy Session Note  Patient Details  Name: [name] MRN: [id number] Date of Birth: 08/30/1946  Today's Date: 01/18/2022 PT Individual Time: 1027-1100 PT Individual Time Calculation (min): 33 min  Missed PT time 27 min 2/2 at swallowing study. Short Term Goals: Week 3:  PT Short Term Goal 1 (Week 3): Patient will perform basic transfers with min A consistently. PT Short Term Goal 2 (Week 3): Patient will ambulate mod-min A without overt LOB using LRAD >200 ft. PT Short Term Goal 3 (Week 3): Patient will perform 4 steps using 1 rail with min A of 1 person.  Skilled Therapeutic Interventions/Progress Updates: Pt returned late from swallowing study, missing 91' of therapy.  Pt now presents supine in bed and agreeable to therapy.  Pt transferred sup to sit w/ supervision, but would reach for external assist if given.  Pt required total A to doff slipper socks 2/2 unable to bring feet to lap.  Pt able to put feet into shoes, but then mod A to complete.  Pt transfers sit to stand w/ RW and CGA, but verbal cueing for impulsivity.  Pt amb x 5' to w/c w/ min A and RW management w/ continued impulsivity.  Pt sat in w/c for BP monitoring at 99/58 w/ minimal c/o sx.  Pt sat x 3' then BP in standing at 76/52.  Consulted nursing and Okd staying up in w/c.  LE ther ex in sitting calf raises, LAQ, hip flexion 3 x10.  BP improved during sitting to 97/58.  Pt remained sitting in w/c w/ chair alarm on and all needs in reach, family member in room throughout session and will remain.  Nursing states no wrist restraints w/ family in room.     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

## 2022-01-18 NOTE — Progress Notes (Signed)
Dr. Gardiner Rhyme requested arrangement of EP f/u with Dr. Quentin Ore in 2-4 weeks to discuss Watchman. I have sent a message to our office's EP Scheduler requesting this, and our office will call the patient with this information.

## 2022-01-18 NOTE — Progress Notes (Signed)
Occupational Therapy Session Note  Patient Details  Name: [name] MRN: [id number] Date of Birth: 14-Jul-1946  Today's Date: 01/18/2022 OT Individual Time: 3200-3794 OT Individual Time Calculation (min): 40 min   Short Term Goals: Week 4:  OT Short Term Goal 1 (Week 4): STG = LTG d/t ELOS  Skilled Therapeutic Interventions/Progress Updates:    Pt greeted semi-reclined in bed with son present, but he was about to leave. Pt completed bed mobility with CGA. Stand-pivot to wc with Min A and use of gait belt. Pt brought to therapy gym with only one other patient, so the environment was as minimally distracting as possible. Addressed memory and fine motor skills using BITs. Pt able to recall up to 5 word sequence with only minimal cues. He then was able to hit the target for large correlating large picture with 65% accuracy using R hand. Continued working on R UE coordination using tracing activity on BITs graded to larger lines and shapes. Standing balance/endurance and UB there-ex using 3 lb dowel rod to perform 3 sets of 10 chest press, bicep curl, and one-arm punch out in standing. Min A for standing balance with increased task demand. Extended rest breaks in between sets. Pt returned to room and pivoted back to bed with CGA. Pt left sem-reclined in bed with call bell in reach, 4 rails up, alarm on, and needs met.    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

## 2022-01-18 NOTE — Progress Notes (Signed)
Modified Barium Swallow Progress Note  Patient Details  Name: Haruki Arnold MRN: [id number] Date of Birth: 1946-12-06  Today's Date: 01/18/2022  Modified Barium Swallow completed.  Full report located under Chart Review in the Imaging Section.  Brief recommendations include the following:  Clinical Impression  Patients overall swallowing function appears mildly improved since last MBS. Patient continues to demonstrate a moderate pharyngeal dysphagia characterized by reduced tongue base retraction and anterior laryngeal mobility resulting in decreased pharyngeal constriction and UES opening with moderate vallecular and mild pyriform sinus residue with thicker textures. Residue was reduced with thin liquids. Patient would intermittently utilize spontaneous swallows which would help reduce the residue, but it would intermittently spill over into his airway with intermittent sensation.  Patients overall cognitive deficits make it difficult for patient to utilize any compensatory strategy consistently. However, patient responded best to visual feedback from the [facility] monitor with cues to swallow hard and squeeze those muscles. Recommend patient remain NPO but continue trials of thin liquids via cup and puree textures (separately). Discussed with physician and the patients wife regarding aspiration risk as trials progress.    Swallow Evaluation Recommendations       SLP Diet Recommendations: NPO       Medication Administration: Via alternative means               Oral Care Recommendations: Oral care QID        Harald Quevedo 01/18/2022,4:04 PM

## 2022-01-18 NOTE — Progress Notes (Signed)
Discussed with Dr Naaman Plummer.  Complicated situation given recurrent hematuria.  Anticoagulation is recommended given his high CHA2DS2-VASc score and recent stroke.  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  Per urology note has been a recurrent issue for which he has had multiple evaluations at [facility].   -Will plan referral to Dr. Quentin Ore for Watchman left atrial appendage closure device.  He will need to be able to complete 45 days of anticoagulation after Watchman.  I did discuss this with Dr Quentin Ore, and low dose Eliquis can be used for those 45 days, or ASA/plavix if unable to tolerate Eliquis -Recommend starting Eliquis 2.5 mg BID.  If has recurrent hematuria, would switch to ASA 81 mg daily/plavix 75 mg daily.  Will plan f/u with Dr Quentin Ore for Watchman evaluation  [facility] will sign off.   Medication Recommendations:  Eliquis 2.5 mg BID.  If recurrent hematuria, would switch to ASA 81 mg daily/plavix 75 mg daily Other recommendations (labs, testing, etc):  None Follow up as an outpatient:  Will schedule with Dr. Quentin Ore for [facility] evaluation   For questions or updates, please contact [facility] Please consult [email or web address] for contact info under        Signed, Donato Heinz, MD  01/18/2022, 9:04 AM

## 2022-01-18 NOTE — Progress Notes (Addendum)
Patient ID: [name], male   DOB: 18-Apr-1946, 76 y.o.   MRN: [id number]  SW spoke with [name] with [facility] [phone]) to discuss if referral was received. Reports will have intake Nurse f/u on if received and if able to accept.  *SW received updates from [name] to discuss referral and was informed unable to accept referral due to being over capacity and their hospital patients are priority.   SW sent referral to [name]/[facility] and waiting on follow-up.   *SW received updates from medical team to extend pt d/c date to 2/15 to allow more functional gains and improve transfers. SW spoke with [name] [phone]) to discuss. She is aware and pleased with extension since she is nervous about him coming home. SW discussed above updates about HH, and will f/u once there is more information. Fam edu scheduled for Friday (2/10) 1pm-4pm.   *SW received updates from [facility] referral accepted.SW updated [name]/[facility] enteral feed dept [phone] option #5, ext [phone]) on change in d/c date. SW will send order for TF on next Friday to ensure no further changes. Reports Jevity 1.5 is on back order. Pt TF are covered at 100%.  [name], MSW, [place] Office: [phone] Cell: [phone] Fax: [phone]

## 2022-01-18 NOTE — Progress Notes (Signed)
Occupational Therapy Session Note  Patient Details  Name: [name] MRN: [id number] Date of Birth: 05-01-1946  Today's Date: 01/18/2022 OT Individual Time: 0730-0830 OT Individual Time Calculation (min): 60 min    Short Term Goals: Week 4:  OT Short Term Goal 1 (Week 4): STG = LTG d/t ELOS  Skilled Therapeutic Interventions/Progress Updates:    Pt supine with no c/o pain, alert and eager to get started. Pt completed bed mobility to EOB with min cueing for impulsivity. Pt oriented to place and general time but not correct (2022 vs 2023 and January). Pt completed ambulatory transfer to the w/c with min A, frequent balance perturbations requiring skilled handling for safety. Pt completed shaving task at the sink with mod A for thoroughness using an Copy. Pt then completed functional mobility to the therapy gym, with min A, occasional mod A for balance perturbations. In the gym pt completed several resistive training and dynamic balance/coordination activities from the mat. Min-mod A for standing balance support. Pt able to hold 3lb dumbbells in BUE with improvement in coordination with weights. Pt was left supine with all needs met, bed alarm set.   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

## 2022-01-18 NOTE — Progress Notes (Signed)
PROGRESS NOTE   Subjective/Complaints: Pt had reasonable night. Up early with therapies this morning.   ROS: Patient denies fever, rash, sore throat, blurred vision, dizziness, nausea, vomiting, diarrhea, cough, shortness of breath or chest pain, joint or back/neck pain,  or mood change.    Objective:   No results found. Recent Labs    01/17/22 0613  WBC 6.1  HGB 10.5*  HCT 31.9*  PLT 277    Recent Labs    01/17/22 0613  NA 139  K 4.3  CL 103  CO2 28  GLUCOSE 102*  BUN 15  CREATININE 0.82  CALCIUM 8.8*      Intake/Output Summary (Last 24 hours) at 01/18/2022 1014 Last data filed at 01/18/2022 0450 Gross per 24 hour  Intake --  Output 300 ml  Net -300 ml             Physical Exam: Vital Signs Blood pressure (!) 129/59, pulse 61, temperature 98.9 F (37.2 C), temperature source Oral, resp. rate 16, height 5\' 10"  (1.778 m), weight 85 kg, SpO2 97 %.   Constitutional: No distress . Vital signs reviewed. HEENT: NCAT, EOMI, oral membranes moist Neck: supple Cardiovascular: RRR without murmur. No JVD    Respiratory/Chest: CTA Bilaterally without wheezes or rales. Normal effort    GI/Abdomen: BS +, non-tender, non-distended Ext: no clubbing, cyanosis, or edema Psych: pleasant and cooperative  Neurologic: Cranial nerves II through XII intact, motor strength is 5/5 in bilateral deltoid, bicep, tricep, grip, hip flexor, knee extensors, ankle dorsiflexor and plantar flexor GU; urine yellow, clear Skin: No evidence of breakdown, no evidence of rash. PEG site CDI  Neuro:  .alert, oriented to person [facility], follows basic commands. No resting tone. Sensory exam grossly intact. Cooperative  Musculoskeletal: no jt pain   Assessment/Plan: 1. Functional deficits which require 3+ hours per day of interdisciplinary therapy in a comprehensive inpatient rehab setting. Physiatrist is providing close team  supervision and 24 hour management of active medical problems listed below. Physiatrist and rehab team continue to assess barriers to discharge/monitor patient progress toward functional and medical goals  Care Tool:  Bathing    Body parts bathed by patient: Right arm, Left arm, Chest, Abdomen, Face   Body parts bathed by helper: Right arm, Left arm, Chest, Abdomen     Bathing assist Assist Level: Contact Guard/Touching assist     Upper Body Dressing/Undressing Upper body dressing   What is the patient wearing?: Pull over shirt    Upper body assist Assist Level: Supervision/Verbal cueing    Lower Body Dressing/Undressing Lower body dressing      What is the patient wearing?: Pants, Incontinence brief     Lower body assist Assist for lower body dressing: Maximal Assistance - Patient 25 - 49%     Toileting Toileting    Toileting assist Assist for toileting: 2 Helpers     Transfers Chair/bed transfer  Transfers assist     Chair/bed transfer assist level: Minimal Assistance - Patient > 75% Chair/bed transfer assistive device: Programmer, multimedia   Ambulation assist   Ambulation activity did not occur: Safety/medical concerns  Assist level: Minimal Assistance - Patient >  75% Assistive device: Walker-rolling Max distance: 252 ft   Walk 10 feet activity   Assist  Walk 10 feet activity did not occur: Safety/medical concerns  Assist level: Minimal Assistance - Patient > 75% Assistive device: No Device   Walk 50 feet activity   Assist Walk 50 feet with 2 turns activity did not occur: Safety/medical concerns  Assist level: Minimal Assistance - Patient > 75% Assistive device: Walker-rolling    Walk 150 feet activity   Assist Walk 150 feet activity did not occur: Safety/medical concerns  Assist level: Minimal Assistance - Patient > 75% Assistive device: Walker-rolling    Walk 10 feet on uneven surface  activity   Assist Walk 10  feet on uneven surfaces activity did not occur: Safety/medical concerns         Wheelchair     Assist Is the patient using a wheelchair?: Yes Type of Wheelchair: Manual Wheelchair activity did not occur: Safety/medical concerns (limited by cognitive deficits)  Wheelchair assist level: Supervision/Verbal cueing Max wheelchair distance: 57 ft    Wheelchair 50 feet with 2 turns activity    Assist    Wheelchair 50 feet with 2 turns activity did not occur: Safety/medical concerns   Assist Level: Supervision/Verbal cueing   Wheelchair 150 feet activity     Assist  Wheelchair 150 feet activity did not occur: Safety/medical concerns       Blood pressure (!) 129/59, pulse 61, temperature 98.9 F (37.2 C), temperature source Oral, resp. rate 16, height 5\' 10"  (1.778 m), weight 85 kg, SpO2 97 %.    Medical Problem List and Plan: 1. 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  Status post posterior decompression with craniectomy 11/26/2021             -patient may not yet shower             -ELOS/Goals: given ongoing mobility/safety needs we have extended his LOS to 2/15.  supervision to min assist goals   -Continue CIR therapies including PT, OT, and SLP    2.  Antithrombotics: -DVT/anticoagulation:  Pharmaceutical: Other (comment) Eliquis             -antiplatelet therapy: N/A 3. Pain Management: Tizanidine no longer ordered, Voltaren gel 4 times daily, Fioricet as needed, oxycodone as needed 4. Mood/sleep:                 -antipsychotic agents: seroquel               - sleep chart    - continue seroquel 50 mg qam and 150mg  qhs  -continue HS celexa for sleep and mood stabilization  -rx'ed UTI  2/3 -agitation improving. ABS only 15-17 yesterday 5. Neuropsych: This patient is not capable of making decisions on his own behalf.  -still requires soft wrist restraints for  safety/equipiment-  6. Skin/Wound Care: Routine skin checks 7. Fluids/Electrolytes/Nutrition:   Continue foley for PEG as long as it continues to function. If he needs it long term will have balloon bumper feeding tube inserted  - on bolus feeds   - bolus schedule was adjusted, RD added further volume to TF 8.  Prolonged ventilatory support.  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     Decannulated 1/16---occlusive dressing to trach stoma-  closed 9.  Dysphagia.  Status post gastrostomy tube 12/13/2021 per Dr.Lovick-  -continue NPO, trials with SLP. Repeat MBS 2/3 pending 10.  Aspiration pneumonia.  Antibiotic  therapy completed.  -afebrile  -oob, oxygenation improved 11.  New onset atrial fibrillation.  Started Eliquis 12/17/2021.  Marland Kitchen Continue low-dose beta-blocker.  2/3 HR controlled  -appreciate cardiology consult. Discussed case with Dr. Gardiner Rhyme today. Resuming eliquis 2.5mg  bid  -asa/plavix if he starts to bleed again  -potential Watchman LA appendage closure device placement? Vitals:   01/17/22 1943 01/18/22 0350  BP: 117/60 (!) 129/59  Pulse: 65 61  Resp: 18 16  Temp: 98.8 F (37.1 C) 98.9 F (37.2 C)  SpO2: 98% 97%    12.  Hypertension.  Norvasc 10 mg daily, Cozaar 50 mg daily, , clonidine 0.1 mg every 8 hours.  ,  2/3 BP controlled. Clonidine stopped yesterday 13.  Hyperlipidemia.  Lipitor 14.  History of BPH/urinary retention.        -generally has been voiding but is incontinent 15.  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  Follow-up per cardiology services. 16. 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 Has extensive hx per wife/son:    2/3 eliquis resumed at low dose -  100k E coli UTI was rx'ed with keflex  -hgb stable to impropved at 10.5 2/2  -urology has seen, ?finasteride if can take po  -urine has been clear for 48+ hours    -continue to track hgb--recheck monday    LOS: 24 days A FACE TO [place] 01/18/2022, 10:14 AM

## 2022-01-19 NOTE — Progress Notes (Signed)
Speech Language Pathology Daily Session Note  Patient Details  Name: [name] MRN: [id number] Date of Birth: 1946-05-04  Today's Date: 01/19/2022 SLP Individual Time: 0930-1000 SLP Individual Time Calculation (min): 30 min  Short Term Goals: Week 4: SLP Short Term Goal 1 (Week 4): STGs=LTGs due to ELOS  Skilled Therapeutic Interventions: Skilled ST treatment focused on swallowing goals. Patient performed oral care using suction toothette and sup A for thoroughness. Patient consumed small-to-average sized cup sips of thin liquid (water only) x8 with verbal cues to "swallow hard" and "squeeze those muscles." Pt exhibited delayed cough during 30% of trials. Implementation of secondary swallows appeared to minimize incidence of coughing. Understand pt still at risk for silent aspiration per yesterday's MBS with intermittent sensation to airway invasion. Patient required moderate verbal cues for implementing swallowing strategies including taking small sips and secondary swallow. SLP provided education on results of MBS, as patient expressed "I can eat food now" and  "they removed my (feeding) tube" (this is inaccurate). Pt initially did not accept SLP's verbal explanations regarding swallow function and stated "well you're wrong", however receptiveness improved when sister arrived and provided reassurance. Patient was left in bed left sem-reclined in bed with call bell in reach, 4 rails up, alarm on, and needs met. Sister remained at bedside. Continue per current plan of care.      Pain No pain  Therapy/Group: Individual Therapy  Larrie Fraizer T Clifford Benninger 01/19/2022, 8:03 AM

## 2022-01-19 NOTE — Progress Notes (Signed)
Occupational Therapy Session Note  Patient Details  Name: Henrry Feil MRN: [id number] Date of Birth: 22-May-1946  Today's Date: 01/19/2022 OT Individual Time: 7048-8891 OT Individual Time Calculation (min): 73 min   17 ABS Short Term Goals: Week 5:     Skilled Therapeutic Interventions/Progress Updates:     Pt received in bed with no pain.  BP with teds HOB elvated: 115/75 BP after toileting and dress: 146/75  ADL: Pt completes ADL at overall dressing at sit to stand level at the sink and toileting with grab bar via SPT with MIN A and WB into R knee to prevent extension Level. Pt able to change shirt with set up, pants with MOD A for threading 1LE and balance during pants advancement past hips with facilitation of sit to stand as stated before. Pt declines bathing at this time. Pt continues to requires skilled faciltiation of functional mobility d/t  ataxia and impulsivity. Pt does bed with target in front of patient to facilitate weight shift forward to improve   Therapeutic exercise  Standing at table top for plank/pushup variation for WB Into all 4 extremities, coordination and strengthening: Plank: 30 second Push ups x10 Shoulder taps alternating Plank bird dog, UE only 3 rounds  Vitals after 2nd round: HR 90, O2 100, BP 151/81  3x15 squat to tap chair depth at parallel bars progressing to RUE only for WB and increased balance challenge. 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

## 2022-01-19 NOTE — Progress Notes (Signed)
Speech Language Pathology Daily Session Note  Patient Details  Name: [name] MRN: [id number] Date of Birth: 03-27-1946  Today's Date: 01/19/2022 SLP Individual Time: 1416-1440 SLP Individual Time Calculation (min): 24 min  Short Term Goals: Week 4: SLP Short Term Goal 1 (Week 4): STGs=LTGs due to ELOS  Skilled Therapeutic Interventions: Pt seen for skilled ST with focus on swallowing goals, pt in bed finishing tube feeds with nursing and wife present throughout. SLP assisting patient with oral care via suction toothbrush, wife very appreciative of education on why oral care is so important for patient throughout the day, especially before trials. Trials initiated with ice chips which patient tolerate without s/s aspiration 2/3 occasions. Pt with one large coughing episode, required mod cues to utilize double swallow, "squeeze those muscles" and not talk during intake. At this time, pt with emergent bathroom needs which ended session 5 minutes early. Pt left in care of NT, cont ST POC.   Pain Pain Assessment Pain Scale: 0-10 Pain Score: 0-No pain  Therapy/Group: Individual Therapy  Dewaine Conger 01/19/2022, 3:42 PM

## 2022-01-19 NOTE — Progress Notes (Signed)
Physical Therapy Session Note  Patient Details  Name: [name] MRN: [id number] Date of Birth: May 20, 1946  Today's Date: 01/19/2022 PT Individual Time: 1106-1200 PT Individual Time Calculation (min): 54 min   Short Term Goals:  Week 3:  PT Short Term Goal 1 (Week 3): Patient will perform basic transfers with min A consistently. PT Short Term Goal 2 (Week 3): Patient will ambulate mod-min A without overt LOB using LRAD >200 ft. PT Short Term Goal 3 (Week 3): Patient will perform 4 steps using 1 rail with min A of 1 person.  Skilled Therapeutic Interventions/Progress Updates:   Pt received supine in bed and agreeable to PT.  PT performed orthostatic VS.   Supine: 98/54. HR 64.  Sitting 97/55. HR 67 Standing 87/53, HR 72. No orthostatic s/s.   Supine>sit transfer with supervision assist from PT. Donning shoes sitting EOB with total A for safety and time management.   Pt reports need for urination. Ambulatory transfer to bathroom and from with RW and min assist for safety. Pt able void bladder sitting on BSC over toilet.   Pt transported to rehab gym in [facility]. Gait training with RW x 18f with min assist for safety and ataxia. BP taken following gait sitting in WC 120/64 HR 72.   Seated NMR UE coordination:  Attempted to catch ball, but coordination deficits limit safety of catch with RUE and trunkal ataxia noted. Ball tap with BUE R and L x 10 bil and vertical with target on each x 10.   Lateral reach and toss to one of 3 targets on floor x 12 seated x 12 standing. Min assist in standing and set up assist sitting to prevent knocking over bucket.   Throughout session min assist for sit<>stand transfers with RW.   [facility] and all needs met.         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

## 2022-01-19 NOTE — Progress Notes (Signed)
PROGRESS NOTE   Subjective/Complaints: Receiving medications from nursing Has no complaints Sister visiting Tolerated therapy well  ROS: Patient denies fever, rash, sore throat, blurred vision, dizziness, nausea, vomiting, diarrhea, cough, shortness of breath or chest pain, joint or back/neck pain,  or mood change.    Objective:   DG Swallowing Func-Speech Pathology  Result Date: 01/18/2022 Table formatting from the original result was not included. Objective Swallowing Evaluation: Type of Study: MBS-Modified Barium Swallow Study  Patient Details Name: [name] MRN: [id number] Date of Birth: [birth year] Today's Date: 01/18/2022 Past Medical History: Past Medical History: Diagnosis Date  CAD (coronary artery disease)   a. s/p CABG in 2012  Hyperlipidemia  Past Surgical History: Past Surgical History: Procedure Laterality Date  CORONARY ARTERY BYPASS GRAFT  2012  ESOPHAGOGASTRODUODENOSCOPY N/A 12/13/2021  Procedure: ESOPHAGOGASTRODUODENOSCOPY (EGD);  Surgeon: Jesusita Oka, MD;  Location: [facility] ENDOSCOPY;  Service: General;  Laterality: N/A;  PEG PLACEMENT N/A 12/13/2021  Procedure: PERCUTANEOUS ENDOSCOPIC GASTROSTOMY (PEG) PLACEMENT;  Surgeon: Jesusita Oka, MD;  Location: [place] ENDOSCOPY;  Service: General;  Laterality: N/A;  SUBOCCIPITAL CRANIECTOMY CERVICAL LAMINECTOMY N/A 11/26/2021  Procedure: SUBOCCIPITAL CRANIECTOMY;  Surgeon: Judith Part, MD;  Location: [place];  Service: Neurosurgery;  Laterality: N/A; HPI: See H&P  Subjective: alert  Recommendations for follow up therapy are one component of a multi-disciplinary discharge planning process, led by the attending physician.  Recommendations may be updated based on patient status, additional functional criteria and insurance authorization. Assessment / Plan / Recommendation Clinical Impressions 01/18/2022 Clinical Impression Patients overall swallowing function appears mildly improved  since last MBS. Patient continues to demonstrate a moderate pharyngeal dysphagia characterized by reduced tongue base retraction and anterior laryngeal mobility resulting in decreased pharyngeal constriction and UES opening with moderate vallecular and mild pyriform sinus residue with thicker textures. Residue was reduced with thin liquids. Patient would intermittently utilize spontaneous swallows which would help reduce the residue, but it would intermittently spill over into his airway with intermittent sensation.  Patients overall cognitive deficits make it difficult for patient to utilize any compensatory strategy consistently. However, patient responded best to visual feedback from the [facility] monitor with cues to swallow hard and squeeze those muscles. Recommend patient remain NPO but continue trials of thin liquids via cup and puree textures (separately). Discussed with physician and the patients wife regarding aspiration risk as trials progress. Both verbalized understanding and agreement. SLP Visit Diagnosis Dysphagia, pharyngeal phase (R13.13) Attention and concentration deficit following -- Frontal lobe and executive function deficit following -- Impact on safety and function Moderate aspiration risk   Treatment Recommendations 01/18/2022 Treatment Recommendations Therapy as outlined in treatment plan below   Prognosis 01/18/2022 Prognosis for Safe Diet Advancement Fair Barriers to Reach Goals Cognitive deficits;Severity of deficits Barriers/Prognosis Comment -- Diet Recommendations 01/18/2022 SLP Diet Recommendations NPO Liquid Administration via -- Medication Administration Via alternative means Compensations -- Postural Changes --   Other Recommendations 01/18/2022 Recommended Consults -- Oral Care Recommendations Oral care QID Other Recommendations -- Follow Up Recommendations -- Assistance recommended at discharge -- Functional Status Assessment -- Frequency and Duration  01/02/2022 Speech Therapy Frequency  (ACUTE ONLY) min 3x week Treatment Duration 3 weeks  Oral Phase 01/18/2022 Oral Phase WFL Oral - Pudding Teaspoon -- Oral - Pudding Cup -- Oral - Honey Teaspoon -- Oral - Honey Cup -- Oral - Nectar Teaspoon -- Oral - Nectar Cup -- Oral - Nectar Straw -- Oral - Thin Teaspoon -- Oral - Thin Cup -- Oral - Thin Straw -- Oral - Puree -- Oral - Mech Soft -- Oral - Regular -- Oral - Multi-Consistency -- Oral - Pill -- Oral Phase - Comment --  Pharyngeal Phase 01/18/2022 Pharyngeal Phase Impaired Pharyngeal- Pudding Teaspoon -- Pharyngeal -- Pharyngeal- Pudding Cup -- Pharyngeal -- Pharyngeal- Honey Teaspoon -- Pharyngeal -- Pharyngeal- Honey Cup -- Pharyngeal -- Pharyngeal- Nectar Teaspoon NT Pharyngeal -- Pharyngeal- Nectar Cup Delayed swallow initiation-vallecula;Delayed swallow initiation-pyriform sinuses;Reduced pharyngeal peristalsis;Reduced anterior laryngeal mobility;Reduced tongue base retraction;Penetration/Aspiration during swallow;Penetration/Apiration after swallow Pharyngeal Material enters airway, remains ABOVE vocal cords and not ejected out Pharyngeal- Nectar Straw Delayed swallow initiation-vallecula;Delayed swallow initiation-pyriform sinuses;Reduced pharyngeal peristalsis;Reduced anterior laryngeal mobility;Reduced tongue base retraction;Penetration/Aspiration during swallow;Penetration/Apiration after swallow Pharyngeal Material enters airway, passes BELOW cords without attempt by patient to eject out (silent aspiration) Pharyngeal- Thin Teaspoon Reduced pharyngeal peristalsis;Reduced anterior laryngeal mobility;Reduced tongue base retraction;Delayed swallow initiation-vallecula;Delayed swallow initiation-pyriform sinuses Pharyngeal Material does not enter airway Pharyngeal- Thin Cup Delayed swallow initiation-vallecula;Delayed swallow initiation-pyriform sinuses;Reduced pharyngeal peristalsis;Reduced anterior laryngeal mobility;Reduced tongue base retraction;Penetration/Aspiration during swallow  Pharyngeal Material enters airway, CONTACTS cords and then ejected out;Material does not enter airway;Material enters airway, passes BELOW cords without attempt by patient to eject out (silent aspiration) Pharyngeal- Thin Straw Delayed swallow initiation-vallecula;Delayed swallow initiation-pyriform sinuses;Reduced pharyngeal peristalsis;Reduced anterior laryngeal mobility;Penetration/Aspiration during swallow;Reduced tongue base retraction;Penetration/Apiration after swallow Pharyngeal Material enters airway, passes BELOW cords and not ejected out despite cough attempt by patient;Material enters airway, passes BELOW cords then ejected out Pharyngeal- Puree Delayed swallow initiation-pyriform sinuses;Delayed swallow initiation-vallecula;Reduced pharyngeal peristalsis;Reduced anterior laryngeal mobility;Reduced tongue base retraction Pharyngeal Material does not enter airway Pharyngeal- Mechanical Soft -- Pharyngeal -- Pharyngeal- Regular -- Pharyngeal -- Pharyngeal- Multi-consistency -- Pharyngeal -- Pharyngeal- Pill -- Pharyngeal -- Pharyngeal Comment --  Cervical Esophageal Phase  01/02/2022 Cervical Esophageal Phase WFL Pudding Teaspoon -- Pudding Cup -- Honey Teaspoon -- Honey Cup -- Nectar Teaspoon -- Nectar Cup -- Nectar Straw -- Thin Teaspoon -- Thin Cup -- Thin Straw -- Puree -- Mechanical Soft -- Regular -- Multi-consistency -- Pill -- Cervical Esophageal Comment -- PAYNE, COURTNEY 01/18/2022, 4:06 PM        Weston Anna, MA, CCC-SLP              Recent Labs    01/17/22 0613  WBC 6.1  HGB 10.5*  HCT 31.9*  PLT 277    Recent Labs    01/17/22 0613  NA 139  K 4.3  CL 103  CO2 28  GLUCOSE 102*  BUN 15  CREATININE 0.82  CALCIUM 8.8*      Intake/Output Summary (Last 24 hours) at 01/19/2022 1631 Last data filed at 01/19/2022 1500 Gross per 24 hour  Intake --  Output 450 ml  Net -450 ml             Physical Exam: Vital Signs Blood pressure 110/65, pulse 61, temperature 97.9 F  (36.6 C), temperature source Oral, resp. rate 20, height 5\' 10"  (1.778 m), weight 85.3 kg, SpO2 100 %.  Gen: no distress, normal appearing HEENT: oral mucosa pink and moist, NCAT Cardio: Reg rate Chest: normal effort, normal rate of breathing Abd: soft, non-distended Ext: no edema Psych: pleasant, normal affect Skin:  intact  Neurologic: Cranial nerves II through XII intact, motor strength is 5/5 in bilateral deltoid, bicep, tricep, grip, hip flexor, knee extensors, ankle dorsiflexor and plantar flexor GU; urine yellow, clear Skin: No evidence of breakdown, no evidence of rash. PEG site CDI  Neuro:  .alert, oriented to person [facility], follows basic commands. No resting tone. Sensory exam grossly intact. Cooperative  Musculoskeletal: no jt pain   Assessment/Plan: 1. Functional deficits which require 3+ hours per day of interdisciplinary therapy in a comprehensive inpatient rehab setting. Physiatrist is providing close team supervision and 24 hour management of active medical problems listed below. Physiatrist and rehab team continue to assess barriers to discharge/monitor patient progress toward functional and medical goals  Care Tool:  Bathing    Body parts bathed by patient: Right arm, Left arm, Chest, Abdomen, Face   Body parts bathed by helper: Right arm, Left arm, Chest, Abdomen     Bathing assist Assist Level: Contact Guard/Touching assist     Upper Body Dressing/Undressing Upper body dressing   What is the patient wearing?: Pull over shirt    Upper body assist Assist Level: Supervision/Verbal cueing    Lower Body Dressing/Undressing Lower body dressing      What is the patient wearing?: Pants, Incontinence brief     Lower body assist Assist for lower body dressing: Maximal Assistance - Patient 25 - 49%     Toileting Toileting    Toileting assist Assist for toileting: 2 Helpers     Transfers Chair/bed transfer  Transfers assist     Chair/bed  transfer assist level: Minimal Assistance - Patient > 75% Chair/bed transfer assistive device: Programmer, multimedia   Ambulation assist   Ambulation activity did not occur: Safety/medical concerns  Assist level: Minimal Assistance - Patient > 75% Assistive device: Walker-rolling Max distance: 5   Walk 10 feet activity   Assist  Walk 10 feet activity did not occur: Safety/medical concerns  Assist level: Minimal Assistance - Patient > 75% Assistive device: No Device   Walk 50 feet activity   Assist Walk 50 feet with 2 turns activity did not occur: Safety/medical concerns  Assist level: Minimal Assistance - Patient > 75% Assistive device: Walker-rolling    Walk 150 feet activity   Assist Walk 150 feet activity did not occur: Safety/medical concerns  Assist level: Minimal Assistance - Patient > 75% Assistive device: Walker-rolling    Walk 10 feet on uneven surface  activity   Assist Walk 10 feet on uneven surfaces activity did not occur: Safety/medical concerns         Wheelchair     Assist Is the patient using a wheelchair?: Yes Type of Wheelchair: Manual Wheelchair activity did not occur: Safety/medical concerns (limited by cognitive deficits)  Wheelchair assist level: Supervision/Verbal cueing Max wheelchair distance: 57 ft    Wheelchair 50 feet with 2 turns activity    Assist    Wheelchair 50 feet with 2 turns activity did not occur: Safety/medical concerns   Assist Level: Supervision/Verbal cueing   Wheelchair 150 feet activity     Assist  Wheelchair 150 feet activity did not occur: Safety/medical concerns       Blood pressure 110/65, pulse 61, temperature 97.9 F (36.6 C), temperature source Oral, resp. rate 20, height 5\' 10"  (1.778 m), weight 85.3 kg, SpO2 100 %.    Medical Problem List and Plan: 1. 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  Status post posterior decompression with craniectomy 11/26/2021             -patient may not yet shower             -ELOS/Goals: given ongoing mobility/safety needs we have extended his LOS to 2/15.  supervision to min assist goals   -Continue CIR therapies including PT, OT, and SLP    2.  Antithrombotics: -DVT/anticoagulation:  Pharmaceutical: Other (comment) Eliquis             -antiplatelet therapy: N/A 3. Pain Management: Tizanidine no longer ordered, Voltaren gel 4 times daily, Fioricet as needed, oxycodone as needed 4. Mood/sleep:                 -antipsychotic agents: seroquel               - sleep chart    - continue seroquel 50 mg qam and 150mg  qhs  -continue HS celexa for sleep and mood stabilization  -rx'ed UTI  2/3 -agitation improving. ABS only 15-17 yesterday 5. Neuropsych: This patient is not capable of making decisions on his own behalf.  -still requires soft wrist restraints for safety/equipiment-  6. Skin/Wound Care: Routine skin checks 7. Fluids/Electrolytes/Nutrition:   Continue foley for PEG as long as it continues to function. If he needs it long term will have balloon bumper feeding tube inserted  - on bolus feeds   - bolus schedule was adjusted, RD added further volume to TF 8.  Prolonged ventilatory support.  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     Decannulated 1/16---occlusive dressing to trach stoma-  closed 9.  Dysphagia.  Status post gastrostomy tube 12/13/2021 per Dr.Lovick-  -continue NPO, trials with SLP. Repeat MBS 2/3 pending 10.  Aspiration pneumonia.  Antibiotic therapy completed.  -afebrile  -oob, oxygenation improved 11.  New onset atrial fibrillation.  Started Eliquis 12/17/2021.  Continue low-dose beta-blocker.  2/3 HR controlled  -appreciate cardiology consult. Discussed case with Dr. Gardiner Rhyme today. Resuming eliquis 2.5mg  bid  -asa/plavix if he starts to bleed again  -potential Watchman LA appendage closure device  placement? Vitals:   01/19/22 0441 01/19/22 1406  BP: (!) 127/56 110/65  Pulse: 66 61  Resp: 16 20  Temp: 97.8 F (36.6 C) 97.9 F (36.6 C)  SpO2: 100% 100%    12.  Hypertension.  Continue Norvasc 10 mg daily, Cozaar 50 mg daily, , clonidine 0.1 mg every 8 hours.  ,  2/3-2/4 BP controlled. Clonidine d/ced 2/3 13.  Hyperlipidemia.  Lipitor 14.  History of BPH/urinary retention.        -generally has been voiding but is incontinent 15.  CAD with CABG times 03/2011 in [place].  Follow-up per cardiology services. 16. 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 Has extensive hx per wife/son:    2/3 eliquis resumed at low dose -  100k E coli UTI was rx'ed with keflex  -hgb stable to impropved at 10.5 2/2  -urology has seen, ?finasteride if can take po  -urine has been clear for 48+ hours    -continue to track hgb--recheck monday    LOS: 25 days A FACE TO FACE EVALUATION WAS [place] 01/19/2022, 4:31 PM

## 2022-01-20 MED ORDER — AMLODIPINE BESYLATE 5 MG PO TABS
5.0000 mg | ORAL_TABLET | Freq: Every day | ORAL | Status: DC
  Administered 2022-01-21 – 2022-01-26 (×6): 5 mg
  Filled 2022-01-20 (×6): qty 1

## 2022-01-20 NOTE — Progress Notes (Signed)
Occupational Therapy Session Note  Patient Details  Name: [name] MRN: [id number] Date of Birth: 02-Jun-1946  Today's Date: 01/20/2022 OT Individual Time: 1104-1200 OT Individual Time Calculation (min): 56 min    Short Term Goals: Week 4:  OT Short Term Goal 1 (Week 4): STG = LTG d/t ELOS  Skilled Therapeutic Interventions/Progress Updates:    Pt received from PT in gym. Family education session completed with pt and wife [name]. Verbal education provided re fall risk reduction, energy conservation strategies, home carryover of transfer training, ADLs, and IADLs. Demonstration and hands on training completed for pt performance of UB/LB bathing and dressing at min A level, toileting hygiene and transfers, and shower transfers. Pt completed a full shower this session with terry assisting- provided cueing/education on improving safety and energy conservation, as well as BP support. Pt had one instance following shower where teds were removed and he became pale and slightly diaphoretic. He returned to supine and once BP was able to safely be assessed it was 132/66. He was left supine with all needs met, bed alarm set.    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    Therapy/Group: Individual Therapy  [name] 01/20/2022, 6:58 AM

## 2022-01-20 NOTE — Progress Notes (Signed)
PROGRESS NOTE   Sleepy No new issues overnight. Tolerated therapy well yesterday Denies pain  ROS: Patient denies fever, rash, sore throat, blurred vision, dizziness, nausea, vomiting, diarrhea, cough, shortness of breath or chest pain, joint or back/neck pain,  or mood change, pain   Objective:   No results found. No results for input(s): WBC, HGB, HCT, PLT in the last 72 hours.   No results for input(s): NA, K, CL, CO2, GLUCOSE, BUN, CREATININE, CALCIUM in the last 72 hours.     Intake/Output Summary (Last 24 hours) at 01/20/2022 1057 Last data filed at 01/20/2022 0149 Gross per 24 hour  Intake 830 ml  Output 650 ml  Net 180 ml             Physical Exam: Vital Signs Blood pressure (!) 109/59, pulse 62, temperature 98.2 F (36.8 C), resp. rate 16, height 5\' 10"  (1.778 m), weight 85.3 kg, SpO2 94 %. Gen: no distress, normal appearing HEENT: oral mucosa pink and moist, NCAT Cardio: Reg rate Chest: normal effort, normal rate of breathing Abd: soft, non-distended Ext: no edema Psych: pleasant, normal affect Skin: intact  Neurologic: Cranial nerves II through XII intact, motor strength is 5/5 in bilateral deltoid, bicep, tricep, grip, hip flexor, knee extensors, ankle dorsiflexor and plantar flexor GU; urine yellow, clear Skin: No evidence of breakdown, no evidence of rash. PEG site CDI  Neuro:  .alert, oriented to person [facility], follows basic commands. No resting tone. Sensory exam grossly intact. Cooperative  Musculoskeletal: no jt pain   Assessment/Plan: 1. Functional deficits which require 3+ hours per day of interdisciplinary therapy in a comprehensive inpatient rehab setting. Physiatrist is providing close team supervision and 24 hour management of active medical problems listed below. Physiatrist and rehab team continue to assess barriers to discharge/monitor patient progress toward functional  and medical goals  Care Tool:  Bathing    Body parts bathed by patient: Right arm, Left arm, Chest, Abdomen, Face   Body parts bathed by helper: Right arm, Left arm, Chest, Abdomen     Bathing assist Assist Level: Contact Guard/Touching assist     Upper Body Dressing/Undressing Upper body dressing   What is the patient wearing?: Pull over shirt    Upper body assist Assist Level: Supervision/Verbal cueing    Lower Body Dressing/Undressing Lower body dressing      What is the patient wearing?: Pants, Incontinence brief     Lower body assist Assist for lower body dressing: Maximal Assistance - Patient 25 - 49%     Toileting Toileting    Toileting assist Assist for toileting: 2 Helpers     Transfers Chair/bed transfer  Transfers assist     Chair/bed transfer assist level: Minimal Assistance - Patient > 75% Chair/bed transfer assistive device: Programmer, multimedia   Ambulation assist   Ambulation activity did not occur: Safety/medical concerns  Assist level: Minimal Assistance - Patient > 75% Assistive device: Walker-rolling Max distance: 5   Walk 10 feet activity   Assist  Walk 10 feet activity did not occur: Safety/medical concerns  Assist level: Minimal Assistance - Patient > 75% Assistive device: No Device   Walk 50  feet activity   Assist Walk 50 feet with 2 turns activity did not occur: Safety/medical concerns  Assist level: Minimal Assistance - Patient > 75% Assistive device: Walker-rolling    Walk 150 feet activity   Assist Walk 150 feet activity did not occur: Safety/medical concerns  Assist level: Minimal Assistance - Patient > 75% Assistive device: Walker-rolling    Walk 10 feet on uneven surface  activity   Assist Walk 10 feet on uneven surfaces activity did not occur: Safety/medical concerns         Wheelchair     Assist Is the patient using a wheelchair?: Yes Type of Wheelchair: Manual Wheelchair  activity did not occur: Safety/medical concerns (limited by cognitive deficits)  Wheelchair assist level: Supervision/Verbal cueing Max wheelchair distance: 57 ft    Wheelchair 50 feet with 2 turns activity    Assist    Wheelchair 50 feet with 2 turns activity did not occur: Safety/medical concerns   Assist Level: Supervision/Verbal cueing   Wheelchair 150 feet activity     Assist  Wheelchair 150 feet activity did not occur: Safety/medical concerns       Blood pressure (!) 109/59, pulse 62, temperature 98.2 F (36.8 C), resp. rate 16, height 5\' 10"  (1.778 m), weight 85.3 kg, SpO2 94 %.    Medical Problem List and Plan: 1. 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  Status post posterior decompression with craniectomy 11/26/2021             -patient may not yet shower             -ELOS/Goals: given ongoing mobility/safety needs we have extended his LOS to 2/15.  supervision to min assist goals   -Continue CIR therapies including PT, OT, and SLP    2.  Antithrombotics: -DVT/anticoagulation:  Pharmaceutical: Other (comment) Eliquis             -antiplatelet therapy: N/A 3. Pain Management: Tizanidine no longer ordered, Voltaren gel 4 times daily, Fioricet as needed, oxycodone as needed 4. Mood/sleep:                 -antipsychotic agents: seroquel               - sleep chart    - continue seroquel 50 mg qam and 150mg  qhs  -continue HS celexa for sleep and mood stabilization  -rx'ed UTI  2/3 -agitation improving. ABS only 15-17 yesterday 5. Neuropsych: This patient is not capable of making decisions on his own behalf.  -still requires soft wrist restraints for safety/equipiment-  6. Skin/Wound Care: Routine skin checks 7. Fluids/Electrolytes/Nutrition:   Continue foley for PEG as long as it continues to function. If he needs it long term will have balloon bumper feeding tube  inserted  - on bolus feeds   - bolus schedule was adjusted, RD added further volume to TF 8.  Prolonged ventilatory support.  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     Decannulated 1/16---occlusive dressing to trach stoma-  closed 9.  Dysphagia.  Status post gastrostomy tube 12/13/2021 per Dr.Lovick-  -continue NPO, trials with SLP. Repeat MBS 2/3 pending 10.  Aspiration pneumonia.  Antibiotic therapy completed.  -afebrile  -oob, oxygenation improved 11.  New onset atrial fibrillation.  Started Eliquis 12/17/2021.  Continue low-dose beta-blocker.  2/5 HR controlled  -appreciate cardiology consult. Discussed case with Dr. Gardiner Rhyme today. Resuming eliquis 2.5mg  bid  -asa/plavix if he starts to bleed  again  -potential Watchman LA appendage closure device placement? Vitals:   01/19/22 1933 01/20/22 0408  BP: 129/69 (!) 109/59  Pulse: 77 62  Resp: 16 16  Temp: 98.6 F (37 C) 98.2 F (36.8 C)  SpO2: 96% 94%    12.  Hypertension.  Continue Norvasc 10 mg daily, Cozaar 50 mg daily, , clonidine 0.1 mg every 8 hours.  ,  2/3-2/4 BP controlled. Clonidine d/ced 2/3  2/5: BO normal to soft, decrease norvasc to 5mg .  13.  Hyperlipidemia.  Lipitor 14.  History of BPH/urinary retention.        -generally has been voiding but is incontinent 15.  CAD with CABG times 03/2011 in [place].  Follow-up per cardiology services. 16. 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 Has extensive hx per wife/son:    2/3 eliquis resumed at low dose -  100k E coli UTI was rx'ed with keflex  -hgb stable to impropved at 10.5 2/2  -urology has seen, ?finasteride if can take po  -urine has been clear for 48+ hours    -continue to track hgb--recheck monday    LOS: 26 days A FACE TO [place] 01/20/2022, 10:57 AM

## 2022-01-20 NOTE — Progress Notes (Signed)
Physical Therapy Weekly Progress Note  Patient Details  Name: [name] MRN: [id number] Date of Birth: 01-Aug-1946  Beginning of progress report period: January 09, 2022 End of progress report period: January 20, 2022  Today's Date: 01/20/2022 PT Individual Time: 1010-1104 PT Individual Time Calculation (min): 54 min   Patient has met 2 of 3 short term goals.  Patient with variable progress due to altered mental status with increased confusion and agitation with +UTI. Behaviors improving with treatment and progressing slowly with balance deficits. Focused on management of impulsivity and decreased frustration tolerance with mobility with hands-on family training this week at min A level using RW for patient safety at home.   Patient continues to demonstrate the following deficits decreased cardiorespiratoy endurance, motor apraxia, ataxia, decreased coordination, and decreased motor planning, decreased initiation, decreased attention, decreased awareness, decreased problem solving, decreased safety awareness, decreased memory, and delayed processing, and decreased sitting balance, decreased standing balance, decreased postural control, decreased balance strategies, and difficulty maintaining precautions and therefore will continue to benefit from skilled PT intervention to increase functional independence with mobility.  Patient progressing toward long term goals..  Plan of care revisions: patient's LOS extended through .  PT Short Term Goals Week 3:  PT Short Term Goal 1 (Week 3): Patient will perform basic transfers with min A consistently. PT Short Term Goal 1 - Progress (Week 3): Met PT Short Term Goal 2 (Week 3): Patient will ambulate mod-min A without overt LOB using LRAD >200 ft. PT Short Term Goal 2 - Progress (Week 3): Met PT Short Term Goal 3 (Week 3): Patient will perform 4 steps using 1 rail with min A of 1 person. PT Short Term Goal 3 - Progress (Week 3): Progressing toward  goal Week 4:  PT Short Term Goal 1 (Week 4): STG=LTG due to ELOS  Skilled Therapeutic Interventions/Progress Updates:     Patient in bed with his wife and RN at bed upon PT arrival. Patient alert and agreeable to PT session. Patient denied pain during session.  Patient with spilled tube-feed at beginning of session. Removed and reapplied dressing, will ask for additional abdominal binder for cleaning. RN made aware.   Patient and his wife report improved behaviors and agitation since last week and are eager to focus on family training for d/c home. Focused session on hands-on training with patient's wife for toilet transfers in the room with timed toileting (every 2 hours) to reduce patient's frustration around urgency with toileting. Cleared patient to ambulate to/from the bathroom with his wife, [name], with plan to call for assist from nursing staff for toileting tasks due to balance deficits and patient's apraxia. Patient and his wife in agreement, safety plan updated, and RN made aware.   Therapeutic Activity: Bed Mobility: Patient performed supine to sit with supervision in a flat bed without use of bed rails. Provided verbal cues for initiation and coming forward to reduce posterior bias when coming to sitting. Transfers: Patient performed sit to/from stand from the bed and [facility] over the toilet with CGA using RW with his wife assisting providing cues for hand placement and forward weight shift. Patient was continent of bladder during toileting, performed lower body clothing management with total A and cues for holding onto the RW for balance to allow his wife to assist him with donning a new brief and pulling up his pants. Noted patient's urine to be clear/yellow in color this session.   He performed blocked practices stand, ambulate 5 feet, turn,  back-up, reach-back, then sit x3 from [facility] to/from a standard arm chair to practice sequencing and control with functional transfers. Patient's  wife performed CGA x1 following PT's demonstration of cuing for sequencing with patient.   Gait Training:  Patient ambulated >100 feet x2 using RW with min A. Ambulated with decreased R step height and length, increased R lean, poor safety awareness with use of RW (lifting off the floor), impulsivity on turns with R LOB x2, and increased forward trunk lean and downward head gaze. Provided verbal cues for pushing down into the RW to maintain contact with the floor for safety, slow/controlled stepping to reduce LOB, and erect posture and looking ahead for improved spatial awareness and foot clearance. .  Patient in the day room seated in a standard arm chair with his wife when handed off to [place], [place], for continued family training at end of session.  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

## 2022-01-21 LAB — CBC
HCT: 35.9 % — ABNORMAL LOW (ref 39.0–52.0)
Hemoglobin: 11.7 g/dL — ABNORMAL LOW (ref 13.0–17.0)
MCH: 31 pg (ref 26.0–34.0)
MCHC: 32.6 g/dL (ref 30.0–36.0)
MCV: 95.2 fL (ref 80.0–100.0)
Platelets: 290 10*3/uL (ref 150–400)
RBC: 3.77 MIL/uL — ABNORMAL LOW (ref 4.22–5.81)
RDW: 16.5 % — ABNORMAL HIGH (ref 11.5–15.5)
WBC: 6.8 10*3/uL (ref 4.0–10.5)
nRBC: 0 % (ref 0.0–0.2)

## 2022-01-21 LAB — BASIC METABOLIC PANEL
Anion gap: 10 (ref 5–15)
BUN: 19 mg/dL (ref 8–23)
CO2: 26 mmol/L (ref 22–32)
Calcium: 8.7 mg/dL — ABNORMAL LOW (ref 8.9–10.3)
Chloride: 104 mmol/L (ref 98–111)
Creatinine, Ser: 0.9 mg/dL (ref 0.61–1.24)
GFR, Estimated: >60 mL/min (ref >60)
Glucose, Bld: 142 mg/dL — ABNORMAL HIGH (ref 70–99)
Potassium: 3.8 mmol/L (ref 3.5–5.1)
Sodium: 140 mmol/L (ref 135–145)

## 2022-01-21 NOTE — Progress Notes (Signed)
Occupational Therapy Session Note  Patient Details  Name: [name] MRN: [id number] Date of Birth: [birth year]  Today's Date: 01/21/2022 OT Individual Time: 0935-1030 OT Individual Time Calculation (min): 55 min    Short Term Goals: Week 4:  OT Short Term Goal 1 (Week 4): STG = LTG d/t ELOS  Skilled Therapeutic Interventions/Progress Updates:    Pt received supine, just returned to bed with NT assist. He completed bed mobility to EOB with (S). He was oriented loosely to time, not place, more language of confusion this AM. Stand pivot transfer with min A to the w/c. Pt pale but not reporting dizziness, BP assessed and it was 117/64. Pt taken to BITS where he worked on Agricultural consultant, BUE coordination with functional reaching and dynamic standing balance. He was often very perseverative with reaching tasks and unable to self correct or identify errors. He then completed several functional stepping activities to simulate home walk in shower transfer. Min-mod A overall required, more assist required when he would get confused/more ataxic. Pt required several seated rest breaks. He completed 200 ft of functional mobility back to the room with min A. He completed toileting tasks, voiding urine with min A overall. He returned to supine in bed and was passed off to SLP.   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  Therapy/Group: Individual Therapy  [name] 01/21/2022, 6:11 AM

## 2022-01-21 NOTE — Progress Notes (Signed)
Rn to patient bedside to give scheduled bolus however patient was confused and managed to free one of his restraints as they had not been applied securely. I was able to adequately restrain pt's wrists and then attempted to complete the bolus however patient began yelling out for help several times and the charge, [facility], came to bedside and stayed at bedside as patient refused to allow RN to push the bolus bc he did not trust me that I was giving him something that would not kill him. Attempted to reorient the patient however he would not or could not answer the questions correctly at that time. Bolus fully administered, will monitor pt behavior until change of shift.

## 2022-01-21 NOTE — Progress Notes (Signed)
Patient slept most of the night. Incontinent x1 and continent x1. Patient woke up mildly disoriented but redirectable. This morning telesitter called regarding patient trying to take off wrist restraints. Patient confused- stating "its my birthday". I reoriented the patient and provided reassurance. Patient redirected and resting at the present time.

## 2022-01-21 NOTE — Progress Notes (Signed)
Speech Language Pathology Daily Session Note  Patient Details  Name: [name] MRN: [id number] Date of Birth: 1946-08-23  Today's Date: 01/21/2022 SLP Individual Time: 1300-1330 SLP Individual Time Calculation (min): 30 min  Short Term Goals: Week 4: SLP Short Term Goal 1 (Week 4): STGs=LTGs due to ELOS  Skilled Therapeutic Interventions: Skilled ST treatment focused on cognitive and swallowing goals. Patient performed oral care via suction toothbrush after set-up A. SLP provided education to pt and sister-in-law on Provale cup which limits bolus volume to 5cc to maximize safety with consumption. Pt agreeable to trials of thin liquids (water) using Provale cup. Patient brought cup-to-mouth using RUE with sup A. Pt implemented effortful swallow with min A verbal cues to "swallow hard." Increased physical effort visible from patient with this cue. Pt exhibited overt coughing during 50% of occasions which were combination of immediate and delayed. SLP prompted pt to take extended breaks between trials to aid pharyngeal clearance where patient also demonstrated secondary swallows. Recommend continuation of thin liquid (water) trials by tsp and 5cc Provale cup with ST. Language of confusion noted resulting in min A verbal redirection throughout session for attention to task. Patient was left in bed with alarm activated and immediate needs within reach at end of session. Continue per current plan of care.      Pain Pain Assessment Pain Scale: 0-10 Pain Score: 0-No pain  Therapy/Group: Individual Therapy  Patty Sermons 01/21/2022, 3:35 PM

## 2022-01-21 NOTE — Progress Notes (Signed)
Physical Therapy Session Note  Patient Details  Name: [name] MRN: [id number] Date of Birth: 1946/02/22  Today's Date: 01/21/2022 PT Individual Time: 0800-0900 PT Individual Time Calculation (min): 60 min   Short Term Goals: Week 4:  PT Short Term Goal 1 (Week 4): STG=LTG due to ELOS  Skilled Therapeutic Interventions/Progress Updates:     Patient in bed with B soft wrist restraints secured upon PT arrival. Patient alert and agreeable to PT session. Patient denied pain during session.  Patient disoriented to date due to calendar needing to be updated, improved when corrected by PT, also reported disorientation to day/night, able to read the clock correctly stating 0800 then use the window to determine that it was morning based on sunlight outside with min cues.   Therapeutic Activity: Bed Mobility: Patient performed supine to sit with supervision with use of bed rail and cues for bringing his R arm over to push up to sitting after several trials to muscle-up into long sitting.  Transfers: Patient performed sit to/from stand from a hospital bed x2, toilet x3, mat table x3, and standard arm chair x3 with CGA using RW. Provided verbal cues for hand placement, forward weight shift, and backing-up and reaching back to sit for safety.  Gait Training:  Patient ambulated >150 feet x2 using RW with CGA and intermittent min A. Ambulated with decreased R step height and length, increased R lean, poor safety awareness with use of RW (lifting off the floor), impulsivity on turns with R LOB x2, and increased forward trunk lean and downward head gaze. Provided verbal cues for pushing down into the RW to maintain contact with the floor for safety, slow/controlled stepping to reduce LOB, and erect posture and looking ahead for improved spatial awareness and foot clearance. Patient ascended/descended 4x6" steps x2 with seated rest break in between using B upper extremities on L rail to simulate home set-up  with CGA-min A. Performed step-to gait pattern leading with R while ascending and L while descending with intermittent undershooting with R foot onto step. Provided mod cues for technique and sequencing.   Neuromuscular Re-ed: Patient performed the following balance and lower and upper extremity motor control activities: -sit to stand x8 without AD progressing from min A to close supervision focused on forward weight shift and controlled movements to reduced overshooting into extension when standing leading to posterior LOB -seated placement of cards onto velcro board on back of mirror for R hand coordination and accuracy with notable undershooting for reach, and decreased attention to R hand requiring mod cues, 100% accuracy for matching task and dropped 1/9 cards, unable to pick up from the floor with his R hand, compensated using his L -standing  Patient in w/c with RN in the room at end of session with breaks locked, seat belt alarm set, and all needs within reach.   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   Therapy/Group: Individual Therapy  Nao Linz L Jeffrey Voth PT, DPT  01/21/2022, 12:47 PM

## 2022-01-21 NOTE — Plan of Care (Signed)
°  Problem: RH Swallowing Goal: LTG Patient will consume least restrictive diet using compensatory strategies with assistance (SLP) Description: LTG:  Patient will consume least restrictive diet using compensatory strategies with assistance (SLP) Outcome: Not Applicable Note: Goal discharged, not appropriate at this time  Goal: LTG Pt will demonstrate functional change in swallow as evidenced by bedside/clinical objective assessment (SLP) Description: LTG: Patient will demonstrate functional change in swallow as evidenced by bedside/clinical objective assessment (SLP) Outcome: Not Applicable Note: Goal discharged, no longer appropriate at this time    Problem: RH Cognition - SLP Goal: RH LTG Patient will demonstrate orientation with cues Description:  LTG:  Patient will demonstrate orientation to person/place/time/situation with cues (SLP)   Flowsheets (Taken 01/21/2022 0704) LTG: Patient will demonstrate orientation using cueing (SLP): Minimal Assistance - Patient > 75% Note: Goal downgraded due to inconsistent progress   Problem: RH Problem Solving Goal: LTG Patient will demonstrate problem solving for (SLP) Description: LTG:  Patient will demonstrate problem solving for basic/complex daily situations with cues  (SLP) Flowsheets (Taken 01/21/2022 0704) LTG Patient will demonstrate problem solving for: Moderate Assistance - Patient 50 - 74% Note: Goal downgraded due to inconsistent progress   Problem: RH Memory Goal: LTG Patient will use memory compensatory aids to (SLP) Description: LTG:  Patient will use memory compensatory aids to recall biographical/new, daily complex information with cues (SLP) Flowsheets (Taken 01/21/2022 0704) LTG: Patient will use memory compensatory aids to (SLP): Moderate Assistance - Patient 50 - 74% Note: Goal downgraded due to inconsistent progress   Problem: RH Awareness Goal: LTG: Patient will demonstrate awareness during functional activites type of  (SLP) Description: LTG: Patient will demonstrate awareness during functional activites type of (SLP) Flowsheets (Taken 01/21/2022 0704) LTG: Patient will demonstrate awareness during cognitive/linguistic activities with assistance of (SLP): Moderate Assistance - Patient 50 - 74% Note: Goal downgraded due to inconsistent progress

## 2022-01-21 NOTE — Progress Notes (Signed)
Rn at bedside to assess wrist restraints found right restraint needing to be reapplied. In my efforts to fix the restraints where patient had been tugging and pulling at them, patient pulled arm out of restraint as I was adjusting the straps. When I tried to reapply the restraint patient became extremely aggressive, tense, and visibly agitated. Attempts were made to reorient the patient which was unsuccessful so Jessamine was asked to join me at the bedside along w/ another nurse to help reapply. Pt continued to be very aggressive trying to strike out at [place] and was also kicking his legs. With assistance, I was able to get the restraint reapplied and tied w/o any injury or harm occurring to patient or staff.  Pt seemed to settle back down afterwards.

## 2022-01-21 NOTE — Progress Notes (Signed)
PROGRESS NOTE   Up early with PT on toilet. Urine clear. No pain. Had a reasonable night.   ROS: Limited due to cognitive/behavioral    Objective:   No results found. Recent Labs    01/21/22 0654  WBC 6.8  HGB 11.7*  HCT 35.9*  PLT 290     Recent Labs    01/21/22 0654  NA 140  K 3.8  CL 104  CO2 26  GLUCOSE 142*  BUN 19  CREATININE 0.90  CALCIUM 8.7*       Intake/Output Summary (Last 24 hours) at 01/21/2022 1119 Last data filed at 01/21/2022 0758 Gross per 24 hour  Intake 0 ml  Output 300 ml  Net -300 ml             Physical Exam: Vital Signs Blood pressure (!) 109/57, pulse 61, temperature (!) 97.5 F (36.4 C), temperature source Oral, resp. rate 18, height 5\' 10"  (1.778 m), weight 87.7 kg, SpO2 95 %. Constitutional: No distress . Vital signs reviewed. HEENT: NCAT, EOMI, oral membranes moist Neck: supple Cardiovascular: RRR without murmur. No JVD    Respiratory/Chest: CTA Bilaterally without wheezes or rales. Normal effort    GI/Abdomen: BS +, non-tender, non-distended. PEG site clean Ext: no clubbing, cyanosis, or edema Psych: calmt and cooperative  Skin: intact Neurologic: Cranial nerves II through XII intact, motor strength is 5/5 in bilateral deltoid, bicep, tricep, grip, hip flexor, knee extensors, ankle dorsiflexor and plantar flexor GU; urine yellow, clear Skin: No evidence of breakdown, no evidence of rash.   Neuro:  .alert, oriented to person, hospital, remembered some of prior convo with PT with some cues.  follows basic commands. No resting tone. Sensory exam grossly intact. Cooperative  Musculoskeletal: no jt pain   Assessment/Plan: 1. Functional deficits which require 3+ hours per day of interdisciplinary therapy in a comprehensive inpatient rehab setting. Physiatrist is providing close team supervision and 24 hour management of active medical problems listed  below. Physiatrist and rehab team continue to assess barriers to discharge/monitor patient progress toward functional and medical goals  Care Tool:  Bathing    Body parts bathed by patient: Right arm, Left arm, Right lower leg, Left lower leg, Face, Chest, Abdomen, Front perineal area, Buttocks, Right upper leg, Left upper leg   Body parts bathed by helper: Right arm, Left arm, Chest, Abdomen     Bathing assist Assist Level: Minimal Assistance - Patient > 75%     Upper Body Dressing/Undressing Upper body dressing   What is the patient wearing?: Pull over shirt    Upper body assist Assist Level: Supervision/Verbal cueing    Lower Body Dressing/Undressing Lower body dressing      What is the patient wearing?: Pants, Incontinence brief     Lower body assist Assist for lower body dressing: Moderate Assistance - Patient 50 - 74%     Toileting Toileting    Toileting assist Assist for toileting: Moderate Assistance - Patient 50 - 74%     Transfers Chair/bed transfer  Transfers assist     Chair/bed transfer assist level: Minimal Assistance - Patient > 75% Chair/bed transfer assistive device: Programmer, multimedia  Ambulation assist   Ambulation activity did not occur: Safety/medical concerns  Assist level: Minimal Assistance - Patient > 75% Assistive device: Walker-rolling Max distance: 5   Walk 10 feet activity   Assist  Walk 10 feet activity did not occur: Safety/medical concerns  Assist level: Minimal Assistance - Patient > 75% Assistive device: No Device   Walk 50 feet activity   Assist Walk 50 feet with 2 turns activity did not occur: Safety/medical concerns  Assist level: Minimal Assistance - Patient > 75% Assistive device: Walker-rolling    Walk 150 feet activity   Assist Walk 150 feet activity did not occur: Safety/medical concerns  Assist level: Minimal Assistance - Patient > 75% Assistive device: Walker-rolling    Walk  10 feet on uneven surface  activity   Assist Walk 10 feet on uneven surfaces activity did not occur: Safety/medical concerns         Wheelchair     Assist Is the patient using a wheelchair?: Yes Type of Wheelchair: Manual Wheelchair activity did not occur: Safety/medical concerns (limited by cognitive deficits)  Wheelchair assist level: Supervision/Verbal cueing Max wheelchair distance: 57 ft    Wheelchair 50 feet with 2 turns activity    Assist    Wheelchair 50 feet with 2 turns activity did not occur: Safety/medical concerns   Assist Level: Supervision/Verbal cueing   Wheelchair 150 feet activity     Assist  Wheelchair 150 feet activity did not occur: Safety/medical concerns       Blood pressure (!) 109/57, pulse 61, temperature (!) 97.5 F (36.4 C), temperature source Oral, resp. rate 18, height 5\' 10"  (1.778 m), weight 87.7 kg, SpO2 95 %.    Medical Problem List and Plan: 1. 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  Status post posterior decompression with craniectomy 11/26/2021             -patient may not yet shower             -ELOS/Goals:   2/15.  supervision to min assist goals   -Continue CIR therapies including PT, OT, and SLP     2.  Antithrombotics: -DVT/anticoagulation:  Pharmaceutical: Other (comment) Eliquis             -antiplatelet therapy: N/A 3. Pain Management: Tizanidine no longer ordered, Voltaren gel 4 times daily, Fioricet as needed, oxycodone as needed 4. Mood/sleep:                 -antipsychotic agents: seroquel               - sleep chart    - continue seroquel 50 mg qam and 150mg  qhs  -continue HS celexa for sleep and mood stabilization  -rx'ed UTI  2/6 -agitation improving. Can dc ABS 5. Neuropsych: This patient is not capable of making decisions on his own behalf.  -still requires soft wrist restraints for safety/equipiment-  6.  Skin/Wound Care: Routine skin checks 7. Fluids/Electrolytes/Nutrition:   Continue foley for PEG as long as it continues to function. If he needs it long term will have balloon bumper feeding tube inserted  - on bolus feeds   - bolus schedule was adjusted, RD added further volume to TF 8.  Prolonged ventilatory support.  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     Decannulated 1/16---occlusive dressing to trach stoma-  closed 9.  Dysphagia.  Status post gastrostomy tube 12/13/2021 per Dr.Lovick-  -continue NPO, trials with  SLP. Repeat MBS 2/3 pending 10.  Aspiration pneumonia.  Antibiotic therapy completed.  -afebrile  -oob, oxygenation improved 11.  New onset atrial fibrillation.  Started Eliquis 12/17/2021.  Continue low-dose beta-blocker.  2/6 HR controlled  -appreciate cardiology consult. Discussed case with Dr. Gardiner Rhyme today. Resuming eliquis 2.5mg  bid  -asa/plavix if he starts to bleed again  -potential Watchman [place] appendage closure device placement? Vitals:   01/20/22 1938 01/21/22 0217  BP: (!) 113/56 (!) 109/57  Pulse: (!) 58 61  Resp: 18 18  Temp: 98.3 F (36.8 C) (!) 97.5 F (36.4 C)  SpO2: 100% 95%    12.  Hypertension.  Continue Norvasc 10 mg daily, Cozaar 50 mg daily, , clonidine 0.1 mg every 8 hours.  ,  2/3-2/4 BP controlled. Clonidine d/ced 2/3  2/5: BP normal to soft, decrease norvasc to 5mg .  13.  Hyperlipidemia.  Lipitor 14.  History of BPH/urinary retention.        -generally has been voiding but is incontinent 15.  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  Follow-up per cardiology services. 16. 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 Has extensive hx per wife/son:     eliquis resumed at low dose 2/3 -  100k E coli UTI was rx'ed with keflex  -hgb improved to 11.7 2/6  -urology has seen, ?finasteride if can take po  -urine remains clear, no discomfort or residuals    LOS: 27 days A FACE TO [place] 01/21/2022, 11:19 AM

## 2022-01-21 NOTE — Progress Notes (Addendum)
Rn called to bedside by telesitter bc patient pulled right wrist from his restraint. Pt is not allowing staff to place the restraint back in place w/o becoming physically aggressive and angry; pt also wants to speak to dr about the restraint order. Pt continues to demonstrate altered mental state and confusion.   1730: Security called to assist w/ placing patient back in restraints safely.   Red streak noted on right arm d/t patient jerking and pulling on restraints.

## 2022-01-21 NOTE — Progress Notes (Signed)
Speech Language Pathology Daily Session Note  Patient Details  Name: [name] MRN: [id number] Date of Birth: Apr 23, 1946  Today's Date: 01/21/2022 SLP Individual Time: 8144-8185 SLP Individual Time Calculation (min): 55 min  Short Term Goals: Week 4: SLP Short Term Goal 1 (Week 4): STGs=LTGs due to ELOS  Skilled Therapeutic Interventions: Skilled treatment session focused on cognitive and dysphagia goals. SLP facilitated session by providing education while utilizing feedback from the [facility] video regarding his current swallowing function, goals of care and importance of utilizing swallowing compensatory strategies. Patient performed oral care via the suction toothbrush after set-up assist. Patient consumed tsp sips of thin liquids with overt cough in 25% of trials due to attempting to talk with a full oral cavity. When attending to task, patient did great with cues to "swallow hard" and utilizing multiple swallows with Min verbal cues. Recommend trial of 5cc Provale cup in order to maximize safety with trials with plan to allow family and other staff members to administer trials. Throughout session, patient was oriented to place and time with intermittent language of confusion and confabulation noted. Patient did demonstrate improved awareness of deficits as well as recall from events with previous therapy sessions. Patient left upright in bed with alarm on and all needs within reach. Continue with current plan of care.      Pain No/Denies Pain   Therapy/Group: Individual Therapy  Yamna Mackel 01/21/2022, 12:59 PM

## 2022-01-22 NOTE — Progress Notes (Signed)
Patient slept majority of the night. Pleasantly confused. No agitated behaviors noted this shift.

## 2022-01-22 NOTE — Progress Notes (Signed)
PROGRESS NOTE   Became agitated yesterday late in day. Slept most of night. Calm this morning.   ROS: Limited due to cognitive/behavioral     Objective:   No results found. Recent Labs    01/21/22 0654  WBC 6.8  HGB 11.7*  HCT 35.9*  PLT 290     Recent Labs    01/21/22 0654  NA 140  K 3.8  CL 104  CO2 26  GLUCOSE 142*  BUN 19  CREATININE 0.90  CALCIUM 8.7*       Intake/Output Summary (Last 24 hours) at 01/22/2022 1112 Last data filed at 01/22/2022 0700 Gross per 24 hour  Intake 0 ml  Output 300 ml  Net -300 ml             Physical Exam: Vital Signs Blood pressure (!) 120/57, pulse 61, temperature 98.1 F (36.7 C), temperature source Oral, resp. rate 18, height 5\' 10"  (1.778 m), weight 87 kg, SpO2 98 %. Constitutional: No distress . Vital signs reviewed. HEENT: NCAT, EOMI, oral membranes moist Neck: supple Cardiovascular: RRR without murmur. No JVD    Respiratory/Chest: CTA Bilaterally without wheezes or rales. Normal effort    GI/Abdomen: BS +, non-tender, non-distended Ext: no clubbing, cyanosis, or edema Psych: calm, cooperative Skin: intact Neurologic: Cranial nerves II through XII intact, motor strength is 5/5 in bilateral deltoid, bicep, tricep, grip, hip flexor, knee extensors, ankle dorsiflexor and plantar flexor GU; urine yellow, clear Skin: No evidence of breakdown, no evidence of rash.   Neuro:  .alert, oriented to person, hospital,  limited insight. We had discussion about going home, what he needs to work on. Seemed receptive.  follows basic commands. No resting tone. Sensory exam grossly intact. Cooperative  Musculoskeletal: no jt pain   Assessment/Plan: 1. Functional deficits which require 3+ hours per day of interdisciplinary therapy in a comprehensive inpatient rehab setting. Physiatrist is providing close team supervision and 24 hour management of active medical problems  listed below. Physiatrist and rehab team continue to assess barriers to discharge/monitor patient progress toward functional and medical goals  Care Tool:  Bathing    Body parts bathed by patient: Right arm, Left arm, Right lower leg, Left lower leg, Face, Chest, Abdomen, Front perineal area, Buttocks, Right upper leg, Left upper leg   Body parts bathed by helper: Right arm, Left arm, Chest, Abdomen     Bathing assist Assist Level: Minimal Assistance - Patient > 75%     Upper Body Dressing/Undressing Upper body dressing   What is the patient wearing?: Pull over shirt    Upper body assist Assist Level: Supervision/Verbal cueing    Lower Body Dressing/Undressing Lower body dressing      What is the patient wearing?: Pants, Incontinence brief     Lower body assist Assist for lower body dressing: Moderate Assistance - Patient 50 - 74%     Toileting Toileting    Toileting assist Assist for toileting: Moderate Assistance - Patient 50 - 74%     Transfers Chair/bed transfer  Transfers assist     Chair/bed transfer assist level: Contact Guard/Touching assist Chair/bed transfer assistive device: Museum/gallery exhibitions officer  assist   Ambulation activity did not occur: Safety/medical concerns  Assist level: Minimal Assistance - Patient > 75% Assistive device: Walker-rolling Max distance: >150 ft   Walk 10 feet activity   Assist  Walk 10 feet activity did not occur: Safety/medical concerns  Assist level: Minimal Assistance - Patient > 75% Assistive device: Walker-rolling   Walk 50 feet activity   Assist Walk 50 feet with 2 turns activity did not occur: Safety/medical concerns  Assist level: Minimal Assistance - Patient > 75% Assistive device: Walker-rolling    Walk 150 feet activity   Assist Walk 150 feet activity did not occur: Safety/medical concerns  Assist level: Minimal Assistance - Patient > 75% Assistive device:  Walker-rolling    Walk 10 feet on uneven surface  activity   Assist Walk 10 feet on uneven surfaces activity did not occur: Safety/medical concerns         Wheelchair     Assist Is the patient using a wheelchair?: Yes Type of Wheelchair: Manual Wheelchair activity did not occur: Safety/medical concerns (limited by cognitive deficits)  Wheelchair assist level: Supervision/Verbal cueing Max wheelchair distance: 57 ft    Wheelchair 50 feet with 2 turns activity    Assist    Wheelchair 50 feet with 2 turns activity did not occur: Safety/medical concerns   Assist Level: Supervision/Verbal cueing   Wheelchair 150 feet activity     Assist  Wheelchair 150 feet activity did not occur: Safety/medical concerns       Blood pressure (!) 120/57, pulse 61, temperature 98.1 F (36.7 C), temperature source Oral, resp. rate 18, height 5\' 10"  (1.778 m), weight 87 kg, SpO2 98 %.    Medical Problem List and Plan: 1. 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  Status post posterior decompression with craniectomy 11/26/2021             -patient may not yet shower             -ELOS/Goals:   2/15.  supervision to min assist goals   -Continue CIR therapies including PT, OT, and SLP. Interdisciplinary team conference today to discuss goals, barriers to discharge, and dc planning.       2.  Antithrombotics: -DVT/anticoagulation:  Pharmaceutical: Other (comment) Eliquis             -antiplatelet therapy: N/A 3. Pain Management: Tizanidine no longer ordered, Voltaren gel 4 times daily, Fioricet as needed, oxycodone as needed 4. Mood/sleep:                 -antipsychotic agents: seroquel               - sleep chart    - continue seroquel 50 mg qam and 150mg  qhs  -continue HS celexa for sleep and mood stabilization  -rx'ed UTI 2/6 -agitation improving except for late in day. A lot of this centers  around using wrist restraints. Will dc wrist restraints and see how he does.  5. Neuropsych: This patient is not capable of making decisions on his own behalf.  -will change to waist belt restraint, telesitter ongoing-  6. Skin/Wound Care: Routine skin checks 7. Fluids/Electrolytes/Nutrition:   Continue foley for PEG as long as it continues to function. If he needs it long term will have balloon bumper feeding tube inserted  - on bolus feeds   - bolus schedule was adjusted, RD added further volume to TF 8.  Prolonged ventilatory support.  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     Decannulated 1/16---occlusive dressing to trach stoma-  closed 9.  Dysphagia.  Status post gastrostomy tube 12/13/2021 per Dr.Lovick-  -continue NPO, trials with SLP only. 10.  Aspiration pneumonia.  Antibiotic therapy completed.  -afebrile  -oob, oxygenation improved 11.  New onset atrial fibrillation.  Started Eliquis 12/17/2021.  Continue low-dose beta-blocker.  2/6 HR controlled  -appreciate cardiology consult. Discussed case with Dr. Gardiner Rhyme today. Resuming eliquis 2.5mg  bid  -asa/plavix if he starts to bleed again  -potential Watchman LA appendage closure device placement? Vitals:   01/21/22 1938 01/22/22 0339  BP: 117/66 (!) 120/57  Pulse: 64 61  Resp: 18 18  Temp: 98.7 F (37.1 C) 98.1 F (36.7 C)  SpO2: 97% 98%    12.  Hypertension.  Continue Norvasc 10 mg daily, Cozaar 50 mg daily, , clonidine 0.1 mg every 8 hours.  ,  2/3-2/4 BP controlled. Clonidine d/ced 2/3  2/7 will stop norvasc for now d/t low bp's.  13.  Hyperlipidemia.  Lipitor 14.  History of BPH/urinary retention.        -generally has been voiding but is incontinent 15.  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  Follow-up per cardiology services. 16. 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 Has extensive hx per wife/son:     eliquis resumed at low dose 2/3 -  100k E coli UTI was rx'ed with  keflex  -hgb improved to 11.7 2/6  -urology has seen, ?finasteride if can take po  -urine remains clear, no discomfort or residuals    LOS: 28 days A FACE TO FACE EVALUATION WAS PERFORMED  Meredith Staggers 01/22/2022, 11:12 AM

## 2022-01-22 NOTE — Progress Notes (Signed)
Speech Language Pathology Daily Session Note  Patient Details  Name: [name] MRN: [id number] Date of Birth: 07/19/1946  Today's Date: 01/22/2022 SLP Individual Time: 1300-1345 SLP Individual Time Calculation (min): 45 min  Short Term Goals: Week 4: SLP Short Term Goal 1 (Week 4): STGs=LTGs due to ELOS  Skilled Therapeutic Interventions: Skilled treatment session focused on cognitive goals. Upon arrival, patient was asleep in bed and appeared to have vomited. Patient easily roused and reported he felt he vomited due to having "too much water pushed through the tube." Nursing made aware. Patient attempted to remove his shirt while supine in bed and required Mod verbal cues for problem solving regarding sitting EOB to change his shirt. Patient transferred to the wheelchair with Min verbal cues needed for safety in order for nursing to change the linen. Patient quickly requested to get back into bed due to not feeling well and reports of "sharp abdominal pain," Nursing aware. Due to vomiting and reports of stomach pain, patient declined trials despite multiple offers/attempts. SLP facilitated session by providing overall Min verbal cues for problem solving and selective attention during a simple calendar making task. Patient with intermittent language of confusion but increased awareness/recall of daily information. Patient left upright in bed with alarm on and all needs within reach. Continue with current plan of care.      Pain Pain Assessment Pain Scale: 0-10 Pain Score: 0-No pain  Therapy/Group: Individual Therapy  Sally Reimers 01/22/2022, 3:42 PM

## 2022-01-22 NOTE — Progress Notes (Signed)
Occupational Therapy Weekly Progress Note  Patient Details  Name: [name] MRN: [id number] Date of Birth: [birth year]  Beginning of progress report period: 01/16/22 End of progress report period: 01/22/22  [name] continues to progress within his OT POC. He is able to more consistently transfer at a min A level. His coordination has improved so that he is able to thread pants on with only min A and he can don a shirt with (S). He also requires less assistance to sequence and motor plan his ADLs. Family education has taken place with his wife [name] and son [name] and they will both have another session at the end of this week.   Patient continues to demonstrate the following deficits: muscle weakness, decreased coordination and decreased motor planning, decreased midline orientation, decreased initiation, decreased attention, decreased awareness, decreased problem solving, decreased safety awareness, decreased memory, and delayed processing, and decreased standing balance, decreased postural control, and decreased balance strategies and therefore will continue to benefit from skilled OT intervention to enhance overall performance with BADL and iADL.  Patient progressing toward long term goals..  Continue plan of care.  OT Short Term Goals Week 4:  OT Short Term Goal 1 (Week 4): STG = LTG d/t ELOS OT Short Term Goal 1 - Progress (Week 4): Progressing toward goal Week 5:  OT Short Term Goal 1 (Week 5): Continue progressing toward established LTG    [name] 01/22/2022, 7:59 PM

## 2022-01-22 NOTE — Progress Notes (Signed)
Occupational Therapy Session Note  Patient Details  Name: [name] MRN: [id number] Date of Birth: [birth year]  Today's Date: 01/22/2022 OT Individual Time: 3825-0539 OT Individual Time Calculation (min): 58 min    Short Term Goals: Week 1:  OT Short Term Goal 1 (Week 1): Pt will attend to ADL task for 1 min with min cueing OT Short Term Goal 1 - Progress (Week 1): Met OT Short Term Goal 2 (Week 1): Pt will don shirt with min A OT Short Term Goal 2 - Progress (Week 1): Met OT Short Term Goal 3 (Week 1): Pt will complete toilet transfer with LRAD with mod A OT Short Term Goal 3 - Progress (Week 1): Progressing toward goal OT Short Term Goal 4 (Week 1): Pt will complete bimanual grooming tasks with min A OT Short Term Goal 4 - Progress (Week 1): Met  Skilled Therapeutic Interventions/Progress Updates:     Pt received in bed with no complaints of pain    Therapeutic activity Functional mobility in room and to/from dayroom with MIN A overall and up to MOD A for purterbations with VC on way back from gym for R foot clearance when stepping and keeping feet inside RW.   Therapeutic exercise Functonal mobility therex/conditioning: 1 min each x5 rest  supine<>sit in B directions  sit to stand with RW and CGA-MIN A for intermittent retropulsion and VC for anteriror weight shift/leading with head towel resistance presses  3 rounds with 2-3 min rest between rounds for improved functional moiblity &conditioning requried for BADLs/IADLs BP/HR as follows 120/64 72 prior to tx 119/70 80 after 1st round 132/69  88 after 2nd round 136/82 95 after 3rd rounds  Pt left at end of session in bed with exit alarm on, wrist restraints on, call light in reach and all needs met   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, truncal ataxia Restrictions Weight Bearing Restrictions:  No Other Position/Activity Restrictions: Wears a lift in R shoe 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

## 2022-01-22 NOTE — Progress Notes (Signed)
Occupational Therapy Session Note  Patient Details  Name: [name] MRN: [id number] Date of Birth: 07/06/1946  Today's Date: 01/22/2022 OT Individual Time: 1139-1205 OT Individual Time Calculation (min): 26 min   Short Term Goals: Week 4:  OT Short Term Goal 1 (Week 4): STG = LTG d/t ELOS  Skilled Therapeutic Interventions/Progress Updates:    Pt greeted semi-reclined in bed after handoff from previous OT session. Stand-pivot transfer bed to wc with strong retropulsion on initial stand requiring pt to return to sitting. Pt then reached toward wc to facilitate anterior weight shift and was able to stand-pivot with min A. Pt brought to therapy gym and worked on standing balance and ataxic movements with BIG movements using boxing gloves and punching bag. Pt needed mod A initially to maintain balance, but with practice, was able to stabilize self more consistently. Utilized alternating UE movements and blocking stance. OT placed target using tape on boxing bag to increase accuracy with R UE. In sitting, OT used bosu ball overhead to work on  B UE coordination. Pt returned to room and pivoted back to bed using bed rail and CGA. Pt left semi-reclined in bed with bed alarm on, call bell in reach, and needs met.   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

## 2022-01-22 NOTE — Progress Notes (Signed)
Occupational Therapy Session Note  Patient Details  Name: Hasheem Voland MRN: [id number] Date of Birth: May 09, 1946  Today's Date: 01/22/2022 OT Individual Time: 1100-1139 OT Individual Time Calculation (min): 39 min    Short Term Goals: Week 4:  OT Short Term Goal 1 (Week 4): STG = LTG d/t ELOS  Skilled Therapeutic Interventions/Progress Updates:    Pt received supine with SIL Amanda present cutting hair. Pt in no pain. Stand pivot transfer with mod A to a standard chair- worse truncal ataxia initially requiring increased assist. Pt sat while Amanda trimmed his hair and OT prepared items for shower. He completed ambulatory transfer to the toilet with min A using the RW. Min A for toileting tasks overall. + urine void. Pt then transferred into the shower. He sat on a bariatric BSC throughout shower and completed all bathing seated, including reaching posteriorly to wash his bottom. He required mod cueing for safety/impulsivity. He transferred back to EOB where he dressed. UB with (S), LB with mod A. Wet teds doffed and new ones donned with total A. Pt was left supine with all needs met, bed alarm set.   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

## 2022-01-22 NOTE — Patient Care Conference (Signed)
Inpatient RehabilitationTeam Conference and Plan of Care Update Date: 01/22/2022   Time: 10:02 AM    Patient Name: [name]      Medical Record Number: [id number]  Date of Birth: 1946-11-15 Sex: Male         Room/Bed: 4W16C/4W16C-01 Payor Info: Payor: MEDICARE / Plan: MEDICARE PART A AND B / Product Type: *No Product type* /    Admit Date/Time:  12/25/2021  3:05 PM  Primary Diagnosis:  Cerebellar infarction [facility])  Hospital Problems: Principal Problem:   Cerebellar infarction [facility]) Active Problems:   PAF (paroxysmal atrial fibrillation) ([place])   Hematuria    Expected Discharge Date: Expected Discharge Date: 01/30/22  Team Members Present: Physician leading conference: Dr. Alger Simons Social Worker Present: [name], [place] Nurse Present: Dorthula Nettles, RN PT Present: Canary Brim, PT OT Present: Willeen Cass, OT SLP Present: Sherren Kerns, SLP PPS Coordinator present : Gunnar Fusi, SLP     Current Status/Progress Goal Weekly Team Focus  Bowel/Bladder   Occasional incontinence of bowel and bladder LBM 2/5  Regain full continence  Coninue to assist wiht timed toilet during the day   Swallow/Nutrition/ Hydration   NPO with PEG, trials with SLP  Min A  Continue trials with SLP via tsp and Provale 5cc cup, pureed trials (seperate)   ADL's   (S) UB ADLs, min-mod A LB ADLs, min A transfers. Still very impulsive. Family education going well and pt progressing, improvements in UE ataxia  Supervision to CGA overall  Motor planning, sequencing, ADL retraining, transfers, family edu, d/c planning   Mobility   Min A overall using RW with infrequent mod A with impulsivity, gait >350 ft and 4 steps 1 rail, responds to clear, simple instructions prior to performing mobility to manage impulsivity  CGA-min A overall, 2 steps 1 rail  balance, safety awareness, impulse control, functional mobility, gait and stair training, activity tolerance, d/c planning, patient/caregiver  education   Communication             Safety/Cognition/ Behavioral Observations  mod-to-max; Cognition is improving in regard to attention, orientation and awareness but still with language of confusion and confabulation.  mod A  orientation, sustained attention, awareness   Pain   no c/o pain  remain pain free  Assess pain q shift and PRN   Skin   Peg tube site LUQ  remain free of new skin breakdown/infection  Assess skin q shift and PRN     Discharge Planning:  D/c to home with his wife and son who will provide 24/7 care. Wife works from home and has a flexible schedule in which she is able to still able to provide care to patient. Pt will d/c home with enteral feeds- [employer] to provide food (Nutren 1.5). [facility] for HHPT/OT/SLP/aide/SN (TF edu). SW will coordinate DME needs.   Team Discussion: More cognitively appropriate before dinner. Eliquis restarted, no more hematuria noted. Will consult IR about peg replacement. Will discontinue soft wrist restraints and apply Posey bed belt alarm to see if agitation improves. Incontinent bladder, no pain, incisions healing. Will remain on tube feeds at discharge.   Patient on target to meet rehab goals: yes, contact guard to min assist goals. Currently min assist transfers and gait. Mod assist for loss of balance. Cognition improving, still having language of confusion, and confabulation. MBS 01/18/2022 still showing intermittent signs of aspiration. Will continue to trial thins and puree. More for pleasure vs actual intake.  *See Care Plan and  progress notes for long and short-term goals.   Revisions to Treatment Plan:  Discontinuing restraints   Teaching Needs: Family education, bladder management, transfer/gait training, etc.  Current Barriers to Discharge: Decreased caregiver support, Incontinence, Lack of/limited family support, Medication compliance, Behavior, and impulsivity, safety.  Possible Resolutions to  Barriers: Continue time toileting Family education Posey bed belt      Medical Summary Current Status: improving behvior although he gets more agitated in late afternoon. cognition also better. still npo. hematuria resolved, reduced dose eliquis  Barriers to Discharge: Behavior;Medical stability   Possible Resolutions to [employer]: daily assessment of nutrition, labs, pt data   Continued Need for Acute Rehabilitation Level of Care: The patient requires daily medical management by a physician with specialized training in physical medicine and rehabilitation for the following reasons: Direction of a multidisciplinary physical rehabilitation program to maximize functional independence : Yes Medical management of patient stability for increased activity during participation in an intensive rehabilitation regime.: Yes Analysis of laboratory values and/or radiology reports with any subsequent need for medication adjustment and/or medical intervention. : Yes   I attest that I was present, lead the team conference, and concur with the assessment and plan of the team.   Cristi Loron 01/22/2022, 12:41 PM

## 2022-01-22 NOTE — Progress Notes (Signed)
Physical Therapy TBI Note  Patient Details  Name: [name] MRN: [id number] Date of Birth: 1946-05-27  Today's Date: 01/22/2022 PT Individual Time: 1445-1535 PT Individual Time Calculation (min): 50 min   Short Term Goals: Week 4:  PT Short Term Goal 1 (Week 4): STG=LTG due to ELOS  Skilled Therapeutic Interventions/Progress Updates:     Patient in bed with his wife, [name], at bedside upon PT arrival. Patient alert and agreeable to PT session. Patient denied pain during session.  Focused session on hands on training with [name] to continue to improve assist and cuing technique to promote patient and caregiver safety at d/c.  Therapeutic Activity: Bed Mobility: Patient performed supine to sit to the R with supervision with in a flat bed without use of bed rail to simulate home set-up and mod cues for bringing his L arm over to push up to sitting.  Transfers: Patient performed sit to/from stand from a hospital bed x2, toilet x2, and standard arm chair x3 with CGA using RW. Provided verbal cues for hand placement, forward weight shift, and backing-up and reaching back to sit for safety. Patient's wife able to teach-back 4 step cueing for "turning, backing-up, reaching back, and sitting." Cuing effective >75% of the time, otherwise, patient demonstrated impulsive sit with poor controlled descent with B hands on the RW.  Patient ambulated and transferred impulsively to the [facility] over the toilet due to urgency at beginning of session. Noted poor safety awareness and reduced safety with impulsivity. Patient was continent of loose BM during toileting and performed peri-care with total A and lower body clothing management with min A due to R hand coordination deficits. Upon returning to sitting EOB discussed use of BSC by the bed both in the room and at home for increased safety with urinary urgency, patient and his wife in agreement. Placed BSC by the bed for safety with toileting with urgency. [name]  cleared to assist patient with transfers to [facility], LPN made aware.    Gait Training:  Patient ambulated >150 feet x2 using RW with CGA and intermittent min A. Ambulated with decreased R step height and length with increased toe catching this session, increased R lean, poor safety awareness with use of RW (lifting off the floor), R LOB x1 and able to follow cues to stop and "reset" after for increased safety, and increased forward trunk lean and downward head gaze. Provided verbal cues for pushing down into the RW to maintain contact with the floor for safety, slow/controlled stepping to reduce LOB, and erect posture and looking ahead for improved spatial awareness and foot clearance. PT demonstrated stair technique piror to trials. Patient ascended/descended 4x6" steps x2 with seated rest break in between using B upper extremities on L rail to simulate home set-up with CGA-min A. Performed step-to gait pattern leading with R while ascending and L while descending with intermittent undershooting with R foot onto step. Provided mod cues for "slow controlled movement" for safety.   Patient's wife demonstrated safe guarding with transfers, gait, and stair training following PT demonstration during session. Suggesting use of w/c for transport from their car to the stairs due to various different unlevel surfaces along this path, patient and his wife in agreement. Discussed home set-up, fall risk/prevention, home modifications to prevent falls, and activation of emergency services in the event of a fall during session.   Will continue to focus on family training this week with focus on repetition of effective caregiver guarding and cues to improve  patient and caregiver safety with functional mobility at d/c.  Patient in bed with his wife at bedside at end of session with breaks locked, bed alarm set, and all needs within reach. Patient relieved to no longer require soft wrist restraints. Reviewed need for having  [name] or nursing staff assist with all mobility. Patient demonstrated how to call for assistance if [name] is not in the room using the call bell with mod cues, will need reinforcement, LPN made aware.   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    Therapy/Group: Individual Therapy  Aqeel Norgaard L Aviannah Castoro PT, DPT  01/22/2022, 7:20 PM

## 2022-01-22 NOTE — Progress Notes (Signed)
Patient ID: [name], male   DOB: Sep 11, 1946, 76 y.o.   MRN: [id number]  SW spoke with pt wife to provide updates from team conference, and inform on d/c remains 2/15. Discussed family education, [facility], and enteral feeds with [employer]. Family education scheduled for Friday 2/10 1pm-4pm. SW will order DME recommended.   SW ordered DME with [employer]: RW, 3in1 BSC, and w/c.   [name], MSW, [place] Office: [phone] Cell: [phone] Fax: [phone]

## 2022-01-23 ENCOUNTER — Telehealth: Payer: Self-pay

## 2022-01-23 NOTE — Progress Notes (Signed)
PROGRESS NOTE   Pt had a good night out of restraints. Not even waist belt applied. Did have small amount of hematuria this morning with clot  ROS: Limited due to cognitive/behavioral    Objective:   No results found. Recent Labs    01/21/22 0654  WBC 6.8  HGB 11.7*  HCT 35.9*  PLT 290     Recent Labs    01/21/22 0654  NA 140  K 3.8  CL 104  CO2 26  GLUCOSE 142*  BUN 19  CREATININE 0.90  CALCIUM 8.7*       Intake/Output Summary (Last 24 hours) at 01/23/2022 0849 Last data filed at 01/23/2022 0700 Gross per 24 hour  Intake 160 ml  Output --  Net 160 ml             Physical Exam: Vital Signs Blood pressure 125/62, pulse (!) 58, temperature 98.1 F (36.7 C), resp. rate 18, height 5\' 10"  (1.778 m), weight 85.4 kg, SpO2 97 %. Constitutional: No distress . Vital signs reviewed. HEENT: NCAT, EOMI, oral membranes moist Neck: supple Cardiovascular: RRR without murmur. No JVD    Respiratory/Chest: CTA Bilaterally without wheezes or rales. Normal effort    GI/Abdomen: BS +, non-tender, non-distended Ext: no clubbing, cyanosis, or edema Psych: pleasant and cooperative  Skin: intact Neurologic: Cranial nerves II through XII intact, motor strength is 5/5 in bilateral deltoid, bicep, tricep, grip, hip flexor, knee extensors, ankle dorsiflexor and plantar flexor GU; urine yellow, clear Skin: No evidence of breakdown, no evidence of rash.   Neuro:  .alert, oriented to person, hospital, still with limited insight.    follows basic commands. No resting tone. Sensory exam grossly intact. Cooperative  Musculoskeletal: no jt pain   Assessment/Plan: 1. Functional deficits which require 3+ hours per day of interdisciplinary therapy in a comprehensive inpatient rehab setting. Physiatrist is providing close team supervision and 24 hour management of active medical problems listed below. Physiatrist and rehab team  continue to assess barriers to discharge/monitor patient progress toward functional and medical goals  Care Tool:  Bathing    Body parts bathed by patient: Right arm, Left arm, Right lower leg, Left lower leg, Face, Chest, Abdomen, Front perineal area, Buttocks, Right upper leg, Left upper leg   Body parts bathed by helper: Right arm, Left arm, Chest, Abdomen     Bathing assist Assist Level: Minimal Assistance - Patient > 75%     Upper Body Dressing/Undressing Upper body dressing   What is the patient wearing?: Pull over shirt    Upper body assist Assist Level: Supervision/Verbal cueing    Lower Body Dressing/Undressing Lower body dressing      What is the patient wearing?: Pants, Incontinence brief     Lower body assist Assist for lower body dressing: Moderate Assistance - Patient 50 - 74%     Toileting Toileting    Toileting assist Assist for toileting: Moderate Assistance - Patient 50 - 74%     Transfers Chair/bed transfer  Transfers assist     Chair/bed transfer assist level: Contact Guard/Touching assist Chair/bed transfer assistive device: Programmer, multimedia   Ambulation assist   Ambulation activity  did not occur: Safety/medical concerns  Assist level: Minimal Assistance - Patient > 75% Assistive device: Walker-rolling Max distance: >150 ft   Walk 10 feet activity   Assist  Walk 10 feet activity did not occur: Safety/medical concerns  Assist level: Minimal Assistance - Patient > 75% Assistive device: Walker-rolling   Walk 50 feet activity   Assist Walk 50 feet with 2 turns activity did not occur: Safety/medical concerns  Assist level: Minimal Assistance - Patient > 75% Assistive device: Walker-rolling    Walk 150 feet activity   Assist Walk 150 feet activity did not occur: Safety/medical concerns  Assist level: Minimal Assistance - Patient > 75% Assistive device: Walker-rolling    Walk 10 feet on uneven surface   activity   Assist Walk 10 feet on uneven surfaces activity did not occur: Safety/medical concerns         Wheelchair     Assist Is the patient using a wheelchair?: Yes Type of Wheelchair: Manual Wheelchair activity did not occur: Safety/medical concerns (limited by cognitive deficits)  Wheelchair assist level: Supervision/Verbal cueing Max wheelchair distance: 57 ft    Wheelchair 50 feet with 2 turns activity    Assist    Wheelchair 50 feet with 2 turns activity did not occur: Safety/medical concerns   Assist Level: Supervision/Verbal cueing   Wheelchair 150 feet activity     Assist  Wheelchair 150 feet activity did not occur: Safety/medical concerns       Blood pressure 125/62, pulse (!) 58, temperature 98.1 F (36.7 C), resp. rate 18, height 5\' 10"  (1.778 m), weight 85.4 kg, SpO2 97 %.    Medical Problem List and Plan: 1. 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  Status post posterior decompression with craniectomy 11/26/2021             -patient may not yet shower             -ELOS/Goals:   2/15.  supervision to min assist goals   -Continue CIR therapies including PT, OT, and SLP  2.  Antithrombotics: -DVT/anticoagulation:  Pharmaceutical: Other (comment) Eliquis             -antiplatelet therapy: N/A 3. Pain Management: Tizanidine no longer ordered, Voltaren gel 4 times daily, Fioricet as needed, oxycodone as needed 4. Mood/sleep:                 -antipsychotic agents: seroquel               - sleep chart    - continue seroquel 50 mg qam and 150mg  qhs  -continue HS celexa for sleep and mood stabilization  -rx'ed UTI 2/8 agitation getting better. Did well without any restraints last night  -continue telesiter only for now  -d/w nurse. Needs close observation for now 5. Neuropsych: This patient is not capable of making decisions on his own behalf.  - telesitter  ongoing-  6. Skin/Wound Care: Routine skin checks 7. Fluids/Electrolytes/Nutrition:   Continue foley for PEG as long as it continues to function. If he needs it long term will have balloon bumper feeding tube inserted  - on bolus feeds   - bolus schedule was adjusted, RD added further volume to TF 8.  Prolonged ventilatory support.  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     Decannulated 1/16---occlusive dressing to trach stoma-  closed 9.  Dysphagia.  Status post gastrostomy tube 12/13/2021 per Dr.Lovick-  -continue NPO, trials  with SLP only. 10.  Aspiration pneumonia.  Antibiotic therapy completed.  -afebrile  -oob, oxygenation improved 11.  New onset atrial fibrillation.  Started Eliquis 12/17/2021.  Continue low-dose beta-blocker.    HR controlled  -appreciate cardiology consult. Discussed case with Dr. Gardiner Rhyme today. Resumed eliquis 2.5mg  bid  2/8 had some hematuria again this morning  -may need to go to asa/plavix--observe today  -potential Watchman LA appendage closure device placement? Vitals:   01/22/22 2017 01/23/22 0435  BP: 127/64 125/62  Pulse: 60 (!) 58  Resp: 18 18  Temp: 98 F (36.7 C) 98.1 F (36.7 C)  SpO2: 97% 97%    12.  Hypertension.  Continue Norvasc 10 mg daily, Cozaar 50 mg daily, , clonidine 0.1 mg every 8 hours.  ,  2/3-2/4 BP controlled. Clonidine d/ced 2/3  2/8 off norvasc too.  13.  Hyperlipidemia.  Lipitor 14.  History of BPH/urinary retention.        -generally has been voiding but is incontinent 15.  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  Follow-up per cardiology services. 16. 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 Has extensive hx per wife/son:     eliquis resumed at low dose 2/3 - 100k E coli UTI was rx'ed with keflex  -hgb improved to 11.7 2/6  -urology has seen, ?finasteride if can take po  -see above discussion, no discomfort or residuals    LOS: 29 days A FACE TO FACE EVALUATION WAS  PERFORMED  Meredith Staggers 01/23/2022, 8:49 AM

## 2022-01-23 NOTE — Progress Notes (Signed)
Physical Therapy Session Note  Patient Details  Name: [name] MRN: [id number] Date of Birth: 03-Feb-1946  Today's Date: 01/23/2022 PT Individual Time: 7672-0947 PT Individual Time Calculation (min): 60 min   Short Term Goals: Week 4:  PT Short Term Goal 1 (Week 4): STG=LTG due to ELOS  Skilled Therapeutic Interventions/Progress Updates: Pt presented in bed awake and agreeable to therapy. Pt denies pain during session. PTA donned TED hose total A and pt then performed supine to sit with supervision and use of bed features. PTA threaded pants and pt then donned shoes with supervision. Pt stood and pulled up pants with minA. Pt noted to move impulsively when pulling up pants with increased posterior sway noted. Pt then ambulated to bathroom with RW and CGA was able to manage clothing wiith CGA and sat at toilet for continent urinary void. Pt noted to have moderate amount of blood in urine, nsg notified and aware. Pt then ambulated to w/c at sink and performed oral hygiene with supervision. Once completed pt propelled w/c to rehab gym with supervision and use of BUE for coordination and general conditioning. Pt then ambulated ~190ft with RW and CGA fading to minA nearing end due to decreased RLE awareness causing R foot to catch. Pt noted with increased dyspnea after ambulation which resolved with seated rest. Pt then participated in stepping over cones for emphasis on increased step height of RLE and coordination. Pt also able to complete ascending/descending 4 steps x 2 with L rail to simulate home environment. Pt required mod multimodal cues for pacing as pt initially impulsive and attempting to ascend quickly. Pt received seated rest between bouts. Pt participated in standing balance activity reaching for cards and placed on board. Pt was able to place 6/9 cards accurately then pt indicated urgent need for bathroom. Pt required mod/max cues to return to w/c as pt began ambulating towards gym door.  Once in w/c pt transported back to room due to urgency. In room pt performed ambulatory transfer to bathroom and was able required modA for LB clothing management due to urgency/impulsiveness. Pt with continent void at toilet. Pt stood from toilet with CGA and MinA provided for clothing management. Pt ambulated back to w/c and noted that pt's sister present. Pt left in w/c at end of session with belt alarm on, call bell within reach and Arthur, SLP arriving in room for next session.      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 General:   Vital Signs: Therapy Vitals Temp: (!) 97.5 F (36.4 C) Pulse Rate: (!) 59 Resp: 19 BP: (!) 99/55 Patient Position (if appropriate): Lying Oxygen Therapy SpO2: 96 % O2 Device: Room Air Pain:   Mobility:   Locomotion :    Trunk/Postural Assessment :    Balance:   Exercises:   Other Treatments:      Therapy/Group: Individual Therapy  [place] 01/23/2022, 4:23 PM

## 2022-01-23 NOTE — Progress Notes (Signed)
Speech Language Pathology Weekly Progress and Session Note  Patient Details  Name: [name] MRN: [id number] Date of Birth: 11-17-1946  Beginning of progress report period: January 16, 2022 End of progress report period: January 23, 2022  Today's Date: 01/23/2022 SLP Individual Time: 4098-1191 SLP Individual Time Calculation (min): 55 min  Short Term Goals: Week 4: SLP Short Term Goal 1 (Week 4): STGs=LTGs due to ELOS SLP Short Term Goal 1 - Progress (Week 4): Not met    New Short Term Goals: Week 5: SLP Short Term Goal 1 (Week 5): STGs=LTGs due to ELOS  Weekly Progress Updates: Patient continues to make slow but functional gains. Currently, patient continues to demonstrate intermittent language of confusion and confabulation but is more easily redirected with increased awareness. Patient also demonstrates decreased impulsivity and requires overall Mod A multimodal cues to complete basic and familiar tasks safely in regards to problem solving, attention, and awareness. Patient also demonstrates improved orientation and recall of daily information. Patient had repeat MBS which shoes a slight improvement in swallowing function but overall continues to show moderate pharyngeal residue with intermittent sensed aspiration. SLP is providing ongoing trials of thin liquids with possible use of a Provale cup and pureed textures with focus on use of swallowing compensatory strategies and tolerance to determine potential for "snacks" at home. However, patient will remain overall NPO with alternative means of nutrition at discharge. Patient and family education ongoing. Patient would benefit from continued skilled SLP intervention to maximize his cognitive and swallowing function prior to discharge.      Intensity: Minumum of 1-2 x/day, 30 to 90 minutes Frequency: 3 to 5 out of 7 days Duration/Length of Stay: 01/30/22 Treatment/Interventions: Cognitive remediation/compensation;Internal/external  aids;Speech/Language facilitation;Therapeutic Activities;Environmental controls;Cueing hierarchy;Functional tasks;Patient/family education;Dysphagia/aspiration precaution training   Daily Session  Skilled Therapeutic Interventions: Skilled treatment session focused on cognitive and dysphagia goals. Patient received upright in the wheelchair from PT. Patient performed oral care via the suction toothbrush with set-up assist. Patient consumed trials of ice chips and thin liquids via tsp with overt cough X 1 with Mod verbal cues needed for use of swallowing compensatory strategies. Patient consumed several trials of thin liquids via a 5cc Provale cup with consistent overt s/s of aspiration across all trials. Recommend ongoing trials with SLP. Patient continues to demonstrate increase awareness regarding current deficits but requires education for emergent awareness. Patient with intermittent language of confusion and confabulation that is easily redirected. Patient also with increased sustained attention to tasks. Patient left upright in wheelchair with alarm on and sister present. Continue with current plan of care.      Pain No/Denies Pain   Therapy/Group: Individual Therapy  Camy Leder, [place] 01/23/2022, 3:59 PM

## 2022-01-23 NOTE — Telephone Encounter (Signed)
Per Dr. Gardiner Rhyme, scheduled the patient for [facility] consult with Dr. Quentin Ore on 02/26/2022.  The patient's wife was grateful for call and agrees with plan.

## 2022-01-23 NOTE — Progress Notes (Signed)
Occupational Therapy Session Note  Patient Details  Name: [name] MRN: [id number] Date of Birth: November 05, 1946  Today's Date: 01/23/2022 OT Individual Time: 1300-1410 OT Individual Time Calculation (min): 70 min   17 ABS Short Term Goals: Week 1:  OT Short Term Goal 1 (Week 1): Pt will attend to ADL task for 1 min with min cueing OT Short Term Goal 1 - Progress (Week 1): Met OT Short Term Goal 2 (Week 1): Pt will don shirt with min A OT Short Term Goal 2 - Progress (Week 1): Met OT Short Term Goal 3 (Week 1): Pt will complete toilet transfer with LRAD with mod A OT Short Term Goal 3 - Progress (Week 1): Progressing toward goal OT Short Term Goal 4 (Week 1): Pt will complete bimanual grooming tasks with min A OT Short Term Goal 4 - Progress (Week 1): Met  Skilled Therapeutic Interventions/Progress Updates:     Pt received in w/c with sister present with no pain.   ADL: Pt completes ADL at overall MIN Level. Skilled interventions include: VC for slow pace with mobility/keeping RW on the ground. Pt is more impulsive today with movement attempting to stand from toilet prior to alerting OT needing to stand. Pt able to complete 3/3 components of toileting with MIN A with VC for posterior lean correction. Pt washes hands at sink seated.   Therapeutic activity BITS activities seated for functional reach, visual scanning and cognition Memory sequence up to 5 1-26 stimuli with rotation for visual pursuits with 3.5 sec reaction time and 75% accuracy d/t ataxia Coordinate puzzle 3x with total cuing for instructions fading to MOD!  Pt completes standing pipe tree activity simple with MIN A working visual memory with only 1 cue, and standing with visual reference for moderately complex figure with no cuing and MIN A for balance only. Pt with extreme terminal hip extension stabilizing hips on table while working with BUE despite VC for posture.   Pt left at end of session in bed with exit alarm  on, call light in reach and all needs met   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 General:     Therapy/Group: Individual Therapy  Tonny Branch 01/23/2022, 6:51 AM

## 2022-01-23 NOTE — Progress Notes (Signed)
Patient slept majority of the night. Pt was calm and pleasant, no agitation during this tour. Patient noted to have hematuria this morning- small amount with small clots. Pt denies any pain or discomfort during voiding. Will address with MD on next round.

## 2022-01-24 MED ORDER — JEVITY 1.5 CAL/FIBER PO LIQD
356.0000 mL | Freq: Four times a day (QID) | ORAL | Status: DC
  Administered 2022-01-24 – 2022-02-08 (×59): 356 mL
  Filled 2022-01-24 (×2): qty 1000
  Filled 2022-01-24 (×4): qty 474
  Filled 2022-01-24: qty 1000
  Filled 2022-01-24 (×2): qty 474
  Filled 2022-01-24: qty 1000
  Filled 2022-01-24: qty 474
  Filled 2022-01-24 (×2): qty 1000
  Filled 2022-01-24 (×4): qty 474
  Filled 2022-01-24: qty 1000
  Filled 2022-01-24 (×4): qty 474
  Filled 2022-01-24: qty 1000
  Filled 2022-01-24: qty 474
  Filled 2022-01-24: qty 1000
  Filled 2022-01-24 (×3): qty 474
  Filled 2022-01-24: qty 1000
  Filled 2022-01-24: qty 474
  Filled 2022-01-24: qty 1000
  Filled 2022-01-24: qty 474
  Filled 2022-01-24: qty 1000
  Filled 2022-01-24 (×4): qty 474
  Filled 2022-01-24: qty 1000
  Filled 2022-01-24: qty 474
  Filled 2022-01-24: qty 1000
  Filled 2022-01-24 (×4): qty 474
  Filled 2022-01-24: qty 1000
  Filled 2022-01-24: qty 474
  Filled 2022-01-24 (×2): qty 1000
  Filled 2022-01-24 (×6): qty 474
  Filled 2022-01-24 (×3): qty 1000
  Filled 2022-01-24 (×6): qty 474

## 2022-01-24 MED ORDER — PROSOURCE TF PO LIQD
45.0000 mL | Freq: Three times a day (TID) | ORAL | Status: DC
  Administered 2022-01-24 – 2022-02-08 (×45): 45 mL
  Filled 2022-01-24 (×45): qty 45

## 2022-01-24 NOTE — Progress Notes (Signed)
PROGRESS NOTE   No issues last night without restraints. No agitation reported. Continued hematuria unfortunately  ROS: Limited due to cognitive/behavioral    Objective:   No results found. No results for input(s): WBC, HGB, HCT, PLT in the last 72 hours.    No results for input(s): NA, K, CL, CO2, GLUCOSE, BUN, CREATININE, CALCIUM in the last 72 hours.     No intake or output data in the 24 hours ending 01/24/22 0956            Physical Exam: Vital Signs Blood pressure 140/79, pulse 73, temperature 98.7 F (37.1 C), resp. rate 18, height 5\' 10"  (1.778 m), weight 87.6 kg, SpO2 99 %. Constitutional: No distress . Vital signs reviewed. HEENT: NCAT, EOMI, oral membranes moist Neck: supple Cardiovascular: RRR without murmur. No JVD    Respiratory/Chest: CTA Bilaterally without wheezes or rales. Normal effort    GI/Abdomen: BS +, non-tender, non-distended Ext: no clubbing, cyanosis, or edema Psych: pleasant and cooperative  Skin: intact Neurologic: Cranial nerves II through XII intact, motor strength is 5/5 in bilateral deltoid, bicep, tricep, grip, hip flexor, knee extensors, ankle dorsiflexor and plantar flexor GU; urine red Skin: No evidence of breakdown, no evidence of rash.   Neuro:  .alert, oriented to person, hospital, still with limited insight.    follows basic commands. No resting tone. Sensory exam grossly intact. Truncal/limb ataxia. Staggering gait at times Musculoskeletal: no jt pain   Assessment/Plan: 1. Functional deficits which require 3+ hours per day of interdisciplinary therapy in a comprehensive inpatient rehab setting. Physiatrist is providing close team supervision and 24 hour management of active medical problems listed below. Physiatrist and rehab team continue to assess barriers to discharge/monitor patient progress toward functional and medical goals  Care Tool:  Bathing    Body  parts bathed by patient: Right arm, Left arm, Right lower leg, Left lower leg, Face, Chest, Abdomen, Front perineal area, Buttocks, Right upper leg, Left upper leg   Body parts bathed by helper: Right arm, Left arm, Chest, Abdomen     Bathing assist Assist Level: Minimal Assistance - Patient > 75%     Upper Body Dressing/Undressing Upper body dressing   What is the patient wearing?: Pull over shirt    Upper body assist Assist Level: Supervision/Verbal cueing    Lower Body Dressing/Undressing Lower body dressing      What is the patient wearing?: Pants, Incontinence brief     Lower body assist Assist for lower body dressing: Moderate Assistance - Patient 50 - 74%     Toileting Toileting    Toileting assist Assist for toileting: Moderate Assistance - Patient 50 - 74%     Transfers Chair/bed transfer  Transfers assist     Chair/bed transfer assist level: Contact Guard/Touching assist Chair/bed transfer assistive device: Programmer, multimedia   Ambulation assist   Ambulation activity did not occur: Safety/medical concerns  Assist level: Minimal Assistance - Patient > 75% Assistive device: Walker-rolling Max distance: >150 ft   Walk 10 feet activity   Assist  Walk 10 feet activity did not occur: Safety/medical concerns  Assist level: Minimal Assistance - Patient > 75% Assistive  device: Walker-rolling   Walk 50 feet activity   Assist Walk 50 feet with 2 turns activity did not occur: Safety/medical concerns  Assist level: Minimal Assistance - Patient > 75% Assistive device: Walker-rolling    Walk 150 feet activity   Assist Walk 150 feet activity did not occur: Safety/medical concerns  Assist level: Minimal Assistance - Patient > 75% Assistive device: Walker-rolling    Walk 10 feet on uneven surface  activity   Assist Walk 10 feet on uneven surfaces activity did not occur: Safety/medical concerns          Wheelchair     Assist Is the patient using a wheelchair?: Yes Type of Wheelchair: Manual Wheelchair activity did not occur: Safety/medical concerns (limited by cognitive deficits)  Wheelchair assist level: Supervision/Verbal cueing Max wheelchair distance: 57 ft    Wheelchair 50 feet with 2 turns activity    Assist    Wheelchair 50 feet with 2 turns activity did not occur: Safety/medical concerns   Assist Level: Supervision/Verbal cueing   Wheelchair 150 feet activity     Assist  Wheelchair 150 feet activity did not occur: Safety/medical concerns       Blood pressure 140/79, pulse 73, temperature 98.7 F (37.1 C), resp. rate 18, height 5\' 10"  (1.778 m), weight 87.6 kg, SpO2 99 %.    Medical Problem List and Plan: 1. 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  Status post posterior decompression with craniectomy 11/26/2021             -patient may not yet shower             -ELOS/Goals:   2/15.  supervision to min assist goals   -Continue CIR therapies including PT, OT, and SLP   2.  Antithrombotics: -DVT/anticoagulation:  Pharmaceutical: Other (comment) Eliquis             -antiplatelet therapy: N/A 3. Pain Management: Tizanidine no longer ordered, Voltaren gel 4 times daily, Fioricet as needed, oxycodone as needed 4. Mood/sleep:                 -antipsychotic agents: seroquel               - sleep chart    - continue seroquel 50 mg qam and 150mg  qhs  -continue HS celexa for sleep and mood stabilization  -rx'ed UTI 2/9 agitation improved. Doing well without restraints  -continue telesiter only for now   -continue close observation for now 5. Neuropsych: This patient is not capable of making decisions on his own behalf.  - telesitter ongoing-  6. Skin/Wound Care: Routine skin checks 7. Fluids/Electrolytes/Nutrition:   Continue foley for PEG as long as it continues to  function. If he needs it long term will have balloon bumper feeding tube inserted  - on bolus feeds   - bolus schedule was adjusted, RD added further volume to TF 8.  Prolonged ventilatory support.  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     Decannulated 1/16---occlusive dressing to trach stoma-  closed 9.  Dysphagia.  Status post gastrostomy tube 12/13/2021 per Dr.Lovick-  -continue NPO, trials with SLP only. 10.  Aspiration pneumonia.  Antibiotic therapy completed.  -afebrile  -oob, oxygenation improved 11.  New onset atrial fibrillation.  Started Eliquis 12/17/2021.  Continue low-dose beta-blocker.    HR controlled  -appreciate cardiology consult. Discussed case with Dr. Gardiner Rhyme today.    2/9 continued hematuria   -hold  eliquis today   -consider asa/plavix tomorrow   -will notify cards  -may need to go to asa/plavix--observe today  -potential Watchman LA appendage closure device placement? Vitals:   01/23/22 1936 01/24/22 0514  BP: 121/67 140/79  Pulse: 72 73  Resp: 16 18  Temp: 97.8 F (36.6 C) 98.7 F (37.1 C)  SpO2: 100% 99%    12.  Hypertension.  Continue Norvasc 10 mg daily, Cozaar 50 mg daily, , clonidine 0.1 mg every 8 hours.  ,  2/3-2/4 BP controlled. Clonidine d/ced 2/3  2/9 off norvasc too.  13.  Hyperlipidemia.  Lipitor 14.  History of BPH/urinary retention.        -generally has been voiding but is incontinent 15.  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  Follow-up per cardiology services. 16. 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 Has extensive hx per wife/son:     eliquis resumed at low dose 2/3 - 100k E coli UTI was rx'ed with keflex  -hgb improved to 11.7 2/6  -urology has seen, ?finasteride if can take po  -2/9 recurrent hematuria---dc eliquis   -start plavix/asa tomorrow?   -recheck hgb tomorrow    LOS: 30 days A FACE TO FACE EVALUATION WAS PERFORMED  Meredith Staggers 01/24/2022, 9:56 AM

## 2022-01-24 NOTE — Progress Notes (Signed)
Nutrition Follow-up  DOCUMENTATION CODES:   Not applicable  INTERVENTION:  Provide bolus feeds via PEG using Jevity 1.5 cal formula at new volume of 356 ml (1.5 cartons/ARCS, 6 total cartons per day) given QID.   Provide 45 ml Prosource TF TID per tube.   Free water flushes of 200 ml q 6 hours per tube.   Tube feeding regimen to provide 2256 kcal, 124 grams protein, and 1882 ml free water.   Upon discharge home, may substitute formula with Nutren 1.5 cal if unable to obtain Jevity 1.5 cal.  NUTRITION DIAGNOSIS:   Inadequate oral intake related to inability to eat as evidenced by NPO status; ongoing  GOAL:   Patient will meet greater than or equal to 90% of their needs; met with TF  MONITOR:   Labs, Weight trends, TF tolerance, Skin, I & O's  REASON FOR ASSESSMENT:   Consult Enteral/tube feeding initiation and management  ASSESSMENT:   76 year old male with history of CAD status post CABG x4 2012, hyperlipidemia, nephrolithiasis, BPH/prostate cancer. Presented 11/25/2021 with altered mental status as well as dizziness with ataxic gait and diaphoretic. 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 Pt underwent suboccipital decompressive craniectomy 11/26/2021. Pt intubated for prolonged time underwent tracheostomy 12/10/2021 and PEG tube placement 12/13/2021. Pt with decreased functional mobility and admitted to CIR.  PEG placed prior to CIR admission 12/13/21 Trach decannulated 1/16.   Pt reports emesis daily while on tube feeds, volume of bolus may be too large at feedings. RD to modify tube feeding orders and decrease volume given at feeds to aid in better tolerance. Tube feeds to continue to provide 100% of calorie and protein needs. Labs and medications reviewed.   Diet Order:   Diet Order             Diet NPO time specified  Diet effective midnight                   EDUCATION NEEDS:   Not appropriate for education at this time  Skin:  Skin  Assessment: Reviewed RN Assessment Skin Integrity Issues:: Incisions Incisions: head  Last BM:  2/8  Height:   Ht Readings from Last 1 Encounters:  12/25/21 _0  (1.778 m)    Weight:   Wt Readings from Last 1 Encounters:  01/24/22 87.6 kg   BMI:  Body mass index is 27.71 kg/m.  Estimated Nutritional Needs:   Kcal:  2200-2400 kcal/d  Protein:  115-125 grams  Fluid:  >/= 2 L/day  Corrin Parker, MS, RD, LDN RD pager number/after hours weekend pager number on Amion.

## 2022-01-24 NOTE — Progress Notes (Signed)
Physical Therapy Session Note  Patient Details  Name: [name] MRN: [id number] Date of Birth: [birth year]  Today's Date: 01/24/2022 PT Individual Time: 1100-1200 PT Individual Time Calculation (min): 60 min   Short Term Goals: Week 1:  PT Short Term Goal 1 (Week 1): Patient will perform bed mobility with mod A in a flat bed without rails. PT Short Term Goal 1 - Progress (Week 1): Progressing toward goal PT Short Term Goal 2 (Week 1): Patient will perform basic transfers with mod A consistently. PT Short Term Goal 2 - Progress (Week 1): Progressing toward goal PT Short Term Goal 3 (Week 1): Patient will initiate gait training >10 feet. PT Short Term Goal 3 - Progress (Week 1): Met Week 2:  PT Short Term Goal 1 (Week 2): Pt will perform supine<>sit with mod assist consistently PT Short Term Goal 1 - Progress (Week 2): Met PT Short Term Goal 2 (Week 2): Pt will perform sit<>stand with mod assist of 1 consistently PT Short Term Goal 2 - Progress (Week 2): Met PT Short Term Goal 3 (Week 2): Pt will perform bed<>chair transfers with mod assist of 1 consistently PT Short Term Goal 3 - Progress (Week 2): Met PT Short Term Goal 4 (Week 2): Pt will ambulate at least 25f using LRAD with mod assist of 1 and +2 min assist if needed PT Short Term Goal 4 - Progress (Week 2): Met Week 3:  PT Short Term Goal 1 (Week 3): Patient will perform basic transfers with min A consistently. PT Short Term Goal 1 - Progress (Week 3): Met PT Short Term Goal 2 (Week 3): Patient will ambulate mod-min A without overt LOB using LRAD >200 ft. PT Short Term Goal 2 - Progress (Week 3): Met PT Short Term Goal 3 (Week 3): Patient will perform 4 steps using 1 rail with min A of 1 person. PT Short Term Goal 3 - Progress (Week 3): Progressing toward goal  Skilled Therapeutic Interventions/Progress Updates:     Therapy Documentation Precautions:  Precautions Precautions: Fall, Other (comment) Precaution Comments: PEG,  B wrist restraints, trach removed on 1/16, truncal ataxia Restrictions Weight Bearing Restrictions: No Other Position/Activity Restrictions: Wears a lift in R shoe s/p hip replacement on L causing leg length discrepency  Pt initially sleeping in bed.  Awakens easily to voice.   Supine to sit w/verbal cues for sequencing, use of rail, min assist for balance. Pt requested to use BR, moves very impulsively when not carefully/calmly cued for safe pace of movement.  Sit to stand w/min assist for stability to Rw. Gait 176fx 2 to/from commode w/min to heavy mod assist due to balance loss w/turning/impulsivity, max assist for management of clothing, heavy cueing to safety w/turn/sit to commode using grab bar for added stability.  BP following activity 109/74  Sit to stand from bed w/min assist, cues for pacing/safety. Gait 15066f/RW, min assist, cues for attention to posture/RLE clearance/placement/pacing.  Sidestepping using handrail for support 52f70f2 each direction, cga to L, min and heavy cueing to R, RLE instability at times but no buckling, cues for attention/pacing/R foot placement.  Gait 125ft36fW as above.  Seated peg board using R hand to place pegs in single row for coordination task during rest break.  Standing w/single UE support on RW, pt raised bocci ball using R hand first overhead x 5 then diagonally/overhead x 5 w/cues for pacing, cga for balance, focus on stability in standing.   Pt rested in  sitting x 2-3 min then repeated using LUE, improved coordination and stability vs when using RUE.  Gait 131f to room including turns, cues as above, level of external distractability increasing.   Turn/sit to bed w/cues as above.  Sit to supine w/cues for sequencing/safety. Pt left supine w/rails up x 4 for safety, alarm set, bed in lowest position, fall mats at bedside, and needs in reach.   Therapy/Group: Individual Therapy BCallie Fielding POwatonna2/08/2022, 12:47 PM

## 2022-01-24 NOTE — Progress Notes (Signed)
Occupational Therapy Session Note  Patient Details  Name: [name] MRN: [id number] Date of Birth: October 20, 1946  Session 1: 800-915 75 min Session 2: 1330-1430 60 min  Short Term Goals: Week 5:  OT Short Term Goal 1 (Week 5): Continue progressing toward established LTG  Skilled Therapeutic Interventions/Progress Updates:    Session 1:   Pt received in bed with no pain and son present at end of session ADL: Pt completes ADL at overall MIN A Level. Skilled interventions include: VC for pacing/hand placement throughout transitional movements, intermittent input at R knee for WB to decrease extension when sit>stand, direct cuing to use soap in shower after pt rinses body and attempts to termiate bathing, changing peg dressing after shower, and application of ted hose. Pt requires CGA during threading of BLE into pants and mod HOH A for combing hair d/t ataxia and facilitation of prolonged gross grasp. Pt would likely benefit from larger handle.    Therapeutic activity Standing balance activities with min overall and intermittent CGA for balance while completing the following tasks in standing with 2# ewrist weights: Card sort/match reaching mod ranges outside BOS Folding towels Applying pillow cases to pillows  Pt left at end of session in bed with exit alarm on, call light in reach and all needs met.  Session 2:  Pt received in bed with no pain ADL: Pt completes toileting at ambulatory level with RW and MIN A Overall with minor perturbations requiring skilled handling to prevent LOB. Noticeable improvement of sit to stand transitions with decreased retropulsion this session. Pt able to void bladder on toilet with MIN A for balance during clothing management.    Therapeutic activity Pt requesting to go outside and propels w/c ~50 feet each direction for activity tolerance and attention. Attempted memory task to recall 3 UB dowel exericses in order (shoulder flexion, chest press and  bidep curl 3x) however pt only able to recall 1/3 consistently. Pt completes 3x1 min beach ball volley in seated position with 2 # dowel rod for dynamic balance, postural control, BUE strengthening and endurance required for BADLs and functional transfers.    Pt left at end of session in bed with exit alarm on, call light in reach and all needs met   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  Therapy/Group: Individual Therapy  Tonny Branch 01/24/2022, 6:44 AM

## 2022-01-25 LAB — CBC
HCT: 34.6 % — ABNORMAL LOW (ref 39.0–52.0)
Hemoglobin: 11.2 g/dL — ABNORMAL LOW (ref 13.0–17.0)
MCH: 30.6 pg (ref 26.0–34.0)
MCHC: 32.4 g/dL (ref 30.0–36.0)
MCV: 94.5 fL (ref 80.0–100.0)
Platelets: 315 10*3/uL (ref 150–400)
RBC: 3.66 MIL/uL — ABNORMAL LOW (ref 4.22–5.81)
RDW: 15.9 % — ABNORMAL HIGH (ref 11.5–15.5)
WBC: 5.8 10*3/uL (ref 4.0–10.5)
nRBC: 0 % (ref 0.0–0.2)

## 2022-01-25 LAB — BASIC METABOLIC PANEL
Anion gap: 7 (ref 5–15)
BUN: 21 mg/dL (ref 8–23)
CO2: 27 mmol/L (ref 22–32)
Calcium: 8.7 mg/dL — ABNORMAL LOW (ref 8.9–10.3)
Chloride: 104 mmol/L (ref 98–111)
Creatinine, Ser: 0.85 mg/dL (ref 0.61–1.24)
GFR, Estimated: >60 mL/min (ref >60)
Glucose, Bld: 111 mg/dL — ABNORMAL HIGH (ref 70–99)
Potassium: 4.4 mmol/L (ref 3.5–5.1)
Sodium: 138 mmol/L (ref 135–145)

## 2022-01-25 NOTE — Progress Notes (Signed)
While showering, patient became dizzy while standing and had some unsteadiness in his gait. Patient hit his head twice on the marble tiles in the shower. Site assessed no sign of injury. Patient's BP was 74/56 while seated in shower. Patient also vomited. Once moved back into bed, patient's blood pressure returned to 130/111. Given Zofran IM for nausea.     Gladstone Lighter, LPN

## 2022-01-25 NOTE — Progress Notes (Signed)
Patient ID: [name], male   DOB: 12-26-1945, 76 y.o.   MRN: [id number]  SW faxed over clinicals for enteral feeds to Tyler wit [facility].   SW spoke with [name]/[facility] enteral feed dept [phone] option #5, ext [phone]/f: [phone]) to discuss fax. Reports will be on lookout for it. States Jevity 1.5 remains on back order. Discussed alternative being Nutren 1.5 which is not listed as out of stock at this time. She states she will follow-up if there are any changes.   [name], MSW, [place] Office: [phone] Cell: [phone] Fax: [phone]

## 2022-01-25 NOTE — Progress Notes (Signed)
Speech Language Pathology Daily Session Note  Patient Details  Name: [name] MRN: [id number] Date of Birth: September 21, 1946  Today's Date: 01/25/2022 SLP Individual Time: 1410-1445 SLP Individual Time Calculation (min): 35 min  Short Term Goals: Week 5: SLP Short Term Goal 1 (Week 5): STGs=LTGs due to ELOS  Skilled Therapeutic Interventions: Patient received ~10 minutes late to ST session due to requiring medical attention from the PA. Patient handed off from OT. Patient upright in the wheelchair and with a pallor appearance. Patient's speech also appeared more dysarthric. Patient did not report feeling unwell despite questions from SLP. NT arrived to take vitals. BP: 82/49. Nursing made aware. Patient transferred back to bed with legs elevated. Patient's color began to return and his speech became more clear. Patient also reported he was feeling lightheaded in the wheelchair but denied upon questioning. SLP reinforced the importance of verbalizing when he is feeling poorly. Patient's wife present throughout and educated regarding importance of oral care at least 4 times a day, how to implement water via tsp and ideas to help alleviate stress around meals times. Ongoing education is needed and patient's wife will return tomorrow. Patient handed off to PT. Continue with current plan of care.      Pain No/Denies Pain   Therapy/Group: Individual Therapy  Lulu Hirschmann 01/25/2022, 3:53 PM

## 2022-01-25 NOTE — Progress Notes (Signed)
PROGRESS NOTE   Pt had a good night. Tells me he's ready to go home. Denies any new problems this morning. Wants to eat  ROS: Limited due to cognitive/behavioral    Objective:   No results found. Recent Labs    01/25/22 0534  WBC 5.8  HGB 11.2*  HCT 34.6*  PLT 315      Recent Labs    01/25/22 0534  NA 138  K 4.4  CL 104  CO2 27  GLUCOSE 111*  BUN 21  CREATININE 0.85  CALCIUM 8.7*        Intake/Output Summary (Last 24 hours) at 01/25/2022 1240 Last data filed at 01/24/2022 1839 Gross per 24 hour  Intake --  Output 400 ml  Net -400 ml              Physical Exam: Vital Signs Blood pressure (!) 134/111, pulse 81, temperature 98.3 F (36.8 C), temperature source Oral, resp. rate 18, height 5\' 10"  (1.778 m), weight 81.3 kg, SpO2 95 %. Constitutional: No distress . Vital signs reviewed. HEENT: NCAT, EOMI, oral membranes moist Neck: supple Cardiovascular: RRR without murmur. No JVD    Respiratory/Chest: CTA Bilaterally without wheezes or rales. Normal effort    GI/Abdomen: BS +, non-tender, non-distended, foley in for PEG Ext: no clubbing, cyanosis, or edema Psych: pleasant and cooperative  Skin: intact Neurologic: Cranial nerves II through XII intact, motor strength is 5/5 in bilateral deltoid, bicep, tricep, grip, hip flexor, knee extensors, ankle dorsiflexor and plantar flexor GU; urine remains red Skin: No evidence of breakdown, no evidence of rash.   Neuro:  .alert, oriented to person, [place], [place],   can be perseverative, follows basic commands. No resting tone. Sensory exam grossly intact.     Musculoskeletal: no jt pain   Assessment/Plan: 1. Functional deficits which require 3+ hours per day of interdisciplinary therapy in a comprehensive inpatient rehab setting. Physiatrist is providing close team supervision and 24 hour management of active medical problems listed  below. Physiatrist and rehab team continue to assess barriers to discharge/monitor patient progress toward functional and medical goals  Care Tool:  Bathing    Body parts bathed by patient: Right arm, Left arm, Right lower leg, Left lower leg, Face, Chest, Abdomen, Front perineal area, Buttocks, Right upper leg, Left upper leg   Body parts bathed by helper: Right arm, Left arm, Chest, Abdomen     Bathing assist Assist Level: Minimal Assistance - Patient > 75%     Upper Body Dressing/Undressing Upper body dressing   What is the patient wearing?: Pull over shirt    Upper body assist Assist Level: Supervision/Verbal cueing    Lower Body Dressing/Undressing Lower body dressing      What is the patient wearing?: Pants, Incontinence brief     Lower body assist Assist for lower body dressing: Moderate Assistance - Patient 50 - 74%     Toileting Toileting    Toileting assist Assist for toileting: Moderate Assistance - Patient 50 - 74%     Transfers Chair/bed transfer  Transfers assist     Chair/bed transfer assist level: Contact Guard/Touching assist Chair/bed transfer assistive device: Gilford Rile  Locomotion Ambulation   Ambulation assist   Ambulation activity did not occur: Safety/medical concerns  Assist level: Minimal Assistance - Patient > 75% Assistive device: Walker-rolling Max distance: >150 ft   Walk 10 feet activity   Assist  Walk 10 feet activity did not occur: Safety/medical concerns  Assist level: Minimal Assistance - Patient > 75% Assistive device: Walker-rolling   Walk 50 feet activity   Assist Walk 50 feet with 2 turns activity did not occur: Safety/medical concerns  Assist level: Minimal Assistance - Patient > 75% Assistive device: Walker-rolling    Walk 150 feet activity   Assist Walk 150 feet activity did not occur: Safety/medical concerns  Assist level: Minimal Assistance - Patient > 75% Assistive device: Walker-rolling     Walk 10 feet on uneven surface  activity   Assist Walk 10 feet on uneven surfaces activity did not occur: Safety/medical concerns         Wheelchair     Assist Is the patient using a wheelchair?: Yes Type of Wheelchair: Manual Wheelchair activity did not occur: Safety/medical concerns (limited by cognitive deficits)  Wheelchair assist level: Supervision/Verbal cueing Max wheelchair distance: 57 ft    Wheelchair 50 feet with 2 turns activity    Assist    Wheelchair 50 feet with 2 turns activity did not occur: Safety/medical concerns   Assist Level: Supervision/Verbal cueing   Wheelchair 150 feet activity     Assist  Wheelchair 150 feet activity did not occur: Safety/medical concerns       Blood pressure (!) 134/111, pulse 81, temperature 98.3 F (36.8 C), temperature source Oral, resp. rate 18, height 5\' 10"  (1.778 m), weight 81.3 kg, SpO2 95 %.    Medical Problem List and Plan: 1. 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  Status post posterior decompression with craniectomy 11/26/2021             -patient may not yet shower             -ELOS/Goals:   2/15.  supervision to min assist goals   -Continue CIR therapies including PT, OT, and SLP    2.  Antithrombotics: -DVT/anticoagulation:  Pharmaceutical: Other (comment) Eliquis             -antiplatelet therapy: N/A 3. Pain Management: Tizanidine no longer ordered, Voltaren gel 4 times daily, Fioricet as needed, oxycodone as needed 4. Mood/sleep:                 -antipsychotic agents: seroquel               - sleep chart    - continue seroquel 50 mg qam and 150mg  qhs  -continue HS celexa for sleep and mood stabilization  -rx'ed UTI 2/10 agitation much improved out of restraints  -continue telesiter only for now   -continue close observation for now 5. Neuropsych: This patient is not capable of making decisions on  his own behalf.  - telesitter ongoing-  6. Skin/Wound Care: Routine skin checks 7. Fluids/Electrolytes/Nutrition:   Continue foley for PEG as long as it continues to function. If he needs it long term will have balloon bumper feeding tube inserted  - on bolus feeds   - bolus schedule was adjusted, RD added further volume to TF  2/10 requested replacement of foley with appropriate feeding tube per IR 8.  Prolonged ventilatory support.  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     Decannulated  1/16- stoma closed 9.  Dysphagia.  Status post gastrostomy tube 12/13/2021 per Dr.Lovick-  -continue NPO, trials with SLP only. 10.  Aspiration pneumonia.  Antibiotic therapy completed.  -afebrile  -oob, oxygenation improved 11.  New onset atrial fibrillation.  Started Eliquis 12/17/2021.  Continue low-dose beta-blocker.    HR controlled  -appreciate cardiology consult. Discussed case with Dr. Gardiner Rhyme today.    2/10 continued hematuria   -low dose eliquis stopped 2/9   -discussed with cards yesterday  -can begin asa/plavix once urine clears up  -potential Watchman LA appendage closure device placement? Vitals:   01/25/22 0615 01/25/22 1047  BP: 123/86 (!) 134/111  Pulse: 100 81  Resp: 18   Temp: 98.3 F (36.8 C)   SpO2: 95%     12.  Hypertension.  Continue Norvasc 10 mg daily, Cozaar 50 mg daily, , clonidine 0.1 mg every 8 hours.  ,  2/3-2/4 BP controlled. Clonidine d/ced 2/3  2/10 don't believe this morning's bp is correct.  13.  Hyperlipidemia.  Lipitor 14.  History of BPH/urinary retention.        -generally has been voiding but is incontinent 15.  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  Follow-up per cardiology services. 16. 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 Has extensive hx per wife/son:     eliquis resumed at low dose 2/3 - 100k E coli UTI was rx'ed with keflex  -hgb improved to 11.7 2/6  -urology has seen, ?finasteride if can take  po  -2/9-10 recurrent hematuria---dc'd eliquis 2/9   -start plavix/asa tomorrow when urine clears   -  hgb sl down to 11.2 today--recheck Monday    LOS: 31 days A FACE TO FACE EVALUATION WAS PERFORMED  Meredith Staggers 01/25/2022, 12:40 PM

## 2022-01-25 NOTE — Progress Notes (Signed)
Occupational Therapy Session Note  Patient Details  Name: [name] MRN: [id number] Date of Birth: 06/30/1946  Today's Date: 01/25/2022 OT Individual Time: 1002-1055 OT Individual Time Calculation (min): 53 min    Short Term Goals: Week 4:  OT Short Term Goal 1 (Week 4): STG = LTG d/t ELOS OT Short Term Goal 1 - Progress (Week 4): Progressing toward goal Week 5:  OT Short Term Goal 1 (Week 5): Continue progressing toward established LTG  Skilled Therapeutic Interventions/Progress Updates:  Patient met lying supine in bed reporting need to void bladder urgently. This Probation officer assisted NT with sit to stand and functional mobility to commode in bathroom secondary to ataxia, poor safety awareness and motor planning deficits. 0/10 pain reported at rest and with activity. Abdominal binder donned but TED hose off at start of session 2/2 urgent transfer to commode. Patient required cues for safety with hygiene/clothing management with patient attempting to stand from commode several times before this writer instructed him to do so. Education provided on importance of following commands. Patient expressed verbal understanding. Sit to stand from standard height commode with Max cues for hand placement and walker management. Patient then completed walk-in shower transfer to bari [facility] with Min-Mod A and heavy cues for safety/hand placement. Tegaderm film applied to abdomen over PEG. UB/LB bathing/dressing with cues for sequencing initiation and sequencing Min to Mod A. Max A to don TED hose and abdominal binder. With sit to stand from bari [facility] to hike pants over hips, patient with quick fluttering of both eyes at which time patient leaded into shower wall on L hitting head x2. Max A for controlled descent onto [facility] and RN called to room. Patient then reported onset of nausea expelling small volume of emesis in bucket provided. Vitals assessed with BP 76/54. Cool washcloth applied to posterior neck. Stand-pivot to  wc >EOB with Min A. Noted difficulty with simple conversation as patient demonstrated word salad and tangential speech responding to questions with words unrelated to topic of conversation. RN reports this is abnormal. Once supine, BP 100/70. Knees of bed elevated and HOB remained flat for BP control. Session concluded with patient lying supine in bed with RN and NT present at bedside, call bell within reach, bed alarm activated and all needs met.   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 General:    Therapy/Group: Individual Therapy  Jacki Couse R [place] 01/25/2022, 11:19 AM

## 2022-01-25 NOTE — Progress Notes (Signed)
Occupational Therapy Session Note  Patient Details  Name: [name] MRN: [id number] Date of Birth: June 02, 1946  Today's Date: 01/25/2022 OT Individual Time: 1300-1345 OT Individual Time Calculation (min): 45 min  and Today's Date: 01/25/2022 OT Missed Time: 15 Minutes Missed Time Reason: Nursing care   Short Term Goals: Week 5:  OT Short Term Goal 1 (Week 5): Continue progressing toward established LTG  Skilled Therapeutic Interventions/Progress Updates:    Pt received supine with no c/o pain or dizziness. Family education session completed with pt and his wife [name]. His brother in law Timmothy Sours present for initial education only. Verbal education provided re fall risk reduction, energy conservation strategies, home carryover of transfer training, ADLs, and IADLs. Pt completed bed mobility with (S) to EOB and then a stand pivot transfer with min A to the w/c. Extensive discussion re home equipment, safety, visitors, appropriate day to day activities, and supervision needed. Also discussed stroke risk and need for all people supervising to know BE FAST acronym. Pt was taken via w/c to the ADL apt. Demonstration and hands on training completed for pt performance of functional mobility and furniture transfers. Terri about to provide hands on min A for transfer to real bed. While attempting to get from supine to EOB pt misjudged distance to edge and rolled off bed and his head square on the nightstand. He was assisted back to supine and PA was notified who entered immediately. First aid provided by PA to bleeding R eyebrow laceration and R hand skin tear. Discussed with Terri ways to prevent this in the future at home, including a bedrail and anticipating need to provide more like min A anteriorly despite pt ability to complete bed mobility with (S) usually. 15 min missed d/t PA care. Pt transferred back to his w/c and was passed off to SLP in hallway.    Therapy Documentation Precautions:   Precautions Precautions: Fall, Other (comment) Precaution Comments: PEG, B wrist restraints, trach removed on 1/16, truncal ataxia Restrictions Weight Bearing Restrictions: No Other Position/Activity Restrictions: Wears a lift in R shoe s/p hip replacement on L causing leg length discrepency   Therapy/Group: Individual Therapy  [name] 01/25/2022, 6:39 AM

## 2022-01-25 NOTE — Progress Notes (Signed)
Physical Therapy Session Note  Patient Details  Name: [name] MRN: [id number] Date of Birth: 03-Oct-1946  Today's Date: 01/25/2022 PT Individual Time: 5784-6962 PT Individual Time Calculation (min): 43 min  and Today's Date: 01/25/2022 PT Missed Time: 17 Minutes Missed Time Reason: Other (Comment) (Hypotension)   Skilled Therapeutic Interventions/Progress Updates:     Therapy initially deferred due to pt hypotension and reportedly falling shortly before session. PT returns 15 minutes later and pt still in trendelenburg position in bed. NT takes BP at 88/60. Pt appears slightly "woozy" so OOB mobility deferred at this time. No complaint of pain. Session instead focused on family education with wife. Pt's wife, [name], has many questions for therapist regarding pt's mobility and strategies for interaction with pt to achieve optimal outcomes. PT has extensive discussion with wife regarding safe approaches to interactions with pt as well as education on safe guarding of pt for mobility. During discussion, pt impulsively sits up in bed and requests to use restroom. Sit to stand with CGA and minA for ambulation to toilet with RW. Pt continent of urine and impulsively stands to RW, requiring minA to stabilize prior to ambulating back to bed. Sit to supine with cues for positioning. PT continues to have discussion with wife and provides suggestions for chair and bed alarms. PT arranges for additional family education with PT and OT prior to DC. Pt left supine in bd with alarm intact and all needs within reach.  Pt misses 17 minutes of skilled PT due hypotension and RN deferral.  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 causing leg length  discrepency   Therapy/Group: Individual Therapy  Breck Coons. PT, DPT 01/25/2022, 3:50 PM

## 2022-01-26 ENCOUNTER — Inpatient Hospital Stay (HOSPITAL_COMMUNITY): Payer: Medicare Other

## 2022-01-26 IMAGING — CT CT HEAD W/O CM
4 series · 16 of 47 positions shown, 18 images · non-contrast
Comparison: Head CT dated [DATE].

CLINICAL DATA: Stroke follow-up.



[Series 3: head wo · axial · 0.41mm/px · z∈[-101,+19]mm · 7 of 32 slices shown, 9 images]
[im 4/32  brain]
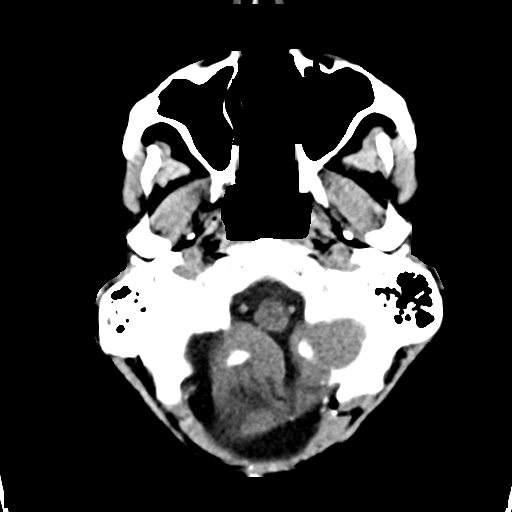
[im 4/32  bone]
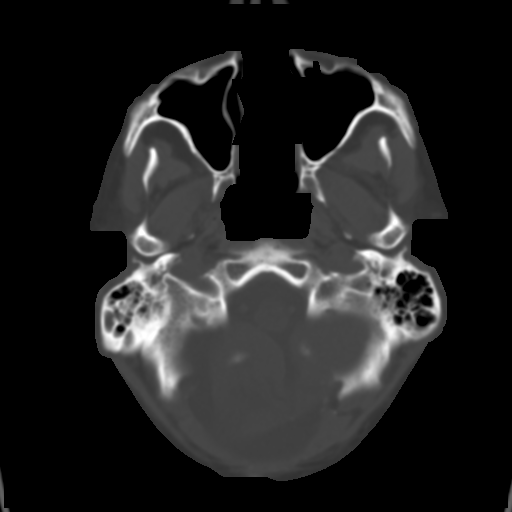
[im 8/32  brain]
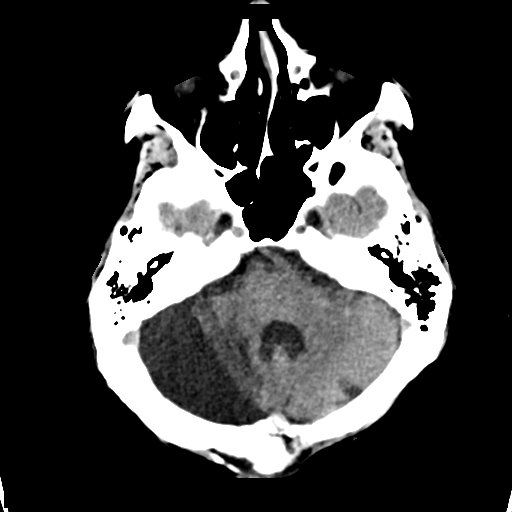
[im 12/32  brain]
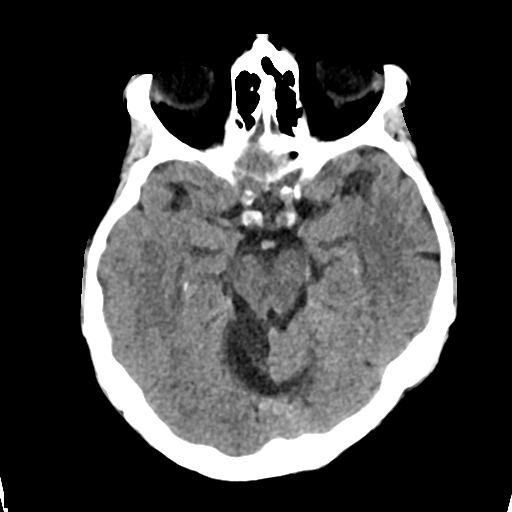
[im 16/32  brain]
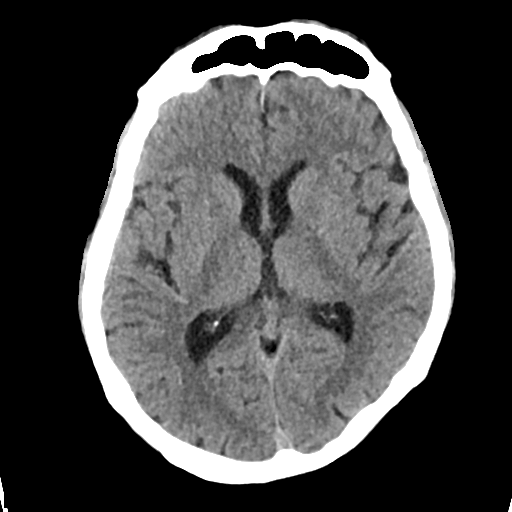
[im 20/32  brain]
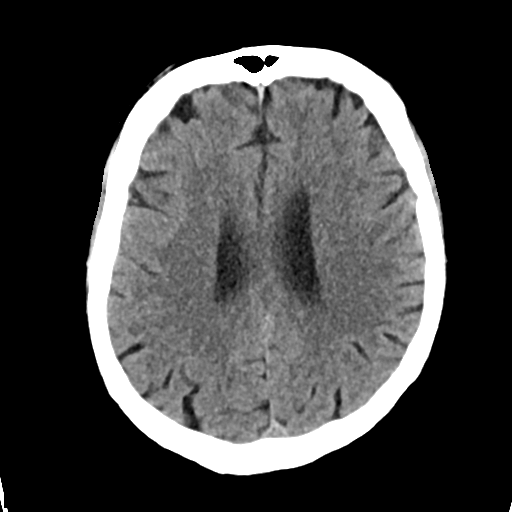
[im 20/32  bone]
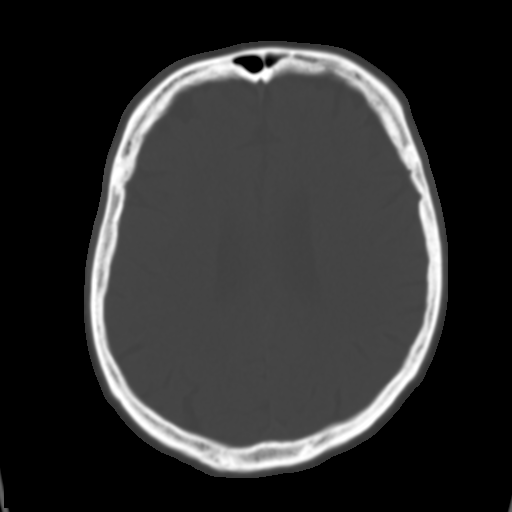
[im 24/32  brain]
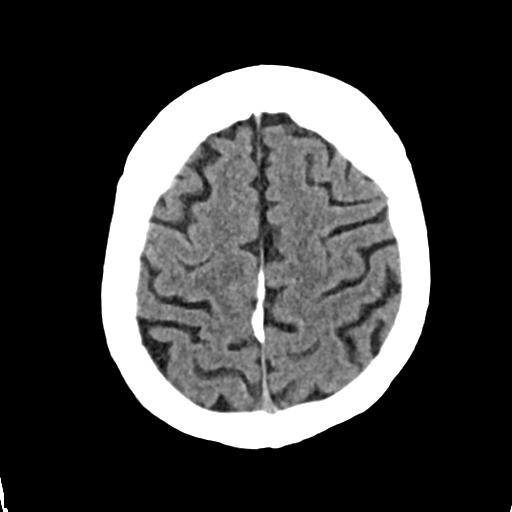
[im 28/32  brain]
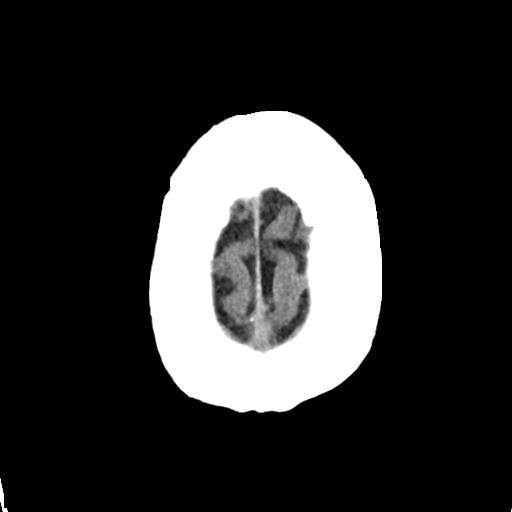

[Series 4: head bone · axial · 0.41mm/px · z∈[-102,-70]mm · 3 of 80 slices shown]
[im 8/80  bone]
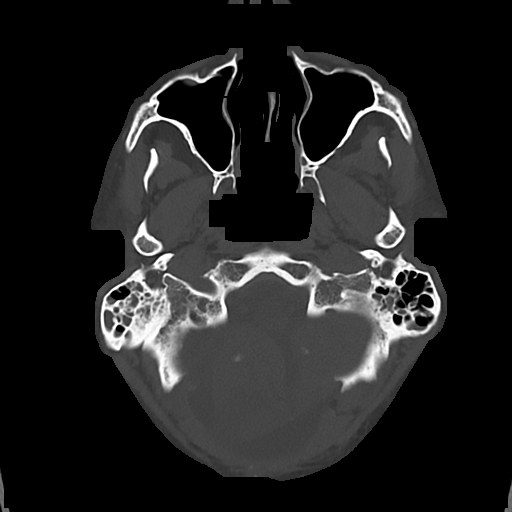
[im 16/80  bone]
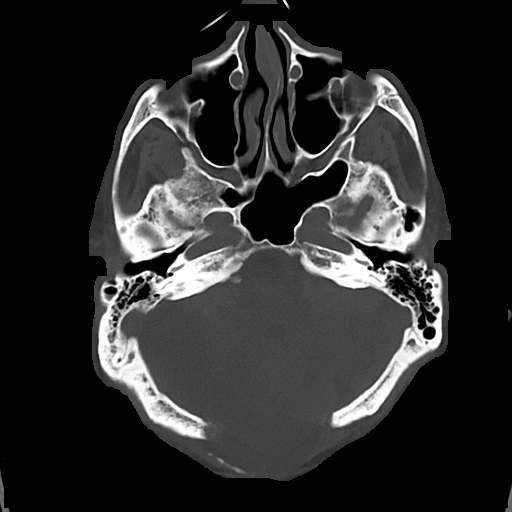
[im 24/80  bone]
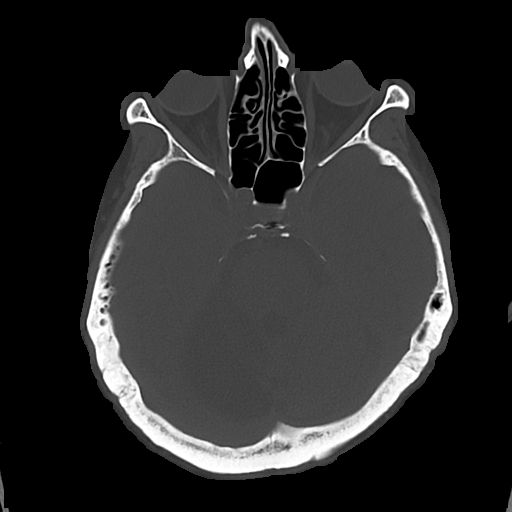

[Series 5: cor soft · coronal · 0.33mm/px · 3 of 71 slices shown]
[im 24/71  brain]
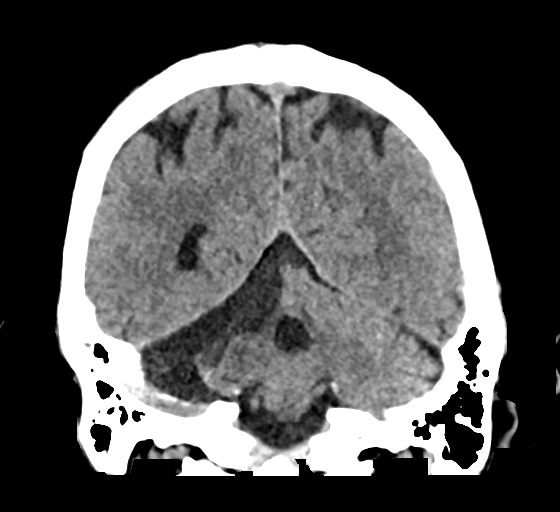
[im 32/71  brain]
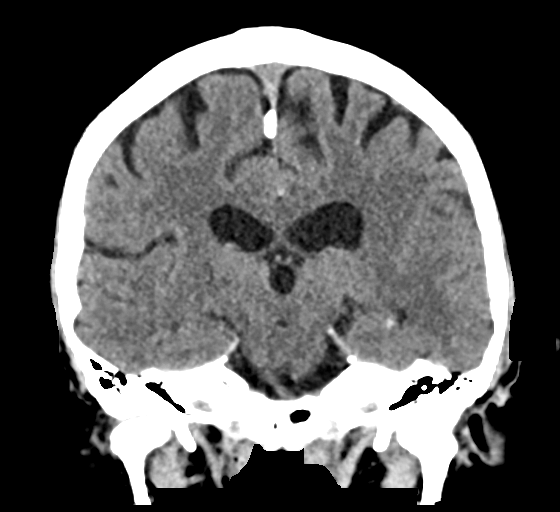
[im 39/71  brain]
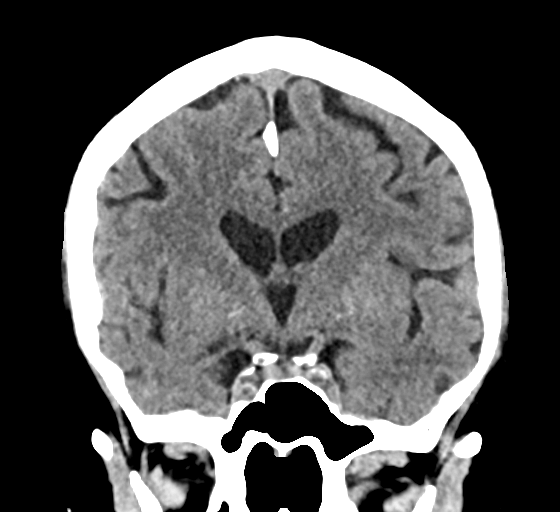

[Series 6: sag soft · sagittal · 0.33mm/px · 3 of 62 slices shown]
[im 21/62  brain]
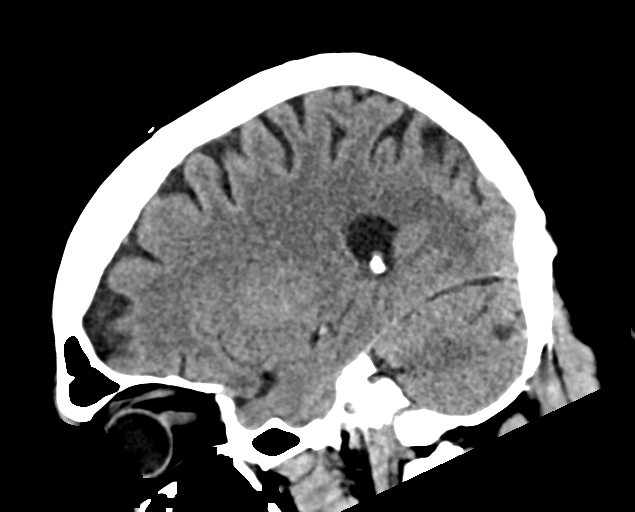
[im 31/62  brain]
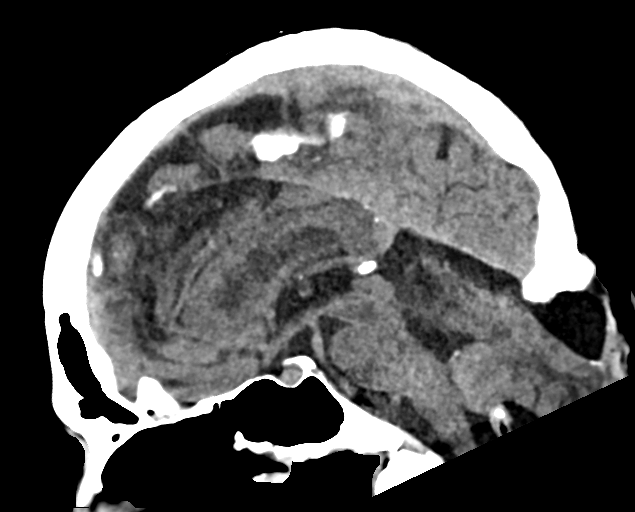
[im 41/62  brain]
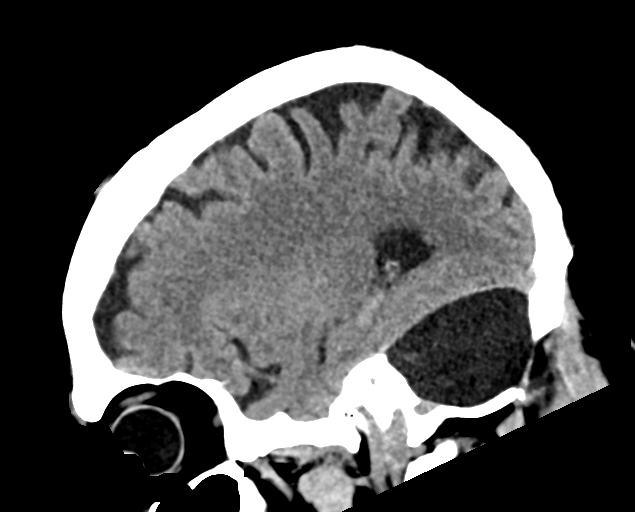

[16 of 47 positions shown; findings below may reference images not displayed]

FINDINGS: Brain: Mild age-related atrophy and chronic microvascular ischemic
changes. Right cerebellar infarct as seen on the prior CTs. There is
no acute intracranial hemorrhage. No mass effect or midline shift.
No extra-axial fluid collection.

Vascular: No hyperdense vessel or unexpected calcification.

Skull: No acute calvarial pathology. Suboccipital craniectomy with
herniation of the cerebellum similar to prior CT.

Sinuses/Orbits: The visualized paranasal sinuses are clear. Partial
opacification of the mastoid air cells bilaterally.

Other: None
IMPRESSION: 1. No acute intracranial hemorrhage.
2. Mild age-related atrophy and chronic microvascular ischemic
changes. Right cerebellar infarct, seen previously.
3. Suboccipital craniectomy with herniation of the right cerebellum
similar to prior CT.

## 2022-01-26 IMAGING — CT CT HEAD W/O CM
4 series · 16 of 47 positions shown, 18 images · non-contrast
Comparison: Head CT from earlier the same day

CLINICAL DATA: Head trauma with severe headache



[Series 3: head without · axial · non-contrast · 0.46mm/px · z∈[-11,+109]mm · 7 of 33 slices shown, 9 images]
[im 5/33  brain]
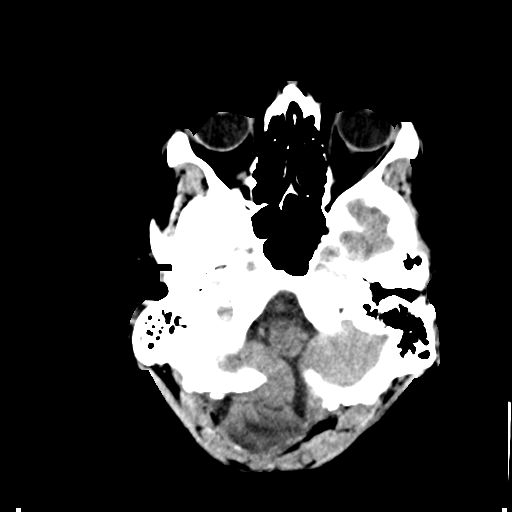
[im 5/33  bone]
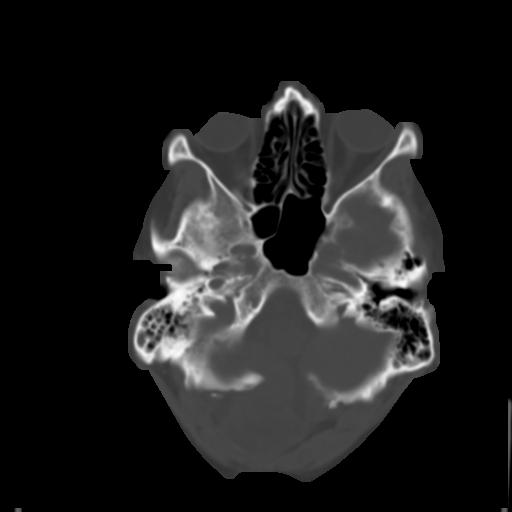
[im 9/33  brain]
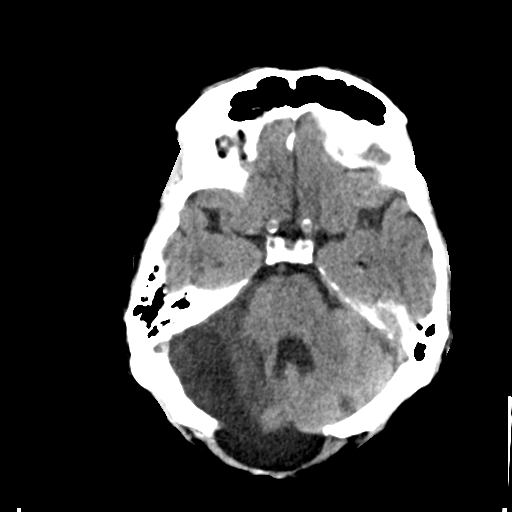
[im 13/33  brain]
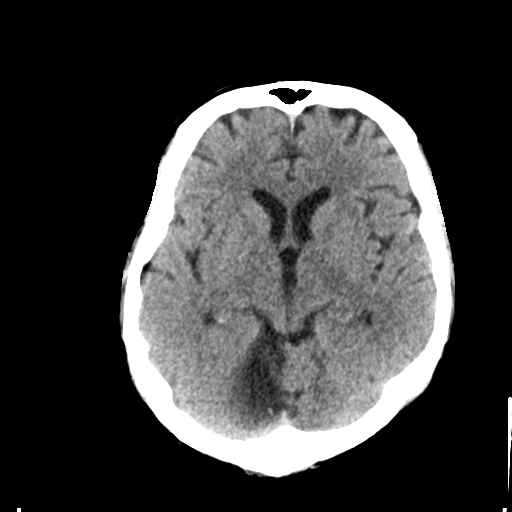
[im 17/33  brain]
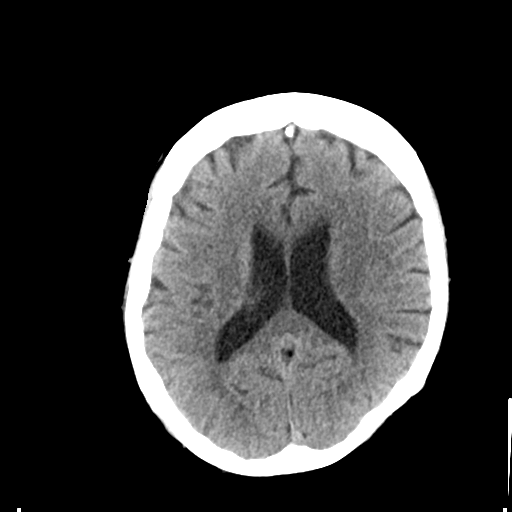
[im 21/33  brain]
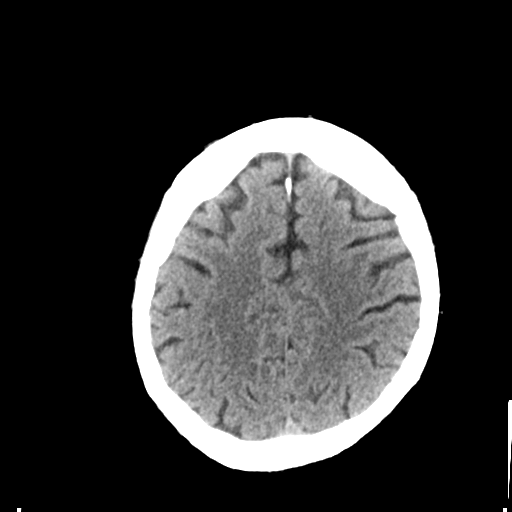
[im 21/33  bone]
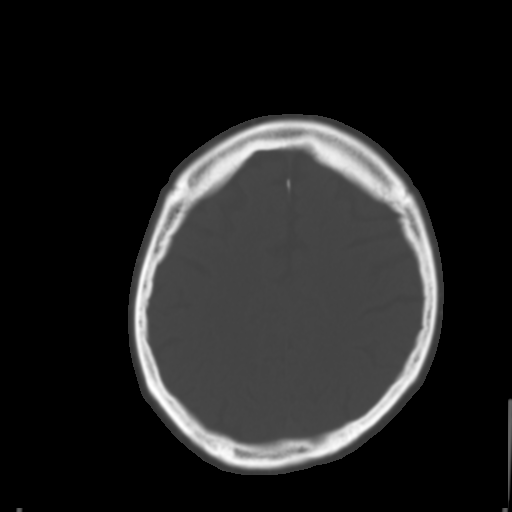
[im 25/33  brain]
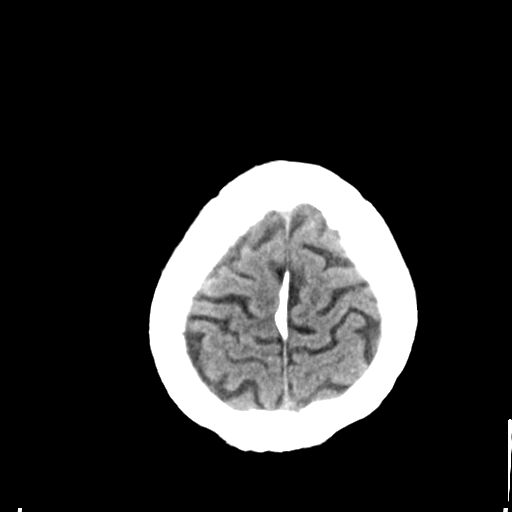
[im 29/33  brain]
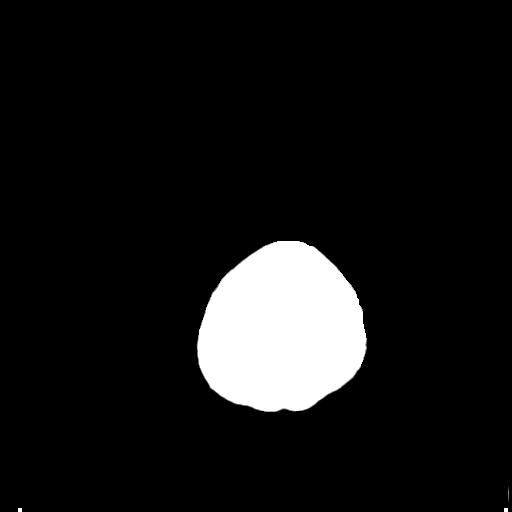

[Series 4: ax head bone · axial · 0.41mm/px · z∈[-11,+20]mm · 3 of 80 slices shown]
[im 8/80  bone]
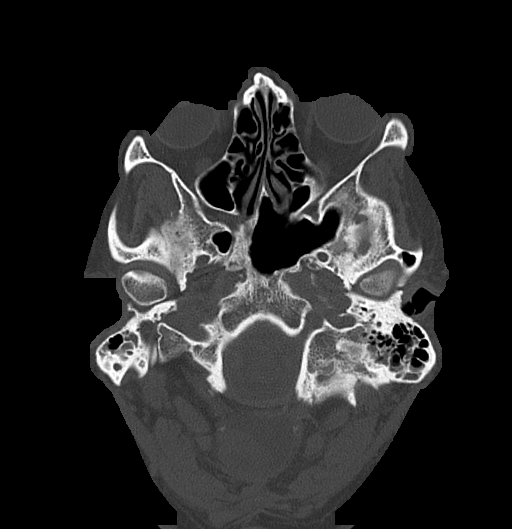
[im 16/80  bone]
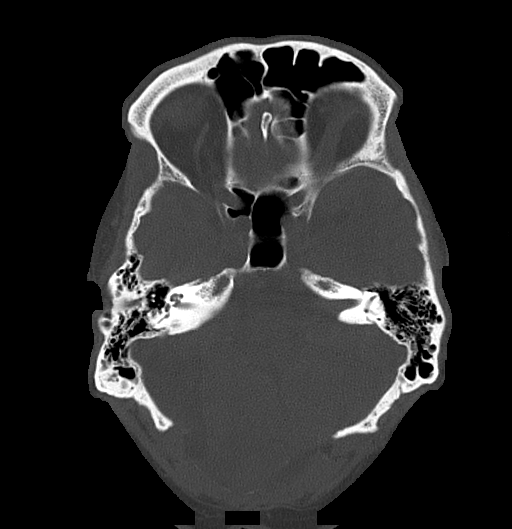
[im 24/80  bone]
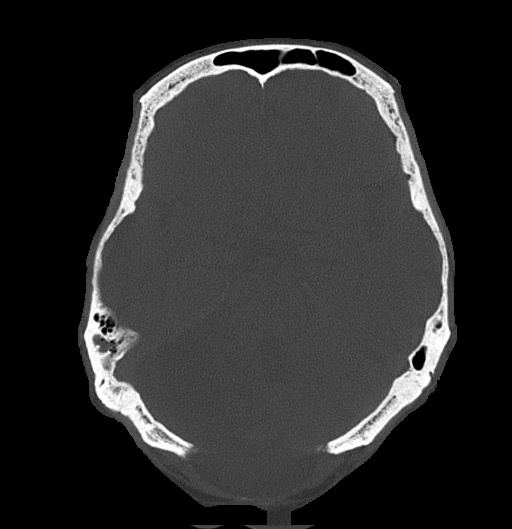

[Series 5: head without cor · coronal · non-contrast · 0.32mm/px · 3 of 62 slices shown]
[im 21/62  brain]
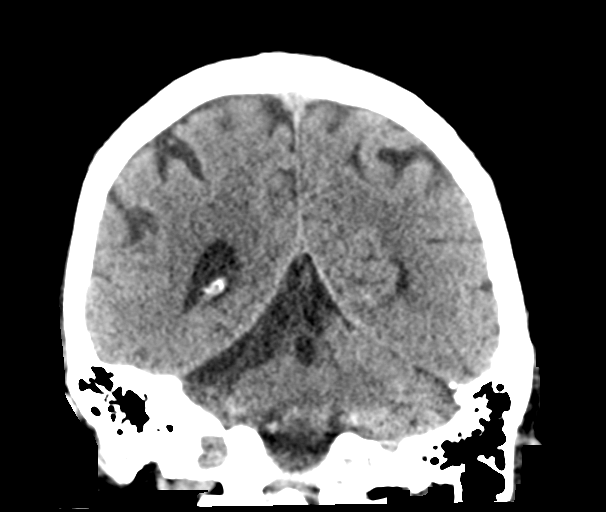
[im 28/62  brain]
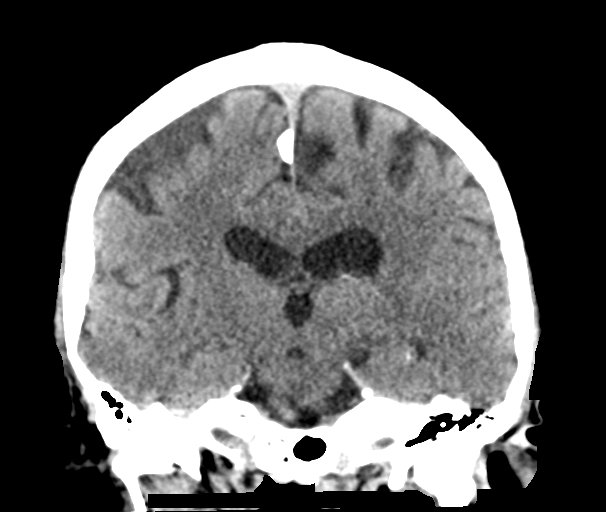
[im 34/62  brain]
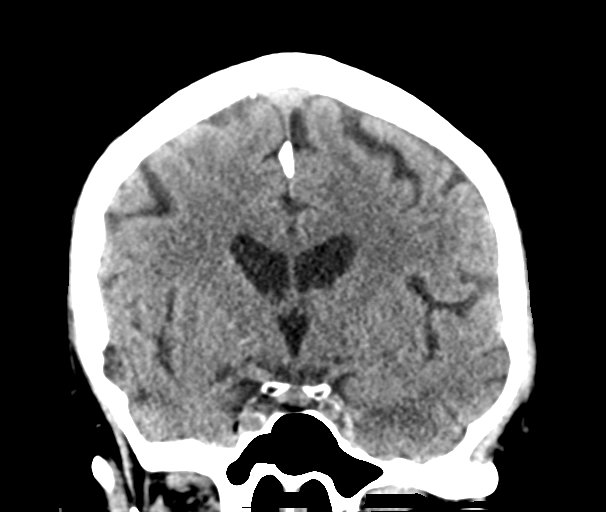

[Series 6: head without sag · sagittal · non-contrast · 0.32mm/px · 3 of 54 slices shown]
[im 18/54  brain]
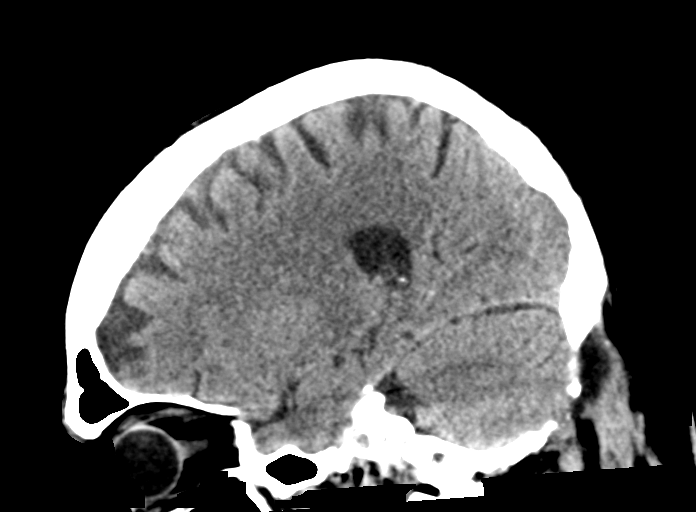
[im 27/54  brain]
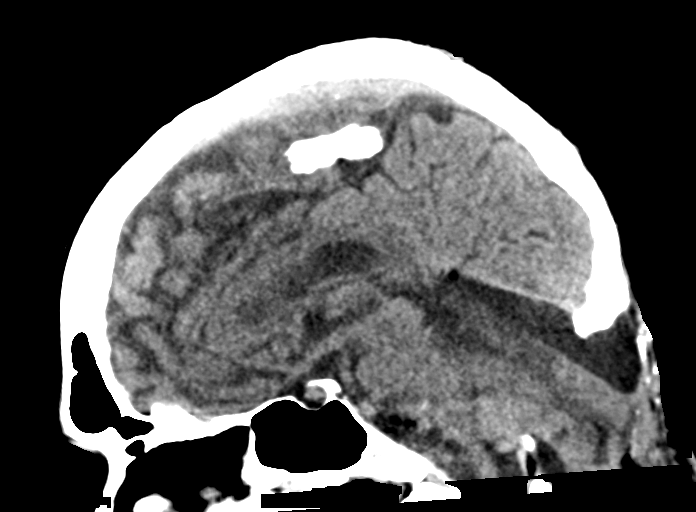
[im 36/54  brain]
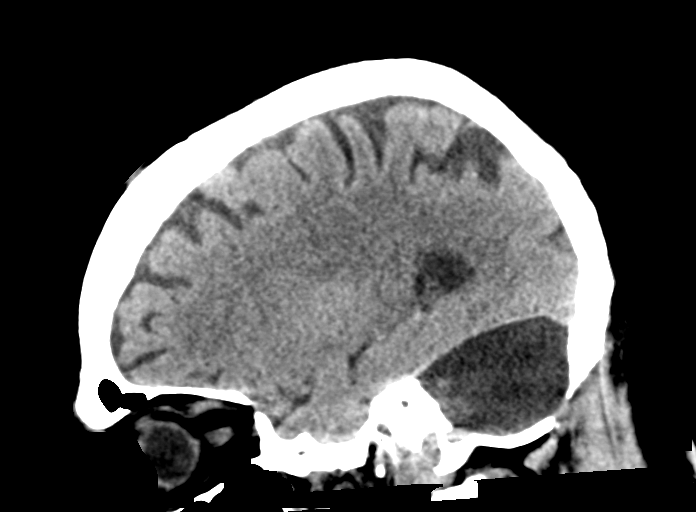

[16 of 47 positions shown; findings below may reference images not displayed]

FINDINGS: Brain: Prior suboccipital craniectomy with right cerebellar
encephalomalacia and lateral cerebellar CSF collection. Bulging of
the meninges and gliotic right cerebellum through the craniectomy,
unchanged. No hydrocephalus, acute infarct, mass, or blood products.

Vascular: No hyperdense vessel or unexpected calcification.

Skull: Suboccipital craniectomy as described

Sinuses/Orbits: Negative
IMPRESSION: 1. No acute finding or change from earlier today.
2. Suboccipital craniectomy with chronic hygroma, right cerebellar
gliosis, and cephalocele.

## 2022-01-26 MED ORDER — TRAZODONE HCL 50 MG PO TABS
75.0000 mg | ORAL_TABLET | Freq: Every day | ORAL | Status: DC
  Administered 2022-01-26 – 2022-02-07 (×13): 75 mg
  Filled 2022-01-26 (×13): qty 2

## 2022-01-26 MED ORDER — FREE WATER
250.0000 mL | Status: DC
  Administered 2022-01-26 – 2022-02-08 (×75): 250 mL

## 2022-01-26 NOTE — Progress Notes (Signed)
PROGRESS NOTE     Pt initially doing OK- denies any issues- ready to go home.   LBM today.  Then nurse called and let me know that fell last night when got OOB- no injury however was c/o severe HA- so we got head CT- was Negative for bleeding or any acute pathology.   Will order posey belt since got OOB 3x overnight.  Also BP low 78/48- pt denies dizziness/lightheaded.     ROS: limited due to cognition   Objective:   CT HEAD WO CONTRAST (5MM)  Result Date: 01/26/2022 CLINICAL DATA:  Head trauma with severe headache EXAM: CT HEAD WITHOUT CONTRAST TECHNIQUE: Contiguous axial images were obtained from the base of the skull through the vertex without intravenous contrast. RADIATION DOSE REDUCTION: This exam was performed according to the departmental dose-optimization program which includes automated exposure control, adjustment of the mA and/or kV according to patient size and/or use of iterative reconstruction technique. COMPARISON:  Head CT from earlier the same day FINDINGS: Brain: Prior suboccipital craniectomy with right cerebellar encephalomalacia and lateral cerebellar CSF collection. Bulging of the meninges and gliotic right cerebellum through the craniectomy, unchanged. No hydrocephalus, acute infarct, mass, or blood products. Vascular: No hyperdense vessel or unexpected calcification. Skull: Suboccipital craniectomy as described Sinuses/Orbits: Negative IMPRESSION: 1. No acute finding or change from earlier today. 2. Suboccipital craniectomy with chronic hygroma, right cerebellar gliosis, and cephalocele. Electronically Signed   By: Jorje Guild M.D.   On: 01/26/2022 09:57   CT HEAD WO CONTRAST (5MM)  Result Date: 01/26/2022 CLINICAL DATA:  Stroke follow-up. EXAM: CT HEAD WITHOUT CONTRAST TECHNIQUE: Contiguous axial images were obtained from the base of the skull through the vertex without intravenous contrast. RADIATION  DOSE REDUCTION: This exam was performed according to the departmental dose-optimization program which includes automated exposure control, adjustment of the mA and/or kV according to patient size and/or use of iterative reconstruction technique. COMPARISON:  Head CT dated 01/02/2022. FINDINGS: Brain: Mild age-related atrophy and chronic microvascular ischemic changes. Right cerebellar infarct as seen on the prior CTs. There is no acute intracranial hemorrhage. No mass effect or midline shift. No extra-axial fluid collection. Vascular: No hyperdense vessel or unexpected calcification. Skull: No acute calvarial pathology. Suboccipital craniectomy with herniation of the cerebellum similar to prior CT. Sinuses/Orbits: The visualized paranasal sinuses are clear. Partial opacification of the mastoid air cells bilaterally. Other: None IMPRESSION: 1. No acute intracranial hemorrhage. 2. Mild age-related atrophy and chronic microvascular ischemic changes. Right cerebellar infarct, seen previously. 3. Suboccipital craniectomy with herniation of the right cerebellum similar to prior CT. Electronically Signed   By: Anner Crete M.D.   On: 01/26/2022 01:45   Recent Labs    01/25/22 0534  WBC 5.8  HGB 11.2*  HCT 34.6*  PLT 315      Recent Labs    01/25/22 0534  NA 138  K 4.4  CL 104  CO2 27  GLUCOSE 111*  BUN 21  CREATININE 0.85  CALCIUM 8.7*       No intake or output data in the 24 hours ending 01/26/22 1323             Physical  Exam: Vital Signs Blood pressure 112/65, pulse 78, temperature 98 F (36.7 C), resp. rate 14, height 5\' 10"  (1.778 m), weight 81 kg, SpO2 100 %.    General: awake, Ox1; sitting up in w/c; NAD HENT: conjugate gaze; oropharynx moist CV: regular rate; no JVD Pulmonary: CTA B/L; no W/R/R- good air movement GI: soft, NT, ND, (+)BS- PEG in place- is NPO Psychiatric: vague- flat; wants to leave Neurological: Ox1- vague Skin: intact Neurologic: Cranial  nerves II through XII intact, motor strength is 5/5 in bilateral deltoid, bicep, tricep, grip, hip flexor, knee extensors, ankle dorsiflexor and plantar flexor GU; urine remains red- light red Skin: No evidence of breakdown, no evidence of rash.   Neuro:  .alert, oriented to person, [place], [place],   can be perseverative, follows basic commands. No resting tone. Sensory exam grossly intact.     Musculoskeletal: no jt pain   Assessment/Plan: 1. Functional deficits which require 3+ hours per day of interdisciplinary therapy in a comprehensive inpatient rehab setting. Physiatrist is providing close team supervision and 24 hour management of active medical problems listed below. Physiatrist and rehab team continue to assess barriers to discharge/monitor patient progress toward functional and medical goals  Care Tool:  Bathing    Body parts bathed by patient: Right arm, Left arm, Right lower leg, Left lower leg, Face, Chest, Abdomen, Front perineal area, Buttocks, Right upper leg, Left upper leg   Body parts bathed by helper: Right arm, Left arm, Chest, Abdomen     Bathing assist Assist Level: Minimal Assistance - Patient > 75%     Upper Body Dressing/Undressing Upper body dressing   What is the patient wearing?: Pull over shirt    Upper body assist Assist Level: Supervision/Verbal cueing    Lower Body Dressing/Undressing Lower body dressing      What is the patient wearing?: Pants, Incontinence brief     Lower body assist Assist for lower body dressing: Moderate Assistance - Patient 50 - 74%     Toileting Toileting    Toileting assist Assist for toileting: Moderate Assistance - Patient 50 - 74%     Transfers Chair/bed transfer  Transfers assist     Chair/bed transfer assist level: Contact Guard/Touching assist Chair/bed transfer assistive device: Programmer, multimedia   Ambulation assist   Ambulation activity did not occur: Safety/medical  concerns  Assist level: Minimal Assistance - Patient > 75% Assistive device: Walker-rolling Max distance: >150 ft   Walk 10 feet activity   Assist  Walk 10 feet activity did not occur: Safety/medical concerns  Assist level: Minimal Assistance - Patient > 75% Assistive device: Walker-rolling   Walk 50 feet activity   Assist Walk 50 feet with 2 turns activity did not occur: Safety/medical concerns  Assist level: Minimal Assistance - Patient > 75% Assistive device: Walker-rolling    Walk 150 feet activity   Assist Walk 150 feet activity did not occur: Safety/medical concerns  Assist level: Minimal Assistance - Patient > 75% Assistive device: Walker-rolling    Walk 10 feet on uneven surface  activity   Assist Walk 10 feet on uneven surfaces activity did not occur: Safety/medical concerns         Wheelchair     Assist Is the patient using a wheelchair?: Yes Type of Wheelchair: Manual Wheelchair activity did not occur: Safety/medical concerns (limited by cognitive deficits)  Wheelchair assist level: Supervision/Verbal cueing Max wheelchair distance: 57 ft    Wheelchair 50 feet with 2 turns activity  Assist    Wheelchair 50 feet with 2 turns activity did not occur: Safety/medical concerns   Assist Level: Supervision/Verbal cueing   Wheelchair 150 feet activity     Assist  Wheelchair 150 feet activity did not occur: Safety/medical concerns       Blood pressure 112/65, pulse 78, temperature 98 F (36.7 C), resp. rate 14, height 5\' 10"  (1.778 m), weight 81 kg, SpO2 100 %.    Medical Problem List and Plan: 1. 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  Status post posterior decompression with craniectomy 11/26/2021             -patient may not yet shower             -ELOS/Goals:   2/15.  supervision to min assist goals   Continue CIR- PT, OT and SLP- will  order posey belt- since pt getting OOB; if doesn't work, might need to upgrade, since fell x2 yesterday   2.  Antithrombotics: -DVT/anticoagulation:  Pharmaceutical: Other (comment) Eliquis             -antiplatelet therapy: N/A 3. Pain Management: Tizanidine no longer ordered, Voltaren gel 4 times daily, Fioricet as needed, oxycodone as needed 4. Mood/sleep:                 -antipsychotic agents: seroquel               - sleep chart    - continue seroquel 50 mg qam and 150mg  qhs  -continue HS celexa for sleep and mood stabilization  -rx'ed UTI 2/10 agitation much improved out of restraints  -continue telesiter only for now   -continue close observation for now 5. Neuropsych: This patient is not capable of making decisions on his own behalf.  - telesitter ongoing-  6. Skin/Wound Care: Routine skin checks 7. Fluids/Electrolytes/Nutrition:   Continue foley for PEG as long as it continues to function. If he needs it long term will have balloon bumper feeding tube inserted  - on bolus feeds   - bolus schedule was adjusted, RD added further volume to TF  2/10 requested replacement of foley with appropriate feeding tube per IR 8.  Prolonged ventilatory support.  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     Decannulated 1/16- stoma closed 9.  Dysphagia.  Status post gastrostomy tube 12/13/2021 per Dr.Lovick-  -continue NPO, trials with SLP only. 10.  Aspiration pneumonia.  Antibiotic therapy completed.  -afebrile  -oob, oxygenation improved 11.  New onset atrial fibrillation.  Started Eliquis 12/17/2021.  Continue low-dose beta-blocker.    HR controlled  -appreciate cardiology consult. Discussed case with Dr. Gardiner Rhyme today.    2/10 continued hematuria   -low dose eliquis stopped 2/9   -discussed with cards yesterday  -can begin asa/plavix once urine clears up  -potential Watchman LA appendage closure device placement? Vitals:   01/25/22 2235 01/26/22 0558  BP: 120/65 112/65   Pulse: 96 78  Resp: 18 14  Temp:  98 F (36.7 C)  SpO2: 95% 100%    12.  Hypertension.  Continue Norvasc 10 mg daily, Cozaar 50 mg daily, , clonidine 0.1 mg every 8 hours.  ,  2/3-2/4 BP controlled. Clonidine d/ced 2/3  2/10 don't believe this morning's bp is correct.   2/11- BP is 70s/40s again this AM- will hold Norvasc and monitor- also think pt is dry 13.  Hyperlipidemia.  Lipitor 14.  History of BPH/urinary retention.        -  generally has been voiding but is incontinent 15.  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  Follow-up per cardiology services. 16. 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 Has extensive hx per wife/son:     eliquis resumed at low dose 2/3 - 100k E coli UTI was rx'ed with keflex  -hgb improved to 11.7 2/6  -urology has seen, ?finasteride if can take po  -2/9-10 recurrent hematuria---dc'd eliquis 2/9   -start plavix/asa tomorrow when urine clears   -  hgb sl down to 11.2 today--recheck Monday 17. Dry/Azotemia/low BP  2/11- will see if can increase water flushes for pt-  18. Fall  2/11- CT scan was (-)- put on posey belt and telesitter- will monitor closely- no change in mentation.   I spent a total of 45    minutes on total care today- >50% coordination of care- due to d/w nursing 5 different times and ordering CT, etc.    LOS: 32 days A FACE TO FACE EVALUATION WAS PERFORMED  Ammara Raj 01/26/2022, 1:23 PM

## 2022-01-26 NOTE — Progress Notes (Signed)
Speech Language Pathology Daily Session Note  Patient Details  Name: [name] MRN: [id number] Date of Birth: 09-24-1946  Today's Date: 01/26/2022 SLP Individual Time: 4696-2952 SLP Individual Time Calculation (min): 45 min  Short Term Goals: Week 5: SLP Short Term Goal 1 (Week 5): STGs=LTGs due to ELOS  Skilled Therapeutic Interventions: Pt seen for skilled ST with focus on swallow and cognitive goals, wife present for educational tx session. Pt sitting upright in wheelchair and completing oral care via suction toothbrush with Supervision A cues for thoroughness. Pt's wife providing ~10 1/2 tsp sized boluses of thin water with occasional education on proper amount of liquid. Pt with 1 overt s/s aspiration (large coughing episode) following attempting to talk during swallow and absent second swallow despite verbal cues. Pt cognitive impairments continue to impact use of swallow strategies despite cues provided. SLP facilitating simple conversation with focus on sustained attention and topic maintenance. Pt wife expressing concern with his change in cognitive function and increased confusion (2 falls yesterday), appropriate worries with upcoming discharge home. At this time, pt color began to fade and when asked reported headache and dizziness. RN brought to room to assess patient, BP quite low and administering meds via G-tube. Session ended with nursing in room to assist pt back to bed, CT pending. Cont ST POC.   Pain Pain Assessment Pain Scale: 0-10 Pain Score: 0-No pain  Therapy/Group: Individual Therapy  [name] 01/26/2022, 10:43 AM

## 2022-01-26 NOTE — Progress Notes (Signed)
NUTRITION NOTE  Page received from RN that patient is experiencing hypotension and request made for increase in free water boluses.  Patient is currently receiving 200 ml free water QID (800 ml free water/day). Will change order to 250 ml x6/day (1500 ml/day).  Full follow-up assessment to occur 2/12.      Jarome Matin, MS, RD, LDN Inpatient Clinical Dietitian RD pager # available in [place]  After hours/weekend pager # available in [facility]

## 2022-01-27 NOTE — Progress Notes (Signed)
Patient remained restless this AM. Refused Voltaren rub, however states he had pain. PRN OXY given per order, upon recheck effective. Patient brought up behind nurse desk for safety this AM. Patient watching football on the computer which seems to decrease agitation.

## 2022-01-27 NOTE — Progress Notes (Signed)
PROGRESS NOTE     Pt confused- only thing he said that made any sense was going to watch "game"- but insists is leaving "here" to go to "the game"- didn't know was superbowl today.    YYQ:MGNOIBB due to cognition   Objective:   CT HEAD WO CONTRAST (5MM)  Result Date: 01/26/2022 CLINICAL DATA:  Head trauma with severe headache EXAM: CT HEAD WITHOUT CONTRAST TECHNIQUE: Contiguous axial images were obtained from the base of the skull through the vertex without intravenous contrast. RADIATION DOSE REDUCTION: This exam was performed according to the departmental dose-optimization program which includes automated exposure control, adjustment of the mA and/or kV according to patient size and/or use of iterative reconstruction technique. COMPARISON:  Head CT from earlier the same day FINDINGS: Brain: Prior suboccipital craniectomy with right cerebellar encephalomalacia and lateral cerebellar CSF collection. Bulging of the meninges and gliotic right cerebellum through the craniectomy, unchanged. No hydrocephalus, acute infarct, mass, or blood products. Vascular: No hyperdense vessel or unexpected calcification. Skull: Suboccipital craniectomy as described Sinuses/Orbits: Negative IMPRESSION: 1. No acute finding or change from earlier today. 2. Suboccipital craniectomy with chronic hygroma, right cerebellar gliosis, and cephalocele. Electronically Signed   By: Jorje Guild M.D.   On: 01/26/2022 09:57   CT HEAD WO CONTRAST (5MM)  Result Date: 01/26/2022 CLINICAL DATA:  Stroke follow-up. EXAM: CT HEAD WITHOUT CONTRAST TECHNIQUE: Contiguous axial images were obtained from the base of the skull through the vertex without intravenous contrast. RADIATION DOSE REDUCTION: This exam was performed according to the departmental dose-optimization program which includes automated exposure control, adjustment of the mA and/or kV according to patient size and/or  use of iterative reconstruction technique. COMPARISON:  Head CT dated 01/02/2022. FINDINGS: Brain: Mild age-related atrophy and chronic microvascular ischemic changes. Right cerebellar infarct as seen on the prior CTs. There is no acute intracranial hemorrhage. No mass effect or midline shift. No extra-axial fluid collection. Vascular: No hyperdense vessel or unexpected calcification. Skull: No acute calvarial pathology. Suboccipital craniectomy with herniation of the cerebellum similar to prior CT. Sinuses/Orbits: The visualized paranasal sinuses are clear. Partial opacification of the mastoid air cells bilaterally. Other: None IMPRESSION: 1. No acute intracranial hemorrhage. 2. Mild age-related atrophy and chronic microvascular ischemic changes. Right cerebellar infarct, seen previously. 3. Suboccipital craniectomy with herniation of the right cerebellum similar to prior CT. Electronically Signed   By: Anner Crete M.D.   On: 01/26/2022 01:45   Recent Labs    01/25/22 0534  WBC 5.8  HGB 11.2*  HCT 34.6*  PLT 315      Recent Labs    01/25/22 0534  NA 138  K 4.4  CL 104  CO2 27  GLUCOSE 111*  BUN 21  CREATININE 0.85  CALCIUM 8.7*       No intake or output data in the 24 hours ending 01/27/22 1637             Physical Exam: Vital Signs Blood pressure 106/67, pulse 65, temperature 98.3 F (36.8 C), temperature source Oral, resp. rate 16, height 5\' 10"  (1.778 m), weight 81 kg, SpO2 100 %.     General: awake, alert,; inappropriate, confused; in w/c  at nursing station; NAD HENT: conjugate gaze; oropharynx dry- abrasion above R eyebrow CV: regular rate; no JVD Pulmonary: CTA B/L; no W/R/R- good air movement GI: soft, NT, ND, (+)BS Psychiatric: flat, confused; thinks leaving "here" to go to "the  game" Neurological: Ox1-  Skin: intact Neurologic: Cranial nerves II through XII intact, motor strength is 5/5 in bilateral deltoid, bicep, tricep, grip, hip flexor, knee  extensors, ankle dorsiflexor and plantar flexor GU; urine remains red- light red Skin: No evidence of breakdown, no evidence of rash.   Neuro:  .alert, oriented to person, [place], [place],   can be perseverative, follows basic commands. No resting tone. Sensory exam grossly intact.     Musculoskeletal: no jt pain   Assessment/Plan: 1. Functional deficits which require 3+ hours per day of interdisciplinary therapy in a comprehensive inpatient rehab setting. Physiatrist is providing close team supervision and 24 hour management of active medical problems listed below. Physiatrist and rehab team continue to assess barriers to discharge/monitor patient progress toward functional and medical goals  Care Tool:  Bathing    Body parts bathed by patient: Right arm, Left arm, Right lower leg, Left lower leg, Face, Chest, Abdomen, Front perineal area, Buttocks, Right upper leg, Left upper leg   Body parts bathed by helper: Right arm, Left arm, Chest, Abdomen     Bathing assist Assist Level: Minimal Assistance - Patient > 75%     Upper Body Dressing/Undressing Upper body dressing   What is the patient wearing?: Pull over shirt    Upper body assist Assist Level: Supervision/Verbal cueing    Lower Body Dressing/Undressing Lower body dressing      What is the patient wearing?: Pants, Incontinence brief     Lower body assist Assist for lower body dressing: Moderate Assistance - Patient 50 - 74%     Toileting Toileting    Toileting assist Assist for toileting: Moderate Assistance - Patient 50 - 74%     Transfers Chair/bed transfer  Transfers assist     Chair/bed transfer assist level: Minimal Assistance - Patient > 75% Chair/bed transfer assistive device: Programmer, multimedia   Ambulation assist   Ambulation activity did not occur: Safety/medical concerns  Assist level: Moderate Assistance - Patient 50 - 74% Assistive device: Walker-rolling Max  distance: >150 ft   Walk 10 feet activity   Assist  Walk 10 feet activity did not occur: Safety/medical concerns  Assist level: Moderate Assistance - Patient - 50 - 74% Assistive device: Walker-rolling   Walk 50 feet activity   Assist Walk 50 feet with 2 turns activity did not occur: Safety/medical concerns  Assist level: Moderate Assistance - Patient - 50 - 74% Assistive device: Walker-rolling    Walk 150 feet activity   Assist Walk 150 feet activity did not occur: Safety/medical concerns  Assist level: Moderate Assistance - Patient - 50 - 74% Assistive device: Walker-rolling    Walk 10 feet on uneven surface  activity   Assist Walk 10 feet on uneven surfaces activity did not occur: Safety/medical concerns         Wheelchair     Assist Is the patient using a wheelchair?: Yes Type of Wheelchair: Manual Wheelchair activity did not occur: Safety/medical concerns (limited by cognitive deficits)  Wheelchair assist level: Supervision/Verbal cueing Max wheelchair distance: 57 ft    Wheelchair 50 feet with 2 turns activity    Assist    Wheelchair 50 feet with 2 turns activity did not occur: Safety/medical concerns  Assist Level: Supervision/Verbal cueing   Wheelchair 150 feet activity     Assist  Wheelchair 150 feet activity did not occur: Safety/medical concerns       Blood pressure 106/67, pulse 65, temperature 98.3 F (36.8 C), temperature source Oral, resp. rate 16, height 5\' 10"  (1.778 m), weight 81 kg, SpO2 100 %.    Medical Problem List and Plan: 1. 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  Status post posterior decompression with craniectomy 11/26/2021             -patient may not yet shower             -ELOS/Goals:   2/15.  supervision to min assist goals   Continue CIR- PT, OT and SLP- will order posey belt- since pt getting OOB; if doesn't work,  might need to upgrade, since fell x2 yesterday    -con't CIR 2.  Antithrombotics: -DVT/anticoagulation:  Pharmaceutical: Other (comment) Eliquis             -antiplatelet therapy: N/A 3. Pain Management: Tizanidine no longer ordered, Voltaren gel 4 times daily, Fioricet as needed, oxycodone as needed 4. Mood/sleep:                 -antipsychotic agents: seroquel               - sleep chart    - continue seroquel 50 mg qam and 150mg  qhs  -continue HS celexa for sleep and mood stabilization  -rx'ed UTI 2/10 agitation much improved out of restraints  -continue telesiter only for now   -continue close observation for now 2/12- pt fell x2 yesterday- nursing keeping at desk/nursing station due to confusion/trying to get OOB- keep posey belt.  5. Neuropsych: This patient is not capable of making decisions on his own behalf.  - telesitter ongoing-  6. Skin/Wound Care: Routine skin checks 7. Fluids/Electrolytes/Nutrition:   Continue foley for PEG as long as it continues to function. If he needs it long term will have balloon bumper feeding tube inserted  - on bolus feeds   - bolus schedule was adjusted, RD added further volume to TF  2/10 requested replacement of foley with appropriate feeding tube per IR 8.  Prolonged ventilatory support.  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     Decannulated 1/16- stoma closed 9.  Dysphagia.  Status post gastrostomy tube 12/13/2021 per Dr.Lovick-  -continue NPO, trials with SLP only. 10.  Aspiration pneumonia.  Antibiotic therapy completed.  -afebrile  -oob, oxygenation improved 11.  New onset atrial fibrillation.  Started Eliquis 12/17/2021.  Continue low-dose beta-blocker.    HR controlled  -appreciate cardiology consult. Discussed case with Dr. Gardiner Rhyme today.    2/10 continued hematuria   -low dose eliquis stopped 2/9   -discussed with cards yesterday  -can begin asa/plavix once urine clears up  -potential Watchman LA appendage closure  device placement? Vitals:   01/27/22 0620 01/27/22 1310  BP: (!) 106/58 106/67  Pulse: 73 65  Resp: 14 16  Temp: 97.8 F (36.6 C) 98.3 F (36.8 C)  SpO2: 100% 100%    12.  Hypertension.  Continue Norvasc 10 mg daily, Cozaar 50 mg daily, , clonidine 0.1 mg every 8 hours.  ,  2/3-2/4 BP controlled. Clonidine d/ced 2/3  2/10 don't believe this morning's bp is correct.   2/11- BP is 70s/40s again this AM- will hold Norvasc and monitor- also think pt is  dry  2/12- BP much better- soft, but 100s/60s- better- con't reigmen off norvasc 13.  Hyperlipidemia.  Lipitor 14.  History of BPH/urinary retention.        -generally has been voiding but is incontinent 15.  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  Follow-up per cardiology services. 16. 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 Has extensive hx per wife/son:     eliquis resumed at low dose 2/3 - 100k E coli UTI was rx'ed with keflex  -hgb improved to 11.7 2/6  -urology has seen, ?finasteride if can take po  -2/9-10 recurrent hematuria---dc'd eliquis 2/9   -start plavix/asa tomorrow when urine clears   -  hgb sl down to 11.2 today--recheck Monday 17. Dry/Azotemia/low BP  2/11- will see if can increase water flushes for pt-  18. Fall  2/11- CT scan was (-)- put on posey belt and telesitter- will monitor closely- no change in mentation.   2/12- pt required to be kept by nursing at nurses staiton due to agitation/restlessness.   Keep nonviolent restraints- due to falls yesterday in spite of telesitter,  LOS: 33 days A FACE TO FACE EVALUATION WAS PERFORMED  Graylin Sperling 01/27/2022, 4:37 PM

## 2022-01-27 NOTE — Progress Notes (Signed)
Nutrition Follow-up  DOCUMENTATION CODES:   Not applicable  INTERVENTION:  Provide bolus feeds via PEG using Jevity 1.5 cal formula at volume of 356 ml (1.5 cartons/ARCS, 6 total cartons per day) given QID.    Provide 45 ml Prosource TF TID per tube.    Continue free water flushes of 250 ml q 4 hours per tube.    Tube feeding regimen to provide 2256 kcal, 124 grams protein, and 2582 ml free water.    Upon discharge home, may substitute formula with Nutren 1.5 cal if unable to obtain Jevity 1.5 cal.  NUTRITION DIAGNOSIS:   Inadequate oral intake related to inability to eat as evidenced by NPO status; ongoing  GOAL:   Patient will meet greater than or equal to 90% of their needs; met with TF  MONITOR:   Labs, Weight trends, TF tolerance, Skin, I & O's  REASON FOR ASSESSMENT:   Consult Enteral/tube feeding initiation and management  ASSESSMENT:   76 year old male with history of CAD status post CABG x4 2012, hyperlipidemia, nephrolithiasis, BPH/prostate cancer. Presented 11/25/2021 with altered mental status as well as dizziness with ataxic gait and diaphoretic. 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 Pt underwent suboccipital decompressive craniectomy 11/26/2021. Pt intubated for prolonged time underwent tracheostomy 12/10/2021 and PEG tube placement 12/13/2021. Pt with decreased functional mobility and admitted to CIR.  Plans for PEG replacement per IR prior to d/c home. Pt with hypotension yesterday. Free water flushes were increased yesterday to aid in blood pressure. BP improved today. Will continue with current free water flushes.   Labs and medications reviewed.   Diet Order:   Diet Order             Diet NPO time specified  Diet effective midnight                   EDUCATION NEEDS:   Not appropriate for education at this time  Skin:  Skin Assessment: Reviewed RN Assessment Skin Integrity Issues:: Incisions Incisions: head  Last  BM:  2/10  Height:   Ht Readings from Last 1 Encounters:  12/25/21 _0  (1.778 m)    Weight:   Wt Readings from Last 1 Encounters:  01/26/22 81 kg   BMI:  Body mass index is 25.62 kg/m.  Estimated Nutritional Needs:   Kcal:  2200-2400 kcal/d  Protein:  115-125 grams  Fluid:  >/= 2 L/day  Corrin Parker, MS, RD, LDN RD pager number/after hours weekend pager number on Am[employer].

## 2022-01-27 NOTE — Progress Notes (Addendum)
Physical Therapy Session Note  Patient Details  Name: [name] MRN: [id number] Date of Birth: 13-Jul-1946  Today's Date: 01/27/2022 PT Individual Time: 0800-0900 PT Individual Time Calculation (min): 60 min   Short Term Goals: Week 4:  PT Short Term Goal 1 (Week 4): STG=LTG due to ELOS  Skilled Therapeutic Interventions/Progress Updates:     Patient in restless in bed wanting to get up upon PT arrival. Soft waist belt secured with 4 rails-up for safety with patient safe despite restlessness. Patient alert and agreeable to PT session. Patient denied pain during session.  Patient with increased language of confusion, decreased orientation to location and situation, decreased frustration tolerance with gentle orientation from PT, perseverating on finding, "the guys I came here with," reporting that he was supposed to leave to go to the [name] game with them. Patient required max cuing for redirection initially, improved as session progressed. Demonstrated increased restlessness/impulsivity with mobility requiring up to mod A for balance/safety with ambulation with RW today.   Patient did not recall recent LOB that resulted in patient hitting his head on 2/10. Laceration over L orbital dry and healing well without signs of infection.  Therapeutic Activity: Bed Mobility: Donned B thigh high TED hose with total A bed level prior to mobility. Patient performed supine to sit with min A for trunk control. Provided verbal cues for rolling through side-lying and bringing opposite shoulder forward to reduce retropulsion when coming to sitting. Patient donned pants, threading legs in sitting with min A and significant increased time and cues/facilitation for reduced retropulsion in sitting during task. He donned slip on B tennis shoes with set-up assist.  Transfers: Patient performed sit to/from stand x3 with CGA-min A for posterior lean x1 using RW. Pulled pants up in standing with CGA for balance  only. Provided verbal cues for forward weight shift and pushing his toes into the floor for reduced posterior bias. Patient able to recall correct hand placement using teach-back method.  Gait Training:  Patient ambulated >150 feet x2 using RW with min-mod A with increased instability with gait and patient verbose and internally/externally distracted throughout gait training. Ambulated with decreased R step height and length, increased R lean, poor safety awareness with use of RW (lifting off the floor), impulsivity on turns with R LOB x2, and increased forward trunk lean and downward head gaze. Provided verbal cues for pushing down into the RW to maintain contact with the floor for safety, slow/controlled stepping to reduce LOB, and erect posture and looking ahead for improved spatial awareness and foot clearance.  Once returned to the room, patient became perseverative on "organizing my things" patient sat in manual w/c and impulsively propelled himself throughout the room locating his items, utilized therapeutic use of self to redirect task using improved safety. Finally, able to redirect patient into TIS w/c for improved stability and safety.   Required increased time for behavior management throughout session, RN and charge nurse aware.   Patient in [name] w/c with soft-waist belt secured, handed off to RN for med admin at end of session with breaks locked, seat belt alarm set, and all needs within reach.   Discussed having patient sit at the nursing station for one-on-one supervision to maintain patient safety with increased behaviors today with RN and charge nurse, both in agreement and reported adequate staffing to accommodate this today. Plan for patient to sit at nursing station following med admin with RN.   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

## 2022-01-27 NOTE — Progress Notes (Signed)
Patient restless throughout the night. New PRN order of 75mg  ordered and given. Soft posey belt ordered and put on patient while in bed. Effective for 2 hours before patient was restless and tried getting out of bed again. PRN order of seroquel given. Patient currently in bed with posey belt in place and telesitter active. Will continue with plan of care.

## 2022-01-27 NOTE — Progress Notes (Signed)
Speech Language Pathology Daily Session Note  Patient Details  Name: Armin Yerger MRN: [id number] Date of Birth: 14-Oct-1946  Today's Date: 01/27/2022 SLP Individual Time: 9733-1250 SLP Individual Time Calculation (min): 40 min  Short Term Goals: Week 4: SLP Short Term Goal 1 (Week 4): STGs=LTGs due to ELOS SLP Short Term Goal 1 - Progress (Week 4): Not met  Skilled Therapeutic Interventions: Skilled SLP intervention focused on dysphagia and cognition. SLP facilitated simple conversation with focus on sustained attention and topic maintenance. Pt responded to questions with topics unrelated to questions asked demonstrated overall unorganized responses and decreased coherence. Some improvement noted with responses when given a choice of 2 and questions repeated. Oral care completed with suctioning prior to po trials . He consumed 5/5 1/2 tsp of thin liquid with no overt s/sx of aspiration or penetration. Cont with therapy per plan of care. Pt left seated up at nurses station with RN present. Cont with therapy per plan of care.      Pain Pain Assessment Pain Score: 0-No pain Faces Pain Scale: No hurt  Therapy/Group: Individual Therapy  Darrol Poke Loralee Weitzman 01/27/2022, 12:26 PM

## 2022-01-28 LAB — BASIC METABOLIC PANEL
Anion gap: 8 (ref 5–15)
BUN: 22 mg/dL (ref 8–23)
CO2: 28 mmol/L (ref 22–32)
Calcium: 8.7 mg/dL — ABNORMAL LOW (ref 8.9–10.3)
Chloride: 103 mmol/L (ref 98–111)
Creatinine, Ser: 0.86 mg/dL (ref 0.61–1.24)
GFR, Estimated: >60 mL/min (ref >60)
Glucose, Bld: 92 mg/dL (ref 70–99)
Potassium: 3.9 mmol/L (ref 3.5–5.1)
Sodium: 139 mmol/L (ref 135–145)

## 2022-01-28 LAB — CBC
HCT: 31.5 % — ABNORMAL LOW (ref 39.0–52.0)
Hemoglobin: 10.1 g/dL — ABNORMAL LOW (ref 13.0–17.0)
MCH: 30.2 pg (ref 26.0–34.0)
MCHC: 32.1 g/dL (ref 30.0–36.0)
MCV: 94.3 fL (ref 80.0–100.0)
Platelets: 289 10*3/uL (ref 150–400)
RBC: 3.34 MIL/uL — ABNORMAL LOW (ref 4.22–5.81)
RDW: 15.8 % — ABNORMAL HIGH (ref 11.5–15.5)
WBC: 5.8 10*3/uL (ref 4.0–10.5)
nRBC: 0 % (ref 0.0–0.2)

## 2022-01-28 MED ORDER — ASPIRIN 81 MG PO CHEW
CHEWABLE_TABLET | ORAL | Status: AC
  Administered 2022-01-28: 81 mg
  Filled 2022-01-28: qty 1

## 2022-01-28 MED ORDER — CITALOPRAM HYDROBROMIDE 20 MG PO TABS
20.0000 mg | ORAL_TABLET | Freq: Every day | ORAL | Status: DC
  Administered 2022-01-28 – 2022-02-07 (×11): 20 mg
  Filled 2022-01-28 (×11): qty 1

## 2022-01-28 MED ORDER — CEPHALEXIN 250 MG/5ML PO SUSR
500.0000 mg | Freq: Two times a day (BID) | ORAL | Status: AC
Start: 2022-01-28 — End: 2022-02-07
  Administered 2022-01-28 – 2022-02-07 (×19): 500 mg
  Filled 2022-01-28 (×23): qty 10

## 2022-01-28 MED ORDER — ASPIRIN 81 MG PO CHEW
81.0000 mg | CHEWABLE_TABLET | Freq: Every day | ORAL | Status: DC
  Administered 2022-01-29 – 2022-01-31 (×3): 81 mg
  Filled 2022-01-28 (×4): qty 1

## 2022-01-28 MED ORDER — HALOPERIDOL 1 MG PO TABS
1.0000 mg | ORAL_TABLET | Freq: Once | ORAL | Status: AC
  Filled 2022-01-28: qty 1

## 2022-01-28 MED ORDER — CLOPIDOGREL BISULFATE 75 MG PO TABS
75.0000 mg | ORAL_TABLET | Freq: Every day | ORAL | Status: DC
  Administered 2022-01-28 – 2022-01-31 (×4): 75 mg
  Filled 2022-01-28 (×5): qty 1

## 2022-01-28 MED ORDER — HALOPERIDOL LACTATE 5 MG/ML IJ SOLN
1.0000 mg | Freq: Once | INTRAMUSCULAR | Status: AC
  Administered 2022-01-28: 1 mg via INTRAMUSCULAR
  Filled 2022-01-28: qty 0.2

## 2022-01-28 NOTE — Progress Notes (Addendum)
Occupational Therapy Session Note  Patient Details  Name: [name] MRN: [id number] Date of Birth: 08-31-1946  Today's Date: 01/28/2022 OT Individual Time: 1191-4782 OT Individual Time Calculation (min): 30 min    Short Term Goals: Week 4:  OT Short Term Goal 1 (Week 4): STG = LTG d/t ELOS OT Short Term Goal 1 - Progress (Week 4): Progressing toward goal  Skilled Therapeutic Interventions/Progress Updates:    Pt resting in TIS w/c upon arrival with SLP present. Pt denies pain this morning. OT intervention with focus on sequencing, pattern replication, attention to task, and RUE GMC/FMC tasks. Pt replicated simplete alternating peg board tasks with supervision. Pt required min verbal cues for accuracy with medium complex pattern. Pt attempted to used RUE for all tasks but demonstrated decreased Emmet control with open chain tasks. Pt remained in w/c with Posey belt in place/secure and handed off to nursing staff at desk.  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      Therapy/Group: Individual Therapy  Leroy Libman 01/28/2022, 12:03 PM

## 2022-01-28 NOTE — Progress Notes (Signed)
**  Late Entry**   01/25/22 1416  What Happened  Was fall witnessed? No  Who witnessed fall? Destenae, OT  Patients activity before fall to/from bed, chair, or stretcher  Point of contact head;arm/shoulder  Was patient injured? Yes  Follow Up  MD notified Algis Liming PA  Time MD notified 1520  Family notified Yes - comment  Time family notified 1520  Additional tests Yes-comment (head ct ordered)  Simple treatment Dressing (PA applied surgical skin glue and dressing)  Progress note created (see row info) Yes  Adult Fall Risk Assessment  Risk Factor Category (scoring not indicated) Fall has occurred during this admission (document High fall risk)  Patient Fall Risk Level High fall risk  Adult Fall Risk Interventions  Required Bundle Interventions *See Row Information* High fall risk - low, moderate, and high requirements implemented  Additional Interventions Assess orthostatic BP;Camera surveillance (with patient/family notification & education);Family Supervision;Room near nurses station;Safety Sitter/Safety Rounder  Screening for Fall Injury Risk (To be completed on HIGH fall risk patients) - Assessing Need for Floor Mats  Risk For Fall Injury- Criteria for Floor Mats Previous fall this admission;Confusion/dementia (+NuDESC, CIWA, TBI, etc.);TeleSitter camera in use  Will Implement Floor Mats Yes  Vitals  Temp 98.2 F (36.8 C)  BP (!) 82/49 (RN [place] notified)  MAP (mmHg) (!) 60  BP Location Left Arm  BP Method Automatic  Patient Position (if appropriate) Sitting  Pulse Rate 72  Pulse Rate Source Monitor  Resp 18  Oxygen Therapy  SpO2 100 %  O2 Device Room Air  Pain Assessment  Pain Scale 0-10  Pain Score 5  Pain Type Acute pain  Pain Location Head  Pain Orientation Anterior  Pain Descriptors / Indicators Headache  Pain Frequency Intermittent  Pain Onset Sudden  Patients Stated Pain Goal 0  Pain Intervention(s) Medication (See eMAR)  Neurological  Neuro (WDL) X   Level of Consciousness Alert  Orientation Level Oriented to person  Cognition Poor attention/concentration;Poor judgement;Poor safety awareness  Speech Clear

## 2022-01-28 NOTE — Progress Notes (Signed)
PROGRESS NOTE     Pt in bed. Had another fall x2 this weekend, in waist belt now. Knows he's supposed to go home this week.  ROS: Limited due to cognitive/behavioral    Objective:   No results found. Recent Labs    01/28/22 0506  WBC 5.8  HGB 10.1*  HCT 31.5*  PLT 289      Recent Labs    01/28/22 0506  NA 139  K 3.9  CL 103  CO2 28  GLUCOSE 92  BUN 22  CREATININE 0.86  CALCIUM 8.7*        Intake/Output Summary (Last 24 hours) at 01/28/2022 1119 Last data filed at 01/28/2022 0700 Gross per 24 hour  Intake 0 ml  Output 750 ml  Net -750 ml               Physical Exam: Vital Signs Blood pressure (!) 116/59, pulse 64, temperature 99.1 F (37.3 C), temperature source Oral, resp. rate 16, height 5\' 10"  (1.778 m), weight 84.1 kg, SpO2 95 %.    Constitutional: No distress . Vital signs reviewed. HEENT: NCAT, EOMI, oral membranes moist Neck: supple Cardiovascular: RRR without murmur. No JVD    Respiratory/Chest: CTA Bilaterally without wheezes or rales. Normal effort    GI/Abdomen: BS +, non-tender, non-distended, foley in place in abdomen, binder Ext: no clubbing, cyanosis, or edema Psych:  cooperative, non-agitated Neurologic: oriented to person, [place] basic commands. Cranial nerves II through XII intact, motor strength is 5/5 in bilateral deltoid, bicep, tricep, grip, hip flexor, knee extensors, ankle dorsiflexor and plantar flexor GU; urine remains red- light red Skin: No evidence of breakdown, no evidence of rash.  Lac over right eye brow Neuro:  .alert, oriented to person, [place], [place],   can be perseverative, follows basic commands. No resting tone. Sensory exam grossly intact.     Musculoskeletal: no jt pain   Assessment/Plan: 1. Functional deficits which require 3+ hours per day of interdisciplinary therapy in a comprehensive inpatient rehab  setting. Physiatrist is providing close team supervision and 24 hour management of active medical problems listed below. Physiatrist and rehab team continue to assess barriers to discharge/monitor patient progress toward functional and medical goals  Care Tool:  Bathing    Body parts bathed by patient: Right arm, Left arm, Right lower leg, Left lower leg, Face, Chest, Abdomen, Front perineal area, Buttocks, Right upper leg, Left upper leg   Body parts bathed by helper: Right arm, Left arm, Chest, Abdomen     Bathing assist Assist Level: Minimal Assistance - Patient > 75%     Upper Body Dressing/Undressing Upper body dressing   What is the patient wearing?: Pull over shirt    Upper body assist Assist Level: Supervision/Verbal cueing    Lower Body Dressing/Undressing Lower body dressing      What is the patient wearing?: Pants, Incontinence brief     Lower body assist Assist for lower body dressing: Moderate Assistance - Patient 50 - 74%     Toileting Toileting    Toileting assist Assist for toileting: Moderate Assistance - Patient 50 - 74%     Transfers Chair/bed transfer  Transfers assist     Chair/bed transfer assist level: Minimal Assistance - Patient > 75% Chair/bed transfer assistive device: Programmer, multimedia   Ambulation assist   Ambulation activity did not occur: Safety/medical concerns  Assist level: Moderate Assistance - Patient 50 - 74% Assistive device: Walker-rolling Max distance: >150 ft   Walk 10 feet activity   Assist  Walk 10 feet activity did not occur: Safety/medical concerns  Assist level: Moderate Assistance - Patient - 50 - 74% Assistive device: Walker-rolling   Walk 50 feet activity   Assist Walk 50 feet with 2 turns activity did not occur: Safety/medical concerns  Assist level: Moderate Assistance - Patient - 50 - 74% Assistive device: Walker-rolling    Walk 150 feet activity   Assist Walk 150 feet  activity did not occur: Safety/medical concerns  Assist level: Moderate Assistance - Patient - 50 - 74% Assistive device: Walker-rolling    Walk 10 feet on uneven surface  activity   Assist Walk 10 feet on uneven surfaces activity did not occur: Safety/medical concerns         Wheelchair     Assist Is the patient using a wheelchair?: Yes Type of Wheelchair: Manual Wheelchair activity did not occur: Safety/medical concerns (limited by cognitive deficits)  Wheelchair assist level: Supervision/Verbal cueing Max wheelchair distance: 57 ft    Wheelchair 50 feet with 2 turns activity    Assist    Wheelchair 50 feet with 2 turns activity did not occur: Safety/medical concerns   Assist Level: Supervision/Verbal cueing   Wheelchair 150 feet activity     Assist  Wheelchair 150 feet activity did not occur: Safety/medical concerns       Blood pressure (!) 116/59, pulse 64, temperature 99.1 F (37.3 C), temperature source Oral, resp. rate 16, height 5\' 10"  (1.778 m), weight 84.1 kg, SpO2 95 %.    Medical Problem List and Plan: 1. 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  Status post posterior decompression with craniectomy 11/26/2021             -patient may not yet shower             -ELOS/Goals:   2/15.  supervision to min assist goals   - discuss discharge/dispo with team tomorrow in conference  --Continue CIR therapies including PT, OT, and SLP  2.  Antithrombotics: -DVT/anticoagulation:  Pharmaceutical: Other (comment) Eliquis             -antiplatelet therapy: N/A 3. Pain Management: Tizanidine no longer ordered, Voltaren gel 4 times daily, Fioricet as needed, oxycodone as needed 4. Mood/sleep:                 -antipsychotic agents: seroquel               - sleep chart    - continue seroquel 50 mg qam and 150mg  qhs  -continue HS celexa for sleep and mood  stabilization  -rx'ed UTI 2/13 back in [place] for fall risk  -increase celexa to 20mg  qhs  -restraints have made him more agitated before 5. Neuropsych: This patient is not capable of making decisions on his own behalf.  - telesitter ongoing-  6. Skin/Wound Care: Routine skin checks 7. Fluids/Electrolytes/Nutrition:   Continue foley for PEG as long as it continues to function. If he needs it long term will have balloon bumper feeding tube inserted  - on bolus feeds   - bolus  schedule was adjusted, RD added further volume to TF  2/13 On Friday, Irequested replacement of foley with appropriate feeding tube per IR. Will follow up today 8.  Prolonged ventilatory support.  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     Decannulated 1/16- stoma closed 9.  Dysphagia.  Status post gastrostomy tube 12/13/2021 per Dr.Lovick-  -continue NPO, trials with SLP only. 10.  Aspiration pneumonia.  Antibiotic therapy completed.  -afebrile  -oob, oxygenation improved 11.  New onset atrial fibrillation.  Started Eliquis 12/17/2021.  Continue low-dose beta-blocker.    HR controlled  -appreciate cardiology consult. Discussed case with Dr. Gardiner Rhyme today.    2/13 urine now clear   -low dose eliquis stopped 2/9   -discussed with cards   -begin asa/plavix today  -potential Watchman LA appendage closure device placement? Vitals:   01/27/22 2005 01/28/22 0430  BP: 105/60 (!) 116/59  Pulse: 66 64  Resp: 16 16  Temp: 98.4 F (36.9 C) 99.1 F (37.3 C)  SpO2: 100% 95%    12.  Hypertension.  Continue Norvasc 10 mg daily, Cozaar 50 mg daily, , clonidine 0.1 mg every 8 hours.  ,  2/3-2/4 BP controlled. Clonidine d/ced 2/3  2/10 don't believe this morning's bp is correct.   2/13 bp better off norvasc, increase H2O through PEG 13.  Hyperlipidemia.  Lipitor 14.  History of BPH/urinary retention.        -generally has been voiding but is incontinent 15.  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  Follow-up per cardiology services. 16. 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 Has extensive hx per wife/son:     eliquis resumed at low dose 2/3 - 100k E coli UTI was rx'ed with keflex  -hgb improved to 11.7 2/6  -urology has seen, ?finasteride if can take po  -2/12 Hgb down to 10.1 today, urine clear though 17. Dry/Azotemia/low BP  2/13 H2O flushes increased this weekend. (250cc q4) 18. Fall  2/13 CT negative for acute changes   -now in waist belt which makes him agitated somewhat   -telesitter     LOS: 34 days A FACE TO [place] 01/28/2022, 11:19 AM

## 2022-01-28 NOTE — Progress Notes (Signed)
Patient ID: [name], male   DOB: [birth year], 76 y.o.   MRN: [id number]  SW spoke with Amy Allen/[facility] enteral feed dept [phone] option #5, ext [phone]/f: [phone]) to discuss clinicals received. Reports clinicals received. Will send CMN, and will f/u with the family to discuss. Patient will need 5 days worth of TF at discharge as items can not be shipped until date of discharge.  *SW met with nursing staff and working on ordering TF for patient to have at discharge.   [name], MSW, [place] Office: [phone] Cell: [phone] Fax: [phone]

## 2022-01-28 NOTE — Progress Notes (Signed)
Speech Language Pathology Daily Session Note  Patient Details  Name: [name] MRN: [id number] Date of Birth: [birth year]  Today's Date: 01/28/2022 SLP Individual Time: 1005-1045 SLP Individual Time Calculation (min): 40 min  Short Term Goals: Week 5: SLP Short Term Goal 1 (Week 5): STGs=LTGs due to ELOS  Skilled Therapeutic Interventions: Skilled treatment session focused on cognitive goals. Upon arrival, patient was awake in bed and reading the newspaper. Patient with increased confusion today and reported going on a trip tonight with his parents and multiple other family members. Patient was independently oriented to place but required total A for orientation to date and for use of a calendar. Patient sat EOB with supervision but required Min A for sitting balance due to posterior lean. With +2 assist for safety, patient was transferred to the wheelchair with Mod A. SLP facilitated session by providing Max A multimodal cues for a basic problem solving task while utilizing the newspaper. Throughout session, patient was verbose with language of confusion requiring overall Mod A verbal cues for redirection. Patient handed off to OT. Continue with current plan of care.      Pain No/Denies Pain   Therapy/Group: Individual Therapy  Kniyah Khun 01/28/2022, 11:59 AM

## 2022-01-28 NOTE — Progress Notes (Signed)
**   late entry **   01/25/22 1047  What Happened  Was fall witnessed? Yes  Who witnessed fall? Destenae, OT  Patients activity before fall shower  Point of contact head  Was patient injured? No  Follow Up  MD notified Algis Liming PA  Time MD notified 69  Family notified Yes - comment (sister was present)  Time family notified 1050  Additional tests No  Progress note created (see row info) Yes  Adult Fall Risk Assessment  Risk Factor Category (scoring not indicated) Fall has occurred during this admission (document High fall risk)  Patient Fall Risk Level High fall risk  Adult Fall Risk Interventions  Required Bundle Interventions *See Row Information* High fall risk - low, moderate, and high requirements implemented  Additional Interventions Camera surveillance (with patient/family notification & education);Family Supervision;Assess orthostatic BP;Lap belt while in chair/wheelchair (Rehab only);Room near nurses station;Safety Sitter/Safety Rounder  Screening for Fall Injury Risk (To be completed on HIGH fall risk patients) - Assessing Need for Floor Mats  Risk For Fall Injury- Criteria for Floor Mats Previous fall this admission;TeleSitter camera in use;Confusion/dementia (+NuDESC, CIWA, TBI, etc.)  Will Implement Floor Mats Yes  Vitals  BP (!) 134/111  MAP (mmHg) 120  BP Method Automatic  Pulse Rate 81  Pain Assessment  Pain Scale 0-10  Pain Score 0  Neurological  Neuro (WDL) X  Level of Consciousness Alert  Orientation Level Oriented to person  Cognition Poor attention/concentration;Poor safety awareness;Poor judgement  Speech Clear

## 2022-01-28 NOTE — Progress Notes (Signed)
Patient is anxious and agitated trying to roll out the side of the bed. Patient requested to go to the bathroom, 3 staff members assisted patient to the bathroom for safety. Writer standing on the right side of the toilet for patient safety and patient yelling that staff was in the bathroom. Writer tried to educate patient that we were unable to leave him alone in the bathroom when patient Chief Strategy Officer in the throat with his arm/fist. Staff remained in bathroom until patient finished urinating, patient assisted back to bed. Dr. Naaman Plummer notified, 1 x now haldol 1 mg IM, and UA CNS order. Patient given Haldol IM in right deltoid., with 1 staff assist. Wife at bedside.

## 2022-01-28 NOTE — Progress Notes (Signed)
Physical Therapy TBI Note  Patient Details  Name: [name] MRN: [id number] Date of Birth: Oct 17, 1946  Today's Date: 01/28/2022 PT Individual Time: 1601-0932 PT Individual Time Calculation (min): 90 min   Short Term Goals: Week 4:  PT Short Term Goal 1 (Week 4): STG=LTG due to ELOS  Skilled Therapeutic Interventions/Progress Updates:     Patient in bed with his wife at bedside handed-off from [place], [place] for continued family education upon PT arrival. Patient alert and agreeable to PT session. Patient denied pain during session.  Patient presented with disorientation and confusion, perseverated on "finding the guys to go to the event," and became agitated and frustrated when he was redirected from walking the halls or going on the elevator to look for them. Required a second staff member and assist from the patient's wife to redirect patient from the elevators. Patient expressed frustration with safety measures taken to reduce risk of injury to patient. Demonstrates poor incite into deficits, reports "I can walk on my own just fine." While needing Mod A to maintain balance using a RW due to frequent LOB with mobility during session due to impulsivity and poor safety awareness with present balance deficits and ataxia.   Spent increased time educating on home modifications and safety measure to reduce fall risk and risk of harm or elopement at home. Discussed strategies for redirecting unsafe behaviors or agitation and need for 24/7 close supervision due to current impulsivity and poor safety awareness. Patient's wife reports that she will be home all the time and have a second person to help when she is working or needs to be otherwise occupied. Her son will also be staying with them full time to assist with care and they have many supportive friends that are able to assist. Recommended limited visitors initially, outside a select group of additional caregivers, to reduced overstimulation and to  use a quiet room as a safe space for the patient to take breaks with behaviors escalate at home.   Patient's wife receptive to all education, however, expressed concerns about the patient's decline in cognition and mobility in the past few days resulting in multiple falls and use of a soft waist belt restraint to maintain patient's safety. Will pass on concerns to rehab team during team conference tomorrow to discuss needs for adjustments to POC based on patient's functional decline.   Therapeutic Activity: Bed Mobility: Patient performed supine to/from sit independently and spontaneously x2 during session from a flat bed. PT and his wife provided verbal cues for safety awareness and management of impulsivity for patient safety. Transfers: Patient performed sit to/from stand x3 with CGA-min A for posterior lean x1 using RW. Provided verbal cues for forward weight shift and pushing his toes into the floor for reduced posterior bias. Patient unable to follow cues for correct hand placement due to impulsivity this session.   Gait Training:  Patient ambulated >150 feet x2 using RW with min-mod A with increased instability with gait and patient verbose and internally/externally distracted throughout gait training. Ambulated with decreased R step height and length, increased R lean, poor safety awareness with use of RW (lifting off the floor), impulsivity on turns with R LOB x2, and increased forward trunk lean and downward head gaze. Provided verbal cues for pushing down into the RW to maintain contact with the floor for safety, slow/controlled stepping to reduce LOB, and erect posture and looking ahead for improved spatial awareness and foot clearance. Patient ascended/descended 4x6" steps using L rail with  B upper extremity support with min A. Performed reciprocal gait pattern throughout. Provided cues for safety. Patient's wife demonstrated safe guarding with PT acting as +2 STB and she provided cues for  safety awareness and redirecting him to the task.  Did not have patient's wife perform assist during ambulation due to escalation of agitation when returning to his room for patient and family safety.    Patient in bed with soft waist belt secured  with his wife at beside at end of session with breaks locked, bed alarm set, and all needs within reach.  Patient initially refused the soft waist belt restraint. Required >20 min and a call to his son to be able to safely and calmly apply the restraint due to patient's increased agitation.   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 Agitated Behavior Scale: TBI Observation Details Observation Environment: CIR Start of observation period - Date: 01/28/22 Start of observation period - Time: 1505 End of observation period - Date: 01/28/22 End of observation period - Time: 1627 Agitated Behavior Scale (DO NOT LEAVE BLANKS) Short attention span, easy distractibility, inability to concentrate: Present to an extreme degree Impulsive, impatient, low tolerance for pain or frustration: Present to an extreme degree Uncooperative, resistant to care, demanding: Present to an extreme degree Violent and/or threatening violence toward people or property: Present to an extreme degree Explosive and/or unpredictable anger: Present to an extreme degree Rocking, rubbing, moaning, or other self-stimulating behavior: Present to a slight degree Pulling at tubes, restraints, etc.: Present to a slight degree Wandering from treatment areas: Absent Restlessness, pacing, excessive movement: Present to an extreme degree Repetitive behaviors, motor, and/or verbal: Present to an extreme  degree Rapid, loud, or excessive talking: Present to an extreme degree Sudden changes of mood: Present to a moderate degree Easily initiated or excessive crying and/or laughter: Absent Self-abusiveness, physical and/or verbal: Present to an extreme degree Agitated behavior scale total score: 45    Therapy/Group: Individual Therapy  Nijae Doyel L Donyel Nester PT, DPT  01/28/2022, 4:36 PM

## 2022-01-28 NOTE — Progress Notes (Signed)
Occupational Therapy Session Note  Patient Details  Name: [name] MRN: [id number] Date of Birth: 12/23/1945  Today's Date: 01/28/2022 OT Individual Time: 1300-1400 OT Individual Time Calculation (min): 60 min    Short Term Goals: Week 5:  OT Short Term Goal 1 (Week 5): Continue progressing toward established LTG  Skilled Therapeutic Interventions/Progress Updates:    Pt received sitting in the w/c at the nurses desk. Session focused on family education session with his wife [name]. Focused on fall risk reduction, home accessibility, home schedule/appropriate activities, and hands on practice. Pt was very perseverative throughout session and required frequent redirection to task. Provided edu on this to terri as well. He declined any ADLs and was preservative on packing his things up. He was able to be redirected to task and his wife arrived for edu. Discussed equipment being sent home and adjusted his RW. Pt was taken to the ADL apt to review previous fall and ways to prevent this at home. Terri provided hands on assist as pt completed a bed transfer and toilet transfer. Min A overall. He was found to be incontinent of urine and was assisted back to his room. He became very agitated and required redirection. He was assisted back to bed to rest, both cognitively and physically. Provided edu to wife and answered questions to her satisfaction. Pt passed off to PT in room.   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   Therapy/Group: Individual Therapy  [name] 01/28/2022, 6:18 AM

## 2022-01-28 NOTE — Progress Notes (Addendum)
Received a call from [place] at 18:50 reporting Mr. Wavra was agitated with mental changes, Dr Naaman Plummer note was reviewed. CT of  Head without contrast ordered, will continue to monitor.

## 2022-01-29 ENCOUNTER — Inpatient Hospital Stay (HOSPITAL_COMMUNITY): Payer: Medicare Other

## 2022-01-29 HISTORY — PX: IR REPLC GASTRO/COLONIC TUBE PERCUT W/FLUORO: IMG2333

## 2022-01-29 LAB — URINALYSIS, ROUTINE W REFLEX MICROSCOPIC
Bilirubin Urine: NEGATIVE
Glucose, UA: NEGATIVE mg/dL
Ketones, ur: NEGATIVE mg/dL
Nitrite: NEGATIVE
Protein, ur: 30 mg/dL — AB
RBC / HPF: >50 RBC/hpf — ABNORMAL HIGH (ref 0–5)
Specific Gravity, Urine: 1.008 (ref 1.005–1.030)
WBC, UA: >50 WBC/hpf — ABNORMAL HIGH (ref 0–5)
pH: 6 (ref 5.0–8.0)

## 2022-01-29 IMAGING — XA IR REPLACE G-TUBE/COLONIC TUBE
1 series · 4 of 4 positions shown · non-contrast
Comparison: None

INDICATION: Patient with history of dysphagia. Request is for gastrostomy tube
replacement.

EXAM:
GASTROSTOMY TUBE EXCHANGE UNDER FLUOROSCOPY

[Series 1: fl (-) angio · 4 of 38 frames shown]
[frame 1/38]
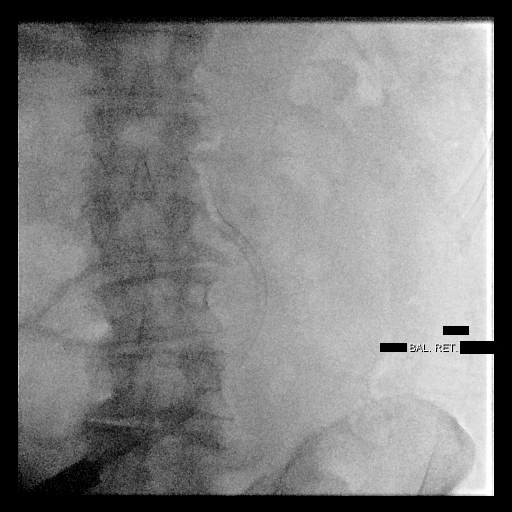
[frame 6/38]
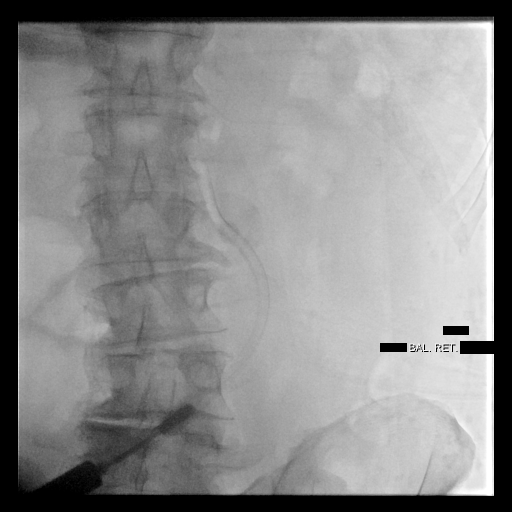
[frame 20/38]
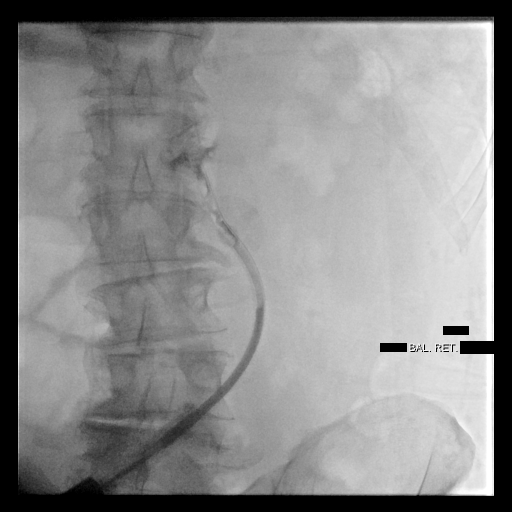
[frame 33/38]
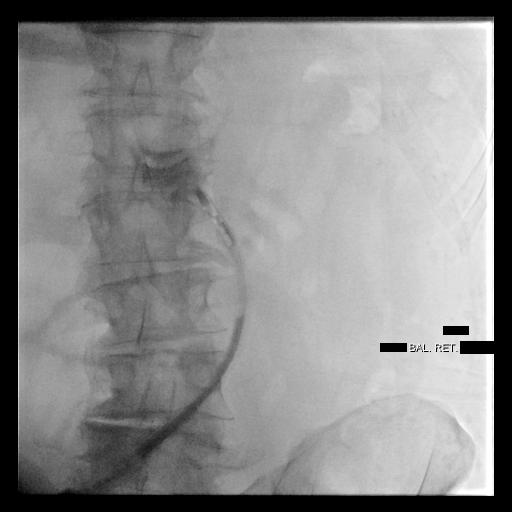

[4 of 4 positions shown; findings below may reference images not displayed]

MEDICATIONS:
No; Antibiotics were administered within 1 hour of the procedure.

CONTRAST:  7 mL of Isovue 300 administered into the gastric lumen.

ANESTHESIA/SEDATION:
Moderate (conscious) sedation was not employed during this procedure

FLUOROSCOPY TIME:  0 minutes 6 seconds (1 mGy)

COMPLICATIONS:
None immediate.

PROCEDURE:
Informed written consent was obtained from the patient and/or
patient's representative following explanation of the procedure,
risks, benefits and alternatives. A time out was performed prior to
the initiation of the procedure. Ultrasound scanning was performed
to demarcate the edge of the left lobe of the liver. Maximal barrier
sterile technique utilized including caps, mask, sterile gowns,
sterile gloves, large sterile drape, hand hygiene and Betadine prep.

The right upper quadrant was sterilely prepped and draped. Under
intermittent fluoroscopic guidance, the place DANII catheter was
exchanged for a Amplatz, ultimately allowing placement of a
22-French balloon retention gastrostomy tube. The retention balloon
was insufflated with a mixture of dilute saline and pulled taut
against the anterior wall of the stomach. The external disc was
cinched. Contrast injection confirms positioning within the stomach.
Spot radiographic images confirmed placement within the stomach.
The patient tolerated procedure well without immediate post
procedural complication.
FINDINGS: After successful fluoroscopic guided placement, the gastrostomy tube
is appropriately positioned with internal retention balloon against
the ventral aspect of the gastric lumen.
IMPRESSION: Successful fluoroscopic exchange for a 22 Fr balloon retention
gastrostomy tube.

The gastrostomy tube is available to use at this time.

Read by: DANII, NP

PLAN:
The patient should [REDACTED] for routine
gastrostomy catheter exchange in 6 months.

## 2022-01-29 IMAGING — CT CT HEAD W/O CM
3 series · 15 of 47 positions shown, 18 images · non-contrast
Comparison: Head CT [DATE] and earlier.

CLINICAL DATA: 75-year-old male with agitation. History of
bilateral cerebellar infarct in [REDACTED], posterior fossa
craniectomy with some cerebellar herniation



[Series 3: head 5.0 h30s · axial · 0.47mm/px · z∈[-131,+19]mm · 9 of 36 slices shown, 12 images]
[im 3/36  brain]
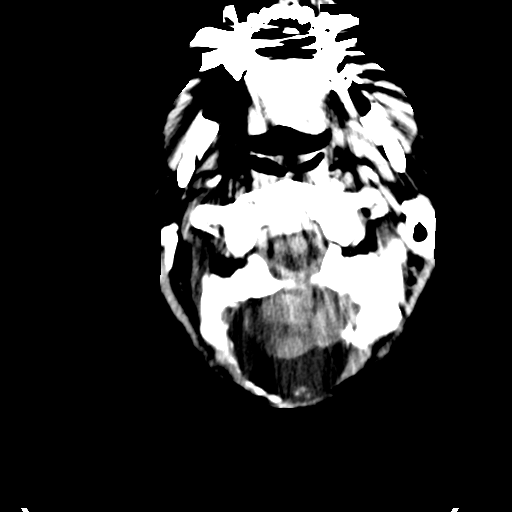
[im 3/36  bone]
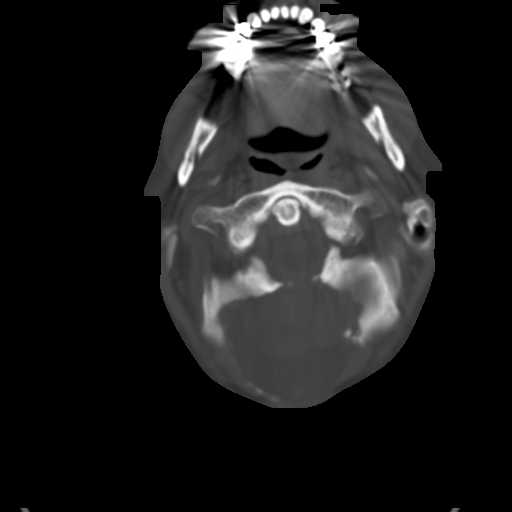
[im 7/36  brain]
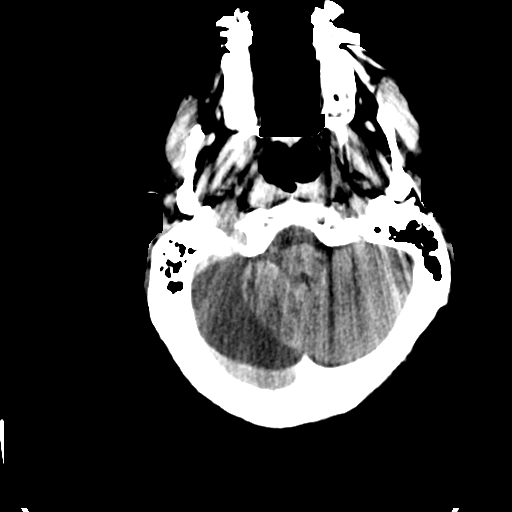
[im 10/36  brain]
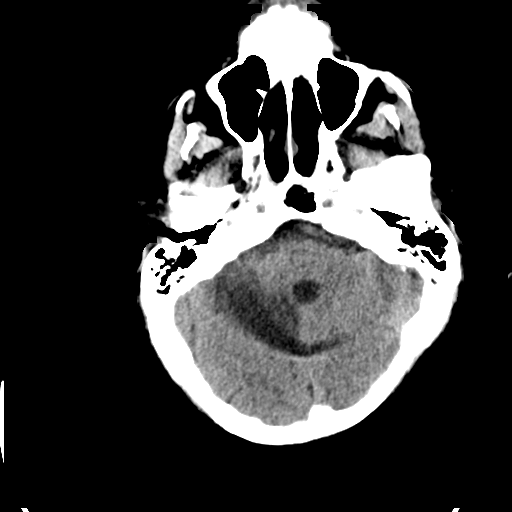
[im 14/36  brain]
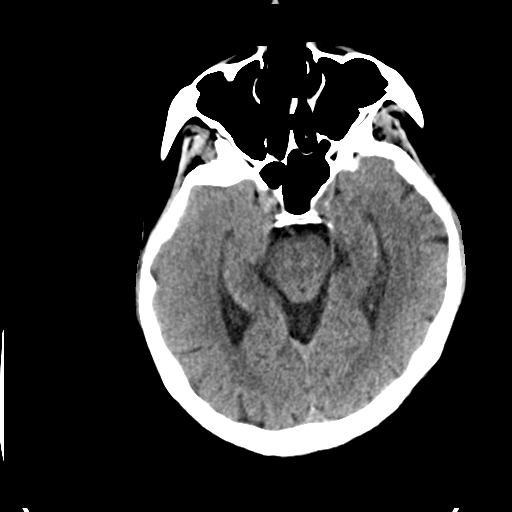
[im 19/36  brain]
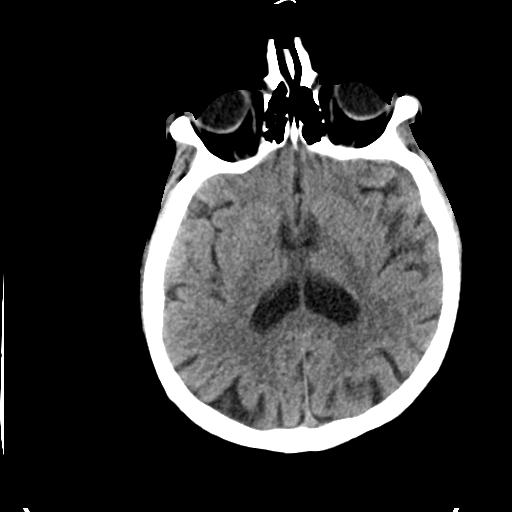
[im 19/36  bone]
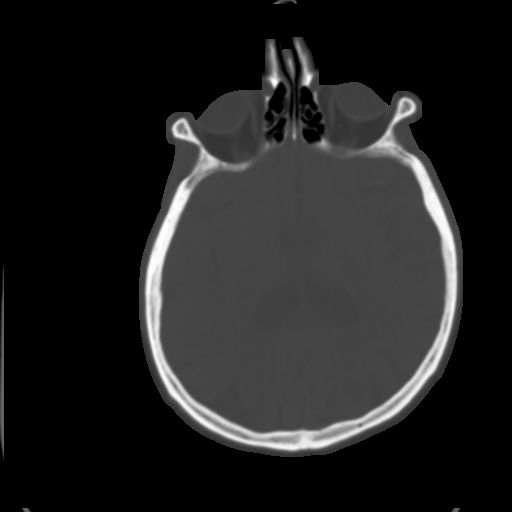
[im 22/36  brain]
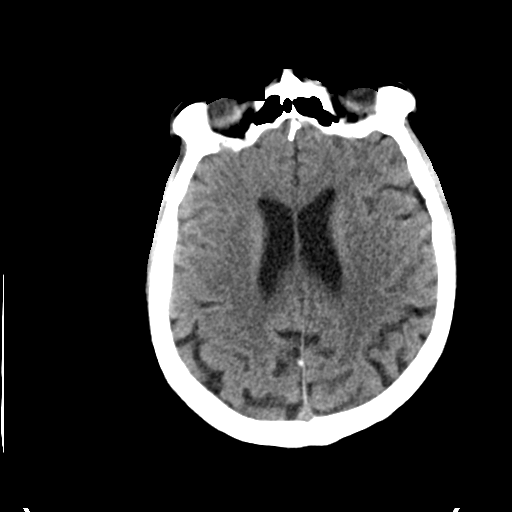
[im 26/36  brain]
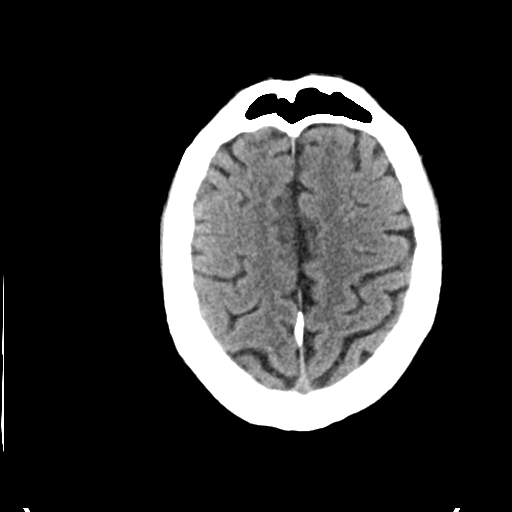
[im 29/36  brain]
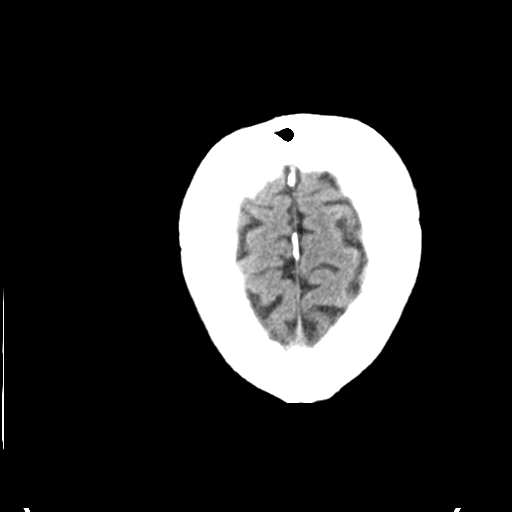
[im 33/36  brain]
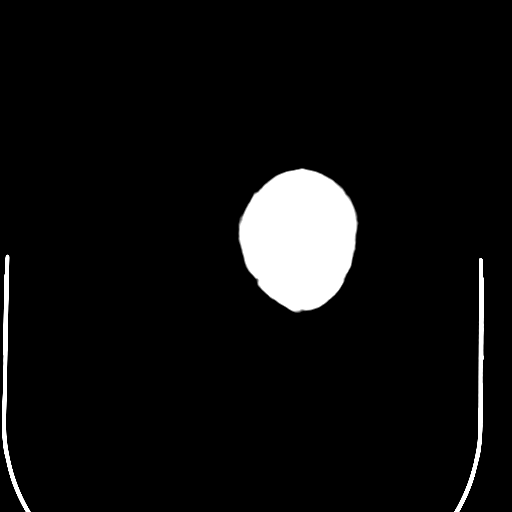
[im 33/36  bone]
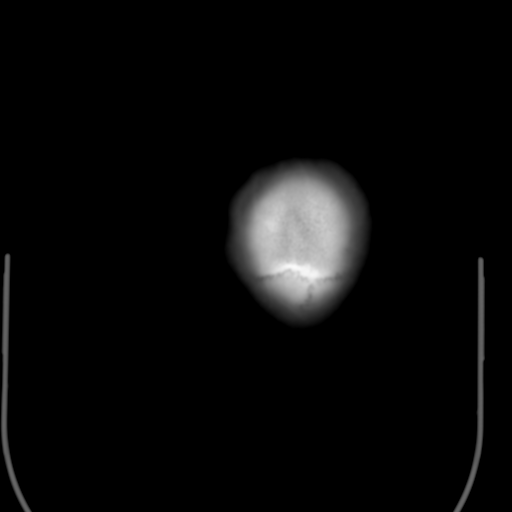

[Series 5: head 3.0 mpr cor · coronal · 0.35mm/px · 3 of 68 slices shown]
[im 23/68  brain]
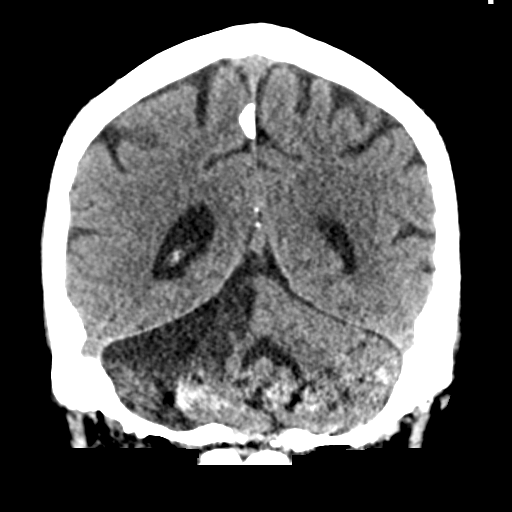
[im 30/68  brain]
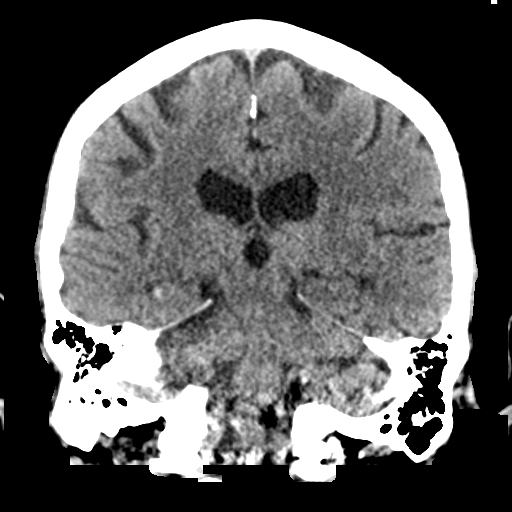
[im 38/68  brain]
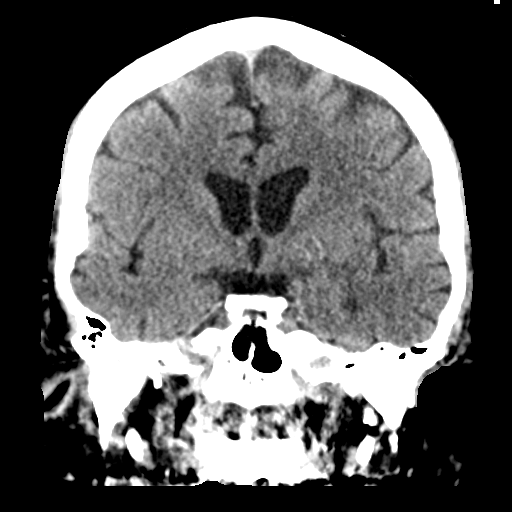

[Series 6: head 3.0 mpr sag · sagittal · 0.35mm/px · 3 of 63 slices shown]
[im 21/63  brain]
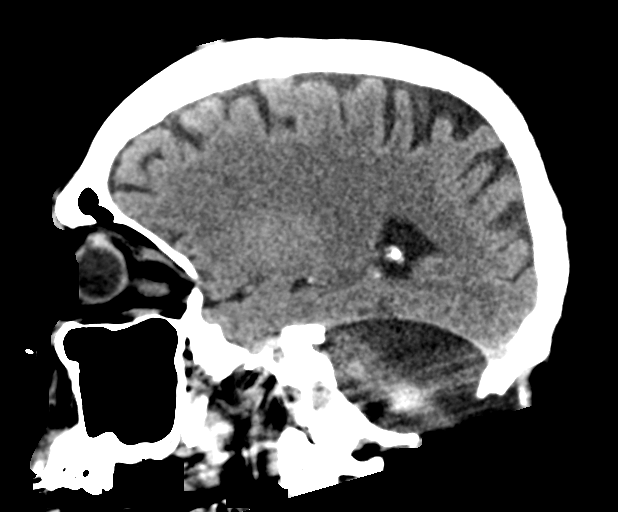
[im 32/63  brain]
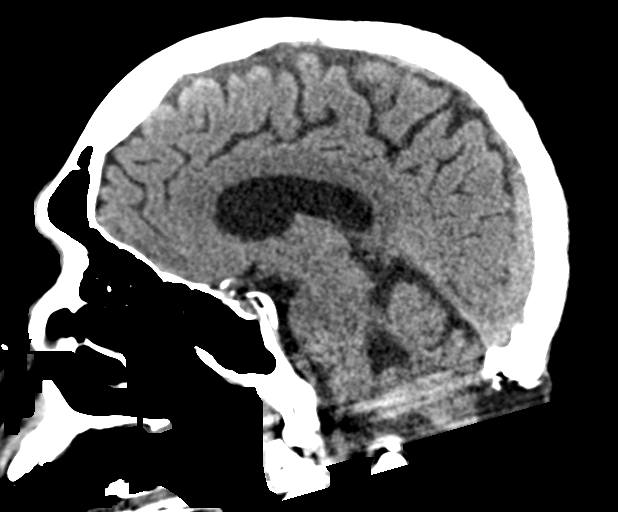
[im 42/63  brain]
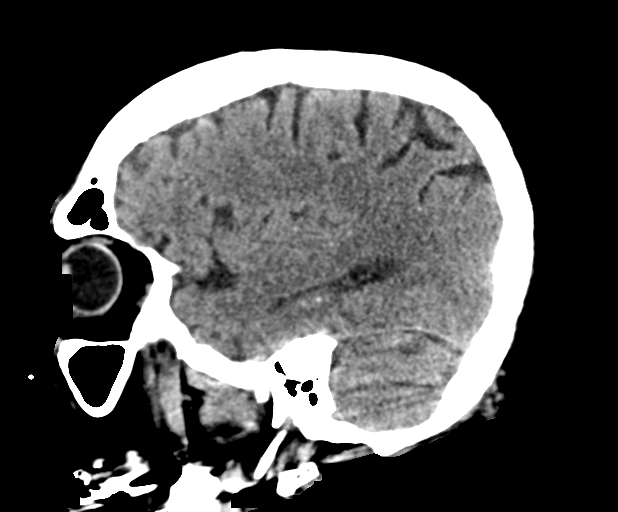

[15 of 47 positions shown; findings below may reference images not displayed]

FINDINGS: Brain: No supratentorial midline shift, ventriculomegaly, mass
effect, evidence of mass lesion, intracranial hemorrhage or evidence
of cortically based acute infarction. Supratentorial gray-white
matter differentiation is stable and within normal limits for age.

Chronic post ischemic and postoperative changes to the posterior
fossa with combined right greater than left cerebellar
encephalomalacia and right side postoperative CSF hygroma combined
with meningocele and partial right cerebellar herniation (series 6,
image 29). Cerebellar mass effect not significantly changed since
[REDACTED].

Vascular: Mild Calcified atherosclerosis at the skull base. No
suspicious intracranial vascular hyperdensity.

Skull: No acute osseous abnormality identified. Previous
suboccipital craniectomy.

Sinuses/Orbits: Visualized paranasal sinuses and mastoids are stable
and well aerated.

Other: Visualized orbit soft tissues are within normal limits. Scalp
soft tissues appear stable, including partially calcified chronic
suboccipital meningocele.
IMPRESSION: 1. No acute intracranial abnormality.
2. Continued stable post ischemic and postoperative changes to the
cerebellum and posterior fossa with right > left cerebellar
encephalomalacia combined with right side CSF hygroma and
cephalocele.

## 2022-01-29 MED ORDER — LIDOCAINE VISCOUS HCL 2 % MT SOLN
OROMUCOSAL | Status: AC
  Filled 2022-01-29: qty 15

## 2022-01-29 MED ORDER — IOHEXOL 300 MG/ML  SOLN
100.0000 mL | Freq: Once | INTRAMUSCULAR | Status: AC | PRN
  Administered 2022-01-29: 7 mL

## 2022-01-29 NOTE — Patient Care Conference (Signed)
Inpatient RehabilitationTeam Conference and Plan of Care Update Date: 01/29/2022   Time: 10:06 AM    Patient Name: [name]      Medical Record Number: [id number]  Date of Birth: [birth year] Sex: Male         Room/Bed: 4W16C/4W16C-01 Payor Info: Payor: MEDICARE / Plan: MEDICARE PART A AND B / Product Type: *No Product type* /    Admit Date/Time:  12/25/2021  3:05 PM  Primary Diagnosis:  Cerebellar infarction [facility])  Hospital Problems: Principal Problem:   Cerebellar infarction [facility]) Active Problems:   PAF (paroxysmal atrial fibrillation) ([place])   Hematuria    Expected Discharge Date: Expected Discharge Date: 02/05/22  Team Members Present: Physician leading conference: Dr. Alger Simons Social Worker Present: [name], [place] Nurse Present: Dorthula Nettles, RN PT Present: Apolinar Junes, PT OT Present: Laverle Hobby, OT SLP Present: Weston Anna, SLP PPS Coordinator present : Gunnar Fusi, SLP     Current Status/Progress Goal Weekly Team Focus  Bowel/Bladder   patient continent most of the time but intermittently incontinet. LBM 2/12  regain continence  timed toileting and prn   Swallow/Nutrition/ Hydration   NPO with PEG, trials with SLP  Min A  Family education   ADL's   Change in cognition/agitation, several falls recently. Worse impulsivity. Transfers min A overall, LB dressing mod A  Supervision to CGA overall  Family education, motor planning, sequencing, d/c planning, ADLs, transfers   Mobility   Min A-CGA at beginning of the week, declined to mod A due to incresed impulsivity and reduced safety awareness with falls with staff this week, notable increase in agitation and confusion the past few days  CGA-min A overall, 2 steps 1 rail  balance, safety awareness, impulse control, gait and stair training, activity tolerance, d/c planning, patient/caregiver education   Communication   Min A  Supervision  Family Education   Safety/Cognition/ Behavioral  Observations  Max A  mod A  Family education   Pain   no issues noted.  remain pain free  assess pain q 4hr and prn   Skin   skin tears and eyebrow laceration  no additional breakdown  assess skin q shift and prn     Discharge Planning:  D/c to home with his wife and son who will provide 24/7 care. Wife works from home and has a flexible schedule in which she is able to still able to provide care to patient. Pt will d/c home with enteral feeds- [employer] [facility] to provide food (Nutren 1.5). Pt will need to d/c to home with 5 days worth of TF as [employer] can not ship out until date of discharge. [facility] for HHPT/OT/SLP/aide/SN (TF edu). DME: w/c, RW, and BSC ordered through [place].   Team Discussion: Possible UTI. CT negative. Plavix and ASA started yesterday. Wife anxious and requesting to speak to the MD. Remains incontinent. New facial laceration/skin tear from 1 of 3 falls last week. Tele-sitter, soft waist belt restraint in place. SW working on setting up tube feeds. Ted hose/abdominal binder in place.   Patient on target to meet rehab goals: yes, supervision/contact guard goals, and contact guard/min assist goals. Becomes agitated at end of sessions. Impulsive, confused, and disoriented. Swallowing not improved. Will go home NPO, on Water Protocol via teaspoon.  *See Care Plan and progress notes for long and short-term goals.   Revisions to Treatment Plan:  Adjusting mediations, UA, C&S ordered.   Teaching Needs: Family educations, medication management, skin/wound care, transfer  training, etc.   Current Barriers to Discharge: Incontinence, Wound care, Behavior, and impulsive, agitation.  Possible Resolutions to Barriers: Family education Family to provide 24/7 care 2 person available 24/7     Medical Summary Current Status: recurrent hematuria, likely uti, worsening MS. still npo  Barriers to Discharge: Behavior;Medical stability   Possible Resolutions to  [employer] Focus: daily assessment of labs and pt data, rx uti, behavioral mod   Continued Need for Acute Rehabilitation Level of Care: The patient requires daily medical management by a physician with specialized training in physical medicine and rehabilitation for the following reasons: Direction of a multidisciplinary physical rehabilitation program to maximize functional independence : Yes Medical management of patient stability for increased activity during participation in an intensive rehabilitation regime.: Yes Analysis of laboratory values and/or radiology reports with any subsequent need for medication adjustment and/or medical intervention. : Yes   I attest that I was present, lead the team conference, and concur with the assessment and plan of the team.   Cristi Loron 01/29/2022, 2:10 PM

## 2022-01-29 NOTE — Progress Notes (Signed)
Occupational Therapy Session Note  Patient Details  Name: [name] MRN: [id number] Date of Birth: 1946-06-03  Today's Date: 01/29/2022 OT Individual Time: 1030-1140 OT Individual Time Calculation (min): 70 min    Short Term Goals: Week 5:  OT Short Term Goal 1 (Week 5): Continue progressing toward established LTG  Skilled Therapeutic Interventions/Progress Updates:    Pt received sitting at the nurses desk with no c/o pain, agreeable to shower to start session. He was still impulsive throughout session but much more manageable and receptive to cueing. He completed an ambulatory transfer to the bathroom with min A, mod for stabilization during turn. He voided urine, partially in brief and the rest in the toilet. He transferred into the shower with min A using the bariatric BSC as a shower chair. He completed bathing seated (teds on during shower), with min A for LB reaching and min cueing for thoroughness. He completed transfer back to the TIS and OT assisted with doffing wet teds and donning new ones. Pt became pale despite stating he was not dizzy. BP assessed and it read 94/67. Tilted his chair back and elevated his legs. It rose to 110/67 following a several minute rest break. He donned pants with mod A and a shirt with min A. Changed wet dressing on peg tube. He was brought to the [facility] where he completed a functional reaching tasks to address UE ataxia, with 70% accuracy on size 9. He then completed a memory task with 3 item sequence and with mod cueing was 75% accurate. He ended with 10 min on the NuStep, completed to provide BUE/LE resistance training and cardiovascular endurance. He returned to his room and was left supine with RN administering tube feed. Bed alarm set and waist restraint on.   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

## 2022-01-29 NOTE — Progress Notes (Signed)
Physical Therapy TBI Note  Patient Details  Name: [name] MRN: [id number] Date of Birth: [birth year]  Today's Date: 01/29/2022 PT Individual Time: 1335-1430 PT Individual Time Calculation (min): 55 min   Short Term Goals: Week 4:  PT Short Term Goal 1 (Week 4): STG=LTG due to ELOS  Skilled Therapeutic Interventions/Progress Updates:     Patient in bed asleep with his SIL at bedside upon PT arrival. Patient slow to arouse to verbal and tactile stimulation and agreeable to PT session. Patient denied pain during session.  Patient's SIL reports improved cognition today with some incite into deficits. Stated he called three friends today and identified his increased slurred speech and decided to rest after the third call. Patient oriented to self, place, and situation, required mod cues and use of the calendar to determine the date. Noted mild slurred speech and continued language of confusion, but improved mentation overall from yesterday. Patient did recall becoming agitated with this provider when donning the soft waist restraint and was resistant to PT placing the restraint initially, but able to redirect and patient compliant with restraint being placed at end of session.   Therapeutic Activity: Bed Mobility: Patient performed supine to/from sit with CGA-supervision with min use of bed rail. Provided verbal cues for safety awareness and forward trunk orientation to reduce retropulsion. Transfers: Patient performed sit to/from stand form the bed and BSC with min A-CGA and mild posterior bias using RW. Provided verbal cues for safety due to impulsivity and forward weight shift to reduce posterior bias. Patient performed an ambulatory transfer to/from the bathroom with min A. Patient was continent of bladder. Performed lower body clothing management with supervision for min cuing for R hand coordination.  Gait Training:  Patient ambulated >200 feet using RW with min A-CGA, decreased assist  with increased repetition. Ambulated with decreased R step height and length, poor safety awareness with use of RW (lifting off the floor x2), and increased forward trunk lean. Provided verbal cues for pushing down into the RW to maintain contact with the floor for safety, slow/controlled stepping to reduce LOB, and erect posture and looking ahead for improved spatial awareness and foot clearance. During gait, patient was looking for his SIL, and required mod cues to redirect to task. Once returned to the room, patient pushed the RW away and ambulated 10 feet to the bed without AD with min A. Patient stood at his bed and straightened his sheets performing forward and lateral reaching with CGA-min A without upper extremity support. He then returned to lying in bed, as above.  Patient in bed due to fatigue at end of session with breaks locked, soft waist belt secured, bed alarm set, and all needs within reach.   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 Agitated Behavior Scale: TBI Observation Details Observation Environment: CIR Start of observation period - Date: 01/29/22 Start of observation period - Time: 1130 End of observation period - Date: 01/29/22 End of observation period - Time: 1145 Agitated Behavior Scale (DO NOT LEAVE BLANKS) Short attention span, easy distractibility, inability to concentrate: Present to a moderate degree Impulsive, impatient, low tolerance for pain or frustration: Present to a moderate degree Uncooperative, resistant to care, demanding: Absent Violent and/or threatening violence toward people or property: Absent Explosive and/or unpredictable  anger: Absent Rocking, rubbing,  moaning, or other self-stimulating behavior: Absent Pulling at tubes, restraints, etc.: Absent Wandering from treatment areas: Absent Restlessness, pacing, excessive movement: Absent Repetitive behaviors, motor, and/or verbal: Absent Rapid, loud, or excessive talking: Absent Sudden changes of mood: Absent Easily initiated or excessive crying and/or laughter: Absent Self-abusiveness, physical and/or verbal: Absent Agitated behavior scale total score: 18    Therapy/Group: Individual Therapy  Sevanna Ballengee L Kastiel Simonian PT, DPT  01/29/2022, 2:46 PM

## 2022-01-29 NOTE — Progress Notes (Signed)
Physical Therapy Weekly Progress Note  Patient Details  Name: Jaimie Redditt MRN: [id number] Date of Birth: 1946/07/29  Beginning of progress report period: January 20, 2022 End of progress report period: January 29, 2022  Today's Date: 01/29/2022  Patient with decline in progress throughout this week. Required Min A-CGA at beginning of the week, declined to mod A due to incresed impulsivity and reduced safety awareness with falls with staff this week, notable increase in agitation and confusion the past few days. Patient now being treated for UTI with some improvement today with continued impulsivity and reduced safety awareness. Family education performed this week, however, patient unsafe during education sessions due to AMS. Plan to extend patient x1 week to allow for treatment of TBI and continue progressing toward CGA-min A goals.  Patient continues to demonstrate the following deficits decreased cardiorespiratoy endurance, impaired timing and sequencing, ataxia, decreased coordination, and decreased motor planning, decreased attention, decreased awareness, decreased problem solving, decreased safety awareness, decreased memory, and delayed processing, and decreased sitting balance, decreased standing balance, decreased postural control, hemiplegia, decreased balance strategies, and difficulty maintaining precautions and therefore will continue to benefit from skilled PT intervention to increase functional independence with mobility.  Patient progressing toward long term goals.   Extended stay due to AMS with UTI.   PT Short Term Goals Week 4:  PT Short Term Goal 1 (Week 4): STG=LTG due to ELOS  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, truncal ataxia Restrictions Weight Bearing Restrictions: No Other Position/Activity Restrictions: Wears a lift in R shoe s/p hip replacement on  L causing leg length discrepency   Therapy/Group: Individual Therapy  Stylianos Stradling L Anaiah Mcmannis PT, DPT  01/29/2022, 10:12 AM

## 2022-01-29 NOTE — Progress Notes (Signed)
Occupational Therapy Session Note  Patient Details  Name: [name] MRN: [id number] Date of Birth: [birth year]  Today's Date: 01/29/2022 OT Individual Time: 8546-2703 OT Individual Time Calculation (min): 41 min    Short Term Goals: Week 3:  OT Short Term Goal 1 (Week 3): Pt will complete LB dressing with min A OT Short Term Goal 1 - Progress (Week 3): Met OT Short Term Goal 2 (Week 3): Pt will complete toilet transfers with min A OT Short Term Goal 2 - Progress (Week 3): Met OT Short Term Goal 3 (Week 3): Pt will require no more than min cueing for sustained attention to one ADL task OT Short Term Goal 3 - Progress (Week 3): Met Week 4:  OT Short Term Goal 1 (Week 4): STG = LTG d/t ELOS OT Short Term Goal 1 - Progress (Week 4): Progressing toward goal Week 5:  OT Short Term Goal 1 (Week 5): Continue progressing toward established LTG  Skilled Therapeutic Interventions/Progress Updates:    Pt greeted at time of session semireclined in bed resting agreeable to OT session, sister in law present who did not remain for session. Removed Posey belt and pt performing bed mobility supervision, performing squat pivot/partial stand pivot to manual wheelchair with Min A toward R side. Needing to toilet, transported to bathroom and stand pivot wheelchair <> BSC over toilet with Min A, pt did have (+) for BM at this time, standing for hygiene and Min A for balance. Set up at sink for hand hygiene. Ataxia noted throughout entire ADL, impulsive and needing multimodal cues for pacing and safety. Transported to ortho gym for time, set up on SCIFIT level 5 for 5 minutes as a BUE warm up and to improve body awareness. Switched to [employer] with large lightweigth ball, 2x20 hits with ataxia limiting but pt able to perform with only dropping ball 1x. Back in room, partial stand pivot with Min A back to bed. Set up with Posey belt in tact, alarm on call bell inr each.   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

## 2022-01-29 NOTE — Progress Notes (Signed)
PROGRESS NOTE     Pt became very agitated again last night. I ordered 1mg  haldol around  5pm. NP ordered CT which was without acute changes. Pt in bed this morning, fairly calm. Doesn't seem to recall events of last night  ROS: Limited due to cognitive/behavioral   Objective:   CT HEAD WO CONTRAST (5MM)  Result Date: 01/29/2022 CLINICAL DATA:  76 year old male with agitation. 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 RADIATION DOSE REDUCTION: This exam was performed according to the departmental dose-optimization program which includes automated exposure control, adjustment of the mA and/or kV according to patient size and/or use of iterative reconstruction technique. COMPARISON:  Head CT 01/26/2022 and earlier. FINDINGS: Brain: No supratentorial midline shift, ventriculomegaly, mass effect, evidence of mass lesion, intracranial hemorrhage or evidence of cortically based acute infarction. Supratentorial gray-white matter differentiation is stable and within normal limits for age. 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 Cerebellar mass effect not significantly changed since December. Vascular: Mild Calcified atherosclerosis at the skull base. No suspicious intracranial vascular hyperdensity. Skull: No acute osseous abnormality identified. Previous suboccipital craniectomy. Sinuses/Orbits: Visualized paranasal sinuses and mastoids are stable and well aerated. Other: Visualized orbit soft tissues are  within normal limits. Scalp soft tissues appear stable, including partially calcified chronic suboccipital meningocele. IMPRESSION: 1. No acute intracranial abnormality. 2. Continued stable post ischemic and postoperative changes to the cerebellum and posterior fossa with right > left cerebellar encephalomalacia combined with right side CSF hygroma and cephalocele. Electronically Signed   By: Genevie Ann M.D.   On: 01/29/2022 06:46   Recent Labs    01/28/22 0506  WBC 5.8  HGB 10.1*  HCT 31.5*  PLT 289      Recent Labs    01/28/22 0506  NA 139  K 3.9  CL 103  CO2 28  GLUCOSE 92  BUN 22  CREATININE 0.86  CALCIUM 8.7*        Intake/Output Summary (Last 24 hours) at 01/29/2022 0034 Last data filed at 01/28/2022 2300 Gross per 24 hour  Intake 0 ml  Output 550 ml  Net -550 ml               Physical Exam: Vital Signs Blood pressure 124/68, pulse 71, temperature 98.4 F (36.9 C), temperature source Oral, resp. rate 14, height 5\' 10"  (1.778 m), weight 84.1 kg, SpO2 96 %.    Constitutional: No distress . Vital signs reviewed. HEENT: NCAT, EOMI, oral membranes moist Neck: supple Cardiovascular: RRR without murmur. No JVD    Respiratory/Chest: CTA Bilaterally without wheezes or rales. Normal effort    GI/Abdomen: BS +, non-tender, non-distended, foley in stomach, area clean Ext: no clubbing, cyanosis, or edema Psych: slowed, coooperative Neurologic: oriented to person, not place consistently.  Cranial nerves II through XII intact, motor strength is 5/5 in bilateral  deltoid, bicep, tricep, grip, hip flexor, knee extensors, ankle dorsiflexor and plantar flexor GU; urine amber colored Skin: No evidence of breakdown, no evidence of rash.  Lac over right eye brow Neuro:  .alert, oriented to person, [place], [place],   can be perseverative, follows basic commands. No resting tone. Sensory exam grossly intact.     Musculoskeletal: no jt pain   Assessment/Plan: 1.  Functional deficits which require 3+ hours per day of interdisciplinary therapy in a comprehensive inpatient rehab setting. Physiatrist is providing close team supervision and 24 hour management of active medical problems listed below. Physiatrist and rehab team continue to assess barriers to discharge/monitor patient progress toward functional and medical goals  Care Tool:  Bathing    Body parts bathed by patient: Right arm, Left arm, Right lower leg, Left lower leg, Face, Chest, Abdomen, Front perineal area, Buttocks, Right upper leg, Left upper leg   Body parts bathed by helper: Right arm, Left arm, Chest, Abdomen     Bathing assist Assist Level: Minimal Assistance - Patient > 75%     Upper Body Dressing/Undressing Upper body dressing   What is the patient wearing?: Pull over shirt    Upper body assist Assist Level: Supervision/Verbal cueing    Lower Body Dressing/Undressing Lower body dressing      What is the patient wearing?: Pants, Incontinence brief     Lower body assist Assist for lower body dressing: Moderate Assistance - Patient 50 - 74%     Toileting Toileting    Toileting assist Assist for toileting: Moderate Assistance - Patient 50 - 74%     Transfers Chair/bed transfer  Transfers assist     Chair/bed transfer assist level: Minimal Assistance - Patient > 75% Chair/bed transfer assistive device: Programmer, multimedia   Ambulation assist   Ambulation activity did not occur: Safety/medical concerns  Assist level: Moderate Assistance - Patient 50 - 74% Assistive device: Walker-rolling Max distance: >150 ft   Walk 10 feet activity   Assist  Walk 10 feet activity did not occur: Safety/medical concerns  Assist level: Moderate Assistance - Patient - 50 - 74% Assistive device: Walker-rolling   Walk 50 feet activity   Assist Walk 50 feet with 2 turns activity did not occur: Safety/medical concerns  Assist level: Moderate Assistance  - Patient - 50 - 74% Assistive device: Walker-rolling    Walk 150 feet activity   Assist Walk 150 feet activity did not occur: Safety/medical concerns  Assist level: Moderate Assistance - Patient - 50 - 74% Assistive device: Walker-rolling    Walk 10 feet on uneven surface  activity   Assist Walk 10 feet on uneven surfaces activity did not occur: Safety/medical concerns         Wheelchair     Assist Is the patient using a wheelchair?: Yes Type of Wheelchair: Manual Wheelchair activity did not occur: Safety/medical concerns (limited by cognitive deficits)  Wheelchair assist level: Supervision/Verbal cueing Max wheelchair distance: 57 ft    Wheelchair 50 feet with 2 turns activity    Assist    Wheelchair 50 feet with 2 turns activity did not occur: Safety/medical concerns   Assist Level: Supervision/Verbal cueing   Wheelchair 150 feet activity     Assist  Wheelchair 150 feet activity did not occur: Safety/medical concerns       Blood pressure 124/68, pulse 71, temperature 98.4 F (36.9 C), temperature source Oral, resp. rate 14, height 5\' 10"  (1.778 m), weight 84.1 kg, SpO2 96 %.  Medical Problem List and Plan: 1. 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  Status post posterior decompression with craniectomy 11/26/2021             -patient may not yet shower             -ELOS/Goals:   2/15---may need to push back.  supervision to min assist goals   - discuss discharge/dispo with team tomorrow in conference  -Continue CIR therapies including PT, OT, and SLP. Interdisciplinary team conference today to discuss goals, barriers to discharge, and dc planning.    2.  Antithrombotics: -DVT/anticoagulation:  Pharmaceutical: Other (comment) Eliquis             -antiplatelet therapy: N/A 3. Pain Management: Tizanidine no longer ordered, Voltaren gel 4 times daily, Fioricet  as needed, oxycodone as needed 4. Mood/sleep/behavior:                 -antipsychotic agents: seroquel               - sleep chart    - continue seroquel 50 mg qam and 150mg  qhs  -continue HS celexa for sleep and mood stabilization  -rx'ed UTI 2/14 --sudden increase in agitation -suspect this is from UTI -empiric keflex started, await ucx results -back in [place] for fall risk  -increased celexa to 20mg  qhs  -restraints have made him more agitated before 5. Neuropsych: This patient is not capable of making decisions on his own behalf.  - telesitter ongoing-  6. Skin/Wound Care: Routine skin checks 7. Fluids/Electrolytes/Nutrition:   Continue foley for PEG as long as it continues to function. If he needs it long term will have balloon bumper feeding tube inserted  - on bolus feeds   - bolus schedule was adjusted, RD added further volume to TF  -have requested replacement of foley with appropriate feeding tube per IR.   8.  Prolonged ventilatory support.  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     Decannulated 1/16- stoma closed 9.  Dysphagia.  Status post gastrostomy tube 12/13/2021 per Dr.Lovick-  -continue NPO, trials with SLP only. 10.  Aspiration pneumonia.  Antibiotic therapy completed.  -afebrile  -oob, oxygenation improved 11.  New onset atrial fibrillation.  Started Eliquis 12/17/2021.  Continue low-dose beta-blocker.    HR controlled  -appreciate cardiology consult. Discussed case with Dr. Gardiner Rhyme today.    2/14 urine amber colored   -low dose eliquis stopped 2/9   -discussed with cards   - asa/plavix initiated 2/13  -potential Watchman LA appendage closure device placement? Vitals:   01/28/22 0430 01/29/22 0540  BP: (!) 116/59 124/68  Pulse: 64 71  Resp: 16 14  Temp: 99.1 F (37.3 C) 98.4 F (36.9 C)  SpO2: 95% 96%    12.  Hypertension.  Continue Norvasc 10 mg daily, Cozaar 50 mg daily, , clonidine 0.1 mg every 8 hours.  ,  2/3-2/4 BP controlled.  Clonidine d/ced 2/3  2/10 don't believe this morning's bp is correct.   2/14 bp better off norvasc, increase H2O through PEG 13.  Hyperlipidemia.  Lipitor 14.  History of BPH/urinary retention.        -generally has been voiding but is incontinent 15.  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  Follow-up per cardiology services. 16. 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 Has extensive hx per wife/son:     eliquis resumed at low dose 2/3 - 100k E  coli UTI was rx'ed with keflex  -hgb improved to 11.7 2/6  -urology has seen, ?finasteride if can take po  -2/14 Hgb down to 10.1 2/13, urine amber   -f/u cbc tomorrow 17. Dry/Azotemia/low BP  2/13 H2O flushes increased this weekend. (250cc q4)   -recheck 2/15 18. Fall  2/14 both CT's negative for acute changes        LOS: 35 days A FACE TO FACE EVALUATION WAS PERFORMED  Meredith Staggers 01/29/2022, 9:09 AM

## 2022-01-29 NOTE — Progress Notes (Signed)
Patient transported via stretcher for CT scan.
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
## 2022-01-29 NOTE — Progress Notes (Signed)
Speech Language Pathology TBI Note  Patient Details  Name: [name] MRN: [id number] Date of Birth: 1946-10-23  Today's Date: 01/29/2022 SLP Individual Time: 0855-0950 SLP Individual Time Calculation (min): 55 min  Short Term Goals: Week 5: SLP Short Term Goal 1 (Week 5): STGs=LTGs due to ELOS  Skilled Therapeutic Interventions: Skilled treatment session focused on cognitive and dysphagia goals. Upon arrival, patient was awake in bed and agreeable to ST session. Patient assisted in donning his pants while supine in bed by bridging his hips and pulling up each side. Patient sat EOB from supine with Min A and reported feeling dizzy. BP taken and WNL. Patient transferred to the wheelchair with Min A with +2 for safety. SLP provided set-up assist with the suction toothbrush and patient completed oral care. Patient consumed trials of ice chips with consistent overt s/s of aspiration and Mod verbal cues for use of swallowing compensatory strategies. Recommend ongoing trials with SLP. Patient oriented to place but required Min verbal cues for orientation to time and situation. Patient with decreased confusion today but continues with intermittent phonemic paraphasis. Patient received a card and was able to read the card aloud but demonstrated difficulty naming familiar family members in a picture that was provided. Patient left upright in tilt-in-space wheelchair with soft waist belt in place at RN station. Continue with current plan of care.      Pain No/Denies Pain   Agitated Behavior Scale: TBI Observation Details Observation Environment: CIR Start of observation period - Date: 01/29/22 Start of observation period - Time: 0855 End of observation period - Date: 01/29/22 End of observation period - Time: 0950 Agitated Behavior Scale (DO NOT LEAVE BLANKS) Short attention span, easy distractibility, inability to concentrate: Present to a moderate degree Impulsive, impatient, low tolerance for  pain or frustration: Present to a slight degree Uncooperative, resistant to care, demanding: Absent Violent and/or threatening violence toward people or property: Absent Explosive and/or unpredictable anger: Absent Rocking, rubbing, moaning, or other self-stimulating behavior: Absent Pulling at tubes, restraints, etc.: Absent Wandering from treatment areas: Absent Restlessness, pacing, excessive movement: Absent Repetitive behaviors, motor, and/or verbal: Absent Rapid, loud, or excessive talking: Present to a slight degree Sudden changes of mood: Absent Easily initiated or excessive crying and/or laughter: Absent Self-abusiveness, physical and/or verbal: Absent Agitated behavior scale total score: 18  Therapy/Group: Individual Therapy  Tandra Rosado 01/29/2022, 10:04 AM

## 2022-01-29 NOTE — Progress Notes (Signed)
Patient ID: [name], male   DOB: 1946/10/08, 76 y.o.   MRN: [id number]  SW met with pt and pt SIL in room to inform on change in d/c date. SW informed will follow-up with pt wife to discuss.   1455-SW spoke with [name] to inform on change in d/c date from 2/15 to 2/21. Reports she spoke with Dr. Naaman Plummer already as well. States she has heard from [name] yesterday about delivery for TF.SW informed will provide updates if there are any changes.   SW updated [name]/[facility] on change in pt d/c date.  SW spoke with [name]/[facility] enteral feed dept [id number] option #5, ext [phone]/f: [phone]) to inform on change in d/c date. Reports items have shipped and should arrive by Friday.   [name], MSW, [place] Office: [phone] Cell: [phone] Fax: [phone]

## 2022-01-30 LAB — BASIC METABOLIC PANEL
Anion gap: 7 (ref 5–15)
BUN: 17 mg/dL (ref 8–23)
CO2: 29 mmol/L (ref 22–32)
Calcium: 8.6 mg/dL — ABNORMAL LOW (ref 8.9–10.3)
Chloride: 104 mmol/L (ref 98–111)
Creatinine, Ser: 0.8 mg/dL (ref 0.61–1.24)
GFR, Estimated: >60 mL/min (ref >60)
Glucose, Bld: 103 mg/dL — ABNORMAL HIGH (ref 70–99)
Potassium: 4 mmol/L (ref 3.5–5.1)
Sodium: 140 mmol/L (ref 135–145)

## 2022-01-30 LAB — CBC
HCT: 31.7 % — ABNORMAL LOW (ref 39.0–52.0)
Hemoglobin: 10.4 g/dL — ABNORMAL LOW (ref 13.0–17.0)
MCH: 30.5 pg (ref 26.0–34.0)
MCHC: 32.8 g/dL (ref 30.0–36.0)
MCV: 93 fL (ref 80.0–100.0)
Platelets: 283 10*3/uL (ref 150–400)
RBC: 3.41 MIL/uL — ABNORMAL LOW (ref 4.22–5.81)
RDW: 15.7 % — ABNORMAL HIGH (ref 11.5–15.5)
WBC: 4.7 10*3/uL (ref 4.0–10.5)
nRBC: 0 % (ref 0.0–0.2)

## 2022-01-30 MED ORDER — QUETIAPINE FUMARATE 50 MG PO TABS
50.0000 mg | ORAL_TABLET | Freq: Every day | ORAL | Status: DC
  Administered 2022-01-30: 50 mg
  Filled 2022-01-30: qty 1

## 2022-01-30 NOTE — Progress Notes (Signed)
Occupational Therapy Weekly Progress Note  Patient Details  Name: [name] MRN: [id number] Date of Birth: December 28, 1945  Beginning of progress report period: 01/23/22 End of progress report period: 01/30/22  Today's Date: 01/30/2022 OT Individual Time: 1140-1205 OT Individual Time Calculation (min): 25 min   [name] has had slow progress this week d/t a recurrent UTI that has led to behavior and cognitive decline. He requires min-mod A overall for ADLs and transfers. His wife and son are very involved and have attended several family education sessions. He is on track for discharge next week pending medical stability.   Patient continues to demonstrate the following deficits: muscle weakness, decreased cardiorespiratoy endurance, ataxia, decreased coordination, and decreased motor planning, decreased initiation, decreased attention, decreased awareness, decreased problem solving, decreased safety awareness, decreased memory, and delayed processing, and decreased sitting balance, decreased standing balance, decreased postural control, and decreased balance strategies and therefore will continue to benefit from skilled OT intervention to enhance overall performance with BADL and Reduce care partner burden.  Patient progressing toward long term goals..  Continue plan of care.  OT Short Term Goals Week 5:  OT Short Term Goal 1 (Week 5): Continue progressing toward established LTG OT Short Term Goal 1 - Progress (Week 5): Progressing toward goal Week 6:  OT Short Term Goal 1 (Week 6): Continued progressing toward established LTG  Skilled Therapeutic Interventions/Progress Updates:    Pt received sitting at the nurses desk with no c/o pain, agreeable to OT session. Pt was taken to the BITS where he worked on functional reaching for the ataxia and visual scanning. Mod cueing for cognitive component of sequencing numbers and letters, 10-15 items in a row. Pt completed memory sequence tasks with 2 item  sequence with 95% accuracy with min verbal cueing. Pt requested to return to his room and use the bathroom. Pt completed ambulatory transfer into the bathroom with min A using the RW. Pt completed toileting tasks with mod A overall. Pt was left supine with all needs met, bed alarm set. Posey belt on.   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

## 2022-01-30 NOTE — Progress Notes (Signed)
PROGRESS NOTE     Pt again became agitated last night and physically aggressive. Up in w/c at nurses station this morning.   ROS: Limited due to cognitive/behavioral   Objective:   CT HEAD WO CONTRAST (5MM)  Result Date: 01/29/2022 CLINICAL DATA:  76 year old male with agitation. 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 RADIATION DOSE REDUCTION: This exam was performed according to the departmental dose-optimization program which includes automated exposure control, adjustment of the mA and/or kV according to patient size and/or use of iterative reconstruction technique. COMPARISON:  Head CT 01/26/2022 and earlier. FINDINGS: Brain: No supratentorial midline shift, ventriculomegaly, mass effect, evidence of mass lesion, intracranial hemorrhage or evidence of cortically based acute infarction. Supratentorial gray-white matter differentiation is stable and within normal limits for age. 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 Cerebellar mass effect not significantly changed since December. Vascular: Mild Calcified atherosclerosis at the skull base. No suspicious intracranial vascular hyperdensity. Skull: No acute osseous abnormality identified. Previous suboccipital craniectomy. Sinuses/Orbits: Visualized paranasal sinuses and mastoids are stable and well aerated. Other: Visualized orbit soft tissues are within normal limits. Scalp soft tissues appear stable, including partially calcified chronic  suboccipital meningocele. IMPRESSION: 1. No acute intracranial abnormality. 2. Continued stable post ischemic and postoperative changes to the cerebellum and posterior fossa with right > left cerebellar encephalomalacia combined with right side CSF hygroma and cephalocele. Electronically Signed   By: Genevie Ann M.D.   On: 01/29/2022 06:46   IR Replc Gastro/Colonic Tube Percut W/Fluoro  Result Date: 01/29/2022 INDICATION: Patient with history of dysphagia. Request is for gastrostomy tube replacement. EXAM: GASTROSTOMY TUBE EXCHANGE UNDER FLUOROSCOPY COMPARISON:  None MEDICATIONS: No; Antibiotics were administered within 1 hour of the procedure. CONTRAST:  7 mL of Isovue 300 administered into the gastric lumen. ANESTHESIA/SEDATION: Moderate (conscious) sedation was not employed during this procedure FLUOROSCOPY TIME:  0 minutes 6 seconds (1 mGy) COMPLICATIONS: None immediate. PROCEDURE: Informed written consent was obtained from the patient and/or patient's representative following explanation of the procedure, risks, benefits and alternatives. A time out was performed prior to the initiation of the procedure. Ultrasound scanning was performed to demarcate the edge of the left lobe of the liver. Maximal barrier sterile technique utilized including caps, mask, sterile gowns, sterile gloves, large sterile drape, hand hygiene and Betadine prep. The right upper quadrant was sterilely prepped and draped. Under intermittent fluoroscopic guidance, the place holder catheter was exchanged for a Amplatz, ultimately allowing placement of a 22-French balloon retention gastrostomy tube. The retention balloon was insufflated with a mixture of dilute saline and pulled taut against the anterior wall of the stomach. The external disc was cinched. Contrast injection confirms positioning within the stomach. Spot radiographic images confirmed placement within the stomach. The patient tolerated procedure well without immediate post  procedural complication. FINDINGS: After successful fluoroscopic guided placement,  the gastrostomy tube is appropriately positioned with internal retention balloon against the ventral aspect of the gastric lumen. IMPRESSION: Successful fluoroscopic exchange for a 22 Fr balloon retention gastrostomy tube. The gastrostomy tube is available to use at this time. Read by: Rushie Nyhan, NP PLAN: The patient should return to interventional radiology for routine gastrostomy catheter exchange in 6 months. Electronically Signed   By: Michaelle Birks M.D.   On: 01/29/2022 13:11   Recent Labs    01/28/22 0506 01/30/22 0506  WBC 5.8 4.7  HGB 10.1* 10.4*  HCT 31.5* 31.7*  PLT 289 283      Recent Labs    01/28/22 0506 01/30/22 0506  NA 139 140  K 3.9 4.0  CL 103 104  CO2 28 29  GLUCOSE 92 103*  BUN 22 17  CREATININE 0.86 0.80  CALCIUM 8.7* 8.6*        Intake/Output Summary (Last 24 hours) at 01/30/2022 0848 Last data filed at 01/29/2022 1819 Gross per 24 hour  Intake --  Output 100 ml  Net -100 ml               Physical Exam: Vital Signs Blood pressure (!) 129/58, pulse 68, temperature 98.5 F (36.9 C), temperature source Oral, resp. rate 14, height 5\' 10"  (1.778 m), weight 88.3 kg, SpO2 93 %.   Constitutional: No distress . Vital signs reviewed. HEENT: NCAT, EOMI, oral membranes moist Neck: supple Cardiovascular: RRR without murmur. No JVD    Respiratory/Chest: CTA Bilaterally without wheezes or rales. Normal effort    GI/Abdomen: BS +, non-tender, non-distended Ext: no clubbing, cyanosis, or edema Psych: distracted, confused  Neurologic: oriented to person, distracted/perseverative..  Cranial nerves II through XII intact, motor strength is 5/5 in bilateral deltoid, bicep, tricep, grip, hip flexor, knee extensors, ankle dorsiflexor and plantar flexor GU; urine amber colored Skin: No evidence of breakdown, no evidence of rash.  Lac over right eye brow Neuro:   .alert, oriented to person, [place], [place],   can be perseverative, follows basic commands. No resting tone. Sensory exam grossly intact.     Musculoskeletal: no jt pain   Assessment/Plan: 1. Functional deficits which require 3+ hours per day of interdisciplinary therapy in a comprehensive inpatient rehab setting. Physiatrist is providing close team supervision and 24 hour management of active medical problems listed below. Physiatrist and rehab team continue to assess barriers to discharge/monitor patient progress toward functional and medical goals  Care Tool:  Bathing    Body parts bathed by patient: Right arm, Left arm, Right lower leg, Left lower leg, Face, Chest, Abdomen, Front perineal area, Buttocks, Right upper leg, Left upper leg   Body parts bathed by helper: Right arm, Left arm, Chest, Abdomen     Bathing assist Assist Level: Minimal Assistance - Patient > 75%     Upper Body Dressing/Undressing Upper body dressing   What is the patient wearing?: Pull over shirt    Upper body assist Assist Level: Supervision/Verbal cueing    Lower Body Dressing/Undressing Lower body dressing      What is the patient wearing?: Pants, Incontinence brief     Lower body assist Assist for lower body dressing: Moderate Assistance - Patient 50 - 74%     Toileting Toileting    Toileting assist Assist for toileting: Moderate Assistance - Patient 50 - 74%     Transfers Chair/bed transfer  Transfers assist     Chair/bed transfer assist level: Minimal Assistance - Patient > 75% Chair/bed  transfer assistive device: Museum/gallery exhibitions officer assist   Ambulation activity did not occur: Safety/medical concerns  Assist level: Minimal Assistance - Patient > 75% Assistive device: Walker-rolling Max distance: 200 ft   Walk 10 feet activity   Assist  Walk 10 feet activity did not occur: Safety/medical concerns  Assist level: Minimal Assistance -  Patient > 75% Assistive device: Walker-rolling   Walk 50 feet activity   Assist Walk 50 feet with 2 turns activity did not occur: Safety/medical concerns  Assist level: Minimal Assistance - Patient > 75% Assistive device: Walker-rolling    Walk 150 feet activity   Assist Walk 150 feet activity did not occur: Safety/medical concerns  Assist level: Minimal Assistance - Patient > 75% Assistive device: Walker-rolling    Walk 10 feet on uneven surface  activity   Assist Walk 10 feet on uneven surfaces activity did not occur: Safety/medical concerns         Wheelchair     Assist Is the patient using a wheelchair?: Yes Type of Wheelchair: Manual Wheelchair activity did not occur: Safety/medical concerns (limited by cognitive deficits)  Wheelchair assist level: Supervision/Verbal cueing Max wheelchair distance: 57 ft    Wheelchair 50 feet with 2 turns activity    Assist    Wheelchair 50 feet with 2 turns activity did not occur: Safety/medical concerns   Assist Level: Supervision/Verbal cueing   Wheelchair 150 feet activity     Assist  Wheelchair 150 feet activity did not occur: Safety/medical concerns       Blood pressure (!) 129/58, pulse 68, temperature 98.5 F (36.9 C), temperature source Oral, resp. rate 14, height 5\' 10"  (1.778 m), weight 88.3 kg, SpO2 93 %.    Medical Problem List and Plan: 1. 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  Status post posterior decompression with craniectomy 11/26/2021             -patient may not yet shower             -ELOS/Goals:   2/15---may need to push back.  supervision to min assist goals   - discuss discharge/dispo with team tomorrow in conference  -Continue CIR therapies including PT, OT, and SLP  2.  Antithrombotics: -DVT/anticoagulation:  Pharmaceutical: Other (comment) Eliquis             -antiplatelet therapy:  N/A 3. Pain Management: Tizanidine no longer ordered, Voltaren gel 4 times daily, Fioricet as needed, oxycodone as needed 4. Mood/sleep/behavior:                 -antipsychotic agents: seroquel               - sleep chart    - continue seroquel 50 mg qam and 150mg  qhs  -continue HS celexa for sleep and mood stabilization  -rx'ed UTI 2/15 --ongoing HS agitation -suspect this is from UTI -empiric keflex started, await ucx results -back in posey for fall risk  -increased celexa to 20mg  qhs  -restraints have made him more agitated before  -will add 5pm dose of seroquel 50mg  for now 5. Neuropsych: This patient is not capable of making decisions on his own behalf.  - telesitter ongoing-  6. Skin/Wound Care: Routine skin checks 7. Fluids/Electrolytes/Nutrition:   Continue foley for PEG as long as it continues to function. If he needs it long term will have balloon bumper feeding tube inserted  - on bolus feeds   -  bolus schedule was adjusted, RD added further volume to TF  -have requested replacement of foley with appropriate feeding tube per IR.   8.  Prolonged ventilatory support.  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     Decannulated 1/16- stoma closed 9.  Dysphagia.  Status post gastrostomy tube 12/13/2021 per Dr.Lovick-  -continue NPO, trials with SLP only. 10.  Aspiration pneumonia.  Antibiotic therapy completed.  -afebrile  -oob, oxygenation improved 11.  New onset atrial fibrillation.  Started Eliquis 12/17/2021.  Continue low-dose beta-blocker.    HR controlled  -appreciate cardiology consult. Discussed case with Dr. Gardiner Rhyme today.    2/15 urine amber colored   -low dose eliquis stopped 2/9   -discussed with cards   - asa/plavix initiated 2/13  -potential Watchman LA appendage closure device placement Vitals:   01/29/22 1426 01/29/22 2020  BP: (!) 122/58 (!) 129/58  Pulse: 67 68  Resp: 18 14  Temp: 97.6 F (36.4 C) 98.5 F (36.9 C)  SpO2: 97% 93%    12.   Hypertension.  Continue Norvasc 10 mg daily, Cozaar 50 mg daily, , clonidine 0.1 mg every 8 hours.  ,  2/3-2/4 BP controlled. Clonidine d/ced 2/3  2/10 don't believe this morning's bp is correct.   2/15 bp better off norvasc, increase H2O through PEG 13.  Hyperlipidemia.  Lipitor 14.  History of BPH/urinary retention.        -generally has been voiding but is incontinent 15.  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  Follow-up per cardiology services. 16. 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 Has extensive hx per wife/son:     eliquis resumed at low dose 2/3 - 100k E coli UTI was rx'ed with keflex  -hgb improved to 11.7 2/6  -urology has seen, ?finasteride if can take po  -2/15 Hgb u[p to 10.4 today,  urine amber  -ucx 100k GNR, continue keflex empirically     18. Fall  2/14 both CT's negative for acute changes        LOS: 36 days A FACE TO FACE EVALUATION WAS PERFORMED  Meredith Staggers 01/30/2022, 8:48 AM

## 2022-01-30 NOTE — Progress Notes (Signed)
Occupational Therapy Session Note  Patient Details  Name: [name] MRN: [id number] Date of Birth: 04-06-1946  Today's Date: 01/30/2022 OT Individual Time: 1400-1500 OT Individual Time Calculation (min): 60 min    Short Term Goals: Week 6:  OT Short Term Goal 1 (Week 6): Continued progressing toward established LTG  Skilled Therapeutic Interventions/Progress Updates:    Pt received supine with blood all over him and his bed- from penis. Pt calm and redirectable to task, waiting for OT to assist him. He completed bed mobility with increased posterior lean/retropulsion initally but able to correct with cueing to slow down transfers. Ambulatory transfers throughout session with min A using the RW, fast pace and impulsive ataxic movements. Pt completed UB bathing at the sink with CGA, including doffing and donning a shirt. He required mod A for peri hygiene in standing and had frequent loss of blood from his penis, especially when standing. Mod A to don new pants and brief. He was taken to the therapy gym where he completed BUE coordination activity- throwing and catching a ball at a small trampoline. Pt with severe UE ataxia and found this very difficult. Added arm weights with slight improvement. He then completed a brief UE circuit with a 4lb dowel- reaching forward x10 before reporting he needed to urinate again. He was taken back to his room with similar performance with toileting tasks. His wife terri arrived and he was left sitting up with all needs met, posey belt on.   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    Therapy/Group: Individual Therapy  [name] 01/30/2022, 2:25 PM

## 2022-01-30 NOTE — Progress Notes (Signed)
Speech Language Pathology Weekly Progress and Session Note  Patient Details  Name: [name] MRN: [id number] Date of Birth: Aug 26, 1946  Beginning of progress report period: January 23, 2022 End of progress report period: January 30, 2022  Today's Date: 01/30/2022 SLP Individual Time: 1015-1055 SLP Individual Time Calculation (min): 40 min  Short Term Goals: Week 5: SLP Short Term Goal 1 (Week 5): STGs=LTGs due to ELOS SLP Short Term Goal 1 - Progress (Week 5): Not met    New Short Term Goals: Week 6: SLP Short Term Goal 1 (Week 6): STGs=LTGs due to ELOS  Weekly Progress Updates: Patient continues to make slow but functional gains. Patient's discharge date was extended due to medical issues impacting patient's overall cognitive functioning and safety. Currently, patient demonstrates increased confusion, increased impulsivity and increased agitation with intermittent combativeness which occurs mostly in the evenings and can be difficult to redirect. Patient also demonstrates increased overt s/s of aspiration with trials of thin liquids with decreased ability to follow commands for use of swallowing compensatory strategies.  Patient remains NPO with alternative means of nutrition. Patient and family education ongoing. Patient would benefit from continued skilled SLP intervention to maximize his cognitive and swallowing function prior to discharge.    Intensity: Minumum of 1-2 x/day, 30 to 90 minutes Frequency: 3 to 5 out of 7 days Duration/Length of Stay: 02/05/22 Treatment/Interventions: Cognitive remediation/compensation;Internal/external aids;Speech/Language facilitation;Therapeutic Activities;Environmental controls;Cueing hierarchy;Functional tasks;Patient/family education;Dysphagia/aspiration precaution training   Daily Session  Skilled Therapeutic Interventions: Skilled treatment session focused on dysphagia and cognitive goals. SLP facilitated session by providing set-up assist  for patient to perform oral care via the suction toothbrush.  Patient consumed trials of ice chips with Min verbal cues for use of swallowing compensatory strategies but continues to demonstrate increased overt s/s of aspiration which occurred in 50% of trials. Patient with decreased generalized confusion but continues to demonstrate language of confusion throughout session.  Patient with intermittent awareness of cognitive deficits which improves emergent awareness intermittently with overall mod verbal cues. Patient also with increased recall of daily information, especially as it relates to his agitation. SLP provided Mod verbal cues for problem solving regarding maximizing safety and de-escalating unsafe situations, specially when frustrated. Patient left upright at RN station with soft waist belt in place. Continue with current plan of care.     Pain No/Denies Pain   Therapy/Group: Individual Therapy  [name] 01/30/2022, 6:43 AM

## 2022-01-30 NOTE — Progress Notes (Signed)
Physical Therapy Session Note  Patient Details  Name: [name] MRN: [id number] Date of Birth: 04-Oct-1946  Today's Date: 01/30/2022 PT Individual Time: 0802-0925 PT Individual Time Calculation (min): 83 min   Short Term Goals: Week 5:  PT Short Term Goal 1 (Week 5): STG=LTG due to ELOS  Skilled Therapeutic Interventions/Progress Updates:     Patient in w/c with NT upon PT arrival. Patient alert and agreeable to PT session. Patient denied pain during session.  Patient able to recall events from last night and this morning. His report matched the nurses report in the chart of events leading to security being called. He stated that initially he was incontinent of urin and used the call bell to call for assist several times, without response, unsure of accurate awareness of time, however having to wait made him angry and it escalated from there. Patient oriented to location and situation, education on present UTI and AMS along with cognitive deficits from CVA leading to impulse control and poor emotional regulation.   Discussed with patient and RN during session about one-on-one supervision for improved time to toileting due to frequency/urgency and for behavior management. Plan for patient to stay at the nurses station between therapy sessions today. Will discuss updating behavior plan with rehab team.  Therapeutic Activity: Transfers: Patient performed sit to/from stand x2 and stand pivot w/c>TIS w/c with CGA-min A using RW. Provided verbal cues for slow and controlled movements and forward weight shift to stand to reduce posterior bias. Patient was continent and incontinent of bladder and bowl during toileting. Performed peri-care with max A, as patient performed anterior peri-care.   Gait Training:  Patient ambulated >50 feet x2 using RW with min A. Ambulated with decreased R step height and length, poor safety awareness with use of RW (lifting off the floor x1), and increased forward  trunk lean. Provided verbal cues for pushing down into the RW to maintain contact with the floor for safety, slow/controlled stepping to reduce LOB, and erect posture and looking ahead for improved spatial awareness and foot clearance.  Neuromuscular Re-ed: Patient performed initiated the Berg Balance Test and patient completed 7/14 items before losing attention to the task: Berg Balance Test Sit to Stand: Able to stand using hands after several tries Standing Unsupported: Able to stand 2 minutes with supervision Sitting with Back Unsupported but Feet Supported on Floor or Stool: Able to sit 2 minutes under supervision Stand to Sit: Controls descent by using hands Transfers: Needs one person to assist Standing Unsupported with Eyes Closed: Able to stand 10 seconds with supervision Standing Ubsupported with Feet Together: Needs help to attain position but able to stand for 30 seconds with feet together From Standing, Reach Forward with Outstretched Arm: Loses balance while trying/requires external support From Standing Position, Pick up Object from Floor: Unable to try/needs assist to keep balance From Standing Position, Turn to Look Behind Over each Shoulder: Needs assist to keep from losing balance and falling Turn 360 Degrees: Needs assistance while turning Standing Unsupported, Alternately Place Feet on Step/Stool: Needs assistance to keep from falling or unable to try Standing Unsupported, One Foot in Front: Loses balance while stepping or standing Standing on One Leg: Unable to try or needs assist to prevent fall Total Score: 16/56 (16/28 for scored items) Items with 0 score were not performed. Patient demonstrates increased fall risk as noted by score of 16/56 on Berg Balance Scale.  (<36= high risk for falls, close to 100%; 37-45 significant >80%; 46-51  moderate >50%; 52-55 lower >25%) Based on decreased attention to balance assessment, patient continues to be at high risk for falls with  inattention and decreased safety awareness.   Patient agreeable to having soft waist belt placed and secured to TIS w/c and to sitting at the nurses station for one-on-one supervision. Provided patient with a news paper and activity handouts and markers and patient appreciative for items to provide him with something to do.   Patient in [place] w/c at the nurses station handed off to nurse secretary with soft waist restraint secured at end of session with breaks locked, and all needs within reach.   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    Therapy/Group: Individual Therapy  [name] L [name] PT, DPT  01/30/2022, 12:15 PM

## 2022-01-31 ENCOUNTER — Inpatient Hospital Stay (HOSPITAL_COMMUNITY): Payer: Medicare Other

## 2022-01-31 DIAGNOSIS — D62 Acute posthemorrhagic anemia: Secondary | ICD-10-CM

## 2022-01-31 DIAGNOSIS — N390 Urinary tract infection, site not specified: Secondary | ICD-10-CM

## 2022-01-31 DIAGNOSIS — R310 Gross hematuria: Secondary | ICD-10-CM

## 2022-01-31 DIAGNOSIS — A499 Bacterial infection, unspecified: Secondary | ICD-10-CM

## 2022-01-31 LAB — URINE CULTURE: Culture: >=100000 — AB

## 2022-01-31 IMAGING — CT CT HEAD W/O CM
3 of 5 series · 14 of 47 positions shown, 16 images · non-contrast
Comparison: [DATE]

CLINICAL DATA: Mental status change. History of bilateral
cerebellar infarcts.



[Series 5: head 2.0 mpr true ax · axial · 0.43mm/px · z∈[-138,-3]mm · 8 of 79 slices shown, 10 images]
[im 5/79  brain]
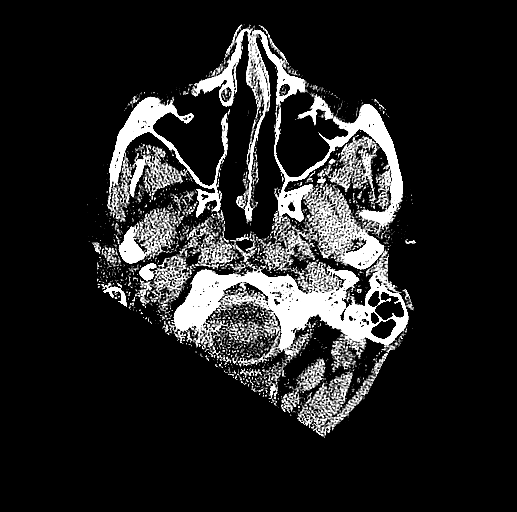
[im 5/79  bone]
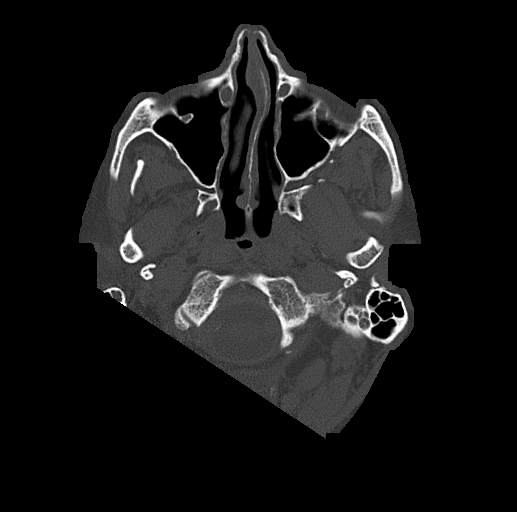
[im 15/79  brain]
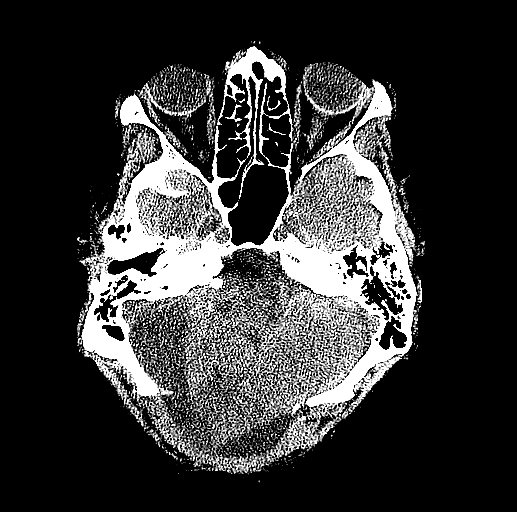
[im 25/79  brain]
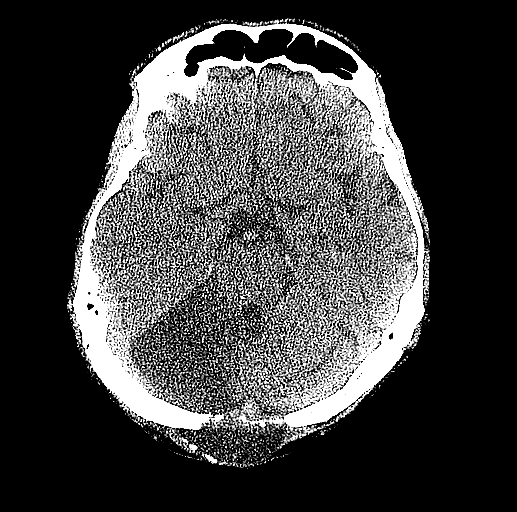
[im 35/79  brain]
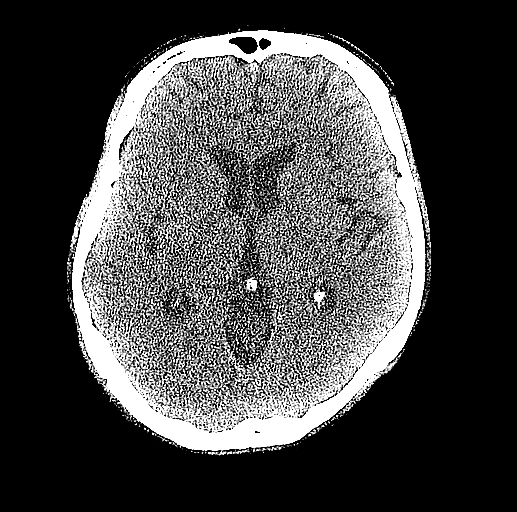
[im 44/79  brain]
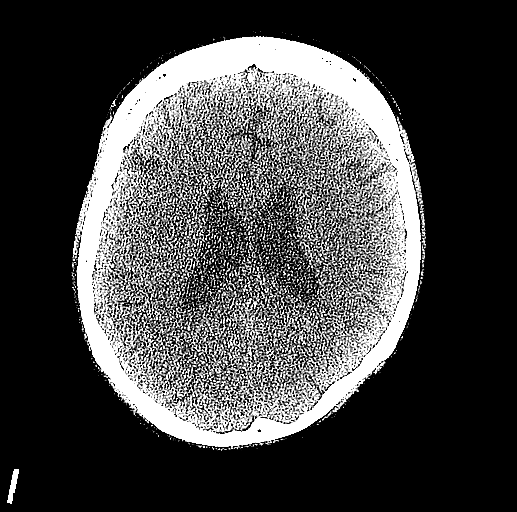
[im 44/79  bone]
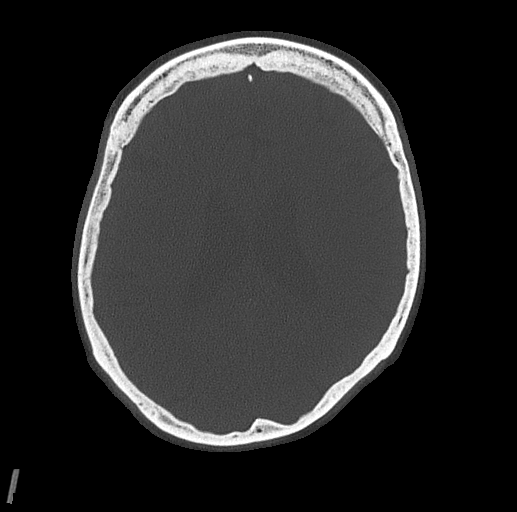
[im 54/79  brain]
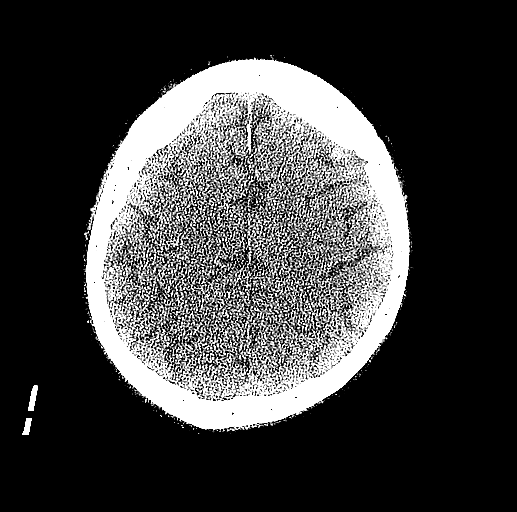
[im 64/79  brain]
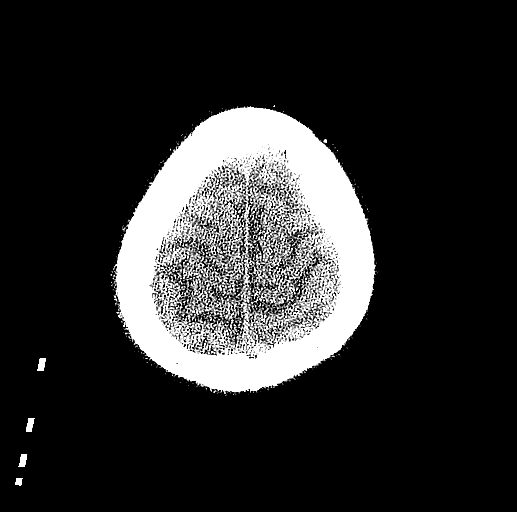
[im 74/79  brain]
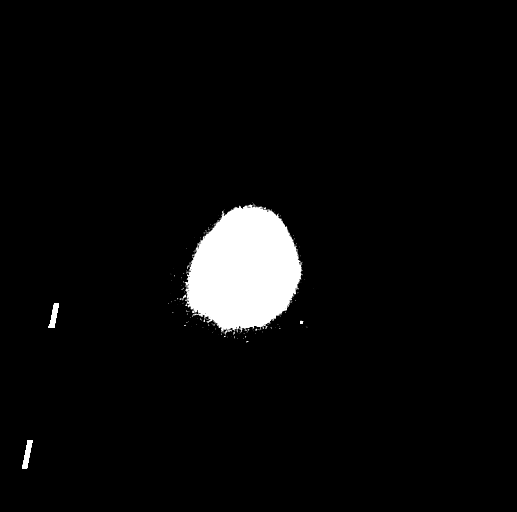

[Series 6: head 3.0 mpr cor · coronal · 0.31mm/px · 3 of 68 slices shown]
[im 23/68  brain]
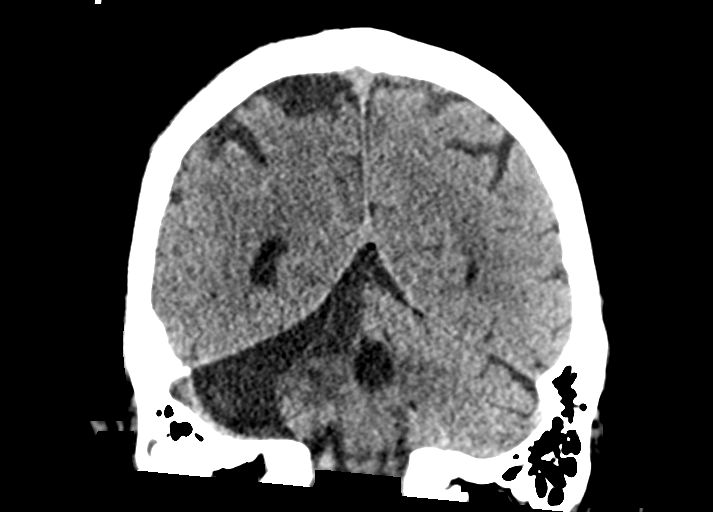
[im 30/68  brain]
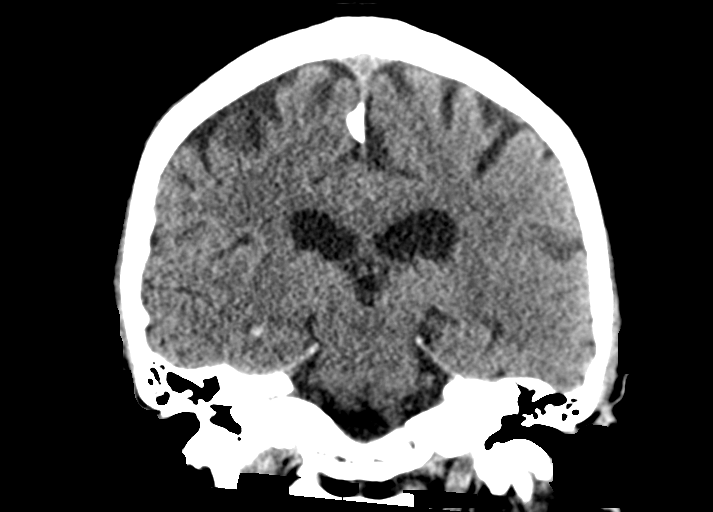
[im 38/68  brain]
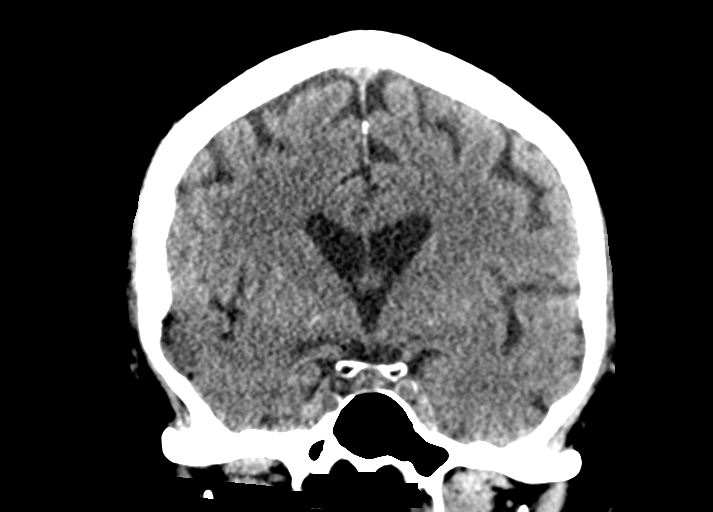

[Series 7: head 3.0 mpr sag · sagittal · 0.31mm/px · 3 of 57 slices shown]
[im 19/57  brain]
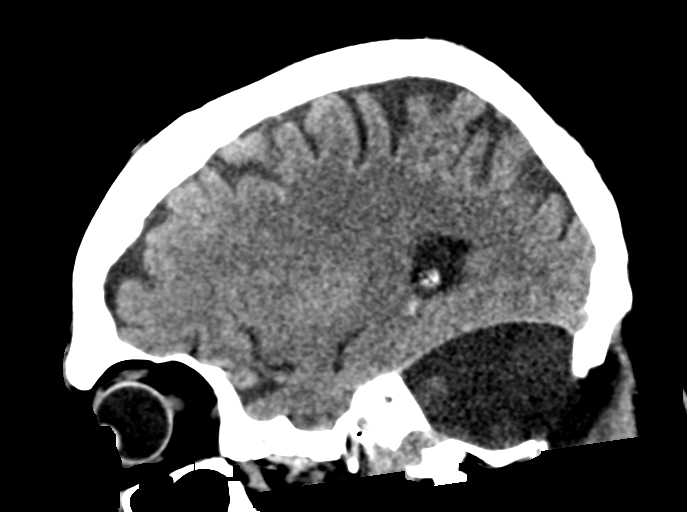
[im 29/57  brain]
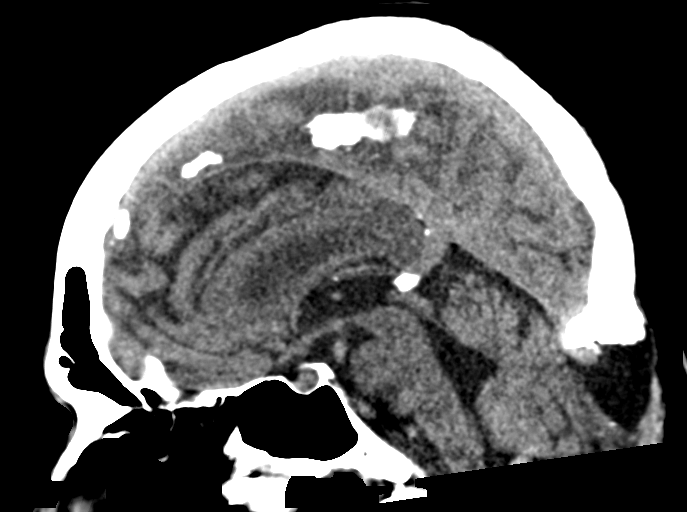
[im 38/57  brain]
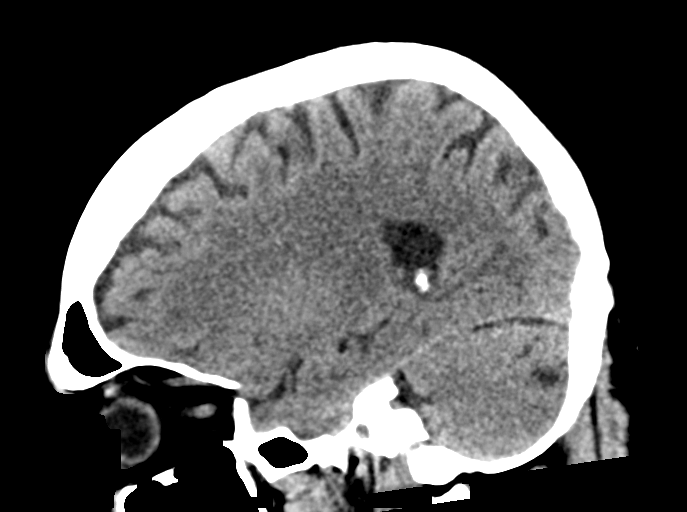

[14 of 47 positions shown; findings below may reference images not displayed]

FINDINGS: Brain: No evidence of acute infarction, hemorrhage, hydrocephalus,
extra-axial collection or mass lesion/mass effect.

Encephalomalacia within bilateral cerebellar hemispheres is stable
compared with the previous exam compatible with chronic infarct.
Postoperative changes from suboccipital craniectomy identified.
Right side postop CSF hygroma combined with meningocele and partial
right cerebellar herniation is unchanged from previous exam.

Vascular: No hyperdense vessel or unexpected calcification.

Skull: No acute osseous findings. Status post suboccipital
craniectomy.

Sinuses/Orbits: No acute findings.

Other: None
IMPRESSION: 1. No acute intracranial abnormalities.
2. Chronic bilateral cerebellar infarcts.
3. Status post suboccipital craniectomy with stable right side
postop CSF hygroma combined with meningocele and partial right
cerebellar herniation.

## 2022-01-31 MED ORDER — QUETIAPINE FUMARATE 50 MG PO TABS
100.0000 mg | ORAL_TABLET | Freq: Every day | ORAL | Status: DC
  Administered 2022-01-31 – 2022-02-07 (×8): 100 mg
  Filled 2022-01-31 (×8): qty 2

## 2022-01-31 MED ORDER — QUETIAPINE FUMARATE 25 MG PO TABS
25.0000 mg | ORAL_TABLET | Freq: Every day | ORAL | Status: DC
  Administered 2022-01-31 – 2022-02-07 (×8): 25 mg
  Filled 2022-01-31 (×8): qty 1

## 2022-01-31 MED ORDER — QUETIAPINE FUMARATE 25 MG PO TABS
25.0000 mg | ORAL_TABLET | Freq: Every day | ORAL | Status: DC
  Administered 2022-02-01 – 2022-02-08 (×8): 25 mg
  Filled 2022-01-31 (×8): qty 1

## 2022-01-31 NOTE — Progress Notes (Signed)
PROGRESS NOTE     Pt with fall last night while returning from bathroom. Apparently got out of waist belt and was caught in the bathroom. He is also having significant hematuria with clots. Pt struck head during fall. CT reviewed and without changes. Pt lethargic this morning  ROS: Limited due to cognitive/behavioral   Objective:   CT HEAD WO CONTRAST (5MM)  Result Date: 01/31/2022 CLINICAL DATA:  Mental status change. History of bilateral cerebellar infarcts. EXAM: CT HEAD WITHOUT CONTRAST TECHNIQUE: Contiguous axial images were obtained from the base of the skull through the vertex without intravenous contrast. RADIATION DOSE REDUCTION: This exam was performed according to the departmental dose-optimization program which includes automated exposure control, adjustment of the mA and/or kV according to patient size and/or use of iterative reconstruction technique. COMPARISON:  01/29/2022 FINDINGS: Brain: No evidence of acute infarction, hemorrhage, hydrocephalus, extra-axial collection or mass lesion/mass effect. Encephalomalacia within bilateral cerebellar hemispheres is stable compared with the previous exam compatible with chronic infarct. Postoperative changes from suboccipital craniectomy identified. 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 Vascular: No hyperdense vessel or unexpected calcification. Skull: No acute osseous findings. Status post suboccipital craniectomy. Sinuses/Orbits: No acute findings. Other: None IMPRESSION: 1. No acute intracranial abnormalities. 2. Chronic bilateral cerebellar infarcts. 3. 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 Electronically Signed   By: Kerby Moors M.D.   On: 01/31/2022 06:19   Recent Labs     01/30/22 0506  WBC 4.7  HGB 10.4*  HCT 31.7*  PLT 283      Recent Labs    01/30/22 0506  NA 140  K 4.0  CL 104  CO2 29  GLUCOSE 103*  BUN 17  CREATININE 0.80  CALCIUM 8.6*        Intake/Output Summary (Last 24 hours) at 01/31/2022 0854 Last data filed at 01/31/2022 0839 Gross per 24 hour  Intake 0 ml  Output --  Net 0 ml               Physical Exam: Vital Signs Blood pressure (!) 130/54, pulse 75, temperature 98.6 F (37 C), temperature source Oral, resp. rate 16, height 5\' 10"  (1.778 m), weight 88.3 kg, SpO2 99 %.   Constitutional: No distress . Vital signs reviewed. HEENT: NCAT, EOMI, oral membranes moist Neck: supple Cardiovascular: RRR without murmur. No JVD    Respiratory/Chest: CTA Bilaterally without wheezes or rales. Normal effort    GI/Abdomen: BS +, non-tender, non-distended Ext: no clubbing, cyanosis, or edema Psych: somnolent, followed basic commands GU; urine red Skin: No evidence of breakdown, no evidence of rash.  Lac over right eye brow Neuro:  .alert, oriented to person, [place], speech slurred. Ataxic limbs. Moves all 4's. No resting tone. Sensory exam grossly intact.     Musculoskeletal: no jt pain   Assessment/Plan: 1. Functional deficits which require 3+ hours per day of interdisciplinary therapy in a comprehensive inpatient rehab setting. Physiatrist is providing close team supervision and 24 hour management of active medical problems listed below. Physiatrist and rehab team  continue to assess barriers to discharge/monitor patient progress toward functional and medical goals  Care Tool:  Bathing    Body parts bathed by patient: Right arm, Left arm, Right lower leg, Left lower leg, Face, Chest, Abdomen, Front perineal area, Buttocks, Right upper leg, Left upper leg   Body parts bathed by helper: Right arm, Left arm, Chest, Abdomen     Bathing assist Assist Level: Minimal Assistance - Patient > 75%     Upper Body  Dressing/Undressing Upper body dressing   What is the patient wearing?: Pull over shirt    Upper body assist Assist Level: Supervision/Verbal cueing    Lower Body Dressing/Undressing Lower body dressing      What is the patient wearing?: Pants, Incontinence brief     Lower body assist Assist for lower body dressing: Moderate Assistance - Patient 50 - 74%     Toileting Toileting    Toileting assist Assist for toileting: Moderate Assistance - Patient 50 - 74%     Transfers Chair/bed transfer  Transfers assist     Chair/bed transfer assist level: Minimal Assistance - Patient > 75% Chair/bed transfer assistive device: Programmer, multimedia   Ambulation assist   Ambulation activity did not occur: Safety/medical concerns  Assist level: Minimal Assistance - Patient > 75% Assistive device: Walker-rolling Max distance: 200 ft   Walk 10 feet activity   Assist  Walk 10 feet activity did not occur: Safety/medical concerns  Assist level: Minimal Assistance - Patient > 75% Assistive device: Walker-rolling   Walk 50 feet activity   Assist Walk 50 feet with 2 turns activity did not occur: Safety/medical concerns  Assist level: Minimal Assistance - Patient > 75% Assistive device: Walker-rolling    Walk 150 feet activity   Assist Walk 150 feet activity did not occur: Safety/medical concerns  Assist level: Minimal Assistance - Patient > 75% Assistive device: Walker-rolling    Walk 10 feet on uneven surface  activity   Assist Walk 10 feet on uneven surfaces activity did not occur: Safety/medical concerns         Wheelchair     Assist Is the patient using a wheelchair?: Yes Type of Wheelchair: Manual Wheelchair activity did not occur: Safety/medical concerns (limited by cognitive deficits)  Wheelchair assist level: Supervision/Verbal cueing Max wheelchair distance: 57 ft    Wheelchair 50 feet with 2 turns activity    Assist     Wheelchair 50 feet with 2 turns activity did not occur: Safety/medical concerns   Assist Level: Supervision/Verbal cueing   Wheelchair 150 feet activity     Assist  Wheelchair 150 feet activity did not occur: Safety/medical concerns       Blood pressure (!) 130/54, pulse 75, temperature 98.6 F (37 C), temperature source Oral, resp. rate 16, height 5\' 10"  (1.778 m), weight 88.3 kg, SpO2 99 %.    Medical Problem List and Plan: 1. 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  Status post posterior decompression with craniectomy 11/26/2021             -patient may not yet shower             -ELOS/Goals:   2/21---  supervision to min assist goals   - discuss discharge/dispo with team tomorrow in conference  --Continue CIR therapies including PT, OT, and SLP   -I have concerns about whether his wife can manage him at home. Hopefully with rx of his  UTI, we'll see some improvement.   -needs 2 person assist to bathroom for safety 2.  Antithrombotics: -DVT/anticoagulation:  Pharmaceutical: Other (comment) Eliquis             -antiplatelet therapy: N/A 3. Pain Management: Tizanidine no longer ordered, Voltaren gel 4 times daily, Fioricet as needed, oxycodone as needed 4. Mood/sleep/behavior:                 -antipsychotic agents: seroquel               - sleep chart    - currently seroquel 50 mg qam and 150mg  qhs  -continue HS celexa for sleep and mood stabilization  -rx'ed UTI 2/16 --ongoing HS agitation/confusion -rx uti  -increased celexa to 20mg  qhs  -will back off seroquel dosing but keep on same schedule   -change to 9m-43m-100m daily  -order enclosure bed for safety 5. Neuropsych: This patient is not capable of making decisions on his own behalf.  - telesitter ongoing-  6. Skin/Wound Care: Routine skin checks 7. Fluids/Electrolytes/Nutrition:   -IR replaced foley with new balloon bumper  PEG yesterday. Appreciate help  - continue bolus feeds 8.  Prolonged ventilatory support.  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     Decannulated 1/16- stoma closed 9.  Dysphagia.  Status post gastrostomy tube 12/13/2021 per Dr.Lovick-  -continue NPO, trials with SLP only. 10.  Aspiration pneumonia.  Antibiotic therapy completed.  -afebrile  -oob, oxygenation improved 11.  New onset atrial fibrillation.  Started Eliquis 12/17/2021.  Continue low-dose beta-blocker.    HR controlled  -appreciate cardiology consult. Discussed case with Dr. Gardiner Rhyme today.    2/16 now with hematuria and clots   -low dose eliquis stopped 2/9   -dc asa and plavix today   -will have to reconsider cards plan/watchman    Vitals:   01/31/22 0405 01/31/22 0655  BP: 135/73 (!) 130/54  Pulse: 79 75  Resp: 20 16  Temp: 98 F (36.7 C) 98.6 F (37 C)  SpO2: 98% 99%    12.  Hypertension.  Continue Norvasc 10 mg daily, Cozaar 50 mg daily, , clonidine 0.1 mg every 8 hours.  ,  2/3-2/4 BP controlled. Clonidine d/ced 2/3  2/10 don't believe this morning's bp is correct.   2/16 bp better off norvasc, increased H2O through PEG 13.  Hyperlipidemia.  Lipitor 14.  History of BPH/urinary retention.        -generally has been voiding but is incontinent 15.  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  Follow-up per cardiology services. 16. Hematuria related to prostate cancer/radiation, recurrent UTI   Bleeding with both eliquis and plavix,asa  -hgb stable yesterday prior to onset of gross hematuria - 100k E coli UTI was rx'ed with keflex -2/16 most recent ucx again with e coli 100k.   -continue empiric keflex  --urology has seen, not a lot of options ?finasteride if can take po                  LOS: 37 days A FACE TO FACE EVALUATION WAS PERFORMED  Meredith Staggers 01/31/2022, 8:54 AM

## 2022-01-31 NOTE — Progress Notes (Signed)
Occupational Therapy Session Note  Patient Details  Name: [name] MRN: [id number] Date of Birth: 04/24/1946  Today's Date: 01/31/2022 OT Individual Time: 0915-1000 OT Individual Time Calculation (min): 45 min    Short Term Goals: Week 6:  OT Short Term Goal 1 (Week 6): Continued progressing toward established LTG  Skilled Therapeutic Interventions/Progress Updates:    Pt received supine with no c/o pain. Pt could recall events of last night slightly. Language slightly worse but bright and alert. He reported urinary incontinence but was able to appropriately wait for OT to assist him/gather items needed. He completed bed mobility with close (S) to EOB. Sit > stand with retropulsion causing LOB but min A overall to maintain balance once cued for improved body mechanics. Pt completed an ambulatory transfer to the bathroom with min A. He voided urine- copious amount of blood and clots. He completed hygiene with min A. New brief and pants donned with mod A. He returned to the w/c. Pt completed 2 set of blocked practice sit <> stand without UE support to improve motor planning and strengthening. He was left sitting up at the nurses desk with all needs met, posey waist belt on. Provided with newspaper.   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  Therapy/Group: Individual Therapy  [name] 01/31/2022, 6:25 AM

## 2022-01-31 NOTE — Progress Notes (Addendum)
Pt was able to get out of his waste restraint by pulling belt up over his head according to the telesitter.  Telesitter called charge, Katharine Look, and she went in room where the NT, Viri was with the pt.  Pt had an assisted fall in bathroom with the NT, Viri.  He did hit his head on the way down.  Eunice notified and she said to do a CT without contrast. Vitals: T-98, BP-135/73, HR-79, R-20, O2-98%.

## 2022-01-31 NOTE — Progress Notes (Addendum)
Physical Therapy Session Note  Patient Details  Name: [name] MRN: [id number] Date of Birth: Jul 31, 1946  Today's Date: 01/31/2022 PT Individual Time: 1425-1600 PT Individual Time Calculation (min): 95 min   Short Term Goals: Week 5:  PT Short Term Goal 1 (Week 5): STG=LTG due to ELOS  Skilled Therapeutic Interventions/Progress Updates:     Patient in TIS w/c with his sister in the room upon PT arrival. Patient alert and agreeable to PT session. Patient denied pain during session.  Focused session on educating patient and his sister on the purpose of the enclosure bed and a plan for patient safety and reduced agitation and falls. Patient recalls the events leading up to the fall last night with nursing. Reports that the main issue is waiting on staff to come so he can go to the bathroom. When he feels he cannot hold himself, he finds a way to get to the bathroom independently. Patient continues to present with intermittent language of confusion and poor incite into deficits. Educated on patient's increased fall risk due to balance deficits, impulsivity, and decreased safety awareness.   Utilized therapeutic use of self while bringing in the enclosure bed and showed the patient how the bed works, that he will not be able to open the bed on his own and that he would not need the waist or wrist restraints to hold him down while in this bed. Patient able to understand that he would not be able to get out and "have" to wait for staff. PT reassured him that nursing would be involved in updating a plan for improved response time for toileting. In coordination with RN, rearranged the room to remove the hospital bed and set-up the enclosure bed close to the bathroom for reduced travel distance to the bathroom. Also, placed BSC by the bed and simulated use of BSC as demonstration for patient to understand that he could toilet easier and safer with the [facility] by the bed, patient stated understanding. Placed  TIS w/c at the other end of the bed for ease of transfers to the w/c for transport to the bathroom, or for sitting up with one-on-one supervision from family or at the nursing station. Patient observed and educated on purpose of placement of all furniture and equipment, and provided input on placement demonstrating understanding of improved toileting access. Had patient press the call bell to simulate alerting nursing staff that he needed to use the bathroom. NT took <2 min to answer the call, educated patient on taking deep breaths while waiting to reduce anxiety then reviewed standing to use the [facility] with NT. Patient agreeable and stated understanding, will need reinforcement. Educated patient to use the bed for daytime napping/resting and night time sleeping and to sit up in the TIS w/c during the day to reduce frustration with the enclosure bed. Patient, sister, and RN in agreement.   Following session: Updated behavior plan to reflect changes in interventions and interactions to improve patient safety. Placed on bathroom door with a picture of the room set-up to continue ease of transfers and access to the bathroom. Tyler Lai, RN, [place], [place], [place], charge nurse, [place] Sousa, [place], and Erline Levine, nurse case manager reviewed the plan for the patient and in agreement.   Patient agreeable to work on gait training following the above therapeutic activity.   Therapeutic Activity: Transfers: Patient performed sit to/from stand x1 without AD with min A and x1 using RW with CGA. Provided verbal cues for slow controlled movements and forward weight  shift. Increased posterior bias without AD.  Gait Training:  Patient ambulated 20 feet with mod A without an AD to assess safety with and without AD. Patient with poor safety awareness and reaching for objects for steadying support with decreased balance and increased posterior and R bias without AD. He then ambulated 334 feet using RW with min A progressing to CGA with  increased repetition demonstrating improved safety and balance with intermittent cuing for attention to task and maintaining RW on the ground.   Patient in enclosure bed with his sister at bedside with bed open as PT addressed the behavior plan with staff, see above. Patient stayed in the bed for 20 min without attempts to get OOB. Patient's sister preparing to leave and PT secured the bed and patient agreeable to take a nap at this time then call if he would like to sit at the nurses station later this afternoon. Patient in secured enclosure bed at end of session with breaks locked and all needs within reach.   *ABS: 17  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    Therapy/Group: Individual Therapy  [name] PT, DPT  01/31/2022, 4:39 PM

## 2022-01-31 NOTE — Progress Notes (Signed)
Nutrition Follow-up  DOCUMENTATION CODES:   Not applicable  INTERVENTION:  - Continue bolus feeds via PEG using Jevity 1.5 cal formula at volume of 356 mfL (1.5 cartons/ARCS, 6 total cartons per day) given QID   - Continue 45 mL Prosource TF TID per tube   - Continue free water flushes of 250 mL q 4 hours per tube   - Tube feeding regimen provides 2256 kcal, 124 grams protein, and 2582 ml free water.   Upon discharge home, may substitute formula with Nutren 1.5 cal if unable to obtain Jevity 1.5 cal.   NUTRITION DIAGNOSIS:   Inadequate oral intake related to inability to eat as evidenced by NPO status.  Ongoing   GOAL:   Patient will meet greater than or equal to 90% of their needs  Met with TF   MONITOR:   Labs, Weight trends, TF tolerance, Skin, I & O's  REASON FOR ASSESSMENT:   Consult Enteral/tube feeding initiation and management  ASSESSMENT:   76 year old male with history of CAD status post CABG x4 2012, hyperlipidemia, nephrolithiasis, BPH/prostate cancer. Presented 11/25/2021 with altered mental status as well as dizziness with ataxic gait and diaphoretic. 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 Pt underwent suboccipital decompressive craniectomy 11/26/2021. Pt intubated for prolonged time underwent tracheostomy 12/10/2021 and PEG tube placement 12/13/2021. Pt with decreased functional mobility and admitted to CIR.  During visit, pt was sitting up in chair and pt's sister present. Per pt, pt has been tolerating bolus tube feedings well. Pt does report emesis two days ago and reported that this happened after a tube feeding. Per pt, no emesis today or yesterday and tolerating tube feedings well. Pt denies any nausea or constipation. Will continue with current tube feeding regimen and free water flushes due to pt tolerating well. Tube feeds continue to provide 100% of calorie and protein needs.   Labs and medications reviewed.    Diet Order:    Diet Order             Diet NPO time specified  Diet effective midnight                   EDUCATION NEEDS:   Not appropriate for education at this time  Skin:  Skin Assessment: Reviewed RN Assessment Skin Integrity Issues:: Incisions Incisions: head  Last BM:  2/16; Type 7  Height:   Ht Readings from Last 1 Encounters:  12/25/21 5' 10"  (1.778 m)    Weight:   Wt Readings from Last 1 Encounters:  01/31/22 88.3 kg     BMI:  Body mass index is 27.93 kg/m.  Estimated Nutritional Needs:   Kcal:  2200-2400 kcal/d  Protein:  115-125 grams  Fluid:  >/= 2 L/day    Maryruth Hancock, Dietetic Intern 01/31/2022 2:34 PM

## 2022-01-31 NOTE — Progress Notes (Signed)
SLP Cancellation Note  Patient Details Name: [name] MRN: [id number] DOB: [birth year]   Cancelled treatment:       Patient missed 60 minutes ([id number]) of skilled SLP intervention due to fatigue. Patient with minimal arousal despite Max multimodal cues. Patient only able to maintain arousal for ~30-60 second intervals. Nursing aware.  Will re-attempt as able                                                                                                 Yoniel Arkwright 01/31/2022, 3:43 PM

## 2022-01-31 NOTE — Progress Notes (Signed)
Pt back from CT

## 2022-01-31 NOTE — Significant Event (Signed)
**  Late Entry**   01/31/22 0425  What Happened  Was fall witnessed? Yes  Who witnessed fall? Viri, NT  Patients activity before fall ambulating-unassisted  Point of contact head  Was patient injured? Unsure  Patient found in bathroom  Found by Staff-comment  Stated prior activity bathroom-unassisted  Follow Up  MD notified Danella Sensing  Time MD notified [phone]  Additional tests Yes-comment (Head CT ordered)  Progress note created (see row info) Yes  Adult Fall Risk Assessment  Risk Factor Category (scoring not indicated) Fall has occurred during this admission (document High fall risk)  Age 76  Fall History: Fall within 6 months prior to admission 5  Elimination; Bowel and/or Urine Incontinence 0  Elimination; Bowel and/or Urine Urgency/Frequency 0  Medications: includes PCA/Opiates, Anti-convulsants, Anti-hypertensives, Diuretics, Hypnotics, Laxatives, Sedatives, and Psychotropics 5  Patient Care Equipment 3  Mobility-Assistance 2  Mobility-Gait 2  Mobility-Sensory Deficit 0  Altered awareness of immediate physical environment 1  Impulsiveness 2  Lack of understanding of one's physical/cognitive limitations 4  Total Score 26  Patient Fall Risk Level High fall risk  Adult Fall Risk Interventions  Required Bundle Interventions *See Row Information* High fall risk - low, moderate, and high requirements implemented  Additional Interventions Use of appropriate toileting equipment (bedpan, BSC, etc.)  Screening for Fall Injury Risk (To be completed on HIGH fall risk patients) - Assessing Need for Floor Mats  Risk For Fall Injury- Criteria for Floor Mats Previous fall this admission  Will Implement Floor Mats Yes  Vitals  Temp 98 F (36.7 C)  Temp Source Oral  BP 135/73  Pulse Rate 79  Pulse Rate Source Dinamap  Resp 20  Oxygen Therapy  SpO2 98 %  O2 Device Room Air  Pain Assessment  Pain Scale Faces  Pain Score 0  Faces Pain Scale 0  PAINAD (Pain Assessment in Advanced  Dementia)  Breathing 0  Negative Vocalization 0  Facial Expression 0  Body Language 0  Consolability 0  PAINAD Score 0  PCA/Epidural/Spinal Assessment  Respiratory Pattern Regular;Unlabored  Neurological  Neuro (WDL) X  Level of Consciousness Alert  Orientation Level Oriented to person  Cognition Impulsive;Poor attention/concentration  Speech Clear  R Hand Grip Moderate  L Hand Grip Moderate  R Foot Dorsiflexion Moderate  L Foot Dorsiflexion Moderate  R Foot Plantar Flexion Moderate  L Foot Plantar Flexion Moderate  RUE Motor Response Purposeful movement  LUE Motor Response Purposeful movement  RLE Motor Response Purposeful movement  LLE Motor Response Purposeful movement  Neuro Additional Assessments No  Musculoskeletal  Musculoskeletal (WDL) WDL  Assistive Device Front wheel walker  Generalized Weakness Yes  Weight Bearing Restrictions No  Integumentary  Integumentary (WDL) X  Skin Color Appropriate for ethnicity  Skin Condition Dry  Skin Integrity Other (Comment) (Catheter entry/exit)  Skin Turgor Non-tenting  Neurological  Pupil Assessment  No

## 2022-01-31 NOTE — Progress Notes (Signed)
Pt off the floor for CT of the head due to fall.

## 2022-02-01 LAB — CBC
HCT: 29.4 % — ABNORMAL LOW (ref 39.0–52.0)
Hemoglobin: 9.4 g/dL — ABNORMAL LOW (ref 13.0–17.0)
MCH: 30 pg (ref 26.0–34.0)
MCHC: 32 g/dL (ref 30.0–36.0)
MCV: 93.9 fL (ref 80.0–100.0)
Platelets: 273 10*3/uL (ref 150–400)
RBC: 3.13 MIL/uL — ABNORMAL LOW (ref 4.22–5.81)
RDW: 15.5 % (ref 11.5–15.5)
WBC: 5.1 10*3/uL (ref 4.0–10.5)
nRBC: 0 % (ref 0.0–0.2)

## 2022-02-01 LAB — BASIC METABOLIC PANEL
Anion gap: 10 (ref 5–15)
BUN: 21 mg/dL (ref 8–23)
CO2: 26 mmol/L (ref 22–32)
Calcium: 8.8 mg/dL — ABNORMAL LOW (ref 8.9–10.3)
Chloride: 104 mmol/L (ref 98–111)
Creatinine, Ser: 0.83 mg/dL (ref 0.61–1.24)
GFR, Estimated: >60 mL/min (ref >60)
Glucose, Bld: 97 mg/dL (ref 70–99)
Potassium: 4.2 mmol/L (ref 3.5–5.1)
Sodium: 140 mmol/L (ref 135–145)

## 2022-02-01 NOTE — Progress Notes (Addendum)
Physical Therapy TBI Note  Patient Details  Name: [name] MRN: [id number] Date of Birth: 14-Jun-1946  Today's Date: 02/01/2022 PT Individual Time: 0901-0959 PT Individual Time Calculation (min): 58 min   Short Term Goals: Week 3:  PT Short Term Goal 1 (Week 3): Patient will perform basic transfers with min A consistently. PT Short Term Goal 1 - Progress (Week 3): Met PT Short Term Goal 2 (Week 3): Patient will ambulate mod-min A without overt LOB using LRAD >200 ft. PT Short Term Goal 2 - Progress (Week 3): Met PT Short Term Goal 3 (Week 3): Patient will perform 4 steps using 1 rail with min A of 1 person. PT Short Term Goal 3 - Progress (Week 3): Progressing toward goal Week 4:  PT Short Term Goal 1 (Week 4): STG=LTG due to ELOS PT Short Term Goal 1 - Progress (Week 4): Progressing toward goal  Skilled Therapeutic Interventions/Progress Updates:  Patient seated in TIS w/c at nurse's station. Patient alert and agreeable to PT session.   Patient with no pain complaint throughout session.  Therapeutic Activity: Transfers: Patient performed sit<>stand and stand pivot transfers throughout session with supervision/ CGA for balance. Pt is intermittently impulsive in initiation of rise to stand. Provided verbal cues for slowing performance and focusing on task.  Gait Training/NMR:  Patient ambulated in day room and guided in use of RW around 8 cones. First pass through pt requires MinA to maintain balance with cues to keep walker on floor and maintain position at walker. Performance improves on return trip to seat. Second bout performed with much improvement and CGA with intermittent need for MinA for balance. Final run through includes two 6" hurdles. Pt is impulsive in first pass throughout is able to clear hurdles. With verbal cueing and focus drawn to slowing pace and focus on performance, his ability to manage walker and perform step-over improves. NMR performed for improvements in  motor control and coordination, balance, sequencing, judgement, and self confidence/ efficacy in performing all aspects of mobility at highest level of independence.   Pt then relates need to toilet and ambulates back to room covering ~65' into bathroom. Transfer with CGA. Pt is able to manage his clothing with supervision. Significant blood found in urine and reported to NT and RN who related that this is now a regular occurrence d/t to new comorbidity discovery. Pt able to ambulate back to day room using RW with CGA/ intermittent MinA.   Pt guided in stair training and is able to ascend with reiprocating step pattern using BHR and descend with step-to pattern in first bout. Then is able to perform with same stepping pattern but only using LHR to ascend and RHR to descend. CGA throughout with vc for effort, technique, and sequencing.   Following stairs, pt relates feeling lightheaded. Guided to sit in w/c and vitals taken with BP at 129/ 83, pulse at 91, and SpO2 at 100%. By end of vitals, pt relates symptoms are gone.   Patient returned to [facility] w/c and brought to nurse's station at end of session with brakes locked. Pt set up with book and glasses.      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   Agitated Behavior Scale: TBI Observation Details Observation Environment: CIR Start of observation period - Date: 02/01/22 Start of observation period - Time:  0900 End of observation period - Date: 02/01/22 End of observation period - Time: 1000 Agitated Behavior Scale (DO NOT LEAVE BLANKS) Short attention span, easy distractibility, inability to concentrate: Present to a slight degree Impulsive, impatient, low tolerance for pain or frustration: Present to a slight degree Uncooperative, resistant to care, demanding: Absent Violent and/or threatening violence toward people or property: Absent Explosive and/or unpredictable anger: Absent Rocking, rubbing, moaning, or other self-stimulating behavior: Absent Pulling at tubes, restraints, etc.: Absent Wandering from treatment areas: Absent Restlessness, pacing, excessive movement: Absent Repetitive behaviors, motor, and/or verbal: Absent Rapid, loud, or excessive talking: Present to a slight degree Sudden changes of mood: Absent Easily initiated or excessive crying and/or laughter: Absent Self-abusiveness, physical and/or verbal: Absent Agitated behavior scale total score: 17   Therapy/Group: Individual Therapy  Alger Simons PT, DPT 02/01/2022, 5:09 PM

## 2022-02-01 NOTE — Progress Notes (Signed)
Speech Language Pathology Daily Session Note  Patient Details  Name: [name] MRN: [id number] Date of Birth: 04-Feb-1946  Today's Date: 02/01/2022 SLP Individual Time: 0725-0805 SLP Individual Time Calculation (min): 40 min  Short Term Goals: Week 6: SLP Short Term Goal 1 (Week 6): STGs=LTGs due to ELOS  Skilled Therapeutic Interventions: Skilled treatment session focused on cognitive and dysphagia goals. Upon arrival, patient was awake in enclosure bed and agreeable to therapy session. Patient' brief was dry but patient's bed and shirt appeared to be wet with urine. SLP donned TED hoes and threaded pants. Patient independently removed his jacket and shirt and donned a clean shirt. Patient stood EOB to pull up pants and was transferred to the wheelchair. Patient performed basic self-care tasks at the sink with set-up assist. Patient consumed trials of ice chips and demonstrated consistent overt s/s of aspiration in 90% of trials with Max verbal cues needed for use of swallowing compensatory strategies. Patient required total A for orientation to date with language of confusion noted. Increased phonemic paraphasias and difficulty with verbal expression noted throughout session. Patient left upright in wheelchair with soft waist belt in place at RN station. Continue with current plan of care.      Pain No/Denies Pain   Therapy/Group: Individual Therapy  Poppi Scantling 02/01/2022, 12:27 PM

## 2022-02-01 NOTE — Progress Notes (Signed)
Occupational Therapy Session Note  Patient Details  Name: [name] MRN: [id number] Date of Birth: Aug 22, 1946  Today's Date: 02/01/2022 OT Individual Time: 1330-1440 OT Individual Time Calculation (min): 70 min    Short Term Goals: Week 6:  OT Short Term Goal 1 (Week 6): Continued progressing toward established LTG  Skilled Therapeutic Interventions/Progress Updates:    Pt received sitting in the TIS w/c with his wife Terri present. Extensive discussion re fall risk reduction, energy conservation strategies, home accessibility/set up, and caregiver safety. Discussed recommendation to push their bed at home against the wall to prevent pt from getting up unassisted at night. Extensive problem solving re home safety. Moved around pt's room to mimic set up of home environment. Pt completed toileting tasks transferring to [facility] with min A overall. Urine filled with blood and large clots. Smear of BM as well. Pt returned to the enclosure bed with min A. Took another picture of pt's room to post to ensure consistency with nursing staff. He was left supine in the enclosure bed with all needs met, Terri present.     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   Therapy/Group: Individual Therapy  [name] 02/01/2022, 6:19 AM

## 2022-02-01 NOTE — Progress Notes (Signed)
PROGRESS NOTE     Had a better night per nursing in enclosure bed. Did want to get out this morning and was up at nurses station when I arrived. He was reading a book.  ROS: Limited due to cognitive/behavioral   Objective:   CT HEAD WO CONTRAST (5MM)  Result Date: 01/31/2022 CLINICAL DATA:  Mental status change. History of bilateral cerebellar infarcts. EXAM: CT HEAD WITHOUT CONTRAST TECHNIQUE: Contiguous axial images were obtained from the base of the skull through the vertex without intravenous contrast. RADIATION DOSE REDUCTION: This exam was performed according to the departmental dose-optimization program which includes automated exposure control, adjustment of the mA and/or kV according to patient size and/or use of iterative reconstruction technique. COMPARISON:  01/29/2022 FINDINGS: Brain: No evidence of acute infarction, hemorrhage, hydrocephalus, extra-axial collection or mass lesion/mass effect. Encephalomalacia within bilateral cerebellar hemispheres is stable compared with the previous exam compatible with chronic infarct. Postoperative changes from suboccipital craniectomy identified. 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 Vascular: No hyperdense vessel or unexpected calcification. Skull: No acute osseous findings. Status post suboccipital craniectomy. Sinuses/Orbits: No acute findings. Other: None IMPRESSION: 1. No acute intracranial abnormalities. 2. Chronic bilateral cerebellar infarcts. 3. 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 Electronically Signed   By: Kerby Moors M.D.   On: 01/31/2022 06:19   Recent Labs    01/30/22 0506 02/01/22 0617  WBC 4.7 5.1  HGB 10.4* 9.4*  HCT 31.7* 29.4*  PLT 283 273      Recent Labs     01/30/22 0506 02/01/22 0617  NA 140 140  K 4.0 4.2  CL 104 104  CO2 29 26  GLUCOSE 103* 97  BUN 17 21  CREATININE 0.80 0.83  CALCIUM 8.6* 8.8*        Intake/Output Summary (Last 24 hours) at 02/01/2022 1054 Last data filed at 02/01/2022 0700 Gross per 24 hour  Intake 0 ml  Output --  Net 0 ml               Physical Exam: Vital Signs Blood pressure (!) 118/59, pulse 67, temperature 98.3 F (36.8 C), temperature source Oral, resp. rate 18, height 5\' 10"  (1.778 m), weight 88.3 kg, SpO2 100 %.   Constitutional: No distress . Vital signs reviewed. HEENT: NCAT, EOMI, oral membranes moist Neck: supple Cardiovascular: RRR without murmur. No JVD    Respiratory/Chest: CTA Bilaterally without wheezes or rales. Normal effort    GI/Abdomen: BS +, non-tender, non-distended Ext: no clubbing, cyanosis, or edema Psych: pleasant and cooperative, confused GU; urine remains grossly bloody Skin: No evidence of breakdown, no evidence of rash.  Lac over right eye brow Neuro:  .alert, oriented to person, Gibson, speech clearer. Was able to tell me about the book he was reading. Ataxic limbs. Moves all 4's. No resting tone. Sensory exam grossly intact.     Musculoskeletal: no jt pain   Assessment/Plan: 1. Functional deficits which require 3+ hours per day of interdisciplinary therapy in a comprehensive inpatient rehab setting. Physiatrist is providing close  team supervision and 24 hour management of active medical problems listed below. Physiatrist and rehab team continue to assess barriers to discharge/monitor patient progress toward functional and medical goals  Care Tool:  Bathing    Body parts bathed by patient: Right arm, Left arm, Right lower leg, Left lower leg, Face, Chest, Abdomen, Front perineal area, Buttocks, Right upper leg, Left upper leg   Body parts bathed by helper: Right arm, Left arm, Chest, Abdomen     Bathing assist Assist Level: Minimal Assistance  - Patient > 75%     Upper Body Dressing/Undressing Upper body dressing   What is the patient wearing?: Pull over shirt    Upper body assist Assist Level: Supervision/Verbal cueing    Lower Body Dressing/Undressing Lower body dressing      What is the patient wearing?: Pants, Incontinence brief     Lower body assist Assist for lower body dressing: Moderate Assistance - Patient 50 - 74%     Toileting Toileting    Toileting assist Assist for toileting: Moderate Assistance - Patient 50 - 74%     Transfers Chair/bed transfer  Transfers assist     Chair/bed transfer assist level: Minimal Assistance - Patient > 75% Chair/bed transfer assistive device: Programmer, multimedia   Ambulation assist   Ambulation activity did not occur: Safety/medical concerns  Assist level: Minimal Assistance - Patient > 75% Assistive device: Walker-rolling Max distance: 200 ft   Walk 10 feet activity   Assist  Walk 10 feet activity did not occur: Safety/medical concerns  Assist level: Minimal Assistance - Patient > 75% Assistive device: Walker-rolling   Walk 50 feet activity   Assist Walk 50 feet with 2 turns activity did not occur: Safety/medical concerns  Assist level: Minimal Assistance - Patient > 75% Assistive device: Walker-rolling    Walk 150 feet activity   Assist Walk 150 feet activity did not occur: Safety/medical concerns  Assist level: Minimal Assistance - Patient > 75% Assistive device: Walker-rolling    Walk 10 feet on uneven surface  activity   Assist Walk 10 feet on uneven surfaces activity did not occur: Safety/medical concerns         Wheelchair     Assist Is the patient using a wheelchair?: Yes Type of Wheelchair: Manual Wheelchair activity did not occur: Safety/medical concerns (limited by cognitive deficits)  Wheelchair assist level: Supervision/Verbal cueing Max wheelchair distance: 57 ft    Wheelchair 50 feet with 2  turns activity    Assist    Wheelchair 50 feet with 2 turns activity did not occur: Safety/medical concerns   Assist Level: Supervision/Verbal cueing   Wheelchair 150 feet activity     Assist  Wheelchair 150 feet activity did not occur: Safety/medical concerns       Blood pressure (!) 118/59, pulse 67, temperature 98.3 F (36.8 C), temperature source Oral, resp. rate 18, height 5\' 10"  (1.778 m), weight 88.3 kg, SpO2 100 %.    Medical Problem List and Plan: 1. 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  Status post posterior decompression with craniectomy 11/26/2021             -patient may not yet shower             -ELOS/Goals:   2/21---  supervision to min assist goals   - discuss discharge/dispo with team tomorrow in conference  -Continue CIR therapies including PT, OT, and SLP    -  needs 2 person assist to bathroom for safety, enclosure bed  2/17 -spoke to wife about status, dc date. May be a "stretch" to discharge him Tuesday. Wife says they're prepared to bring him home. Discharge will depend upon behavioral and medical readiness.  2.  Antithrombotics: -DVT/anticoagulation:  Pharmaceutical: Other (comment) Eliquis             -antiplatelet therapy: N/A 3. Pain Management: Tizanidine no longer ordered, Voltaren gel 4 times daily, Fioricet as needed, oxycodone as needed 4. Mood/sleep/behavior:                 -antipsychotic agents: seroquel               - sleep chart    - currently seroquel 50 mg qam and 150mg  qhs  -continue HS celexa for sleep and mood stabilization  -rx'ed UTI 2/17 -- HS agitation/confusion -rx uti  -increased celexa to 20mg  qhs  -decreased seroquel dosing with same schedule   -change to 58m-19m-100m daily  - enclosure bed for safety, no waist belt except in w/c 5. Neuropsych: This patient is not capable of making decisions on his own behalf.  - telesitter  ongoing-  6. Skin/Wound Care: Routine skin checks 7. Fluids/Electrolytes/Nutrition:   -IR replaced foley with new balloon bumper PEG yesterday. Appreciate help  - continue bolus feeds  -labs for 2/20 8.  Prolonged ventilatory support.  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     Decannulated 1/16- stoma closed 9.  Dysphagia.  Status post gastrostomy tube 12/13/2021 per Dr.Lovick-  -continue NPO, trials with SLP only. 10.  Aspiration pneumonia.  Antibiotic therapy completed.  -afebrile  -oob, oxygenation improved 11.  New onset atrial fibrillation.  Started Eliquis 12/17/2021.  Continue low-dose beta-blocker.    HR controlled  -appreciate cardiology consult. Discussed case with Dr. Gardiner Rhyme today.    2/17 continued hematuria and clots   -low dose eliquis stopped 2/9   -off asa and plavix now   -will not attempt to resume anticoagulants/antiplatelets   -pt will see Dr. Quentin Ore in office to discuss [facility] as outpt    Vitals:   01/31/22 2056 02/01/22 0622  BP: (!) 120/57 (!) 118/59  Pulse: 67 67  Resp: 18 18  Temp: 98.6 F (37 C) 98.3 F (36.8 C)  SpO2: 99% 100%    12.  Hypertension.  Continue Norvasc 10 mg daily, Cozaar 50 mg daily, , clonidine 0.1 mg every 8 hours.  2/3-2/4 BP controlled. Clonidine d/ced 2/3  2/10 don't believe this morning's bp is correct.   2/17 bp better off norvasc, increased H2O through PEG 13.  Hyperlipidemia.  Lipitor 14.  History of BPH/urinary retention.        -generally has been voiding but is incontinent 15.  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  Follow-up per cardiology services. 16. 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   -continue   keflex for 7 days   -hgb down to 9.4---recheck 2/18    --urology has seen, not a lot of  options ?finasteride if can take po                  LOS: 38 days A FACE TO FACE EVALUATION WAS PERFORMED  Meredith Staggers 02/01/2022, 10:54 AM

## 2022-02-02 LAB — CBC
HCT: 27.1 % — ABNORMAL LOW (ref 39.0–52.0)
Hemoglobin: 8.6 g/dL — ABNORMAL LOW (ref 13.0–17.0)
MCH: 30 pg (ref 26.0–34.0)
MCHC: 31.7 g/dL (ref 30.0–36.0)
MCV: 94.4 fL (ref 80.0–100.0)
Platelets: 270 10*3/uL (ref 150–400)
RBC: 2.87 MIL/uL — ABNORMAL LOW (ref 4.22–5.81)
RDW: 15.5 % (ref 11.5–15.5)
WBC: 5.3 10*3/uL (ref 4.0–10.5)
nRBC: 0 % (ref 0.0–0.2)

## 2022-02-02 NOTE — Progress Notes (Signed)
PROGRESS NOTE    Pt reading, request to go to bathroom but ok to try urinal .  Was able to handle urinal but unable to void at this time   ROS: Limited due to cognitive/behavioral   Objective:   No results found. Recent Labs    02/01/22 0617 02/02/22 0641  WBC 5.1 5.3  HGB 9.4* 8.6*  HCT 29.4* 27.1*  PLT 273 270       Recent Labs    02/01/22 0617  NA 140  K 4.2  CL 104  CO2 26  GLUCOSE 97  BUN 21  CREATININE 0.83  CALCIUM 8.8*         Intake/Output Summary (Last 24 hours) at 02/02/2022 0939 Last data filed at 02/02/2022 0433 Gross per 24 hour  Intake 0 ml  Output 200 ml  Net -200 ml                Physical Exam: Vital Signs Blood pressure (!) 102/59, pulse (!) 59, temperature 98.2 F (36.8 C), temperature source Oral, resp. rate 15, height 5\' 10"  (1.778 m), weight 88.3 kg, SpO2 95 %.    General: No acute distress Mood and affect are appropriate Heart: Regular rate and rhythm no rubs murmurs or extra sounds Lungs: Clear to auscultation, breathing unlabored, no rales or wheezes Abdomen: Positive bowel sounds, soft nontender to palpation, nondistended Extremities: No clubbing, cyanosis, or edema Skin: No evidence of breakdown, no evidence of rash   Neuro:  .alert, oriented to person, [place], speech clearer. Was able to tell me about the book he was reading. Ataxic limbs. Moves all 4's. No resting tone. Sensory exam grossly intact.     Musculoskeletal: no jt pain   Assessment/Plan: 1. Functional deficits which require 3+ hours per day of interdisciplinary therapy in a comprehensive inpatient rehab setting. Physiatrist is providing close team supervision and 24 hour management of active medical problems listed below. Physiatrist and rehab team continue to assess barriers to discharge/monitor patient progress toward functional and medical goals  Care Tool:  Bathing    Body  parts bathed by patient: Right arm, Left arm, Right lower leg, Left lower leg, Face, Chest, Abdomen, Front perineal area, Buttocks, Right upper leg, Left upper leg   Body parts bathed by helper: Right arm, Left arm, Chest, Abdomen     Bathing assist Assist Level: Minimal Assistance - Patient > 75%     Upper Body Dressing/Undressing Upper body dressing   What is the patient wearing?: Pull over shirt    Upper body assist Assist Level: Supervision/Verbal cueing    Lower Body Dressing/Undressing Lower body dressing      What is the patient wearing?: Pants, Incontinence brief     Lower body assist Assist for lower body dressing: Moderate Assistance - Patient 50 - 74%     Toileting Toileting    Toileting assist Assist for toileting: Moderate Assistance - Patient 50 - 74%     Transfers Chair/bed transfer  Transfers assist     Chair/bed transfer assist level: Minimal Assistance - Patient > 75% Chair/bed transfer assistive device: Programmer, multimedia   Ambulation assist   Ambulation activity  did not occur: Safety/medical concerns  Assist level: Minimal Assistance - Patient > 75% Assistive device: Walker-rolling Max distance: 200 ft   Walk 10 feet activity   Assist  Walk 10 feet activity did not occur: Safety/medical concerns  Assist level: Minimal Assistance - Patient > 75% Assistive device: Walker-rolling   Walk 50 feet activity   Assist Walk 50 feet with 2 turns activity did not occur: Safety/medical concerns  Assist level: Minimal Assistance - Patient > 75% Assistive device: Walker-rolling    Walk 150 feet activity   Assist Walk 150 feet activity did not occur: Safety/medical concerns  Assist level: Minimal Assistance - Patient > 75% Assistive device: Walker-rolling    Walk 10 feet on uneven surface  activity   Assist Walk 10 feet on uneven surfaces activity did not occur: Safety/medical concerns          Wheelchair     Assist Is the patient using a wheelchair?: Yes Type of Wheelchair: Manual Wheelchair activity did not occur: Safety/medical concerns (limited by cognitive deficits)  Wheelchair assist level: Supervision/Verbal cueing Max wheelchair distance: 57 ft    Wheelchair 50 feet with 2 turns activity    Assist    Wheelchair 50 feet with 2 turns activity did not occur: Safety/medical concerns   Assist Level: Supervision/Verbal cueing   Wheelchair 150 feet activity     Assist  Wheelchair 150 feet activity did not occur: Safety/medical concerns       Blood pressure (!) 102/59, pulse (!) 59, temperature 98.2 F (36.8 C), temperature source Oral, resp. rate 15, height 5\' 10"  (1.778 m), weight 88.3 kg, SpO2 95 %.    Medical Problem List and Plan: 1. 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  Status post posterior decompression with craniectomy 11/26/2021             -patient may not yet shower             -ELOS/Goals:   2/21---  supervision to min assist goals   - still requiring net bed for safety , not agitated thsi am   -Continue CIR therapies including PT, OT, and SLP    -needs 2 person assist to bathroom for safety, enclosure bed   2.  Antithrombotics: -DVT/anticoagulation:  Pharmaceutical: Other (comment) Eliquis             -antiplatelet therapy: N/A 3. Pain Management: Tizanidine no longer ordered, Voltaren gel 4 times daily, Fioricet as needed, oxycodone as needed 4. Mood/sleep/behavior:                 -antipsychotic agents: seroquel               - sleep chart    - currently seroquel 50 mg qam and 150mg  qhs  -continue HS celexa for sleep and mood stabilization  -rx'ed UTI 2/17 -- HS agitation/confusion -rx uti  -increased celexa to 20mg  qhs  -Continue seroquel dosing with same schedule   25mg -25mg -100mg  daily  - enclosure bed for safety, no waist belt except  in w/c 5. Neuropsych: This patient is not capable of making decisions on his own behalf.  - telesitter ongoing-  6. Skin/Wound Care: Routine skin checks 7. Fluids/Electrolytes/Nutrition:   -IR replaced foley with new balloon bumper PEG yesterday. Appreciate help  - continue bolus feeds  -labs for 2/20 8.  Prolonged ventilatory support.  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     Decannulated 1/16- stoma  closed 9.  Dysphagia.  Status post gastrostomy tube 12/13/2021 per Dr.Lovick-  -continue NPO, trials with SLP only. 10.  Aspiration pneumonia.  Antibiotic therapy completed.  -afebrile  -oob, oxygenation improved 11.  New onset atrial fibrillation.  Started Eliquis 12/17/2021.  Continue low-dose beta-blocker.    HR controlled  -appreciate cardiology consult.   2/17 continued hematuria and clots   -low dose eliquis stopped 2/9   -off asa and plavix now   -will not attempt to resume anticoagulants/antiplatelets   -pt will see Dr. Quentin Ore in office to discuss [facility] as outpt    Vitals:   02/01/22 2032 02/02/22 0341  BP: 130/65 (!) 102/59  Pulse: 65 (!) 59  Resp: 15 15  Temp: 98.4 F (36.9 C) 98.2 F (36.8 C)  SpO2: 98% 95%    12.  Hypertension.  Continue Norvasc 10 mg daily, Cozaar 50 mg daily, , clonidine 0.1 mg every 8 hours.  Controlled 2/18 13.  Hyperlipidemia.  Lipitor 14.  History of BPH/urinary retention.        -generally has been voiding but is incontinent 15.  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  Follow-up per cardiology services. 16. 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   -continue   keflex for 3 more days   -hgb down to 9.4---recheck 2/18, 8.6 asymptomatic     --urology has seen, not a lot of options ?finasteride if can take po                   LOS: 39 days A FACE TO FACE EVALUATION WAS PERFORMED  Charlett Blake 02/02/2022, 9:39 AM

## 2022-02-02 NOTE — Progress Notes (Signed)
Speech Language Pathology Daily Session Note  Patient Details  Name: [name] MRN: [id number] Date of Birth: [birth year]  Today's Date: 02/02/2022 SLP Individual Time: 1100-1200 SLP Individual Time Calculation (min): 60 min  Short Term Goals: Week 6: SLP Short Term Goal 1 (Week 6): STGs=LTGs due to ELOS  Skilled Therapeutic Interventions: Skilled treatment session focused on cognitive and dysphagia goals. Upon arrival, patient was awake in the enclosure bed. Patient had recently used the urinal independently as it was filled with dark red urine. Patient's brief was also filled with red urine. SLP donned a new brief, pants, TED hoes and socks due to time constraints. Patient transferred to the wheelchair and completed basic self-care tasks at the sink. Patient consumed trials of ice chips without overt s/s of aspiration and trials of thin liquids via tsp with overt s/s of aspiration in 25% of trials, suspect due to decreased awareness to bolus. Patient utilized swallowing compensatory strategies with Min verbal cues. Throughout session, patient demonstrated increased attention to tasks as well as increased intellectual awareness of deficits. Patient also with increased ability to utilize cues when given. Patient was oriented to place and situation with Min verbal cues needed for orientation to time. Patient left at RN station with soft waist belt in place. Continue with current plan of care.      Pain No/Denies Pain   Therapy/Group: Individual Therapy  Macil Crady 02/02/2022, 12:17 PM

## 2022-02-02 NOTE — Progress Notes (Signed)
This nurse was notified by CNA of family questioning the ability of staff to open the side of the enclosure bed as it was obscuring family view of the patient. This nurse educated family and therapy staff about the policy of restraints and the inability of this nurse to grant the request of the patient. The orders for enclosure bed restraint followed per policy. Q2 hour toileting and monitoring checks completed per restraint flow sheet. MD on call, Charge nurse  and Ladene Artist notified about family  complaint.

## 2022-02-03 NOTE — Progress Notes (Addendum)
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 Recent Labs    02/01/22 0617 02/02/22 0641  WBC 5.1 5.3  HGB 9.4* 8.6*  HCT 29.4* 27.1*  PLT 273 270       Recent Labs    02/01/22 0617  NA 140  K 4.2  CL 104  CO2 26  GLUCOSE 97  BUN 21  CREATININE 0.83  CALCIUM 8.8*        No intake or output data in the 24 hours ending 02/03/22 1155              Physical Exam: Vital Signs Blood pressure 131/65, pulse 81, temperature 98.2 F (36.8 C), temperature source Oral, resp. rate 19, height 5\' 10"  (1.778 m), weight 88.3 kg, SpO2 100 %.    General: No acute distress Mood and affect are appropriate Heart: Regular rate and rhythm no rubs murmurs or extra sounds Lungs: Clear to auscultation, breathing unlabored, no rales or wheezes Abdomen: Positive bowel sounds, soft nontender to palpation, nondistended Extremities: No clubbing, cyanosis, or edema Skin: No evidence of breakdown, no evidence of rash   Neuro:  .alert, oriented to person, [place], speech clearer. Was able to tell me about the book he was reading. Ataxic limbs. Moves all 4's. No resting tone. Sensory exam grossly intact.     Musculoskeletal: no jt pain   Assessment/Plan: 1. Functional deficits which require 3+ hours per day of interdisciplinary therapy in a comprehensive inpatient rehab setting. Physiatrist is providing close team supervision and 24 hour management of active medical problems listed below. Physiatrist and rehab team continue to assess barriers to discharge/monitor patient progress toward functional and medical goals  Care Tool:  Bathing    Body parts bathed by patient: Right arm, Left arm, Right lower leg, Left lower leg, Face, Chest, Abdomen, Front perineal area, Buttocks, Right upper leg,  Left upper leg   Body parts bathed by helper: Right arm, Left arm, Chest, Abdomen     Bathing assist Assist Level: Minimal Assistance - Patient > 75%     Upper Body Dressing/Undressing Upper body dressing   What is the patient wearing?: Pull over shirt    Upper body assist Assist Level: Supervision/Verbal cueing    Lower Body Dressing/Undressing Lower body dressing      What is the patient wearing?: Pants, Incontinence brief     Lower body assist Assist for lower body dressing: Moderate Assistance - Patient 50 - 74%     Toileting Toileting    Toileting assist Assist for toileting: Moderate Assistance - Patient 50 - 74%     Transfers Chair/bed transfer  Transfers assist     Chair/bed transfer assist level: Minimal Assistance - Patient > 75% Chair/bed transfer assistive device: Programmer, multimedia   Ambulation assist   Ambulation activity did not occur: Safety/medical concerns  Assist level: Minimal Assistance - Patient > 75% Assistive device: Walker-rolling Max distance: 200 ft   Walk 10 feet activity   Assist  Walk 10 feet activity did not occur:  Safety/medical concerns  Assist level: Minimal Assistance - Patient > 75% Assistive device: Walker-rolling   Walk 50 feet activity   Assist Walk 50 feet with 2 turns activity did not occur: Safety/medical concerns  Assist level: Minimal Assistance - Patient > 75% Assistive device: Walker-rolling    Walk 150 feet activity   Assist Walk 150 feet activity did not occur: Safety/medical concerns  Assist level: Minimal Assistance - Patient > 75% Assistive device: Walker-rolling    Walk 10 feet on uneven surface  activity   Assist Walk 10 feet on uneven surfaces activity did not occur: Safety/medical concerns         Wheelchair     Assist Is the patient using a wheelchair?: Yes Type of Wheelchair: Manual Wheelchair activity did not occur: Safety/medical concerns (limited by  cognitive deficits)  Wheelchair assist level: Supervision/Verbal cueing Max wheelchair distance: 57 ft    Wheelchair 50 feet with 2 turns activity    Assist    Wheelchair 50 feet with 2 turns activity did not occur: Safety/medical concerns   Assist Level: Supervision/Verbal cueing   Wheelchair 150 feet activity     Assist  Wheelchair 150 feet activity did not occur: Safety/medical concerns       Blood pressure 131/65, pulse 81, temperature 98.2 F (36.8 C), temperature source Oral, resp. rate 19, height 5\' 10"  (1.778 m), weight 88.3 kg, SpO2 100 %.    Medical Problem List and Plan: 1. 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  Status post posterior decompression with craniectomy 11/26/2021             -patient may not yet shower             -ELOS/Goals:   2/21---  supervision to min assist goals   - still requiring net bed for safety , not agitated thsi am   -Continue CIR therapies including PT, OT, and SLP    -needs 2 person assist to bathroom for safety, enclosure bed   2.  Antithrombotics: -DVT/anticoagulation:  Pharmaceutical: Other (comment) Eliquis             -antiplatelet therapy: N/A 3. Pain Management: Tizanidine no longer ordered, Voltaren gel 4 times daily, Fioricet as needed, oxycodone as needed 4. Mood/sleep/behavior:                 -antipsychotic agents: seroquel               - sleep chart    - currently seroquel 50 mg qam and 150mg  qhs  -continue HS celexa for sleep and mood stabilization  -rx'ed UTI 2/17 -- HS agitation/confusion -rx uti  -increased celexa to 20mg  qhs  -Continue seroquel dosing with same schedule   25mg -25mg -100mg  daily  - enclosure bed for safety, no waist belt except in w/c- team to decide when this can be d/ced 5. Neuropsych: This patient is not capable of making decisions on his own behalf.  - telesitter ongoing-  6. Skin/Wound Care:  Routine skin checks 7. Fluids/Electrolytes/Nutrition:   -IR replaced foley with new balloon bumper PEG yesterday. Appreciate help  - continue bolus feeds  -labs for 2/20 8.  Prolonged ventilatory support.  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     Decannulated 1/16- stoma closed 9.  Dysphagia.  Status post gastrostomy tube 12/13/2021 per Dr.Lovick-  -continue NPO, trials with SLP only. 10.  Aspiration pneumonia.  Antibiotic therapy completed.  -afebrile  -oob, oxygenation  improved 11.  New onset atrial fibrillation.  Started Eliquis 12/17/2021.  Continue low-dose beta-blocker.    HR controlled  -appreciate cardiology consult.   2/17 continued hematuria and clots   -low dose eliquis stopped 2/9   -off asa and plavix now   -will not attempt to resume anticoagulants/antiplatelets   -pt will see Dr. Quentin Ore in office to discuss [facility] as outpt    Vitals:   02/03/22 0437 02/03/22 0628  BP: (!) 110/59 131/65  Pulse: 73 81  Resp: 19 19  Temp: 98.2 F (36.8 C) 98.2 F (36.8 C)  SpO2: 100% 100%    12.  Hypertension.  Continue Norvasc 10 mg daily, Cozaar 50 mg daily, , clonidine 0.1 mg every 8 hours.  Controlled 2/18 13.  Hyperlipidemia.  Lipitor 14.  History of BPH/urinary retention.        -generally has been voiding but is incontinent 15.  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  Follow-up per cardiology services. 16. 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   -continue   keflex for 3 more days   -hgb down to 9.4---recheck 2/18, 8.6 asymptomatic , recheck 7.8, will transfuse if <7.0,     --urology has seen, radiation cystitis, outpt HBO, will need to f/u with urologist at [facility]                  LOS: 40 days A FACE TO [place] 02/03/2022, 11:55 AM

## 2022-02-03 NOTE — Progress Notes (Signed)
Physical Therapy TBI Note  Patient Details  Name: [name] MRN: [id number] Date of Birth: 1946-04-04  Today's Date: 02/03/2022 PT Individual Time: 0800-0900, 1043-1100, and 1345-1400 PT Individual Time Calculation (min): 60 min, 17 min, and 15 min  Short Term Goals: Week 5:  PT Short Term Goal 1 (Week 5): STG=LTG due to ELOS  Skilled Therapeutic Interventions/Progress Updates:     Session 1: Patient in secured enclosure bed upon PT arrival. Patient alert and agreeable to PT session. Patient reported un-rated B foot pain with numbness at beginning of session session, LPN made aware. PT provided repositioning, rest breaks, and distraction as pain interventions throughout session.   Patient noted to have thigh high TEDs donned and banded around his calves and ankles due to adjusting throughout the night. Removed non-skid socks and TEDs with total A with instant relief from patient with improved sensation <2 min. Noted a open blister to the patient's distal end of his L 1st toe and red/purple bruising to R medial 5th toe, LPN made aware. Provided foot care including washing and debriding dead skin with a wash cloth, performing skin inspection, and applying lotion with total A and education to patient. Patient unable to tolerate bending over for long period to perform task himself, so provided total A and educated on having a caregiver perform this for him at home, patient in agreement. Patient applied lotion to B legs after. Donned clean thigh high TEDS with total A non-skid socks due to patient reporting urinary urgency.   Patient noted to be incontinent of bladder once standing, noted patient's brief and enclosure bed to be saturated in urine, NT and LPN made aware. Noted dark black color in patient's brief with hematuria LPN made aware.   Therapeutic Activity: Bed Mobility: Patient performed supine to/from sit with supervision and cues for use of bottom elbow to push up to sitting and  opposite shoulder to come forward to reduced posterior bias. Transfers: Patient performed sit to/from stand from the bed, BSC over the toilet x3, and TIS w/c with min A progressing to CGA with repetition using a RW. Provided verbal cues for hand placement on RW, forward weight shift, and reaching back to sit, minimal carryover between trials. Patient performed in room ambulatory transfer bed>BSC over toilet, toilet to sink, and sink to TIS w/c with min A-CGA using RW with mod cues for impulsivity and safe use of RW due to lifting it off the floor and overshooting turns.  Patient with incontinent and continent of bladder during toileting. Performed anterior peri-care with set-up assist and min cues and posterior peri-care with total A due to balance deficits. Focused on dynamic sitting and static standing balance during peri-care and dressing with CGA-supervision. Encouraged use of R hand during seated dressing for improved motor control and coordination due to limb ataxia. Patient doffed/donned incontinence brief with max A and pants with supervision and mod-min cues for technique. Pulled brief and pants over hips in standing with single upper extremity support and mod A for dressing.  Patient unable to return to enclosure bed due to it needing to it needing to be cleaned and dry. Patient left in TIS w/c with LPN for one-on-one supervision while OOB.  ABS: 17  Session 2: Patient in secured enclosure bed upon PT arrival. Patient alert and agreeable to PT session for improved patient affect and stimulation with limitations in enclosure bed. Patient denied pain during session.  Therapeutic Activity: Bed Mobility: Patient performed supine to/from sit with supervision  as above.  Transfers: Patient performed stand pivot bed>w/c with CGA using RW and sit to/from stand from an arm chair with blocked progressing to random practice 2x5 and 1x3. Provided verbal cues as above with improved carry-over following  blocked practice trials.  Gait Training:  Patient ambulated ~100 feet with 2x90 deg turns and 50 feet with 2x180 deg turns with CGA and min A x1 on each turn. He then ambulated 25 feet while weaving between 6 cones x2 using RW with CGA and intermittent min A with impulsivity on turns feet. Focused on maintaining RW close and on the ground during turns with good carry-over of PT demonstration of weaving between trials.   Patient in secured enclosure bed at end of session with breaks locked and all needs within reach. Patient declined need for toileting throughout session.  ABS: 16  Session 3: Returned to patient room with his son, [name], present with questions about his father's progress. Patient in secured enclosure bed upon PT arrival. Provided updates on progress of patient's behaviors and mobility with significant improvement over the past few days. Expressed concerns about patient's hemoglobin and follow-up cardiology visit, differed to medical team at this time, LPN made aware. Patient requested to void urgently. Patient performed mobility as above. He ambulated with the RW to the bathroom and was continent of bladder and bowl with continued hematuria and blood clots in urine, LPN aware. Noted continued improvement in balance and mobility with good carry-over of cues from previous sessions. Patient returned to bed and enclosure bed secured with his son at bedside.   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

## 2022-02-04 LAB — CBC
HCT: 25.3 % — ABNORMAL LOW (ref 39.0–52.0)
Hemoglobin: 8 g/dL — ABNORMAL LOW (ref 13.0–17.0)
MCH: 30 pg (ref 26.0–34.0)
MCHC: 31.6 g/dL (ref 30.0–36.0)
MCV: 94.8 fL (ref 80.0–100.0)
Platelets: 281 10*3/uL (ref 150–400)
RBC: 2.67 MIL/uL — ABNORMAL LOW (ref 4.22–5.81)
RDW: 15.3 % (ref 11.5–15.5)
WBC: 5.2 10*3/uL (ref 4.0–10.5)
nRBC: 0 % (ref 0.0–0.2)

## 2022-02-04 LAB — BASIC METABOLIC PANEL
Anion gap: 8 (ref 5–15)
BUN: 17 mg/dL (ref 8–23)
CO2: 28 mmol/L (ref 22–32)
Calcium: 8.6 mg/dL — ABNORMAL LOW (ref 8.9–10.3)
Chloride: 104 mmol/L (ref 98–111)
Creatinine, Ser: 0.82 mg/dL (ref 0.61–1.24)
GFR, Estimated: >60 mL/min (ref >60)
Glucose, Bld: 96 mg/dL (ref 70–99)
Potassium: 4.1 mmol/L (ref 3.5–5.1)
Sodium: 140 mmol/L (ref 135–145)

## 2022-02-04 NOTE — Progress Notes (Signed)
PROGRESS NOTE  Discussed with patient extension of stay until Friday as wife does not feel comfortable taking him home yet given anemia. 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    ROS: Limited due to cognitive/behavioral   Objective:   No results found. Recent Labs    02/02/22 0641 02/04/22 0516  WBC 5.3 5.2  HGB 8.6* 8.0*  HCT 27.1* 25.3*  PLT 270 281      Recent Labs    02/04/22 0516  NA 140  K 4.1  CL 104  CO2 28  GLUCOSE 96  BUN 17  CREATININE 0.82  CALCIUM 8.6*        Intake/Output Summary (Last 24 hours) at 02/04/2022 1201 Last data filed at 02/03/2022 2200 Gross per 24 hour  Intake --  Output 0 ml  Net 0 ml               Physical Exam: Vital Signs Blood pressure 126/62, pulse 63, temperature 98.2 F (36.8 C), temperature source Oral, resp. rate 18, height 5\' 10"  (1.778 m), weight 88.3 kg, SpO2 95 %.  Gen: no distress, normal appearing HEENT: oral mucosa pink and moist, NCAT Cardio: Reg rate Chest: normal effort, normal rate of breathing Abd: soft, non-distended Ext: no edema Psych: pleasant, normal affect Skin: intact   Neuro:  .alert, oriented to person, [place], speech clearer. Was able to tell me about the book he was reading. Ataxic limbs. Moves all 4's. No resting tone. Sensory exam grossly intact.     Musculoskeletal: no jt pain   Assessment/Plan: 1. Functional deficits which require 3+ hours per day of interdisciplinary therapy in a comprehensive inpatient rehab setting. Physiatrist is providing close team supervision and 24 hour management of active medical problems listed below. Physiatrist and rehab team continue to assess barriers to discharge/monitor patient progress toward functional and medical goals  Care Tool:  Bathing    Body parts bathed by patient: Right arm, Left arm, Right lower leg, Left lower leg, Face,  Chest, Abdomen, Front perineal area, Buttocks, Right upper leg, Left upper leg   Body parts bathed by helper: Right arm, Left arm, Chest, Abdomen     Bathing assist Assist Level: Minimal Assistance - Patient > 75%     Upper Body Dressing/Undressing Upper body dressing   What is the patient wearing?: Pull over shirt    Upper body assist Assist Level: Supervision/Verbal cueing    Lower Body Dressing/Undressing Lower body dressing      What is the patient wearing?: Pants, Incontinence brief     Lower body assist Assist for lower body dressing: Moderate Assistance - Patient 50 - 74%     Toileting Toileting    Toileting assist Assist for toileting: Moderate Assistance - Patient 50 - 74%     Transfers Chair/bed transfer  Transfers assist     Chair/bed transfer assist level: Minimal Assistance - Patient > 75% Chair/bed transfer assistive device: Programmer, multimedia   Ambulation assist   Ambulation activity did not occur: Safety/medical concerns  Assist level: Minimal Assistance - Patient > 75% Assistive device: Walker-rolling Max distance: 200 ft  Walk 10 feet activity   Assist  Walk 10 feet activity did not occur: Safety/medical concerns  Assist level: Minimal Assistance - Patient > 75% Assistive device: Walker-rolling   Walk 50 feet activity   Assist Walk 50 feet with 2 turns activity did not occur: Safety/medical concerns  Assist level: Minimal Assistance - Patient > 75% Assistive device: Walker-rolling    Walk 150 feet activity   Assist Walk 150 feet activity did not occur: Safety/medical concerns  Assist level: Minimal Assistance - Patient > 75% Assistive device: Walker-rolling    Walk 10 feet on uneven surface  activity   Assist Walk 10 feet on uneven surfaces activity did not occur: Safety/medical concerns         Wheelchair     Assist Is the patient using a wheelchair?: Yes Type of Wheelchair:  Manual Wheelchair activity did not occur: Safety/medical concerns (limited by cognitive deficits)  Wheelchair assist level: Supervision/Verbal cueing Max wheelchair distance: 57 ft    Wheelchair 50 feet with 2 turns activity    Assist    Wheelchair 50 feet with 2 turns activity did not occur: Safety/medical concerns   Assist Level: Supervision/Verbal cueing   Wheelchair 150 feet activity     Assist  Wheelchair 150 feet activity did not occur: Safety/medical concerns       Blood pressure 126/62, pulse 63, temperature 98.2 F (36.8 C), temperature source Oral, resp. rate 18, height 5\' 10"  (1.778 m), weight 88.3 kg, SpO2 95 %.    Medical Problem List and Plan: 1. 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  Status post posterior decompression with craniectomy 11/26/2021             -patient may not yet shower             -ELOS/Goals:  --  supervision to min assist goals   - still requiring net bed for safety , not agitated thsi am   -Continue CIR therapies including PT, OT, and SLP, extend until Friday, left message for wife to discuss her concerns.   -needs 2 person assist to bathroom for safety, enclosure bed   2.  Antithrombotics: -DVT/anticoagulation:  Pharmaceutical: Other (comment) Eliquis             -antiplatelet therapy: N/A 3. Pain Management: Tizanidine no longer ordered, Voltaren gel 4 times daily, Fioricet as needed, oxycodone as needed 4. Mood/sleep/behavior:                 -antipsychotic agents: seroquel               - sleep chart    - currently seroquel 50 mg qam and 150mg  qhs  -continue HS celexa for sleep and mood stabilization  -rx'ed UTI 2/17 -- HS agitation/confusion -rx uti  -increased celexa to 20mg  qhs  -Continue seroquel dosing with same schedule   25mg -25mg -100mg  daily  - enclosure bed for safety, no waist belt except in w/c- team to decide when this can  be d/ced 2/20: messaged team regarding whether continued posey bed is necessary 5. Neuropsych: This patient is not capable of making decisions on his own behalf.  - telesitter ongoing-  6. Skin/Wound Care: Routine skin checks 7. Fluids/Electrolytes/Nutrition:   -IR replaced foley with new balloon bumper PEG yesterday. Appreciate help  - continue bolus feeds  -labs for 2/20 8.  Prolonged ventilatory support.  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     Decannulated  1/16- stoma closed 9.  Dysphagia.  Status post gastrostomy tube 12/13/2021 per Dr.Lovick-  -continue NPO, trials with SLP only. 10.  Aspiration pneumonia.  Antibiotic therapy completed.  -afebrile  -oob, oxygenation improved 11.  New onset atrial fibrillation.  Started Eliquis 12/17/2021.  Continue low-dose beta-blocker.    HR controlled  -appreciate cardiology consult.   2/17 continued hematuria and clots   -low dose eliquis stopped 2/9   -off asa and plavix now   -will not attempt to resume anticoagulants/antiplatelets   -pt will see Dr. Quentin Ore in office to discuss [facility] as outpt    Vitals:   02/03/22 2018 02/04/22 0534  BP: 122/62 126/62  Pulse: 70 63  Resp: 18 18  Temp: 98.5 F (36.9 C) 98.2 F (36.8 C)  SpO2: 100% 95%    12.  Hypertension.  continue Norvasc 10 mg daily, Cozaar 50 mg daily, , clonidine 0.1 mg every 8 hours.  Controlled 2/20 13.  Hyperlipidemia.  Lipitor 14.  History of BPH/urinary retention.        -generally has been voiding but is incontinent 15.  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  Follow-up per cardiology services. 16. 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   -continue   keflex for 3 more days   -hgb down to 9.4---recheck 2/18, 8.6 asymptomatic , recheck 7.8, will transfuse  if <7.0,     --urology has seen, radiation cystitis, outpt HBO, will need to f/u with urologist at [facility]  -called wife to update her/answer her questions regarding the hematuria/hgb/blood checks at home.                   LOS: 41 days A FACE TO FACE EVALUATION WAS PERFORMED  Martha Clan P Zameer Borman 02/04/2022, 12:01 PM

## 2022-02-04 NOTE — Progress Notes (Signed)
Physical Therapy TBI Note  Patient Details  Name: [name] MRN: [id number] Date of Birth: May 19, 1946  Today's Date: 02/04/2022 PT Individual Time: 0800-0900  Short Term Goals: Week 5:  PT Short Term Goal 1 (Week 5): STG=LTG due to ELOS  Skilled Therapeutic Interventions/Progress Updates:     Patient in secured enclosure bed calling for assist for toileting upon PT arrival. Patient alert and agreeable to PT session. Patient reported un-rated B foot, mostly heel, pain with numbness at beginning of session session, RN made aware. PT provided repositioning, rest breaks, and distraction as pain interventions throughout session.   Patient noted to have thigh high TEDs donned and banded around his calves and ankles due to adjusting throughout the night. Removed non-skid socks and TEDs with total A with instant relief from patient with improved sensation <2 min. Noted the blister to the patient's distal end of his L 1st toe and red/purple bruising to R medial 5th toe looked better today, however new blanchable redness noted with TTP on B heels L>R. PT placed B foam heel dressings to reduce skin breakdown, RN made aware and in agreement. Donned clean thigh high TEDS and non-skid socks with max A for energy/time management.  Patient noted to be incontinent of bladder once sitting EOB, noted patient's brief and enclosure bed to be saturated in urine, NT and RN made aware.   Therapeutic Activity: Bed Mobility: Patient performed supine to/from sit with supervision and cues for use of bottom elbow to push up to sitting and opposite shoulder to come forward to reduced posterior bias. Transfers: Patient performed stand pivot bed>BSC and BSC>TIS w/c without reporting symptoms without TED hose. He performed sit to/from stand from TIS w/c x2 with CGA using a RW. Utilized teach back method for hand placement on RW, forward weight shift, and reaching back to sit, with good carry-over from yesterday. Patient was  incontinent and continent of urine during toileting, continued bright red hematuria, RN aware. Performed anterior peri-care with set-up assist and min cues and posterior peri-care with total A due to balance deficits. Focused on dynamic sitting and static standing balance during peri-care and dressing with CGA-supervision. Encouraged use of R hand during seated dressing for improved motor control and coordination due to limb ataxia. Patient doffed/donned incontinence brief with max A and pants with supervision and min cues for technique. Pulled brief and pants over hips in standing without upper extremity support and min A for dressing and balance.  Assessed vitals during activities above to determine continued need for thigh high TED hose. Orthostatic Vitals: Without TED hose: Sitting: BP 122/66, HR 72 Standing: BP 96/53, HR 75 (symptomatic, pt requesting to sit) With TED hose:  Sitting: BP 93/59, HR 74 (asymptomatic) Standing: unable to retrieve reading due to equipment error, patient with mild symptoms and requesting to sit RN and NT made aware of vitals following session, recommending patient continue with thigh high TED hose at this time.  Patient in [place] w/c with soft waist belt restraint seated at the nurses station and handed off to the nurse secretary at for one-on-one supervision.    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    Therapy/Group: Individual Therapy  Psalms Olarte L Lashun Mccants PT, DPT  02/04/2022, 12:31 PM

## 2022-02-04 NOTE — Progress Notes (Signed)
Patient ID: [name], male   DOB: 1946-01-22, 76 y.o.   MRN: [id number]  Pt has been extended to Friday to address medical concerns and continued rehab needed. SW spoke with [name] to discuss above. She is amenable to d/c on Friday. Would like to know how to check his blood levels once he is discharged home. Also confirms that TF were received in mail by [employer].   [name], MSW, [place] Office: [phone] Cell: [phone] Fax: [phone]

## 2022-02-04 NOTE — Progress Notes (Signed)
Occupational Therapy Session Note  Patient Details  Name: [name] MRN: [id number] Date of Birth: 1946-06-15  Today's Date: 02/04/2022 OT Individual Time: 1345-1510 OT Individual Time Calculation (min): 85 min    Short Term Goals: Week 6:  OT Short Term Goal 1 (Week 6): Continued progressing toward established LTG  Skilled Therapeutic Interventions/Progress Updates:    Pt received sitting in the TIS w/c with no c/o pain, reporting some fatigue. He was taken via w/c to the therapy gym. Ambulatory transfer to the mat, 15 ft, with RW with CGA overall. He took several seated rest breaks throughout session d/t increased fatigue (likely from low Hgb). Pt completed standing level dynamic balance activity- stepping forward and completing simple math computations with min-mod A required for balance. No cueing for simple math addition. Pt then completed seated theraband BUE strengthening circuit with a level 2 theraband. Cueing for body mechanics required. Focus on triceps, lats, rhomboids, and pecs. 3x10 repetitions. Pt then held a 2 kg medicine ball and completed trunk rotations with BUE on the ball for UE coordination 3x 15 repetitions. Pt then completed blocked practice sit <> stands without UE support- holding a 2 kg medicine ball for addressing cardiovascular endurance, functional activity tolerance, and standing balance. He returned to his room for toileting tasks. Pt had continent void of urine. Upon standing for hygiene pt became pale and reported feeling dizzy. He quickly sat back down and OT provided leg elevation and cueing for ankle pumps d/t suspicion of BP drop. He began vomiting. BP checked once pt done vomiting and it was 120/65. LPN reporting pt had bolus feed right before OT session. He transferred back to the TIS w/c. He was left sitting up with all needs met, posey waist belt on and his sister present.   Therapy Documentation Precautions:  Precautions Precautions: Fall, Other  (comment) Precaution Comments: PEG, B wrist restraints, trach removed on 1/16, truncal ataxia Restrictions Weight Bearing Restrictions: No Other Position/Activity Restrictions: Wears a lift in R shoe s/p hip replacement on L causing leg length discrepency  Therapy/Group: Individual Therapy  [name] 02/04/2022, 6:29 AM

## 2022-02-04 NOTE — Progress Notes (Incomplete)
Physical Therapy Session Note  Patient Details  Name: [name] MRN: [id number] Date of Birth: [birth year]  Today's Date: 02/04/2022 PT Individual Time:  -      Short Term Goals: Week 5:  PT Short Term Goal 1 (Week 5): STG=LTG due to ELOS  Skilled Therapeutic Interventions/Progress Updates:     Patient in *** upon PT arrival. Patient alert and agreeable to PT session. Patient denied pain during session.  Therapeutic Activity: Bed Mobility: Patient performed supine to/from sit with ***. Provided verbal cues for ***. Transfers: Patient performed sit to/from stand x*** with ***. Provided verbal cues for***.  Gait Training:  Patient ambulated *** feet using *** with ***. Ambulated with ***. Provided verbal cues for ***.  Wheelchair Mobility:  Patient propelled wheelchair *** feet with ***. Provided verbal cues for ***  Neuromuscular Re-ed: Patient performed the following *** activities: ***  Therapeutic Exercise: Patient performed the following exercises with verbal and tactile cues for proper technique. ***  Patient in *** at end of session with breaks locked, *** alarm set, and all needs within reach.   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 General:   Vital Signs: Therapy Vitals Temp: 98.2 F (36.8 C) Temp Source: Oral Pulse Rate: 63 Resp: 18 BP: 126/62 Patient Position (if appropriate): Lying Oxygen Therapy SpO2: 95 % O2 Device: Room Air Pain:   Mobility:   Locomotion :    Trunk/Postural Assessment :    Balance:   Exercises:   Other Treatments:      Therapy/Group: Individual Therapy  Avilene Marrin L Farin Buhman PT, DPT  02/04/2022, 6:23 AM

## 2022-02-04 NOTE — NC FL2 (Signed)
[place] LEVEL OF CARE SCREENING TOOL     IDENTIFICATION  Patient Name: [name] Birthdate: Feb 20, 1946 Sex: male Admission Date (Current Location): 12/25/2021  [place] and [place] Number:  [employer] and Address:  [facility], [place] [address], [place], [place] [address]      Provider Number:    Attending Physician Name and Address:  Meredith Staggers, MD  Relative Name and Phone Number:  Magdaleno Lortie  [phone]    Current Level of Care: Hospital Recommended Level of Care: [place] Prior Approval Number:    Date Approved/Denied:   PASRR Number: [id number]  Discharge Plan: SNF    Current Diagnoses: Patient Active Problem List   Diagnosis Date Noted   PAF (paroxysmal atrial fibrillation) (HCC)    Hematuria    Cerebellar infarction ([place]) 12/25/2021   Acute ischemic stroke (HCC)    Acute respiratory failure with hypoxia (HCC)    Endotracheal tube present    Acute hypercapnic respiratory failure (HCC)    Massive aspiration syndrome    Atrial flutter (HCC)    Cerebellar stroke ([place]) 11/25/2021    Orientation RESPIRATION BLADDER Height & Weight     Self, Time    Incontinent, Continent Weight: 194 lb 10.7 oz (88.3 kg) Height:  5\' 10"  (177.8 cm)  BEHAVIORAL SYMPTOMS/MOOD NEUROLOGICAL BOWEL NUTRITION STATUS      Continent Feeding tube (NPO PEG TRIALS)  AMBULATORY STATUS COMMUNICATION OF NEEDS Skin   Limited Assist Verbally Normal, Skin abrasions                       Personal Care Assistance Level of Assistance  Bathing, Feeding, Dressing Bathing Assistance: Maximum assistance Feeding assistance: Maximum assistance Dressing Assistance: Maximum assistance     Functional Limitations Info             SPECIAL CARE FACTORS FREQUENCY  PT (By licensed PT), OT (By licensed OT), Bowel and bladder program, Speech therapy     PT Frequency: 5x a week OT Frequency: 5x a week     Speech  Therapy Frequency: 5x a week      Contractures      Additional Factors Info                  Current Medications (02/04/2022):  This is the current hospital active medication list Current Facility-Administered Medications  Medication Dose Route Frequency Provider Last Rate Last Admin   acetaminophen (TYLENOL) tablet 650 mg  650 mg Oral Q4H PRN Cathlyn Parsons, PA-C   650 mg at 01/29/22 0746   Or   acetaminophen (TYLENOL) 160 MG/5ML solution 650 mg  650 mg Per Tube Q4H PRN Cathlyn Parsons, PA-C   650 mg at 01/26/22 2993   Or   acetaminophen (TYLENOL) suppository 650 mg  650 mg Rectal Q4H PRN Angiulli, Lavon Paganini, PA-C       atorvastatin (LIPITOR) tablet 80 mg  80 mg Per Tube Daily Cathlyn Parsons, PA-C   80 mg at 02/04/22 0910   butalbital-acetaminophen-caffeine (FIORICET) 50-325-40 MG per tablet 1 tablet  1 tablet Per Tube Q8H PRN Cathlyn Parsons, PA-C   1 tablet at 01/26/22 0838   cephALEXin (KEFLEX) 250 MG/5ML suspension 500 mg  500 mg Per Tube Q12H Meredith Staggers, MD   500 mg at 02/04/22 0914   chlorhexidine (PERIDEX) 0.12 % solution 15 mL  15 mL Mouth Rinse BID Angiulli, Lavon Paganini,  PA-C   15 mL at 02/04/22 0912   citalopram (CELEXA) tablet 20 mg  20 mg Per Tube QHS Meredith Staggers, MD   20 mg at 02/03/22 2206   diclofenac Sodium (VOLTAREN) 1 % topical gel 2 g  2 g Topical QID AngiulliLavon Paganini, PA-C   2 g at 02/04/22 0912   docusate (COLACE) 50 MG/5ML liquid 100 mg  100 mg Per Tube BID PRN Cathlyn Parsons, PA-C   100 mg at 01/09/22 2129   feeding supplement (JEVITY 1.5 CAL/FIBER) liquid 356 mL  356 mL Per Tube QID Meredith Staggers, MD   356 mL at 02/04/22 0536   feeding supplement (PROSource TF) liquid 45 mL  45 mL Per Tube TID Meredith Staggers, MD   45 mL at 02/04/22 0911   ferrous sulfate 220 (44 Fe) MG/5ML solution 220 mg  220 mg Per Tube Q breakfast Meredith Staggers, MD   220 mg at 02/04/22 3086   fiber (NUTRISOURCE FIBER) 1 packet  1 packet Per Tube BID  Cathlyn Parsons, PA-C   1 packet at 02/04/22 5784   free water 250 mL  250 mL Per Tube Q4H Alger Simons T, MD   250 mL at 02/04/22 0912   ipratropium-albuterol (DUONEB) 0.5-2.5 (3) MG/3ML nebulizer solution 3 mL  3 mL Nebulization Q6H PRN Angiulli, Lavon Paganini, PA-C       losartan (COZAAR) tablet 50 mg  50 mg Per Tube Daily Cathlyn Parsons, PA-C   50 mg at 02/04/22 0910   melatonin tablet 5 mg  5 mg Per Tube QHS PRN Cathlyn Parsons, PA-C   5 mg at 01/26/22 0007   ondansetron (ZOFRAN) injection 4 mg  4 mg Intramuscular Q6H PRN Cathlyn Parsons, PA-C   4 mg at 01/25/22 1042   oxyCODONE (Oxy IR/ROXICODONE) immediate release tablet 2.5 mg  2.5 mg Per Tube Q6H PRN Alger Simons T, MD   2.5 mg at 01/27/22 0856   pantoprazole sodium (PROTONIX) 40 mg/20 mL oral suspension 40 mg  40 mg Per Tube Daily AngiulliLavon Paganini, PA-C   40 mg at 02/04/22 0911   polyethylene glycol (MIRALAX / GLYCOLAX) packet 17 g  17 g Per Tube Daily PRN Cathlyn Parsons, PA-C   17 g at 01/08/22 6962   QUEtiapine (SEROQUEL) tablet 100 mg  100 mg Per Tube Q0600 Alger Simons T, MD   100 mg at 02/03/22 2207   QUEtiapine (SEROQUEL) tablet 25 mg  25 mg Per Tube Q0600 Meredith Staggers, MD   25 mg at 02/04/22 0535   QUEtiapine (SEROQUEL) tablet 25 mg  25 mg Per Tube Daily Meredith Staggers, MD   25 mg at 02/03/22 1522   QUEtiapine (SEROQUEL) tablet 50 mg  50 mg Per Tube Q12H PRN Meredith Staggers, MD   50 mg at 01/27/22 0126   scopolamine (TRANSDERM-SCOP) 1 MG/3DAYS 1.5 mg  1 patch Transdermal Q72H Angiulli, Lavon Paganini, PA-C   1.5 mg at 02/04/22 9528   traZODone (DESYREL) tablet 75 mg  75 mg Per Tube QHS Courtney Heys, MD   75 mg at 02/03/22 2207     Discharge Medications: Please see discharge summary for a list of discharge medications.  Relevant Imaging Results:  Relevant Lab Results:   Additional Information    Dyanne Iha

## 2022-02-04 NOTE — Progress Notes (Signed)
Speech Language Pathology Daily Session Note  Patient Details  Name: [name] MRN: [id number] Date of Birth: [birth year]  Today's Date: 02/04/2022 SLP Individual Time: 0900-0945 SLP Individual Time Calculation (min): 45 min  Short Term Goals: Week 6: SLP Short Term Goal 1 (Week 6): STGs=LTGs due to ELOS  Skilled Therapeutic Interventions: Skilled ST treatment focused on cognitive goals. SLP facilitated session by providing mod A for orientation to date and situation. Retention of this information improved as SLP and pt discussed his upcoming birthday. Pt had difficulty recalling his current age but with improved accuracy when given field of 3 choices. Nurse arrived for med pass via PEG. Pt demonstrates awareness of NPO status and remains hopeful to consume PO intake again in the future with improved swallow function. Pt recalled that he exhibited nausea at some point last week which may have attributed to doing physical activity immediately following tube feed/med pass. Pt identified importance of additional time to digest feedings prior to increasing physical movement. Patient was left in wheelchair with soft waist belt in place at RN station.Continue per current plan of care.     Pain No/Denies Pain  Therapy/Group: Individual Therapy  Patty Sermons 02/04/2022, 9:43 AM

## 2022-02-05 MED ORDER — LOSARTAN POTASSIUM 50 MG PO TABS
25.0000 mg | ORAL_TABLET | Freq: Every day | ORAL | Status: DC
  Administered 2022-02-06 – 2022-02-08 (×3): 25 mg
  Filled 2022-02-05 (×3): qty 1

## 2022-02-05 NOTE — Progress Notes (Signed)
Physical Therapy Session Note  Patient Details  Name: [name] MRN: [id number] Date of Birth: [birth year]  Today's Date: 02/05/2022 PT Individual Time: 0800-0915 PT Individual Time Calculation (min): 75 min   Short Term Goals: Week 5:  PT Short Term Goal 1 (Week 5): STG=LTG due to ELOS  Skilled Therapeutic Interventions/Progress Updates:     Patient in secured enclosure bed asleep upon PT arrival. Patient slow to arouse and agreeable to PT session. Patient denied pain during session.  Patient's bed dry, TED hose doffed, and foam dressings still applied to B heels at beginning of session. Patient denied tingling or pain in his feet this morning and reported he slept well overnight.  Patient with reports of intermittent dizziness with mobility with TED hose donned. Vitals: Sitting: BP 116/66, HR 80 Standing: BP 93/54, HR 83 (mild symptoms) Standing x3 min: BP 98/58, HR 88 (mild symptoms) After ambulation: BP 110/59, HR 91 (moderate symptoms)  Therapeutic Activity: Bed Mobility: Patient performed supine to sit with supervision with x2 attempts. Provided verbal cues for use of B elbows to push up to sitting. Patient sat EOB with supervision while he doffed his non-skid socks, donned TED hose, and pants with supervision focused on sitting balance with forward lean and use of R hand for task for improved motor control. He did require total A for placing TED hose over his feet and supervision and min cues for R hand use to pull up TEDs. Patient reported mild dizziness when returning to upright from bending forward x2. Transfers: Patient performed stand pivot bed>BSC and BSC to TIS w/c with CGA using RW. He performed sit to/from stand with CGA using RW x2. Performed standing balance with single upper extremity support with CGA progressing to supervision x3 min during vitals assessment, see above. Provided verbal cues for hand placement on RW and reaching back to sit with lack of carry-over on  reaching back x2.  Gait Training:  6 Min Walk Test:  Instructed patient to ambulate as quickly and as safely as possible for 6 minutes using LRAD. Patient was allowed to take standing rest breaks without stopping the test, but if the patient required a sitting rest break the clock would be stopped and the test would be over.  Results: 175 feet in 1:54, limited by dizziness (53.3 meters, Avg speed 0.5 m/s) using a RW with CGA. Results indicate that the patient has reduced endurance with ambulation compared to age matched norms.  Age Matched Norms: 12-69 yo M: 59 F: 59, 84-79 yo M: 54 F: 471, 48-89 yo M: 417 F: 392 MDC: 58.21 meters (190.98 feet) or 50 meters (ANPTA Core Set of Outcome Measures for Adults with Neurologic Conditions, 2018)  Patient in TIS w/c with soft waist belt restraint seated at the nurses station and handed off to the nurse secretary at for one-on-one supervision.   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

## 2022-02-05 NOTE — Progress Notes (Signed)
Occupational Therapy Session Note  Patient Details  Name: [name] MRN: [id number] Date of Birth: Apr 04, 1946  Today's Date: 02/05/2022 OT Individual Time: 1400-1510 OT Individual Time Calculation (min): 70 min    Short Term Goals: Week 6:  OT Short Term Goal 1 (Week 6): Continued progressing toward established LTG  Skilled Therapeutic Interventions/Progress Updates:   Pt received supine with his wife [name] present, no c/o pain. Pt agreeable to shower. Checked BP EOB and it was 99/58. Pt completed ambulatory transfer to the shower and BP was assessed again d/t pt appearing pale. It read 89/47. After several minutes it remained low at 90/50. Recommended against shower d/t BP. He completed transfer back to the w/c and completed Adls at the sink. UB bathing with (S). Min A for LB bathing and brief change in standing. He completed shave with an Copy with mod A, and then OT provided total A with a straight razor. Pt was then agreeable to go outside and get some fresh air with his wife. Discussed d/c planning outside. He returned inside and then was left sitting up with his wife, posey waist belt on.    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

## 2022-02-05 NOTE — Progress Notes (Signed)
PROGRESS NOTE  Hgb stable at 8 Son visiting patient today Patient would like posey door open tonight- will order for tonight to see how he does with hopes of removing it tomorrow.   ROS: Limited due to cognitive/behavioral   Objective:   No results found. Recent Labs    02/04/22 0516  WBC 5.2  HGB 8.0*  HCT 25.3*  PLT 281      Recent Labs    02/04/22 0516  NA 140  K 4.1  CL 104  CO2 28  GLUCOSE 96  BUN 17  CREATININE 0.82  CALCIUM 8.6*       No intake or output data in the 24 hours ending 02/05/22 1412              Physical Exam: Vital Signs Blood pressure 124/60, pulse 69, temperature 98.4 F (36.9 C), temperature source Oral, resp. rate 18, height 5\' 10"  (1.778 m), weight 88.3 kg, SpO2 96 %.  Gen: no distress, normal appearing HEENT: oral mucosa pink and moist, NCAT Cardio: Reg rate Chest: normal effort, normal rate of breathing Abd: soft, non-distended Ext: no edema Psych: pleasant, normal affect Skin: intact   Neuro:  .alert, oriented to person, [place], speech clearer. Was able to tell me about the book he was reading. Ataxic limbs. Moves all 4's. No resting tone. Sensory exam grossly intact.     Musculoskeletal: no jt pain   Assessment/Plan: 1. Functional deficits which require 3+ hours per day of interdisciplinary therapy in a comprehensive inpatient rehab setting. Physiatrist is providing close team supervision and 24 hour management of active medical problems listed below. Physiatrist and rehab team continue to assess barriers to discharge/monitor patient progress toward functional and medical goals  Care Tool:  Bathing    Body parts bathed by patient: Right arm, Left arm, Right lower leg, Left lower leg, Face, Chest, Abdomen, Front perineal area, Buttocks, Right upper leg, Left upper leg   Body parts bathed by helper: Right arm, Left arm, Chest, Abdomen      Bathing assist Assist Level: Minimal Assistance - Patient > 75%     Upper Body Dressing/Undressing Upper body dressing   What is the patient wearing?: Pull over shirt    Upper body assist Assist Level: Supervision/Verbal cueing    Lower Body Dressing/Undressing Lower body dressing      What is the patient wearing?: Pants, Incontinence brief     Lower body assist Assist for lower body dressing: Moderate Assistance - Patient 50 - 74%     Toileting Toileting    Toileting assist Assist for toileting: Moderate Assistance - Patient 50 - 74%     Transfers Chair/bed transfer  Transfers assist     Chair/bed transfer assist level: Contact Guard/Touching assist Chair/bed transfer assistive device: Programmer, multimedia   Ambulation assist   Ambulation activity did not occur: Safety/medical concerns  Assist level: Minimal Assistance - Patient > 75% Assistive device: Walker-rolling Max distance: 200 ft   Walk 10 feet activity   Assist  Walk 10 feet activity did not occur: Safety/medical concerns  Assist level: Minimal Assistance - Patient > 75% Assistive device: Walker-rolling  Walk 50 feet activity   Assist Walk 50 feet with 2 turns activity did not occur: Safety/medical concerns  Assist level: Minimal Assistance - Patient > 75% Assistive device: Walker-rolling    Walk 150 feet activity   Assist Walk 150 feet activity did not occur: Safety/medical concerns  Assist level: Minimal Assistance - Patient > 75% Assistive device: Walker-rolling    Walk 10 feet on uneven surface  activity   Assist Walk 10 feet on uneven surfaces activity did not occur: Safety/medical concerns         Wheelchair     Assist Is the patient using a wheelchair?: Yes Type of Wheelchair: Manual Wheelchair activity did not occur: Safety/medical concerns (limited by cognitive deficits)  Wheelchair assist level: Supervision/Verbal cueing Max wheelchair  distance: 57 ft    Wheelchair 50 feet with 2 turns activity    Assist    Wheelchair 50 feet with 2 turns activity did not occur: Safety/medical concerns   Assist Level: Supervision/Verbal cueing   Wheelchair 150 feet activity     Assist  Wheelchair 150 feet activity did not occur: Safety/medical concerns       Blood pressure 124/60, pulse 69, temperature 98.4 F (36.9 C), temperature source Oral, resp. rate 18, height 5\' 10"  (1.778 m), weight 88.3 kg, SpO2 96 %.    Medical Problem List and Plan: 1. 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  Status post posterior decompression with craniectomy 11/26/2021             -patient may not yet shower             -ELOS/Goals:  --  supervision to min assist goals   - still requiring net bed for safety , not agitated thsi am   -Continue CIR therapies including PT, OT, and SLP, extend until Friday, left message for wife to discuss her concerns.   -needs 2 person assist to bathroom for safety, enclosure bed   2.  Antithrombotics: -DVT/anticoagulation:  Pharmaceutical: Other (comment) Eliquis             -antiplatelet therapy: N/A 3. Pain Management: Tizanidine no longer ordered, Voltaren gel 4 times daily, Fioricet as needed, oxycodone as needed 4. Agitation: may have open enclosure bed tonight and will discontinue tomorrow if tolerated well               -antipsychotic agents: seroquel               - sleep chart    - currently seroquel 50 mg qam and 150mg  qhs  -continue HS celexa for sleep and mood stabilization  -rx'ed UTI 2/17 -- HS agitation/confusion -rx uti  -increased celexa to 20mg  qhs  -Continue seroquel dosing with same schedule   25mg -25mg -100mg  daily  - enclosure bed for safety, no waist belt except in w/c- team to decide when this can be d/ced 2/20: messaged team regarding whether continued posey bed is necessary 5. Neuropsych:  This patient is not capable of making decisions on his own behalf.  - telesitter ongoing-  6. Skin/Wound Care: Routine skin checks 7. Fluids/Electrolytes/Nutrition:   -IR replaced foley with new balloon bumper PEG yesterday. Appreciate help  - continue bolus feeds  -labs for 2/20 8.  Prolonged ventilatory support.  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     Decannulated 1/16- stoma closed 9.  Dysphagia.  Status post gastrostomy tube 12/13/2021 per Dr.Lovick-  -continue NPO, trials with SLP  only. 10.  Aspiration pneumonia.  Antibiotic therapy completed.  -afebrile  -oob, oxygenation improved 11.  New onset atrial fibrillation.  Started Eliquis 12/17/2021.  Continue low-dose beta-blocker.    HR controlled  -appreciate cardiology consult.   2/17 continued hematuria and clots   -low dose eliquis stopped 2/9   -off asa and plavix now   -will not attempt to resume anticoagulants/antiplatelets   -pt will see Dr. Quentin Ore in office to discuss [facility] as outpt    Vitals:   02/04/22 2033 02/05/22 0607  BP: (!) 118/55 124/60  Pulse: 68 69  Resp: 18 18  Temp: 98.7 F (37.1 C) 98.4 F (36.9 C)  SpO2: 92% 96%    12.  Hypertension.  d/c norvasc. Decrease Cozaar to 25mg  daily, , clonidine 0.1 mg every 8 hours. 13.  Hyperlipidemia.  Lipitor 14.  History of BPH/urinary retention.        -generally has been voiding but is incontinent 15.  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  Follow-up per cardiology services. 16. 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   -continue   keflex for 3 more days   -hgb down to 9.4---recheck 2/18, 8.6 asymptomatic , recheck 7.8, will transfuse if <7.0,     --urology has seen, radiation cystitis, outpt HBO, will need to f/u with [facility]rologist at [facility]  -called  wife to update her/answer her questions regarding the hematuria/hgb/blood checks at home.   -hgb stable at 8, repeat tomorrow.                   LOS: 42 days A FACE TO FACE EVALUATION WAS PERFORMED  Martha Clan P Annessa Satre 02/05/2022, 2:12 PM

## 2022-02-05 NOTE — Patient Care Conference (Signed)
Inpatient RehabilitationTeam Conference and Plan of Care Update Date: 02/05/2022   Time: 10:15 AM    Patient Name: [name]      Medical Record Number: [id number]  Date of Birth: 05/27/1946 Sex: Male         Room/Bed: 4W16C/4W16C-01 Payor Info: Payor: MEDICARE / Plan: MEDICARE PART A AND B / Product Type: *No Product type* /    Admit Date/Time:  12/25/2021  3:05 PM  Primary Diagnosis:  Cerebellar infarction [facility]  Hospital Problems: Principal Problem:   Cerebellar infarction [facility]) Active Problems:   PAF (paroxysmal atrial fibrillation) ([place])   Hematuria    Expected Discharge Date: Expected Discharge Date: 02/08/22  Team Members Present: Physician leading conference: Dr. Leeroy Cha Social Worker Present: [name], [place] Nurse Present: Dorthula Nettles, RN PT Present: Apolinar Junes, PT OT Present: Laverle Hobby, OT SLP Present: Weston Anna, SLP PPS Coordinator present : Gunnar Fusi, SLP     Current Status/Progress Goal Weekly Team Focus  Bowel/Bladder   Pt is continent/incontinent of bowel/bladder  Pt will become continent of bowel/bladder  Will assess qshift and PRN   Swallow/Nutrition/ Hydration   NPO with PEG, trials with SLP  Min A  Ongoing trials   ADL's   Improvement in cognition/agitation overall. Transfers CGA-min A d/t ataxia. LB Dressing min A, UB ADLS (S)  Supervision to CGA overall  D/c planning, family education, motor planning, sequencing, ataxia   Mobility   Min A-CGA overall, improved cognition and mobility, increased dizziness the last 2 days, BP with soft BP  CGA-min A overall, 2 steps 1 rail  balance, safety awareness, impulse control, gait and stair training, activity tolerance, d/c planning, patient/caregiver education   Communication   Supervision-Min A  Supervision  Family Education   Safety/Cognition/ Behavioral Observations  Mod A  mod A  Family Education   Pain   Pt is pain free  Pt will remain pain free  Will assess  qshift and PRN   Skin   Pt's skin is intact  Pt's skin will remain intact  Will assess qshift and PRN     Discharge Planning:  D/c to home with his wife and son who will provide 24/7 care. Wife works from home and has a flexible schedule in which she is able to still able to provide care to patient.  [employer] to provide food (Nutren 1.5) which has been received. Pt will need to d/c to home with 5 days worth of TF as [employer] can not ship out until date of discharge. [facility] for HHPT/OT/SLP/aide/SN (TF edu). DME: w/c, RW, and BSC ordered through [place].   Team Discussion: Hgb at 8 and stable. Hematuria continues, current UTI being tx. Will verify if approved for lab draws at home. Cognition improved, impulsivity improved. Increased dizziness, BP dropped. Mostly continent B/B, blood clots remain in urine. Scheduled pain medications effective. Nursing to remove Ted hose every evening. Heels now have foam dressings. Can follow demands better, no agitation or aggression.   Patient on target to meet rehab goals: yes, contact guard/min assist goals. Still Ataxic. Ambulated 170 ft with RW.  *See Care Plan and progress notes for long and short-term goals.   Revisions to Treatment Plan:  Monitoring labs   Teaching Needs: Family education, medication/pain management, skin/wound care, safety awareness, transfer/gait training, etc.   Current Barriers to Discharge: Wound care and Ataxic gait.  Possible Resolutions to Barriers: Family education Follow-up PT/OT     Medical Summary Current Status:  vitals stable, continues on bolus tube feeds, hematuria, impulsivity, multiple falls, improving cognition/agitation  Barriers to Discharge: Medical stability  Barriers to Discharge Comments: vitals stable, dysphagia, hematuria, impulsivity, multiple falls Possible Resolutions to [employer] Focus: continue to monitor vitals TID, continue bolus tube feeds, monitor hemoglobin, will  try to get weekly lab checks at home with nursing, wean off posey bed   Continued Need for Acute Rehabilitation Level of Care: The patient requires daily medical management by a physician with specialized training in physical medicine and rehabilitation for the following reasons: Direction of a multidisciplinary physical rehabilitation program to maximize functional independence : Yes Medical management of patient stability for increased activity during participation in an intensive rehabilitation regime.: Yes   I attest that I was present, lead the team conference, and concur with the assessment and plan of the team.   Cristi Loron 02/05/2022, 12:56 PM

## 2022-02-05 NOTE — Progress Notes (Signed)
Patient ID: [name], male   DOB: 1946/07/15, 76 y.o.   MRN: [id number]  SW spoke with pt wife to provide updates from team conference, and d/c date remains Friday (2/24). Wife and son here. SW will bring enteral feeds for them to take home.   *Enteral feeds provided to family. Reviewed discharge and new order for weekly labs to check hemoglobin and discussed discharge process.   [name], MSW, [place] Office: [phone] Cell: [phone] Fax: [phone]

## 2022-02-05 NOTE — Plan of Care (Signed)
Downgraded to reflect pt fluctuations   Problem: RH Toileting Goal: LTG Patient will perform toileting task (3/3 steps) with assistance level (OT) Description: LTG: Patient will perform toileting task (3/3 steps) with assistance level (OT)  Flowsheets (Taken 02/05/2022 1016) LTG: Pt will perform toileting task (3/3 steps) with assistance level: (downgraded 2/21- SD) Minimal Assistance - Patient > 75%   Problem: RH Toilet Transfers Goal: LTG Patient will perform toilet transfers w/assist (OT) Description: LTG: Patient will perform toilet transfers with assist, with/without cues using equipment (OT) Flowsheets (Taken 02/05/2022 1016) LTG: Pt will perform toilet transfers with assistance level of: (downgraded 2/21- SD) Minimal Assistance - Patient > 75%

## 2022-02-05 NOTE — Progress Notes (Signed)
Speech Language Pathology Daily Session Note  Patient Details  Name: [name] MRN: [id number] Date of Birth: 10-18-1946  Today's Date: 02/05/2022 SLP Individual Time: 1100-1130 SLP Individual Time Calculation (min): 30 min  Short Term Goals: Week 6: SLP Short Term Goal 1 (Week 6): STGs=LTGs due to ELOS  Skilled Therapeutic Interventions: Skilled treatment session focused on cognitive and dysphagia goals. Upon arrival, patient was awake while upright in the wheelchair. Patient performed oral care after set-up. Patient consumed trials of ice chips and thin liquids via tsp with only one coughing episode. Patient required Min verbal cues for use of swallowing compensatory strategies and demonstrated increased ability to verbalize how to "swallow safely" and what "bad swallow" feels like. Patient demonstrated increased recall of daily information and could independently verbalize 2 activities he completed in PT and also was independently oriented to date as he reported that [birth year] is his birthday. Patient demonstrated appropriate selective attention to all tasks for 25 minutes with supervision level verbal cues for redirection. Patient left upright in wheelchair with son present. Continue with current plan of care.      Pain Pain Assessment Pain Scale: 0-10 Pain Score: 0-No pain  Therapy/Group: Individual Therapy  Thereasa Iannello 02/05/2022, 11:54 AM

## 2022-02-05 NOTE — Progress Notes (Signed)
Physical Therapy TBI Note  Patient Details  Name: [name] MRN: [id number] Date of Birth: [birth year]  Today's Date: 02/05/2022 PT Individual Time: 1032-1101 PT Individual Time Calculation (min): 29 min   Short Term Goals: Week 4:  PT Short Term Goal 1 (Week 4): STG=LTG due to ELOS PT Short Term Goal 1 - Progress (Week 4): Progressing toward goal Week 5:  PT Short Term Goal 1 (Week 5): STG=LTG due to ELOS  Skilled Therapeutic Interventions/Progress Updates:  Patient seated up in TIS w/c on entrance to room. Patient alert and agreeable to PT session. Able to relate that his birthday is tomorrow.   Patient with no pain complaint throughout session.  Therapeutic Activity: Transfers: Patient performed sit<>stand and stand pivot transfers throughout session with mild impulsivity and MinA for balance. Provided vc for slowing movements for increased safety.  Gait Training: Pt ambulated around therapy gym using RW and up to [place] for ambulation and up to Min/ ModA for balance with reaching with RUE to collect weighted balls from around gym.   Neuromuscular Re-ed: NMR facilitated during session with focus on standing dynamic balance, coordination, body awareness. Pt guided in horseshoe toss to target 15 ft away. Uses RUE for first 8 tosses with uncoordinated throwing and significant over throwing d/t continued ataxia. Switched to LUE with improved ability to focus effort, direction, motor plan, and execute horseshoes landing closer to target despite using non dominant UE. Improvement in zeroing in on target throughout 3x8 tosses.   NMR performed for improvements in motor control and coordination, balance, sequencing, judgement, and self confidence/ efficacy in performing all aspects of mobility at highest level of independence.   Patient seated  in TIS w/c at end of session with brakes locked, secure belt fastened to w/c, son (grandson?) in room, and all needs within reach.     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 General:   Vital Signs:   Pain: Pain Assessment Pain Scale: 0-10 Pain Score: 0-No pain Agitated Behavior Scale: TBI Observation Details Observation Environment: CIR Start of observation period - Date: 02/05/22 Start of observation period - Time: 1030 End of observation period - Date: 02/05/22 End of observation period - Time: 1100 Agitated Behavior Scale (DO NOT LEAVE BLANKS) Short attention span, easy distractibility, inability to concentrate: Present to a slight degree Impulsive, impatient, low tolerance for pain or frustration: Absent Uncooperative, resistant to care, demanding: Absent Violent and/or threatening violence toward people or property: Absent Explosive and/or unpredictable anger: Absent Rocking, rubbing, moaning, or other self-stimulating behavior: Absent Pulling at tubes, restraints, etc.: Absent Wandering from treatment areas: Absent Restlessness, pacing, excessive movement: Absent Repetitive behaviors, motor, and/or verbal: Absent Rapid, loud, or excessive talking: Absent Sudden changes of mood: Absent Easily initiated or excessive crying and/or laughter: Absent Self-abusiveness, physical and/or verbal: Absent Agitated behavior scale total score: 15   Therapy/Group: Individual Therapy  Alger Simons PT, DPT 02/05/2022, 10:31 AM

## 2022-02-06 LAB — CBC
HCT: 23.9 % — ABNORMAL LOW (ref 39.0–52.0)
Hemoglobin: 7.7 g/dL — ABNORMAL LOW (ref 13.0–17.0)
MCH: 30.2 pg (ref 26.0–34.0)
MCHC: 32.2 g/dL (ref 30.0–36.0)
MCV: 93.7 fL (ref 80.0–100.0)
Platelets: 292 10*3/uL (ref 150–400)
RBC: 2.55 MIL/uL — ABNORMAL LOW (ref 4.22–5.81)
RDW: 15.2 % (ref 11.5–15.5)
WBC: 4.5 10*3/uL (ref 4.0–10.5)
nRBC: 0 % (ref 0.0–0.2)

## 2022-02-06 NOTE — Progress Notes (Signed)
Occupational Therapy Session Note  Patient Details  Name: [name] MRN: [id number] Date of Birth: [birth year]  Today's Date: 02/06/2022 OT Individual Time: 1300-1400 OT Individual Time Calculation (min): 60 min    Short Term Goals: Week 6:  OT Short Term Goal 1 (Week 6): Continued progressing toward established LTG  Skilled Therapeutic Interventions/Progress Updates:    Pt received supine in enclosure bed with no c/o pain. Pt transferred to EOB with (S). BP assessed EOB- 91/45. Also of note- HgB currently 7.7. Pt received tube feed prior to session (~15 min) so minimized gross motor movements during sessions to reduce risk of n/v. He transferred to the w/c with the RW with CGA. He was taken via w/c to the therapy gym. He completed functional reaching activity on the BITS to address BUE ataxia. 1.5 lb wrist weights on for improved proprioceptive input and to challenge UE endurance. 2 min trials with weights- 76.56 % accuracy and 49 hits. 2 min without wrist weights- 66.67% accuracy and 54 hits. Next worked on [place] task with a numerical sequence with a rotator to challenge visual scanning as well. Max cueing for sequencing numbers 1-22. Pt often becoming distracted and then perseverative on random numbers. He benefits from self provided verbal cueing- stating numbers aloud. He then completed memory task with a 3 item sequence, scoring 52% and requiring mod vc and again benefiting from verbal repetition. He returned to his room and was left sitting up in the w/c with all needs met, son and wife present.   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

## 2022-02-06 NOTE — Discharge Summary (Signed)
Physician Discharge Summary  Patient ID: [name] MRN: [id number] DOB/AGE: Jul 16, 1946 76 y.o.  Admit date: 12/25/2021 Discharge date: 02/07/2022  Discharge Diagnoses:  Principal Problem:   Cerebellar infarction [facility]) Active Problems:   PAF (paroxysmal atrial fibrillation) (HCC)   Hematuria DVT prophylaxis Hematuria related to prostate cancer/radiation/recurrent UTI Dysphagia/gastrostomy tube 12/13/2021 Prolonged ventilatory support status post tracheostomy 12/10/2021 Hypertension CAD with CABG Hyperlipidemia Mood stabilization  Discharged Condition: Stable  Significant Diagnostic Studies: CT HEAD WO CONTRAST (5MM)  Result Date: 01/31/2022 CLINICAL DATA:  Mental status change. History of bilateral cerebellar infarcts. EXAM: CT HEAD WITHOUT CONTRAST TECHNIQUE: Contiguous axial images were obtained from the base of the skull through the vertex without intravenous contrast. RADIATION DOSE REDUCTION: This exam was performed according to the departmental dose-optimization program which includes automated exposure control, adjustment of the mA and/or kV according to patient size and/or use of iterative reconstruction technique. COMPARISON:  01/29/2022 FINDINGS: Brain: No evidence of acute infarction, hemorrhage, hydrocephalus, extra-axial collection or mass lesion/mass effect. Encephalomalacia within bilateral cerebellar hemispheres is stable compared with the previous exam compatible with chronic infarct. Postoperative changes from suboccipital craniectomy identified. 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 Vascular: No hyperdense vessel or unexpected calcification. Skull: No acute osseous findings. Status post suboccipital craniectomy. Sinuses/Orbits: No acute findings. Other: None IMPRESSION: 1. No acute intracranial abnormalities. 2. Chronic bilateral cerebellar infarcts. 3. Status post suboccipital  craniectomy with stable right side postop CSF hygroma combined with meningocele and partial right cerebellar herniation. Electronically Signed   By: Kerby Moors M.D.   On: 01/31/2022 06:19   CT HEAD WO CONTRAST (5MM)  Result Date: 01/29/2022 CLINICAL DATA:  76 year old male with agitation. 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 RADIATION DOSE REDUCTION: This exam was performed according to the departmental dose-optimization program which includes automated exposure control, adjustment of the mA and/or kV according to patient size and/or use of iterative reconstruction technique. COMPARISON:  Head CT 01/26/2022 and earlier. FINDINGS: Brain: No supratentorial midline shift, ventriculomegaly, mass effect, evidence of mass lesion, intracranial hemorrhage or evidence of cortically based acute infarction. Supratentorial gray-white matter differentiation is stable and within normal limits for age. 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 Cerebellar mass effect not significantly changed since December. Vascular: Mild Calcified atherosclerosis at the skull base. No suspicious intracranial vascular hyperdensity. Skull: No acute osseous abnormality identified. Previous suboccipital craniectomy. Sinuses/Orbits: Visualized paranasal sinuses and mastoids are stable and well aerated. Other: Visualized orbit soft tissues are within normal limits. Scalp soft tissues appear stable, including partially calcified chronic suboccipital meningocele. IMPRESSION: 1. No acute intracranial  abnormality. 2. Continued stable post ischemic and postoperative changes to the cerebellum and posterior fossa with right > left cerebellar encephalomalacia combined with right side CSF hygroma and cephalocele. Electronically Signed   By: Genevie Ann M.D.   On: 01/29/2022 06:46   CT HEAD WO CONTRAST (5MM)  Result Date: 01/26/2022 CLINICAL DATA:  Head trauma with severe headache EXAM: CT HEAD WITHOUT CONTRAST TECHNIQUE: Contiguous axial images were obtained from the base of the skull through the vertex without intravenous contrast. RADIATION DOSE  REDUCTION: This exam was performed according to the departmental dose-optimization program which includes automated exposure control, adjustment of the mA and/or kV according to patient size and/or use of iterative reconstruction technique. COMPARISON:  Head CT from earlier the same day FINDINGS: Brain: Prior suboccipital craniectomy with right cerebellar encephalomalacia and lateral cerebellar CSF collection. Bulging of the meninges and gliotic right cerebellum through the craniectomy, unchanged. No hydrocephalus, acute infarct, mass, or blood products. Vascular: No hyperdense vessel or unexpected calcification. Skull: Suboccipital craniectomy as described Sinuses/Orbits: Negative IMPRESSION: 1. No acute finding or change from earlier today. 2. Suboccipital craniectomy with chronic hygroma, right cerebellar gliosis, and cephalocele. Electronically Signed   By: Jorje Guild M.D.   On: 01/26/2022 09:57   CT HEAD WO CONTRAST (5MM)  Result Date: 01/26/2022 CLINICAL DATA:  Stroke follow-up. EXAM: CT HEAD WITHOUT CONTRAST TECHNIQUE: Contiguous axial images were obtained from the base of the skull through the vertex without intravenous contrast. RADIATION DOSE REDUCTION: This exam was performed according to the departmental dose-optimization program which includes automated exposure control, adjustment of the mA and/or kV according to patient size and/or use of iterative  reconstruction technique. COMPARISON:  Head CT dated 01/02/2022. FINDINGS: Brain: Mild age-related atrophy and chronic microvascular ischemic changes. Right cerebellar infarct as seen on the prior CTs. There is no acute intracranial hemorrhage. No mass effect or midline shift. No extra-axial fluid collection. Vascular: No hyperdense vessel or unexpected calcification. Skull: No acute calvarial pathology. Suboccipital craniectomy with herniation of the cerebellum similar to prior CT. Sinuses/Orbits: The visualized paranasal sinuses are clear. Partial opacification of the mastoid air cells bilaterally. Other: None IMPRESSION: 1. No acute intracranial hemorrhage. 2. Mild age-related atrophy and chronic microvascular ischemic changes. Right cerebellar infarct, seen previously. 3. Suboccipital craniectomy with herniation of the right cerebellum similar to prior CT. Electronically Signed   By: Anner Crete M.D.   On: 01/26/2022 01:45   IR Replc Gastro/Colonic Tube Percut W/Fluoro  Result Date: 01/29/2022 INDICATION: Patient with history of dysphagia. Request is for gastrostomy tube replacement. EXAM: GASTROSTOMY TUBE EXCHANGE UNDER FLUOROSCOPY COMPARISON:  None MEDICATIONS: No; Antibiotics were administered within 1 hour of the procedure. CONTRAST:  7 mL of Isovue 300 administered into the gastric lumen. ANESTHESIA/SEDATION: Moderate (conscious) sedation was not employed during this procedure FLUOROSCOPY TIME:  0 minutes 6 seconds (1 mGy) COMPLICATIONS: None immediate. PROCEDURE: Informed written consent was obtained from the patient and/or patient's representative following explanation of the procedure, risks, benefits and alternatives. A time out was performed prior to the initiation of the procedure. Ultrasound scanning was performed to demarcate the edge of the left lobe of the liver. Maximal barrier sterile technique utilized including caps, mask, sterile gowns, sterile gloves, large sterile drape, hand  hygiene and Betadine prep. The right upper quadrant was sterilely prepped and draped. Under intermittent fluoroscopic guidance, the place holder catheter was exchanged for a Amplatz, ultimately allowing placement of a 22-French balloon retention gastrostomy tube. The retention balloon was insufflated with a mixture of dilute saline and pulled taut against the anterior wall of the stomach. The external disc was cinched. Contrast injection confirms positioning within the stomach. Spot radiographic images confirmed placement within the stomach. The patient tolerated procedure well without immediate post procedural complication. FINDINGS: After successful fluoroscopic guided placement, the gastrostomy tube is appropriately positioned with internal retention balloon against the ventral aspect of the gastric lumen. IMPRESSION: Successful fluoroscopic exchange for a 22 Fr balloon retention gastrostomy tube. The gastrostomy tube is available to use at  this time. Read by: Rushie Nyhan, NP PLAN: The patient should return to interventional radiology for routine gastrostomy catheter exchange in 6 months. Electronically Signed   By: Michaelle Birks M.D.   On: 01/29/2022 13:11   DG Swallowing Func-Speech Pathology  Result Date: 01/18/2022 Table formatting from the original result was not included. Objective Swallowing Evaluation: Type of Study: MBS-Modified Barium Swallow Study  Patient Details Name: Turki Tapanes MRN: [id number] Date of Birth: [birth year] Today's Date: 01/18/2022 Past Medical History: Past Medical History: Diagnosis Date  CAD (coronary artery disease)   a. s/p CABG in 2012  Hyperlipidemia  Past Surgical History: Past Surgical History: Procedure Laterality Date  CORONARY ARTERY BYPASS GRAFT  2012  ESOPHAGOGASTRODUODENOSCOPY N/A 12/13/2021  Procedure: ESOPHAGOGASTRODUODENOSCOPY (EGD);  Surgeon: Jesusita Oka, MD;  Location: [facility] ENDOSCOPY;  Service: General;  Laterality: N/A;  PEG PLACEMENT N/A 12/13/2021   Procedure: PERCUTANEOUS ENDOSCOPIC GASTROSTOMY (PEG) PLACEMENT;  Surgeon: Jesusita Oka, MD;  Location: [facility] ENDOSCOPY;  Service: General;  Laterality: N/A;  SUBOCCIPITAL CRANIECTOMY CERVICAL LAMINECTOMY N/A 11/26/2021  Procedure: SUBOCCIPITAL CRANIECTOMY;  Surgeon: Judith Part, MD;  Location: [place];  Service: Neurosurgery;  Laterality: N/A; HPI: See H&P  Subjective: alert  Recommendations for follow up therapy are one component of a multi-disciplinary discharge planning process, led by the attending physician.  Recommendations may be updated based on patient status, additional functional criteria and insurance authorization. Assessment / Plan / Recommendation Clinical Impressions 01/18/2022 Clinical Impression Patients overall swallowing function appears mildly improved since last MBS. Patient continues to demonstrate a moderate pharyngeal dysphagia characterized by reduced tongue base retraction and anterior laryngeal mobility resulting in decreased pharyngeal constriction and UES opening with moderate vallecular and mild pyriform sinus residue with thicker textures. Residue was reduced with thin liquids. Patient would intermittently utilize spontaneous swallows which would help reduce the residue, but it would intermittently spill over into his airway with intermittent sensation.  Patients overall cognitive deficits make it difficult for patient to utilize any compensatory strategy consistently. However, patient responded best to visual feedback from the [facility] monitor with cues to swallow hard and squeeze those muscles. Recommend patient remain NPO but continue trials of thin liquids via cup and puree textures (separately). Discussed with physician and the patients wife regarding aspiration risk as trials progress. Both verbalized understanding and agreement. SLP Visit Diagnosis Dysphagia, pharyngeal phase (R13.13) Attention and concentration deficit following -- Frontal lobe and executive function  deficit following -- Impact on safety and function Moderate aspiration risk   Treatment Recommendations 01/18/2022 Treatment Recommendations Therapy as outlined in treatment plan below   Prognosis 01/18/2022 Prognosis for Safe Diet Advancement Fair Barriers to Reach Goals Cognitive deficits;Severity of deficits Barriers/Prognosis Comment -- Diet Recommendations 01/18/2022 SLP Diet Recommendations NPO Liquid Administration via -- Medication Administration Via alternative means Compensations -- Postural Changes --   Other Recommendations 01/18/2022 Recommended Consults -- Oral Care Recommendations Oral care QID Other Recommendations -- Follow Up Recommendations -- Assistance recommended at discharge -- Functional Status Assessment -- Frequency and Duration  01/02/2022 Speech Therapy Frequency (ACUTE ONLY) min 3x week Treatment Duration 3 weeks   Oral Phase 01/18/2022 Oral Phase WFL Oral - Pudding Teaspoon -- Oral - Pudding Cup -- Oral - Honey Teaspoon -- Oral - Honey Cup -- Oral - Nectar Teaspoon -- Oral - Nectar Cup -- Oral - Nectar Straw -- Oral - Thin Teaspoon -- Oral - Thin Cup -- Oral - Thin Straw -- Oral - Puree -- Oral - Mech Soft -- Oral -  Regular -- Oral - Multi-Consistency -- Oral - Pill -- Oral Phase - Comment --  Pharyngeal Phase 01/18/2022 Pharyngeal Phase Impaired Pharyngeal- Pudding Teaspoon -- Pharyngeal -- Pharyngeal- Pudding Cup -- Pharyngeal -- Pharyngeal- Honey Teaspoon -- Pharyngeal -- Pharyngeal- Honey Cup -- Pharyngeal -- Pharyngeal- Nectar Teaspoon NT Pharyngeal -- Pharyngeal- Nectar Cup Delayed swallow initiation-vallecula;Delayed swallow initiation-pyriform sinuses;Reduced pharyngeal peristalsis;Reduced anterior laryngeal mobility;Reduced tongue base retraction;Penetration/Aspiration during swallow;Penetration/Apiration after swallow Pharyngeal Material enters airway, remains ABOVE vocal cords and not ejected out Pharyngeal- Nectar Straw Delayed swallow initiation-vallecula;Delayed swallow  initiation-pyriform sinuses;Reduced pharyngeal peristalsis;Reduced anterior laryngeal mobility;Reduced tongue base retraction;Penetration/Aspiration during swallow;Penetration/Apiration after swallow Pharyngeal Material enters airway, passes BELOW cords without attempt by patient to eject out (silent aspiration) Pharyngeal- Thin Teaspoon Reduced pharyngeal peristalsis;Reduced anterior laryngeal mobility;Reduced tongue base retraction;Delayed swallow initiation-vallecula;Delayed swallow initiation-pyriform sinuses Pharyngeal Material does not enter airway Pharyngeal- Thin Cup Delayed swallow initiation-vallecula;Delayed swallow initiation-pyriform sinuses;Reduced pharyngeal peristalsis;Reduced anterior laryngeal mobility;Reduced tongue base retraction;Penetration/Aspiration during swallow Pharyngeal Material enters airway, CONTACTS cords and then ejected out;Material does not enter airway;Material enters airway, passes BELOW cords without attempt by patient to eject out (silent aspiration) Pharyngeal- Thin Straw Delayed swallow initiation-vallecula;Delayed swallow initiation-pyriform sinuses;Reduced pharyngeal peristalsis;Reduced anterior laryngeal mobility;Penetration/Aspiration during swallow;Reduced tongue base retraction;Penetration/Apiration after swallow Pharyngeal Material enters airway, passes BELOW cords and not ejected out despite cough attempt by patient;Material enters airway, passes BELOW cords then ejected out Pharyngeal- Puree Delayed swallow initiation-pyriform sinuses;Delayed swallow initiation-vallecula;Reduced pharyngeal peristalsis;Reduced anterior laryngeal mobility;Reduced tongue base retraction Pharyngeal Material does not enter airway Pharyngeal- Mechanical Soft -- Pharyngeal -- Pharyngeal- Regular -- Pharyngeal -- Pharyngeal- Multi-consistency -- Pharyngeal -- Pharyngeal- Pill -- Pharyngeal -- Pharyngeal Comment --  Cervical Esophageal Phase  01/02/2022 Cervical Esophageal Phase WFL Pudding  Teaspoon -- Pudding Cup -- Honey Teaspoon -- Honey Cup -- Nectar Teaspoon -- Nectar Cup -- Nectar Straw -- Thin Teaspoon -- Thin Cup -- Thin Straw -- Puree -- Mechanical Soft -- Regular -- Multi-consistency -- Pill -- Cervical Esophageal Comment -- PAYNE, COURTNEY 01/18/2022, 4:06 PM        Weston Anna, MA, CCC-SLP               Labs:  Basic Metabolic Panel: Recent Labs  Lab 02/01/22 0617 02/04/22 0516  NA 140 140  K 4.2 4.1  CL 104 104  CO2 26 28  GLUCOSE 97 96  BUN 21 17  CREATININE 0.83 0.82  CALCIUM 8.8* 8.6*    CBC: Recent Labs  Lab 02/02/22 0641 02/04/22 0516 02/06/22 0547  WBC 5.3 5.2 4.5  HGB 8.6* 8.0* 7.7*  HCT 27.1* 25.3* 23.9*  MCV 94.4 94.8 93.7  PLT 270 281 292    CBG: No results for input(s): GLUCAP in the last 168 hours.  Family history.  Brother with cancer and CAD.  Denies any diabetes mellitus or rectal cancer  Brief HPI:   Dushawn Pusey is a 76 y.o. right-handed male with history of CAD status post CABG x4 in 2012 in [place] maintained on low-dose aspirin hyperlipidemia nephrolithiasis BPH/prostate cancer with radiation therapy.  Per chart review lives with spouse independent prior to admission.  Presented 11/25/2021 with altered mental status as well as dizziness with ataxic gait and diaphoretic.  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  Negative for hemorrhage.  He did receiveTNKase.  CT angiogram head and neck negative for intracranial large vessel occlusion.  No significant basilar stenosis or thrombus.  Follow-up MRI showed interval progression of edema associated to bilateral cerebellar acute infarct  with mass effect on the fourth ventricle and low-lying right cerebellar tonsil.  No hydrocephalus.  EKG showed sinus rhythm.  EKG atrial flutter with rapid ventricular rate echocardiogram with ejection fraction of 70 to 75% no wall motion abnormalities grade 1 diastolic dysfunction.  Cardiology services consulted for  atrial flutter RVR placed on intravenous Cardizem.  Neurosurgery consulted regards to cerebellar stroke increased intracranial pressure underwent suboccipital decompressive craniectomy 11/26/2021 per Dr. Venetia Constable.  Patient did remain intubated for prolonged time underwent tracheostomy 12/10/2021 as well as PEG tube placement 12/13/2021 per Dr.Lovick.  Hospital course complicated by aspiration pneumonia right lower lobe completing antibiotic course.  Tracheostomy tube downsized 12/24/2021.  He was later cleared to begin Eliquis for new onset atrial fibrillation 12/17/2021.  Bouts of urinary retention BPH maintained on Urecholine.  Hyperactive delirium placed on low-dose Zyprexa.  Therapy evaluations completed due to patient decreased functional mobility was admitted for a comprehensive rehab program.   Hospital Course: Keno Caraway was admitted to rehab 12/25/2021 for inpatient therapies to consist of PT, ST and OT at least three hours five days a week. Past admission physiatrist, therapy team and rehab RN have worked together to provide customized collaborative inpatient rehab.  Pertaining to patient's bilateral cerebellar hemorrhagic infarct cerebellar edema and compression of ventral pons.  Status post posterior decompression craniectomy 12/06/2021 with surgical site healing nicely.  Patient would follow-up Dr. Venetia Constable.  Latest cranial CT scan 01/31/2022 showed no acute intracranial abnormalities chronic bilateral cerebellar infarcts.  Mood stabilization with initial telemetry sitter as well as enclosure bed for safety and currently maintained on Celexa as well as Seroquel with trazodone to help aid in sleep.  It was discussed with wife on numerous occasions the need to maintain patient's safety.  Noted dysphagia n.p.o. gastrostomy tube feeds as advised.  Hospital course was complicated by aspiration pneumonia completing course of antibiotics oxygen saturations maintained.  Prolonged ventilatory support  tracheostomy 12/10/2021 was decannulated 12/1618 23 stoma closed.  New onset atrial fibrillation Eliquis initiated 12/17/2021 as well as low-dose beta-blocker followed by cardiology services.  Patient with persistent hematuria followed by urology services related to patient's history of prostate cancer radiation close monitoring of hemoglobin due to ongoing bouts of hematuria and discussion with both cardiology services and urology his Eliquis was changed to aspirin and Plavix again with persistent bleeding with aspirin and Plavix later discontinued there was discussion outpatient for evaluation of Watchman.  His blood pressure remained somewhat soft Norvasc discontinued Cozaar adjusted remained on clonidine.  Patient did have a history of CAD with CABG in [place] no chest pain or shortness of breath.  Hospital course also complicated by E. coli UTI completing course of Keflex.   Blood pressures were monitored on TID basis and soft and monitored     Rehab course: During patient's stay in rehab weekly team conferences were held to monitor patient's progress, set goals and discuss barriers to discharge. At admission, patient required max assist stand pivot transfers moderate assist step pivot transfers  Physical exam.  Blood pressure 115/62 pulse 55 temperature 99.1 respirations 21 oxygen saturation 98% room air Constitutional.  Sitting up in chair HEENT Head.  Craniotomy site clean and dry Eyes.  Pupils round and reactive to light no discharge without nystagmus Neck.  Supple nontender no JVD without thyromegaly Cardiac regular rate rhythm any extra sounds or murmur heard Abdomen.  Soft nontender positive bowel sounds gastrostomy tube in place Respiratory scattered rhonchi upper airway Neurologic.  Oriented to self makes  eye contact with examiner.  Speaks but typically nonsensible with some dysarthria.  Moves all 4 limbs.  Seems to sense pain in all fours.  He/She  has had  improvement in activity tolerance, balance, postural control as well as ability to compensate for deficits. He/She has had improvement in functional use RUE/LUE  and RLE/LLE as well as improvement in awareness.  Patient perform supine to sit supervision.  Patient sat edge of bed supervision while he doffed his nonskid socks as well as TED hose and pants with supervision.  Perform stand pivot bed to bedside commode contact-guard.  Perform standing balance with single upper extremity support contact-guard.  Ambulates rolling walker contact-guard eating cues for safety.  Patient completed amatory transfers to the shower contact-guard.  Upper body bathing with supervision minimal assist for lower body bathing.  Speech therapy follow-up for cognitive and dysphagia goals patient demonstrated increased recall of daily information and could independently verbalize 2 activities he completed with therapies.  Full family teaching completed plan discharged to home       Disposition: Discharge to home    Diet: NPO.  Jevity tube feeds 356 mL 4 times daily by tube/free water 250 mL every 4 hours by tube  Special Instructions: No smoking or alcohol  Follow-up with Dr. Feliz Beam [phone] for persistent hematuria related to prostate cancer  Follow-up Dr. Lars Mage cardiology services to arrange a follow-up appointment to discuss Watchman procedure   Medications at discharge. 1.  Tylenol as needed 2.  Lipitor 80 mg daily by tube 3.  Fioricet 1 tablet every 8 hours as needed by tube 4.  Celexa 20 mg nightly by tube 5.  Voltaren gel 2 g 4 times daily to affected area 6.  Ferrous sulfate 220 mg daily by tube 7.  Cozaar 25 mg daily by tube 8.  Oxycodone 2.5 mg every 6 hours as needed pain 9.  Prilosec 20 mg daily by tube 10.  Seroquel 100 mg daily at 2100 and 25 mg daily at 0700 as well as 25 mg daily at 1600 11.  Trazodone 75 mg nightly by tube  30-35 minutes were spent completing discharge  summary and discharge planning  Discharge Instructions     Ambulatory referral to Neurology   Complete by: As directed    An appointment is requested in approximately: 4 weeks bilateral cerebellar stroke   Ambulatory referral to Physical Medicine Rehab   Complete by: As directed    Moderate complexity follow-up 1 to 2 weeks bilateral cerebellar infarct        Follow-up Information     Meredith Staggers, MD Follow up.   Specialty: Physical Medicine and Rehabilitation Why: Office to call for appointment Contact information: [address] [place] [address] [phone]         Judith Part, MD Follow up.   Specialty: Neurosurgery Why: Call for appointment Contact information: [address] [place] [address] [phone]         Jesusita Oka, MD Follow up.   Specialty: Surgery Why: Call for appointment Contact information: [place] [address] [phone]         Jacky Kindle, MD Follow up.   Specialty: Internal Medicine Why: Call for appointment Contact information: [place] [place] [address] [phone]         Vickie Epley, MD Follow up.   Specialties: Cardiology, Radiology Why: Dr. Mardene Speak office will  call you to arrange a follow-up appointment to discuss Watchman procedure Contact information: [place] [address] [phone]                 Signed: Cathlyn Parsons 02/07/2022, 5:23 AM

## 2022-02-06 NOTE — Progress Notes (Signed)
PROGRESS NOTE  No new complaints this morning Ambulated 100 feet with Cherie Hgb 7.7 today   ROS: Limited due to cognitive/behavioral   Objective:   No results found. Recent Labs    02/04/22 0516 02/06/22 0547  WBC 5.2 4.5  HGB 8.0* 7.7*  HCT 25.3* 23.9*  PLT 281 292      Recent Labs    02/04/22 0516  NA 140  K 4.1  CL 104  CO2 28  GLUCOSE 96  BUN 17  CREATININE 0.82  CALCIUM 8.6*        Intake/Output Summary (Last 24 hours) at 02/06/2022 1327 Last data filed at 02/06/2022 0120 Gross per 24 hour  Intake --  Output 500 ml  Net -500 ml                Physical Exam: Vital Signs Blood pressure (!) 91/45, pulse 81, temperature 97.9 F (36.6 C), temperature source Oral, resp. rate 18, height 5\' 10"  (1.778 m), weight 88.3 kg, SpO2 100 %.  Gen: no distress, normal appearing HEENT: oral mucosa pink and moist, NCAT Cardio: Reg rate Chest: normal effort, normal rate of breathing Abd: soft, non-distended Ext: no edema Psych: pleasant, normal affect Skin: intact   Neuro:  .alert, oriented to person, [place], speech clearer. Was able to tell me about the book he was reading. Ataxic limbs. Moves all 4's. No resting tone. Sensory exam grossly intact.     Musculoskeletal: no jt pain   Assessment/Plan: 1. Functional deficits which require 3+ hours per day of interdisciplinary therapy in a comprehensive inpatient rehab setting. Physiatrist is providing close team supervision and 24 hour management of active medical problems listed below. Physiatrist and rehab team continue to assess barriers to discharge/monitor patient progress toward functional and medical goals  Care Tool:  Bathing    Body parts bathed by patient: Right arm, Left arm, Right lower leg, Left lower leg, Face, Chest, Abdomen, Front perineal area, Buttocks, Right upper leg, Left upper leg   Body parts bathed by helper:  Right arm, Left arm, Chest, Abdomen     Bathing assist Assist Level: Minimal Assistance - Patient > 75%     Upper Body Dressing/Undressing Upper body dressing   What is the patient wearing?: Pull over shirt    Upper body assist Assist Level: Supervision/Verbal cueing    Lower Body Dressing/Undressing Lower body dressing      What is the patient wearing?: Pants, Incontinence brief     Lower body assist Assist for lower body dressing: Moderate Assistance - Patient 50 - 74%     Toileting Toileting    Toileting assist Assist for toileting: Moderate Assistance - Patient 50 - 74%     Transfers Chair/bed transfer  Transfers assist     Chair/bed transfer assist level: Contact Guard/Touching assist Chair/bed transfer assistive device: Programmer, multimedia   Ambulation assist   Ambulation activity did not occur: Safety/medical concerns  Assist level: Minimal Assistance - Patient > 75% Assistive device: Walker-rolling Max distance: 200 ft   Walk 10 feet activity   Assist  Walk 10 feet activity did not occur: Safety/medical concerns  Assist level: Minimal  Assistance - Patient > 75% Assistive device: Walker-rolling   Walk 50 feet activity   Assist Walk 50 feet with 2 turns activity did not occur: Safety/medical concerns  Assist level: Minimal Assistance - Patient > 75% Assistive device: Walker-rolling    Walk 150 feet activity   Assist Walk 150 feet activity did not occur: Safety/medical concerns  Assist level: Minimal Assistance - Patient > 75% Assistive device: Walker-rolling    Walk 10 feet on uneven surface  activity   Assist Walk 10 feet on uneven surfaces activity did not occur: Safety/medical concerns         Wheelchair     Assist Is the patient using a wheelchair?: Yes Type of Wheelchair: Manual Wheelchair activity did not occur: Safety/medical concerns (limited by cognitive deficits)  Wheelchair assist level:  Supervision/Verbal cueing Max wheelchair distance: 57 ft    Wheelchair 50 feet with 2 turns activity    Assist    Wheelchair 50 feet with 2 turns activity did not occur: Safety/medical concerns   Assist Level: Supervision/Verbal cueing   Wheelchair 150 feet activity     Assist  Wheelchair 150 feet activity did not occur: Safety/medical concerns       Blood pressure (!) 91/45, pulse 81, temperature 97.9 F (36.6 C), temperature source Oral, resp. rate 18, height 5\' 10"  (1.778 m), weight 88.3 kg, SpO2 100 %.    Medical Problem List and Plan: 1. 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  Status post posterior decompression with craniectomy 11/26/2021             -patient may not yet shower             -ELOS/Goals:  --  supervision to min assist goals   - still requiring net bed for safety , not agitated thsi am   -Continue CIR therapies including PT, OT, and SLP, extend until Friday, left message for wife to discuss her concerns.   -needs 2 person assist to bathroom for safety, enclosure bed   2.  Antithrombotics: -DVT/anticoagulation:  Pharmaceutical: Other (comment) Eliquis             -antiplatelet therapy: N/A 3. Pain Management: Tizanidine no longer ordered, Voltaren gel 4 times daily, Fioricet as needed, oxycodone as needed 4. Agitation: may have open enclosure bed tonight and will discontinue tomorrow if tolerated well               -antipsychotic agents: seroquel               - sleep chart    - currently seroquel 50 mg qam and 150mg  qhs  -continue HS celexa for sleep and mood stabilization  -rx'ed UTI 2/17 -- HS agitation/confusion -rx uti  -increased celexa to 20mg  qhs  -Continue seroquel dosing with same schedule   25mg -25mg -100mg  daily  - enclosure bed for safety, no waist belt except in w/c- team to decide when this can be d/ced 2/20: messaged team regarding whether  continued posey bed is necessary 5. Neuropsych: This patient is not capable of making decisions on his own behalf.  - telesitter ongoing-  6. Skin/Wound Care: Routine skin checks 7. Fluids/Electrolytes/Nutrition:   -IR replaced foley with new balloon bumper PEG yesterday. Appreciate help  - continue bolus feeds 8.  Prolonged ventilatory support.  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     Decannulated 1/16- stoma closed 9.  Dysphagia.  Status post gastrostomy tube 12/13/2021 per  Dr.Lovick-  -continue NPO, trials with SLP only. 10.  Aspiration pneumonia.  Antibiotic therapy completed.  -afebrile  -oob, oxygenation improved 11.  New onset atrial fibrillation.  Started Eliquis 12/17/2021.  Continue low-dose beta-blocker.    HR controlled  -appreciate cardiology consult.   2/17 continued hematuria and clots   -low dose eliquis stopped 2/9   -off asa and plavix now   -will not attempt to resume anticoagulants/antiplatelets   -pt will see Dr. Quentin Ore in office to discuss Watchman as outpt    Vitals:   02/06/22 0436 02/06/22 1314  BP: (!) 143/66 (!) 91/45  Pulse: (!) 58 81  Resp: 15 18  Temp: 98.6 F (37 C) 97.9 F (36.6 C)  SpO2: 100% 100%    12.  Hypertension.  d/c norvasc. Decrease Cozaar to 25mg  daily, , clonidine 0.1 mg every 8 hours.  2/22: BP labile, continue to monitor.  13.  Hyperlipidemia.  Lipitor 14.  History of BPH/urinary retention.        -generally has been voiding but is incontinent 15.  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  Follow-up per cardiology services. 16. 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   -continue   keflex for 3 more days   -hgb down to 9.4---recheck 2/18, 8.6 asymptomatic , recheck 7.8, will transfuse if <7.0,     --urology has seen,  radiation cystitis, outpt HBO, will need to f/[facility]rolog[facility]  -called wife to update her/answer her questions regarding the hematuria/hgb/blood checks at home.   -hgb stable at 87.7, repeat tomorrow.                   LOS: 43 days A FACE TO FACE EVALUATION WAS PERFORMED  Martha Clan P Lya Holben 02/06/2022, 1:27 PM

## 2022-02-06 NOTE — Progress Notes (Signed)
Speech Language Pathology Daily Session Note  Patient Details  Name: [name] MRN: [id number] Date of Birth: [birth year]  Today's Date: 02/06/2022 SLP Individual Time: 1400-1500 SLP Individual Time Calculation (min): 60 min  Short Term Goals: Week 6: SLP Short Term Goal 1 (Week 6): STGs=LTGs due to ELOS  Skilled Therapeutic Interventions: Skilled treatment session focused on cognitive and dysphagia goals. SLP facilitated session by administering the Martell Status Examination (SLUMS). Patient scored  11/30 points with a score of 27 or above considered normal. Patient demonstrated deficits in orientation, problem solving, recall and attention. SLP also administered trials of ice chips and thin liquids via tsp. Patient with one coughing episode and required Mod verbal cues for use of swallowing compensatory strategies. Patient's wife and son present and educated regarding current cognitive deficits and strategies to utilize at home to maximize attention, orientation, problem solving and overall safety. Both verbalized understanding. Patient left upright in wheelchair with family present. Continue with current plan of care.       Pain No/Denies Pain   Therapy/Group: Individual Therapy  Reathel Turi 02/06/2022, 3:34 PM

## 2022-02-06 NOTE — Progress Notes (Signed)
Physical Therapy Session Note  Patient Details  Name: [name] MRN: [id number] Date of Birth: [birth year]  Today's Date: 02/06/2022 PT Individual Time: 1000-1032; 32 mins  Short Term Goals:  Week 5:  PT Short Term Goal 1 (Week 5): STG=LTG due to ELOS  Skilled Therapeutic Interventions/Progress Updates:    Patient received sitting up in wc at nurses station, agreeable to PT. He denies pain. PT transporting patient in wc to therapy gym for time management and energy conservation. BP in sitting: 112/87. Unable to successfully get a blood pressure in standing as patient would continue to move his arm and/or grip the RW. He denies dizziness/feeling lightheaded. He ambulated 2x73ft with RW and CGA. He remains with truncal ataxia and variable step length with general instability. Patient requesting to complete time on the NuStep- x2 mins, x1 min with rest breaks. BP after activity: 133/78. Patient returning to room in wc- ambulatory transfer to bed. Posey bed secured, call light within reach, RN aware of patients location.   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    Therapy/Group: Individual Therapy  Karoline Caldwell, PT, DPT, CBIS  02/06/2022, 7:51 AM

## 2022-02-06 NOTE — Progress Notes (Signed)
Physical Therapy Session Note  Patient Details  Name: [name] MRN: [id number] Date of Birth: 12-Oct-1946  Today's Date: 02/06/2022 PT Individual Time: 0800-0900 PT Individual Time Calculation (min): 60 min   Short Term Goals: Week 5:  PT Short Term Goal 1 (Week 5): STG=LTG due to ELOS  Skilled Therapeutic Interventions/Progress Updates:     Patient in secured enclosure bed upon PT arrival. Patient alert and agreeable to PT session. Patient denied pain during session.  Patient's bed dry, TED hose doffed, and foam dressings still applied to B heels at beginning of session. Patient denied tingling or pain in his feet this morning and reported he slept well overnight.  Patient required x2 cues to recall that today is his bithday, required max cuing to recall his correct age.   Therapeutic Activity: Bed Mobility: Patient performed supine to sit with supervision with x2 attempts. Provided verbal cues for use of B elbows to push up to sitting. Patient sat EOB with supervision while he doffed his non-skid socks, donned TED hose, and pants with supervision focused on sitting balance with forward lean and use of R hand for task for improved motor control. He did require total A for placing TED hose over his feet and supervision and min cues for R hand use to pull up TEDs.  Transfers: Patient performed stand pivot bed>BSC and BSC to TIS w/c with CGA using RW. He was continent of bladder during toileting with continued hematuria. Performed peri-care and lower body clothing management with supervision. He performed sit to/from stand with CGA using RW x2. Performed standing balance with single upper extremity support with CGA progressing to supervision x2 min. Provided verbal cues for hand placement on RW and reaching back to sit with lack of carry-over on reaching back x2.  Gait Training:  6 Min Walk Test:  Patient ambulated ~100 feet with 4x90 deg turns using RW with CGA. Focused on maintaining RW  close and on the ground during turns with good carry-over on second lap.   Patient in [place] w/c with seat belt alarm set, breaks locked and seated at the nurses station with intermittent supervision from staff and this PT until next therapy session, RN in agreement with this.  Patient sat >20 at the nurses station with intermittent supervision and remained seated reading his book calmly throughout.    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   Therapy/Group: Individual Therapy  Joseh Sjogren L Nader Boys PT, DPT  02/06/2022, 12:23 PM

## 2022-02-06 NOTE — Progress Notes (Signed)
Nutrition Follow-up  DOCUMENTATION CODES:   Not applicable  INTERVENTION:  Continue bolus feeds via PEG using Jevity 1.5 cal formula at volume of 356 mfL (1.5 cartons/ARCS, 6 total cartons per day) given QID    Continue 45 mL Prosource TF TID per tube    Continue free water flushes of 250 mL q 4 hours per tube    Tube feeding regimen provides 2256 kcal, 124 grams protein, and 2582 ml free water.    Upon discharge home, may substitute formula with Nutren 1.5 cal if unable to obtain Jevity 1.5 cal.  NUTRITION DIAGNOSIS:   Inadequate oral intake related to inability to eat as evidenced by NPO status; ongoing  GOAL:   Patient will meet greater than or equal to 90% of their needs; met with TF  MONITOR:   Labs, Weight trends, TF tolerance, Skin, I & O's  REASON FOR ASSESSMENT:   Consult Enteral/tube feeding initiation and management  ASSESSMENT:   76 year old male with history of CAD status post CABG x4 2012, hyperlipidemia, nephrolithiasis, BPH/prostate cancer. Presented 11/25/2021 with altered mental status as well as dizziness with ataxic gait and diaphoretic. 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 Pt underwent suboccipital decompressive craniectomy 11/26/2021. Pt intubated for prolonged time underwent tracheostomy 12/10/2021 and PEG tube placement 12/13/2021. Pt with decreased functional mobility and admitted to CIR.  Pt in enclosed bed. Pt continues with NPO status. Pt has been tolerating his tube feeds well. RD to continue with current feeding orders. Tube feeds continue to provide 100% of calorie and protein needs. Labs and medications reviewed.   Diet Order:   Diet Order             Diet NPO time specified  Diet effective midnight                   EDUCATION NEEDS:   Not appropriate for education at this time  Skin:  Skin Assessment: Reviewed RN Assessment Skin Integrity Issues:: Incisions Incisions: head  Last BM:  2/21  Height:    Ht Readings from Last 1 Encounters:  12/25/21 _0  (1.778 m)    Weight:   Wt Readings from Last 1 Encounters:  01/31/22 88.3 kg   BMI:  Body mass index is 27.93 kg/m.  Estimated Nutritional Needs:   Kcal:  2200-2400 kcal/d  Protein:  115-125 grams  Fluid:  >/= 2 L/day  Corrin Parker, MS, RD, LDN RD pager number/after hours weekend pager number on Amion.

## 2022-02-07 ENCOUNTER — Other Ambulatory Visit (HOSPITAL_COMMUNITY): Payer: Self-pay

## 2022-02-07 LAB — CBC
HCT: 25.6 % — ABNORMAL LOW (ref 39.0–52.0)
Hemoglobin: 8.1 g/dL — ABNORMAL LOW (ref 13.0–17.0)
MCH: 29.2 pg (ref 26.0–34.0)
MCHC: 31.6 g/dL (ref 30.0–36.0)
MCV: 92.4 fL (ref 80.0–100.0)
Platelets: 294 10*3/uL (ref 150–400)
RBC: 2.77 MIL/uL — ABNORMAL LOW (ref 4.22–5.81)
RDW: 15 % (ref 11.5–15.5)
WBC: 4.8 10*3/uL (ref 4.0–10.5)
nRBC: 0 % (ref 0.0–0.2)

## 2022-02-07 MED ORDER — SCOPOLAMINE 1 MG/3DAYS TD PT72
1.0000 | MEDICATED_PATCH | TRANSDERMAL | 0 refills | Status: DC
  Filled 2022-02-07 (×2): qty 8, 24d supply, fill #0

## 2022-02-07 MED ORDER — DICLOFENAC SODIUM 1 % EX GEL
2.0000 g | Freq: Four times a day (QID) | CUTANEOUS | 0 refills | Status: DC
  Filled 2022-02-07: qty 100, 12d supply, fill #0

## 2022-02-07 MED ORDER — FERROUS SULFATE 220 (44 FE) MG/5ML PO ELIX
220.0000 mg | ORAL_SOLUTION | Freq: Every day | ORAL | 3 refills | Status: DC
  Filled 2022-02-07: qty 150, 30d supply, fill #0

## 2022-02-07 MED ORDER — FREE WATER
250.0000 mL | Status: DC
Start: 2022-02-07 — End: 2022-09-11

## 2022-02-07 MED ORDER — CITALOPRAM HYDROBROMIDE 20 MG PO TABS
20.0000 mg | ORAL_TABLET | Freq: Every day | ORAL | 0 refills | Status: DC
  Filled 2022-02-07: qty 30, 30d supply, fill #0

## 2022-02-07 MED ORDER — JEVITY 1.5 CAL/FIBER PO LIQD: 356.0000 mL | Freq: Four times a day (QID) | ORAL | Status: DC

## 2022-02-07 MED ORDER — BUTALBITAL-APAP-CAFFEINE 50-325-40 MG PO TABS
1.0000 | ORAL_TABLET | Freq: Three times a day (TID) | ORAL | 0 refills | Status: DC | PRN
  Filled 2022-02-07: qty 14, 5d supply, fill #0

## 2022-02-07 MED ORDER — TRAZODONE HCL 150 MG PO TABS
75.0000 mg | ORAL_TABLET | Freq: Every day | ORAL | 0 refills | Status: DC
  Filled 2022-02-07: qty 15, 30d supply, fill #0

## 2022-02-07 MED ORDER — ATORVASTATIN CALCIUM 80 MG PO TABS
80.0000 mg | ORAL_TABLET | Freq: Every day | ORAL | 0 refills | Status: AC
Start: 2022-02-07 — End: ?
  Filled 2022-02-07: qty 30, 30d supply, fill #0

## 2022-02-07 MED ORDER — LOSARTAN POTASSIUM 25 MG PO TABS
25.0000 mg | ORAL_TABLET | Freq: Every day | ORAL | 0 refills | Status: AC
Start: 2022-02-07 — End: ?
  Filled 2022-02-07: qty 30, 30d supply, fill #0

## 2022-02-07 MED ORDER — ACETAMINOPHEN 160 MG/5ML PO SOLN
650.0000 mg | ORAL | 0 refills | Status: DC | PRN
Start: 2022-02-07 — End: 2022-09-11

## 2022-02-07 MED ORDER — QUETIAPINE FUMARATE 25 MG PO TABS
ORAL_TABLET | ORAL | 0 refills | Status: DC
  Filled 2022-02-07: qty 60, 30d supply, fill #0

## 2022-02-07 MED ORDER — OXYCODONE HCL 5 MG PO TABS
2.5000 mg | ORAL_TABLET | Freq: Four times a day (QID) | ORAL | 0 refills | Status: DC | PRN
  Filled 2022-02-07: qty 14, 7d supply, fill #0

## 2022-02-07 MED ORDER — PANTOPRAZOLE SODIUM 40 MG PO PACK
40.0000 mg | PACK | Freq: Every day | ORAL | 0 refills | Status: DC
  Filled 2022-02-07 (×2): qty 30, 30d supply, fill #0

## 2022-02-07 MED ORDER — QUETIAPINE FUMARATE 100 MG PO TABS
ORAL_TABLET | ORAL | 1 refills | Status: DC
  Filled 2022-02-07: qty 30, 30d supply, fill #0

## 2022-02-07 MED ORDER — OMEPRAZOLE 20 MG PO CPDR
DELAYED_RELEASE_CAPSULE | ORAL | 0 refills | Status: AC
  Filled 2022-02-07: qty 30, 30d supply, fill #0

## 2022-02-07 NOTE — TOC Benefit Eligibility Note (Signed)
Patient Advocate Encounter   Received notification that prior authorization for Scopolamine 1MG /3DAYS 72 hr patches is required.   PA submitted on 02/07/2022 Key [id number] Status is pending       Lyndel Safe, [place] Patient Advocate Specialist [place] Patient Advocate Team Direct Number: [phone]  Fax: [phone]

## 2022-02-07 NOTE — TOC Benefit Eligibility Note (Signed)
Patient Advocate Encounter  Prior Authorization for QUEtiapine Fumarate 25MG  tablets has been approved.     Effective dates: 02/07/2022 through 02/07/2023      Lyndel Safe, [place] Patient Advocate Specialist [place] Patient Advocate Team Direct Number: [phone]  Fax: [phone]

## 2022-02-07 NOTE — Progress Notes (Signed)
Physical Therapy Discharge Summary  Patient Details  Name: [name] MRN: [id number] Date of Birth: [birth year]  Patient has met 7 of 11 long term goals due to improved activity tolerance, improved balance, improved postural control, increased strength, ability to compensate for deficits, improved attention, improved awareness, and improved coordination.  Patient to discharge at an ambulatory level for household mobility with CGA-min A using a RW and wheelchair level for community mobility with supervision. Patient's care partner is independent to provide the necessary physical and cognitive assistance at discharge.  Reasons goals not met: Patient continues to require supervision for dynamic sitting balance due to intermittent retropulsion and truncal ataxia. Continues to require CGA-min A with bed>chair and car transfers and all gait due to ataxia and symptoms from anemia. Family demonstrates they are able to provide this level of assist at d/c. Educated on maintain short household walking distances at d/c due to symptoms of dizziness, fatigue, and SOB with anemia.   Recommendation:  Patient will benefit from ongoing skilled PT services in home health setting to continue to advance safe functional mobility, address ongoing impairments in balance, coordination, activity tolerance, gait and stair training, functional mobility, impulse control, safety awareness, patient/caregiver education, and minimize fall risk.  Equipment: RW and 18"x18" manual wheelchair  Reasons for discharge: treatment goals met and discharge from hospital  Patient/family agrees with progress made and goals achieved: Yes  PT Discharge Precautions/Restrictions Precautions Precautions: Fall Precaution Comments: R UE/LE and truncal ataxia Restrictions Weight Bearing Restrictions: No Other Position/Activity Restrictions: Wears a lift in R shoe s/p hip replacement on L causing leg length discrepency Pain  Interference Pain Interference Pain Effect on Sleep: 8. Unable to answer Pain Interference with Therapy Activities: 8. Unable to answer Pain Interference with Day-to-Day Activities: 8. Unable to answer Vision/Perception  Vision - History Ability to See in Adequate Light: 0 Adequate Vision - Assessment Eye Alignment: Within Functional Limits Ocular Range of Motion: Within Functional Limits Alignment/Gaze Preference: Within Defined Limits Tracking/Visual Pursuits: Decreased smoothness of eye movement to RIGHT superior field;Decreased smoothness of eye movement to RIGHT inferior field Saccades: Decreased speed of saccadic movement (to the R) Convergence: Within functional limits Diplopia Assessment:  (none present at time of assessment) Perception Perception: Impaired Inattention/Neglect: Does not attend to right side of body (mild imparement) Praxis Praxis: Impaired Praxis Impairment Details: Motor planning;Perseveration  Cognition Overall Cognitive Status: Impaired/Different from baseline Arousal/Alertness: Awake/alert Orientation Level: Oriented to place;Oriented to person;Oriented to situation Year: 2022 Month: February Day of Week: Incorrect Attention: Sustained Focused Attention: Appears intact Sustained Attention: Impaired Sustained Attention Impairment: Functional basic;Verbal basic Selective Attention: Impaired Selective Attention Impairment: Verbal basic;Functional basic Memory: Impaired Memory Impairment: Storage deficit;Decreased recall of new information;Decreased short term memory Decreased Short Term Memory: Verbal basic;Functional basic Awareness: Impaired Awareness Impairment: Emergent impairment Problem Solving: Impaired Problem Solving Impairment: Verbal basic;Functional basic Reasoning: Impaired Reasoning Impairment: Verbal basic;Functional basic Self Monitoring: Impaired Self Monitoring Impairment: Verbal basic;Functional basic Behaviors:  Impulsive;Perseveration Safety/Judgment: Impaired Sensation Sensation Light Touch: Appears Intact Hot/Cold: Appears Intact Proprioception: Impaired Detail Proprioception Impaired Details: Impaired RUE;Impaired RLE Coordination Gross Motor Movements are Fluid and Coordinated: No Fine Motor Movements are Fluid and Coordinated: No Coordination and Movement Description: Still with truncal and limb ataxia Motor  Motor Motor: Ataxia;Abnormal postural alignment and control Motor - Discharge Observations: decreased postural control and R side ataxia with impulsivity increasing patient's fall risk  Mobility Bed Mobility Bed Mobility: Supine to Sit;Sit to Supine Rolling Right: Supervision/verbal cueing Rolling Left: Supervision/Verbal cueing  Supine to Sit: Supervision/Verbal cueing Sit to Supine: Supervision/Verbal cueing Transfers Transfers: Sit to Stand;Stand Pivot Transfers;Stand to Sit Sit to Stand: Contact Guard/Touching assist Stand to Sit: Contact Guard/Touching assist Stand Pivot Transfers: Minimal Assistance - Patient > 75% Stand Pivot Transfer Details: Verbal cues for technique;Verbal cues for safe use of DME/AE Transfer (Assistive device): Rolling walker Locomotion  Gait Ambulation: Yes Gait Assistance: Contact Guard/Touching assist;Minimal Assistance - Patient > 75% Assistive device: Rolling walker Gait Assistance Details: Tactile cues for posture;Verbal cues for precautions/safety;Verbal cues for safe use of DME/AE Gait Gait: Yes Gait Pattern: Impaired Gait Pattern: Ataxic;Decreased stride length;Decreased hip/knee flexion - right;Lateral hip instability;Decreased trunk rotation;Narrow base of support;Trunk flexed Gait velocity: decreased Stairs / Additional Locomotion Stairs: Yes Stairs Assistance: Contact Guard/Touching assist;Minimal Assistance - Patient > 75% Stair Management Technique: One rail Left Height of Stairs: 6 Ramp: Minimal Assistance - Patient >75% (in  w/c) Product manager Mobility: Yes Wheelchair Assistance: Chartered loss adjuster: Both lower extermities Wheelchair Parts Management: Supervision/cueing  Trunk/Postural Assessment  Cervical Assessment Cervical Assessment: Within Functional Limits Thoracic Assessment Thoracic Assessment: Within Functional Limits Lumbar Assessment Lumbar Assessment: Exceptions to [facility] (posterior pelvic tilt) Postural Control Postural Control: Deficits on evaluation Trunk Control: decreased/delayed Righting Reactions: decreased/delayed  Balance Standardized Balance Assessment Standardized Balance Assessment: Berg Balance Test Berg Balance Test Sit to Stand: Able to stand  independently using hands Standing Unsupported: Able to stand 2 minutes with supervision Sitting with Back Unsupported but Feet Supported on Floor or Stool: Able to sit 2 minutes under supervision Stand to Sit: Controls descent by using hands Transfers: Needs one person to assist Standing Unsupported with Eyes Closed: Able to stand 10 seconds with supervision Standing Ubsupported with Feet Together: Needs help to attain position but able to stand for 30 seconds with feet together From Standing, Reach Forward with Outstretched Arm: Loses balance while trying/requires external support From Standing Position, Pick up Object from Floor: Unable to try/needs assist to keep balance From Standing Position, Turn to Look Behind Over each Shoulder: Needs assist to keep from losing balance and falling Turn 360 Degrees: Needs assistance while turning Standing Unsupported, Alternately Place Feet on Step/Stool: Needs assistance to keep from falling or unable to try Standing Unsupported, One Foot in Front: Loses balance while stepping or standing Standing on One Leg: Unable to try or needs assist to prevent fall Total Score: 17 Static Sitting Balance Static Sitting - Balance Support: Feet supported Static  Sitting - Level of Assistance: 5: Stand by assistance Dynamic Sitting Balance Dynamic Sitting - Balance Support: Left upper extremity supported;Right upper extremity supported;Feet supported;During functional activity Dynamic Sitting - Level of Assistance: 5: Stand by assistance Static Standing Balance Static Standing - Balance Support: During functional activity;No upper extremity supported Static Standing - Level of Assistance: 5: Stand by assistance Dynamic Standing Balance Dynamic Standing - Balance Support: During functional activity;Bilateral upper extremity supported Dynamic Standing - Level of Assistance: 4: Min assist Extremity Assessment  RLE Assessment Active Range of Motion (AROM) Comments: tight hamstrings General Strength Comments: Grossly 5/5 throughout in sitting RLE Tone RLE Tone: Within Functional Limits LLE Assessment LLE Assessment: Exceptions to [facility] Active Range of Motion (AROM) Comments: tight hamstrings General Strength Comments: Grossly 5/5 throughout LLE Tone LLE Tone: Within Functional Limits    Aurore Redinger L Jordyn Doane PT, DPT, CBIS 02/07/2022, 5:19 PM

## 2022-02-07 NOTE — Progress Notes (Signed)
Inpatient Rehabilitation Discharge Medication Review by a Pharmacist  A complete drug regimen review was completed for this patient to identify any potential clinically significant medication issues.  High Risk Drug Classes Is patient taking? Indication by Medication  Antipsychotic Yes Seroquel- mood stabilization  Anticoagulant No   Antibiotic No   Opioid Yes OxyIR- acute pain  Antiplatelet No   Hypoglycemics/insulin No   Vasoactive Medication Yes Losartan- hypertension  Chemotherapy No   Other Yes Lipitor- HLD Celexa- MDD Protonix- GERD     Type of Medication Issue Identified Description of Issue Recommendation(s)  Drug Interaction(s) (clinically significant)     Duplicate Therapy     Allergy     No Medication Administration End Date     Incorrect Dose     Additional Drug Therapy Needed     Significant med changes from prior encounter (inform family/care partners about these prior to discharge).    Other  Apixaban- New onset AF Hematuria and clots. Medication will continue to be held at the time or discharge and patient will follow-up with Dr. Quentin Ore to discuss Watchman as outpatient    Clinically significant medication issues were identified that warrant physician communication and completion of prescribed/recommended actions by midnight of the next day:  No  Name of provider notified for urgent issues identified:   Provider Method of Notification:     Pharmacist comments:   Time spent performing this drug regimen review (minutes):  30   [name] BS, PharmD, BCPS Clinical Pharmacist 02/07/2022 11:24 AM

## 2022-02-07 NOTE — TOC Benefit Eligibility Note (Signed)
Patient Advocate Encounter   Received notification that prior authorization for Protonix 40MG  packets is required.   PA submitted on 02/07/2022 Key [id number] Status is pending          Lyndel Safe, [place] Patient Advocate Specialist [place] Patient Advocate Team Direct Number: [phone]  Fax: [phone]

## 2022-02-07 NOTE — Progress Notes (Addendum)
Inpatient Rehabilitation Care Coordinator Discharge Note   Patient Details  Name: [name] MRN: [id number] Date of Birth: 1946/11/01   Discharge location: D/c to home with support from his wife and son  Length of Stay: 43 days  Discharge activity level: Contact Guard  Home/community participation: Limited  Patient response [id number] Literacy - How often do you need to have someone help you when you read instructions, pamphlets, or other written material from your doctor or pharmacy?: Never  Patient response [id number] Isolation - How often do you feel lonely or isolated from those around you?: Patient unable to respond  Services provided included: MD, RD, PT, OT, SLP, RN, CM, TR, Pharmacy, Neuropsych, SW  Financial Services:  Charity fundraiser Utilized: Medicare  Choices offered to/list presented to: Yes  Follow-up services arranged:  Home Health, DME, Other (Comment) (Enteral Feeds- [employer] (Nutren 1.5 will be substituted)) Home Health Agency: [facility] for HHPT/OT/SLP/aide/SN (TF edu and weekly hemoglobin labs)    DME : [employer] [facility] for w/c, RW, and 3in1 BSC  Patient response to transportation need: Is the patient able to respond to transportation needs?: Yes In the past 12 months, has lack of transportation kept you from medical appointments or from getting medications?: No In the past 12 months, has lack of transportation kept you from meetings, work, or from getting things needed for daily living?: No  Comments (or additional information):  Patient/Family verbalized understanding of follow-up arrangements:  Yes  Individual responsible for coordination of the follow-up plan: contact pt wife [name]  Confirmed correct DME delivered: [name] 02/07/2022    [name]

## 2022-02-07 NOTE — Progress Notes (Signed)
PROGRESS NOTE  No new complaints this morning He is ready for d/c tomorrow Hgb improved Posey bed d/ced   ROS: Limited due to cognitive/behavioral   Objective:   No results found. Recent Labs    02/06/22 0547 02/07/22 0649  WBC 4.5 4.8  HGB 7.7* 8.1*  HCT 23.9* 25.6*  PLT 292 294      No results for input(s): NA, K, CL, CO2, GLUCOSE, BUN, CREATININE, CALCIUM in the last 72 hours.       Intake/Output Summary (Last 24 hours) at 02/07/2022 1731 Last data filed at 02/06/2022 2106 Gross per 24 hour  Intake 28500 ml  Output --  Net 28500 ml                Physical Exam: Vital Signs Blood pressure (!) 101/48, pulse 72, temperature 97.8 F (36.6 C), temperature source Oral, resp. rate 18, height 5\' 10"  (1.778 m), weight 88.3 kg, SpO2 100 %.  Gen: no distress, normal appearing Gen: no distress, normal appearing HEENT: oral mucosa pink and moist, NCAT Cardio: Reg rate Chest: normal effort, normal rate of breathing Abd: soft, non-distended Ext: no edema Psych: pleasant, normal affect  Neuro:  .alert, oriented to person, [place], speech clearer. Was able to tell me about the book he was reading. Ataxic limbs. Moves all 4's. No resting tone. Sensory exam grossly intact.     Musculoskeletal: no jt pain   Assessment/Plan: 1. Functional deficits which require 3+ hours per day of interdisciplinary therapy in a comprehensive inpatient rehab setting. Physiatrist is providing close team supervision and 24 hour management of active medical problems listed below. Physiatrist and rehab team continue to assess barriers to discharge/monitor patient progress toward functional and medical goals  Care Tool:  Bathing    Body parts bathed by patient: Right arm, Left arm, Right lower leg, Left lower leg, Face, Chest, Abdomen, Front perineal area, Buttocks, Right upper leg, Left upper leg   Body parts bathed by  helper: Right arm, Left arm, Chest, Abdomen     Bathing assist Assist Level: Supervision/Verbal cueing (seated)     Upper Body Dressing/Undressing Upper body dressing   What is the patient wearing?: Pull over shirt    Upper body assist Assist Level: Supervision/Verbal cueing    Lower Body Dressing/Undressing Lower body dressing      What is the patient wearing?: Pants, Incontinence brief     Lower body assist Assist for lower body dressing: Contact Guard/Touching assist     Toileting Toileting    Toileting assist Assist for toileting: Minimal Assistance - Patient > 75%     Transfers Chair/bed transfer  Transfers assist     Chair/bed transfer assist level: Contact Guard/Touching assist Chair/bed transfer assistive device: Programmer, multimedia   Ambulation assist   Ambulation activity did not occur: Safety/medical concerns  Assist level: Minimal Assistance - Patient > 75% Assistive device: Walker-rolling Max distance: 200 ft   Walk 10 feet activity   Assist  Walk 10 feet activity did not occur: Safety/medical concerns  Assist level: Minimal Assistance - Patient > 75% Assistive device: Walker-rolling   Walk 50 feet activity   Assist Walk  50 feet with 2 turns activity did not occur: Safety/medical concerns  Assist level: Minimal Assistance - Patient > 75% Assistive device: Walker-rolling    Walk 150 feet activity   Assist Walk 150 feet activity did not occur: Safety/medical concerns  Assist level: Minimal Assistance - Patient > 75% Assistive device: Walker-rolling    Walk 10 feet on uneven surface  activity   Assist Walk 10 feet on uneven surfaces activity did not occur: Safety/medical concerns         Wheelchair     Assist Is the patient using a wheelchair?: Yes Type of Wheelchair: Manual Wheelchair activity did not occur: Safety/medical concerns (limited by cognitive deficits)  Wheelchair assist level:  Supervision/Verbal cueing Max wheelchair distance: 57 ft    Wheelchair 50 feet with 2 turns activity    Assist    Wheelchair 50 feet with 2 turns activity did not occur: Safety/medical concerns   Assist Level: Supervision/Verbal cueing   Wheelchair 150 feet activity     Assist  Wheelchair 150 feet activity did not occur: Safety/medical concerns       Blood pressure (!) 101/48, pulse 72, temperature 97.8 F (36.6 C), temperature source Oral, resp. rate 18, height 5\' 10"  (1.778 m), weight 88.3 kg, SpO2 100 %.    Medical Problem List and Plan: 1. 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  Status post posterior decompression with craniectomy 11/26/2021             -patient may not yet shower             -ELOS/Goals:  --  supervision to min assist goals   - still requiring net bed for safety , not agitated thsi am   -Continue CIR therapies including PT, OT, and SLP, extend until Friday, left message for wife to discuss her concerns.   -needs 2 person assist to bathroom for safety, enclosure bed   2.  Antithrombotics: -DVT/anticoagulation:  Pharmaceutical: Other (comment) Eliquis             -antiplatelet therapy: N/A 3. Pain Management: continue Tizanidine no longer ordered, Voltaren gel 4 times daily, Fioricet as needed, oxycodone as needed 4. Agitation: may have open enclosure bed tonight and will discontinue tomorrow if tolerated well               -antipsychotic agents: seroquel               - sleep chart    - currently seroquel 50 mg qam and 150mg  qhs  -continue HS celexa for sleep and mood stabilization  -rx'ed UTI 2/17 -- HS agitation/confusion -rx uti  -increased celexa to 20mg  qhs  -Continue seroquel dosing with same schedule   25mg -25mg -100mg  daily  - enclosure bed for safety, no waist belt except in w/c- team to decide when this can be d/ced 2/20: messaged team regarding  whether continued posey bed is necessary 5. Neuropsych: This patient is not capable of making decisions on his own behalf.  - telesitter ongoing-  6. Skin/Wound Care: Routine skin checks 7. Fluids/Electrolytes/Nutrition:   -IR replaced foley with new balloon bumper PEG yesterday. Appreciate help  - continue bolus feeds 8.  Prolonged ventilatory support.  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     Decannulated 1/16- stoma closed 9.  Dysphagia.  Status post gastrostomy tube 12/13/2021 per Dr.Lovick-  -continue NPO, trials with SLP only. 10.  Aspiration pneumonia.  Antibiotic therapy completed.  -  afebrile  -oob, oxygenation improved 11.  New onset atrial fibrillation.  Started Eliquis 12/17/2021.  Continue low-dose beta-blocker.    HR controlled  -appreciate cardiology consult.   2/17 continued hematuria and clots   -low dose eliquis stopped 2/9   -off asa and plavix now   -will not attempt to resume anticoagulants/antiplatelets   -pt will see Dr. Quentin Ore in office to discuss Watchman as outpt    Vitals:   02/07/22 0612 02/07/22 1320  BP: 129/70 (!) 101/48  Pulse: 65 72  Resp: 18 18  Temp: 98.5 F (36.9 C) 97.8 F (36.6 C)  SpO2: 98% 100%    12.  Hypertension.  d/c norvasc. Decrease Cozaar to 25mg  daily, , clonidine 0.1 mg every 8 hours.  2/22: BP labile, continue to monitor.  13.  Hyperlipidemia.  Lipitor 14.  History of BPH/urinary retention.        -generally has been voiding but is incontinent 15.  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  Follow-up per cardiology services. 16. 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   -continue   keflex for 3 more days   -hgb down to 9.4---recheck 2/18, 8.6 asymptomatic , recheck 7.8, will transfuse if <7.0,     --urology has seen,  radiation cystitis, outpt HBO, will need to f/u with urologist at [facility]  -called wife to update her/answer her questions regarding the hematuria/hgb/blood checks at home.   -hgb stable at 87.7, repeat tomorrow.                   LOS: 44 days A FACE TO FACE EVALUATION WAS PERFORMED  Izora Ribas 02/07/2022, 5:31 PM

## 2022-02-07 NOTE — Progress Notes (Addendum)
Occupational Therapy Discharge Summary  Patient Details  Name: [name] MRN: [id number] Date of Birth: 04-Aug-1946  Today's Date: 02/07/2022 OT Individual Time: 0930-1030 OT Individual Time Calculation (min): 60 min    Patient has met 9 of 10 long term goals due to improved activity tolerance, improved balance, postural control, ability to compensate for deficits, functional use of  RIGHT upper and LEFT upper extremity, improved attention, improved awareness, and improved coordination.  Patient to discharge at overall CGA/Min Assist level.  Patient's care partner is independent to provide the necessary physical and cognitive assistance at discharge. Family education has been completed over several sessions with his wife [name] and son [name]. They are both comfortable with providing CGA-min A level care and cognitive deficits. Extensive planning and recommendations have been made for fall risk reduction and to reduce burden of care. [name] has made great progress in his time in CIR. 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   Reasons goals not met: Pt did not meet his anticipatory awareness goal, he requires cueing for insight into his deficits.   Recommendation:  Patient will benefit from ongoing skilled OT services in home health setting to continue to advance functional skills in the area of BADL.  Equipment: BSC  Reasons for discharge: treatment goals met and discharge from hospital  Patient/family agrees with progress made and goals achieved: Yes  Skilled OT Intervention: Pt received in w/c with his son present, requesting to use the bathroom. Cueing required for impulsivity but pt very redirectable. He completed an ambulatory transfer into the bathroom with CGA- min A using the RW. He voided a BM and was able to complete peri hygiene in standing with CGA. He transferred into the shower with CGA. Skilled monitoring of vitals throughout session- BP stable and HgB up to  8.1. He completed bathing seated with (S) overall, sitting on a BSC and reaching through the middle seat to wash peri areas. He transferred back to the TIS for dressing. Teds removed and re-donned with total A, as well as heel foam bandages. He donned a shirt with (S) and pants with CGA. He completed oral care at the sink with great maintenance of NPO status. Edu re d/c planning provided throughout to pt and son Rob. Pt left sitting up with all needs met, chair alarm set.   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  ADL ADL Eating: NPO Where Assessed-Eating: Chair Grooming: Supervision/safety Where Assessed-Grooming: Sitting at sink Upper Body Bathing: Supervision/safety Where Assessed-Upper Body Bathing: Shower Lower Body Bathing: Supervision/safety Where Assessed-Lower Body Bathing: Shower Upper Body Dressing: Supervision/safety Where Assessed-Upper Body Dressing: Wheelchair Lower Body Dressing: Contact guard Where Assessed-Lower Body Dressing: Wheelchair Toileting: Minimal assistance Where Assessed-Toileting: Glass blower/designer: Therapist, music Method: Counselling psychologist: Energy manager: Unable to English as a second language teacher: Environmental education officer Method: Heritage manager: Civil engineer, contracting with back Vision Baseline Vision/History: 1 Wears glasses Patient Visual Report: No change from baseline Vision Assessment?: Yes;Vision impaired- to be further tested in functional context Eye Alignment: Within Functional Limits Ocular Range of Motion: Within Functional Limits Alignment/Gaze Preference: Within Defined Limits Tracking/Visual Pursuits: Able to track  stimulus in all quads without difficulty Saccades: Within functional limits Convergence: Within functional limits Perception  Perception: Impaired Figure Ground:  impaired Praxis Praxis: Impaired Praxis Impairment Details: Motor planning;Perseveration Cognition Orientation Level: Oriented to place;Oriented to time;Oriented to person BIMS: unable to remember any words Sensation Sensation Light Touch: Appears Intact Hot/Cold: Appears Intact Coordination Gross Motor Movements are Fluid and Coordinated: No Fine Motor Movements are Fluid and Coordinated: No Coordination and Movement Description: Still with truncal and limb ataxia Motor  Motor Motor: Ataxia Motor - Discharge Observations: Generalized weakness/deconditioning, ataxia Mobility  Bed Mobility Bed Mobility: Supine to Sit;Sit to Supine Rolling Right: Supervision/verbal cueing Rolling Left: Supervision/Verbal cueing Supine to Sit: Supervision/Verbal cueing Sit to Supine: Supervision/Verbal cueing Transfers Sit to Stand: Contact Guard/Touching assist Stand to Sit: Contact Guard/Touching assist  Trunk/Postural Assessment  Cervical Assessment Cervical Assessment: Within Functional Limits Thoracic Assessment Thoracic Assessment: Within Functional Limits Lumbar Assessment Lumbar Assessment: Exceptions to [facility] (posterior pelvic tilt) Postural Control Postural Control: Deficits on evaluation Trunk Control: decreased/delayed Righting Reactions: decreased/delayed  Balance Balance Balance Assessed: Yes Static Sitting Balance Static Sitting - Balance Support: Right upper extremity supported;Left upper extremity supported Static Sitting - Level of Assistance: 5: Stand by assistance Dynamic Sitting Balance Dynamic Sitting - Balance Support: Right upper extremity supported;Left upper extremity supported Dynamic Sitting - Level of Assistance: 5: Stand by assistance Static Standing Balance Static Standing - Balance Support:  During functional activity;Bilateral upper extremity supported Static Standing - Level of Assistance: 5: Stand by assistance Dynamic Standing Balance Dynamic Standing - Balance Support: During functional activity;Bilateral upper extremity supported Dynamic Standing - Level of Assistance: 4: Min assist Extremity/Trunk Assessment RUE Assessment RUE Assessment: Exceptions to [facility] Active Range of Motion (AROM) Comments: Strength WFL, limited by ataxia LUE Assessment LUE Assessment: Exceptions to [facility] General Strength Comments: Strength WFL, limited by ataxia   [name] 02/07/2022, 12:17 PM

## 2022-02-07 NOTE — TOC Benefit Eligibility Note (Signed)
Patient Advocate Encounter  Prior Authorization for Protonix 40MG  packets  has been approved.     Effective dates: 02/07/2022 through 02/07/2023      Lyndel Safe, [place] Patient Advocate Specialist [place] Patient Advocate Team Direct Number: [phone]  Fax: [phone]

## 2022-02-07 NOTE — TOC Benefit Eligibility Note (Signed)
Patient Advocate Encounter   Received notification that prior authorization for QUEtiapine Fumarate 25 MG tablets is required.   PA submitted on 02/07/2022 Key [id number] Status is pending          Lyndel Safe, [place] Patient Advocate Specialist [place] Patient Advocate Team Direct Number: [phone]  Fax: [phone]

## 2022-02-07 NOTE — TOC Benefit Eligibility Note (Signed)
Patient Advocate Encounter   Received notification that prior authorization for QUEtiapine Fumarate 100MG  tablets is required.   PA submitted on 02/07/2022 Key [id number] Status is pending          Lyndel Safe, [place] Patient Advocate Specialist [place] Patient Advocate Team Direct Number: [phone]  Fax: [phone]

## 2022-02-07 NOTE — TOC Benefit Eligibility Note (Signed)
Patient Advocate Encounter  Prior Authorization for Scopolamine 1MG /3DAYS 72 hr patches has been approved.     Effective dates: 02/07/2022 through 02/07/2023      Lyndel Safe, [place] Patient Advocate Specialist [place] Patient Advocate Team Direct Number: [phone]  Fax: [phone]

## 2022-02-07 NOTE — Progress Notes (Incomplete Revision)
Inpatient Rehabilitation Care Coordinator Discharge Note   Patient Details  Name: [name] MRN: [id number] Date of Birth: February 17, 1946   Discharge location: D/c to home with support from his wife and son  Length of Stay: 43 days  Discharge activity level: Contact Guard  Home/community participation: Limited  Patient response ES:PQZRAQ Literacy - How often do you need to have someone help you when you read instructions, pamphlets, or other written material from your doctor or pharmacy?: Never  Patient response TM:AUQJFH Isolation - How often do you feel lonely or isolated from those around you?: Patient unable to respond  Services provided included: MD, RD, PT, OT, SLP, RN, CM, TR, Pharmacy, Neuropsych, SW  Financial Services:  Charity fundraiser Utilized: Medicare  Choices offered to/list presented to: Yes  Follow-up services arranged:  Home Health, DME, Other (Comment) (Enteral Feeds- [employer] (Nutren 1.5 will be substituted)) Home Health Agency: [facility] for HHPT/OT/SLP/aide/SN (TF edu and weekly hemoglobin labs)    DME : [facility] for w/c, RW, and 3in1 BSC  Patient response to transportation need: Is the patient able to respond to transportation needs?: Yes In the past 12 months, has lack of transportation kept you from medical appointments or from getting medications?: No In the past 12 months, has lack of transportation kept you from meetings, work, or from getting things needed for daily living?: No  Comments (or additional information):  Patient/Family verbalized understanding of follow-up arrangements:  Yes  Individual responsible for coordination of the follow-up plan: contact [name]  Confirmed correct DME delivered: [name] 02/07/2022    [name]

## 2022-02-07 NOTE — Progress Notes (Signed)
Speech Language Pathology Daily Session Note  Patient Details  Name: [name] MRN: [id number] Date of Birth: [birth year]  Today's Date: 02/07/2022 SLP Individual Time: 0720-0800 SLP Individual Time Calculation (min): 40 min  Short Term Goals: Week 6: SLP Short Term Goal 1 (Week 6): STGs=LTGs due to ELOS  Skilled Therapeutic Interventions: Skilled treatment session focused on cognitive and dysphagia goals. Upon arrival, patient was awake in enclosure bed. SLP facilitated session by providing overall Mod A verbal cues for safety during transfer to the wheelchair. Patient provided oral care via the suction toothbrush and consumed trials of ice chips and thin liquids via tsp. Patient with increased overt s/s of aspiration today, suspect due to decreased attention to task. 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 Patient left upright in wheelchair with soft waist belt in place and all needs within reach. Continue with current plan of care.      Pain Pain Assessment Pain Scale: 0-10 Pain Score: 0-No pain  Therapy/Group: Individual Therapy  Lakea Mittelman 02/07/2022, 3:48 PM

## 2022-02-07 NOTE — TOC Benefit Eligibility Note (Signed)
Patient Advocate Encounter  Prior Authorization for QUEtiapine Fumarate 100MG  tablets has been approved.     Effective dates: 02/07/2022 through 02/07/2023      Lyndel Safe, [place] Patient Advocate Specialist [place] Patient Advocate Team Direct Number: [phone]  Fax: [phone]

## 2022-02-07 NOTE — Progress Notes (Addendum)
Physical Therapy Session Note  Patient Details  Name: [name] MRN: [id number] Date of Birth: [birth year]  Today's Date: 02/07/2022 PT Individual Time: 1100-1200 and 1400-1440 PT Individual Time Calculation (min): 60 min and 40 min   Short Term Goals: Week 5:  PT Short Term Goal 1 (Week 5): STG=LTG due to ELOS  Skilled Therapeutic Interventions/Progress Updates:     Session 1: Patient in TIS w/c playing cards with his son, Rob, upon PT arrival. Patient alert and agreeable to PT session. Patient denied pain during session.  Patient reported dizziness, SOB, and fatigue with mobility throughout session, MD made aware. B thigh high TED hose and abdominal binder donned throughout session.   Rob participated in Chief Financial Officer throughout session in preparation for patient d/c home tomorrow. Rob demonstrated safe guarding and assist throughout. Educated on signs and symptoms of anemia, stroke, and AMS indicating calling emergency services. Educated on fall risk/prevention, home modifications to prevent falls, and activation of emergency services in the event of a fall during session. Recommended patient not get up from a fall without emergency services at this time due to concerns of symptoms with changes in position leading to risk of recurrent fall. Discussed providing patient with personal time with use of alarms and ways to alert family of needs, Rob states this has been prepared for at home along with 2 person 24/7 supervision and respite assistance for caregivers.   Therapeutic Activity: Transfers: Patient performed stand pivot TIS w/c<>manual w/c with CGA-min A for steadying support with RW. He performed sit to/from stand x2 with CGA using RW. Rob provided verbal cues for hand placement and use of RW appropriately. Patient performed a simulated sedan height car transfer with min-mod A x1 and min A-CGA x1 using RW. For assessment of patient recall and demonstration of patient's impulsivity  to family. Patient instructed to set up w/c for transfer and propelled with his feet toward the car then began to stand up and reach for the car frame from the w/c with breaks off, required increased assist and cues to return to sitting and education on safety awareness. Educated Rob to control impulsivity by brining patient to the car and setting up the transfer with the RW with clear instructions prior to the transfer. Patient requires clear cues as he attempted to step into the car and needed redirection to turn and sit for increased safety with transfers.   Gait Training:  Patient ambulated >100 feet using RW with CGA-min A. Ambulated with tuncal ataxia, impulsive gait speed, forward trunk flexion, and decreased R foot clearance. Provided verbal cues for erect posture, controled gait speed, safe proximity to RW, and attention to task. Patient ascended/descended 12x6" steps using L rail with CGA-min A. Performed reciprocal gait pattern throughout. Provided cues for safe technique and sequencing.   Wheelchair Mobility:  Patient propelled wheelchair ~50 feet using B upper extremities with min A due to veering R and R upper limb ataxia with propulsion. He then propelled >200 feet and >50 feet with supervision using B lower extremities. Provided verbal cues for safety awareness and path finding.  He propelled up/down a ramp using B lower extremities with CGA, educated on safety awareness with community w/c mobility.   Patient in [place] w/c with Rob in the room at end of session with breaks locked, seat belt alarm set, and all needs within reach.   Session 2: Patient in the open enclosure bed with his BIL, Ruby Cola, in the room upon PT arrival. Patient alert  and agreeable to PT session. Patient denied pain during session. Brent stepped out during session to reduce distraction and returned at end of session.   Focused session on d/c assessment, see d/c note for details.   Therapeutic Activity: Bed Mobility:  Patient performed rolling R/L and supine to/from sit with supervision in a flat bed without use of bed rails. Provided verbal cues for use of elbows to push up to sitting. Transfers: Patient performed sit to/from stand x4 with CGA-min A without an AD. Provided verbal cues for forward weight shift.  Neuromuscular Re-ed: Patient performed the Berg Balance Test: Merrilee Jansky Balance Test Sit to Stand: Able to stand  independently using hands Standing Unsupported: Able to stand 2 minutes with supervision Sitting with Back Unsupported but Feet Supported on Floor or Stool: Able to sit 2 minutes under supervision Stand to Sit: Controls descent by using hands Transfers: Needs one person to assist Standing Unsupported with Eyes Closed: Able to stand 10 seconds with supervision Standing Ubsupported with Feet Together: Needs help to attain position but able to stand for 30 seconds with feet together From Standing, Reach Forward with Outstretched Arm: Loses balance while trying/requires external support From Standing Position, Pick up Object from Floor: Unable to try/needs assist to keep balance From Standing Position, Turn to Look Behind Over each Shoulder: Needs assist to keep from losing balance and falling Turn 360 Degrees: Needs assistance while turning Standing Unsupported, Alternately Place Feet on Step/Stool: Needs assistance to keep from falling or unable to try Standing Unsupported, One Foot in Front: Loses balance while stepping or standing Standing on One Leg: Unable to try or needs assist to prevent fall Total Score: 17/56 Patient demonstrated increased fall risk noted by score of 17/56 on the Berg Balance Scale. Improved from 0/56 at evaluation. <45/56 = fall risk, <42/56 = predictive of recurrent falls, <40/56 = 100% fall risk  >41 = independent, 21-40 = assistive device, 0-20 = wheelchair level  MDC 6.9 (4 pts 45-56, 5 pts 35-44, 7 pts 25-34) (ANPTA Core Set of Outcome Measures for Adults with  Neurologic Conditions, 2018)  Patient continues to endorse fatigue and dizziness with mobility.  Vitals: Sitting: BP 102/51, HR 66 Standing: BP 97/52, HR 70  Patient in TIS w/c with Ruby Cola in the room at end of session with breaks locked, seat belt alarm set, and all needs within reach.   Handouts for PT discharge instructions and ABI resources from [employer] provided and placed in patient's CIR notebook following session.   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

## 2022-02-08 ENCOUNTER — Other Ambulatory Visit (HOSPITAL_COMMUNITY): Payer: Self-pay

## 2022-02-08 NOTE — Progress Notes (Signed)
Speech Language Pathology Discharge Summary  Patient Details  Name: [name] MRN: [id number] Date of Birth: May 06, 1946  Patient has met 6 of 6 long term goals.  Patient to discharge at overall Mod level.   Reasons goals not met: N/A  Clinical Impression/Discharge Summary:  Patient has made functional but inconsistent gains and has met 6 of 6 LTGS this admission due to significant and frequent medical issues. Currently, patient remains NPO as he continues to demonstrate a severe pharyngeal dysphagia resulting in diffuse pharyngeal residue with eventual aspiration. Patient is currently consuming ice chips and water via tsp for pleasure/comfort with Min verbal cues needed for use of swallowing compensatory strategies. Teaching formal pharyngeal strengthening exercises have been unsuccessful thus far due to moderate-severe cognitive impairments. Patient's overall moderate-severe cognitive impairments are characterized by intermittent confusion with language of confusion, decreased orientation, decreased sustained attention, decreased emergent awareness, decreased problem solving, and decreased recall resulting in moderate verbal cues needed to complete functional and familiar tasks safely. Patient also demonstrates moderate phonemic paraphasias with intermittent decreased intelligibility due to imprecise consonants which is impacted by fatigue and attention. Patient and family have participated in extensive education and can provide 24 hour supervision at discharge.  Patient would benefit from f/u home health SLP services to maximize his cognitive-linguistic and swallowing function in order to reduce caregiver burden.   Care Partner:  Caregiver Able to Provide Assistance: Yes  Type of Caregiver Assistance: Physical;Cognitive  Recommendation:  Home Health SLP;24 hour supervision/assistance  Rationale for SLP Follow Up: Reduce caregiver burden;Maximize functional communication;Maximize cognitive  function and independence;Maximize swallowing safety   Equipment: Patient is NPO but has had all supplies delivered to his home for proper management/tube feeds   Reasons for discharge: Discharged from hospital;Treatment goals met   Patient/Family Agrees with Progress Made and Goals Achieved: Yes    Sophia Sperry, [place] 02/08/2022, 6:52 AM

## 2022-02-08 NOTE — Plan of Care (Signed)
Problem: RH Cognition - SLP °Goal: RH LTG Patient will demonstrate orientation with cues °Description:  LTG:  Patient will demonstrate orientation to person/place/time/situation with cues (SLP)   °Outcome: Completed/Met °  °Problem: RH Expression Communication °Goal: LTG Patient will verbally express basic/complex needs(SLP) °Description: LTG:  Patient will verbally express basic/complex needs, wants or ideas with cues  (SLP) °Outcome: Completed/Met °  °Problem: RH Problem Solving °Goal: LTG Patient will demonstrate problem solving for (SLP) °Description: LTG:  Patient will demonstrate problem solving for basic/complex daily situations with cues  (SLP) °Outcome: Completed/Met °  °Problem: RH Memory °Goal: LTG Patient will use memory compensatory aids to (SLP) °Description: LTG:  Patient will use memory compensatory aids to recall biographical/new, daily complex information with cues (SLP) °Outcome: Completed/Met °  °Problem: RH Awareness °Goal: LTG: Patient will demonstrate awareness during functional activites type of (SLP) °Description: LTG: Patient will demonstrate awareness during functional activites type of (SLP) °Outcome: Completed/Met °  °Problem: RH Swallowing °Goal: LTG Patient will participate in dysphagia therapy to increase swallow function with assistance (SLP) °Description: LTG:  Patient will participate in dysphagia therapy to increase swallow function with assistance (SLP) °Outcome: Completed/Met °  °

## 2022-02-08 NOTE — Progress Notes (Signed)
Alert and oriented, able to follow commands. Denies any pain or discomfort. Peg-tuber remains patent; no issues. Reviewed medications and feeding/flush schedule with patient and wife. Discharge instructions provided by [name], [place]. Staff assisted patient off the unit and into private care. Patient discharged with no issues.

## 2022-02-11 ENCOUNTER — Telehealth: Payer: Self-pay | Admitting: Registered Nurse

## 2022-02-11 NOTE — Telephone Encounter (Signed)
This provider was in contact with [name] PA:  Clarification for Seroquel  Seroquel 25 mg at 7am and 4:00 pm  Seroquel 100mg  at 21:00 Placed a call to Ms. Gossett regarding the above she verbalizes understanding.   Sent an email to [employer] regarding prosource: she stated nursing was to review with patient. Ms. Busby stated no one reviewed the prosource with her, This provider asked Auria to have nursing review the prosource with Ms. Ms. Lederman.  Ms.Noblet was instructed to call office if no one calls her by noon on tomorrow, she verbalizes understanding.

## 2022-02-11 NOTE — Telephone Encounter (Signed)
Transitional Care call  Transitional questions answered by [name]   Patient name: [name] DOB: 02/11/2021 Are you/is patient experiencing any problems since coming home? No Are there any questions regarding any aspect of care? [name] had questions regarding Seroquel dose at 21:00 : 100 mg dose, it is noted on discharged summary. This provider reached out to [name], he stated yes to seroquel dose 100 mg at 21:00. She also states she was given prosource, she didn't receive any instructions. Sent message to [name] and [name], awaiting a return message, [name] will called with answers, she verbalizes understanding.  Are there any questions regarding medications administration/dosing? See above.  Are meds being taken as prescribed? Yes Patient should review meds with caller to confirm Medication list was reviewed. Have there been any falls? No Has Home Health been to the house and/or have they contacted you? Yes, [facility], called [name] this morning.  If not, have you tried to contact them? NA: See above. Can we help you contact them? NA Are bowels and bladder emptying properly? Yes Are there any unexpected incontinence issues? No If applicable, is patient following bowel/bladder programs? NA Any fevers, problems with breathing, unexpected pain? No Are there any skin problems or new areas of breakdown? No Has the patient/family member arranged specialty MD follow up (ie cardiology/neurology/renal/surgical/etc.)?  Ms. Pham was instructed to call [name], [name],  and [name] office to scheduled HFU appointment. This provider called [facility], they will call [name] to scheduled HFU  appointment.  Can we help arrange? See above  Does the patient need any other services or support that we can help arrange? No Are caregivers following through as expected in assisting the patient? Yes Has the patient quit smoking,  drinking alcohol, or using drugs as recommended? (                        )  Appointment date/time : Changed to 02/22/2022: They live over 90 minutes away, and they have another doctors appointment in [place].  Appointment: 02/22/2021  arrival time 12:45 for 1:00 appointment with [name] at  [place]

## 2022-02-12 NOTE — Telephone Encounter (Signed)
The [facility] called back. So far they have not heard back from the discharging staff about the Pro Source. [name] has been advised to call back if no reply by tomorrow morning.

## 2022-02-13 ENCOUNTER — Telehealth: Payer: Self-pay | Admitting: *Deleted

## 2022-02-13 NOTE — Care Management Note (Signed)
Spoke with Mrs. Rasheed and explained that the dietician ordered the Pro Source as a dietary supplement. I explained that it is the same process of giving a medication and it was ordered TID. Mrs. Antonio stated verbal understanding of how to give the [place] and had no other questions concerning this supplement. She still states concerns about running low on the tube feed formula, as she only received 18 cartons out of 160 carton order. She spoke with [facility] and stated they were looking into other options as they are out of Nutren 1.5. She asked if she could call around to medical supply stores to see if she could locate supply near her, and this RN said that she could attempt that option. This RN stated she could call back with further questions if needed.  Dorthula Nettles, RN3, BSN, CBIS, [place], [facility], Inpatient Rehabilitation Office [phone] Cell [phone]

## 2022-02-13 NOTE — Telephone Encounter (Signed)
Supplier is out of his nutritional supplement Jevity. They are going to see what they can substitute and will call if order needed. ?

## 2022-02-13 NOTE — Progress Notes (Deleted)
Patient ID: Preet Perrier, male   DOB: [birth year], 76 y.o.   MRN: [id number]  Spoke with Mrs. Warn and explained that the dietician ordered the Pro Source as a dietary supplement. I explained that it is the same process of giving a medication and it was ordered TID. Mrs. Gidley stated verbal understanding of how to give the [place] and had no other questions concerning this supplement. She still states concerns about running low on the tube feed formula, as she only received 18 cartons out of 160 carton order. She spoke with [facility] and stated they were looking into other options as they are out of Nutren 1.5. She asked if she could call around to medical supply stores to see if she could locate supply near her, and this RN said that she could attempt that option. This RN stated she could call back with further questions if needed.  Dorthula Nettles, RN3, BSN, CBIS, [place], [facility], Inpatient Rehabilitation Office [phone] Cell [phone]

## 2022-02-14 ENCOUNTER — Telehealth: Payer: Self-pay

## 2022-02-14 NOTE — Telephone Encounter (Signed)
02/13/2022- SW left message for AmyAllen/[facility] enteral feed dept [phone] option #5, ext [phone]/f: [phone]) to discuss issue with TF after speaking with pt wife who reported they only receive 18 of 186 cartons of Nutren 1.5 and this item being on back order. SW waiting on follow-up. SW spoke with nursing- Erline Levine to call pt wife to discuss how to use Prosource. See note added by nursing on 02/13/22.  ? ? ?02/14/2022-SW returned call to Amy/[facility] to discuss ordering TF. Initial order indicates Nutren 1.5 or equivalent. Will supply pt with Isosource 1.5 instead. States shipment will take 2-3 days to receive. SW spoke with pt wife [name] to discuss above. Reports she was able to get some cartons from a friend that will hold them over until shipment arrives. Still has large Jevity containers provided at discharge. She is aware [facility] will follow-up to discuss.  ? ? ?[name], MSW, LCSWA ?Office: [phone] ?Cell: [phone] ?Fax: [phone]  ?

## 2022-02-20 ENCOUNTER — Telehealth: Payer: Self-pay | Admitting: Cardiology

## 2022-02-20 ENCOUNTER — Other Ambulatory Visit: Payer: Self-pay | Admitting: *Deleted

## 2022-02-20 DIAGNOSIS — Z8679 Personal history of other diseases of the circulatory system: Secondary | ICD-10-CM | POA: Insufficient documentation

## 2022-02-20 NOTE — Telephone Encounter (Signed)
Dr. Alexandria Lodge calling to speak w/ Dr. Quentin Ore ?

## 2022-02-21 ENCOUNTER — Encounter: Payer: Medicare Other | Admitting: Registered Nurse

## 2022-02-22 ENCOUNTER — Telehealth: Payer: Self-pay | Admitting: *Deleted

## 2022-02-22 ENCOUNTER — Other Ambulatory Visit: Payer: Self-pay

## 2022-02-22 ENCOUNTER — Encounter: Payer: Medicare Other | Attending: Registered Nurse | Admitting: Registered Nurse

## 2022-02-22 VITALS — BP 106/71 | HR 65 | Ht 70.0 in | Wt 196.0 lb

## 2022-02-22 DIAGNOSIS — I480 Paroxysmal atrial fibrillation: Secondary | ICD-10-CM

## 2022-02-22 DIAGNOSIS — R1310 Dysphagia, unspecified: Secondary | ICD-10-CM

## 2022-02-22 DIAGNOSIS — R319 Hematuria, unspecified: Secondary | ICD-10-CM

## 2022-02-22 DIAGNOSIS — I639 Cerebral infarction, unspecified: Secondary | ICD-10-CM

## 2022-02-22 NOTE — Telephone Encounter (Signed)
I spoke to [place] and the Nutren and [name] is back ordered until estimated release of 03/11/22 for both. They have been able to obtain Jevity 1.5 which is allowed substitution 196 /month= 6 1/3 cartons per day. They will ship it out to patient today or Monday and will take a couple of days to reach him. ?

## 2022-02-22 NOTE — Patient Instructions (Signed)
Send a My- Chart Message on Monday 02/25/2022 ?

## 2022-02-22 NOTE — Progress Notes (Signed)
Subjective:    Patient ID: [name], male    DOB: 22-Jan-1946, 76 y.o.   MRN: [id number]  HPI: [name] is a 76 y.o. male who is here or Transitional Care Visit for Follow up of his Cerebellar Infarction, PAF Paroxysmal atrial fibrillation and hematuria. He presented to [name] ED on 11/25/2021 with ataxia.   Dr. [name] H&P on 11/25/2021  HPI: [name] is a 76 y.o. male with a PMHx of CAD s/p CABG x4, HLD, OA, nephrolithiasis, urinary retention, BPH with LUTS and prostate cancer who presented by wife. Wife states they were at a hotel (visiting from [place], [place]) this am and husband was in his normal state of health when he awoke at 0500 hours. They went to parking lot at 1000 hours to move cars. Wife then noted the patient to be leaning over against the car and he stated he could not move. The family present are MDs and evaluated patient and told patient to go to hospital.    Upon arrival, he was ataxic with gait, had n/v several times, and was experiencing vertigo. He also c/o left abdominal pain and knows he has a bladder stone with cystoscopy earlier this month. No hematuria since procedure. No history of vertigo or vestibular problems. Code stroke was called by ED MD.    Upon arrival in CT suite, the patient had difficulty standing from wheelchair and rotating to bed. He needed assistance x 2. He had pallor and was diaphoretic. He was c/o left lower abdominal pain and vomited several times while in CT suite. CTH showed no acute finding and his NIHSS was 0. TNK was not offered at that time. Patient went stat to MRI brain which was positive for findings consistent with bilateral cerebellar strokes.    Upon re-examining, he was noted to have developed ataxia in BL uppers and significantly in BL lower extremities. Given patient was still in TNK window, we returned to ED room and Dr. [name] had a discussion with patient and wife about TNK and risk of bleeding. Patient has a  history of hematuria with history of passing clots in his urine along with history of blood spots in his stool. Patient's wife understood his increased risk of GI and GU bleeding in addition to the risk of ICH from [facility]. Felt that his stroke would be debilitating and this wife hesitantly agree to tNKASE. Patient's BP was over 200 after Hydralazine '5mg'$  IV. Hydralazine '15mg'$  IV was then given. BP did not decrease to TNK parameters, so Cleviprex was started. When BP in parameters, TNK was immediately given at 1214 and patient tolerated well. His Cleviprex was increased and his BP remained below parameters with TNK.    After TNK administration, patient returned urgently to CT suite for CTA head and neck which did not show LVO. Patient was taken back to ED and will be admitted to ICU overnight secondary to TNK administration.   CT Head WO Contrast:  IMPRESSION: 1. Normal for age non contrast CT appearance of the brain. ASPECTS 10.  MR Brain: WO Contrast IMPRESSION: Acute infarct in the cerebellum bilaterally. Negative for hemorrhage.  CT Angio: Head and Neck:  IMPRESSION: 1. Negative for intracranial large vessel occlusion. No significant basilar stenosis or thrombus. 2. Suboptimal arterial opacification due to scan timing. 3. Mild atherosclerotic disease in the carotid bifurcation bilaterally. Negative for carotid or vertebral artery stenosis.  Neurology, Neurosurgery and Cardiology was consulted   He underwent on 11/26/2022: Dr. Shella Spearing  CRANIECTOMY  He underwent : See Below on 12/13/2021 by Dr [name]      Procedure Laterality Anesthesia  ESOPHAGOGASTRODUODENOSCOPY (EGD) N/A Moderate Sedation  PERCUTANEOUS ENDOSCOPIC GASTROSTOMY (PEG) PLACEMENT       Eliquis was initiated for new onset atrial fibrillation, with persistent hematuria Eliquis was changed to aspirin and Plavix, again due to persistent bleeding it had to be discontinued. Cardiology following  for evaluation  of Watchman per discharge summary [name] PA-C.   [name] was admitted to inpatient rehabilitation on 12/25/2021 and discharged home on 02/07/2022. He is receiving Home Health Therapy with [facility].   He is receiving Tube Feeds: Due to shortage his Enteral Feeds was changed  from Isosource to Nutren to Jevity: see Education officer, museum note. [name] will call [facility] today. [name] states she only has 15 day supply. This provider will F/U on the above and e-mail was sent to [name] SW to assist, she verbalizes understanding.   He denies any pain. He rates his pain 0.   Arrived in wheelchair, walking in the home with walker.    Pain Inventory Average Pain 0 Pain Right Now 0 My pain is  no pain  LOCATION OF PAIN  no pain  BOWEL Number of stools per week: 7 Oral laxative use No  Type of laxative na Enema or suppository use No  History of colostomy No  Incontinent Yes   BLADDER Pads In and out cath, frequency na Able to self cath  na Bladder incontinence Yes  Frequent urination Yes  Leakage with coughing Yes  Difficulty starting stream Yes  Incomplete bladder emptying Yes    Mobility use a walker ability to climb steps?  yes do you drive?  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: date disabled 11/25/2021 I need assistance with the following:  feeding, dressing, bathing, toileting, meal prep, household duties, and shopping  Neuro/Psych bladder control problems trouble walking confusion  Prior Studies Hospital f/u  Physicians involved in your care Hospital f/u   Family History  Problem Relation Age of Onset   Cancer Brother    Coronary artery disease Brother    Social History   Socioeconomic History   Marital status: Married    Spouse name: Not on file   Number of children: Not on file   Years of education: Not on file   Highest education  level: Not on file  Occupational History   Not on file  Tobacco Use   Smoking status: Former    Types: Cigarettes   Smokeless tobacco: Never  Substance and Sexual Activity   Alcohol use: Yes    Alcohol/week: 3.0 standard drinks    Types: 3 Cans of beer per week   Drug use: Never   Sexual activity: Not on file  Other Topics Concern   Not on file  Social History Narrative   Not on file   Social Determinants of Health   Financial Resource Strain: Not on file  Food Insecurity: Not on file  Transportation Needs: Not on file  Physical Activity: Not on file  Stress: Not on file  Social Connections: Not on file   Past Surgical History:  Procedure Laterality Date   CORONARY ARTERY BYPASS GRAFT  2012   ESOPHAGOGASTRODUODENOSCOPY N/A 12/13/2021   Procedure: ESOPHAGOGASTRODUODENOSCOPY (EGD);  Surgeon: Jesusita Oka, MD;  Location[facility] ENDOSCOPY;  Service: General;  Laterality: N/A;  IR REPLC GASTRO/COLONIC TUBE PERCUT W/FLUORO  01/29/2022   PEG PLACEMENT N/A 12/13/2021   Procedure: PERCUTANEOUS ENDOSCOPIC GASTROSTOMY (PEG) PLACEMENT;  Surgeon: Jesusita Oka, MD;  Location: [place] ENDOSCOPY;  Service: General;  Laterality: N/A;   SUBOCCIPITAL CRANIECTOMY CERVICAL LAMINECTOMY N/A 11/26/2021   Procedure: SUBOCCIPITAL CRANIECTOMY;  Surgeon: Judith Part, MD;  Location: [place];  Service: Neurosurgery;  Laterality: N/A;   Past Medical History:  Diagnosis Date   CAD (coronary artery disease)    a. s/p CABG in 2012   Hyperlipidemia    BP 106/71    Pulse 65    Ht '5\' 10"'$  (1.778 m)    Wt 196 lb (88.9 kg)    SpO2 92%    BMI 28.12 kg/m   Opioid Risk Score:   Fall Risk Score:  `1  Depression screen PHQ 2/9  Depression screen PHQ 2/9 02/22/2022  Decreased Interest 1  Down, Depressed, Hopeless 0  PHQ - 2 Score 1  Altered sleeping 0  Tired, decreased energy 1  Change in appetite 0  Feeling bad or failure about yourself  0  Trouble concentrating 0  Moving slowly or  fidgety/restless 0  Suicidal thoughts 0  PHQ-9 Score 2  Difficult doing work/chores Extremely dIfficult      Review of Systems  Musculoskeletal:  Positive for gait problem.  Psychiatric/Behavioral:  Positive for confusion.   All other systems reviewed and are negative.     Objective:   Physical Exam Vitals and nursing note reviewed.  Constitutional:      Appearance: Normal appearance.  Cardiovascular:     Rate and Rhythm: Normal rate and regular rhythm.     Pulses: Normal pulses.     Heart sounds: Normal heart sounds.  Pulmonary:     Effort: Pulmonary effort is normal.     Breath sounds: Normal breath sounds.  Abdominal:     General: Bowel sounds are normal.     Palpations: Abdomen is soft.     Comments: Peg Tube Capped: Dressing intact with abdominal binder.   Musculoskeletal:     Cervical back: Normal range of motion and neck supple.     Comments: Normal Muscle Bulk and Muscle Testing Reveals:  Upper Extremities: Right: Decreased ROM 45 Degrees and Muscle Strength 4/5 Left Upper Extremity: Full ROM and Muscle Strength 5/5  Lower Extremities: Full ROM and Muscle Strength 5/5 Arrived in wheelchair     Skin:    General: Skin is warm and dry.  Neurological:     Mental Status: He is alert and oriented to person, place, and time.  Psychiatric:        Mood and Affect: Mood normal.        Behavior: Behavior normal.         Assessment & Plan:  Cerebellar Infarction,Continue Home Health Therapy with [facility]: He has a scheduled appointment with Neurology, Dr Leonie Man . He seen Dr Zada Finders today she states.  Dysphagia: Continue Peg Tub Feeds: [place] Following. Continue to Monitor.   PAF Paroxysmal atrial fibrillation: Continue current medication regimen. PCP placing a Cardiology referral closer to the home Mrs. Paden states.  Hematuria.Urology Following.   F/U in 4- 6 weeks with Dr Naaman Plummer

## 2022-02-25 ENCOUNTER — Encounter: Payer: Self-pay | Admitting: Registered Nurse

## 2022-02-26 ENCOUNTER — Ambulatory Visit: Payer: Medicare Other | Admitting: Cardiology

## 2022-03-01 ENCOUNTER — Other Ambulatory Visit (HOSPITAL_COMMUNITY): Payer: Self-pay

## 2022-03-05 ENCOUNTER — Other Ambulatory Visit (HOSPITAL_COMMUNITY): Payer: Self-pay

## 2022-03-29 ENCOUNTER — Other Ambulatory Visit (HOSPITAL_COMMUNITY): Payer: Self-pay

## 2022-04-02 ENCOUNTER — Other Ambulatory Visit (HOSPITAL_COMMUNITY): Payer: Self-pay

## 2022-04-02 ENCOUNTER — Other Ambulatory Visit: Payer: Self-pay

## 2022-04-02 NOTE — Patient Outreach (Signed)
[facility]) Care Management ? ?04/02/2022 ? ?[name] ?19-Oct-1946 ?[id number] ? ? ?Telephone outreach to patient to obtain mRS was successfully completed. MRS= 5 ? ?[name] ?[place] Management Assistant ?[phone] ? ?

## 2022-04-04 ENCOUNTER — Other Ambulatory Visit: Payer: Self-pay

## 2022-04-04 NOTE — Progress Notes (Signed)
Entered in error

## 2022-04-18 ENCOUNTER — Encounter: Payer: Self-pay | Admitting: Neurology

## 2022-04-18 ENCOUNTER — Ambulatory Visit (INDEPENDENT_AMBULATORY_CARE_PROVIDER_SITE_OTHER): Payer: Medicare Other | Admitting: Neurology

## 2022-04-18 VITALS — BP 120/74 | HR 87 | Ht 70.0 in | Wt 200.0 lb

## 2022-04-18 DIAGNOSIS — Z9889 Other specified postprocedural states: Secondary | ICD-10-CM | POA: Diagnosis not present

## 2022-04-18 DIAGNOSIS — I6789 Other cerebrovascular disease: Secondary | ICD-10-CM | POA: Diagnosis not present

## 2022-04-18 DIAGNOSIS — R270 Ataxia, unspecified: Secondary | ICD-10-CM | POA: Diagnosis not present

## 2022-04-18 DIAGNOSIS — I639 Cerebral infarction, unspecified: Secondary | ICD-10-CM

## 2022-04-18 NOTE — Progress Notes (Signed)
?[facility] ?[address] ?[place]. [place] [address] ?[phone] ? ?     OFFICE FOLLOW-UP NOTE ? ?[name] ?Date of Birth:  August 28, 1946 ?Medical Record Number:  [id number]  ? ?HPI: [name] is a 76 year old pleasant Caucasian male seen today for initial office follow-up visit following hospital admission for stroke in December 2022.  History is obtained from the patient and his wife as well as review of electronic medical records and personally reviewed available pertinent imaging films in PACS.  He has past medical history of coronary artery disease, status post CABG, hypertension, hyperlipidemia, osteoarthritis, nephrolithiasis, benign prostatic hypertrophy, urinary retention and prostate cancer who presented on 11/25/2021 with sudden onset of ataxia with patient leaning over against the car stating he could not move.  Patient went to primary physician's office who advised him to go to the hospital.  Upon arrival patient was found to be ataxic with his gait and nausea and vomited several times and complained of vertigo.  Patient was also complaining of abdominal pain and he had a history of kidney stones and had a cystoscopy few days ago risk-benefit of bleeding was discussed with the patient and his wife and there was some delay in decision making with TNK was administered his blood pressure was also about 200 and required IV hydralazine and starting Cleviprex drip for control.  CT angiogram of the head and neck was performed which did not show any LVO.  MRI scan of the brain showed acute infarcts in bilateral cerebellum posterior lateral portions.  Patient was monitored in the ICU subsequently he developed worsening exam and repeat MRI showed mass effect on the fourth ventricle neurosurgery was consulted and he was taken for suboccipital craniectomy on 11/27/2021.  Patient was subsequently required ventilatory support and repeat CT scan on 11/28/2021 showed hemorrhagic  transformation of the prior stroke.  He required tracheostomy and PEG tube placement.  He has had a prolonged ICU course complicated by agitation requiring sedation.  He was subsequently transferred to inpatient rehab and has made gradual improvement is currently living at home since February 24.  He is able to ambulate with a walker but requires a 1 person assist.  He still is ataxic and off balance and is difficulty controlling his right arm and leg.  He is finished home physical occupational therapy and plans to start outpatient therapy next week.  Speech therapy is continuing to follow him but he still needs by mouth and is still using his PEG tube.  He remains on Eliquis 5 mg twice daily which is had to temporarily stop due to hematuria but has been restarted and he has plans to see urology soon.  His blood pressure is well controlled today it is 120/74.  He is tolerating Lipitor well without muscle aches and pains. ?ROS:   ?14 system review of systems is positive for gait difficulty, dizziness, imbalance, swallowing difficulty, bruising, hematuria all other systems negative ? ?PMH:  ?Past Medical History:  ?Diagnosis Date  ? CAD (coronary artery disease)   ? a. s/p CABG in 2012  ? Hyperlipidemia   ? ? ?Social History:  ?Social History  ? ?Socioeconomic History  ? Marital status: Married  ?  Spouse name: Not on file  ? Number of children: Not on file  ? Years of education: Not on file  ? Highest education level: Not on file  ?Occupational History  ? Not on file  ?Tobacco Use  ? Smoking status: Former  ?  Types: Cigarettes  ? Smokeless tobacco: Never  ?Substance and Sexual Activity  ? Alcohol use: Yes  ?  Alcohol/week: 3.0 standard drinks  ?  Types: 3 Cans of beer per week  ? Drug use: Never  ? Sexual activity: Not on file  ?Other Topics Concern  ? Not on file  ?Social History Narrative  ? Not on file  ? ?Social Determinants of Health  ? ?Financial Resource Strain: Not on file  ?Food Insecurity: Not on file   ?Transportation Needs: Not on file  ?Physical Activity: Not on file  ?Stress: Not on file  ?Social Connections: Not on file  ?Intimate Partner Violence: Not on file  ? ? ?Medications:   ?Current Outpatient Medications on File Prior to Visit  ?Medication Sig Dispense Refill  ? acetaminophen (TYLENOL) 160 MG/5ML solution Place 20.3 mLs (650 mg total) into feeding tube every 4 (four) hours as needed for mild pain (or temp > 37.5 C (99.5 F)). 120 mL 0  ? atorvastatin (LIPITOR) 80 MG tablet Place 1 tablet (80 mg total) into feeding tube daily. 30 tablet 0  ? butalbital-acetaminophen-caffeine (FIORICET) 50-325-40 MG tablet Place 1 tablet into feeding tube every 8 (eight) hours as needed for headache. 14 tablet 0  ? citalopram (CELEXA) 20 MG tablet Place 1 tablet (20 mg total) into feeding tube at bedtime. 30 tablet 0  ? diclofenac Sodium (VOLTAREN) 1 % GEL Apply 2 g topically 4 (four) times daily. 100 g 0  ? ferrous sulfate 220 (44 Fe) MG/5ML solution Place 5 mLs (220 mg total) into feeding tube daily with breakfast. 150 mL 3  ? losartan (COZAAR) 25 MG tablet Place 1 tablet (25 mg total) into feeding tube daily. 30 tablet 0  ? Nutritional Supplements (FEEDING SUPPLEMENT, JEVITY 1.5 CAL/FIBER,) LIQD Place 356 mLs into feeding tube 4 (four) times daily.    ? omeprazole (PRILOSEC) 20 MG capsule Open 1 capsule, suspend in water, and give contents per tube once daily. 30 capsule 0  ? oxyCODONE (OXY IR/ROXICODONE) 5 MG immediate release tablet Place 1/2 tablet (2.5 mg total) into feeding tube every 6 (six) hours as needed for moderate pain or severe pain. 30 tablet 0  ? QUEtiapine (SEROQUEL) 100 MG tablet Take 100 mg by mouth at bedtime. At 9:00 pm    ? QUEtiapine (SEROQUEL) 25 MG tablet Take 1 tablet by mouth at 7:00 AM and 4:00 PM. 180 tablet 0  ? traZODone (DESYREL) 150 MG tablet Place 1/2 tablet (75 mg total) into feeding tube at bedtime. 30 tablet 0  ? Water For Irrigation, Sterile (FREE WATER) SOLN Place 250 mLs into  feeding tube every 4 (four) hours.    ? ?No current facility-administered medications on file prior to visit.  ? ? ?Allergies:  No Known Allergies ? ?Physical Exam ?General: well developed, well nourished, elderly Caucasian male seated, in no evident distress ?Head: head normocephalic and atraumatic.  ?Neck: supple with no carotid or supraclavicular bruits tracheostomy scar noted in the neck ?Cardiovascular: regular rate and rhythm, no murmurs ?Musculoskeletal: no deformity ?Skin:  no rash/petichiae .he has a PEG tube in his abdomen. ?Vascular:  Normal pulses all extremities ?Vitals:  ? 04/18/22 1127  ?BP: 120/74  ?Pulse: 87  ? ?Neurologic Exam ?Mental Status: Awake and fully alert. Oriented to place and time. Recent and remote memory intact. Attention span, concentration and fund of knowledge appropriate. Mood and affect appropriate.  ?Cranial Nerves: Fundoscopic exam reveals sharp disc margins. Pupils equal, briskly reactive to light.  Extraocular movements full without nystagmus.  But with saccadic dysmetria on lateral gaze.  Visual fields full to confrontation. Hearing intact. Facial sensation intact. Face, tongue, palate moves normally and symmetrically.  ?Motor: Normal bulk and tone. Normal strength in all tested extremity muscles. ?Sensory.: intact to touch ,pinprick .position and vibratory sensation.  ?Coordination: Mild right finger-to-nose and knee to heel ataxia.Marland Kitchen ?Gait and Station: Arises from chair without difficulty. Stance is broad-based. Gait demonstrates ataxia and uses a walker.  Unable to perform tandem walk without difficulty.  ?Reflexes: 1+ and symmetric. Toes downgoing.  ? ?NIHSS  2 ?Modified Rankin  3-4 ? ? 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  Vascular risk factors of atrial flutter, coronary artery disease, status post CABG and hypertension and hyperlipidemia ? ? ? ? ?PLAN: I had a long

## 2022-04-18 NOTE — Patient Instructions (Signed)
I had a long d/w patient and his wife about his recent cerebellar strokes, suboccipital craniectomy, atrial fibrillation, risk for recurrent stroke/TIAs, personally independently reviewed imaging studies and stroke evaluation results and answered questions.Continue Eliquis (apixaban) 5 mg twice daily  for secondary stroke prevention and maintain strict control of hypertension with blood pressure goal below 130/90, diabetes with hemoglobin A1c goal below 6.5% and lipids with LDL cholesterol goal below 70 mg/dL. I also advised the patient to eat a healthy diet with plenty of whole grains, cereals, fruits and vegetables, exercise regularly and maintain ideal body weight .he was advised to use a walker at all times and we discussed fall safety precautions.  Continue outpatient Physical and Occupational Therapy.  He was advised to keep his appointment with the urologist to discuss his incontinence and hematuria.  Followup in the future with me in 3 months or call earlier if necessary. ?Stroke Prevention ?Some medical conditions and behaviors can lead to a higher chance of having a stroke. You can help prevent a stroke by eating healthy, exercising, not smoking, and managing any medical conditions you have. ?Stroke is a leading cause of functional impairment. Primary prevention is particularly important because a majority of strokes are first-time events. Stroke changes the lives of not only those who experience a stroke but also their family and other caregivers. ?How can this condition affect me? ?A stroke is a medical emergency and should be treated right away. A stroke can lead to brain damage and can sometimes be life-threatening. If a person gets medical treatment right away, there is a better chance of surviving and recovering from a stroke. ?What can increase my risk? ?The following medical conditions may increase your risk of a stroke: ?Cardiovascular disease. ?High blood pressure (hypertension). ?Diabetes. ?High  cholesterol. ?Sickle cell disease. ?Blood clotting disorders (hypercoagulable state). ?Obesity. ?Sleep disorders (obstructive sleep apnea). ?Other risk factors include: ?Being older than age 73. ?Having a history of blood clots, stroke, or mini-stroke (transient ischemic attack, TIA). ?Genetic factors, such as race, ethnicity, or a family history of stroke. ?Smoking cigarettes or using other tobacco products. ?Taking birth control pills, especially if you also use tobacco. ?Heavy use of alcohol or drugs, especially cocaine and methamphetamine. ?Physical inactivity. ?What actions can I take to prevent this? ?Manage your health conditions ?High cholesterol levels. ?Eating a healthy diet is important for preventing high cholesterol. If cholesterol cannot be managed through diet alone, you may need to take medicines. ?Take any prescribed medicines to control your cholesterol as told by your health care provider. ?Hypertension. ?To reduce your risk of stroke, try to keep your blood pressure below 130/80. ?Eating a healthy diet and exercising regularly are important for controlling blood pressure. If these steps are not enough to manage your blood pressure, you may need to take medicines. ?Take any prescribed medicines to control hypertension as told by your health care provider. ?Ask your health care provider if you should monitor your blood pressure at home. ?Have your blood pressure checked every year, even if your blood pressure is normal. Blood pressure increases with age and some medical conditions. ?Diabetes. ?Eating a healthy diet and exercising regularly are important parts of managing your blood sugar (glucose). If your blood sugar cannot be managed through diet and exercise, you may need to take medicines. ?Take any prescribed medicines to control your diabetes as told by your health care provider. ?Get evaluated for obstructive sleep apnea. Talk to your health care provider about getting a sleep evaluation if  you snore a lot or have excessive sleepiness. ?Make sure that any other medical conditions you have, such as atrial fibrillation or atherosclerosis, are managed. ?Nutrition ?Follow instructions from your health care provider about what to eat or drink to help manage your health condition. These instructions may include: ?Reducing your daily calorie intake. ?Limiting how much salt (sodium) you use to 1,500 milligrams (mg) each day. ?Using only healthy fats for cooking, such as olive oil, canola oil, or sunflower oil. ?Eating healthy foods. You can do this by: ?Choosing foods that are high in fiber, such as whole grains, and fresh fruits and vegetables. ?Eating at least 5 servings of fruits and vegetables a day. Try to fill one-half of your plate with fruits and vegetables at each meal. ?Choosing lean protein foods, such as lean cuts of meat, poultry without skin, fish, tofu, beans, and nuts. ?Eating low-fat dairy products. ?Avoiding foods that are high in sodium. This can help lower blood pressure. ?Avoiding foods that have saturated fat, trans fat, and cholesterol. This can help prevent high cholesterol. ?Avoiding processed and prepared foods. ?Counting your daily carbohydrate intake. ? ?Lifestyle ?If you drink alcohol: ?Limit how much you have to: ?0-1 drink a day for women who are not pregnant. ?0-2 drinks a day for men. ?Know how much alcohol is in your drink. In the [place] one drink equals one 12 oz bottle of beer (311m), one 5 oz glass of wine (1459m, or one 1? oz glass of hard liquor (4438m ?Do not use any products that contain nicotine or tobacco. These products include cigarettes, chewing tobacco, and vaping devices, such as e-cigarettes. If you need help quitting, ask your health care provider. ?Avoid secondhand smoke. ?Do not use drugs. ?Activity ? ?Try to stay at a healthy weight. ?Get at least 30 minutes of exercise on most days, such as: ?Fast walking. ?Biking. ?Swimming. ?Medicines ?Take  over-the-counter and prescription medicines only as told by your health care provider. Aspirin or blood thinners (antiplatelets or anticoagulants) may be recommended to reduce your risk of forming blood clots that can lead to stroke. ?Avoid taking birth control pills. Talk to your health care provider about the risks of taking birth control pills if: ?You are over 35 35ars old. ?You smoke. ?You get very bad headaches. ?You have had a blood clot. ?Where to find more information ?American Stroke Association: www.strokeassociation.org ?Get help right away if: ?You or a loved one has any symptoms of a stroke. "BE FAST" is an easy way to remember the main warning signs of a stroke: ?B - Balance. Signs are dizziness, sudden trouble walking, or loss of balance. ?E - Eyes. Signs are trouble seeing or a sudden change in vision. ?F - Face. Signs are sudden weakness or numbness of the face, or the face or eyelid drooping on one side. ?A - Arms. Signs are weakness or numbness in an arm. This happens suddenly and usually on one side of the body. ?S - Speech. Signs are sudden trouble speaking, slurred speech, or trouble understanding what people say. ?T - Time. Time to call emergency services. Write down what time symptoms started. ?You or a loved one has other signs of a stroke, such as: ?A sudden, severe headache with no known cause. ?Nausea or vomiting. ?Seizure. ?These symptoms may represent a serious problem that is an emergency. Do not wait to see if the symptoms will go away. Get medical help right away. Call your local emergency services (911 in the [place] Do not  drive yourself to the hospital. ?Summary ?You can help to prevent a stroke by eating healthy, exercising, not smoking, limiting alcohol intake, and managing any medical conditions you may have. ?Do not use any products that contain nicotine or tobacco. These include cigarettes, chewing tobacco, and vaping devices, such as e-cigarettes. If you need help quitting,  ask your health care provider. ?Remember "BE FAST" for warning signs of a stroke. Get help right away if you or a loved one has any of these signs. ?This information is not intended to replace advice given to yo

## 2022-04-19 ENCOUNTER — Telehealth: Payer: Self-pay | Admitting: Registered Nurse

## 2022-04-19 NOTE — Progress Notes (Signed)
Another call was placed to [facility]  ?[phone],  ?Spoke to [employer] , she stated the Prosource was in stock, they will ship it to [name].  ?Placed a call to Ms, Shutter regarding the above, she verbalizes understanding.  ?
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
## 2022-04-26 DIAGNOSIS — N210 Calculus in bladder: Secondary | ICD-10-CM | POA: Insufficient documentation

## 2022-06-03 ENCOUNTER — Telehealth: Payer: Self-pay | Admitting: Neurology

## 2022-06-03 NOTE — Telephone Encounter (Signed)
Rescheduled 8/24 appointment over the phone with pt's wife - MD out

## 2022-06-12 ENCOUNTER — Encounter: Payer: Medicare Other | Attending: Physical Medicine & Rehabilitation | Admitting: Physical Medicine & Rehabilitation

## 2022-06-12 ENCOUNTER — Encounter: Payer: Self-pay | Admitting: Physical Medicine & Rehabilitation

## 2022-06-12 VITALS — BP 115/76 | HR 58 | Ht 70.0 in | Wt 199.0 lb

## 2022-06-12 DIAGNOSIS — I639 Cerebral infarction, unspecified: Secondary | ICD-10-CM | POA: Diagnosis not present

## 2022-06-12 NOTE — Patient Instructions (Addendum)
PLEASE FEEL FREE TO CALL OUR OFFICE WITH ANY PROBLEMS OR QUESTIONS ([phone])   DECREASE SEROQUEL TO '50MG'$  AT BEDTIME.   JUST KEEP THE PEG SITE CLEAN AND DRY.

## 2022-06-12 NOTE — Progress Notes (Signed)
Subjective:    Patient ID: [name], male    DOB: 1946-03-04, 76 y.o.   MRN: [id number]  HPI  [name] is here in follow up of his cerebellar hemorrhage and subsequent surgical decompression. He was with [place] on rehab until late February.   He has come a long way since he left [place]. He likes to read and do puzzles. He leaves the house regullrly to visit a friend who has a PD. He also hangs out with his friends. He continues to be involved in physical, occupational and speech therapy.   He had one fall at home when he fell on to the bed and slipped off the side while he was attempting to use the bedside commode. 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   He has seen cardiology re: watchman device. They will not pursue. He's back on eliquis  He had f/u with urology who found a kidney stone. He has surgery planned for removal in 2 weeks. The stone was likely causing the bleeding  His cognition has improved quite a bit. He's sleeping fairly well.      Pain Inventory Average Pain 0 Pain Right Now 0 My pain is  no pain  LOCATION OF PAIN  no pain  BOWEL Number of stools per week: 10 Oral laxative use No  Type of laxative . Enema or suppository use No  History of colostomy No  Incontinent No   BLADDER Pads In and out cath, frequency . Able to self cath  . Bladder incontinence Yes  Frequent urination Yes  Leakage with coughing Yes  Difficulty starting stream No  Incomplete bladder emptying No    Mobility walk with assistance use a walker how many minutes can you walk? 10 ability to climb steps?  yes do you drive?  no  Function disabled: date disabled 11/23/21 I need assistance with the following:  feeding, dressing, bathing, toileting, meal prep, household duties, and shopping  Neuro/Psych bladder control problems bowel control problems weakness trouble walking spasms confusion  Prior Studies Any changes since  last visit?  no  Physicians involved in your care Any changes since last visit?  no   Family History  Problem Relation Age of Onset   Cancer Brother    Coronary artery disease Brother    Social History   Socioeconomic History   Marital status: Married    Spouse name: Not on file   Number of children: Not on file   Years of education: Not on file   Highest education level: Not on file  Occupational History   Not on file  Tobacco Use   Smoking status: Former    Types: Cigarettes   Smokeless tobacco: Never  Substance and Sexual Activity   Alcohol use: Yes    Alcohol/week: 3.0 standard drinks of alcohol    Types: 3 Cans of beer per week   Drug use: Never   Sexual activity: Not on file  Other Topics Concern   Not on file  Social History Narrative   Not on file   Social Determinants of Health   Financial Resource Strain: Not on file  Food Insecurity: Not on file  Transportation Needs: Not on file  Physical Activity: Not on file  Stress: Not on file  Social Connections: Not on file   Past Surgical History:  Procedure Laterality Date   CORONARY ARTERY BYPASS GRAFT  2012   ESOPHAGOGASTRODUODENOSCOPY N/A 12/13/2021  Procedure: ESOPHAGOGASTRODUODENOSCOPY (EGD);  Surgeon: Tyler Oka, MD;  [facility] ENDOSCOPY;  Service: General;  Laterality: N/A;   IR [place] W/FLUORO  01/29/2022   PEG PLACEMENT N/A 12/13/2021   Procedure: PERCUTANEOUS ENDOSCOPIC GASTROSTOMY (PEG) PLACEMENT;  Surgeon: Tyler Oka, MD;  Location: [facility] ENDOSCOPY;  Service: General;  Laterality: N/A;   SUBOCCIPITAL CRANIECTOMY CERVICAL LAMINECTOMY N/A 11/26/2021   Procedure: SUBOCCIPITAL CRANIECTOMY;  Surgeon: Tyler Part, MD;  Location: [facility];  Service: Neurosurgery;  Laterality: N/A;   Past Medical History:  Diagnosis Date   CAD (coronary artery disease)    a. s/p CABG in 2012   Hyperlipidemia    BP 115/76   Pulse (!) 58   Ht '5\' 10"'$  (1.778 m)   Wt 199 lb  (90.3 kg)   SpO2 95%   BMI 28.55 kg/m   Opioid Risk Score:   Fall Risk Score:  `1  Depression screen [facility] 2/9     06/12/2022    1:06 PM 02/22/2022    1:07 PM  Depression screen PHQ 2/9  Decreased Interest 0 1  Down, Depressed, Hopeless 0 0  PHQ - 2 Score 0 1  Altered sleeping  0  Tired, decreased energy  1  Change in appetite  0  Feeling bad or failure about yourself   0  Trouble concentrating  0  Moving slowly or fidgety/restless  0  Suicidal thoughts  0  PHQ-9 Score  2  Difficult doing work/chores  Extremely dIfficult      Review of Systems  Musculoskeletal:  Positive for gait problem.  Neurological:  Positive for weakness.  Psychiatric/Behavioral:  Positive for confusion.   All other systems reviewed and are negative.     Objective:   Physical Exam Gen: no distress, normal appearing HEENT: oral mucosa pink and moist, NCAT Cardio: Reg rate Chest: normal effort, normal rate of breathing Abd: soft, non-distended, PEG site with mild granulation and serosang discharge Ext: no edema Psych: pleasant, normal affect Skin: intact Neuro: oriented to date -1 day, needed cueing for month and year but was able to figure out. Some STM deficits Musculoskeletal: Full ROM, No pain with AROM or PROM in the neck, trunk, or extremities. Posture appropriate         Assessment & Plan:   1. 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  Status post posterior decompression with craniectomy 11/26/2021              -CONTINUE outpt therapies -walker for balance            2.  -DVT/anticoagulation: off a/c d/t bleeding               3. Pain Management:  tylenol 4. Agitation:               - celexa   '20mg'$  qhs - seroquel--decrease to '50mg'$  qhs               5.  Prolonged ventilatory support.  Status post tracheostomy 12/26/  6.  Dysphagia: still NPO and is only on tastes of crunchy items and honey  liquids   -f/u MBS             -oob, oxygenation improved 7.  New onset atrial fibrillation.  resume eliquis  -Watchman on hold                 8.  Hypertension. Per primary.  9.  History of BPH/urinary retention.                                10.  CAD with CABG times 03/2011 in [place].  Follow-up per cardiology services. 11. Hematuria related to prostate cancer/radiation, recurrent UTI. Now with kidney stone               - radiation cystitis,  -stone extraction per urology            30 minutes of face to face patient care time were spent during this visit. All questions were encouraged and answered.  Follow up with me in 3 months.

## 2022-06-25 ENCOUNTER — Other Ambulatory Visit (HOSPITAL_COMMUNITY): Payer: Self-pay | Admitting: Interventional Radiology

## 2022-06-25 DIAGNOSIS — Z431 Encounter for attention to gastrostomy: Secondary | ICD-10-CM

## 2022-06-27 HISTORY — PX: KIDNEY STONE SURGERY: SHX686

## 2022-07-19 ENCOUNTER — Other Ambulatory Visit (HOSPITAL_COMMUNITY): Payer: Self-pay | Admitting: Interventional Radiology

## 2022-07-19 ENCOUNTER — Ambulatory Visit (HOSPITAL_COMMUNITY)
Admission: RE | Admit: 2022-07-19 | Discharge: 2022-07-19 | Disposition: A | Discharge status: Home / Self Care | Payer: Medicare Other | Source: Ambulatory Visit | Attending: Interventional Radiology | Admitting: Interventional Radiology

## 2022-07-19 DIAGNOSIS — Z431 Encounter for attention to gastrostomy: Secondary | ICD-10-CM | POA: Diagnosis not present

## 2022-07-19 HISTORY — PX: IR REPLC GASTRO/COLONIC TUBE PERCUT W/FLUORO: IMG2333

## 2022-07-19 MED ORDER — IOHEXOL 300 MG/ML  SOLN
100.0000 mL | Freq: Once | INTRAMUSCULAR | Status: AC | PRN
  Administered 2022-07-19: 8 mL

## 2022-07-19 MED ORDER — LIDOCAINE VISCOUS HCL 2 % MT SOLN
OROMUCOSAL | Status: AC
  Filled 2022-07-19: qty 15

## 2022-08-08 ENCOUNTER — Ambulatory Visit: Payer: Medicare Other | Admitting: Neurology

## 2022-08-20 ENCOUNTER — Encounter: Payer: Self-pay | Admitting: Neurology

## 2022-08-20 ENCOUNTER — Ambulatory Visit (INDEPENDENT_AMBULATORY_CARE_PROVIDER_SITE_OTHER): Payer: Medicare Other | Admitting: Neurology

## 2022-08-20 VITALS — BP 112/67 | HR 63 | Ht 70.0 in | Wt 190.6 lb

## 2022-08-20 DIAGNOSIS — I482 Chronic atrial fibrillation, unspecified: Secondary | ICD-10-CM | POA: Diagnosis not present

## 2022-08-20 DIAGNOSIS — Z7409 Other reduced mobility: Secondary | ICD-10-CM | POA: Insufficient documentation

## 2022-08-20 DIAGNOSIS — R270 Ataxia, unspecified: Secondary | ICD-10-CM

## 2022-08-20 DIAGNOSIS — Z789 Other specified health status: Secondary | ICD-10-CM | POA: Insufficient documentation

## 2022-08-20 DIAGNOSIS — I679 Cerebrovascular disease, unspecified: Secondary | ICD-10-CM | POA: Diagnosis not present

## 2022-08-20 DIAGNOSIS — Z8673 Personal history of transient ischemic attack (TIA), and cerebral infarction without residual deficits: Secondary | ICD-10-CM | POA: Diagnosis not present

## 2022-08-20 NOTE — Progress Notes (Signed)
[facility] [address] [place]. [place] [address] [phone]       OFFICE FOLLOW-UP NOTE  Mr. [name] Date of Birth:  Jul 15, 1946 Medical Record Number:  [id number]   HPI: Initial visit 5//2023: [name] is a 76 year old pleasant Caucasian male seen today for initial office follow-up visit following hospital admission for stroke in December 2022.  History is obtained from the patient and his wife as well as review of electronic medical records and personally reviewed available pertinent imaging films in PACS.  He has past medical history of coronary artery disease, status post CABG, hypertension, hyperlipidemia, osteoarthritis, nephrolithiasis, benign prostatic hypertrophy, urinary retention and prostate cancer who presented on 11/25/2021 with sudden onset of ataxia with patient leaning over against the car stating he could not move.  Patient went to primary physician's office who advised him to go to the hospital.  Upon arrival patient was found to be ataxic with his gait and nausea and vomited several times and complained of vertigo.  Patient was also complaining of abdominal pain and he had a history of kidney stones and had a cystoscopy few days ago risk-benefit of bleeding was discussed with the patient and his wife and there was some delay in decision making with TNK was administered his blood pressure was also about 200 and required IV hydralazine and starting Cleviprex drip for control.  CT angiogram of the head and neck was performed which did not show any LVO.  MRI scan of the brain showed acute infarcts in bilateral cerebellum posterior lateral portions.  Patient was monitored in the ICU subsequently he developed worsening exam and repeat MRI showed mass effect on the fourth ventricle neurosurgery was consulted and he was taken for suboccipital craniectomy on 11/27/2021.  Patient was subsequently required ventilatory support and repeat CT scan on 11/28/2021 showed  hemorrhagic transformation of the prior stroke.  He required tracheostomy and PEG tube placement.  He has had a prolonged ICU course complicated by agitation requiring sedation.  He was subsequently transferred to inpatient rehab and has made gradual improvement is currently living at home since February 24.  He is able to ambulate with a walker but requires a 1 person assist.  He still is ataxic and off balance and is difficulty controlling his right arm and leg.  He is finished home physical occupational therapy and plans to start outpatient therapy next week.  Speech therapy is continuing to follow him but he still needs by mouth and is still using his PEG tube.  He remains on Eliquis 5 mg twice daily which is had to temporarily stop due to hematuria but has been restarted and he has plans to see urology soon.  His blood pressure is well controlled today it is 120/74.  He is tolerating Lipitor well without muscle aches and pains. Update 08/20/2022 : He returns for follow-up after last visit 4 months ago.  He is accompanied by his wife.  Patient is making gradual improvement.  He is able to walk with a walker but can walk short distances with a cane under close supervision with physical therapist.  He is still doing outpatient therapies 2 days a week for speech and walking.  Swelling is also improved for the last 1 month is able to swallow well.  He still has a PEG tube but he plans to see interventional radiology and have it removed from.  He still has some incoordination in the right leg and hand is improving.  Remains on  Eliquis which is tolerating well and has not had any bleeding or bruising.  He does have a history of recurrent UTI sand does follow with a urologist. ROS:   14 system review of systems is positive for gait difficulty, dizziness, imbalance, swallowing difficulty, bruising, hematuria all other systems negative  PMH:  Past Medical History:  Diagnosis Date   CAD (coronary artery disease)     a. s/p CABG in 2012   Hyperlipidemia     Social History:  Social History   Socioeconomic History   Marital status: Married    Spouse name: Not on file   Number of children: Not on file   Years of education: Not on file   Highest education level: Not on file  Occupational History   Not on file  Tobacco Use   Smoking status: Former    Types: Cigarettes   Smokeless tobacco: Never  Substance and Sexual Activity   Alcohol use: Yes    Alcohol/week: 3.0 standard drinks of alcohol    Types: 3 Cans of beer per week   Drug use: Never   Sexual activity: Not on file  Other Topics Concern   Not on file  Social History Narrative   Not on file   Social Determinants of Health   Financial Resource Strain: Not on file  Food Insecurity: Not on file  Transportation Needs: Not on file  Physical Activity: Not on file  Stress: Not on file  Social Connections: Not on file  Intimate Partner Violence: Not on file    Medications:   Current Outpatient Medications on File Prior to Visit  Medication Sig Dispense Refill   acetaminophen (TYLENOL) 160 MG/5ML solution Place 20.3 mLs (650 mg total) into feeding tube every 4 (four) hours as needed for mild pain (or temp > 37.5 C (99.5 F)). 120 mL 0   apixaban (ELIQUIS) 5 MG TABS tablet Take 5 mg by mouth 2 (two) times daily.     atorvastatin (LIPITOR) 80 MG tablet Place 1 tablet (80 mg total) into feeding tube daily. 30 tablet 0   butalbital-acetaminophen-caffeine (FIORICET) 50-325-40 MG tablet Place 1 tablet into feeding tube every 8 (eight) hours as needed for headache. 14 tablet 0   citalopram (CELEXA) 20 MG tablet Place 1 tablet (20 mg total) into feeding tube at bedtime. 30 tablet 0   diclofenac Sodium (VOLTAREN) 1 % GEL Apply 2 g topically 4 (four) times daily. 100 g 0   doxycycline (MONODOX) 100 MG capsule Take 100 mg by mouth 2 (two) times daily.     ferrous sulfate 220 (44 Fe) MG/5ML solution Place 5 mLs (220 mg total) into feeding tube daily  with breakfast. 150 mL 3   losartan (COZAAR) 25 MG tablet Place 1 tablet (25 mg total) into feeding tube daily. 30 tablet 0   omeprazole (PRILOSEC) 20 MG capsule Open 1 capsule, suspend in water, and give contents per tube once daily. 30 capsule 0   QUEtiapine (SEROQUEL) 25 MG tablet Take 100 mg by mouth at bedtime. At 9:00 pm     traZODone (DESYREL) 150 MG tablet Place 1/2 tablet (75 mg total) into feeding tube at bedtime. 30 tablet 0   Water For Irrigation, Sterile (FREE WATER) SOLN Place 250 mLs into feeding tube every 4 (four) hours.     No current facility-administered medications on file prior to visit.    Allergies:  No Known Allergies  Physical Exam General: well developed, well nourished, elderly Caucasian male seated, in no  evident distress Head: head normocephalic and atraumatic.  Neck: supple with no carotid or supraclavicular bruits tracheostomy scar noted in the neck Cardiovascular: regular rate and rhythm, no murmurs Musculoskeletal: no deformity Skin:  no rash/petichiae .he has a PEG tube in his abdomen. Vascular:  Normal pulses all extremities Vitals:   08/20/22 1000  BP: 112/67  Pulse: 63   Neurologic Exam Mental Status: Awake and fully alert. Oriented to place and time. Recent and remote memory intact. Attention span, concentration and fund of knowledge appropriate. Mood and affect appropriate.  No dysarthria but voice is slightly hypophonic. Cranial Nerves: Fundoscopic exam reveals sharp disc margins. Pupils equal, briskly reactive to light. Extraocular movements full without nystagmus.  But with saccadic dysmetria on lateral gaze bilaterally.  Visual fields full to confrontation. Hearing intact. Facial sensation intact. Face, tongue, palate moves normally and symmetrically.  Motor: Normal bulk and tone. Normal strength in all tested extremity muscles. Sensory.: intact to touch ,pinprick .position and vibratory sensation.  Coordination: Mild right finger-to-nose and  knee to heel ataxia.. Gait and Station: Arises from chair without difficulty. Stance is broad-based. Gait demonstrates ataxia right leg and coordination and uses a walker.  Unable to perform tandem walk without difficulty.  Reflexes: 1+ and symmetric. Toes downgoing.   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  Vascular risk factors of atrial flutter, coronary artery disease, status post CABG and hypertension and hyperlipidemia     PLANI had a long d/w patient about his recent stroke,atrial fibrillation, risk for recurrent stroke/TIAs, personally independently reviewed imaging studies and stroke evaluation results and answered questions.Continue Eliquis (apixaban) daily  for secondary stroke prevention and maintain strict control of hypertension with blood pressure goal below 130/90, diabetes with hemoglobin A1c goal below 6.5% and lipids with LDL cholesterol goal below 70 mg/dL. I also advised the patient to eat a healthy diet with plenty of whole grains, cereals, fruits and vegetables, exercise regularly and maintain ideal body weight continue ongoing physical and speech therapy.  Follow-up with interventional radiology for discontinuing PEG tube following quite well now.  He was encouraged to use his walker at all times and we discussed fall prevention precautions.  Followup in the future with my nurse practitioner in 6 months or call earlier if necessary.Greater than 50% of time during this 35 minute  visit was spent on counseling,explanation of diagnosis cerebellar strokes, craniectomy, gait ataxia, planning of further management, discussion with patient  and family and coordination of care Antony Contras, MD Note: This document was prepared with digital dictation and possible smart phrase technology. Any transcriptional errors that result from this process are unintentional

## 2022-08-20 NOTE — Patient Instructions (Signed)
I had a long d/w patient about his recent stroke,atrial fibrillation, risk for recurrent stroke/TIAs, personally independently reviewed imaging studies and stroke evaluation results and answered questions.Continue Eliquis (apixaban) daily  for secondary stroke prevention and maintain strict control of hypertension with blood pressure goal below 130/90, diabetes with hemoglobin A1c goal below 6.5% and lipids with LDL cholesterol goal below 70 mg/dL. I also advised the patient to eat a healthy diet with plenty of whole grains, cereals, fruits and vegetables, exercise regularly and maintain ideal body weight continue ongoing physical and speech therapy.  Follow-up with interventional radiology for discontinuing PEG tube following quite well now.  He was encouraged to use his walker at all times and we discussed fall prevention precautions.  Followup in the future with my nurse practitioner in 6 months or call earlier if necessary.  Fall Prevention in the Home, Adult Falls can cause injuries and can happen to people of all ages. There are many things you can do to make your home safe and to help prevent falls. Ask for help when making these changes. What actions can I take to prevent falls? General Instructions Use good lighting in all rooms. Replace any light bulbs that burn out. Turn on the lights in dark areas. Use night-lights. Keep items that you use often in easy-to-reach places. Lower the shelves around your home if needed. Set up your furniture so you have a clear path. Avoid moving your furniture around. Do not have throw rugs or other things on the floor that can make you trip. Avoid walking on wet floors. If any of your floors are uneven, fix them. Add color or contrast paint or tape to clearly mark and help you see: Grab bars or handrails. First and last steps of staircases. Where the edge of each step is. If you use a stepladder: Make sure that it is fully opened. Do not climb a closed  stepladder. Make sure the sides of the stepladder are locked in place. Ask someone to hold the stepladder while you use it. Know where your pets are when moving through your home. What can I do in the bathroom?     Keep the floor dry. Clean up any water on the floor right away. Remove soap buildup in the tub or shower. Use nonskid mats or decals on the floor of the tub or shower. Attach bath mats securely with double-sided, nonslip rug tape. If you need to sit down in the shower, use a plastic, nonslip stool. Install grab bars by the toilet and in the tub and shower. Do not use towel bars as grab bars. What can I do in the bedroom? Make sure that you have a light by your bed that is easy to reach. Do not use any sheets or blankets for your bed that hang to the floor. Have a firm chair with side arms that you can use for support when you get dressed. What can I do in the kitchen? Clean up any spills right away. If you need to reach something above you, use a step stool with a grab bar. Keep electrical cords out of the way. Do not use floor polish or wax that makes floors slippery. What can I do with my stairs? Do not leave any items on the stairs. Make sure that you have a light switch at the top and the bottom of the stairs. Make sure that there are handrails on both sides of the stairs. Fix handrails that are broken or loose.  Install nonslip stair treads on all your stairs. Avoid having throw rugs at the top or bottom of the stairs. Choose a carpet that does not hide the edge of the steps on the stairs. Check carpeting to make sure that it is firmly attached to the stairs. Fix carpet that is loose or worn. What can I do on the outside of my home? Use bright outdoor lighting. Fix the edges of walkways and driveways and fix any cracks. Remove anything that might make you trip as you walk through a door, such as a raised step or threshold. Trim any bushes or trees on paths to your  home. Check to see if handrails are loose or broken and that both sides of all steps have handrails. Install guardrails along the edges of any raised decks and porches. Clear paths of anything that can make you trip, such as tools or rocks. Have leaves, snow, or ice cleared regularly. Use sand or salt on paths during winter. Clean up any spills in your garage right away. This includes grease or oil spills. What other actions can I take? Wear shoes that: Have a low heel. Do not wear high heels. Have rubber bottoms. Feel good on your feet and fit well. Are closed at the toe. Do not wear open-toe sandals. Use tools that help you move around if needed. These include: Canes. Walkers. Scooters. Crutches. Review your medicines with your doctor. Some medicines can make you feel dizzy. This can increase your chance of falling. Ask your doctor what else you can do to help prevent falls. Where to find more information Centers for Disease Control and Prevention, STEADI: http://www.wolf.info/ National Institute on Aging: http://kim-miller.com/ Contact a doctor if: You are afraid of falling at home. You feel weak, drowsy, or dizzy at home. You fall at home. Summary There are many simple things that you can do to make your home safe and to help prevent falls. Ways to make your home safe include removing things that can make you trip and installing grab bars in the bathroom. Ask for help when making these changes in your home. This information is not intended to replace advice given to you by your health care provider. Make sure you discuss any questions you have with your health care provider. Document Revised: 09/03/2021 Document Reviewed: 07/05/2020 Elsevier Patient Education  [place].

## 2022-08-27 ENCOUNTER — Other Ambulatory Visit (HOSPITAL_COMMUNITY): Payer: Self-pay | Admitting: Neurology

## 2022-08-27 DIAGNOSIS — I639 Cerebral infarction, unspecified: Secondary | ICD-10-CM

## 2022-08-29 ENCOUNTER — Ambulatory Visit (HOSPITAL_COMMUNITY)
Admission: RE | Admit: 2022-08-29 | Discharge: 2022-08-29 | Disposition: A | Discharge status: Home / Self Care | Payer: Medicare Other | Source: Ambulatory Visit | Attending: Neurology | Admitting: Neurology

## 2022-08-29 DIAGNOSIS — I639 Cerebral infarction, unspecified: Secondary | ICD-10-CM | POA: Insufficient documentation

## 2022-08-29 DIAGNOSIS — Z431 Encounter for attention to gastrostomy: Secondary | ICD-10-CM | POA: Diagnosis present

## 2022-08-29 HISTORY — PX: IR GASTROSTOMY TUBE REMOVAL: IMG5492

## 2022-09-11 ENCOUNTER — Encounter: Payer: Self-pay | Admitting: Physical Medicine & Rehabilitation

## 2022-09-11 ENCOUNTER — Encounter: Payer: Medicare Other | Attending: Physical Medicine & Rehabilitation | Admitting: Physical Medicine & Rehabilitation

## 2022-09-11 VITALS — BP 139/86 | HR 54 | Ht 70.0 in | Wt 197.0 lb

## 2022-09-11 DIAGNOSIS — G479 Sleep disorder, unspecified: Secondary | ICD-10-CM | POA: Diagnosis present

## 2022-09-11 DIAGNOSIS — I639 Cerebral infarction, unspecified: Secondary | ICD-10-CM | POA: Diagnosis present

## 2022-09-11 NOTE — Progress Notes (Signed)
Subjective:    Patient ID: [name], male    DOB: 07-14-1946, 76 y.o.   MRN: [id number]  HPI  [name] is here in follow up of his cerebellar hemorrhage. He has had his PEG removed. His kidney stone was removed also. He is using his RW for gait at home. He's working on ambulation with cane.    He had one fall at home when he was reaching down for slippers. He lost his balance and fell with walker in hand.   He's sleeping well. He remains on '25mg'$  seroquel, '75mg'$  trazodone and celexa at bedtime. Bladder is emptying well without hematuria. He had one UTI over the summer. He says his mood has been positive. His wife is concerned that he has been less enthusiastic about life. [name]. He is doing some house chores, occasionally makes a meal, but he sits around more than he did before.   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 He would like to go back to that eventually. He also wants to return to driving.      Pain Inventory Average Pain 0 Pain Right Now 0 My pain is  no pain  LOCATION OF PAIN  no pain  BOWEL Number of stools per week: 9 Oral laxative use No   BLADDER Pads In and out cath, frequency no need Able to self cath Yes  Bladder incontinence Yes     Mobility use a walker ability to climb steps?  yes do you drive?  no Do you have any goals in this area?  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  yes  Neuro/Psych bladder control problems tremor tingling trouble walking anxiety  Prior Studies Any changes since last visit?  yes x-rays, Dr. Rogelia Rohrer  Physicians involved in your care Any changes since last visit?  no   Family History  Problem Relation Age of Onset   Cancer Brother    Coronary artery disease Brother    Social History   Socioeconomic  History   Marital status: Married    Spouse name: Not on file   Number of children: Not on file   Years of education: Not on file   Highest education level: Not on file  Occupational History   Not on file  Tobacco Use   Smoking status: Former    Types: Cigarettes   Smokeless tobacco: Never  Substance and Sexual Activity   Alcohol use: Yes    Alcohol/week: 3.0 standard drinks of alcohol    Types: 3 Cans of beer per week   Drug use: Never   Sexual activity: Not on file  Other Topics Concern   Not on file  Social History Narrative   Not on file   Social Determinants of Health   Financial Resource Strain: Not on file  Food Insecurity: Not on file  Transportation Needs: Not on file  Physical Activity: Not on file  Stress: Not on file  Social Connections: Not on file   Past Surgical History:  Procedure Laterality Date   CORONARY ARTERY BYPASS GRAFT  2012   ESOPHAGOGASTRODUODENOSCOPY N/A 12/13/2021   Procedure: ESOPHAGOGASTRODUODENOSCOPY (EGD);  Surgeon: Jesusita Oka, MD;  Location: [facility];  Service: General;  Laterality: N/A;   IR GASTROSTOMY TUBE REMOVAL  08/29/2022   IR [place]  PERCUT W/FLUORO  01/29/2022   IR REPLC GASTRO/COLONIC TUBE PERCUT W/FLUORO  07/19/2022   PEG PLACEMENT N/A 12/13/2021   Procedure: PERCUTANEOUS ENDOSCOPIC GASTROSTOMY (PEG) PLACEMENT;  Surgeon: Jesusita Oka, MD;  Location: [facility];  Service: General;  Laterality: N/A;   SUBOCCIPITAL CRANIECTOMY CERVICAL LAMINECTOMY N/A 11/26/2021   Procedure: SUBOCCIPITAL CRANIECTOMY;  Surgeon: Judith Part, MD;  Location: [facility];  Service: Neurosurgery;  Laterality: N/A;   Past Medical History:  Diagnosis Date   CAD (coronary artery disease)    a. s/p CABG in 2012   Hyperlipidemia    There were no vitals taken for this visit.  Opioid Risk Score:   Fall Risk Score:  `1  Depression screen [facility] 2/9     06/12/2022    1:06 PM 02/22/2022    1:07 PM  Depression screen PHQ  2/9  Decreased Interest 0 1  Down, Depressed, Hopeless 0 0  PHQ - 2 Score 0 1  Altered sleeping  0  Tired, decreased energy  1  Change in appetite  0  Feeling bad or failure about yourself   0  Trouble concentrating  0  Moving slowly or fidgety/restless  0  Suicidal thoughts  0  PHQ-9 Score  2  Difficult doing work/chores  Extremely dIfficult    Review of Systems  Genitourinary:        Incontinence  Musculoskeletal:  Positive for gait problem.  Neurological:  Positive for tremors.       Spasms  Psychiatric/Behavioral:         Anxiety  All other systems reviewed and are negative.      Objective:   Physical Exam  General: No acute distress HEENT: NCAT, EOMI, oral membranes moist Cards: reg rate  Chest: normal effort Abdomen: Soft, NT, ND, peg out Skin: dry, intact Extremities: no edema Psych: pleasant and appropriate  Skin: intact Neuro: oriented x 3. Alert. Improved insight, awareness, memory and engagement.  Musculoskeletal: Full ROM, No pain with AROM or PROM in the neck, trunk, or extremities. Posture appropriate in sitting. Tends to have narrow base of support.tends to use steppage pattern with right leg. No limb ataxia noted.            Assessment & Plan:    1. 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  Status post posterior decompression with craniectomy 11/26/2021            -CONTINUE outpt therapies -rolling walker for balance. I would prefer he stay with this in the short term -gave partial return to driving instructions to pt/wife. No driving alone at this point.            2.  -DVT/anticoagulation: eliquis               3. Pain Management:  tylenol 4. Agitation/Mood/sleep:               - celexa   '20mg'$  qhs - seroquel-change to prn.             -melatonin '5mg'$      -in 2 weeks, reduce trazodone to '50mg'$  and hold there 6.  Dysphagia: on regular diet and meds.  7.  New  onset atrial fibrillation.  resume eliquis            -Watchman on hold 8.  Hypertension. Per primary.  9.  History of BPH/urinary retention.  10.  CAD with CABG times 03/2011 in [place].  Follow-up per cardiology services. 11. Hematuria related to prostate cancer/radiation, recurrent UTI. Now with kidney stone               - radiation cystitis,            -s/p stone extraction per urology   30 minutes of face to face patient care time were spent during this visit. All questions were encouraged and answered.  Follow up with me in 2 MOS .

## 2022-09-11 NOTE — Patient Instructions (Addendum)
STOP SEROQUEL BEGIN MELATONIN '5MG'$  AT BEDTIME SEE HOW YOU DO WITH THIS FOR 2 WEEKS.   IF NO PROBLEMS, THEN BACK TRAZODONE TO '50MG'$  AT BEDTIME.    RETURN TO DRIVING PLAN:  WITH THE SUPERVISION OF A LICENSED DRIVER, PLEASE DRIVE IN AN EMPTY PARKING LOT FOR AT LEAST 2-3 TRIALS TO TEST REACTION TIME, VISION, USE OF EQUIPMENT IN CAR, ETC.  IF SUCCESSFUL WITH THE PARKING LOT DRIVING, PROCEED TO SUPERVISED DRIVING TRIALS IN YOUR NEIGHBORHOOD STREETS AT LOW TRAFFIC TIMES TO TEST OBSERVATION TO TRAFFIC SIGNALS, REACTION TIME, ETC. PLEASE ATTEMPT AT LEAST 2-3 TRIALS IN YOUR NEIGHBORHOOD.  IF NEIGHBORHOOD DRIVING IS SUCCESSFUL, YOU MAY PROCEED TO DRIVING IN BUSIER AREAS IN YOUR COMMUNITY WITH SUPERVISION OF A LICENSED DRIVER DURING THE DAY TIME, LOW TRAFFIC TIME.

## 2022-09-15 HISTORY — PX: SKIN SURGERY: SHX2413

## 2022-12-18 ENCOUNTER — Encounter: Payer: Self-pay | Admitting: Physical Medicine & Rehabilitation

## 2022-12-18 ENCOUNTER — Encounter: Payer: Medicare Other | Attending: Physical Medicine & Rehabilitation | Admitting: Physical Medicine & Rehabilitation

## 2022-12-18 VITALS — BP 120/75 | HR 50 | Ht 70.0 in | Wt 195.0 lb

## 2022-12-18 DIAGNOSIS — R319 Hematuria, unspecified: Secondary | ICD-10-CM | POA: Diagnosis not present

## 2022-12-18 DIAGNOSIS — R270 Ataxia, unspecified: Secondary | ICD-10-CM | POA: Diagnosis not present

## 2022-12-18 DIAGNOSIS — I639 Cerebral infarction, unspecified: Secondary | ICD-10-CM

## 2022-12-18 MED ORDER — TRAZODONE HCL 50 MG PO TABS: 50.0000 mg | ORAL_TABLET | Freq: Every day | ORAL | 4 refills | Status: DC

## 2022-12-18 NOTE — Progress Notes (Deleted)
   Subjective:    Patient ID: [name], male    DOB: 1946-04-04, 76 y.o.   MRN: [id number]  HPI   .cpr Review of Systems     Objective:   Physical Exam        Assessment & Plan:

## 2022-12-18 NOTE — Progress Notes (Signed)
Subjective:    Patient ID: [name], male    DOB: [birth year], 77 y.o.   MRN: [id number]  HPI  [name] is here in follow up of his cerebellar hemorrhage. He is now walking primarily with his forearm cane. He uses his walker at night when he needs to get up for the bathroom.  He voids every 2 hours. His urine has mostly been clear but will have occasional blood clots.  He time voids during the day to reduce incontinence.   He states that his mood has been positive. He reads and is on his phone. He is generally very sedentary however. He says that he would like to get outside more. He has friends who will take him out on outings. He recently went to a [employer] basketball game which he really enjoyed. Wife says he has helped folding clothes and wonders if he can do more around the house as well.   He never went out to drive as we discussed at last visit. It was only for a trial run in a parking lot. Wife says he never asked to go!  He is sleeping well. He's off all meds at night except celexa and '75mg'$  trazodone.      Pain Inventory Average Pain 0 Pain Right Now 0 My pain is  no pain  LOCATION OF PAIN  no pain  BOWEL Number of stools per week: 14 Oral laxative use Yes  Type of laxative [place] when needed  BLADDER Normal   Mobility walk without assistance use a cane use a walker how many minutes can you walk? 20 ability to climb steps?  yes do you drive?  no Do you have any goals in this area?  yes  Function retired I need assistance with the following:  meal prep Do you have any goals in this area?  yes  Neuro/Psych trouble walking  Prior Studies Any changes since last visit?  no  Physicians involved in your care Any changes since last visit?  yes   Family History  Problem Relation Age of Onset   Cancer Brother    Coronary artery disease Brother    Social History   Socioeconomic History   Marital status: Married    Spouse name: Not on file   Number of  children: Not on file   Years of education: Not on file   Highest education level: Not on file  Occupational History   Not on file  Tobacco Use   Smoking status: Former    Types: Cigarettes   Smokeless tobacco: Never  Vaping Use   Vaping Use: Never used  Substance and Sexual Activity   Alcohol use: Yes    Alcohol/week: 3.0 standard drinks of alcohol    Types: 3 Cans of beer per week   Drug use: Never   Sexual activity: Not on file  Other Topics Concern   Not on file  Social History Narrative   Not on file   Social Determinants of Health   Financial Resource Strain: Not on file  Food Insecurity: Not on file  Transportation Needs: Not on file  Physical Activity: Not on file  Stress: Not on file  Social Connections: Not on file   Past Surgical History:  Procedure Laterality Date   CORONARY ARTERY BYPASS GRAFT  2012   ESOPHAGOGASTRODUODENOSCOPY N/A 12/13/2021   Procedure: ESOPHAGOGASTRODUODENOSCOPY (EGD);  Surgeon: Jesusita Oka, MD;  Location: [facility]COPY;  Service: General;  Laterality: N/A;   IR GASTROSTOMY TUBE REMOVAL  08/29/2022   IR REPLC GASTRO/COLONIC TUBE PERCUT W/FLUORO  01/29/2022   IR REPLC GASTRO/COLONIC TUBE PERCUT W/FLUORO  07/19/2022   KIDNEY STONE SURGERY  06/27/2022   bladder  at [facility]   PEG PLACEMENT N/A 12/13/2021   Procedure: PERCUTANEOUS ENDOSCOPIC GASTROSTOMY (PEG) PLACEMENT;  Surgeon: Jesusita Oka, MD;  Location: [place];  Service: General;  Laterality: N/A;   SUBOCCIPITAL CRANIECTOMY CERVICAL LAMINECTOMY N/A 11/26/2021   Procedure: SUBOCCIPITAL CRANIECTOMY;  Surgeon: Judith Part, MD;  Location: [place];  Service: Neurosurgery;  Laterality: N/A;   Past Medical History:  Diagnosis Date   CAD (coronary artery disease)    a. s/p CABG in 2012   Hyperlipidemia    Ht '5\' 10"'$  (1.778 m)   Wt 195 lb (88.5 kg)   BMI 27.98 kg/m   Opioid Risk Score:   Fall Risk Score:  `1  Depression screen [facility] 2/9     09/11/2022    2:43 PM  06/12/2022    1:06 PM 02/22/2022    1:07 PM  Depression screen PHQ 2/9  Decreased Interest 0 0 1  Down, Depressed, Hopeless 0 0 0  PHQ - 2 Score 0 0 1  Altered sleeping   0  Tired, decreased energy   1  Change in appetite   0  Feeling bad or failure about yourself    0  Trouble concentrating   0  Moving slowly or fidgety/restless   0  Suicidal thoughts   0  PHQ-9 Score   2  Difficult doing work/chores   Extremely dIfficult    Review of Systems  Musculoskeletal:  Positive for gait problem.       Balance and coordination of right foot, hand & arm  All other systems reviewed and are negative.      Objective:   Physical Exam  General: No acute distress HEENT: NCAT, EOMI, oral membranes moist Cards: reg rate  Chest: normal effort Abdomen: Soft, NT, ND Skin: dry, intact Extremities: no edema Psych: pleasant and appropriate   Skin: intact Neuro: oriented x 3. Alert. Much impeocws insight, awareness, memory and engagement.  Musculoskeletal: Full ROM, No pain with AROM or PROM in the neck, trunk, or extremities. Posture appropriate.improved weight shift on right. Advances right leg more fluidly. Still a little uneasy with weight shift but does well with cane. Ataxia RUE and RLE but improved. Occasional processing delays and word finding deficits.            Assessment & Plan:    1. 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  Status post posterior decompression with craniectomy 11/26/2021            -CONTINUE outpt therapies -rolling walker for balance at night. Cane for day time use.  -LIMITED Return to driving instructions have been provided to pt/wife for practice on their own property. No driving alone at this point.  Would need formal driving eval by OT 2.  -DVT/anticoagulation: eliquis               3. Pain Management:  tylenol 4. Agitation/Mood/sleep:              - celexa   '20mg'$   qhs--should continue for now - reduce trazodone to '50mg'$ , can reduce to '25mg'$  and off 6.  Dysphagia: on regular diet and meds.  7.  New onset atrial fibrillation.  resume eliquis            -  Watchman on hold 8.  Hypertension. Per primary.  9.  History of BPH/urinary retention.                               -rimws coisa 10.  CAD with CABG times 03/2011 in [place].  Follow-up per cardiology services. 11. Hematuria related to prostate cancer/radiation, recurrent UTI. Now with kidney stone               - radiation cystitis,            -s/p stone extraction per urology     30 minutes of face to face patient care time were spent during this visit. All questions were encouraged and answered.  Follow up with me in 4 MOS .

## 2022-12-18 NOTE — Patient Instructions (Addendum)
ALWAYS FEEL FREE TO CALL OUR OFFICE WITH ANY PROBLEMS OR QUESTIONS ([phone])  **PLEASE NOTE** ALL MEDICATION REFILL REQUESTS (INCLUDING CONTROLLED SUBSTANCES) NEED TO BE MADE AT LEAST 7 DAYS PRIOR TO REFILL BEING DUE. ANY REFILL REQUESTS INSIDE THAT TIME FRAME MAY RESULT IN DELAYS IN RECEIVING YOUR PRESCRIPTION.     [facility], museum/gallery in [place], [place]   Address: [address] [place]el[place] B[place], [place] [address] Phone: [phone]     REDUCE TRAZODONE TO (FULL TAB) '50MG'$  AT BEDTIME FOR ONE WEEK THEN 1/2 TAB ('25MG'$ ) FOR ONE WEEK THEN OFF

## 2023-02-17 NOTE — Progress Notes (Unsigned)
[facility] [address] [place]. [place] [address] [phone]       OFFICE FOLLOW-UP NOTE  [name] Date of Birth:  [birth year] Medical Record Number:  [id number]   Primary neurologist: Dr. Leonie Pham Reason for visit: Stroke follow-up   Chief Complaint  Patient presents with   Follow-up    RM 3 with spouse [name] Pt is well and stable, no new concerns       HPI:  Update 02/18/2023 JM: Patient returns for 6-monthstroke follow-up accompanied by his wife.  Overall stable without new stroke/TIA symptoms.  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  Completed therapies back in January and plans on restarting mid summer. Continues to do HEP.  Ambulates with cane, will use RW at night, did slip out of bed about 1 week ago, denies any injury or hitting of head. No other falls since that time.  Did have PEG tube removed on 08/2022 and currently maintaining a regular diet without difficulty.  Continues to follow with PMR.  Compliant on Eliquis and atorvastatin Blood pressure well-controlled Routinely follows with PCP      History provided for reference purposes only Update 08/20/2022 Dr. SLeonie Pham He returns for follow-up after last visit 4 months ago.  He is accompanied by his wife.  Patient is making gradual improvement.  He is able to walk with a walker but can walk short distances with a cane under close supervision with physical therapist.  He is still doing outpatient therapies 2 days a week for speech and walking.  Swelling is also improved for the last 1 month is able to swallow well.  He still has a PEG tube but he plans to see interventional radiology and have it removed from.  He still has some incoordination in the right leg and hand is improving.  Remains on Eliquis which is tolerating well and has not had any bleeding or bruising.  He does have a history of recurrent UTI sand does follow with a  urologist.  Initial visit 5//2023 Dr. SLeonie Pham [name] a 77year old pleasant Caucasian male seen today for initial office follow-up visit following hospital admission for stroke in December 2022.  History is obtained from the patient and his wife as well as review of electronic medical records and personally reviewed available pertinent imaging films in PACS.  He has past medical history of coronary artery disease, status post CABG, hypertension, hyperlipidemia, osteoarthritis, nephrolithiasis, benign prostatic hypertrophy, urinary retention and prostate cancer who presented on 11/25/2021 with sudden onset of ataxia with patient leaning over against the car stating he could not move.  Patient went to primary physician's office who advised him to go to the hospital.  Upon arrival patient was found to be ataxic with his gait and nausea and vomited several times and complained of vertigo.  Patient was also complaining of abdominal pain and he had a history of kidney stones and had a cystoscopy few days ago risk-benefit of bleeding was discussed with the patient and his wife and there was some delay in decision making with TNK was administered his blood pressure was also about 200 and required IV hydralazine and starting Cleviprex drip for control.  CT angiogram of the head and neck was performed which did not show any LVO.  MRI scan of the brain showed acute infarcts in bilateral cerebellum posterior lateral portions.  Patient was monitored in the ICU subsequently he developed worsening exam and repeat MRI showed  mass effect on the fourth ventricle neurosurgery was consulted and he was taken for suboccipital craniectomy on 11/27/2021.  Patient was subsequently required ventilatory support and repeat CT scan on 11/28/2021 showed hemorrhagic transformation of the prior stroke.  He required tracheostomy and PEG tube placement.  He has had a prolonged ICU course complicated by agitation requiring sedation.  He was  subsequently transferred to inpatient rehab and has made gradual improvement is currently living at home since February 24.  He is able to ambulate with a walker but requires a 1 person assist.  He still is ataxic and off balance and is difficulty controlling his right arm and leg.  He is finished home physical occupational therapy and plans to start outpatient therapy next week.  Speech therapy is continuing to follow him but he still needs by mouth and is still using his PEG tube.  He remains on Eliquis 5 mg twice daily which is had to temporarily stop due to hematuria but has been restarted and he has plans to see urology soon.  His blood pressure is well controlled today it is 120/74.  He is tolerating Lipitor well without muscle aches and pains.     ROS:   14 system review of systems is positive for those listed in HPI and all other systems negative  PMH:  Past Medical History:  Diagnosis Date   CAD (coronary artery disease)    a. s/p CABG in 2012   Hyperlipidemia     Social History:  Social History   Socioeconomic History   Marital status: Married    Spouse name: Not on file   Number of children: Not on file   Years of education: Not on file   Highest education level: Not on file  Occupational History   Not on file  Tobacco Use   Smoking status: Former    Types: Cigarettes   Smokeless tobacco: Never  Vaping Use   Vaping Use: Never used  Substance and Sexual Activity   Alcohol use: Yes    Alcohol/week: 3.0 standard drinks of alcohol    Types: 3 Cans of beer per week   Drug use: Never   Sexual activity: Not on file  Other Topics Concern   Not on file  Social History Narrative   Not on file   Social Determinants of Health   Financial Resource Strain: Not on file  Food Insecurity: Not on file  Transportation Needs: Not on file  Physical Activity: Not on file  Stress: Not on file  Social Connections: Not on file  Intimate Partner Violence: Not on file     Medications:   Current Outpatient Medications on File Prior to Visit  Medication Sig Dispense Refill   apixaban (ELIQUIS) 5 MG TABS tablet Take 1 tablet by mouth 2 (two) times daily.     atorvastatin (LIPITOR) 80 MG tablet Place 1 tablet (80 mg total) into feeding tube daily. 30 tablet 0   butalbital-acetaminophen-caffeine (FIORICET) 50-325-40 MG tablet Take by mouth.     citalopram (CELEXA) 20 MG tablet Take 1 tablet by mouth daily.     losartan (COZAAR) 25 MG tablet Place 1 tablet (25 mg total) into feeding tube daily. 30 tablet 0   omeprazole (PRILOSEC) 20 MG capsule Open 1 capsule, suspend in water, and give contents per tube once daily. 30 capsule 0   oxyCODONE (OXY IR/ROXICODONE) 5 MG immediate release tablet Take by mouth.     oxyCODONE (OXY IR/ROXICODONE) 5 MG immediate release tablet  Take by mouth.     senna (SENOKOT) 8.6 MG TABS tablet Take 1 tablet by mouth.     No current facility-administered medications on file prior to visit.    Allergies:  No Known Allergies  Physical Exam Today's Vitals   02/18/23 1507 02/18/23 1530  BP: (!) 148/70 138/78  Pulse: (!) 55   Weight: 186 lb (84.4 kg)   Height: '5\' 10"'$  (1.778 m)    Body mass index is 26.69 kg/m.   General: well developed, well nourished, very pleasant elderly Caucasian male seated, in no evident distress Head: head normocephalic and atraumatic.  Neck: supple with no carotid or supraclavicular bruits tracheostomy scar noted in the neck Cardiovascular: regular rate and rhythm, no murmurs Musculoskeletal: no deformity Skin:  no rash/petichiae Vascular:  Normal pulses all extremities  Neurologic Exam Mental Status: Awake and fully alert. Oriented to place and time. Recent and remote memory intact. Attention span, concentration and fund of knowledge appropriate. Mood and affect appropriate.  No dysarthria but voice is slightly hypophonic. Cranial Nerves: Pupils equal, briskly reactive to light. Extraocular  movements full without nystagmus.  But with saccadic dysmetria on lateral gaze bilaterally.  Visual fields full to confrontation. Hearing intact. Facial sensation intact. Face, tongue, palate moves normally and symmetrically.  Motor: Normal bulk and tone. Normal strength in all tested extremity muscles. Sensory.: intact to touch ,pinprick .position and vibratory sensation.  Coordination: Mild right finger-to-nose and knee to heel ataxia. Normal left side Gait and Station: Arises from chair without difficulty. Stance is broad-based. Gait demonstrates ataxia right leg and coordination and use of cane. Tandem walk and heel toe not attempted  Reflexes: 1+ and symmetric. Toes downgoing.       ASSESSMENT: 77 year old Caucasian male with bilateral cerebellar infarcts with hemorrhagic transformation and brainstem compression status post suboccipital craniectomy for decompression secondary to newly diagnosed atrial flutter s/p tracheostomy and PEG tube s/p removal 08/2022 who is making gradual recovery but still has residual gait and right hemibody ataxia.  Vascular risk factors of atrial flutter, coronary artery disease, status post CABG and hypertension and hyperlipidemia    -continue doing HEP at home, restart therapies as planned mid summer, continue to follow with PMR -Continue Eliquis (apixaban) daily and atorvastatin 80 mg daily for secondary stroke prevention -medications to be prescribed by PCP -Continue close PCP follow-up for aggressive stroke risk factor management including including BP goal<130/90, and HLD with LDL goal<70   Overall stable from stroke standpoint without further recommendations and risk factors managed by PCP.  Can follow-up with [place] on an as-needed basis.    I spent 31 minutes of face-to-face and non-face-to-face time with patient and wife.  This included previsit chart review, lab review, study review, order entry, electronic health record documentation, patient and wife  education and discussion regarding the above and answered all other questions to patient and wife's satisfaction  Frann Rider, AGNP-BC  [facility] [address] [place], [place] [address]  Phone [phone] Fax [phone] Note: This document was prepared with digital dictation and possible smart phrase technology. Any transcriptional errors that result from this process are unintentional.

## 2023-02-18 ENCOUNTER — Ambulatory Visit (INDEPENDENT_AMBULATORY_CARE_PROVIDER_SITE_OTHER): Payer: Medicare Other | Admitting: Adult Health

## 2023-02-18 ENCOUNTER — Encounter: Payer: Self-pay | Admitting: Adult Health

## 2023-02-18 VITALS — BP 138/78 | HR 55 | Ht 70.0 in | Wt 186.0 lb

## 2023-02-18 DIAGNOSIS — R270 Ataxia, unspecified: Secondary | ICD-10-CM

## 2023-02-18 DIAGNOSIS — I639 Cerebral infarction, unspecified: Secondary | ICD-10-CM

## 2023-02-18 DIAGNOSIS — I679 Cerebrovascular disease, unspecified: Secondary | ICD-10-CM

## 2023-02-18 NOTE — Patient Instructions (Signed)
Restart therapy once you are ready, continue to do exercises at home as advised   Continue Eliquis (apixaban) daily  and atorvastatin  for secondary stroke prevention  Continue to follow up with PCP regarding blood pressure and cholesterol management  Maintain strict control of hypertension with blood pressure goal below 130/90, diabetes with hemoglobin A1c goal below 7.0 % and cholesterol with LDL cholesterol (bad cholesterol) goal below 70 mg/dL.   Signs of a Stroke? Follow the BEFAST method:  Balance Watch for a sudden loss of balance, trouble with coordination or vertigo Eyes Is there a sudden loss of vision in one or both eyes? Or double vision?  Face: Ask the person to smile. Does one side of the face droop or is it numb?  Arms: Ask the person to raise both arms. Does one arm drift downward? Is there weakness or numbness of a leg? Speech: Ask the person to repeat a simple phrase. Does the speech sound slurred/strange? Is the person confused ? Time: If you observe any of these signs, call [phone].       Thank you for coming to see [place] at [facility]. I hope we have been able to provide you high quality care today.  You may receive a patient satisfaction survey over the next few weeks. We would appreciate your feedback and comments so that we may continue to improve ourselves and the health of our patients.

## 2023-04-16 ENCOUNTER — Encounter: Payer: Self-pay | Admitting: Physical Medicine & Rehabilitation

## 2023-04-16 ENCOUNTER — Encounter: Payer: Medicare Other | Attending: Physical Medicine & Rehabilitation | Admitting: Physical Medicine & Rehabilitation

## 2023-04-16 VITALS — BP 165/84 | HR 56 | Ht 70.0 in | Wt 181.0 lb

## 2023-04-16 DIAGNOSIS — I639 Cerebral infarction, unspecified: Secondary | ICD-10-CM | POA: Diagnosis present

## 2023-04-16 NOTE — Patient Instructions (Addendum)
ALWAYS FEEL FREE TO CALL OUR OFFICE WITH ANY PROBLEMS OR QUESTIONS [phone])  **PLEASE NOTE** ALL MEDICATION REFILL REQUESTS (INCLUDING CONTROLLED SUBSTANCES) NEED TO BE MADE AT LEAST 7 DAYS PRIOR TO REFILL BEING DUE. ANY REFILL REQUESTS INSIDE THAT TIME FRAME MAY RESULT IN DELAYS IN RECEIVING YOUR PRESCRIPTION.    TRY TO DRINK AT LEAST ONE PROTEIN/SUPPLEMENT SHAKE PER DAY.   EAT FOOD WITH GRAVY, EAT SOFTER FOOD, AVOID BREADS AND MEATS.   MUCINEX DM ONE TABLET TWICE DAILY

## 2023-04-16 NOTE — Progress Notes (Signed)
Subjective:    Patient ID: [name], male    DOB: 1946/10/11, 77 y.o.   MRN: [id number]  HPI  [name] is here in follow up of his cerebellar hemorrhage. I last saw him in January. He says he took the truck out a few times and things went fairly well. He felt fairly steady from a coordination standpoint. It's been a couple months since the last attempt.  He is using a loftstrand crutch for balance in his left arm. He has finished outpt therapies.   He has had a chronic cough. It seems to be worse after he's been eating and drinking. It seems worse when he's eating dry foods. He denies fevers. He's had a cxr which was unremarkable. There is a MBS planned by primary. He has been on abx.   Wife is concerned about ongoing weight loss despite the fact that he's eating well. He's note exercising much. He denies losing appetite. He does admit to not moving around a whole lot.    Pain Inventory Average Pain 0 Pain Right Now 0 My pain is  no pain  LOCATION OF PAIN  no pain  BOWEL Number of stools per week: 21 Oral laxative use  . Type of laxative . Enema or suppository use No  History of colostomy No  Incontinent Yes   BLADDER Pads In and out cath, frequency . Able to self cath  . Bladder incontinence Yes  Frequent urination No  Leakage with coughing No  Difficulty starting stream No  Incomplete bladder emptying No    Mobility use a cane  Function not employed: date last employed .  Neuro/Psych bladder control problems bowel control problems trouble walking  Prior Studies x-rays  Physicians involved in your care Primary care Corliss Parish, MD   Family History  Problem Relation Age of Onset   Cancer Brother    Coronary artery disease Brother    Social History   Socioeconomic History   Marital status: Married    Spouse name: Not on file   Number of children: Not on file   Years of education: Not on file   Highest education level: Not on file   Occupational History   Not on file  Tobacco Use   Smoking status: Former    Types: Cigarettes   Smokeless tobacco: Never  Vaping Use   Vaping Use: Never used  Substance and Sexual Activity   Alcohol use: Yes    Alcohol/week: 3.0 standard drinks of alcohol    Types: 3 Cans of beer per week   Drug use: Never   Sexual activity: Not on file  Other Topics Concern   Not on file  Social History Narrative   Not on file   Social Determinants of Health   Financial Resource Strain: Not on file  Food Insecurity: Not on file  Transportation Needs: Not on file  Physical Activity: Not on file  Stress: Not on file  Social Connections: Not on file   Past Surgical History:  Procedure Laterality Date   CORONARY ARTERY BYPASS GRAFT  2012   ESOPHAGOGASTRODUODENOSCOPY N/A 12/13/2021   Procedure: ESOPHAGOGASTRODUODENOSCOPY (EGD);  Surgeon: Diamantina Monks, MD;  Location[facility] ENDOSCOPY;  Service: General;  Laterality: N/A;   IR GASTROSTOMY TUBE REMOVAL  08/29/2022   IR REPLC GASTRO/COLONIC TUBE PERCUT W/FLUORO  01/29/2022   IR REPLC GASTRO/COLONIC TUBE PERCUT W/FLUORO  07/19/2022   KIDNEY STONE SURGERY  06/27/2022   bladder  at [facility]   PEG PLACEMENT  N/A 12/13/2021   Procedure: PERCUTANEOUS ENDOSCOPIC GASTROSTOMY (PEG) PLACEMENT;  Surgeon: Diamantina Monks, MD;  Location: [facility] ENDOSCOPY;  Service: General;  Laterality: N/A;   SKIN SURGERY  09/2022   right shoulder pre-cancer spot removed by in Dr. Ivery Quale in [place]   SUBOCCIPITAL CRANIECTOMY CERVICAL LAMINECTOMY N/A 11/26/2021   Procedure: SUBOCCIPITAL CRANIECTOMY;  Surgeon: Jadene Pierini, MD;  Location: [facility] OR;  Service: Neurosurgery;  Laterality: N/A;   Past Medical History:  Diagnosis Date   CAD (coronary artery disease)    a. s/p CABG in 2012   Hyperlipidemia    BP (!) 165/84   Pulse (!) 56   Ht 5\' 10"  (1.778 m)   Wt 181 lb (82.1 kg)   SpO2 98%   BMI 25.97 kg/m   Opioid Risk Score:   Fall Risk Score:   `1  Depression screen [facility] 2/9     12/18/2022    3:26 PM 09/11/2022    2:43 PM 06/12/2022    1:06 PM 02/22/2022    1:07 PM  Depression screen PHQ 2/9  Decreased Interest 0 0 0 1  Down, Depressed, Hopeless 0 0 0 0  PHQ - 2 Score 0 0 0 1  Altered sleeping    0  Tired, decreased energy    1  Change in appetite    0  Feeling bad or failure about yourself     0  Trouble concentrating    0  Moving slowly or fidgety/restless    0  Suicidal thoughts    0  PHQ-9 Score    2  Difficult doing work/chores    Extremely dIfficult      Review of Systems  Respiratory:  Positive for cough.        Objective:   Physical Exam  General: No acute distress HEENT: NCAT, EOMI, oral membranes moist Cards: reg rate  Chest: normal effort Abdomen: Soft, NT, ND Skin: dry, intact Extremities: no edema Psych: pleasant and appropriate    Skin: intact Neuro: oriented x 3. Alert. Much impeocws insight, awareness, memory and engagement.  Musculoskeletal: Full ROM, No pain with AROM or PROM in the neck, trunk, or extremities. Posture appropriate.improved weight shift on right. Advances right FAIRLY fluidly with crutch.better weight shift.  Ataxia RUE and RLE but contiues to improve. . Better processing today.            Assessment & Plan:    1. 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  Status post posterior decompression with craniectomy 11/26/2021            -resume outpt PT to address higher balance and ambulation.Marland Kitchen advance from crutch?  -continue with forearm cruth.  -may continue driving trials on his property.  2.  -DVT/anticoagulation: eliquis               3. Pain Management:  tylenol 4. Agitation/Mood/sleep:              - celexa   20mg  qhs--  continue for now  -concerned that his mood might be contributing to decreased activity and initiation as  well as ?weight loss 6.  Dysphagia/cough/FEN: f/u mbs  pending -use sauces and gravies, stay away from dry foods/breads/thicker meats -mucinex dm  -encouraged supplemental shake daily to help put on weight. Liberalize diet 7.  New onset atrial fibrillation.  resume eliquis            -Watchman  on hold 8.  Hypertension. Per primary.  9.  History of BPH/urinary retention.                              10.  CAD with CABG times 03/2011 in [place].  Follow-up per cardiology services. 11. Hematuria related to prostate cancer/radiation, recurrent UTI.                 - radiation cystitis,            -s/p stone extraction per urology   -recommend f/u given weight loss   30 minutes of face to face patient care time were spent during this visit. All questions were encouraged and answered.  Follow up with me in 4 MOS .

## 2023-04-19 ENCOUNTER — Other Ambulatory Visit (HOSPITAL_COMMUNITY): Payer: Medicare Other

## 2023-08-20 ENCOUNTER — Encounter: Payer: Self-pay | Admitting: Physical Medicine & Rehabilitation

## 2023-08-20 ENCOUNTER — Encounter: Payer: Medicare Other | Attending: Physical Medicine & Rehabilitation | Admitting: Physical Medicine & Rehabilitation

## 2023-08-20 VITALS — BP 129/69 | HR 51 | Ht 70.0 in | Wt 179.2 lb

## 2023-08-20 DIAGNOSIS — F329 Major depressive disorder, single episode, unspecified: Secondary | ICD-10-CM | POA: Insufficient documentation

## 2023-08-20 DIAGNOSIS — I639 Cerebral infarction, unspecified: Secondary | ICD-10-CM | POA: Diagnosis not present

## 2023-08-20 MED ORDER — MIRTAZAPINE 7.5 MG PO TABS
7.5000 mg | ORAL_TABLET | Freq: Every day | ORAL | 3 refills | Status: DC
Start: 2023-08-20 — End: 2023-12-24

## 2023-08-20 NOTE — Patient Instructions (Addendum)
ALWAYS FEEL FREE TO CALL OUR OFFICE WITH ANY PROBLEMS OR QUESTIONS [phone])  **PLEASE NOTE** ALL MEDICATION REFILL REQUESTS (INCLUDING CONTROLLED SUBSTANCES) NEED TO BE MADE AT LEAST 7 DAYS PRIOR TO REFILL BEING DUE. ANY REFILL REQUESTS INSIDE THAT TIME FRAME MAY RESULT IN DELAYS IN RECEIVING YOUR PRESCRIPTION.    CELEXA: BREAK IN 1/2 TO 10MG  AND TAKE AT BEDTIME REMERON 7.5MG  TABLET AT NIGHT.    CONTACT ME IN ABOUT A MONTH TO LET ME KNOW HOW YOU'RE DOING!!

## 2023-08-20 NOTE — Progress Notes (Signed)
Subjective:    Patient ID: [name], male    DOB: Apr 14, 1946, 77 y.o.   MRN: [id number]  HPI  [name] is here in follow up of his cerebellar infarcts. He has felt that speech has been more slurred over the last few months. He went for MBS on 04/23/23 which revealed:   --Trace laryngeal penetration was noted with thin liquids. No evidence of aspiration.   --Small volume residue within the vallecula following swallows of all tested consistencies.    He also has had recurrence of his prostate cancer in the prostate and some lymph nodes seen on PET scan. Oncololgy/urology is watching closely at this point. There are risk factors with further radiation and hormone based therapy.   He joined the gym and has worked out a few times so far.   From a nutritional standpoint. He's trying to eat more but his appetite is not great. He needs to be prodded to eat.   His mood has been ok. He remains on celexa.    Pain Inventory Average Pain 0 Pain Right Now 0 My pain is  none at this time  In the last 24 hours, has pain interfered with the following? General activity 0 Relation with others 0 Enjoyment of life 0 What TIME of day is your pain at its worst? varies Sleep (in general) Good  Pain is worse with:  N/A Pain improves with:  N/A Relief from Meds:  N/A  Family History  Problem Relation Age of Onset   Cancer Brother    Coronary artery disease Brother    Social History   Socioeconomic History   Marital status: Married    Spouse name: Not on file   Number of children: Not on file   Years of education: Not on file   Highest education level: Not on file  Occupational History   Not on file  Tobacco Use   Smoking status: Former    Types: Cigarettes   Smokeless tobacco: Never  Vaping Use   Vaping status: Never Used  Substance and Sexual Activity   Alcohol use: Yes    Alcohol/week: 3.0 standard drinks of alcohol    Types: 3 Cans of beer per week   Drug use: Never    Sexual activity: Not on file  Other Topics Concern   Not on file  Social History Narrative   Not on file   Social Determinants of Health   Financial Resource Strain: Low Risk  (01/02/2023)   Received from [facility], [facility]   Overall Financial Resource Strain (CARDIA)    Difficulty of Paying Living Expenses: Not hard at all  Food Insecurity: No Food Insecurity (01/02/2023)   Received from [facility], [facility]   Hunger Vital Sign    Worried About Running Out of Food in the Last Year: Never true    Ran Out of Food in the Last Year: Never true  Transportation Needs: No Transportation Needs (01/02/2023)   Received from [facility], [facility]   [facility] - Transportation    Lack of Transportation (Medical): No    Lack of Transportation (Non-Medical): No  Physical Activity: Insufficiently Active (02/26/2021)   Received from [facility], [facility]   Exercise Vital Sign    Days of Exercise per Week: 3 days    Minutes of Exercise per Session: 30 min  Stress: No Stress Concern Present (01/10/2020)   Received from [facility], [facility]  Care   [employer] of Occupational Health - Occupational Stress Questionnaire    Feeling of Stress : Only a little  Social Connections: Moderately Integrated (01/10/2020)   Received from [facility], [facility]   Social Connection and Isolation Panel [NHANES]    Frequency of Communication with Friends and Family: More than three times a week    Frequency of Social Gatherings with Friends and Family: Once a week    Attends Religious Services: More than 4 times per year    Active Member of [employer] or Organizations: No    Attends Banker Meetings: Never    Marital Status: Married   Past Surgical History:  Procedure Laterality Date   CORONARY ARTERY BYPASS GRAFT  2012   ESOPHAGOGASTRODUODENOSCOPY N/A 12/13/2021   Procedure: ESOPHAGOGASTRODUODENOSCOPY (EGD);  Surgeon: Diamantina Monks,  MD;  Location: [facility] ENDOSCOPY;  Service: General;  Laterality: N/A;   IR GASTROSTOMY TUBE REMOVAL  08/29/2022   IR REPLC GASTRO/COLONIC TUBE PERCUT W/FLUORO  01/29/2022   IR REPLC GASTRO/COLONIC TUBE PERCUT W/FLUORO  07/19/2022   KIDNEY STONE SURGERY  06/27/2022   bladder  at [facility]   PEG PLACEMENT N/A 12/13/2021   Procedure: PERCUTANEOUS ENDOSCOPIC GASTROSTOMY (PEG) PLACEMENT;  Surgeon: Diamantina Monks, MD;  Location: [facility] ENDOSCOPY;  Service: General;  Laterality: N/A;   SKIN SURGERY  09/2022   right shoulder pre-cancer spot removed by in Dr. Ivery Quale in [place]   SUBOCCIPITAL CRANIECTOMY CERVICAL LAMINECTOMY N/A 11/26/2021   Procedure: SUBOCCIPITAL CRANIECTOMY;  Surgeon: Jadene Pierini, MD;  Location: [facility] OR;  Service: Neurosurgery;  Laterality: N/A;   Past Surgical History:  Procedure Laterality Date   CORONARY ARTERY BYPASS GRAFT  2012   ESOPHAGOGASTRODUODENOSCOPY N/A 12/13/2021   Procedure: ESOPHAGOGASTRODUODENOSCOPY (EGD);  Surgeon: Diamantina Monks, MD;  Location: [facility] ENDOSCOPY;  Service: General;  Laterality: N/A;   IR GASTROSTOMY TUBE REMOVAL  08/29/2022   IR REPLC GASTRO/COLONIC TUBE PERCUT W/FLUORO  01/29/2022   IR REPLC GASTRO/COLONIC TUBE PERCUT W/FLUORO  07/19/2022   KIDNEY STONE SURGERY  06/27/2022   bladder  at [facility]   PEG PLACEMENT N/A 12/13/2021   Procedure: PERCUTANEOUS ENDOSCOPIC GASTROSTOMY (PEG) PLACEMENT;  Surgeon: Diamantina Monks, MD;  Location: [facility] ENDOSCOPY;  Service: General;  Laterality: N/A;   SKIN SURGERY  09/2022   right shoulder pre-cancer spot removed by in Dr. Ivery Quale in [place]   SUBOCCIPITAL CRANIECTOMY CERVICAL LAMINECTOMY N/A 11/26/2021   Procedure: SUBOCCIPITAL CRANIECTOMY;  Surgeon: Jadene Pierini, MD;  Location: [facility] OR;  Service: Neurosurgery;  Laterality: N/A;   Past Medical History:  Diagnosis Date   CAD (coronary artery disease)    a. s/p CABG in 2012   Hyperlipidemia    BP 129/69   Pulse (!) 51   Ht 5\' 10"  (1.778 m)    Wt 179 lb 3.2 oz (81.3 kg)   SpO2 98%   BMI 25.71 kg/m   Opioid Risk Score:   Fall Risk Score:  `1  Depression screen [facility] 2/9     08/20/2023    3:13 PM 12/18/2022    3:26 PM 09/11/2022    2:43 PM 06/12/2022    1:06 PM 02/22/2022    1:07 PM  Depression screen PHQ 2/9  Decreased Interest 0 0 0 0 1  Down, Depressed, Hopeless 0 0 0 0 0  PHQ - 2 Score 0 0 0 0 1  Altered sleeping     0  Tired, decreased energy  1  Change in appetite     0  Feeling bad or failure about yourself      0  Trouble concentrating     0  Moving slowly or fidgety/restless     0  Suicidal thoughts     0  PHQ-9 Score     2  Difficult doing work/chores     Extremely dIfficult     Review of Systems  Musculoskeletal:  Positive for gait problem.  All other systems reviewed and are negative.     Objective:   Physical Exam  General: No acute distress HEENT: NCAT, EOMI, oral membranes moist, Cards: reg rate  Chest: normal effort Abdomen: Soft, NT, ND Skin: dry, intact Extremities: no edema Psych: pleasant and appropriate   Skin: intact Neuro: oriented x 3. Alert. Much improved insight, awareness, memory and engagement. Speech volume still variable and speech can be ataxic, dysarthric Musculoskeletal: Full ROM, No pain with AROM or PROM in the neck, trunk, or extremities. Posture appropriate.improved weight shift on right. Advances  FAIRLY fluidly with crutch.better weight shift.  Ataxia RUE and RLE is still present. .             Assessment & Plan:    1. 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  Status post posterior decompression with craniectomy 11/26/2021            -continue with HEP. Encourage gym membership and regular walking 2.  -DVT/anticoagulation: eliquis               3.Phonation/dysarthria:  -ataxic speech--will make referral to speech to speech therapy at [facility] closer to pt's home in  [place]  4. Agitation/Mood/sleep:              -continue with celexa but reduce to 10mg   -begin remeron 7.5mg  at bedtime to help appetite, may be able to transition off celexa  6.  Dysphagia/cough/FEN: f/u mbs without any major concerns -use sauces and gravies, stay away from dry foods/breads/thicker meats -mucinex dm   -SLP can look at swallowing as well. 7.  New onset atrial fibrillation.  resumed eliquis            -Watchman on hold 8.  Hypertension. Per primary.  9.  Prostate cancer:                            -observation and plan per urology/oncology, see below 10.  CAD with CABG times 03/2011 in [place].  Follow-up per cardiology services. 11. Hematuria related to prostate cancer/radiation, recurrent UTI.                 - radiation cystitis,            -s/p stone extraction per urology                 30 minutes of face to face patient care time were spent during this visit. All questions were encouraged and answered.  Follow up with me in 4 MOS .

## 2023-09-11 ENCOUNTER — Encounter: Payer: Self-pay | Admitting: Physical Medicine & Rehabilitation

## 2023-12-24 ENCOUNTER — Encounter: Payer: Medicare Other | Attending: Physical Medicine & Rehabilitation | Admitting: Physical Medicine & Rehabilitation

## 2023-12-24 ENCOUNTER — Encounter: Payer: Self-pay | Admitting: Physical Medicine & Rehabilitation

## 2023-12-24 VITALS — BP 161/79 | HR 57 | Ht 70.0 in | Wt 186.0 lb

## 2023-12-24 DIAGNOSIS — I639 Cerebral infarction, unspecified: Secondary | ICD-10-CM | POA: Diagnosis not present

## 2023-12-24 DIAGNOSIS — R270 Ataxia, unspecified: Secondary | ICD-10-CM | POA: Diagnosis present

## 2023-12-24 DIAGNOSIS — R310 Gross hematuria: Secondary | ICD-10-CM | POA: Diagnosis present

## 2023-12-24 NOTE — Patient Instructions (Signed)
 ALWAYS FEEL FREE TO CALL OUR OFFICE WITH ANY PROBLEMS OR QUESTIONS [phone])  **PLEASE NOTE** ALL MEDICATION REFILL REQUESTS (INCLUDING CONTROLLED SUBSTANCES) NEED TO BE MADE AT LEAST 7 DAYS PRIOR TO REFILL BEING DUE. ANY REFILL REQUESTS INSIDE THAT TIME FRAME MAY RESULT IN DELAYS IN RECEIVING YOUR PRESCRIPTION.     !!!!!HAPPY NEW YEAR!!!!!

## 2023-12-24 NOTE — Progress Notes (Signed)
 Subjective:    Patient ID: [name], male    DOB: 1946-11-11, 78 y.o.   MRN: [id number]  HPI  [name] is here in follow up of his cerebellar infarct. Apparently he had a fall over the holidays (12/30) and injured his right knee when he tripped near a tree. He still has some stiffness and swelling in the right knee which seems to be improving.   Otherwise he had a pretty good holidays. He had been more active this past Fall going to the gym working on strengthening exercises. He doesn't do any aerobic activities at the gym. He does like to walk around his house.   From a prostate standpoint he has had some intermittent hematuria. His PSA has been rising. He has more cancer metastases on a recent CT. He is followed by oncology and they will see him again on February. His options unfortunately are limited.   He had nightmares from mirtazapine  so it was stopped. His weight has been stable although he's leaner. He remains on celexa  for his mood.    Pain Inventory Average Pain 3 Pain Right Now 3 My pain is constant, dull, and aching  LOCATION OF PAIN  right knee pain  BOWEL Number of stools per week: 28  BLADDER Normal    Mobility use a cane ability to climb steps?  yes do you drive?  no Do you have any goals in this area?  yes  Function retired I need assistance with the following:  household duties and shopping Do you have any goals in this area?  yes  Neuro/Psych weakness tremor trouble walking spasms confusion  Prior Studies Any changes since last visit?  no  Physicians involved in your care Any changes since last visit?  no.   Family History  Problem Relation Age of Onset   Cancer Brother    Coronary artery disease Brother    Social History   Socioeconomic History   Marital status: Married    Spouse name: Not on file   Number of children: Not on file   Years of education: Not on file   Highest education level: Not on file  Occupational  History   Not on file  Tobacco Use   Smoking status: Former    Types: Cigarettes   Smokeless tobacco: Never  Vaping Use   Vaping status: Never Used  Substance and Sexual Activity   Alcohol use: Yes    Alcohol/week: 3.0 standard drinks of alcohol    Types: 3 Cans of beer per week   Drug use: Never   Sexual activity: Not on file  Other Topics Concern   Not on file  Social History Narrative   Not on file   Social Drivers of Health   Financial Resource Strain: Low Risk  (01/02/2023)   Received [facility], [facility]   Overall Financial Resource Strain (CARDIA)    Difficulty of Paying Living Expenses: Not hard at all  Food Insecurity: No Food Insecurity (01/02/2023)   Received [facility]   Hunger Vital Sign    Worried About Running Out of Food in the Last Year: Never true    Ran Out of Food in the Last Year: Never true  Transportation Needs: No Transportation Needs (01/02/2023)   Received from [facility], [facility]   [facility] - Transportation    Lack of Transportation (Medical): No    Lack of Transportation (Non-Medical): No  Physical  Activity: Insufficiently Active (02/26/2021)   Received from [facility], [facility]   Exercise Vital Sign    Days of Exercise per Week: 3 days    Minutes of Exercise per Session: 30 min  Stress: No Stress Concern Present (01/10/2020)   Received from [facility], [facility] of Occupational Health - Occupational Stress Questionnaire    Feeling of Stress : Only a little  Social Connections: Moderately Integrated (01/10/2020)   Received from [facility], [facility]   Social Connection and Isolation Panel [NHANES]    Frequency of Communication with Friends and Family: More than three times a week    Frequency of Social Gatherings with Friends and Family: Once a week    Attends Religious Services: More than 4 times per year    Active Member of [employer] or  Organizations: No    Attends Banker Meetings: Never    Marital Status: Married   Past Surgical History:  Procedure Laterality Date   CORONARY ARTERY BYPASS GRAFT  2012   ESOPHAGOGASTRODUODENOSCOPY N/A 12/13/2021   Procedure: ESOPHAGOGASTRODUODENOSCOPY (EGD);  Surgeon: Paola Dreama SAILOR, MD;  Location: [facility] ENDOSCOPY;  Service: General;  Laterality: N/A;   IR GASTROSTOMY TUBE REMOVAL  08/29/2022   IR REPLC GASTRO/COLONIC TUBE PERCUT W/FLUORO  01/29/2022   IR REPLC GASTRO/COLONIC TUBE PERCUT W/FLUORO  07/19/2022   KIDNEY STONE SURGERY  06/27/2022   bladder  at [facility]   PEG PLACEMENT N/A 12/13/2021   Procedure: PERCUTANEOUS ENDOSCOPIC GASTROSTOMY (PEG) PLACEMENT;  Surgeon: Paola Dreama SAILOR, MD;  Location: [facility] ENDOSCOPY;  Service: General;  Laterality: N/A;   SKIN SURGERY  09/2022   right shoulder pre-cancer spot removed by in Dr. Alm Destine in [place]   SUBOCCIPITAL CRANIECTOMY CERVICAL LAMINECTOMY N/A 11/26/2021   Procedure: SUBOCCIPITAL CRANIECTOMY;  Surgeon: Cheryle Debby LABOR, MD;  Location: [facility] OR;  Service: Neurosurgery;  Laterality: N/A;   Past Medical History:  Diagnosis Date   CAD (coronary artery disease)    a. s/p CABG in 2012   Hyperlipidemia    Ht 5' 10 (1.778 m)   Wt 186 lb (84.4 kg)   BMI 26.69 kg/m   Opioid Risk Score:   Fall Risk Score:  `1  Depression screen [facility] 2/9     12/24/2023    1:03 PM 08/20/2023    3:13 PM 12/18/2022    3:26 PM 09/11/2022    2:43 PM 06/12/2022    1:06 PM 02/22/2022    1:07 PM  Depression screen PHQ 2/9  Decreased Interest 0 0 0 0 0 1  Down, Depressed, Hopeless 0 0 0 0 0 0  PHQ - 2 Score 0 0 0 0 0 1  Altered sleeping      0  Tired, decreased energy      1  Change in appetite      0  Feeling bad or failure about yourself       0  Trouble concentrating      0  Moving slowly or fidgety/restless      0  Suicidal thoughts      0  PHQ-9 Score      2  Difficult doing work/chores      Extremely dIfficult    Review of  Systems  Musculoskeletal:  Positive for gait problem.       Right knee pain  Neurological:  Positive for weakness and numbness.       Spasms  All other systems reviewed and are negative.      Objective:   Physical Exam  General: No acute distress HEENT: NCAT, EOMI, oral membranes moist Cards: reg rate  Chest: normal effort Abdomen: Soft, NT, ND Skin: dry, intact Extremities: no edema Psych: pleasant and appropriate    Skin: intact Neuro: oriented x 3. Alert. Much improved insight, awareness, memory and engagement. Speech volume still variable and speech can be ataxic, still dysarthric Musculoskeletal: Full ROM, No pain with AROM or PROM in the neck, trunk, or extremities. Posture appropriate.gait wide based with loftstrand crutch. Ataxia RUE and RLE is still present with some improvement.              Assessment & Plan:    1. 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  Status post posterior decompression with craniectomy 11/26/2021            -HEP, gym as possible 2.  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 Agitation/Mood/sleep:              -continue with celexa  but reduce to 67m 5.  Dysphagia/cough/FEN: f/u mbs without any major concerns -tolerating diet 7.  New onset atrial fibrillation.  resumed eliquis --has intermittent bleeding            -Watchman on hold 8.  Hypertension. Per primary.  9.  Prostate cancer:                            -observation and plan per urology/oncology, see below  -f/u with oncology in February 10.  CAD with CABG times 03/2011 in [place] .  Follow-up per cardiology services. 11. Hematuria related to prostate cancer/radiation, recurrent UTI.                 - radiation cystitis,                             30 minutes of face to face patient  care time were spent during this visit. All questions were encouraged and answered.  Follow up with me PRN  .
